# Patient Record
Sex: Female | Born: 1960
Health system: Southern US, Community
[De-identification: ages and names within clinical notes are randomized; demographics above are authoritative.]

## PROBLEM LIST (undated history)

## (undated) DIAGNOSIS — R569 Unspecified convulsions: Secondary | ICD-10-CM

## (undated) DIAGNOSIS — J449 Chronic obstructive pulmonary disease, unspecified: Secondary | ICD-10-CM

## (undated) DIAGNOSIS — F419 Anxiety disorder, unspecified: Secondary | ICD-10-CM

## (undated) DIAGNOSIS — E669 Obesity, unspecified: Secondary | ICD-10-CM

## (undated) DIAGNOSIS — Z9889 Other specified postprocedural states: Secondary | ICD-10-CM

## (undated) DIAGNOSIS — F32A Depression, unspecified: Secondary | ICD-10-CM

## (undated) DIAGNOSIS — Z96652 Presence of left artificial knee joint: Secondary | ICD-10-CM

## (undated) DIAGNOSIS — K631 Perforation of intestine (nontraumatic): Secondary | ICD-10-CM

## (undated) DIAGNOSIS — G8929 Other chronic pain: Secondary | ICD-10-CM

## (undated) DIAGNOSIS — K219 Gastro-esophageal reflux disease without esophagitis: Secondary | ICD-10-CM

## (undated) DIAGNOSIS — G4730 Sleep apnea, unspecified: Secondary | ICD-10-CM

## (undated) DIAGNOSIS — E7800 Pure hypercholesterolemia, unspecified: Secondary | ICD-10-CM

## (undated) DIAGNOSIS — R0602 Shortness of breath: Secondary | ICD-10-CM

## (undated) DIAGNOSIS — K7460 Unspecified cirrhosis of liver: Secondary | ICD-10-CM

## (undated) DIAGNOSIS — K5901 Slow transit constipation: Secondary | ICD-10-CM

## (undated) DIAGNOSIS — R51 Headache: Secondary | ICD-10-CM

## (undated) DIAGNOSIS — Z79899 Other long term (current) drug therapy: Secondary | ICD-10-CM

## (undated) DIAGNOSIS — J441 Chronic obstructive pulmonary disease with (acute) exacerbation: Secondary | ICD-10-CM

## (undated) DIAGNOSIS — I152 Hypertension secondary to endocrine disorders: Secondary | ICD-10-CM

## (undated) DIAGNOSIS — Z720 Tobacco use: Secondary | ICD-10-CM

## (undated) DIAGNOSIS — I10 Essential (primary) hypertension: Secondary | ICD-10-CM

## (undated) DIAGNOSIS — M4310 Spondylolisthesis, site unspecified: Secondary | ICD-10-CM

## (undated) DIAGNOSIS — E1169 Type 2 diabetes mellitus with other specified complication: Secondary | ICD-10-CM

## (undated) DIAGNOSIS — J181 Lobar pneumonia, unspecified organism: Secondary | ICD-10-CM

## (undated) DIAGNOSIS — J45909 Unspecified asthma, uncomplicated: Secondary | ICD-10-CM

## (undated) DIAGNOSIS — D509 Iron deficiency anemia, unspecified: Secondary | ICD-10-CM

## (undated) DIAGNOSIS — J101 Influenza due to other identified influenza virus with other respiratory manifestations: Secondary | ICD-10-CM

## (undated) DIAGNOSIS — J9621 Acute and chronic respiratory failure with hypoxia: Secondary | ICD-10-CM

## (undated) DIAGNOSIS — M1990 Unspecified osteoarthritis, unspecified site: Secondary | ICD-10-CM

## (undated) DIAGNOSIS — G4733 Obstructive sleep apnea (adult) (pediatric): Secondary | ICD-10-CM

## (undated) DIAGNOSIS — M5442 Lumbago with sciatica, left side: Secondary | ICD-10-CM

## (undated) DIAGNOSIS — R112 Nausea with vomiting, unspecified: Secondary | ICD-10-CM

## (undated) DIAGNOSIS — F329 Major depressive disorder, single episode, unspecified: Secondary | ICD-10-CM

## (undated) DIAGNOSIS — K567 Ileus, unspecified: Secondary | ICD-10-CM

## (undated) DIAGNOSIS — R1084 Generalized abdominal pain: Secondary | ICD-10-CM

## (undated) DIAGNOSIS — T8131XA Disruption of external operation (surgical) wound, not elsewhere classified, initial encounter: Secondary | ICD-10-CM

## (undated) DIAGNOSIS — J342 Deviated nasal septum: Secondary | ICD-10-CM

## (undated) DIAGNOSIS — E1159 Type 2 diabetes mellitus with other circulatory complications: Secondary | ICD-10-CM

## (undated) DIAGNOSIS — G43909 Migraine, unspecified, not intractable, without status migrainosus: Secondary | ICD-10-CM

## (undated) DIAGNOSIS — R188 Other ascites: Secondary | ICD-10-CM

## (undated) DIAGNOSIS — E119 Type 2 diabetes mellitus without complications: Secondary | ICD-10-CM

## (undated) DIAGNOSIS — R109 Unspecified abdominal pain: Secondary | ICD-10-CM

## (undated) DIAGNOSIS — J9620 Acute and chronic respiratory failure, unspecified whether with hypoxia or hypercapnia: Secondary | ICD-10-CM

## (undated) DIAGNOSIS — F418 Other specified anxiety disorders: Secondary | ICD-10-CM

## (undated) DIAGNOSIS — M4300 Spondylolysis, site unspecified: Secondary | ICD-10-CM

## (undated) DIAGNOSIS — I2510 Atherosclerotic heart disease of native coronary artery without angina pectoris: Secondary | ICD-10-CM

## (undated) DIAGNOSIS — E785 Hyperlipidemia, unspecified: Secondary | ICD-10-CM

## (undated) DIAGNOSIS — F1190 Opioid use, unspecified, uncomplicated: Secondary | ICD-10-CM

## (undated) HISTORY — DX: Essential (primary) hypertension: I10

## (undated) HISTORY — DX: Gastro-esophageal reflux disease without esophagitis: K21.9

## (undated) HISTORY — PX: ABDOMINAL EXPLORATION SURGERY: SHX538

## (undated) HISTORY — PX: OTHER SURGICAL HISTORY: SHX169

## (undated) HISTORY — DX: Lumbago with sciatica, left side: M54.42

## (undated) HISTORY — DX: Tobacco use: Z72.0

## (undated) HISTORY — DX: Obesity, unspecified: E66.9

## (undated) HISTORY — PX: ABDOMINAL HYSTERECTOMY: SHX81

## (undated) HISTORY — PX: BACK SURGERY: SHX140

## (undated) HISTORY — DX: Other chronic pain: G89.29

## (undated) HISTORY — DX: Atherosclerotic heart disease of native coronary artery without angina pectoris: I25.10

## (undated) HISTORY — DX: Chronic obstructive pulmonary disease, unspecified: J44.9

---
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
## 1999-10-29 ENCOUNTER — Ambulatory Visit: Admission: RE | Admit: 1999-10-29 | Discharge: 1999-10-29 | Payer: Self-pay | Admitting: Family Medicine

## 2000-02-06 ENCOUNTER — Ambulatory Visit (HOSPITAL_COMMUNITY): Admission: RE | Admit: 2000-02-06 | Discharge: 2000-02-06 | Payer: Self-pay | Admitting: *Deleted

## 2000-02-06 ENCOUNTER — Encounter: Payer: Self-pay | Admitting: *Deleted

## 2000-10-13 ENCOUNTER — Encounter: Payer: Self-pay | Admitting: Specialist

## 2000-10-14 ENCOUNTER — Observation Stay (HOSPITAL_COMMUNITY): Admission: RE | Admit: 2000-10-14 | Discharge: 2000-10-15 | Payer: Self-pay | Admitting: Specialist

## 2000-10-14 ENCOUNTER — Encounter: Payer: Self-pay | Admitting: Specialist

## 2000-10-14 ENCOUNTER — Encounter (INDEPENDENT_AMBULATORY_CARE_PROVIDER_SITE_OTHER): Payer: Self-pay | Admitting: Specialist

## 2000-12-07 ENCOUNTER — Encounter: Admission: RE | Admit: 2000-12-07 | Discharge: 2000-12-07 | Payer: Self-pay | Admitting: Obstetrics and Gynecology

## 2000-12-07 ENCOUNTER — Encounter: Payer: Self-pay | Admitting: Obstetrics and Gynecology

## 2001-05-30 ENCOUNTER — Encounter: Payer: Self-pay | Admitting: Emergency Medicine

## 2001-05-30 ENCOUNTER — Emergency Department (HOSPITAL_COMMUNITY): Admission: EM | Admit: 2001-05-30 | Discharge: 2001-05-31 | Payer: Self-pay | Admitting: Emergency Medicine

## 2001-07-02 ENCOUNTER — Encounter: Payer: Self-pay | Admitting: Surgery

## 2001-07-02 ENCOUNTER — Encounter: Admission: RE | Admit: 2001-07-02 | Discharge: 2001-07-02 | Payer: Self-pay | Admitting: Surgery

## 2001-08-19 ENCOUNTER — Ambulatory Visit (HOSPITAL_COMMUNITY): Admission: RE | Admit: 2001-08-19 | Discharge: 2001-08-19 | Payer: Self-pay | Admitting: Gastroenterology

## 2001-08-27 ENCOUNTER — Emergency Department (HOSPITAL_COMMUNITY): Admission: EM | Admit: 2001-08-27 | Discharge: 2001-08-27 | Payer: Self-pay | Admitting: Emergency Medicine

## 2001-08-27 ENCOUNTER — Encounter: Payer: Self-pay | Admitting: Emergency Medicine

## 2001-09-02 ENCOUNTER — Encounter: Admission: RE | Admit: 2001-09-02 | Discharge: 2001-09-02 | Payer: Self-pay | Admitting: Gastroenterology

## 2001-09-02 ENCOUNTER — Encounter: Payer: Self-pay | Admitting: Gastroenterology

## 2001-10-01 ENCOUNTER — Encounter: Admission: RE | Admit: 2001-10-01 | Discharge: 2001-10-01 | Payer: Self-pay | Admitting: Surgery

## 2001-10-01 ENCOUNTER — Encounter: Payer: Self-pay | Admitting: Surgery

## 2002-02-01 ENCOUNTER — Ambulatory Visit (HOSPITAL_COMMUNITY): Admission: RE | Admit: 2002-02-01 | Discharge: 2002-02-01 | Payer: Self-pay | Admitting: *Deleted

## 2002-02-01 ENCOUNTER — Encounter: Payer: Self-pay | Admitting: *Deleted

## 2002-02-10 ENCOUNTER — Encounter: Payer: Self-pay | Admitting: Family Medicine

## 2002-02-10 ENCOUNTER — Ambulatory Visit (HOSPITAL_COMMUNITY): Admission: RE | Admit: 2002-02-10 | Discharge: 2002-02-10 | Payer: Self-pay | Admitting: Family Medicine

## 2003-08-31 ENCOUNTER — Ambulatory Visit (HOSPITAL_COMMUNITY): Admission: RE | Admit: 2003-08-31 | Discharge: 2003-08-31 | Payer: Self-pay | Admitting: Family Medicine

## 2003-09-18 ENCOUNTER — Emergency Department (HOSPITAL_COMMUNITY): Admission: EM | Admit: 2003-09-18 | Discharge: 2003-09-18 | Payer: Self-pay | Admitting: Emergency Medicine

## 2003-09-22 ENCOUNTER — Ambulatory Visit (HOSPITAL_COMMUNITY): Admission: RE | Admit: 2003-09-22 | Discharge: 2003-09-22 | Payer: Self-pay | Admitting: Gastroenterology

## 2004-05-08 ENCOUNTER — Other Ambulatory Visit: Admission: RE | Admit: 2004-05-08 | Discharge: 2004-05-08 | Payer: Self-pay | Admitting: Family Medicine

## 2005-09-11 ENCOUNTER — Emergency Department (HOSPITAL_COMMUNITY): Admission: EM | Admit: 2005-09-11 | Discharge: 2005-09-11 | Payer: Self-pay | Admitting: Emergency Medicine

## 2005-10-12 ENCOUNTER — Emergency Department (HOSPITAL_COMMUNITY): Admission: EM | Admit: 2005-10-12 | Discharge: 2005-10-12 | Payer: Self-pay | Admitting: Family Medicine

## 2006-08-15 ENCOUNTER — Emergency Department (HOSPITAL_COMMUNITY): Admission: EM | Admit: 2006-08-15 | Discharge: 2006-08-15 | Payer: Self-pay | Admitting: Family Medicine

## 2006-08-16 ENCOUNTER — Emergency Department (HOSPITAL_COMMUNITY): Admission: EM | Admit: 2006-08-16 | Discharge: 2006-08-16 | Payer: Self-pay | Admitting: Emergency Medicine

## 2006-08-20 ENCOUNTER — Emergency Department (HOSPITAL_COMMUNITY): Admission: EM | Admit: 2006-08-20 | Discharge: 2006-08-20 | Payer: Self-pay | Admitting: Family Medicine

## 2006-09-27 ENCOUNTER — Emergency Department (HOSPITAL_COMMUNITY): Admission: EM | Admit: 2006-09-27 | Discharge: 2006-09-27 | Payer: Self-pay | Admitting: Emergency Medicine

## 2006-10-31 ENCOUNTER — Emergency Department (HOSPITAL_COMMUNITY): Admission: EM | Admit: 2006-10-31 | Discharge: 2006-10-31 | Payer: Self-pay | Admitting: Family Medicine

## 2006-11-07 ENCOUNTER — Emergency Department (HOSPITAL_COMMUNITY): Admission: EM | Admit: 2006-11-07 | Discharge: 2006-11-07 | Payer: Self-pay | Admitting: Emergency Medicine

## 2006-12-16 ENCOUNTER — Ambulatory Visit (HOSPITAL_COMMUNITY): Admission: RE | Admit: 2006-12-16 | Discharge: 2006-12-16 | Payer: Self-pay | Admitting: *Deleted

## 2007-02-18 ENCOUNTER — Emergency Department (HOSPITAL_COMMUNITY): Admission: EM | Admit: 2007-02-18 | Discharge: 2007-02-18 | Payer: Self-pay | Admitting: Emergency Medicine

## 2007-05-11 ENCOUNTER — Ambulatory Visit: Payer: Self-pay | Admitting: Physical Medicine and Rehabilitation

## 2007-05-11 ENCOUNTER — Encounter
Admission: RE | Admit: 2007-05-11 | Discharge: 2007-05-12 | Payer: Self-pay | Admitting: Physical Medicine and Rehabilitation

## 2007-05-12 ENCOUNTER — Emergency Department (HOSPITAL_COMMUNITY): Admission: EM | Admit: 2007-05-12 | Discharge: 2007-05-12 | Payer: Self-pay | Admitting: Emergency Medicine

## 2007-05-25 ENCOUNTER — Emergency Department (HOSPITAL_COMMUNITY): Admission: EM | Admit: 2007-05-25 | Discharge: 2007-05-25 | Payer: Self-pay | Admitting: Family Medicine

## 2007-09-05 ENCOUNTER — Emergency Department (HOSPITAL_COMMUNITY): Admission: EM | Admit: 2007-09-05 | Discharge: 2007-09-05 | Payer: Self-pay | Admitting: Emergency Medicine

## 2008-04-03 ENCOUNTER — Emergency Department (HOSPITAL_COMMUNITY): Admission: EM | Admit: 2008-04-03 | Discharge: 2008-04-04 | Payer: Self-pay | Admitting: Emergency Medicine

## 2008-04-16 ENCOUNTER — Emergency Department (HOSPITAL_COMMUNITY): Admission: EM | Admit: 2008-04-16 | Discharge: 2008-04-16 | Payer: Self-pay | Admitting: Family Medicine

## 2008-04-30 ENCOUNTER — Inpatient Hospital Stay (HOSPITAL_COMMUNITY): Admission: EM | Admit: 2008-04-30 | Discharge: 2008-05-03 | Payer: Self-pay | Admitting: Emergency Medicine

## 2008-05-12 ENCOUNTER — Ambulatory Visit (HOSPITAL_COMMUNITY): Admission: RE | Admit: 2008-05-12 | Discharge: 2008-05-12 | Payer: Self-pay | Admitting: Gastroenterology

## 2008-05-30 ENCOUNTER — Emergency Department (HOSPITAL_COMMUNITY): Admission: EM | Admit: 2008-05-30 | Discharge: 2008-05-30 | Payer: Self-pay | Admitting: Internal Medicine

## 2008-06-02 ENCOUNTER — Emergency Department (HOSPITAL_COMMUNITY): Admission: EM | Admit: 2008-06-02 | Discharge: 2008-06-02 | Payer: Self-pay | Admitting: Emergency Medicine

## 2008-06-15 ENCOUNTER — Encounter (INDEPENDENT_AMBULATORY_CARE_PROVIDER_SITE_OTHER): Payer: Self-pay | Admitting: Gastroenterology

## 2008-06-15 ENCOUNTER — Ambulatory Visit (HOSPITAL_COMMUNITY): Admission: RE | Admit: 2008-06-15 | Discharge: 2008-06-15 | Payer: Self-pay | Admitting: Gastroenterology

## 2008-08-23 ENCOUNTER — Emergency Department (HOSPITAL_COMMUNITY): Admission: EM | Admit: 2008-08-23 | Discharge: 2008-08-24 | Payer: Self-pay | Admitting: Emergency Medicine

## 2008-08-28 ENCOUNTER — Emergency Department (HOSPITAL_COMMUNITY): Admission: EM | Admit: 2008-08-28 | Discharge: 2008-08-29 | Payer: Self-pay | Admitting: Emergency Medicine

## 2008-10-20 ENCOUNTER — Emergency Department (HOSPITAL_COMMUNITY): Admission: EM | Admit: 2008-10-20 | Discharge: 2008-10-20 | Payer: Self-pay | Admitting: Emergency Medicine

## 2008-11-24 ENCOUNTER — Emergency Department (HOSPITAL_COMMUNITY): Admission: EM | Admit: 2008-11-24 | Discharge: 2008-11-24 | Payer: Self-pay | Admitting: Emergency Medicine

## 2009-01-05 ENCOUNTER — Inpatient Hospital Stay (HOSPITAL_COMMUNITY): Admission: EM | Admit: 2009-01-05 | Discharge: 2009-01-06 | Payer: Self-pay | Admitting: Emergency Medicine

## 2009-01-15 ENCOUNTER — Inpatient Hospital Stay (HOSPITAL_BASED_OUTPATIENT_CLINIC_OR_DEPARTMENT_OTHER): Admission: RE | Admit: 2009-01-15 | Discharge: 2009-01-15 | Payer: Self-pay | Admitting: Cardiology

## 2009-01-24 ENCOUNTER — Inpatient Hospital Stay (HOSPITAL_COMMUNITY): Admission: AD | Admit: 2009-01-24 | Discharge: 2009-01-25 | Payer: Self-pay | Admitting: Interventional Cardiology

## 2009-03-10 ENCOUNTER — Emergency Department (HOSPITAL_COMMUNITY): Admission: EM | Admit: 2009-03-10 | Discharge: 2009-03-10 | Payer: Self-pay | Admitting: Emergency Medicine

## 2009-03-29 ENCOUNTER — Emergency Department (HOSPITAL_COMMUNITY): Admission: EM | Admit: 2009-03-29 | Discharge: 2009-03-29 | Payer: Self-pay | Admitting: Emergency Medicine

## 2009-04-25 ENCOUNTER — Emergency Department (HOSPITAL_COMMUNITY): Admission: EM | Admit: 2009-04-25 | Discharge: 2009-04-25 | Payer: Self-pay | Admitting: Emergency Medicine

## 2009-05-17 ENCOUNTER — Emergency Department (HOSPITAL_COMMUNITY): Admission: EM | Admit: 2009-05-17 | Discharge: 2009-05-17 | Payer: Self-pay | Admitting: Emergency Medicine

## 2009-06-26 ENCOUNTER — Emergency Department (HOSPITAL_COMMUNITY): Admission: EM | Admit: 2009-06-26 | Discharge: 2009-06-26 | Payer: Self-pay | Admitting: Emergency Medicine

## 2009-06-29 ENCOUNTER — Emergency Department (HOSPITAL_COMMUNITY): Admission: EM | Admit: 2009-06-29 | Discharge: 2009-06-29 | Payer: Self-pay | Admitting: Emergency Medicine

## 2009-07-01 ENCOUNTER — Emergency Department (HOSPITAL_COMMUNITY): Admission: EM | Admit: 2009-07-01 | Discharge: 2009-07-01 | Payer: Self-pay | Admitting: Emergency Medicine

## 2009-07-02 ENCOUNTER — Emergency Department (HOSPITAL_COMMUNITY): Admission: EM | Admit: 2009-07-02 | Discharge: 2009-07-02 | Payer: Self-pay | Admitting: Emergency Medicine

## 2009-07-11 ENCOUNTER — Encounter: Admission: RE | Admit: 2009-07-11 | Discharge: 2009-08-27 | Payer: Self-pay | Admitting: Family Medicine

## 2009-07-16 ENCOUNTER — Emergency Department (HOSPITAL_COMMUNITY): Admission: EM | Admit: 2009-07-16 | Discharge: 2009-07-16 | Payer: Self-pay | Admitting: Emergency Medicine

## 2009-07-23 ENCOUNTER — Emergency Department (HOSPITAL_COMMUNITY): Admission: EM | Admit: 2009-07-23 | Discharge: 2009-07-23 | Payer: Self-pay | Admitting: Emergency Medicine

## 2009-07-29 ENCOUNTER — Emergency Department (HOSPITAL_COMMUNITY): Admission: EM | Admit: 2009-07-29 | Discharge: 2009-07-29 | Payer: Self-pay | Admitting: Emergency Medicine

## 2009-09-04 ENCOUNTER — Emergency Department (HOSPITAL_COMMUNITY): Admission: EM | Admit: 2009-09-04 | Discharge: 2009-09-04 | Payer: Self-pay | Admitting: Emergency Medicine

## 2010-04-08 ENCOUNTER — Emergency Department (HOSPITAL_COMMUNITY): Admission: EM | Admit: 2010-04-08 | Discharge: 2010-04-08 | Payer: Self-pay | Admitting: Emergency Medicine

## 2010-07-19 ENCOUNTER — Inpatient Hospital Stay (HOSPITAL_COMMUNITY)
Admission: EM | Admit: 2010-07-19 | Discharge: 2010-07-21 | Payer: Self-pay | Source: Home / Self Care | Admitting: Emergency Medicine

## 2010-08-21 ENCOUNTER — Encounter
Admission: RE | Admit: 2010-08-21 | Discharge: 2010-08-21 | Payer: Self-pay | Source: Home / Self Care | Admitting: Family Medicine

## 2010-08-27 ENCOUNTER — Encounter
Admission: RE | Admit: 2010-08-27 | Discharge: 2010-08-27 | Payer: Self-pay | Source: Home / Self Care | Attending: Family Medicine | Admitting: Family Medicine

## 2010-09-24 ENCOUNTER — Ambulatory Visit (HOSPITAL_COMMUNITY)
Admission: RE | Admit: 2010-09-24 | Discharge: 2010-09-24 | Payer: Self-pay | Source: Home / Self Care | Attending: Orthopedic Surgery | Admitting: Orthopedic Surgery

## 2010-11-14 ENCOUNTER — Emergency Department (HOSPITAL_COMMUNITY): Payer: Medicare Other

## 2010-11-14 ENCOUNTER — Emergency Department (HOSPITAL_COMMUNITY)
Admission: EM | Admit: 2010-11-14 | Discharge: 2010-11-14 | Disposition: A | Discharge status: Home / Self Care | Payer: Medicare Other | Attending: Emergency Medicine | Admitting: Emergency Medicine

## 2010-11-14 DIAGNOSIS — R112 Nausea with vomiting, unspecified: Secondary | ICD-10-CM | POA: Insufficient documentation

## 2010-11-14 DIAGNOSIS — R630 Anorexia: Secondary | ICD-10-CM | POA: Insufficient documentation

## 2010-11-14 DIAGNOSIS — R5383 Other fatigue: Secondary | ICD-10-CM | POA: Insufficient documentation

## 2010-11-14 DIAGNOSIS — K219 Gastro-esophageal reflux disease without esophagitis: Secondary | ICD-10-CM | POA: Insufficient documentation

## 2010-11-14 DIAGNOSIS — Z7982 Long term (current) use of aspirin: Secondary | ICD-10-CM | POA: Insufficient documentation

## 2010-11-14 DIAGNOSIS — R5381 Other malaise: Secondary | ICD-10-CM | POA: Insufficient documentation

## 2010-11-14 DIAGNOSIS — I10 Essential (primary) hypertension: Secondary | ICD-10-CM | POA: Insufficient documentation

## 2010-11-14 DIAGNOSIS — R062 Wheezing: Secondary | ICD-10-CM | POA: Insufficient documentation

## 2010-11-14 DIAGNOSIS — M549 Dorsalgia, unspecified: Secondary | ICD-10-CM | POA: Insufficient documentation

## 2010-11-14 DIAGNOSIS — M25569 Pain in unspecified knee: Secondary | ICD-10-CM | POA: Insufficient documentation

## 2010-11-14 DIAGNOSIS — R1084 Generalized abdominal pain: Secondary | ICD-10-CM | POA: Insufficient documentation

## 2010-11-14 DIAGNOSIS — Z86718 Personal history of other venous thrombosis and embolism: Secondary | ICD-10-CM | POA: Insufficient documentation

## 2010-11-14 DIAGNOSIS — Z79899 Other long term (current) drug therapy: Secondary | ICD-10-CM | POA: Insufficient documentation

## 2010-11-14 DIAGNOSIS — G8929 Other chronic pain: Secondary | ICD-10-CM | POA: Insufficient documentation

## 2010-11-14 LAB — DIFFERENTIAL
Basophils Absolute: 0 10*3/uL (ref 0.0–0.1)
Eosinophils Absolute: 0.2 10*3/uL (ref 0.0–0.7)
Eosinophils Relative: 2 % (ref 0–5)
Lymphocytes Relative: 34 % (ref 12–46)

## 2010-11-14 LAB — URINALYSIS, ROUTINE W REFLEX MICROSCOPIC
Ketones, ur: NEGATIVE mg/dL
Nitrite: NEGATIVE
Protein, ur: NEGATIVE mg/dL
pH: 6.5 (ref 5.0–8.0)

## 2010-11-14 LAB — COMPREHENSIVE METABOLIC PANEL
ALT: 13 U/L (ref 0–35)
Albumin: 3 g/dL — ABNORMAL LOW (ref 3.5–5.2)
Alkaline Phosphatase: 75 U/L (ref 39–117)
Potassium: 3.8 mEq/L (ref 3.5–5.1)
Sodium: 143 mEq/L (ref 135–145)
Total Protein: 6.8 g/dL (ref 6.0–8.3)

## 2010-11-14 LAB — CBC
Platelets: 197 10*3/uL (ref 150–400)
RDW: 14.1 % (ref 11.5–15.5)
WBC: 9.7 10*3/uL (ref 4.0–10.5)

## 2010-11-14 LAB — PHOSPHORUS: Phosphorus: 3.6 mg/dL (ref 2.3–4.6)

## 2010-11-16 ENCOUNTER — Inpatient Hospital Stay (HOSPITAL_COMMUNITY): Payer: Medicare Other

## 2010-11-16 ENCOUNTER — Emergency Department (HOSPITAL_COMMUNITY): Payer: Medicare Other

## 2010-11-16 ENCOUNTER — Inpatient Hospital Stay (HOSPITAL_COMMUNITY)
Admission: EM | Admit: 2010-11-16 | Discharge: 2010-11-18 | DRG: 189 | Disposition: A | Discharge status: Home / Self Care | Payer: Medicare Other | Source: Ambulatory Visit | Attending: Internal Medicine | Admitting: Internal Medicine

## 2010-11-16 DIAGNOSIS — I2510 Atherosclerotic heart disease of native coronary artery without angina pectoris: Secondary | ICD-10-CM | POA: Diagnosis present

## 2010-11-16 DIAGNOSIS — I10 Essential (primary) hypertension: Secondary | ICD-10-CM | POA: Diagnosis present

## 2010-11-16 DIAGNOSIS — K5289 Other specified noninfective gastroenteritis and colitis: Secondary | ICD-10-CM | POA: Diagnosis present

## 2010-11-16 DIAGNOSIS — R51 Headache: Secondary | ICD-10-CM | POA: Diagnosis present

## 2010-11-16 DIAGNOSIS — G8929 Other chronic pain: Secondary | ICD-10-CM | POA: Diagnosis present

## 2010-11-16 DIAGNOSIS — J441 Chronic obstructive pulmonary disease with (acute) exacerbation: Secondary | ICD-10-CM | POA: Diagnosis present

## 2010-11-16 DIAGNOSIS — J9600 Acute respiratory failure, unspecified whether with hypoxia or hypercapnia: Principal | ICD-10-CM | POA: Diagnosis present

## 2010-11-16 DIAGNOSIS — I509 Heart failure, unspecified: Secondary | ICD-10-CM | POA: Diagnosis present

## 2010-11-16 DIAGNOSIS — N390 Urinary tract infection, site not specified: Secondary | ICD-10-CM | POA: Diagnosis present

## 2010-11-16 DIAGNOSIS — I5033 Acute on chronic diastolic (congestive) heart failure: Secondary | ICD-10-CM | POA: Diagnosis present

## 2010-11-16 DIAGNOSIS — F17200 Nicotine dependence, unspecified, uncomplicated: Secondary | ICD-10-CM | POA: Diagnosis present

## 2010-11-16 LAB — COMPREHENSIVE METABOLIC PANEL
ALT: 12 U/L (ref 0–35)
AST: 32 U/L (ref 0–37)
Albumin: 2.8 g/dL — ABNORMAL LOW (ref 3.5–5.2)
Alkaline Phosphatase: 68 U/L (ref 39–117)
GFR calc Af Amer: >60 mL/min (ref >60)
Glucose, Bld: 130 mg/dL — ABNORMAL HIGH (ref 70–99)
Potassium: 3.9 mEq/L (ref 3.5–5.1)
Sodium: 142 mEq/L (ref 135–145)
Total Protein: 6.8 g/dL (ref 6.0–8.3)

## 2010-11-16 LAB — POCT CARDIAC MARKERS
CKMB, poc: <1 ng/mL — ABNORMAL LOW (ref 1.0–8.0)
Troponin i, poc: <0.05 ng/mL (ref 0.00–0.09)

## 2010-11-16 LAB — POCT I-STAT 3, ART BLOOD GAS (G3+)
Acid-Base Excess: 6 mmol/L — ABNORMAL HIGH (ref 0.0–2.0)
Bicarbonate: 36.3 mEq/L — ABNORMAL HIGH (ref 20.0–24.0)
O2 Saturation: 92 %
TCO2: 39 mmol/L (ref 0–100)

## 2010-11-16 LAB — CK TOTAL AND CKMB (NOT AT ARMC)
Relative Index: INVALID (ref 0.0–2.5)
Relative Index: INVALID (ref 0.0–2.5)
Total CK: 66 U/L (ref 7–177)

## 2010-11-16 LAB — CBC
HCT: 44.5 % (ref 36.0–46.0)
RDW: 14.6 % (ref 11.5–15.5)
WBC: 10 10*3/uL (ref 4.0–10.5)

## 2010-11-16 LAB — TSH: TSH: 0.494 u[IU]/mL (ref 0.350–4.500)

## 2010-11-16 LAB — DIFFERENTIAL
Basophils Absolute: 0 10*3/uL (ref 0.0–0.1)
Eosinophils Relative: 1 % (ref 0–5)
Lymphocytes Relative: 31 % (ref 12–46)
Neutro Abs: 6.4 10*3/uL (ref 1.7–7.7)

## 2010-11-16 LAB — LIPASE, BLOOD: Lipase: 31 U/L (ref 11–59)

## 2010-11-16 LAB — BRAIN NATRIURETIC PEPTIDE: Pro B Natriuretic peptide (BNP): 99 pg/mL (ref 0.0–100.0)

## 2010-11-16 LAB — TROPONIN I: Troponin I: 0.02 ng/mL (ref 0.00–0.06)

## 2010-11-16 MED ORDER — IOHEXOL 300 MG/ML  SOLN: 100.0000 mL | Freq: Once | INTRAMUSCULAR | Status: AC | PRN

## 2010-11-16 MED ORDER — IOHEXOL 300 MG/ML  SOLN
125.0000 mL | Freq: Once | INTRAMUSCULAR | Status: AC | PRN
  Administered 2010-11-16: 125 mL via INTRAVENOUS

## 2010-11-17 DIAGNOSIS — J9600 Acute respiratory failure, unspecified whether with hypoxia or hypercapnia: Secondary | ICD-10-CM

## 2010-11-17 DIAGNOSIS — E872 Acidosis: Secondary | ICD-10-CM

## 2010-11-17 DIAGNOSIS — R0902 Hypoxemia: Secondary | ICD-10-CM

## 2010-11-17 DIAGNOSIS — J441 Chronic obstructive pulmonary disease with (acute) exacerbation: Secondary | ICD-10-CM

## 2010-11-17 LAB — LIPID PANEL
Cholesterol: 178 mg/dL (ref 0–200)
LDL Cholesterol: 121 mg/dL — ABNORMAL HIGH (ref 0–99)
Triglycerides: 148 mg/dL (ref <150)

## 2010-11-17 LAB — CBC
HCT: 45.9 % (ref 36.0–46.0)
Hemoglobin: 14.7 g/dL (ref 12.0–15.0)
MCV: 93.5 fL (ref 78.0–100.0)
WBC: 8.8 10*3/uL (ref 4.0–10.5)

## 2010-11-17 LAB — CK TOTAL AND CKMB (NOT AT ARMC)
CK, MB: 1.8 ng/mL (ref 0.3–4.0)
Total CK: 75 U/L (ref 7–177)

## 2010-11-17 LAB — TROPONIN I: Troponin I: 0.02 ng/mL (ref 0.00–0.06)

## 2010-11-21 NOTE — Discharge Summary (Signed)
  NAMERUBYE, STROHMEYER                  ACCOUNT NO.:  [id number]  MEDICAL RECORD NO.:  [id number]           PATIENT TYPE:  I  LOCATION:  9030                         FACILITY:  [facility]  PHYSICIAN:  Derrill Kay, MD       DATE OF BIRTH:  1961/08/06  DATE OF ADMISSION:  11/16/2010 DATE OF DISCHARGE:  11/18/2010                              DISCHARGE SUMMARY   ADDENDUM  The patient does not have chronic kidney disease.  Her baseline creatinine function is normal.          ______________________________ Derrill Kay, MD     RD/MEDQ  D:  11/18/2010  T:  11/18/2010  Job:  [id number]  Electronically Signed by Steward Ros MD on 11/19/2010 08:03:05 PM

## 2010-11-25 NOTE — Discharge Summary (Signed)
[name], [name]                  ACCOUNT NO.:  [id number]  MEDICAL RECORD NO.:  [id number]           PATIENT TYPE:  LOCATION:                                 FACILITY:  PHYSICIAN:  Derrill Kay, MD       DATE OF BIRTH:  1961-01-29  DATE OF ADMISSION: DATE OF DISCHARGE:                              DISCHARGE SUMMARY   DISCHARGE DIAGNOSES: 1. Acute-on-chronic respiratory failure. 2. Acute-on-chronic diastolic congestive heart failure. 3. Mild chronic obstructive pulmonary disease exacerbation. 4. Nausea and vomiting, caused by gastroenteritis, resolved. 5. Chronic kidney disease. 6. Morbid obesity with likely obstructive sleep apnea, needs an     outpatient sleep study. 7. Chronic pain syndrome. 8. Nonobstructive coronary artery disease. 9. Tobacco abuse. 10.Hypertension, uncontrolled, secondary to nausea and vomiting.  HOSPITAL COURSE:  [name] is a 50 year old female with multiple medical problems who presented to emergency room with shortness of breath, was found to be in a combination of heart failure and COPD, was admitted due to acute hypercapnic respiratory failure and needed BiPAP temporarily. It was thought that the majority of her respiratory worsening was secondary to narcotic use by Pulmonology.  It was recommended to minimize narcotics in the future; however, this will be difficult due to her chronic back pain.  She probably has underlying obstructive sleep apnea.  She needs outpatient sleep study to be evaluated to see if she needs CPAP.  She was weaned off the BiPAP and at the time of discharge, she was on room air with good O2 sats above 90% on room air.  Another issue was she presented with gastroenteritis, this has resolved.  Her blood pressure was uncontrolled as she was not able to tolerate her p.o. medications.  She was also found to have a UTI, for which ciprofloxacin was started.  She is being discharged home in stable condition to follow up with her  primary care physician in 1 week, again needs to be arranged for outpatient sleep study, and she is also to follow up with Pulmonology in approximately 4 weeks.  There have been no changes to her home medication regimen except the addition of ciprofloxacin for 3-day course for UTI.  She was tolerating a regular diet at the time of discharge and was back to her baseline respiratory status.  She is to continue her home medications.  She has got chronic kidney disease. This was stable with a creatinine of around 3.1.  She is to maintain followup as an outpatient again for monitoring of her renal function.  PHYSICAL EXAMINATION:  VITAL SIGNS:  She has been afebrile.  Vital signs stable. GENERAL:  Alert and oriented x4, in no apparent distress, cooperative, friendly, obese. CARDIAC:  Regular rate and rhythm without murmurs, rubs, or gallops. CHEST:  Clear to auscultation bilaterally.  No wheeze or rales. ABDOMEN:  Soft, nontender, nondistended, positive bowel sounds.  No hepatosplenomegaly. EXTREMITIES:  Without clubbing, cyanosis, or edema. PSYCH:  Normal mood and affect. NEUROLOGIC:  No focal neurological deficits. SKIN:  No rashes.  DISCHARGE MEDICATIONS: 1. Ciprofloxacin 500 mg twice a day for a 3-day course.  2. Continue Anusol cream as needed for rectal pain at home. 3. Cyclobenzaprine 10 mg twice a day. 4. Estradiol 2 mg daily. 5. Flonase nasal spray 2 sprays daily. 6. Lasix 80 mg daily. 7. Lidoderm patch q.12 h. as needed. 8. Meloxicam 15 mg daily. 9. Nexium 40 mg twice a day. 10.Opana ER 20 mg twice a day. 11.Oxycodone/acetaminophen 10/325 one tablet every 4 hours as needed     for pain. 12.Pataday ophthalmic drops to each eye daily. 13.Promethazine 25 mg every 4 hours as needed for nausea and vomiting. 14.Benicar 40 mg daily.  At followup appointment, she needs to have no further monitoring of her blood pressure and adjust medications as indicated.  Time at discharge  over 30 minutes.          ______________________________ Derrill Kay, MD     RD/MEDQ  D:  11/18/2010  T:  11/18/2010  Job:  [id number]  Electronically Signed by Steward Ros MD on 11/25/2010 06:43:34 PM

## 2010-11-26 LAB — POCT CARDIAC MARKERS
Myoglobin, poc: 59.7 ng/mL (ref 12–200)
Troponin i, poc: <0.05 ng/mL (ref 0.00–0.09)

## 2010-11-26 LAB — CBC
HCT: 45.1 % (ref 36.0–46.0)
Hemoglobin: 13.3 g/dL (ref 12.0–15.0)
MCH: 30.7 pg (ref 26.0–34.0)
MCHC: 31.7 g/dL (ref 30.0–36.0)
MCV: 97.2 fL (ref 78.0–100.0)
Platelets: 214 10*3/uL (ref 150–400)
RBC: 4.33 MIL/uL (ref 3.87–5.11)
RDW: 14.1 % (ref 11.5–15.5)

## 2010-11-26 LAB — DIFFERENTIAL
Basophils Absolute: 0 10*3/uL (ref 0.0–0.1)
Eosinophils Relative: 0 % (ref 0–5)
Lymphocytes Relative: 13 % (ref 12–46)
Lymphs Abs: 4.7 10*3/uL — ABNORMAL HIGH (ref 0.7–4.0)
Monocytes Relative: 6 % (ref 3–12)
Neutro Abs: 13.4 10*3/uL — ABNORMAL HIGH (ref 1.7–7.7)
Neutro Abs: 9.3 10*3/uL — ABNORMAL HIGH (ref 1.7–7.7)
Neutrophils Relative %: 61 % (ref 43–77)
Neutrophils Relative %: 86 % — ABNORMAL HIGH (ref 43–77)

## 2010-11-26 LAB — LIPID PANEL
Cholesterol: 167 mg/dL (ref 0–200)
LDL Cholesterol: 99 mg/dL (ref 0–99)
Total CHOL/HDL Ratio: 4.5 RATIO
VLDL: 28 mg/dL (ref 0–40)
VLDL: 50 mg/dL — ABNORMAL HIGH (ref 0–40)

## 2010-11-26 LAB — RAPID URINE DRUG SCREEN, HOSP PERFORMED
Amphetamines: NOT DETECTED
Barbiturates: NOT DETECTED
Benzodiazepines: POSITIVE — AB
Cocaine: NOT DETECTED
Tetrahydrocannabinol: NOT DETECTED

## 2010-11-26 LAB — COMPREHENSIVE METABOLIC PANEL
ALT: 21 U/L (ref 0–35)
BUN: 5 mg/dL — ABNORMAL LOW (ref 6–23)
CO2: 33 mEq/L — ABNORMAL HIGH (ref 19–32)
Calcium: 8.5 mg/dL (ref 8.4–10.5)
Chloride: 99 mEq/L (ref 96–112)
Creatinine, Ser: 0.68 mg/dL (ref 0.4–1.2)
GFR calc non Af Amer: >60 mL/min (ref >60)
Glucose, Bld: 140 mg/dL — ABNORMAL HIGH (ref 70–99)
Glucose, Bld: 178 mg/dL — ABNORMAL HIGH (ref 70–99)
Sodium: 138 mEq/L (ref 135–145)
Total Bilirubin: 0.6 mg/dL (ref 0.3–1.2)
Total Protein: 7.2 g/dL (ref 6.0–8.3)

## 2010-11-26 LAB — URINALYSIS, ROUTINE W REFLEX MICROSCOPIC
Bilirubin Urine: NEGATIVE
Glucose, UA: NEGATIVE mg/dL
Hgb urine dipstick: NEGATIVE
Nitrite: NEGATIVE
Specific Gravity, Urine: 1.018 (ref 1.005–1.030)
pH: 5.5 (ref 5.0–8.0)

## 2010-11-26 LAB — CULTURE, BLOOD (ROUTINE X 2)
Culture  Setup Time: 201111042149
Culture: NO GROWTH

## 2010-11-26 LAB — HEMOGLOBIN A1C: Hgb A1c MFr Bld: 6.2 % — ABNORMAL HIGH (ref <5.7)

## 2010-11-26 LAB — TSH: TSH: 0.365 u[IU]/mL (ref 0.350–4.500)

## 2010-11-26 LAB — D-DIMER, QUANTITATIVE: D-Dimer, Quant: 1.19 ug/mL-FEU — ABNORMAL HIGH (ref 0.00–0.48)

## 2010-11-26 NOTE — H&P (Signed)
[name], [name]                  ACCOUNT NO.:  [id number]  MEDICAL RECORD NO.:  [id number]           PATIENT TYPE:  E  LOCATION:  [facility]                         FACILITY:  [facility]  PHYSICIAN:  Kathie Dike, MD     DATE OF BIRTH:  07-17-1961  DATE OF ADMISSION:  11/16/2010 DATE OF DISCHARGE:                             HISTORY & PHYSICAL   PRIMARY CARE PHYSICIAN:  Azalia Bilis, MD from [facility] physicians.  CHIEF COMPLAINT:  Headache, nausea, vomiting.  HISTORY OF PRESENT ILLNESS:  This is a 50 year old female with past medical history of obesity, tobacco abuse, COPD, nonobstructive coronary disease, chronic abdominal pain, back pain, GERD, and hypertension who presents to the emergency room with complaints of headache and nausea and vomiting.  The patient reports that starting yesterday afternoon she has been having intractable nausea and vomiting for approximately every hour and a half, brown colored vomitus without any blood.  She has had her regular abdominal pain which does not seem to be any worse than it normally is and she reports not having any bowel movements for the past 3 days.  She also reports severe headache which recurred this morning and actually woke her up.  She reports it as a frontal headache, also had significant photophobia with that.  She denies any fever, neck stiffness, unilateral weakness or numbness or any changes in her vision. Due to the persistence of her headache, she reports to the emergency room with her nausea and vomiting.  A CT scan of the abdomen was done in the emergency room and she was found to have enteritis but no signs of obstruction.  She was also noted to be significantly hypoxic on room air with O2 saturations in the 80s.  Per ER physician's, the patient was also found to be wheezing.  She was given steroids as well as breathing treatments and since then her wheezing has improved.  She is maintained on 3 liters of oxygen to maintain  her sats above 90%.  Blood gas was obtained and she was found to have a pH of 7.279, pCO2 of 78 and she has been referred for admission.  PAST MEDICAL HISTORY: 1. Hypertension. 2. GERD. 3. Chronic pain. 4. Pulmonary embolism in the past, currently on Coumadin. 5. Hemorrhoids. 6. Tobacco abuse. 7. COPD. 8. Nonobstructive coronary disease with a 67% obstruction in the     circumflex on medical management.  ALLERGIES: 1. Amoxicillin. 2. Clindamycin 3. Bactrim which causes rash.  MEDICATIONS PRIOR TO ADMISSION: 1. Benicar. 2. Flexeril. 3. Nexium. 4. Opana ER. 5. Oxycodone/acetaminophen. 6. Phenergan. 7. Xanax.  This will have to be verified by the pharmacy.  FAMILY HISTORY:  Noncontributory.  SOCIAL HISTORY:  The patient smokes a pack cigarettes a day.  She denies any alcohol or any drug use.  She lives with her husband and ambulates with aid of a cane.  REVIEW OF SYSTEMS:  The patient does not have any cough or shortness of breath at this time.  She does report occasional wheezing.  She has not had any fever, chest pain, palpitations.  She does have nausea and  vomiting.  She has chronic abdominal pain and no diarrhea at this time. She does feel somewhat constipated.  She does not have any rashes.  She does not have any unilateral weakness or numbness.  She does have photophobia.  PHYSICAL EXAMINATION:  VITAL SIGNS:  Blood pressure 117/53, heart rate of 95, respiratory rate of 22, temperature 98.4, pulse ox of 100% on 3 liters. GENERAL:  The patient is uncomfortable lying in a dark room. HEENT:  Normocephalic, atraumatic.  Pupils are equal, round, reactive to light. NECK:  Supple without any rigidity. CHEST:  Clear to auscultation bilaterally.  No wheezes heard. CARDIAC:  Shows S1, S2 with a regular rate and rhythm. ABDOMEN:  Soft, mildly tender diffusely.  Bowel sounds are active. EXTREMITIES:  Show no edema. NEUROLOGICAL:  The patient has equal strength  bilaterally, 4/5 bilaterally.  Cranial nerves II-XII are grossly intact. SKIN:  Warm.  LABORATORY DATA:  WBC 10.0, hemoglobin 13.9, platelet count of 208,000. Sodium 142, potassium 3.9, chloride 98, bicarb 35, glucose 130, BUN 5, creatinine 0.75.  Liver function tests were within normal limits. Albumin of 2.8, lipase of 231.  Blood gas shows pH of 7.27, pCO2 78, pO2 of 75, oxygen saturation 92%.  Abdominal x-ray shows some nonobstructive bowel gas pattern.  No free air.  Chest x-ray shows stable mid lung atelectasis versus scarring, cardiomegaly with no definite concern for pneumonia.  CT abdomen and pelvis with contrast shows diffuse edema and free fluid within the small bowel mesentery and along the pericolic gutters.  Discrete lesion of focal information is not evident.  This is most consistent with a nonspecific enteritis.  No evidence for obstruction or perforation, probable fatty infiltration to the liver and mild gastric varices.  ASSESSMENT/PLAN: 1. Intractable nausea and vomiting.  We will keep the patient n.p.o.     right now and provide her antiemetics and supportive therapy.  Once     the patient's nausea has improved, we can try a liquid diet and     advancing her from there. 2. Acute respiratory failure with chronic obstructive pulmonary     disease.  The patient likely has a component of chronic respiratory     failure secondary to her chronic obstructive pulmonary disease as     well as her body habitus but due to her low pH as well as elevated     pCO2, we will admit her to the step-down for now and place her on     BiPAP and we have asked the Pulmonary Critical Care to see her in     consultation.  We will continue her nebulizer treatments, cortical     steroids as well as Avelox for antibiotic coverage. 3. Morbid obesity.  The patient was counseled on the importance of     weight loss. 4. Chronic pain.  We will continue her long-acting pain medication     with  morphine for breakthrough. 5. Enteritis.  The patient will be placed on antibiotics.  This will     be followed supportively. 6. Nonobstructive coronary disease.  We will cycle her cardiac     enzymes.  We will also check a D-dimer.  If this is positive, then     she will receive a CT angio of the chest to rule out any pulmonary     embolism. 7. Intractable headache.  The patient does have a history of migraines     and this is likely recurrence of that.  We will  check a CT of the     head to rule out any intracranial process and continue her on pain     medication. 8. Tobacco abuse.  We will give her nicotine patch and order tobacco     cessation consult. 9. Hypertension.  We will continue her on outpatient regimen. 10.Code status.  The patient is a full code.  Further orders will     depend on clinical course.     Kathie Dike, MD     JM/MEDQ  D:  11/16/2010  T:  11/16/2010  Job:  [id number]  cc:   Azalia Bilis, M.D.  Electronically Signed by Kathie Dike  on 11/26/2010 09:43:00 PM

## 2010-11-30 LAB — COMPREHENSIVE METABOLIC PANEL
AST: 44 U/L — ABNORMAL HIGH (ref 0–37)
Albumin: 3.2 g/dL — ABNORMAL LOW (ref 3.5–5.2)
Calcium: 8.8 mg/dL (ref 8.4–10.5)
Chloride: 101 mEq/L (ref 96–112)
Creatinine, Ser: 0.57 mg/dL (ref 0.4–1.2)
GFR calc Af Amer: >60 mL/min (ref >60)
Total Protein: 7.4 g/dL (ref 6.0–8.3)

## 2010-11-30 LAB — CBC
MCH: 31.4 pg (ref 26.0–34.0)
MCHC: 34.1 g/dL (ref 30.0–36.0)
Platelets: 173 10*3/uL (ref 150–400)
RBC: 4.75 MIL/uL (ref 3.87–5.11)
RDW: 13.2 % (ref 11.5–15.5)

## 2010-11-30 LAB — URINALYSIS, ROUTINE W REFLEX MICROSCOPIC
Ketones, ur: NEGATIVE mg/dL
Nitrite: NEGATIVE
pH: 6.5 (ref 5.0–8.0)

## 2010-11-30 LAB — POCT I-STAT, CHEM 8
BUN: <3 mg/dL — ABNORMAL LOW (ref 6–23)
Calcium, Ion: 1.08 mmol/L — ABNORMAL LOW (ref 1.12–1.32)
Chloride: 100 mEq/L (ref 96–112)
Glucose, Bld: 104 mg/dL — ABNORMAL HIGH (ref 70–99)
Potassium: 3.2 mEq/L — ABNORMAL LOW (ref 3.5–5.1)

## 2010-11-30 LAB — POCT CARDIAC MARKERS
CKMB, poc: <1 ng/mL — ABNORMAL LOW (ref 1.0–8.0)
Troponin i, poc: <0.05 ng/mL (ref 0.00–0.09)

## 2010-11-30 LAB — DIFFERENTIAL
Eosinophils Relative: 3 % (ref 0–5)
Lymphocytes Relative: 39 % (ref 12–46)
Lymphs Abs: 3.2 10*3/uL (ref 0.7–4.0)
Monocytes Absolute: 0.5 10*3/uL (ref 0.1–1.0)

## 2010-12-11 ENCOUNTER — Encounter: Payer: Self-pay | Admitting: Internal Medicine

## 2010-12-11 DIAGNOSIS — J9620 Acute and chronic respiratory failure, unspecified whether with hypoxia or hypercapnia: Secondary | ICD-10-CM

## 2010-12-11 DIAGNOSIS — I2510 Atherosclerotic heart disease of native coronary artery without angina pectoris: Secondary | ICD-10-CM | POA: Insufficient documentation

## 2010-12-11 DIAGNOSIS — I10 Essential (primary) hypertension: Secondary | ICD-10-CM

## 2010-12-11 DIAGNOSIS — G8929 Other chronic pain: Secondary | ICD-10-CM | POA: Insufficient documentation

## 2010-12-11 DIAGNOSIS — E1159 Type 2 diabetes mellitus with other circulatory complications: Secondary | ICD-10-CM | POA: Insufficient documentation

## 2010-12-11 DIAGNOSIS — Z720 Tobacco use: Secondary | ICD-10-CM | POA: Insufficient documentation

## 2010-12-11 DIAGNOSIS — J449 Chronic obstructive pulmonary disease, unspecified: Secondary | ICD-10-CM

## 2010-12-11 HISTORY — DX: Acute and chronic respiratory failure, unspecified whether with hypoxia or hypercapnia: J96.20

## 2010-12-12 ENCOUNTER — Inpatient Hospital Stay: Payer: Medicare Other | Admitting: Internal Medicine

## 2010-12-15 ENCOUNTER — Observation Stay (HOSPITAL_COMMUNITY)
Admission: EM | Admit: 2010-12-15 | Discharge: 2010-12-16 | Disposition: A | Discharge status: Home / Self Care | Payer: Medicare Other | Attending: Internal Medicine | Admitting: Internal Medicine

## 2010-12-15 ENCOUNTER — Emergency Department (HOSPITAL_COMMUNITY): Payer: Medicare Other

## 2010-12-15 DIAGNOSIS — M549 Dorsalgia, unspecified: Secondary | ICD-10-CM | POA: Insufficient documentation

## 2010-12-15 DIAGNOSIS — I10 Essential (primary) hypertension: Secondary | ICD-10-CM | POA: Insufficient documentation

## 2010-12-15 DIAGNOSIS — I2510 Atherosclerotic heart disease of native coronary artery without angina pectoris: Secondary | ICD-10-CM | POA: Insufficient documentation

## 2010-12-15 DIAGNOSIS — I5033 Acute on chronic diastolic (congestive) heart failure: Secondary | ICD-10-CM | POA: Insufficient documentation

## 2010-12-15 DIAGNOSIS — R0602 Shortness of breath: Secondary | ICD-10-CM | POA: Insufficient documentation

## 2010-12-15 DIAGNOSIS — I509 Heart failure, unspecified: Secondary | ICD-10-CM | POA: Insufficient documentation

## 2010-12-15 DIAGNOSIS — J9620 Acute and chronic respiratory failure, unspecified whether with hypoxia or hypercapnia: Secondary | ICD-10-CM | POA: Insufficient documentation

## 2010-12-15 DIAGNOSIS — F17200 Nicotine dependence, unspecified, uncomplicated: Secondary | ICD-10-CM | POA: Insufficient documentation

## 2010-12-15 DIAGNOSIS — J441 Chronic obstructive pulmonary disease with (acute) exacerbation: Principal | ICD-10-CM | POA: Insufficient documentation

## 2010-12-15 DIAGNOSIS — G4730 Sleep apnea, unspecified: Secondary | ICD-10-CM | POA: Insufficient documentation

## 2010-12-15 DIAGNOSIS — Z86711 Personal history of pulmonary embolism: Secondary | ICD-10-CM | POA: Insufficient documentation

## 2010-12-15 DIAGNOSIS — K219 Gastro-esophageal reflux disease without esophagitis: Secondary | ICD-10-CM | POA: Insufficient documentation

## 2010-12-15 DIAGNOSIS — R51 Headache: Secondary | ICD-10-CM | POA: Insufficient documentation

## 2010-12-15 DIAGNOSIS — G8929 Other chronic pain: Secondary | ICD-10-CM | POA: Insufficient documentation

## 2010-12-15 LAB — CARDIAC PANEL(CRET KIN+CKTOT+MB+TROPI)
CK, MB: 0.9 ng/mL (ref 0.3–4.0)
Troponin I: 0.01 ng/mL (ref 0.00–0.06)

## 2010-12-15 LAB — COMPREHENSIVE METABOLIC PANEL
ALT: 14 U/L (ref 0–35)
AST: 46 U/L — ABNORMAL HIGH (ref 0–37)
Alkaline Phosphatase: 79 U/L (ref 39–117)
CO2: 32 mEq/L (ref 19–32)
Chloride: 97 mEq/L (ref 96–112)
GFR calc Af Amer: >60 mL/min (ref >60)
GFR calc non Af Amer: >60 mL/min (ref >60)
Glucose, Bld: 131 mg/dL — ABNORMAL HIGH (ref 70–99)
Sodium: 138 mEq/L (ref 135–145)
Total Bilirubin: 0.7 mg/dL (ref 0.3–1.2)

## 2010-12-15 LAB — POCT I-STAT 3, ART BLOOD GAS (G3+)
Acid-Base Excess: 6 mmol/L — ABNORMAL HIGH (ref 0.0–2.0)
Bicarbonate: 35.3 mEq/L — ABNORMAL HIGH (ref 20.0–24.0)
Patient temperature: 98.6
pH, Arterial: 7.305 — ABNORMAL LOW (ref 7.350–7.400)
pO2, Arterial: 69 mmHg — ABNORMAL LOW (ref 80.0–100.0)

## 2010-12-15 LAB — POCT I-STAT, CHEM 8
Chloride: 98 mEq/L (ref 96–112)
HCT: 42 % (ref 36.0–46.0)
Hemoglobin: 14.3 g/dL (ref 12.0–15.0)
Potassium: 3.9 mEq/L (ref 3.5–5.1)
Sodium: 139 mEq/L (ref 135–145)

## 2010-12-15 LAB — POCT CARDIAC MARKERS
CKMB, poc: <1 ng/mL — ABNORMAL LOW (ref 1.0–8.0)
Myoglobin, poc: 52.6 ng/mL (ref 12–200)
Troponin i, poc: <0.05 ng/mL (ref 0.00–0.09)

## 2010-12-15 LAB — URINALYSIS, ROUTINE W REFLEX MICROSCOPIC
Bilirubin Urine: NEGATIVE
Glucose, UA: NEGATIVE mg/dL
Hgb urine dipstick: NEGATIVE
Ketones, ur: NEGATIVE mg/dL
Protein, ur: NEGATIVE mg/dL

## 2010-12-15 LAB — DIFFERENTIAL
Basophils Absolute: 0 10*3/uL (ref 0.0–0.1)
Lymphocytes Relative: 40 % (ref 12–46)
Lymphs Abs: 4.1 10*3/uL — ABNORMAL HIGH (ref 0.7–4.0)
Neutro Abs: 5.2 10*3/uL (ref 1.7–7.7)
Neutrophils Relative %: 50 % (ref 43–77)

## 2010-12-15 LAB — CBC
HCT: 41.2 % (ref 36.0–46.0)
MCV: 93.2 fL (ref 78.0–100.0)
RBC: 4.42 MIL/uL (ref 3.87–5.11)
WBC: 10.4 10*3/uL (ref 4.0–10.5)

## 2010-12-15 LAB — MAGNESIUM: Magnesium: 1.7 mg/dL (ref 1.5–2.5)

## 2010-12-16 LAB — URINALYSIS, ROUTINE W REFLEX MICROSCOPIC
Glucose, UA: NEGATIVE mg/dL
Hgb urine dipstick: NEGATIVE
Ketones, ur: NEGATIVE mg/dL
Protein, ur: NEGATIVE mg/dL

## 2010-12-16 LAB — CBC
Hemoglobin: 15.3 g/dL — ABNORMAL HIGH (ref 12.0–15.0)
RDW: 12.8 % (ref 11.5–15.5)
WBC: 7.6 10*3/uL (ref 4.0–10.5)

## 2010-12-16 LAB — DIFFERENTIAL
Basophils Relative: 0 % (ref 0–1)
Eosinophils Absolute: 0.2 10*3/uL (ref 0.0–0.7)
Monocytes Absolute: 0.5 10*3/uL (ref 0.1–1.0)
Monocytes Relative: 7 % (ref 3–12)
Neutrophils Relative %: 52 % (ref 43–77)

## 2010-12-16 LAB — COMPREHENSIVE METABOLIC PANEL
ALT: 22 U/L (ref 0–35)
Albumin: 3.6 g/dL (ref 3.5–5.2)
Alkaline Phosphatase: 70 U/L (ref 39–117)
Chloride: 99 mEq/L (ref 96–112)
Glucose, Bld: 132 mg/dL — ABNORMAL HIGH (ref 70–99)
Potassium: 3.8 mEq/L (ref 3.5–5.1)
Sodium: 138 mEq/L (ref 135–145)
Total Protein: 7.1 g/dL (ref 6.0–8.3)

## 2010-12-16 LAB — HEMOCCULT GUIAC POC 1CARD (OFFICE): Fecal Occult Bld: NEGATIVE

## 2010-12-17 LAB — URINE CULTURE: Colony Count: 5000

## 2010-12-18 LAB — URINALYSIS, ROUTINE W REFLEX MICROSCOPIC
Protein, ur: NEGATIVE mg/dL
Urobilinogen, UA: 0.2 mg/dL (ref 0.0–1.0)

## 2010-12-20 LAB — LIPASE, BLOOD: Lipase: 47 U/L (ref 11–59)

## 2010-12-20 LAB — CK TOTAL AND CKMB (NOT AT ARMC)
CK, MB: 1 ng/mL (ref 0.3–4.0)
Relative Index: INVALID (ref 0.0–2.5)
Relative Index: INVALID (ref 0.0–2.5)

## 2010-12-20 LAB — CBC
MCV: 90.3 fL (ref 78.0–100.0)
Platelets: 249 10*3/uL (ref 150–400)
WBC: 10 10*3/uL (ref 4.0–10.5)

## 2010-12-20 LAB — COMPREHENSIVE METABOLIC PANEL
AST: 63 U/L — ABNORMAL HIGH (ref 0–37)
CO2: 29 mEq/L (ref 19–32)
Calcium: 8.7 mg/dL (ref 8.4–10.5)
Creatinine, Ser: 0.81 mg/dL (ref 0.4–1.2)
GFR calc Af Amer: >60 mL/min (ref >60)
GFR calc non Af Amer: >60 mL/min (ref >60)

## 2010-12-20 LAB — TROPONIN I: Troponin I: 0.02 ng/mL (ref 0.00–0.06)

## 2010-12-20 LAB — D-DIMER, QUANTITATIVE: D-Dimer, Quant: 0.38 ug/mL-FEU (ref 0.00–0.48)

## 2010-12-20 LAB — DIFFERENTIAL
Eosinophils Relative: 2 % (ref 0–5)
Lymphocytes Relative: 44 % (ref 12–46)
Lymphs Abs: 4.4 10*3/uL — ABNORMAL HIGH (ref 0.7–4.0)
Neutro Abs: 4.6 10*3/uL (ref 1.7–7.7)
Neutrophils Relative %: 46 % (ref 43–77)

## 2010-12-20 LAB — URINALYSIS, ROUTINE W REFLEX MICROSCOPIC
Nitrite: NEGATIVE
Specific Gravity, Urine: 1.009 (ref 1.005–1.030)
Urobilinogen, UA: 1 mg/dL (ref 0.0–1.0)

## 2010-12-22 ENCOUNTER — Emergency Department (HOSPITAL_COMMUNITY)
Admission: EM | Admit: 2010-12-22 | Discharge: 2010-12-22 | Disposition: A | Discharge status: Home / Self Care | Payer: Medicare Other | Attending: Emergency Medicine | Admitting: Emergency Medicine

## 2010-12-22 ENCOUNTER — Emergency Department (HOSPITAL_COMMUNITY): Payer: Medicare Other

## 2010-12-22 DIAGNOSIS — R609 Edema, unspecified: Secondary | ICD-10-CM | POA: Insufficient documentation

## 2010-12-22 DIAGNOSIS — M79609 Pain in unspecified limb: Secondary | ICD-10-CM | POA: Insufficient documentation

## 2010-12-22 DIAGNOSIS — X500XXA Overexertion from strenuous movement or load, initial encounter: Secondary | ICD-10-CM | POA: Insufficient documentation

## 2010-12-22 DIAGNOSIS — M25579 Pain in unspecified ankle and joints of unspecified foot: Secondary | ICD-10-CM | POA: Insufficient documentation

## 2010-12-22 DIAGNOSIS — I10 Essential (primary) hypertension: Secondary | ICD-10-CM | POA: Insufficient documentation

## 2010-12-22 DIAGNOSIS — S93409A Sprain of unspecified ligament of unspecified ankle, initial encounter: Secondary | ICD-10-CM | POA: Insufficient documentation

## 2010-12-22 DIAGNOSIS — R109 Unspecified abdominal pain: Secondary | ICD-10-CM | POA: Insufficient documentation

## 2010-12-22 DIAGNOSIS — R296 Repeated falls: Secondary | ICD-10-CM | POA: Insufficient documentation

## 2010-12-22 DIAGNOSIS — K219 Gastro-esophageal reflux disease without esophagitis: Secondary | ICD-10-CM | POA: Insufficient documentation

## 2010-12-22 DIAGNOSIS — G8929 Other chronic pain: Secondary | ICD-10-CM | POA: Insufficient documentation

## 2010-12-22 LAB — POCT I-STAT, CHEM 8
BUN: 3 mg/dL — ABNORMAL LOW (ref 6–23)
Calcium, Ion: 1.02 mmol/L — ABNORMAL LOW (ref 1.12–1.32)
HCT: 45 % (ref 36.0–46.0)
Hemoglobin: 15.3 g/dL — ABNORMAL HIGH (ref 12.0–15.0)
Sodium: 137 mEq/L (ref 135–145)
TCO2: 40 mmol/L (ref 0–100)

## 2010-12-22 MED ORDER — IOHEXOL 300 MG/ML  SOLN
120.0000 mL | Freq: Once | INTRAMUSCULAR | Status: AC | PRN
  Administered 2010-12-22: 120 mL via INTRAVENOUS

## 2010-12-23 LAB — POCT I-STAT, CHEM 8
BUN: 7 mg/dL (ref 6–23)
Creatinine, Ser: 0.7 mg/dL (ref 0.4–1.2)
Potassium: 3.4 mEq/L — ABNORMAL LOW (ref 3.5–5.1)
Sodium: 144 mEq/L (ref 135–145)
TCO2: 31 mmol/L (ref 0–100)

## 2010-12-23 LAB — ETHANOL: Alcohol, Ethyl (B): <5 mg/dL (ref 0–10)

## 2010-12-23 LAB — DIFFERENTIAL
Basophils Absolute: 0 10*3/uL (ref 0.0–0.1)
Lymphocytes Relative: 44 % (ref 12–46)
Lymphs Abs: 3.5 10*3/uL (ref 0.7–4.0)
Neutro Abs: 3.6 10*3/uL (ref 1.7–7.7)
Neutrophils Relative %: 45 % (ref 43–77)

## 2010-12-23 LAB — RAPID URINE DRUG SCREEN, HOSP PERFORMED
Amphetamines: NOT DETECTED
Barbiturates: NOT DETECTED
Cocaine: NOT DETECTED
Opiates: POSITIVE — AB
Tetrahydrocannabinol: NOT DETECTED

## 2010-12-23 LAB — CBC
HCT: 40.7 % (ref 36.0–46.0)
Platelets: 177 10*3/uL (ref 150–400)
RDW: 13.2 % (ref 11.5–15.5)
WBC: 8 10*3/uL (ref 4.0–10.5)

## 2010-12-24 LAB — DIFFERENTIAL
Eosinophils Relative: 2 % (ref 0–5)
Lymphocytes Relative: 40 % (ref 12–46)
Monocytes Absolute: 0.4 10*3/uL (ref 0.1–1.0)
Monocytes Relative: 4 % (ref 3–12)
Neutro Abs: 5 10*3/uL (ref 1.7–7.7)

## 2010-12-24 LAB — COMPREHENSIVE METABOLIC PANEL
Alkaline Phosphatase: 70 U/L (ref 39–117)
BUN: 9 mg/dL (ref 6–23)
Chloride: 103 mEq/L (ref 96–112)
Creatinine, Ser: 0.75 mg/dL (ref 0.4–1.2)
GFR calc non Af Amer: >60 mL/min (ref >60)
Glucose, Bld: 119 mg/dL — ABNORMAL HIGH (ref 70–99)
Potassium: 3.6 mEq/L (ref 3.5–5.1)
Total Bilirubin: 0.4 mg/dL (ref 0.3–1.2)

## 2010-12-24 LAB — PROTIME-INR
INR: 1.1 (ref 0.00–1.49)
Prothrombin Time: 14 seconds (ref 11.6–15.2)

## 2010-12-24 LAB — LIPID PANEL
Cholesterol: 176 mg/dL (ref 0–200)
HDL: 23 mg/dL — ABNORMAL LOW (ref >39)
LDL Cholesterol: 95 mg/dL (ref 0–99)
Total CHOL/HDL Ratio: 7.7 RATIO
Triglycerides: 292 mg/dL — ABNORMAL HIGH (ref <150)

## 2010-12-24 LAB — CARDIAC PANEL(CRET KIN+CKTOT+MB+TROPI)
CK, MB: 1.1 ng/mL (ref 0.3–4.0)
Total CK: 82 U/L (ref 7–177)

## 2010-12-24 LAB — CBC
HCT: 41 % (ref 36.0–46.0)
Hemoglobin: 14.3 g/dL (ref 12.0–15.0)
MCHC: 34.8 g/dL (ref 30.0–36.0)
RBC: 4.6 MIL/uL (ref 3.87–5.11)
RDW: 12.5 % (ref 11.5–15.5)

## 2010-12-24 LAB — APTT: aPTT: 30 seconds (ref 24–37)

## 2010-12-25 LAB — PROTIME-INR
INR: 1 (ref 0.00–1.49)
Prothrombin Time: 13.4 seconds (ref 11.6–15.2)

## 2010-12-25 LAB — BRAIN NATRIURETIC PEPTIDE: Pro B Natriuretic peptide (BNP): 44 pg/mL (ref 0.0–100.0)

## 2010-12-25 LAB — BASIC METABOLIC PANEL
CO2: 29 mEq/L (ref 19–32)
Calcium: 8.4 mg/dL (ref 8.4–10.5)
Glucose, Bld: 95 mg/dL (ref 70–99)
Potassium: 3.6 mEq/L (ref 3.5–5.1)
Sodium: 136 mEq/L (ref 135–145)

## 2010-12-25 LAB — CBC
Hemoglobin: 15.6 g/dL — ABNORMAL HIGH (ref 12.0–15.0)
MCHC: 35 g/dL (ref 30.0–36.0)
Platelets: 180 10*3/uL (ref 150–400)
RDW: 12.4 % (ref 11.5–15.5)

## 2010-12-25 LAB — COMPREHENSIVE METABOLIC PANEL
ALT: 35 U/L (ref 0–35)
Albumin: 3.5 g/dL (ref 3.5–5.2)
Calcium: 9 mg/dL (ref 8.4–10.5)
GFR calc Af Amer: >60 mL/min (ref >60)
Glucose, Bld: 129 mg/dL — ABNORMAL HIGH (ref 70–99)
Sodium: 138 mEq/L (ref 135–145)
Total Protein: 7 g/dL (ref 6.0–8.3)

## 2010-12-25 LAB — DIFFERENTIAL
Basophils Relative: 2 % — ABNORMAL HIGH (ref 0–1)
Eosinophils Relative: 3 % (ref 0–5)
Lymphocytes Relative: 39 % (ref 12–46)
Monocytes Absolute: 0.6 10*3/uL (ref 0.1–1.0)
Neutrophils Relative %: 50 % (ref 43–77)

## 2010-12-25 LAB — POCT CARDIAC MARKERS
CKMB, poc: <1 ng/mL — ABNORMAL LOW (ref 1.0–8.0)
CKMB, poc: <1 ng/mL — ABNORMAL LOW (ref 1.0–8.0)
CKMB, poc: <1 ng/mL — ABNORMAL LOW (ref 1.0–8.0)
Troponin i, poc: <0.05 ng/mL (ref 0.00–0.09)

## 2010-12-25 LAB — CK TOTAL AND CKMB (NOT AT ARMC)
CK, MB: 1.3 ng/mL (ref 0.3–4.0)
Total CK: 93 U/L (ref 7–177)

## 2010-12-25 LAB — CARDIAC PANEL(CRET KIN+CKTOT+MB+TROPI)
CK, MB: 1.6 ng/mL (ref 0.3–4.0)
Relative Index: INVALID (ref 0.0–2.5)
Total CK: 98 U/L (ref 7–177)

## 2010-12-31 LAB — POCT I-STAT, CHEM 8
BUN: 10 mg/dL (ref 6–23)
Creatinine, Ser: 0.7 mg/dL (ref 0.4–1.2)
Hemoglobin: 15.3 g/dL — ABNORMAL HIGH (ref 12.0–15.0)
Potassium: 3.1 mEq/L — ABNORMAL LOW (ref 3.5–5.1)
Sodium: 140 mEq/L (ref 135–145)

## 2010-12-31 LAB — DIFFERENTIAL
Eosinophils Absolute: 0.1 10*3/uL (ref 0.0–0.7)
Lymphocytes Relative: 42 % (ref 12–46)
Lymphs Abs: 4.2 10*3/uL — ABNORMAL HIGH (ref 0.7–4.0)
Neutrophils Relative %: 51 % (ref 43–77)

## 2010-12-31 LAB — URINALYSIS, ROUTINE W REFLEX MICROSCOPIC
Nitrite: NEGATIVE
Specific Gravity, Urine: 1.014 (ref 1.005–1.030)
Urobilinogen, UA: 0.2 mg/dL (ref 0.0–1.0)

## 2010-12-31 LAB — CBC
Platelets: 223 10*3/uL (ref 150–400)
WBC: 10 10*3/uL (ref 4.0–10.5)

## 2010-12-31 LAB — ACETAMINOPHEN LEVEL: Acetaminophen (Tylenol), Serum: <10 ug/mL — ABNORMAL LOW (ref 10–30)

## 2011-01-02 ENCOUNTER — Emergency Department (HOSPITAL_COMMUNITY)
Admission: EM | Admit: 2011-01-02 | Discharge: 2011-01-02 | Disposition: A | Discharge status: Home / Self Care | Payer: Medicare Other | Attending: Emergency Medicine | Admitting: Emergency Medicine

## 2011-01-02 ENCOUNTER — Emergency Department (HOSPITAL_COMMUNITY): Payer: Medicare Other

## 2011-01-02 DIAGNOSIS — R059 Cough, unspecified: Secondary | ICD-10-CM | POA: Insufficient documentation

## 2011-01-02 DIAGNOSIS — R112 Nausea with vomiting, unspecified: Secondary | ICD-10-CM | POA: Insufficient documentation

## 2011-01-02 DIAGNOSIS — J449 Chronic obstructive pulmonary disease, unspecified: Secondary | ICD-10-CM | POA: Insufficient documentation

## 2011-01-02 DIAGNOSIS — K219 Gastro-esophageal reflux disease without esophagitis: Secondary | ICD-10-CM | POA: Insufficient documentation

## 2011-01-02 DIAGNOSIS — J4489 Other specified chronic obstructive pulmonary disease: Secondary | ICD-10-CM | POA: Insufficient documentation

## 2011-01-02 DIAGNOSIS — I10 Essential (primary) hypertension: Secondary | ICD-10-CM | POA: Insufficient documentation

## 2011-01-02 DIAGNOSIS — I2510 Atherosclerotic heart disease of native coronary artery without angina pectoris: Secondary | ICD-10-CM | POA: Insufficient documentation

## 2011-01-02 DIAGNOSIS — I509 Heart failure, unspecified: Secondary | ICD-10-CM | POA: Insufficient documentation

## 2011-01-02 DIAGNOSIS — R05 Cough: Secondary | ICD-10-CM | POA: Insufficient documentation

## 2011-01-02 DIAGNOSIS — R509 Fever, unspecified: Secondary | ICD-10-CM | POA: Insufficient documentation

## 2011-01-02 DIAGNOSIS — R197 Diarrhea, unspecified: Secondary | ICD-10-CM | POA: Insufficient documentation

## 2011-01-02 DIAGNOSIS — M7989 Other specified soft tissue disorders: Secondary | ICD-10-CM | POA: Insufficient documentation

## 2011-01-02 DIAGNOSIS — R0602 Shortness of breath: Secondary | ICD-10-CM | POA: Insufficient documentation

## 2011-01-02 LAB — CBC
HCT: 44.2 % (ref 36.0–46.0)
Hemoglobin: 13.6 g/dL (ref 12.0–15.0)
MCHC: 30.8 g/dL (ref 30.0–36.0)
RBC: 4.65 MIL/uL (ref 3.87–5.11)

## 2011-01-02 LAB — BASIC METABOLIC PANEL
CO2: 36 mEq/L — ABNORMAL HIGH (ref 19–32)
Calcium: 8.3 mg/dL — ABNORMAL LOW (ref 8.4–10.5)
Chloride: 98 mEq/L (ref 96–112)
Glucose, Bld: 153 mg/dL — ABNORMAL HIGH (ref 70–99)
Potassium: 3.4 mEq/L — ABNORMAL LOW (ref 3.5–5.1)
Sodium: 139 mEq/L (ref 135–145)

## 2011-01-02 LAB — DIFFERENTIAL
Basophils Absolute: 0 10*3/uL (ref 0.0–0.1)
Lymphocytes Relative: 34 % (ref 12–46)
Monocytes Absolute: 0.7 10*3/uL (ref 0.1–1.0)
Neutro Abs: 5.1 10*3/uL (ref 1.7–7.7)
Neutrophils Relative %: 55 % (ref 43–77)

## 2011-01-03 NOTE — Discharge Summary (Signed)
[name], [name]                  ACCOUNT NO.:  [id number]  MEDICAL RECORD NO.:  [id number]           PATIENT TYPE:  I  LOCATION:  4743                         FACILITY:  [facility]  PHYSICIAN:  Shantoria Ellwood I Darnel Mchan, MD      DATE OF BIRTH:  06/05/1961  DATE OF ADMISSION:  12/15/2010 DATE OF DISCHARGE:  12/16/2010                              DISCHARGE SUMMARY   PRIMARY CARE PHYSICIAN:  Holley Bouche, MD  DISCHARGE DIAGNOSES: 1. Acute chronic obstructive pulmonary disease with exacerbation. 2. Acute-on-chronic hypoxic and hypercapnic respiratory failure, now     on home oxygen nasal cannula. 3. History of diastolic congestive heart failure, now stable. 4. Morbid obesity. 5. Hypertension. 6. Gastroesophageal reflux disease. 7. Chronic pain. 8. History of pulmonary embolism, but currently not on Coumadin. 9. History of hemorrhoids. 10.History of tobacco abuse, quit one month ago. 11.Nonobstructive coronary artery disease 67% obstruction to     circumflex, on medical management.  DISCHARGE MEDICATIONS:  The patient will continue her home medication, no new change.  The patient will be discharged with 2 L of nasal cannula.  PROCEDURES:  Chest x-ray cardiomegaly without CHF or pneumonia, stable basilar or lingular atelectasis and scarring.  HISTORY OF PRESENT ILLNESS:  This is a 50 year old obese female with multiple medical problem including COPD, quit smoking one month ago, chronic respiratory failure recently discharge from the hospital with diagnosis of acute-on-chronic COPD and acute-on-chronic diastolic CHF. The patient on chronic pain medications secondary to chronic back and abdominal pain.  The patient presented with shortness of breath and she noticed oxygen desaturation to low 80s.  On examination in the emergency room, the patient's vital signs, temperature 98.3, blood pressure 119/69, pulse rate 101, respiratory rate 20, and saturating 88%, which has come up to 95% on room  air on 2 L.  Her lung examination, mild wheezing bilaterally and her blood workup was normal.  EKG, there is no significant change from before.  The patient admitted with a diagnosis of acute-on-chronic respiratory failure hypoxic and hypercapnic, did not require any form of BiPAP.  Started on nebs treatment.  No steroid was added and I do not feel that the patient required any form of oxygen.  The patient has a recent CT angiogram of the chest done on November 16, 2010, which was negative, did show scattered air with subsegmental atelectasis throughout both lungs, mildly enlarged azygoesophageal recess lymph node which is nonspecific.  Chest x-ray was no evidence of pneumonia and no evidence of CHF.  The patient was admitted for 24 hour.  Cardiac enzymes were cycled which were negative. The patient checked for evaluation for home oxygen and the patient required 2 L of nasal cannula.  Currently, the patient has no wheezing or walking without shortness of breath. The patient will be discharged home.  DISCHARGE MEDICATIONS: 1. Alprazolam half to one tablet q.8 h. p.r.n. 2. Benicar 40 mg p.o. daily. 3. Benazepril 10 mg every 12 hour. 4. Estradiol 2 mg daily. 5. Flonase 2 sprays daily. 6. Furosemide 80 mg daily. 7. Gas-X. 8. Lidoderm patch. 9. Meloxicam 15 mg p.o. daily  as needed. 10.MiraLax 17 g up to 3 times daily. 11.Nexium. 12.Zofran 8 mg twice daily as needed. 13.Oxycodone/acetaminophen. 14.OxyContin 20 mg p.o. b.i.d. 15.Promethazine 25 mg every 4 hours. 16.Betadine one drop daily as needed and the patient will continue her     nebs treatment, which include Xopenex inhaler and Pulmicort.  Currently, we felt the patient is stable for discharge.     Nitzia Perren Bosie Helper, MD     HIE/MEDQ  D:  12/16/2010  T:  12/17/2010  Job:  [id number]  cc:   Holley Bouche, M.D.  Electronically Signed by Ebony Cargo MD on 01/03/2011 05:36:47 AM

## 2011-01-03 NOTE — H&P (Signed)
NAME:  [name], [name]                  ACCOUNT NO.:  [id number]  MEDICAL RECORD NO.:  [id number]           PATIENT TYPE:  E  LOCATION:  [facility]                         FACILITY:  [facility]  PHYSICIAN:  Arjuna Doeden I Zerenity Bowron, MD      DATE OF BIRTH:  Nov 15, 1960  DATE OF ADMISSION:  12/15/2010 DATE OF DISCHARGE:                             HISTORY & PHYSICAL   CHIEF COMPLAINT:  Shortness of breath and hypoxia on room air.  HISTORY OF PRESENT ILLNESS:  This is a 50 year old obese lady with multiple medical problems including COPD, chronic respiratory failure, and chronic smoker, stopped smoking 1 month ago.  The patient was recently discharged from the hospital with diagnoses of acute on chronic COPD exacerbation and acute on chronic diastolic congestive heart failure.  The patient on chronic pain medication secondary to chronic back pain.  On her discharge paper on November 20, 2010, the patient discharged with home health with a plan to provide the patient with possible home oxygen and sleep study, which apparently was cancelled multiple times by the patient's story.  The patient is here today because of shortness of breath.  The patient admitted on Friday she started having vomiting about 5 times.  There was no hematemesis or hemoptysis.  She had recurrent bowel movements.  This vomiting stopped the same day.  The patient has chronic abdominal pain, which did not seem different from before.  Today, the patient also has some shortness of breath on minimal exertion.  Yesterday, she noticed she was hypoxic. She had the oximetry meter at home.  She checked her oxygen saturation 173 to 83.  She called [facility] and advised to come to the hospital for further assessment if patient require any oxygen.  The patient complained of minimal shortness of breath and wheezing.  Denies any cough.  Denies any fever.  Denies any chest pain.  Denies any lower extremity swelling.  Her main concern was her pain  medication to be placed all right.  The patient admitted her nausea and vomiting completely resolved.  She had regular bowel movement yesterday after she used the MiraLax.  PAST MEDICAL HISTORY: 1. Morbid obesity. 2. Hypertension. 3. Gastroesophageal reflux disease. 4. Chronic pain. 5. Pulmonary embolism, on Coumadin. 6. History of hemorrhoid. 7. History of tobacco abuse. 8. COPD. 9. Nonobstructive coronary artery disease with 67% obstruction to     circumflex, on medical management.  ALLERGIES:  AMOXICILLIN, CLINDAMYCIN, AND BACTRIM.  FAMILY HISTORY:  Noncontributory.  SOCIAL HISTORY:  The patient admitted she quit smoking almost 20 days ago.  She denies any alcohol or drug abuse.  She lives with her husband and ambulates with aid of a cane.  REVIEW OF SYSTEMS:  The patient as mentioned denies any cough, some mild shortness of breath.  She reports occasional wheezing.  Does not have fever.  No chest pain.  No palpitation.  No nausea.  No vomiting.  Has chronic abdominal pain and back pain.  No abnormal rashes.  Denies any unilateral weakness or numbness.  PHYSICAL EXAMINATION:  VITAL SIGNS:  Temperature 98.3, blood pressure 119/69, pulse  rate 101, respiratory rate 20, saturating 88% on presentation and right now she is 95% on 2 liters. GENERAL:  During examination, the patient is lying comfortably on bed, not in respiratory distress or shortness of breath. HEENT:  Normocephalic, atraumatic.  Pupils equal, reactive to light, and accommodation. NECK:  Supple.  No lymphadenopathy.  No masses. LUNG:  There is mild wheezing bilaterally. HEART:  S1 and S2 with no added sounds. ABDOMEN:  Soft and nontender.  Bowel sounds present.  No rebound.  No guarding. EXTREMITIES:  Without edema. CNS:  The patient is awake, alert, oriented x3 with no focal neurological findings.  LABORATORY DATA:  On blood workup, the patient's BNP is 69.  Cardiac markers x1 negative.  CBC; white blood  cell 10.4, hemoglobin 13.2, hematocrit 41.2, platelet 177.  Chemistry; sodium 139, potassium 3.9, chloride 98, glucose 152, BUN 4, and creatinine 0.7.  Blood gases show pH of 7.305, CO2 of 70.9, oxygen 69, bicarb 35.3.  Chest x-ray, cardiomegaly without CHF or pneumonia, stable bibasilar lingular atelectasis and scarring.  The patient had CT angiogram done on November 16, 2010, which was negative for any evidence of DVT.  ASSESSMENT AND PLAN: 1. Acute respiratory failure. 2. Acute on chronic hypoxic and hypercapnic respiratory failure     secondary to chronic obstructive pulmonary disease as well as sleep     apnea.  Her ABG in March was pH of 7.27, CO2 of 78.2, oxygen 75,     bicarb 36.3.  Compared with today, little more hypoxic but the     patient seemed to have chronic hypoxic and hypercapnic respiratory     failure.  The patient will be admitted observation.  We will start     the patient on nebulizer.  We will hold on any steroid treatment     and no need for antibiotic.  The patient has no fever and no     significant cough.  We will admit to tele and cycle the patient's     cardiac enzyme, low suspicious for any pulmonary embolism.  We will     ask evaluation for home oxygen, most likely the patient will need     home oxygen, nasal cannula.  Also, the patient need to schedule her     sleep studies.  Regarding to other medical issue, the patient will     continue her home medication.  The patient on chronic narcotic and     would like to continue them.  We will ask Case Management to see     the patient for home health need.     Rowene Suto Bosie Helper, MD     HIE/MEDQ  D:  12/15/2010  T:  12/15/2010  Job:  [id number]  cc:   Holley Bouche, M.D.  Electronically Signed by Ebony Cargo MD on 01/03/2011 05:36:54 AM
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
## 2011-01-28 NOTE — Op Note (Signed)
NAME:  [name], [name]                  ACCOUNT NO.:  [id number]   MEDICAL RECORD NO.:  [id number]          PATIENT TYPE:  AMB   LOCATION:  ENDO                         FACILITY:  [facility]   PHYSICIAN:  James L. Rolla Flatten., M.D.DATE OF BIRTH:  23-Dec-1960   DATE OF PROCEDURE:  06/15/2008  DATE OF DISCHARGE:                               OPERATIVE REPORT   PROCEDURE:  Colonoscopy with polypectomy.   MEDICATIONS:  Monitored anesthesia using propofol.   DESCRIPTION OF PROCEDURE:  __________ the patient __________ colon  cancer __________ abdominal pain, rectal bleeding, is due for a  colonoscopy.  At her last colonoscopy, we were unable to adequate sedate  her despite large doses of medication.  She has also been having chronic  abdominal pain.   DESCRIPTION OF PROCEDURE:  The patient was placed under monitored  anesthesia care with propofol sedation.  After adequate sedation had  been obtained, the Pentax pediatric scope was inserted and advanced.  The prep was excellent.  We were able to easily reach the cecum using a  slight bit of the abdominal pressure.  The ileocecal valve and  appendiceal orifice were seen.  Scope was withdrawn from the cecum.  Ascending and transverse colon were seen well and were normal.  In the  proximal descending colon __________ 70 cm from anal verge, a 0.5-cm  sessile polyp was countered.  It was removed with the snare and sucked  through the scope.  No other polyps were seen.  The sigmoid was seen  well and was free of diverticula.  The scope was withdrawn to the  rectum.  The rectum was normal, and the patient was seen to have large  internal hemorrhoids on the retroflexed view.  The scope was withdrawn.  The patient tolerated the procedure well, and there were no immediate  complications.   ASSESSMENT:  1. Descending colon polyp removed.  2. Rectal bleeding probably due to internal hemorrhoids.  3. Strong family history of colon cancer and polyps.   PLAN:  Routine post polypectomy instructions.  Will hold nonsteroidals  for 5 days.  Give hemorrhoid instruction sheet and see back in the  office for approximately 1 month.  Will place her on our list to repeat  her colonoscopy in 5 years.           ______________________________  Joyice Faster Rolla Flatten., M.D.     [name]  D:  06/15/2008  T:  06/15/2008  Job:  [id number]   cc:   Azalia Bilis, M.D.  Fax: [place]. Rolla Flatten., M.D.  Fax: [phone]
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
## 2011-01-28 NOTE — Group Therapy Note (Signed)
[name] is a 50 year old married female who presents to our clinic with  headache and back pain.  She is kindly referred by Dr. Shelbie Hutching.  Ms.  Neuenfeldt states she has a long history of low back pain dating back many  years.  She did undergo a surgery by Dr. Tonita Cong in 2000 or 2001, she does  not remember exactly.  Up to that point, she had had some epidural  __________  injections.  She states that she had a bad experience with  an epidural steroid injection, experiencing some incontinence after the  injection which resolved fairly quickly.   She states that she is __________ .  She is interested in pursuing  disability.   Pain is typically localized to mainly the left low back region through  the left buttock and down the left lateral hip.  She describes her pain  as fluctuating between an 8 and a 10 on a scale of 10.  Describes the  pain as pinching, stabbing, aching, and tingling.  She gets some  tingling into the left thigh.  She has a sensation of needles in the  left buttock region.   She reports general activity and relations with others and enjoyment of  life interfered with quite a bit from her pain.   Pain is typically worse in the morning and in the afternoon.   She reports pain is typically worse with walking, bending, sitting, and  activities.  She is able to stand and walk not much more than 10  minutes, but it depends on the day, some days are better than others.   She gets fair relief with the current medications as she takes these.  Currently been using hydrocodone 7.5 two tablets several times a day  which she has been receiving through Dr. Burnell Blanks clinic.   She is able to climb stairs and she does drive.   Her function is a fairly high level most of the time.  She occasionally  will need assistance with lower extremity dressing and occasionally  wiping herself after a bowel movement.   She is able to prepare meals.  She takes care of 2 children in her home,  an  24-monthold and a 52year-old.  She does cook their meals, typically  uses a microwave.  She is able to do some laundry, and she does go  shopping with her husband.  She mentions she does go to [employer]   She admits to some bladder problems which are worsened with coughing and  sneezing.  Also some tingling, spasms, anxiety.  She denies depression,  denies suicidal ideation.   Admits to occasional nausea which she states is worse when she is in  pain.  She does have a history of coughing which she attributes to acid  reflux.   Physicians involved in her care currently are Dr. SMoreen Fowler Dr. MPrudence Davidsonhad  been her doctor who left the clinic, his clinic, just a couple of weeks  ago.  She has typically been taking Vicodin 7.5 two tablets 3 times a  day.   PAST MEDICAL HISTORY:  1. Acid reflux.  2. Anxiety.  3. Headaches.  4. Migraine.   PAST SURGICAL HISTORY:  1. Exploratory laparotomy.  2. Hysterectomy.  3. She had a laminectomy by JErasmo Scorein 2000 or 2001, at L3-L4 on      the left.   She does not bring in any imaging studies; however, some were pulled up  and complete lumbar spine  was pulled up through our computer system,  which showed bilateral pars defect, grade 1 spondylolisthesis, L5-S1,  degenerative disk disease, and spondylosis.   Also noted, she had multiple emergency room visits over the last year.   MEDICATIONS:  1. Promethazine.  2. Nexium.  3. Alprazolam 0.5 three times a day.  4. Hydrocodone 7.5 two tablets three times a day.  5. __________ .  6. Estradiol.  7. Flonase.  8. Hydrocodone combination homatropine.  This is a p.r.n. medication.   SOCIAL HISTORY:  The patient is married, lives with her husband.  Denies  illegal substance use currently.  Occasionally drinks alcohol, smokes a  half a pack of cigarettes.   FAMILY HISTORY:  Positive for lung disease with father, diabetes with  mother, high blood pressure father and sister.  Psychiatric problems,   suicidal and depression in father, sister, and daughter.  Alcohol abuse  father, sister, mom.   PHYSICAL EXAMINATION:  Blood pressure is 198/150 with Dinamap, sitting,  in right arm, and repeat left arm 150/138 in the left arm manually.  Pulse is 88, respirations 20, 96% saturated on room air.  She is a well-developed female with truncal obesity who does not appear  in any distress.  She is oriented x3.  Her speech is clear, her affect  is alert.  She is cooperative and pleasant.  She follows commands  without any problems.  Transitioning from sitting to standing is done with ease.  Gait in the  room is normal.  Tandem gait, Romberg's test, performed adequately.  Limitations in lumbar motion and pain is reported, especially with  forward flexion and lateral flexion to the right.  The pain is localized  to the left low back region with these activities.  Reflexes in the lower extremities are 2+ at the patellar and Achilles  tendon and intact.  No abnormal tone is noted.  Motor strength is 5/5 at  hip flexors, knee extensors, dorsiflexors, plantar flexors and EHL.  Sensory exam with pinprick to light touch and proprioception are all  within normal limits.  Multiple areas of tenderness are noted over the left lumbar paraspinal  musculature, and especially over the left trochanter is exquisitely  tender, as well as down the left iliotibial band.  Internal/external rotation of the hips does not increase her pain.   IMPRESSION:  1. Headache.  2. Elevated blood pressure.  3. Lumbago.  4. Grade 1 spondylolisthesis at L5-S1.  5. Degenerative disk disease.  6. Lumbar spondylosis.  7. Left trochanteric bursitis and iliotibial band syndrome.   PLAN:  1. Call was made over to Dr. Laqueta Linden clinic regarding her blood      pressure and headache.  They are aware, and they will attempt to      get her into clinic today.  The patient states she will follow up      in clinic regarding this issue as  well.  2. With respect to her low back, multiple options were discussed with      Ms. Tetterton, including medications, physical therapy, modalities, and      injections.   She states at this time she has limited funding for any kind of  therapeutic option which may be costly.  She is not interested in  further diagnostic testing at this point due to that reason as well.   She is explicitly asking for hydrocodone, but seems to be willing to  trial some other options including Ultram and Neurontin.  Over the next month, we will discuss further uses of the non-steroidal  anti-inflammatory medications as well as Tylenol and optimizing her pain  management, attempting to spare her narcotic use.  Will obtain urine  drug screen today and will give her some samples of Lidoderm as well,  and a prescription for Neurontin was written 300 mg 1 p.o. nightly for 1  week, increasing dosage frequency over the next couple of weeks to 300  mg t.i.d.  She will let us know if she has any problems with this  medication, and may consider Ultram with her at the next visit, but may  also plan on increasing her dosage frequency of the Neurontin if she is  tolerating that as well.  Again, I have strongly encouraged her to  consider physical therapy.  Will see her back in a month.  Again, a call  was made to Dr. Laqueta Linden office to get her in for evaluation today for  the headache and high blood pressure.           ______________________________  Franchot Gallo, M.D.     DMK/MedQ  D:  05/12/2007 10:17:47  T:  05/12/2007 17:22:40  Job #:  [id number]

## 2011-01-28 NOTE — Cardiovascular Report (Signed)
[name], [name]                  ACCOUNT NO.:  [id number]   MEDICAL RECORD NO.:  [id number]          PATIENT TYPE:  OIB   LOCATION:  1963                         FACILITY:  [facility]   PHYSICIAN:  [name], M.D.     DATE OF BIRTH:  24-Jul-1961   DATE OF PROCEDURE:  DATE OF DISCHARGE:  01/15/2009                            CARDIAC CATHETERIZATION   REFERRING PHYSICIAN:  Blythe Stanford, MD   PROCEDURE:  Left heart catheterization, coronary angiography, left  ventriculography.   OPERATOR:  [name] Him, MD   INDICATIONS:  Chest pain.   COMPLICATIONS:  None.   IV ACCESS:  Via right femoral artery, 4-French sheath.   This is a 50 year old female who presents for cardiac catheterization  after presenting with chest pain and having anterior T-wave inversions  on her EKG.  She was brought to the cardiac catheterization laboratory  in the fasting nonsedated state.  Informed consent was obtained.  The  patient was sedated with Versed 1 mg and fentanyl 25 mcg IV.  She was  connected to continuous heart rate and pulse oximetry monitoring and  intermittent blood pressure monitoring.  Her left groin was prepped and  draped in sterile fashion.  Xylocaine 1% was used for local anesthesia.  Using the modified Seldinger technique, a 4-French sheath was placed in  the right femoral artery.  Under fluoroscopic guidance, a 4-French JL-4  catheter was placed in the left coronary artery.  Multiple cine films  were taken at 30-degree RAO, 40-degree LAO views.  This catheter was  then exchanged out over a guidewire for a 4-French 3-D RCA catheter  which could not successfully engage the coronary ostium.  It was  exchanged out over a guidewire for a 4-French JR-4 catheter which  successfully engaged in the right coronary ostium.  Multiple cine films  were taken at 30-degree RAO, 40-degree LAO views.  This catheter was  then exchanged out over a guidewire for 4-French angled pigtail catheter  which  was placed in fluoroscopic guidance in the left ventricular  cavity.  The left ventriculography was performed in the 30-degree RAO  view using total of 30 mL of contrast at 15 mL per second.  The catheter  was then pulled back across the aortic valve with no significant  gradient noted.  At the end of the procedure, all catheters and sheaths  were removed.  Manual compression was performed until adequate  hemostasis was obtained.  The patient was transferred back to room in  stable condition.   RESULTS:  1. The left main coronary artery is widely patent and bifurcates into      a left anterior descending artery and left circumflex artery.  2. The left anterior descending artery is widely patent throughout its      course.  The apex gives rise to a first high diagonal which is      small and bifurcates into 2 daughter branches which are widely      patent.  It gives rise into a second diagonal which is widely      patent.  The LAD traverses the AV groove and is widely patent.  3. The left circumflex has an ostial 60-70% narrowing.  It traverses      the AV groove and gives rise to a large obtuse marginal 1 branch      which is widely patent.  It bifurcates into 2 daughter branches,      both of which are widely patent.  The distal circumflex is widely      patent.  4. The right coronary artery is extremely large and gives rise to an      acute RV marginal branch which is widely patent.  The RCA is widely      patent throughout its course and distally bifurcates into posterior      descending artery and posterolateral artery, both of which are      widely patent.  5. Left ventriculography shows normal LV function, EF 60%.  LV      pressure 108/30 mmHg, aortic pressure 103/62 mmHg.   ASSESSMENT:  1. One-vessel borderline obstructive coronary disease of the left      circumflex.  2. Normal left ventricular function.  3. Chest pain.   PLAN:  Discharge to home.  I will review the films  with Dr. Irish Wiggins and  consider IVUS to left circumflex.  We will start aspirin 325 mg a day.  She will follow up in 2 weeks for a groin check.      [name], M.D.  Electronically Signed     TT/MEDQ  D:  01/15/2009  T:  01/16/2009  Job:  [id number]   cc:   [name], M.D.

## 2011-01-31 NOTE — Op Note (Signed)
NAME:  [name], [name]                              ACCOUNT NO.:  [id number]   MEDICAL RECORD NO.:  [id number]                   PATIENT TYPE:  AMB   LOCATION:  ENDO                                 FACILITY:  [facility]   PHYSICIAN:  James L. Rolla Flatten., M.D.          DATE OF BIRTH:  [birth year]   DATE OF PROCEDURE:  09/22/2003  DATE OF DISCHARGE:                                 OPERATIVE REPORT   PROCEDURE:  Esophagogastroduodenoscopy.   MEDICATIONS:  8 mL total of fentanyl 125 mcg and Versed 60 mg IV.  Inadvertently subcu for the procedure, it is unclear how much she received.   INDICATIONS FOR PROCEDURE:  The patient has persistent esophageal reflux  despite the use of Nexium b.i.d., hydrocodone.  Has a lot of pulmonary  issues and various medications with chronic constipation.  This is done as  part of a reflux workup.   DESCRIPTION OF PROCEDURE:  The procedure had been explained to the patient  and consent obtained.  With the patient in the left lateral decubitus  position, the Olympus scope was inserted and advanced.  The stomach was  entered, pyloris identified and passed.  The duodenum including the bulb and  second portion were seen well and were unremarkable.  No ulceration or  inflammation.  The second duodenum was seen in its entirety.  The pyloric  channel was completely normal as was the antrum and body.  The fundus and  cardia of the stomach were seen well in the retroflexed view and were  normal.  The distal and proximal esophagus were seen well and were  endoscopically normal.  There was no significant hiatal hernia.  There was  no evidence of Barrett's esophagus.  The scope was withdrawn and the patient  tolerated the procedure well.   ASSESSMENT:  1. Epigastric pain.  789.06  It is not clear that this is due to reflux.  It     may be due to gastroparesis and altered GI motility.  She does have a     severe problem with constipation.   PLAN:  Will continue her on MiraLax  for now.  Will give Reglan 10 mg a.c.  and h.s.  Continue her other medicines and see her back in the office in  three to four weeks.                                               James L. Rolla Flatten., M.D.    [name]  D:  09/22/2003  T:  09/23/2003  Job:  [id number]   cc:   [name], M.D.  [address] [place]  [place] [address]  Fax: [phone]

## 2011-01-31 NOTE — Procedures (Signed)
[facility]  Patient:    [name], [name] Visit Number: [id number] MRN: [id number]          Service Type: END Location: ENDO Attending Physician:  Sherrin Daisy Dictated by:   Joyice Faster. Oletta Lamas, M.D. Proc. Date: 08/19/01 Admit Date:  08/19/2001 Discharge Date: 08/19/2001   CC:         Judithann Sauger, M.D.  Selinda Orion, M.D.  Isabel Caprice Hassell Done, M.D.   Procedure Report  PROCEDURE:  Colonoscopy.  MEDICATIONS:  Fentanyl 100 mcg, Versed 15 mg IV.  SCOPE:  Pediatric Olympus colonoscope.  INDICATION:  Persistent abdominal pain with intermittent rectal bleeding, negative CT and barium enema.  Does have a family history of colon polyps, and mother had rectal bleeding.  DESCRIPTION OF PROCEDURE:  The procedure had been explained to the patient and consent obtained.  With the patient in the left lateral decubitus position, the Olympus pediatric video colonoscope inserted and advanced under direct visualization.  Prep excellent.  We were able to advance around to the cecum easily.  The ileocecal valve and appendiceal orifice were seen.  The scope was withdrawn and the cecum, ascending colon, hepatic flexure, transverse colon, splenic flexure, descending, and sigmoid colon were seen well.  There was no diverticular disease, no evidence of polyps.  The scope was withdrawn.  The patient tolerated the procedure well.  ASSESSMENT: 1. No evidence of polyps or anything else to explain her abdominal pain. 2. Internal hemorrhoids, probably the source of her bleeding.  PLAN:  Treat with Miralax to keep her stools soft, and she stated that this is the best that she has felt after she was cleaned out with the prep.  See her back in the office in January. Dictated by:   Joyice Faster. Oletta Lamas, M.D. Attending Physician:  Sherrin Daisy DD:  08/19/01 TD:  08/19/01 Job: [id number]
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
## 2011-01-31 NOTE — Op Note (Signed)
[facility]  Patient:    [name], [name]                      MRN: [id number] Adm. Date:  [id number] Attending:  Tye Savoy                           Operative Report  PREOPERATIVE DIAGNOSIS:  Herniated nucleus pulposus at L3-4 left.  POSTOPERATIVE DIAGNOSIS:  Herniated nucleus pulposus at L3-4 left.  OPERATION:  Microdiskectomy at L3-4 left, proximal, medial, hemifascitectomy.  INDICATIONS FOR PROCEDURE:  A 50 year old with L3 radiculopathy secondary to herniated nucleus pulposus at L3-4.  Confirmed with MRI.  Selective nerve root block gave her temporary relief.  Operative intervention is indicated for decompression of three roots to diminish her leg pain.  She has failed conservative treatment, including epidural steroid injections.  Risks and benefits discussed, including bleeding, infection, damage of vascular structures, CSF leak, ______ fibrosis, etc.  Also the possibility of needing a fusion in the future.  DESCRIPTION OF PROCEDURE:  The patient was in supine position.  After successful administration of general anesthesia, ______ ______ prophylaxis, the patient was ______.  Following ______, all bony prominences were well padded.  The lumbar region was prepped and draped in the usual sterile fashion.  Two 18 gauge spinal needles were utilized to localize the 3-4 interspace, confirmed with x-ray.  Incision was made from the spinous process at L3-4, subcutaneous tissue was dissected.  Electrocautery was utilized to achieve hemostasis.  The dorsal lumbar fascia was identified and divided in line with the skin incision.  The paraspinous muscles were elevated from the lamina of L3-4.  McCulley retractors placed.  A confirmatory radiograph obtained from the inner laminar space at L3-4.  A high speed burr was utilized to performed a hemilaminotomy at L3.  Ligamentum flavum was removed from the interspace.  Severe lateral recessed stenosis  was noted.  This was depressed in the L4 root in the lateral recess as well.  The 4 root was identified in the foramen of 4, gently mobilized medially.  Extensive epidural venous plexus and bipolar cautery was utilized to achieve strict hemostasis.  The high speed burr was utilized to form a partial medial hemifacetectomy which was then completed with the 2 mm Kerrison, sparing the pars of the lamina.  This was required due to a foraminal disk herniation that extended from the medial to the lateral border of the pedicle.  This was found to compress the 3 root into the foramen.  Next, an annulotomy was performed with the 15 blade and a copious force of disk material was removed from the disk space and out onto the foraminal region where a small focal disk herniation was noted compressing the 3 root.  Following diskectomy, the 3 root and the 4 root were freely mobile without evidence of residual compression.  The disk space was copiously irrigated.  Prior to this, a confirmatory radiograph from the 3-4 disk space. The wound copiously irrigated.  Inspection revealed no evidence of CSF leak or active bleeding.  The axilla of the root beneath the thecal sac, 3 and 4 foramen, were examined, without evidence of residual compression.  Next, thrombin soaked Gelfoam was placed in laminotomy defect.  Kohler retractor was removed.  Paraspinous muscles inspected, and there was no active bleeding. Dorsolumbar fascia reapproximated with #1 Vicryl ______ sutures. Subcutaneous tissue repaired with 2-0 Vicryl sutures.  Skin was  repaired with staples.  The wound was dressed sterilely.  The patient remained supine on the hospital bed, extubated without difficulty, and transported to the recovery room in satisfactory condition. DD:  10/14/00 TD:  10/14/00 Job: [id number]
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
## 2011-01-31 NOTE — Discharge Summary (Signed)
[name]                  ACCOUNT NO.:  [id number]   MEDICAL RECORD NO.:  [id number]          PATIENT TYPE:  EMS   LOCATION:  MAJO                         FACILITY:  [facility]   PHYSICIAN:  Fransico Him, M.D.     DATE OF BIRTH:  [birth year]   DATE OF ADMISSION:  03/10/2009  DATE OF DISCHARGE:  03/10/2009                               DISCHARGE SUMMARY   DISCHARGE DIAGNOSES:  1. Chest pain, felt to be noncardiac in nature.  2. Chronic back pain.  3. Migraines.  4. Hypertension.  5. Pre-diabetic.  6. Allergic rhinitis.  7. Gastroesophageal reflux disease.  8. Seasonal allergies.  9. History of diverticulitis.   HOSPITAL COURSE:  [name] is a 50 year old female who had a recent  cardiac catheterization for chest pain, was found to have a 60-70%  narrowing of the circumflex.  The other coronary arteries were patent.  A decision was made to hold off on any intervention.  She had been doing  well until the day of admission where she began having pain in her left  breast that would radiate into her back.  We admitted her and ultimately  performed a cardiac catheterization for any areas of ischemia, and there  was no evidence of ischemia.  Her EF was 64%.   Lab work during that time revealed normal cardiac isoenzymes.  Sodium  141, potassium 3.6, BUN 9, creatinine 0.75.  Hemoglobin 14.3, hematocrit  41.0, white count 9.4, platelets 212.   She was discharged to home in stable but improved condition.   To follow with Dr. Radford Pax on Feb 05, 2009, at 3:45 p.m.   Increase activity slowly.   MEDICATIONS:  1. Xanax 0.5 mg 2 tablets daily.  2. Nexium daily.  3. Estrace 1 mg daily.  4. Benicar 40 mg a day.  5. Percocet 10 mg daily.  6. Fentanyl 0.5 mcg daily.  7. Lexapro 20 mg daily.  8. Antibiotics as before.  9. Imdur 30 mg 2 tablets daily.  10.Stool softener daily.  11.Flonase daily.      Joesphine Bare, P.A.      Fransico Him, M.D.  Electronically Signed    LB/MEDQ  D:  03/13/2009  T:  03/14/2009  Job:  [id number]

## 2011-04-08 ENCOUNTER — Other Ambulatory Visit (HOSPITAL_COMMUNITY): Payer: Self-pay | Admitting: Cardiology

## 2011-04-08 DIAGNOSIS — Z01818 Encounter for other preprocedural examination: Secondary | ICD-10-CM

## 2011-04-16 ENCOUNTER — Emergency Department (HOSPITAL_COMMUNITY): Payer: Medicare Other

## 2011-04-16 ENCOUNTER — Emergency Department (HOSPITAL_COMMUNITY)
Admission: EM | Admit: 2011-04-16 | Discharge: 2011-04-16 | Disposition: A | Discharge status: Home / Self Care | Payer: Medicare Other | Attending: Emergency Medicine | Admitting: Emergency Medicine

## 2011-04-16 DIAGNOSIS — I10 Essential (primary) hypertension: Secondary | ICD-10-CM | POA: Insufficient documentation

## 2011-04-16 DIAGNOSIS — J449 Chronic obstructive pulmonary disease, unspecified: Secondary | ICD-10-CM | POA: Insufficient documentation

## 2011-04-16 DIAGNOSIS — Z7982 Long term (current) use of aspirin: Secondary | ICD-10-CM | POA: Insufficient documentation

## 2011-04-16 DIAGNOSIS — Z86718 Personal history of other venous thrombosis and embolism: Secondary | ICD-10-CM | POA: Insufficient documentation

## 2011-04-16 DIAGNOSIS — J4489 Other specified chronic obstructive pulmonary disease: Secondary | ICD-10-CM | POA: Insufficient documentation

## 2011-04-16 DIAGNOSIS — Z79899 Other long term (current) drug therapy: Secondary | ICD-10-CM | POA: Insufficient documentation

## 2011-04-16 DIAGNOSIS — I2510 Atherosclerotic heart disease of native coronary artery without angina pectoris: Secondary | ICD-10-CM | POA: Insufficient documentation

## 2011-04-16 DIAGNOSIS — I509 Heart failure, unspecified: Secondary | ICD-10-CM | POA: Insufficient documentation

## 2011-04-16 DIAGNOSIS — R1032 Left lower quadrant pain: Secondary | ICD-10-CM | POA: Insufficient documentation

## 2011-04-16 DIAGNOSIS — K219 Gastro-esophageal reflux disease without esophagitis: Secondary | ICD-10-CM | POA: Insufficient documentation

## 2011-04-16 DIAGNOSIS — G8929 Other chronic pain: Secondary | ICD-10-CM | POA: Insufficient documentation

## 2011-04-16 LAB — URINALYSIS, ROUTINE W REFLEX MICROSCOPIC
Hgb urine dipstick: NEGATIVE
Leukocytes, UA: NEGATIVE
Nitrite: NEGATIVE
Specific Gravity, Urine: 1.008 (ref 1.005–1.030)
Urobilinogen, UA: 0.2 mg/dL (ref 0.0–1.0)

## 2011-04-16 LAB — COMPREHENSIVE METABOLIC PANEL
ALT: 14 U/L (ref 0–35)
AST: 18 U/L (ref 0–37)
Albumin: 3.1 g/dL — ABNORMAL LOW (ref 3.5–5.2)
Alkaline Phosphatase: 100 U/L (ref 39–117)
Glucose, Bld: 121 mg/dL — ABNORMAL HIGH (ref 70–99)
Potassium: 3.5 mEq/L (ref 3.5–5.1)
Sodium: 140 mEq/L (ref 135–145)
Total Protein: 7.7 g/dL (ref 6.0–8.3)

## 2011-04-16 LAB — DIFFERENTIAL
Lymphocytes Relative: 17 % (ref 12–46)
Lymphs Abs: 2.5 10*3/uL (ref 0.7–4.0)
Monocytes Absolute: 0.5 10*3/uL (ref 0.1–1.0)
Monocytes Relative: 3 % (ref 3–12)
Neutro Abs: 11.6 10*3/uL — ABNORMAL HIGH (ref 1.7–7.7)
Neutrophils Relative %: 79 % — ABNORMAL HIGH (ref 43–77)

## 2011-04-16 LAB — CBC
HCT: 42.4 % (ref 36.0–46.0)
Hemoglobin: 14.5 g/dL (ref 12.0–15.0)
MCH: 30.7 pg (ref 26.0–34.0)
MCHC: 34.2 g/dL (ref 30.0–36.0)
MCV: 89.6 fL (ref 78.0–100.0)
RBC: 4.73 MIL/uL (ref 3.87–5.11)

## 2011-04-22 ENCOUNTER — Encounter (HOSPITAL_COMMUNITY)
Admission: RE | Admit: 2011-04-22 | Discharge: 2011-04-22 | Disposition: A | Discharge status: Home / Self Care | Payer: Medicare Other | Source: Ambulatory Visit | Attending: Cardiology | Admitting: Cardiology

## 2011-04-22 DIAGNOSIS — I10 Essential (primary) hypertension: Secondary | ICD-10-CM | POA: Insufficient documentation

## 2011-04-22 DIAGNOSIS — Z01818 Encounter for other preprocedural examination: Secondary | ICD-10-CM

## 2011-04-22 DIAGNOSIS — Z01810 Encounter for preprocedural cardiovascular examination: Secondary | ICD-10-CM | POA: Insufficient documentation

## 2011-04-22 DIAGNOSIS — Z8249 Family history of ischemic heart disease and other diseases of the circulatory system: Secondary | ICD-10-CM | POA: Insufficient documentation

## 2011-04-23 ENCOUNTER — Ambulatory Visit (HOSPITAL_COMMUNITY)
Admission: RE | Admit: 2011-04-23 | Discharge: 2011-04-23 | Disposition: A | Discharge status: Home / Self Care | Payer: Medicare Other | Source: Ambulatory Visit | Attending: Cardiology | Admitting: Cardiology

## 2011-04-23 DIAGNOSIS — F17200 Nicotine dependence, unspecified, uncomplicated: Secondary | ICD-10-CM | POA: Insufficient documentation

## 2011-04-23 DIAGNOSIS — J449 Chronic obstructive pulmonary disease, unspecified: Secondary | ICD-10-CM | POA: Insufficient documentation

## 2011-04-23 DIAGNOSIS — E7800 Pure hypercholesterolemia, unspecified: Secondary | ICD-10-CM | POA: Insufficient documentation

## 2011-04-23 DIAGNOSIS — Z01810 Encounter for preprocedural cardiovascular examination: Secondary | ICD-10-CM | POA: Insufficient documentation

## 2011-04-23 DIAGNOSIS — Z8249 Family history of ischemic heart disease and other diseases of the circulatory system: Secondary | ICD-10-CM | POA: Insufficient documentation

## 2011-04-23 DIAGNOSIS — J4489 Other specified chronic obstructive pulmonary disease: Secondary | ICD-10-CM | POA: Insufficient documentation

## 2011-04-23 DIAGNOSIS — I10 Essential (primary) hypertension: Secondary | ICD-10-CM | POA: Insufficient documentation

## 2011-04-23 MED ORDER — TECHNETIUM TC 99M TETROFOSMIN IV KIT
30.0000 | PACK | Freq: Once | INTRAVENOUS | Status: AC | PRN
  Administered 2011-04-23: 30 via INTRAVENOUS

## 2011-05-20 ENCOUNTER — Emergency Department (HOSPITAL_COMMUNITY)
Admission: EM | Admit: 2011-05-20 | Discharge: 2011-05-20 | Disposition: A | Discharge status: Home / Self Care | Payer: Medicare Other | Attending: Emergency Medicine | Admitting: Emergency Medicine

## 2011-05-20 ENCOUNTER — Emergency Department (HOSPITAL_COMMUNITY): Payer: Medicare Other

## 2011-05-20 DIAGNOSIS — Z86711 Personal history of pulmonary embolism: Secondary | ICD-10-CM | POA: Insufficient documentation

## 2011-05-20 DIAGNOSIS — J4489 Other specified chronic obstructive pulmonary disease: Secondary | ICD-10-CM | POA: Insufficient documentation

## 2011-05-20 DIAGNOSIS — R51 Headache: Secondary | ICD-10-CM | POA: Insufficient documentation

## 2011-05-20 DIAGNOSIS — Z9981 Dependence on supplemental oxygen: Secondary | ICD-10-CM | POA: Insufficient documentation

## 2011-05-20 DIAGNOSIS — I10 Essential (primary) hypertension: Secondary | ICD-10-CM | POA: Insufficient documentation

## 2011-05-20 DIAGNOSIS — J449 Chronic obstructive pulmonary disease, unspecified: Secondary | ICD-10-CM | POA: Insufficient documentation

## 2011-05-20 DIAGNOSIS — I509 Heart failure, unspecified: Secondary | ICD-10-CM | POA: Insufficient documentation

## 2011-05-20 DIAGNOSIS — K219 Gastro-esophageal reflux disease without esophagitis: Secondary | ICD-10-CM | POA: Insufficient documentation

## 2011-05-20 DIAGNOSIS — I2510 Atherosclerotic heart disease of native coronary artery without angina pectoris: Secondary | ICD-10-CM | POA: Insufficient documentation

## 2011-05-20 LAB — DIFFERENTIAL
Eosinophils Relative: 2 % (ref 0–5)
Lymphocytes Relative: 34 % (ref 12–46)
Lymphs Abs: 3.7 10*3/uL (ref 0.7–4.0)
Monocytes Absolute: 0.6 10*3/uL (ref 0.1–1.0)
Monocytes Relative: 5 % (ref 3–12)
Neutro Abs: 6.2 10*3/uL (ref 1.7–7.7)

## 2011-05-20 LAB — PROTIME-INR
INR: 1.11 (ref 0.00–1.49)
Prothrombin Time: 14.5 seconds (ref 11.6–15.2)

## 2011-05-20 LAB — CBC
HCT: 41.4 % (ref 36.0–46.0)
Hemoglobin: 13.7 g/dL (ref 12.0–15.0)
MCHC: 33.1 g/dL (ref 30.0–36.0)
MCV: 93 fL (ref 78.0–100.0)
RDW: 13.8 % (ref 11.5–15.5)

## 2011-05-20 LAB — CK TOTAL AND CKMB (NOT AT ARMC): Relative Index: INVALID (ref 0.0–2.5)

## 2011-06-13 LAB — URINALYSIS, ROUTINE W REFLEX MICROSCOPIC
Hgb urine dipstick: NEGATIVE
Protein, ur: NEGATIVE
Specific Gravity, Urine: 1.011
Urobilinogen, UA: 0.2

## 2011-06-13 LAB — DIFFERENTIAL
Basophils Relative: 1
Eosinophils Absolute: 0.2
Lymphs Abs: 4.5 — ABNORMAL HIGH
Monocytes Relative: 6
Neutro Abs: 4.2
Neutrophils Relative %: 44

## 2011-06-13 LAB — CBC
MCHC: 33.5
MCV: 91.6
Platelets: 227
RBC: 5.04
WBC: 9.6

## 2011-06-13 LAB — POCT I-STAT, CHEM 8
Calcium, Ion: 0.98 — ABNORMAL LOW
HCT: 47 — ABNORMAL HIGH
Hemoglobin: 16 — ABNORMAL HIGH
Sodium: 137
TCO2: 32

## 2011-06-13 LAB — OCCULT BLOOD X 1 CARD TO LAB, STOOL: Fecal Occult Bld: NEGATIVE

## 2011-06-16 LAB — CBC
HCT: 40.3
HCT: 41.3
MCV: 90.4
Platelets: 215
RBC: 4.45
RDW: 13.1
WBC: 7.7
WBC: 8.4

## 2011-06-16 LAB — DIFFERENTIAL
Basophils Relative: 0
Basophils Relative: 0
Eosinophils Relative: 1
Lymphocytes Relative: 39
Monocytes Absolute: 0.5
Monocytes Relative: 6
Monocytes Relative: 6
Neutro Abs: 4.6
Neutro Abs: 4.8
Neutrophils Relative %: 62

## 2011-06-16 LAB — COMPREHENSIVE METABOLIC PANEL
AST: 35
Albumin: 3.3 — ABNORMAL LOW
Alkaline Phosphatase: 53
Alkaline Phosphatase: 55
BUN: 5 — ABNORMAL LOW
BUN: 8
Calcium: 8 — ABNORMAL LOW
Creatinine, Ser: 0.7
GFR calc Af Amer: >60
Glucose, Bld: 91
Potassium: 2.9 — ABNORMAL LOW
Potassium: 3.3 — ABNORMAL LOW
Total Protein: 6
Total Protein: 6.7

## 2011-06-16 LAB — URINALYSIS, ROUTINE W REFLEX MICROSCOPIC
Glucose, UA: NEGATIVE
Glucose, UA: NEGATIVE
Hgb urine dipstick: NEGATIVE
Ketones, ur: NEGATIVE
Nitrite: NEGATIVE
Protein, ur: NEGATIVE
Specific Gravity, Urine: 1.013
Urobilinogen, UA: 0.2

## 2011-06-18 ENCOUNTER — Other Ambulatory Visit: Payer: Self-pay | Admitting: Family Medicine

## 2011-06-18 DIAGNOSIS — N63 Unspecified lump in unspecified breast: Secondary | ICD-10-CM

## 2011-06-20 LAB — URINALYSIS, ROUTINE W REFLEX MICROSCOPIC
Bilirubin Urine: NEGATIVE
Glucose, UA: NEGATIVE mg/dL
Glucose, UA: NEGATIVE mg/dL
Hgb urine dipstick: NEGATIVE
Ketones, ur: NEGATIVE mg/dL
Nitrite: NEGATIVE
Protein, ur: NEGATIVE mg/dL
Specific Gravity, Urine: 1.009 (ref 1.005–1.030)
Specific Gravity, Urine: 1.011 (ref 1.005–1.030)
Urobilinogen, UA: 0.2 mg/dL (ref 0.0–1.0)
pH: 6 (ref 5.0–8.0)
pH: 6.5 (ref 5.0–8.0)

## 2011-06-20 LAB — CBC
HCT: 46.9 % — ABNORMAL HIGH (ref 36.0–46.0)
Hemoglobin: 15.8 g/dL — ABNORMAL HIGH (ref 12.0–15.0)
MCHC: 33.6 g/dL (ref 30.0–36.0)
MCV: 91.6 fL (ref 78.0–100.0)
MCV: 92.4 fL (ref 78.0–100.0)
Platelets: 258 K/uL (ref 150–400)
RBC: 4.81 MIL/uL (ref 3.87–5.11)
RBC: 5.08 MIL/uL (ref 3.87–5.11)
RDW: 13.5 % (ref 11.5–15.5)
WBC: 10.5 K/uL (ref 4.0–10.5)
WBC: 9.7 10*3/uL (ref 4.0–10.5)

## 2011-06-20 LAB — DIFFERENTIAL
Basophils Absolute: 0.1 K/uL (ref 0.0–0.1)
Basophils Relative: 1 % (ref 0–1)
Eosinophils Absolute: 0.1 10*3/uL (ref 0.0–0.7)
Eosinophils Absolute: 0.2 10*3/uL (ref 0.0–0.7)
Eosinophils Relative: 1 % (ref 0–5)
Lymphocytes Relative: 32 % (ref 12–46)
Lymphs Abs: 3.4 10*3/uL (ref 0.7–4.0)
Lymphs Abs: 4 10*3/uL (ref 0.7–4.0)
Monocytes Absolute: 0.5 K/uL (ref 0.1–1.0)
Monocytes Relative: 5 % (ref 3–12)
Monocytes Relative: 5 % (ref 3–12)
Neutro Abs: 5 10*3/uL (ref 1.7–7.7)
Neutro Abs: 6.4 10*3/uL (ref 1.7–7.7)
Neutrophils Relative %: 51 % (ref 43–77)
Neutrophils Relative %: 61 % (ref 43–77)

## 2011-06-20 LAB — COMPREHENSIVE METABOLIC PANEL
ALT: 30 U/L (ref 0–35)
AST: 39 U/L — ABNORMAL HIGH (ref 0–37)
Albumin: 3.6 g/dL (ref 3.5–5.2)
Alkaline Phosphatase: 68 U/L (ref 39–117)
Calcium: 9.2 mg/dL (ref 8.4–10.5)
Chloride: 93 mEq/L — ABNORMAL LOW (ref 96–112)
Creatinine, Ser: 0.79 mg/dL (ref 0.4–1.2)
GFR calc Af Amer: >60 mL/min (ref >60)
GFR calc non Af Amer: >60 mL/min (ref >60)
Glucose, Bld: 100 mg/dL — ABNORMAL HIGH (ref 70–99)
Potassium: 3 mEq/L — ABNORMAL LOW (ref 3.5–5.1)
Sodium: 134 mEq/L — ABNORMAL LOW (ref 135–145)
Sodium: 140 mEq/L (ref 135–145)
Total Bilirubin: 0.3 mg/dL (ref 0.3–1.2)
Total Protein: 7.6 g/dL (ref 6.0–8.3)

## 2011-06-20 LAB — POCT I-STAT, CHEM 8
BUN: 11 mg/dL (ref 6–23)
Creatinine, Ser: 1 mg/dL (ref 0.4–1.2)
Glucose, Bld: 95 mg/dL (ref 70–99)
Hemoglobin: 15.3 g/dL — ABNORMAL HIGH (ref 12.0–15.0)
Potassium: 3.3 mEq/L — ABNORMAL LOW (ref 3.5–5.1)
TCO2: 32 mmol/L (ref 0–100)

## 2011-06-20 LAB — COMPREHENSIVE METABOLIC PANEL WITH GFR
AST: 58 U/L — ABNORMAL HIGH (ref 0–37)
Albumin: 3.9 g/dL (ref 3.5–5.2)
Alkaline Phosphatase: 62 U/L (ref 39–117)
BUN: 4 mg/dL — ABNORMAL LOW (ref 6–23)
CO2: 33 meq/L — ABNORMAL HIGH (ref 19–32)
Chloride: 97 meq/L (ref 96–112)
GFR calc Af Amer: >60 mL/min (ref >60)
Potassium: 3.3 meq/L — ABNORMAL LOW (ref 3.5–5.1)
Total Bilirubin: 0.8 mg/dL (ref 0.3–1.2)

## 2011-06-20 LAB — LIPASE, BLOOD: Lipase: 29 U/L (ref 11–59)

## 2011-06-27 LAB — POCT RAPID STREP A: Streptococcus, Group A Screen (Direct): NEGATIVE

## 2011-06-30 ENCOUNTER — Ambulatory Visit
Admission: RE | Admit: 2011-06-30 | Discharge: 2011-06-30 | Disposition: A | Discharge status: Home / Self Care | Payer: Medicare Other | Source: Ambulatory Visit | Attending: Family Medicine | Admitting: Family Medicine

## 2011-06-30 DIAGNOSIS — N63 Unspecified lump in unspecified breast: Secondary | ICD-10-CM

## 2011-07-21 ENCOUNTER — Other Ambulatory Visit: Payer: Self-pay | Admitting: Family Medicine

## 2011-07-21 DIAGNOSIS — Z1231 Encounter for screening mammogram for malignant neoplasm of breast: Secondary | ICD-10-CM

## 2011-09-19 ENCOUNTER — Ambulatory Visit
Admission: RE | Admit: 2011-09-19 | Discharge: 2011-09-19 | Disposition: A | Discharge status: Home / Self Care | Payer: Medicare Other | Source: Ambulatory Visit | Attending: Family Medicine | Admitting: Family Medicine

## 2011-09-19 DIAGNOSIS — Z1231 Encounter for screening mammogram for malignant neoplasm of breast: Secondary | ICD-10-CM

## 2011-10-01 DIAGNOSIS — R209 Unspecified disturbances of skin sensation: Secondary | ICD-10-CM | POA: Diagnosis not present

## 2011-10-09 DIAGNOSIS — G4731 Primary central sleep apnea: Secondary | ICD-10-CM | POA: Diagnosis not present

## 2011-10-10 DIAGNOSIS — Z79899 Other long term (current) drug therapy: Secondary | ICD-10-CM | POA: Diagnosis not present

## 2011-10-10 DIAGNOSIS — E7800 Pure hypercholesterolemia, unspecified: Secondary | ICD-10-CM | POA: Diagnosis not present

## 2011-10-23 ENCOUNTER — Other Ambulatory Visit: Payer: Self-pay | Admitting: Surgery

## 2011-10-23 DIAGNOSIS — D485 Neoplasm of uncertain behavior of skin: Secondary | ICD-10-CM | POA: Diagnosis not present

## 2011-10-23 DIAGNOSIS — D1801 Hemangioma of skin and subcutaneous tissue: Secondary | ICD-10-CM | POA: Diagnosis not present

## 2011-10-23 DIAGNOSIS — L259 Unspecified contact dermatitis, unspecified cause: Secondary | ICD-10-CM | POA: Diagnosis not present

## 2011-10-23 DIAGNOSIS — L988 Other specified disorders of the skin and subcutaneous tissue: Secondary | ICD-10-CM | POA: Diagnosis not present

## 2011-10-28 DIAGNOSIS — I10 Essential (primary) hypertension: Secondary | ICD-10-CM | POA: Diagnosis not present

## 2011-10-28 DIAGNOSIS — M545 Low back pain: Secondary | ICD-10-CM | POA: Diagnosis not present

## 2011-10-28 DIAGNOSIS — J449 Chronic obstructive pulmonary disease, unspecified: Secondary | ICD-10-CM | POA: Diagnosis not present

## 2011-10-28 DIAGNOSIS — R110 Nausea: Secondary | ICD-10-CM | POA: Diagnosis not present

## 2011-10-28 DIAGNOSIS — E119 Type 2 diabetes mellitus without complications: Secondary | ICD-10-CM | POA: Diagnosis not present

## 2011-10-28 DIAGNOSIS — Z87891 Personal history of nicotine dependence: Secondary | ICD-10-CM | POA: Diagnosis not present

## 2011-10-28 DIAGNOSIS — R609 Edema, unspecified: Secondary | ICD-10-CM | POA: Diagnosis not present

## 2011-10-28 DIAGNOSIS — Z77121 Contact with and (suspected) exposure to harmful algae and algae toxins: Secondary | ICD-10-CM | POA: Diagnosis not present

## 2011-10-28 DIAGNOSIS — Z9189 Other specified personal risk factors, not elsewhere classified: Secondary | ICD-10-CM | POA: Diagnosis not present

## 2011-10-29 DIAGNOSIS — E662 Morbid (severe) obesity with alveolar hypoventilation: Secondary | ICD-10-CM | POA: Diagnosis not present

## 2011-10-29 DIAGNOSIS — G4731 Primary central sleep apnea: Secondary | ICD-10-CM | POA: Diagnosis not present

## 2011-10-29 DIAGNOSIS — G4734 Idiopathic sleep related nonobstructive alveolar hypoventilation: Secondary | ICD-10-CM | POA: Diagnosis not present

## 2011-10-29 DIAGNOSIS — M25569 Pain in unspecified knee: Secondary | ICD-10-CM | POA: Diagnosis not present

## 2011-11-01 IMAGING — CR DG CHEST 1V PORT
1 series · 1 of 1 positions shown · non-contrast
Comparison: 12/15/2010

CLINICAL DATA: Shortness of breath with difficulty breathing for 1
month.

PORTABLE CHEST - 1 VIEW

[AP]
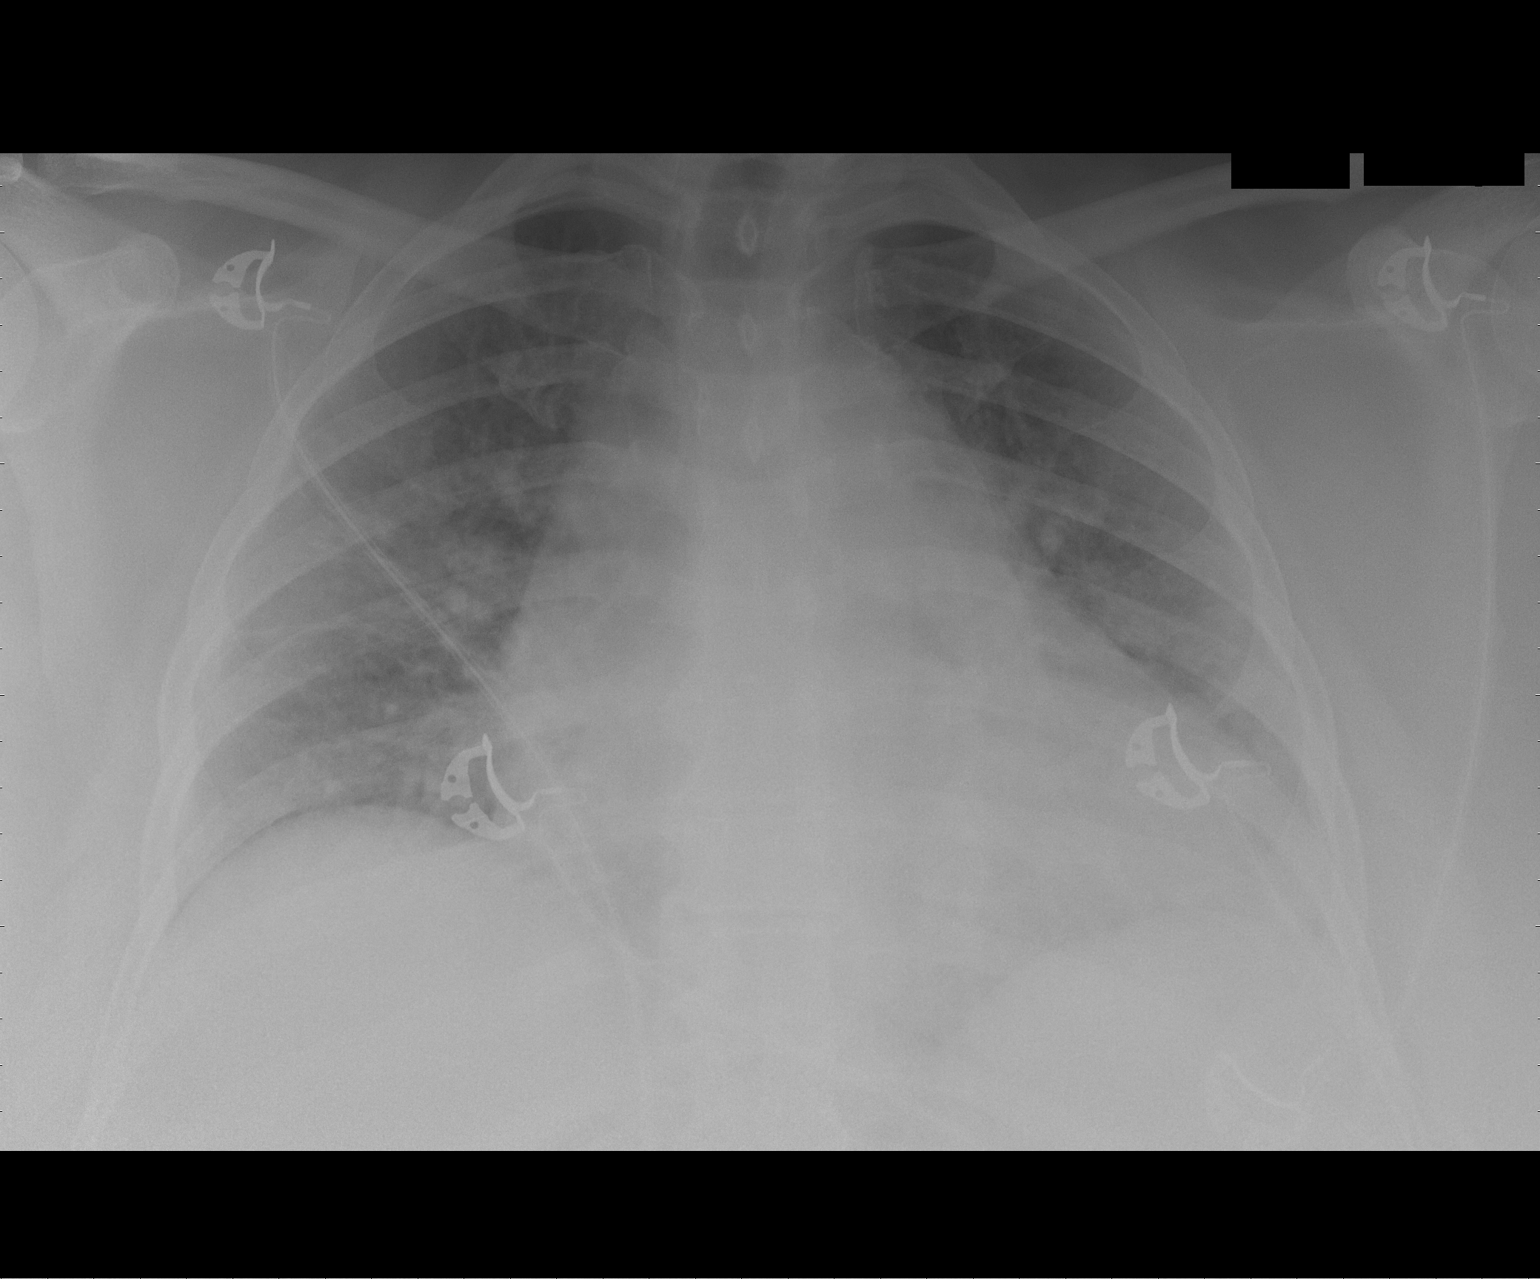

[1 of 1 positions shown; findings below may reference images not displayed]

FINDINGS: Low lung volumes.  Cardiomegaly.  Interstitial and
alveolar prominence consistent with pulmonary edema.  Worsening
aeration compared with priors.  No effusion or pneumothorax.
IMPRESSION: Cardiomegaly with early CHF.

## 2011-11-05 DIAGNOSIS — J449 Chronic obstructive pulmonary disease, unspecified: Secondary | ICD-10-CM | POA: Diagnosis not present

## 2011-11-05 DIAGNOSIS — G4737 Central sleep apnea in conditions classified elsewhere: Secondary | ICD-10-CM | POA: Diagnosis not present

## 2011-11-05 DIAGNOSIS — I509 Heart failure, unspecified: Secondary | ICD-10-CM | POA: Diagnosis not present

## 2011-11-09 ENCOUNTER — Encounter (HOSPITAL_COMMUNITY): Payer: Self-pay | Admitting: Emergency Medicine

## 2011-11-09 ENCOUNTER — Emergency Department (HOSPITAL_COMMUNITY)
Admission: EM | Admit: 2011-11-09 | Discharge: 2011-11-09 | Disposition: A | Discharge status: Home Health | Payer: Medicare Other | Attending: Emergency Medicine | Admitting: Emergency Medicine

## 2011-11-09 DIAGNOSIS — M25569 Pain in unspecified knee: Secondary | ICD-10-CM | POA: Diagnosis not present

## 2011-11-09 DIAGNOSIS — J449 Chronic obstructive pulmonary disease, unspecified: Secondary | ICD-10-CM | POA: Insufficient documentation

## 2011-11-09 DIAGNOSIS — F17200 Nicotine dependence, unspecified, uncomplicated: Secondary | ICD-10-CM | POA: Diagnosis not present

## 2011-11-09 DIAGNOSIS — M79609 Pain in unspecified limb: Secondary | ICD-10-CM | POA: Insufficient documentation

## 2011-11-09 DIAGNOSIS — I10 Essential (primary) hypertension: Secondary | ICD-10-CM | POA: Diagnosis not present

## 2011-11-09 DIAGNOSIS — E669 Obesity, unspecified: Secondary | ICD-10-CM | POA: Insufficient documentation

## 2011-11-09 DIAGNOSIS — J4489 Other specified chronic obstructive pulmonary disease: Secondary | ICD-10-CM | POA: Insufficient documentation

## 2011-11-09 DIAGNOSIS — R209 Unspecified disturbances of skin sensation: Secondary | ICD-10-CM | POA: Diagnosis not present

## 2011-11-09 DIAGNOSIS — K219 Gastro-esophageal reflux disease without esophagitis: Secondary | ICD-10-CM | POA: Insufficient documentation

## 2011-11-09 DIAGNOSIS — G4730 Sleep apnea, unspecified: Secondary | ICD-10-CM | POA: Insufficient documentation

## 2011-11-09 DIAGNOSIS — J342 Deviated nasal septum: Secondary | ICD-10-CM | POA: Insufficient documentation

## 2011-11-09 DIAGNOSIS — M4310 Spondylolisthesis, site unspecified: Secondary | ICD-10-CM | POA: Insufficient documentation

## 2011-11-09 DIAGNOSIS — M545 Low back pain, unspecified: Secondary | ICD-10-CM | POA: Insufficient documentation

## 2011-11-09 DIAGNOSIS — I2510 Atherosclerotic heart disease of native coronary artery without angina pectoris: Secondary | ICD-10-CM | POA: Insufficient documentation

## 2011-11-09 DIAGNOSIS — G8929 Other chronic pain: Secondary | ICD-10-CM | POA: Insufficient documentation

## 2011-11-09 HISTORY — DX: Sleep apnea, unspecified: G47.30

## 2011-11-09 HISTORY — DX: Perforation of intestine (nontraumatic): K63.1

## 2011-11-09 HISTORY — DX: Spondylolisthesis, site unspecified: M43.10

## 2011-11-09 HISTORY — DX: Deviated nasal septum: J34.2

## 2011-11-09 LAB — URINALYSIS, ROUTINE W REFLEX MICROSCOPIC
Hgb urine dipstick: NEGATIVE
Leukocytes, UA: NEGATIVE
Nitrite: NEGATIVE
Protein, ur: NEGATIVE mg/dL
Specific Gravity, Urine: 1.01 (ref 1.005–1.030)
Urobilinogen, UA: 0.2 mg/dL (ref 0.0–1.0)

## 2011-11-09 MED ORDER — SODIUM CHLORIDE 0.9 % IV SOLN
Freq: Once | INTRAVENOUS | Status: AC
  Administered 2011-11-09: 07:00:00 via INTRAVENOUS

## 2011-11-09 MED ORDER — HYDROMORPHONE HCL PF 2 MG/ML IJ SOLN
1.0000 mg | Freq: Once | INTRAMUSCULAR | Status: AC
  Administered 2011-11-09: 1 mg via INTRAVENOUS
  Filled 2011-11-09: qty 1

## 2011-11-09 MED ORDER — HYDROMORPHONE HCL PF 2 MG/ML IJ SOLN
2.0000 mg | Freq: Once | INTRAMUSCULAR | Status: AC
  Administered 2011-11-09: 2 mg via INTRAVENOUS
  Filled 2011-11-09: qty 1

## 2011-11-09 NOTE — ED Notes (Signed)
Doppler at bedside.

## 2011-11-09 NOTE — ED Provider Notes (Signed)
Medical screening examination/treatment/procedure(s) were performed by non-physician practitioner and as supervising physician I was immediately available for consultation/collaboration.    Dot Lanes, MD 11/09/11 [phone]

## 2011-11-09 NOTE — ED Notes (Signed)
Patient complaining of lower back pain and left knee pain.  Patient states that she fell four weeks ago; patient had to have fluid pulled off her left knee.  Patient has history of sciatic nerve pain and chronic back pain.  Patient states that her pain has increasingly gotten worse of the course of the last four days.  Patient states that her pain medication (for her chronic pain) does not help with the pain.

## 2011-11-09 NOTE — Progress Notes (Signed)
VASCULAR LAB PRELIMINARY  PRELIMINARY  PRELIMINARY  PRELIMINARY  Left lower extremity venous duplex completed.    Preliminary report:  Left:  No evidence of DVT, superficial thrombosis, or Baker's cyst.  Judithann Sauger, RVT 11/09/2011, 8:43 AM

## 2011-11-09 NOTE — ED Notes (Signed)
Pt placed on monitor d/t unable to remain awake during assessment.

## 2011-11-09 NOTE — ED Provider Notes (Signed)
History     CSN: [id number]  Arrival date & time 11/09/11  0551  HPI Pt reports 4 weeks ago she fell off the toilet. States she injured her left knee and lower back. Patient reports a history of chronic back pain. Reports she goes to pain management and receives 40 mg of OxyContin and 10-325 Percocet. Reports since then has had persistent worsening low back pain. States the low back sends sharp shooting pains down to her left knee. Patient reports chronic numbness and tingling. States this is worse on the right side but unchanged. Denies new abdominal pain, nausea, vomiting, fever, perineal numbness, incontinence. States Dr Azucena Kuba with [facility] advised patient to come to ED if home meds did not help pain. Patient is a 51 y.o. female presenting with back pain. The history is provided by the patient.  Back Pain  This is a new problem. Episode onset: 4 weeks. The problem occurs constantly. The problem has been gradually worsening. The pain is associated with falling. The pain is present in the lumbar spine. The quality of the pain is described as shooting and stabbing. The pain radiates to the left thigh and left knee. The pain is severe. The symptoms are aggravated by bending and certain positions. Associated symptoms include numbness, abdominal pain (chronic) and leg pain. Pertinent negatives include no chest pain, no fever, no headaches, no bowel incontinence, no perianal numbness, no bladder incontinence, no dysuria, no pelvic pain, no paresthesias, no paresis, no tingling and no weakness. Associated symptoms comments: Right sided numbness and tingling new. But none on the left. Reports Left saddle anesthsias. She has tried muscle relaxants, analgesics, NSAIDs and bed rest for the symptoms. The treatment provided no relief. Risk factors include obesity.    Past Medical History  Diagnosis Date  . Obesity   . Hypertension   . GERD (gastroesophageal reflux disease)   . Chronic pain   .  Pulmonary embolism   . COPD (chronic obstructive pulmonary disease)   . Tobacco abuse   . CAD (coronary artery disease)   . Spondylolisthesis, grade 2   . Deviated septum   . Sleep apnea   . Perforated bowel     Past Surgical History  Procedure Date  . Back surgery   . Abdominal hysterectomy   . Colon surgery     History reviewed. No pertinent family history.  History  Substance Use Topics  . Smoking status: Current Everyday Smoker -- 1.0 packs/day  . Smokeless tobacco: Not on file  . Alcohol Use: No    OB History    Grav Para Term Preterm Abortions TAB SAB Ect Mult Living                  Review of Systems  Constitutional: Negative for fever and chills.  HENT: Negative for neck pain and neck stiffness.   Cardiovascular: Negative for chest pain.  Gastrointestinal: Positive for abdominal pain (chronic). Negative for nausea, vomiting and bowel incontinence.  Genitourinary: Negative for bladder incontinence, dysuria, urgency, frequency, hematuria, flank pain and pelvic pain.  Musculoskeletal: Positive for back pain.       Denies saddle anesthesias, or perineal numbness, urinary or bowel incontinence  Neurological: Positive for numbness. Negative for tingling, weakness, headaches and paresthesias.    Allergies  Amoxicillin; Bactrim; Clindamycin/lincomycin; Doxycycline; Klor-con; and Treximet  Home Medications   Current Outpatient Rx  Name Route Sig Dispense Refill  . ALBUTEROL SULFATE HFA 108 (90 BASE) MCG/ACT IN AERS Inhalation Inhale 2 puffs  into the lungs every 6 (six) hours as needed. Shortness of breath    . ALPRAZOLAM 1 MG PO TABS Oral Take 0.5-1 mg by mouth 3 (three) times daily as needed. Can take half tab at breakfast and in the afternoon Usually only takes 1 tab at bedtime May use for anxiety and sleep    . ASPIRIN EC 81 MG PO TBEC Oral Take 81 mg by mouth daily.    Marland Kitchen CLOTRIMAZOLE-BETAMETHASONE 1-0.05 % EX CREA Topical Apply 1 application topically at  bedtime and may repeat dose one time if needed. Apply to hands    . CYCLOBENZAPRINE HCL 10 MG PO TABS Oral Take 10 mg by mouth 2 (two) times daily. Muscle spasm, scheduled dose    . ESOMEPRAZOLE MAGNESIUM 40 MG PO CPDR Oral Take 40 mg by mouth 2 (two) times daily.     Marland Kitchen ESTRADIOL 2 MG PO TABS Oral Take 2 mg by mouth daily.      Marland Kitchen FLUTICASONE PROPIONATE 50 MCG/ACT NA SUSP Nasal Place 2 sprays into the nose daily.     Marland Kitchen HYDROCORTISONE 2.5 % RE CREA Rectal Place 1 application rectally 2 (two) times daily as needed. For pain due to hemmorroids    . LIDOCAINE 5 % EX PTCH Transdermal Place 1-3 patches onto the skin daily. Remove & Discard patch within 12 hours or as directed by MD For pain    . LIDOCAINE 5 % EX OINT Topical Apply 1 application topically daily. To hemmorroids    . MELOXICAM 15 MG PO TABS Oral Take 15 mg by mouth daily as needed. Knee pain and inflammation    . METOLAZONE 10 MG PO TABS Oral Take 10 mg by mouth daily. May take a 2nd dose for excessive edema    . NYSTOP 100000 UNIT/GM EX POWD Topical Apply 1 g topically 3 (three) times daily as needed. To rashy areas    . OLMESARTAN MEDOXOMIL 40 MG PO TABS Oral Take 40 mg by mouth daily.     . OLOPATADINE HCL 0.1 % OP SOLN Both Eyes Place 1 drop into both eyes 2 (two) times daily.    Marland Kitchen PATADAY OP Both Eyes Place 1 drop into both eyes daily as needed. May use in addition to the Patanol drop for extreme dryness and crustiness    . OXYCODONE HCL ER 40 MG PO TB12 Oral Take 40 mg by mouth every 12 (twelve) hours. Pain, scheduled dose    . OXYCODONE-ACETAMINOPHEN 10-325 MG PO TABS Oral Take 1 tablet by mouth every 4 (four) hours. Pain, scheduled doses    . POLYETHYLENE GLYCOL 3350 PO PACK Oral Take 17 g by mouth daily. For constipation    . POTASSIUM CHLORIDE CRYS ER 10 MEQ PO TBCR Oral Take 10 mEq by mouth daily.    Marland Kitchen PROMETHAZINE HCL 25 MG PO TABS Oral Take 25 mg by mouth every 6 (six) hours as needed. nausea    . SIMETHICONE 180 MG PO CAPS  Oral Take 1 capsule by mouth 4 (four) times daily as needed. For gas/bloating    . SIMVASTATIN 20 MG PO TABS Oral Take 20 mg by mouth every evening.      BP 112/57  Pulse 98  Temp(Src) 97.5 F (36.4 C) (Oral)  Resp 20  SpO2 100%  Physical Exam  Vitals reviewed. Constitutional: She is oriented to person, place, and time. Vital signs are normal. She appears well-developed and well-nourished.  HENT:  Head: Normocephalic and atraumatic.  Eyes: Conjunctivae are  normal. Pupils are equal, round, and reactive to light.  Neck: Normal range of motion. Neck supple.  Cardiovascular: Normal rate, regular rhythm and normal heart sounds.  Exam reveals no friction rub.   No murmur heard. Pulmonary/Chest: Effort normal and breath sounds normal. She has no wheezes. She has no rhonchi. She has no rales. She exhibits no tenderness.  Musculoskeletal: Normal range of motion.       Lumbar back: She exhibits tenderness and pain. She exhibits normal range of motion, no bony tenderness and no swelling.       Back:       Legs: Neurological: She is alert and oriented to person, place, and time. Coordination normal.  Skin: Skin is warm and dry. No rash noted. No erythema. No pallor.    ED Course  Procedures   MDM   Patient reports she has chronic pain and was advised to come here by primary care physician for short-term relief since she is RA on chronic pain medications will treat with 2 mg IV Dilaudid which patient states usually relieves her pain   7:40 AM Patient reports pain is now an 8/10. Patient reports she is concerned about her calf pain which has not improved at all. Patient has a history of ability to to fall he. Will obtain Dopplers of her left lower extremity to rule out DVT.  11:31 AM Patient's pain returned a little after dopplers. Will Give another 1mg  dilaudid and d/c. Dopplers are negative for DVT. Likely this is chronic pain exacerbation.    Thomasene Lot, PA-C 11/09/11 1133

## 2011-11-09 NOTE — ED Notes (Addendum)
Observed pt ambulate with her cane to the restroom with no issues.

## 2011-11-09 NOTE — Discharge Instructions (Signed)
Chronic Back Pain When back pain lasts longer than 3 months, it is called chronic back pain.This pain can be frustrating, but the cause of the pain is rarely dangerous.People with chronic back pain often go through certain periods that are more intense (flare-ups). CAUSES Chronic back pain can be caused by wear and tear (degeneration) on different structures in your back. These structures may include bones, ligaments, or discs. This degeneration may result in more pressure being placed on the nerves that travel to your legs and feet. This can lead to pain traveling from the low back down the back of the legs. When pain lasts longer than 3 months, it is not unusual for people to experience anxiety or depression. Anxiety and depression can also contribute to low back pain. TREATMENT  Establish a regular exercise plan. This is critical to improving your functional level.   Have a self-management plan for when you flare-up. Flare-ups rarely require a medical visit. Regular exercise will help reduce the intensity and frequency of your flare-ups.   Manage how you feel about your back pain and the rest of your life. Anxiety, depression, and feeling that you cannot alter your back pain have been shown to make back pain more intense and debilitating.   Medicines should never be your only treatment. They should be used along with other treatments to help you return to a more active lifestyle.   Procedures such as injections or surgery may be helpful but are rarely necessary. You may be able to get the same results with physical therapy or chiropractic care.  HOME CARE INSTRUCTIONS  Avoid bending, heavy lifting, prolonged sitting, and activities which make the problem worse.   Continue normal activity as much as possible.   Take brief periods of rest throughout the day to reduce your pain during flare-ups.   Follow your back exercise rehabilitation program. This can help reduce symptoms and prevent  more pain.   Only take over-the-counter or prescription medicines as directed by your caregiver. Muscle relaxants are sometimes prescribed. Narcotic pain medicine is discouraged for long-term pain, since addiction is a possible outcome.   If you smoke, quit.   Eat healthy foods and maintain a recommended body weight.  SEEK IMMEDIATE MEDICAL CARE IF:   You have weakness or numbness in one of your legs or feet.   You have trouble controlling your bladder or bowels.   You develop nausea, vomiting, abdominal pain, shortness of breath, or fainting.  Document Released: 10/09/2004 Document Revised: 05/14/2011 Document Reviewed: 01/20/2011 [facility] Patient Information 2012 [place].

## 2011-11-09 NOTE — ED Notes (Signed)
Bridgette PA is waiting for spouse to arrive to ED before discharging pt home.

## 2011-11-14 DIAGNOSIS — M961 Postlaminectomy syndrome, not elsewhere classified: Secondary | ICD-10-CM | POA: Diagnosis not present

## 2011-11-14 DIAGNOSIS — M5137 Other intervertebral disc degeneration, lumbosacral region: Secondary | ICD-10-CM | POA: Diagnosis not present

## 2011-11-27 DIAGNOSIS — G541 Lumbosacral plexus disorders: Secondary | ICD-10-CM | POA: Diagnosis not present

## 2011-11-27 DIAGNOSIS — G894 Chronic pain syndrome: Secondary | ICD-10-CM | POA: Diagnosis not present

## 2011-11-27 DIAGNOSIS — Z79899 Other long term (current) drug therapy: Secondary | ICD-10-CM | POA: Diagnosis not present

## 2011-11-27 DIAGNOSIS — G5790 Unspecified mononeuropathy of unspecified lower limb: Secondary | ICD-10-CM | POA: Diagnosis not present

## 2011-11-27 DIAGNOSIS — F411 Generalized anxiety disorder: Secondary | ICD-10-CM | POA: Diagnosis not present

## 2011-12-25 DIAGNOSIS — G5790 Unspecified mononeuropathy of unspecified lower limb: Secondary | ICD-10-CM | POA: Diagnosis not present

## 2011-12-25 DIAGNOSIS — G894 Chronic pain syndrome: Secondary | ICD-10-CM | POA: Diagnosis not present

## 2011-12-25 DIAGNOSIS — G541 Lumbosacral plexus disorders: Secondary | ICD-10-CM | POA: Diagnosis not present

## 2011-12-25 DIAGNOSIS — F411 Generalized anxiety disorder: Secondary | ICD-10-CM | POA: Diagnosis not present

## 2012-01-07 DIAGNOSIS — G4737 Central sleep apnea in conditions classified elsewhere: Secondary | ICD-10-CM | POA: Diagnosis not present

## 2012-01-07 DIAGNOSIS — I509 Heart failure, unspecified: Secondary | ICD-10-CM | POA: Diagnosis not present

## 2012-01-07 DIAGNOSIS — J449 Chronic obstructive pulmonary disease, unspecified: Secondary | ICD-10-CM | POA: Diagnosis not present

## 2012-01-15 DIAGNOSIS — K921 Melena: Secondary | ICD-10-CM | POA: Diagnosis not present

## 2012-01-15 DIAGNOSIS — K5649 Other impaction of intestine: Secondary | ICD-10-CM | POA: Diagnosis not present

## 2012-01-15 DIAGNOSIS — K648 Other hemorrhoids: Secondary | ICD-10-CM | POA: Diagnosis not present

## 2012-01-22 DIAGNOSIS — I10 Essential (primary) hypertension: Secondary | ICD-10-CM | POA: Diagnosis not present

## 2012-01-22 DIAGNOSIS — R609 Edema, unspecified: Secondary | ICD-10-CM | POA: Diagnosis not present

## 2012-01-22 DIAGNOSIS — E7800 Pure hypercholesterolemia, unspecified: Secondary | ICD-10-CM | POA: Diagnosis not present

## 2012-01-22 DIAGNOSIS — R7309 Other abnormal glucose: Secondary | ICD-10-CM | POA: Diagnosis not present

## 2012-01-22 DIAGNOSIS — Z79899 Other long term (current) drug therapy: Secondary | ICD-10-CM | POA: Diagnosis not present

## 2012-01-23 DIAGNOSIS — F411 Generalized anxiety disorder: Secondary | ICD-10-CM | POA: Diagnosis not present

## 2012-01-23 DIAGNOSIS — G894 Chronic pain syndrome: Secondary | ICD-10-CM | POA: Diagnosis not present

## 2012-01-23 DIAGNOSIS — G5790 Unspecified mononeuropathy of unspecified lower limb: Secondary | ICD-10-CM | POA: Diagnosis not present

## 2012-01-23 DIAGNOSIS — G541 Lumbosacral plexus disorders: Secondary | ICD-10-CM | POA: Diagnosis not present

## 2012-01-26 DIAGNOSIS — M25569 Pain in unspecified knee: Secondary | ICD-10-CM | POA: Diagnosis not present

## 2012-01-27 DIAGNOSIS — M545 Low back pain: Secondary | ICD-10-CM | POA: Diagnosis not present

## 2012-02-02 DIAGNOSIS — K130 Diseases of lips: Secondary | ICD-10-CM | POA: Diagnosis not present

## 2012-02-02 DIAGNOSIS — R609 Edema, unspecified: Secondary | ICD-10-CM | POA: Diagnosis not present

## 2012-02-02 DIAGNOSIS — F411 Generalized anxiety disorder: Secondary | ICD-10-CM | POA: Diagnosis not present

## 2012-02-02 DIAGNOSIS — E119 Type 2 diabetes mellitus without complications: Secondary | ICD-10-CM | POA: Diagnosis not present

## 2012-02-02 DIAGNOSIS — J449 Chronic obstructive pulmonary disease, unspecified: Secondary | ICD-10-CM | POA: Diagnosis not present

## 2012-02-02 DIAGNOSIS — M545 Low back pain: Secondary | ICD-10-CM | POA: Diagnosis not present

## 2012-02-13 DIAGNOSIS — J449 Chronic obstructive pulmonary disease, unspecified: Secondary | ICD-10-CM | POA: Diagnosis not present

## 2012-02-13 DIAGNOSIS — G4737 Central sleep apnea in conditions classified elsewhere: Secondary | ICD-10-CM | POA: Diagnosis not present

## 2012-02-20 DIAGNOSIS — Z79899 Other long term (current) drug therapy: Secondary | ICD-10-CM | POA: Diagnosis not present

## 2012-02-20 DIAGNOSIS — G541 Lumbosacral plexus disorders: Secondary | ICD-10-CM | POA: Diagnosis not present

## 2012-02-20 DIAGNOSIS — M542 Cervicalgia: Secondary | ICD-10-CM | POA: Diagnosis not present

## 2012-02-20 DIAGNOSIS — G894 Chronic pain syndrome: Secondary | ICD-10-CM | POA: Diagnosis not present

## 2012-02-20 DIAGNOSIS — F411 Generalized anxiety disorder: Secondary | ICD-10-CM | POA: Diagnosis not present

## 2012-02-20 DIAGNOSIS — G5790 Unspecified mononeuropathy of unspecified lower limb: Secondary | ICD-10-CM | POA: Diagnosis not present

## 2012-03-04 DIAGNOSIS — R609 Edema, unspecified: Secondary | ICD-10-CM | POA: Diagnosis not present

## 2012-03-04 DIAGNOSIS — M79609 Pain in unspecified limb: Secondary | ICD-10-CM | POA: Diagnosis not present

## 2012-03-12 ENCOUNTER — Emergency Department (HOSPITAL_COMMUNITY): Payer: Medicare Other

## 2012-03-12 ENCOUNTER — Emergency Department (HOSPITAL_COMMUNITY)
Admission: EM | Admit: 2012-03-12 | Discharge: 2012-03-12 | Disposition: A | Discharge status: Home / Self Care | Payer: Medicare Other | Attending: Emergency Medicine | Admitting: Emergency Medicine

## 2012-03-12 ENCOUNTER — Encounter (HOSPITAL_COMMUNITY): Payer: Self-pay | Admitting: Emergency Medicine

## 2012-03-12 DIAGNOSIS — F17200 Nicotine dependence, unspecified, uncomplicated: Secondary | ICD-10-CM | POA: Insufficient documentation

## 2012-03-12 DIAGNOSIS — I2510 Atherosclerotic heart disease of native coronary artery without angina pectoris: Secondary | ICD-10-CM | POA: Diagnosis not present

## 2012-03-12 DIAGNOSIS — R609 Edema, unspecified: Secondary | ICD-10-CM

## 2012-03-12 DIAGNOSIS — I10 Essential (primary) hypertension: Secondary | ICD-10-CM | POA: Insufficient documentation

## 2012-03-12 DIAGNOSIS — K219 Gastro-esophageal reflux disease without esophagitis: Secondary | ICD-10-CM | POA: Insufficient documentation

## 2012-03-12 DIAGNOSIS — Z7982 Long term (current) use of aspirin: Secondary | ICD-10-CM | POA: Diagnosis not present

## 2012-03-12 DIAGNOSIS — R5383 Other fatigue: Secondary | ICD-10-CM | POA: Diagnosis not present

## 2012-03-12 DIAGNOSIS — J4489 Other specified chronic obstructive pulmonary disease: Secondary | ICD-10-CM | POA: Insufficient documentation

## 2012-03-12 DIAGNOSIS — Z79899 Other long term (current) drug therapy: Secondary | ICD-10-CM | POA: Insufficient documentation

## 2012-03-12 DIAGNOSIS — E669 Obesity, unspecified: Secondary | ICD-10-CM | POA: Diagnosis not present

## 2012-03-12 DIAGNOSIS — G8929 Other chronic pain: Secondary | ICD-10-CM | POA: Diagnosis not present

## 2012-03-12 DIAGNOSIS — J449 Chronic obstructive pulmonary disease, unspecified: Secondary | ICD-10-CM | POA: Diagnosis not present

## 2012-03-12 DIAGNOSIS — E876 Hypokalemia: Secondary | ICD-10-CM | POA: Insufficient documentation

## 2012-03-12 DIAGNOSIS — J984 Other disorders of lung: Secondary | ICD-10-CM | POA: Diagnosis not present

## 2012-03-12 DIAGNOSIS — R5381 Other malaise: Secondary | ICD-10-CM | POA: Diagnosis not present

## 2012-03-12 DIAGNOSIS — G4730 Sleep apnea, unspecified: Secondary | ICD-10-CM | POA: Insufficient documentation

## 2012-03-12 LAB — CBC WITH DIFFERENTIAL/PLATELET
Basophils Absolute: 0 10*3/uL (ref 0.0–0.1)
Eosinophils Relative: 2 % (ref 0–5)
Lymphocytes Relative: 35 % (ref 12–46)
Lymphs Abs: 3.3 10*3/uL (ref 0.7–4.0)
MCV: 90.7 fL (ref 78.0–100.0)
Neutro Abs: 5.3 10*3/uL (ref 1.7–7.7)
Neutrophils Relative %: 56 % (ref 43–77)
Platelets: 197 10*3/uL (ref 150–400)
RBC: 4.32 MIL/uL (ref 3.87–5.11)
RDW: 13.9 % (ref 11.5–15.5)
WBC: 9.4 10*3/uL (ref 4.0–10.5)

## 2012-03-12 LAB — URINALYSIS, ROUTINE W REFLEX MICROSCOPIC
Glucose, UA: NEGATIVE mg/dL
Hgb urine dipstick: NEGATIVE
Leukocytes, UA: NEGATIVE
Protein, ur: NEGATIVE mg/dL
Specific Gravity, Urine: 1.016 (ref 1.005–1.030)

## 2012-03-12 LAB — BASIC METABOLIC PANEL
CO2: 41 mEq/L (ref 19–32)
Calcium: 8.7 mg/dL (ref 8.4–10.5)
Chloride: 90 mEq/L — ABNORMAL LOW (ref 96–112)
Glucose, Bld: 145 mg/dL — ABNORMAL HIGH (ref 70–99)
Potassium: 2.6 mEq/L — CL (ref 3.5–5.1)
Sodium: 138 mEq/L (ref 135–145)

## 2012-03-12 MED ORDER — OXYCODONE-ACETAMINOPHEN 5-325 MG PO TABS
2.0000 | ORAL_TABLET | Freq: Once | ORAL | Status: AC
  Administered 2012-03-12: 2 via ORAL
  Filled 2012-03-12: qty 2

## 2012-03-12 MED ORDER — OXYCODONE HCL 40 MG PO TB12
40.0000 mg | ORAL_TABLET | Freq: Once | ORAL | Status: AC
  Administered 2012-03-12: 40 mg via ORAL
  Filled 2012-03-12: qty 1

## 2012-03-12 MED ORDER — POTASSIUM CHLORIDE CRYS ER 20 MEQ PO TBCR
20.0000 meq | EXTENDED_RELEASE_TABLET | Freq: Once | ORAL | Status: AC
  Administered 2012-03-12: 20 meq via ORAL
  Filled 2012-03-12: qty 1

## 2012-03-12 MED ORDER — FUROSEMIDE 20 MG PO TABS
40.0000 mg | ORAL_TABLET | Freq: Once | ORAL | Status: AC
  Administered 2012-03-12: 40 mg via ORAL
  Filled 2012-03-12: qty 2

## 2012-03-12 MED ORDER — POTASSIUM CHLORIDE CRYS ER 20 MEQ PO TBCR
40.0000 meq | EXTENDED_RELEASE_TABLET | Freq: Once | ORAL | Status: AC
  Administered 2012-03-12: 40 meq via ORAL
  Filled 2012-03-12: qty 2

## 2012-03-12 MED ORDER — TORSEMIDE 20 MG PO TABS
80.0000 mg | ORAL_TABLET | Freq: Once | ORAL | Status: AC
  Administered 2012-03-12: 80 mg via ORAL
  Filled 2012-03-12: qty 4

## 2012-03-12 NOTE — ED Notes (Signed)
Pt. Placed on 2 L nasal canula due to stats at 91%

## 2012-03-12 NOTE — ED Notes (Signed)
MD aware of critical lab values. Pending further orders.

## 2012-03-12 NOTE — ED Notes (Signed)
Pt. To x-ray .

## 2012-03-12 NOTE — ED Provider Notes (Signed)
History     CSN: [id number]  Arrival date & time 03/12/12  0263   First MD Initiated Contact with Patient 03/12/12 0710      Chief Complaint  Patient presents with  . Leg Pain    (Consider location/radiation/quality/duration/timing/severity/associated sxs/prior treatment) HPI Comments: [name] is a 51 y.o. Female here for peripheral edema, with redness ofher legs, for several days. She also feels weak. Weakness has caused her to fall several times.. She has not injured herself. Not unusual for her. She has a chronic gait disability, and uses a walker.   She did not eat his morning, or take her morning medications. She denies headache, chest pain, shortness of breath, back, pain, change in bowel or urinary habits.    The history is provided by the patient.    Past Medical History  Diagnosis Date  . Obesity   . Hypertension   . GERD (gastroesophageal reflux disease)   . Chronic pain   . Pulmonary embolism   . COPD (chronic obstructive pulmonary disease)   . Tobacco abuse   . CAD (coronary artery disease)   . Spondylolisthesis, grade 2   . Deviated septum   . Sleep apnea   . Perforated bowel     Past Surgical History  Procedure Date  . Back surgery   . Abdominal hysterectomy   . Colon surgery     No family history on file.  History  Substance Use Topics  . Smoking status: Current Everyday Smoker -- 1.0 packs/day  . Smokeless tobacco: Not on file  . Alcohol Use: No    OB History    Grav Para Term Preterm Abortions TAB SAB Ect Mult Living                  Review of Systems  All other systems reviewed and are negative.    Allergies  Amoxicillin; Bactrim; Clindamycin/lincomycin; Doxycycline; Potassium chloride er; and Treximet  Home Medications   Current Outpatient Rx  Name Route Sig Dispense Refill  . ALBUTEROL SULFATE HFA 108 (90 BASE) MCG/ACT IN AERS Inhalation Inhale 2 puffs into the lungs every 4 (four) hours as needed. Shortness of breath      . ALPRAZOLAM 1 MG PO TABS Oral Take 0.5-1 mg by mouth 3 (three) times daily as needed. Can take half tab at breakfast and in the afternoon Usually only takes 1 tab at bedtime May use for anxiety and sleep    . ASPIRIN EC 81 MG PO TBEC Oral Take 81 mg by mouth daily.    Marland Kitchen CLOTRIMAZOLE-BETAMETHASONE 1-0.05 % EX CREA Topical Apply 1 application topically at bedtime and may repeat dose one time if needed. Apply to hands    . CYCLOBENZAPRINE HCL 10 MG PO TABS Oral Take 10 mg by mouth 2 (two) times daily. Muscle spasm, scheduled dose    . ESOMEPRAZOLE MAGNESIUM 40 MG PO CPDR Oral Take 40 mg by mouth 2 (two) times daily.     Marland Kitchen ESTRADIOL 2 MG PO TABS Oral Take 2 mg by mouth daily.      Marland Kitchen FLUTICASONE PROPIONATE 50 MCG/ACT NA SUSP Nasal Place 2 sprays into the nose daily.     . FUROSEMIDE 40 MG PO TABS Oral Take 40 mg by mouth daily.    Marland Kitchen HYDROCORTISONE 2.5 % RE CREA Rectal Place 1 application rectally 2 (two) times daily as needed. For pain due to hemmorroids    . LIDOCAINE 5 % EX PTCH Transdermal Place 1-3 patches  onto the skin daily. Remove & Discard patch within 12 hours or as directed by MD For pain    . LIDOCAINE 5 % EX OINT Topical Apply 1 application topically daily. To hemmorroids    . MELOXICAM 15 MG PO TABS Oral Take 15 mg by mouth daily as needed. Knee pain and inflammation    . METOLAZONE 10 MG PO TABS Oral Take 10 mg by mouth daily. May take a 2nd dose for excessive edema    . NYSTATIN 100000 UNIT/GM EX POWD Topical Apply 1 g topically 3 (three) times daily as needed. To rashy areas    . OLOPATADINE HCL 0.1 % OP SOLN Both Eyes Place 1 drop into both eyes 2 (two) times daily.    Marland Kitchen PATADAY OP Both Eyes Place 1 drop into both eyes daily as needed. May use in addition to the Patanol drop for extreme dryness and crustiness    . OXYCODONE HCL ER 40 MG PO TB12 Oral Take 40 mg by mouth every 12 (twelve) hours. Pain, scheduled dose    . OXYCODONE-ACETAMINOPHEN 10-325 MG PO TABS Oral Take 1 tablet by  mouth every 4 (four) hours. Pain, scheduled doses    . POLYETHYLENE GLYCOL 3350 PO PACK Oral Take 17 g by mouth daily. For constipation    . POTASSIUM CHLORIDE CRYS ER 10 MEQ PO TBCR Oral Take 10 mEq by mouth daily.    Marland Kitchen PROMETHAZINE HCL 25 MG PO TABS Oral Take 25 mg by mouth every 6 (six) hours as needed. nausea    . SIMETHICONE 180 MG PO CAPS Oral Take 1 capsule by mouth 4 (four) times daily as needed. For gas/bloating    . TORSEMIDE 20 MG PO TABS Oral Take 80 mg by mouth daily.    Marland Kitchen ZOLPIDEM TARTRATE ER 12.5 MG PO TBCR Oral Take 12.5 mg by mouth at bedtime as needed. For sleep      BP 115/74  Pulse 86  Temp 97.9 F (36.6 C) (Oral)  Resp 18  SpO2 96%  Physical Exam  Nursing note and vitals reviewed. Constitutional: She is oriented to person, place, and time. She appears well-developed and well-nourished. No distress.  HENT:  Head: Normocephalic and atraumatic.  Eyes: Conjunctivae and EOM are normal. Pupils are equal, round, and reactive to light.  Neck: Normal range of motion and phonation normal. Neck supple.  Cardiovascular: Normal rate, regular rhythm and intact distal pulses.   Pulmonary/Chest: Effort normal and breath sounds normal. She exhibits no tenderness.  Abdominal: Soft. She exhibits no distension. There is no tenderness. There is no guarding.  Musculoskeletal: Normal range of motion. She exhibits edema (1+, bilateral pitting edema both lower leg with associated mild, diffuse redness. No proximal streaking.) and tenderness (diffuse lower extremity, tenderness, primarily of the lower legs).  Neurological: She is alert and oriented to person, place, and time. She has normal strength. She exhibits normal muscle tone.  Skin: Skin is warm and dry.  Psychiatric: She has a normal mood and affect. Her behavior is normal. Judgment and thought content normal.    ED Course  Procedures (including critical care time)  Emergency department treatment: A.m. pain. Meds and diuretics  administered.  Reevaluation: 0900-patient is more comfortable. She has no further complaints    Labs Reviewed  BASIC METABOLIC PANEL - Abnormal; Notable for the following:    Potassium 2.6 (*)     Chloride 90 (*)     CO2 41 (*)     Glucose, Bld 145 (*)  All other components within normal limits  URINALYSIS, ROUTINE W REFLEX MICROSCOPIC - Abnormal; Notable for the following:    APPearance HAZY (*)     Bilirubin Urine SMALL (*)     All other components within normal limits  CBC WITH DIFFERENTIAL  URINE CULTURE   Dg Chest 2 View  03/12/2012  *RADIOLOGY REPORT*  Clinical Data: Weakness  CHEST - 2 VIEW  Comparison: 05/20/2011  Findings: Linear scarring in the lingula is unchanged.  Negative for heart failure.  Negative for pneumonia or effusion.  Heart size is upper normal.  IMPRESSION: No active cardiopulmonary disease.  Original Report Authenticated By: Truett Perna, M.D.     1. Peripheral edema   2. Hypokalemia       MDM  Peripheral edema, with secondary redness. Doubt DVT, cellulitis, arterial insufficiency or lymphangitis. Doubt metabolic instability, serious bacterial infection or impending vascular collapse; the patient is stable for discharge.     Plan: Home Medications- increase Pot. To 20 BID x 4 days; Home Treatments- rest, elevate legs; Recommended follow up- PCP f/u 5-7 days        Richarda Blade, MD 03/12/12 [phone]

## 2012-03-12 NOTE — ED Notes (Signed)
Pt. Stated, I've had leg pain lower  They are red off and on and I have fluid in them.  Im not sure if I have a clot or not

## 2012-03-12 NOTE — ED Notes (Signed)
Patient returned from X-ray 

## 2012-03-12 NOTE — Discharge Instructions (Signed)
Take your potassium pills; 20 meq, twice a day for 4 days, then resume the regular dose, 10 meq each day.  Elevate your feet above your heart as much as possible for the next week. Call your doctor today, to schedule an appointment to be seen, next week. Call him if needed for problems   Peripheral Edema You have swelling in your legs (peripheral edema). This swelling is due to excess accumulation of salt and water in your body. Edema may be a sign of heart, kidney or liver disease, or a side effect of a medication. It may also be due to problems in the leg veins. Elevating your legs and using special support stockings may be very helpful, if the cause of the swelling is due to poor venous circulation. Avoid long periods of standing, whatever the cause. Treatment of edema depends on identifying the cause. Chips, pretzels, pickles and other salty foods should be avoided. Restricting salt in your diet is almost always needed. Water pills (diuretics) are often used to remove the excess salt and water from your body via urine. These medicines prevent the kidney from reabsorbing sodium. This increases urine flow. Diuretic treatment may also result in lowering of potassium levels in your body. Potassium supplements may be needed if you have to use diuretics daily. Daily weights can help you keep track of your progress in clearing your edema. You should call your caregiver for follow up care as recommended. SEEK IMMEDIATE MEDICAL CARE IF:   You have increased swelling, pain, redness, or heat in your legs.   You develop shortness of breath, especially when lying down.   You develop chest or abdominal pain, weakness, or fainting.   You have a fever.  Document Released: 10/09/2004 Document Revised: 08/21/2011 Document Reviewed: 09/19/2009 [facility] Patient Information 2012 [place].Hypokalemia Hypokalemia means a low potassium level in the blood. Symptoms may include muscle weakness and cramping,  fatigue, abdominal pain, vomiting, constipation, or irregularities of the heartbeat. Sometimes hypokalemia is discovered by your caregiver if you are taking certain medicines for high blood pressure or kidney disease.  Potassium is an electrolyte that helps regulate the amount of fluid in the body. It also stimulates muscle contraction and maintains a stable acid-base balance. If potassium levels go too low or too high, your health may be in danger. You are at risk for developing shock, heart, and lung problems. Hypokalemia can occur if you have excessive diarrhea, vomiting, or sweating. Potassium can be lost through your kidneys in the urine. Certain common medicines can also cause potassium loss, especially water pills (diuretics). The same is possible with cortisone medications or certain types of antibiotics. Low potassium can be dangerous if you are taking certain heart medicines. In diabetes, your potassium may fall after you take insulin, especially if your diabetes had been out of control for a while. In rare cases, potassium may be low because you are not getting enough in your diet.  In adults, a potassium level below 3.5 mEq/L is usually considered low. Hypokalemia can be treated with potassium supplements taken by mouth and a diet that is high in potassium. Foods with high potassium content are:  Peas, lentils, lima beans, nuts, and dried fruit.   Whole grain and bran cereals and breads.   Fresh fruit and vegetables. Examples include:   Bananas.   Cantaloupe.   Grapefruit.   Oranges.   Tomatoes.   Honeydew melons.   Potatoes.   Peaches.   Orange and tomato juices.  Meats.  See your caregiver as instructed for a follow-up blood test to be sure your potassium is back to normal. SEEK MEDICAL CARE IF:   You have nausea, vomiting, constipation, or abdominal pain.   You have palpitations or irregular heartbeats, chest pain or shortness of breath.   You have muscle cramps  or weakness or fatigue.   You have lethargy.  SEEK IMMEDIATE MEDICAL CARE IF:   You have paralysis.   You have confusion or other mental status changes.  Document Released: 09/01/2005 Document Revised: 08/21/2011 Document Reviewed: 12/26/2009 [facility] Patient Information 2012 [place].

## 2012-03-13 LAB — URINE CULTURE
Colony Count: NO GROWTH
Culture: NO GROWTH

## 2012-03-19 DIAGNOSIS — G894 Chronic pain syndrome: Secondary | ICD-10-CM | POA: Diagnosis not present

## 2012-03-19 DIAGNOSIS — G5790 Unspecified mononeuropathy of unspecified lower limb: Secondary | ICD-10-CM | POA: Diagnosis not present

## 2012-03-19 DIAGNOSIS — F411 Generalized anxiety disorder: Secondary | ICD-10-CM | POA: Diagnosis not present

## 2012-03-19 DIAGNOSIS — G541 Lumbosacral plexus disorders: Secondary | ICD-10-CM | POA: Diagnosis not present

## 2012-03-22 DIAGNOSIS — R609 Edema, unspecified: Secondary | ICD-10-CM | POA: Diagnosis not present

## 2012-03-22 DIAGNOSIS — D692 Other nonthrombocytopenic purpura: Secondary | ICD-10-CM | POA: Diagnosis not present

## 2012-03-23 DIAGNOSIS — M25569 Pain in unspecified knee: Secondary | ICD-10-CM | POA: Diagnosis not present

## 2012-03-29 DIAGNOSIS — M545 Low back pain: Secondary | ICD-10-CM | POA: Diagnosis not present

## 2012-03-29 DIAGNOSIS — M25569 Pain in unspecified knee: Secondary | ICD-10-CM | POA: Diagnosis not present

## 2012-04-06 DIAGNOSIS — Z79899 Other long term (current) drug therapy: Secondary | ICD-10-CM | POA: Diagnosis not present

## 2012-04-06 DIAGNOSIS — I2510 Atherosclerotic heart disease of native coronary artery without angina pectoris: Secondary | ICD-10-CM | POA: Diagnosis not present

## 2012-04-06 DIAGNOSIS — E7800 Pure hypercholesterolemia, unspecified: Secondary | ICD-10-CM | POA: Diagnosis not present

## 2012-04-06 DIAGNOSIS — I10 Essential (primary) hypertension: Secondary | ICD-10-CM | POA: Diagnosis not present

## 2012-04-06 DIAGNOSIS — F17200 Nicotine dependence, unspecified, uncomplicated: Secondary | ICD-10-CM | POA: Diagnosis not present

## 2012-04-07 DIAGNOSIS — M25569 Pain in unspecified knee: Secondary | ICD-10-CM | POA: Diagnosis not present

## 2012-04-15 DIAGNOSIS — R269 Unspecified abnormalities of gait and mobility: Secondary | ICD-10-CM | POA: Diagnosis not present

## 2012-04-15 DIAGNOSIS — G5790 Unspecified mononeuropathy of unspecified lower limb: Secondary | ICD-10-CM | POA: Diagnosis not present

## 2012-04-15 DIAGNOSIS — G894 Chronic pain syndrome: Secondary | ICD-10-CM | POA: Diagnosis not present

## 2012-04-15 DIAGNOSIS — H81399 Other peripheral vertigo, unspecified ear: Secondary | ICD-10-CM | POA: Diagnosis not present

## 2012-04-15 DIAGNOSIS — G541 Lumbosacral plexus disorders: Secondary | ICD-10-CM | POA: Diagnosis not present

## 2012-04-15 DIAGNOSIS — F411 Generalized anxiety disorder: Secondary | ICD-10-CM | POA: Diagnosis not present

## 2012-04-15 DIAGNOSIS — Z9181 History of falling: Secondary | ICD-10-CM | POA: Diagnosis not present

## 2012-04-30 DIAGNOSIS — M47817 Spondylosis without myelopathy or radiculopathy, lumbosacral region: Secondary | ICD-10-CM | POA: Diagnosis not present

## 2012-04-30 DIAGNOSIS — M5137 Other intervertebral disc degeneration, lumbosacral region: Secondary | ICD-10-CM | POA: Diagnosis not present

## 2012-04-30 DIAGNOSIS — M545 Low back pain: Secondary | ICD-10-CM | POA: Diagnosis not present

## 2012-05-14 DIAGNOSIS — G5790 Unspecified mononeuropathy of unspecified lower limb: Secondary | ICD-10-CM | POA: Diagnosis not present

## 2012-05-14 DIAGNOSIS — G894 Chronic pain syndrome: Secondary | ICD-10-CM | POA: Diagnosis not present

## 2012-05-14 DIAGNOSIS — G541 Lumbosacral plexus disorders: Secondary | ICD-10-CM | POA: Diagnosis not present

## 2012-05-14 DIAGNOSIS — F411 Generalized anxiety disorder: Secondary | ICD-10-CM | POA: Diagnosis not present

## 2012-05-16 ENCOUNTER — Encounter (HOSPITAL_COMMUNITY): Payer: Self-pay | Admitting: *Deleted

## 2012-05-16 ENCOUNTER — Emergency Department (HOSPITAL_COMMUNITY)
Admission: EM | Admit: 2012-05-16 | Discharge: 2012-05-16 | Disposition: A | Discharge status: Home / Self Care | Payer: Medicare Other | Attending: Emergency Medicine | Admitting: Emergency Medicine

## 2012-05-16 DIAGNOSIS — I2510 Atherosclerotic heart disease of native coronary artery without angina pectoris: Secondary | ICD-10-CM | POA: Insufficient documentation

## 2012-05-16 DIAGNOSIS — J449 Chronic obstructive pulmonary disease, unspecified: Secondary | ICD-10-CM | POA: Insufficient documentation

## 2012-05-16 DIAGNOSIS — J4489 Other specified chronic obstructive pulmonary disease: Secondary | ICD-10-CM | POA: Insufficient documentation

## 2012-05-16 DIAGNOSIS — M549 Dorsalgia, unspecified: Secondary | ICD-10-CM

## 2012-05-16 DIAGNOSIS — M545 Low back pain, unspecified: Secondary | ICD-10-CM | POA: Diagnosis not present

## 2012-05-16 DIAGNOSIS — Z86711 Personal history of pulmonary embolism: Secondary | ICD-10-CM | POA: Diagnosis not present

## 2012-05-16 DIAGNOSIS — Z880 Allergy status to penicillin: Secondary | ICD-10-CM | POA: Diagnosis not present

## 2012-05-16 DIAGNOSIS — G4730 Sleep apnea, unspecified: Secondary | ICD-10-CM | POA: Insufficient documentation

## 2012-05-16 DIAGNOSIS — G8929 Other chronic pain: Secondary | ICD-10-CM | POA: Insufficient documentation

## 2012-05-16 DIAGNOSIS — I10 Essential (primary) hypertension: Secondary | ICD-10-CM | POA: Diagnosis not present

## 2012-05-16 DIAGNOSIS — F17200 Nicotine dependence, unspecified, uncomplicated: Secondary | ICD-10-CM | POA: Diagnosis not present

## 2012-05-16 MED ORDER — HYDROMORPHONE HCL PF 2 MG/ML IJ SOLN
2.0000 mg | Freq: Once | INTRAMUSCULAR | Status: AC
  Administered 2012-05-16: 2 mg via INTRAMUSCULAR
  Filled 2012-05-16: qty 1

## 2012-05-16 NOTE — ED Notes (Signed)
Pt reports hx of chronic back pain radiating down to left leg, states pain meds at home are not helping. Pt uses cane for ambulation.

## 2012-05-16 NOTE — ED Provider Notes (Addendum)
History     CSN: [id number]  Arrival date & time 05/16/12  4098   First MD Initiated Contact with Patient 05/16/12 [phone]      Chief Complaint  Patient presents with  . Back Pain  . Leg Pain    (Consider location/radiation/quality/duration/timing/severity/associated sxs/prior treatment) HPI Complains of low back pain radiating to left leg and ankle onset several years ago for which she goes to pain clinic and periodically gets injected into her back. Reports pain is improved over last night however pain is still fairly bad described as burning in quality worse with movement improved with remaining still no other complaint. No incontinence no fever no other associated symptoms Past Medical History  Diagnosis Date  . Obesity   . Hypertension   . GERD (gastroesophageal reflux disease)   . Chronic pain   . Pulmonary embolism   . COPD (chronic obstructive pulmonary disease)   . Tobacco abuse   . CAD (coronary artery disease)   . Spondylolisthesis, grade 2   . Deviated septum   . Sleep apnea   . Perforated bowel    herniated lumbar disc  Past Surgical History  Procedure Date  . Back surgery   . Abdominal hysterectomy   . Colon surgery     History reviewed. No pertinent family history.  History  Substance Use Topics  . Smoking status: Current Everyday Smoker -- 1.0 packs/day  . Smokeless tobacco: Not on file  . Alcohol Use: No    OB History    Grav Para Term Preterm Abortions TAB SAB Ect Mult Living                  Review of Systems  Constitutional: Negative.   HENT: Negative.   Respiratory: Negative.   Cardiovascular: Negative.   Gastrointestinal: Negative.   Musculoskeletal: Positive for back pain.  Skin: Negative.   Neurological: Negative.   Hematological: Negative.   Psychiatric/Behavioral: Negative.   All other systems reviewed and are negative.    Allergies  Amoxicillin; Bactrim; Clindamycin/lincomycin; Doxycycline; Potassium chloride er; and  Treximet  Home Medications   Current Outpatient Rx  Name Route Sig Dispense Refill  . ALBUTEROL SULFATE HFA 108 (90 BASE) MCG/ACT IN AERS Inhalation Inhale 2 puffs into the lungs every 4 (four) hours as needed. Shortness of breath/wheezing.    Marland Kitchen ALPRAZOLAM 1 MG PO TABS Oral Take 1-2 mg by mouth 2 (two) times daily. 1 tablet in the morning and 2 tablets at bedtime.    . ASPIRIN 81 MG PO CHEW Oral Chew 81 mg by mouth daily.    . BUMETANIDE 1 MG PO TABS Oral Take 1 mg by mouth daily. Alternates with Lasix.    Marland Kitchen CLOTRIMAZOLE-BETAMETHASONE 1-0.05 % EX CREA Topical Apply 1 application topically at bedtime and may repeat dose one time if needed. Apply to hands    . CYCLOBENZAPRINE HCL 10 MG PO TABS Oral Take 10 mg by mouth 2 (two) times daily. Muscle spasm, scheduled dose    . ESCITALOPRAM OXALATE 10 MG PO TABS Oral Take 10 mg by mouth daily.    Marland Kitchen ESOMEPRAZOLE MAGNESIUM 40 MG PO CPDR Oral Take 40 mg by mouth 2 (two) times daily.     Marland Kitchen ESTRADIOL 2 MG PO TABS Oral Take 2 mg by mouth daily.      Marland Kitchen FLUTICASONE PROPIONATE 50 MCG/ACT NA SUSP Nasal Place 2 sprays into the nose at bedtime.     Marland Kitchen LIDOCAINE 5 % EX PTCH Transdermal Place 1-3  patches onto the skin daily. Remove & Discard patch within 12 hours or as directed by MD For pain    . MELOXICAM 15 MG PO TABS Oral Take 15 mg by mouth daily as needed. Knee pain and inflammation    . METOLAZONE 10 MG PO TABS Oral Take 10 mg by mouth daily. May take a 2nd dose for excessive edema    . NYSTATIN 100000 UNIT/GM EX POWD Topical Apply 1 g topically 3 (three) times daily as needed. To rashy areas    . OLOPATADINE HCL 0.1 % OP SOLN Both Eyes Place 1 drop into both eyes 2 (two) times daily.    Marland Kitchen PATADAY OP Both Eyes Place 1 drop into both eyes daily as needed. May use in addition to the Patanol drop for extreme dryness and crustiness    . OXYCODONE HCL ER 40 MG PO TB12 Oral Take 40 mg by mouth every 12 (twelve) hours. Pain, scheduled dose    .  OXYCODONE-ACETAMINOPHEN 10-325 MG PO TABS Oral Take 1 tablet by mouth every 4 (four) hours. Pain, scheduled doses    . POLYETHYLENE GLYCOL 3350 PO PACK Oral Take 17 g by mouth daily as needed. For constipation    . POTASSIUM CHLORIDE CRYS ER 10 MEQ PO TBCR Oral Take 10 mEq by mouth daily.    Marland Kitchen PROMETHAZINE HCL 25 MG PO TABS Oral Take 25 mg by mouth every 6 (six) hours as needed. nausea    . SIMETHICONE 180 MG PO CAPS Oral Take 1 capsule by mouth 4 (four) times daily as needed. For gas/bloating    . ZOLPIDEM TARTRATE ER 12.5 MG PO TBCR Oral Take 12.5 mg by mouth at bedtime as needed. For sleep    . FUROSEMIDE 40 MG PO TABS Oral Take 40 mg by mouth daily. If Bumex cause sickness patient changes to lasix.    Marland Kitchen HYDROCORTISONE 2.5 % RE CREA Rectal Place 1 application rectally 2 (two) times daily as needed. For pain due to hemmorroids    . LIDOCAINE 5 % EX OINT Topical Apply 1 application topically daily. To hemmorroids      BP 139/82  Pulse 100  Temp 98 F (36.7 C) (Oral)  Resp 18  SpO2 97%  Physical Exam  Nursing note and vitals reviewed. Constitutional: She appears well-developed and well-nourished.  HENT:  Head: Normocephalic and atraumatic.  Eyes: Conjunctivae are normal. Pupils are equal, round, and reactive to light.  Neck: Neck supple. No tracheal deviation present. No thyromegaly present.  Cardiovascular: Normal rate and regular rhythm.   No murmur heard. Pulmonary/Chest: Effort normal and breath sounds normal.  Abdominal: Soft. Bowel sounds are normal. She exhibits no distension. There is no tenderness.       Morbidly obese  Musculoskeletal: Normal range of motion. She exhibits no edema and no tenderness.       No point tenderness along spine midline surgical scar lumbar area  Neurological: She is alert. She has normal reflexes. Coordination normal.       Gait normal walks with cane  Skin: Skin is warm and dry. No rash noted.  Psychiatric: She has a normal mood and affect.     ED Course  Procedures (including critical care time)  Labs Reviewed - No data to display No results found.   No diagnosis found.  10:10 AM patient resting comfortably after treatment with IM hydromorphone. Patient alert Glasgow Coma Score 15 no distress MDM  Plan patient instructed to followup with her pain management  physician this week.Diagnosis chronic back pain        Doug Sou, MD 05/16/12 1015  Doug Sou, MD 05/16/12 1015

## 2012-06-04 DIAGNOSIS — E876 Hypokalemia: Secondary | ICD-10-CM | POA: Diagnosis not present

## 2012-06-04 DIAGNOSIS — R609 Edema, unspecified: Secondary | ICD-10-CM | POA: Diagnosis not present

## 2012-06-04 DIAGNOSIS — T148XXA Other injury of unspecified body region, initial encounter: Secondary | ICD-10-CM | POA: Diagnosis not present

## 2012-06-09 DIAGNOSIS — E876 Hypokalemia: Secondary | ICD-10-CM | POA: Diagnosis not present

## 2012-06-09 DIAGNOSIS — F411 Generalized anxiety disorder: Secondary | ICD-10-CM | POA: Diagnosis not present

## 2012-06-09 DIAGNOSIS — Z79899 Other long term (current) drug therapy: Secondary | ICD-10-CM | POA: Diagnosis not present

## 2012-06-09 DIAGNOSIS — G5790 Unspecified mononeuropathy of unspecified lower limb: Secondary | ICD-10-CM | POA: Diagnosis not present

## 2012-06-09 DIAGNOSIS — M542 Cervicalgia: Secondary | ICD-10-CM | POA: Diagnosis not present

## 2012-06-09 DIAGNOSIS — G894 Chronic pain syndrome: Secondary | ICD-10-CM | POA: Diagnosis not present

## 2012-06-09 DIAGNOSIS — G541 Lumbosacral plexus disorders: Secondary | ICD-10-CM | POA: Diagnosis not present

## 2012-06-10 DIAGNOSIS — E876 Hypokalemia: Secondary | ICD-10-CM | POA: Diagnosis not present

## 2012-06-10 DIAGNOSIS — R609 Edema, unspecified: Secondary | ICD-10-CM | POA: Diagnosis not present

## 2012-06-10 DIAGNOSIS — R51 Headache: Secondary | ICD-10-CM | POA: Diagnosis not present

## 2012-06-23 DIAGNOSIS — M25569 Pain in unspecified knee: Secondary | ICD-10-CM | POA: Diagnosis not present

## 2012-07-06 DIAGNOSIS — G541 Lumbosacral plexus disorders: Secondary | ICD-10-CM | POA: Diagnosis not present

## 2012-07-06 DIAGNOSIS — G5790 Unspecified mononeuropathy of unspecified lower limb: Secondary | ICD-10-CM | POA: Diagnosis not present

## 2012-07-06 DIAGNOSIS — F411 Generalized anxiety disorder: Secondary | ICD-10-CM | POA: Diagnosis not present

## 2012-07-06 DIAGNOSIS — G894 Chronic pain syndrome: Secondary | ICD-10-CM | POA: Diagnosis not present

## 2012-07-26 DIAGNOSIS — R58 Hemorrhage, not elsewhere classified: Secondary | ICD-10-CM | POA: Diagnosis not present

## 2012-07-26 DIAGNOSIS — E876 Hypokalemia: Secondary | ICD-10-CM | POA: Diagnosis not present

## 2012-07-28 DIAGNOSIS — M25569 Pain in unspecified knee: Secondary | ICD-10-CM | POA: Diagnosis not present

## 2012-08-03 DIAGNOSIS — Z79899 Other long term (current) drug therapy: Secondary | ICD-10-CM | POA: Diagnosis not present

## 2012-08-03 DIAGNOSIS — G5790 Unspecified mononeuropathy of unspecified lower limb: Secondary | ICD-10-CM | POA: Diagnosis not present

## 2012-08-03 DIAGNOSIS — M542 Cervicalgia: Secondary | ICD-10-CM | POA: Diagnosis not present

## 2012-08-03 DIAGNOSIS — G541 Lumbosacral plexus disorders: Secondary | ICD-10-CM | POA: Diagnosis not present

## 2012-08-03 DIAGNOSIS — G894 Chronic pain syndrome: Secondary | ICD-10-CM | POA: Diagnosis not present

## 2012-08-03 DIAGNOSIS — F411 Generalized anxiety disorder: Secondary | ICD-10-CM | POA: Diagnosis not present

## 2012-08-04 DIAGNOSIS — M545 Low back pain: Secondary | ICD-10-CM | POA: Diagnosis not present

## 2012-08-17 DIAGNOSIS — E876 Hypokalemia: Secondary | ICD-10-CM | POA: Diagnosis not present

## 2012-08-24 DIAGNOSIS — F411 Generalized anxiety disorder: Secondary | ICD-10-CM | POA: Diagnosis not present

## 2012-08-24 DIAGNOSIS — G541 Lumbosacral plexus disorders: Secondary | ICD-10-CM | POA: Diagnosis not present

## 2012-08-24 DIAGNOSIS — G5790 Unspecified mononeuropathy of unspecified lower limb: Secondary | ICD-10-CM | POA: Diagnosis not present

## 2012-08-24 DIAGNOSIS — G894 Chronic pain syndrome: Secondary | ICD-10-CM | POA: Diagnosis not present

## 2012-09-02 DIAGNOSIS — M1710 Unilateral primary osteoarthritis, unspecified knee: Secondary | ICD-10-CM | POA: Diagnosis not present

## 2012-09-03 ENCOUNTER — Other Ambulatory Visit: Payer: Self-pay | Admitting: Family Medicine

## 2012-09-03 DIAGNOSIS — Z1231 Encounter for screening mammogram for malignant neoplasm of breast: Secondary | ICD-10-CM

## 2012-09-09 DIAGNOSIS — M1710 Unilateral primary osteoarthritis, unspecified knee: Secondary | ICD-10-CM | POA: Diagnosis not present

## 2012-09-14 DIAGNOSIS — M1710 Unilateral primary osteoarthritis, unspecified knee: Secondary | ICD-10-CM | POA: Diagnosis not present

## 2012-09-16 DIAGNOSIS — E785 Hyperlipidemia, unspecified: Secondary | ICD-10-CM | POA: Diagnosis not present

## 2012-09-16 DIAGNOSIS — R609 Edema, unspecified: Secondary | ICD-10-CM | POA: Diagnosis not present

## 2012-09-16 DIAGNOSIS — I10 Essential (primary) hypertension: Secondary | ICD-10-CM | POA: Diagnosis not present

## 2012-09-16 DIAGNOSIS — E876 Hypokalemia: Secondary | ICD-10-CM | POA: Diagnosis not present

## 2012-09-24 DIAGNOSIS — G894 Chronic pain syndrome: Secondary | ICD-10-CM | POA: Diagnosis not present

## 2012-09-24 DIAGNOSIS — G5790 Unspecified mononeuropathy of unspecified lower limb: Secondary | ICD-10-CM | POA: Diagnosis not present

## 2012-09-24 DIAGNOSIS — G541 Lumbosacral plexus disorders: Secondary | ICD-10-CM | POA: Diagnosis not present

## 2012-09-24 DIAGNOSIS — F411 Generalized anxiety disorder: Secondary | ICD-10-CM | POA: Diagnosis not present

## 2012-10-08 ENCOUNTER — Ambulatory Visit: Payer: Medicare Other

## 2012-10-22 DIAGNOSIS — Z79899 Other long term (current) drug therapy: Secondary | ICD-10-CM | POA: Diagnosis not present

## 2012-10-22 DIAGNOSIS — G5790 Unspecified mononeuropathy of unspecified lower limb: Secondary | ICD-10-CM | POA: Diagnosis not present

## 2012-10-22 DIAGNOSIS — M542 Cervicalgia: Secondary | ICD-10-CM | POA: Diagnosis not present

## 2012-10-22 DIAGNOSIS — F329 Major depressive disorder, single episode, unspecified: Secondary | ICD-10-CM | POA: Diagnosis not present

## 2012-10-22 DIAGNOSIS — G894 Chronic pain syndrome: Secondary | ICD-10-CM | POA: Diagnosis not present

## 2012-10-22 DIAGNOSIS — F411 Generalized anxiety disorder: Secondary | ICD-10-CM | POA: Diagnosis not present

## 2012-10-22 DIAGNOSIS — G541 Lumbosacral plexus disorders: Secondary | ICD-10-CM | POA: Diagnosis not present

## 2012-10-22 DIAGNOSIS — H81399 Other peripheral vertigo, unspecified ear: Secondary | ICD-10-CM | POA: Diagnosis not present

## 2012-10-22 DIAGNOSIS — H814 Vertigo of central origin: Secondary | ICD-10-CM | POA: Diagnosis not present

## 2012-10-22 DIAGNOSIS — H8190 Unspecified disorder of vestibular function, unspecified ear: Secondary | ICD-10-CM | POA: Diagnosis not present

## 2012-10-26 DIAGNOSIS — M1710 Unilateral primary osteoarthritis, unspecified knee: Secondary | ICD-10-CM | POA: Diagnosis not present

## 2012-10-29 ENCOUNTER — Ambulatory Visit: Payer: Medicare Other

## 2012-11-15 DIAGNOSIS — F329 Major depressive disorder, single episode, unspecified: Secondary | ICD-10-CM | POA: Diagnosis not present

## 2012-11-15 DIAGNOSIS — F411 Generalized anxiety disorder: Secondary | ICD-10-CM | POA: Diagnosis not present

## 2012-11-15 DIAGNOSIS — G541 Lumbosacral plexus disorders: Secondary | ICD-10-CM | POA: Diagnosis not present

## 2012-11-15 DIAGNOSIS — G894 Chronic pain syndrome: Secondary | ICD-10-CM | POA: Diagnosis not present

## 2012-11-25 ENCOUNTER — Ambulatory Visit: Payer: Medicare Other

## 2012-12-07 DIAGNOSIS — M1710 Unilateral primary osteoarthritis, unspecified knee: Secondary | ICD-10-CM | POA: Diagnosis not present

## 2012-12-08 DIAGNOSIS — M1710 Unilateral primary osteoarthritis, unspecified knee: Secondary | ICD-10-CM | POA: Diagnosis not present

## 2012-12-10 DIAGNOSIS — M1710 Unilateral primary osteoarthritis, unspecified knee: Secondary | ICD-10-CM | POA: Diagnosis not present

## 2012-12-15 ENCOUNTER — Ambulatory Visit
Admission: RE | Admit: 2012-12-15 | Discharge: 2012-12-15 | Disposition: A | Discharge status: Home / Self Care | Payer: Medicare Other | Source: Ambulatory Visit | Attending: Family Medicine | Admitting: Family Medicine

## 2012-12-15 DIAGNOSIS — Z1231 Encounter for screening mammogram for malignant neoplasm of breast: Secondary | ICD-10-CM | POA: Diagnosis not present

## 2012-12-20 DIAGNOSIS — G894 Chronic pain syndrome: Secondary | ICD-10-CM | POA: Diagnosis not present

## 2012-12-20 DIAGNOSIS — F329 Major depressive disorder, single episode, unspecified: Secondary | ICD-10-CM | POA: Diagnosis not present

## 2012-12-20 DIAGNOSIS — F411 Generalized anxiety disorder: Secondary | ICD-10-CM | POA: Diagnosis not present

## 2012-12-20 DIAGNOSIS — G541 Lumbosacral plexus disorders: Secondary | ICD-10-CM | POA: Diagnosis not present

## 2012-12-21 DIAGNOSIS — M1710 Unilateral primary osteoarthritis, unspecified knee: Secondary | ICD-10-CM | POA: Diagnosis not present

## 2012-12-24 ENCOUNTER — Emergency Department (HOSPITAL_COMMUNITY): Payer: Medicare Other

## 2012-12-24 ENCOUNTER — Encounter (HOSPITAL_COMMUNITY): Payer: Self-pay | Admitting: Emergency Medicine

## 2012-12-24 ENCOUNTER — Emergency Department (HOSPITAL_COMMUNITY)
Admission: EM | Admit: 2012-12-24 | Discharge: 2012-12-24 | Disposition: A | Discharge status: Home / Self Care | Payer: Medicare Other | Attending: Emergency Medicine | Admitting: Emergency Medicine

## 2012-12-24 DIAGNOSIS — R109 Unspecified abdominal pain: Secondary | ICD-10-CM | POA: Diagnosis not present

## 2012-12-24 DIAGNOSIS — I2510 Atherosclerotic heart disease of native coronary artery without angina pectoris: Secondary | ICD-10-CM | POA: Insufficient documentation

## 2012-12-24 DIAGNOSIS — G4730 Sleep apnea, unspecified: Secondary | ICD-10-CM | POA: Diagnosis not present

## 2012-12-24 DIAGNOSIS — E669 Obesity, unspecified: Secondary | ICD-10-CM | POA: Diagnosis not present

## 2012-12-24 DIAGNOSIS — G8929 Other chronic pain: Secondary | ICD-10-CM | POA: Insufficient documentation

## 2012-12-24 DIAGNOSIS — IMO0002 Reserved for concepts with insufficient information to code with codable children: Secondary | ICD-10-CM | POA: Insufficient documentation

## 2012-12-24 DIAGNOSIS — Z9089 Acquired absence of other organs: Secondary | ICD-10-CM | POA: Insufficient documentation

## 2012-12-24 DIAGNOSIS — Z86711 Personal history of pulmonary embolism: Secondary | ICD-10-CM | POA: Diagnosis not present

## 2012-12-24 DIAGNOSIS — Z79899 Other long term (current) drug therapy: Secondary | ICD-10-CM | POA: Insufficient documentation

## 2012-12-24 DIAGNOSIS — Z8679 Personal history of other diseases of the circulatory system: Secondary | ICD-10-CM | POA: Diagnosis not present

## 2012-12-24 DIAGNOSIS — J4489 Other specified chronic obstructive pulmonary disease: Secondary | ICD-10-CM | POA: Insufficient documentation

## 2012-12-24 DIAGNOSIS — R630 Anorexia: Secondary | ICD-10-CM | POA: Diagnosis not present

## 2012-12-24 DIAGNOSIS — R110 Nausea: Secondary | ICD-10-CM | POA: Diagnosis not present

## 2012-12-24 DIAGNOSIS — S3981XA Other specified injuries of abdomen, initial encounter: Secondary | ICD-10-CM | POA: Diagnosis not present

## 2012-12-24 DIAGNOSIS — E119 Type 2 diabetes mellitus without complications: Secondary | ICD-10-CM | POA: Diagnosis not present

## 2012-12-24 DIAGNOSIS — Z8739 Personal history of other diseases of the musculoskeletal system and connective tissue: Secondary | ICD-10-CM | POA: Diagnosis not present

## 2012-12-24 DIAGNOSIS — Z7982 Long term (current) use of aspirin: Secondary | ICD-10-CM | POA: Diagnosis not present

## 2012-12-24 DIAGNOSIS — F17200 Nicotine dependence, unspecified, uncomplicated: Secondary | ICD-10-CM | POA: Insufficient documentation

## 2012-12-24 DIAGNOSIS — Z8719 Personal history of other diseases of the digestive system: Secondary | ICD-10-CM | POA: Diagnosis not present

## 2012-12-24 DIAGNOSIS — J449 Chronic obstructive pulmonary disease, unspecified: Secondary | ICD-10-CM | POA: Insufficient documentation

## 2012-12-24 DIAGNOSIS — I10 Essential (primary) hypertension: Secondary | ICD-10-CM | POA: Insufficient documentation

## 2012-12-24 DIAGNOSIS — K219 Gastro-esophageal reflux disease without esophagitis: Secondary | ICD-10-CM | POA: Insufficient documentation

## 2012-12-24 DIAGNOSIS — Z90710 Acquired absence of both cervix and uterus: Secondary | ICD-10-CM | POA: Insufficient documentation

## 2012-12-24 HISTORY — DX: Type 2 diabetes mellitus without complications: E11.9

## 2012-12-24 LAB — CBC WITH DIFFERENTIAL/PLATELET
Basophils Absolute: 0 10*3/uL (ref 0.0–0.1)
HCT: 42.3 % (ref 36.0–46.0)
Lymphocytes Relative: 29 % (ref 12–46)
Monocytes Absolute: 0.7 10*3/uL (ref 0.1–1.0)
Neutro Abs: 8.2 10*3/uL — ABNORMAL HIGH (ref 1.7–7.7)
Neutrophils Relative %: 64 % (ref 43–77)
Platelets: 188 10*3/uL (ref 150–400)
RDW: 15.6 % — ABNORMAL HIGH (ref 11.5–15.5)
WBC: 12.8 10*3/uL — ABNORMAL HIGH (ref 4.0–10.5)

## 2012-12-24 LAB — COMPREHENSIVE METABOLIC PANEL
ALT: 18 U/L (ref 0–35)
AST: 27 U/L (ref 0–37)
Albumin: 3.2 g/dL — ABNORMAL LOW (ref 3.5–5.2)
Alkaline Phosphatase: 119 U/L — ABNORMAL HIGH (ref 39–117)
BUN: 10 mg/dL (ref 6–23)
CO2: 32 mEq/L (ref 19–32)
Calcium: 9.3 mg/dL (ref 8.4–10.5)
Chloride: 99 mEq/L (ref 96–112)
Creatinine, Ser: 0.63 mg/dL (ref 0.50–1.10)
GFR calc Af Amer: >90 mL/min (ref >90)
GFR calc non Af Amer: >90 mL/min (ref >90)
Glucose, Bld: 119 mg/dL — ABNORMAL HIGH (ref 70–99)
Potassium: 4 mEq/L (ref 3.5–5.1)
Sodium: 139 mEq/L (ref 135–145)
Total Bilirubin: 0.3 mg/dL (ref 0.3–1.2)
Total Protein: 7.6 g/dL (ref 6.0–8.3)

## 2012-12-24 LAB — URINALYSIS, ROUTINE W REFLEX MICROSCOPIC
Bilirubin Urine: NEGATIVE
Glucose, UA: NEGATIVE mg/dL
Hgb urine dipstick: NEGATIVE
Ketones, ur: NEGATIVE mg/dL
Leukocytes, UA: NEGATIVE
Nitrite: NEGATIVE
Protein, ur: NEGATIVE mg/dL
Specific Gravity, Urine: 1.01 (ref 1.005–1.030)
Urobilinogen, UA: 0.2 mg/dL (ref 0.0–1.0)
pH: 6 (ref 5.0–8.0)

## 2012-12-24 MED ORDER — HYDROMORPHONE HCL PF 1 MG/ML IJ SOLN
1.0000 mg | Freq: Once | INTRAMUSCULAR | Status: AC
  Administered 2012-12-24: 1 mg via INTRAVENOUS
  Filled 2012-12-24: qty 1

## 2012-12-24 MED ORDER — IOHEXOL 300 MG/ML  SOLN
100.0000 mL | Freq: Once | INTRAMUSCULAR | Status: AC | PRN
  Administered 2012-12-24: 100 mL via INTRAVENOUS

## 2012-12-24 MED ORDER — SODIUM CHLORIDE 0.9 % IV BOLUS (SEPSIS)
1000.0000 mL | Freq: Once | INTRAVENOUS | Status: AC
  Administered 2012-12-24: 1000 mL via INTRAVENOUS

## 2012-12-24 MED ORDER — ONDANSETRON HCL 4 MG/2ML IJ SOLN
4.0000 mg | Freq: Once | INTRAMUSCULAR | Status: AC
  Administered 2012-12-24: 4 mg via INTRAVENOUS
  Filled 2012-12-24: qty 2

## 2012-12-24 MED ORDER — IOHEXOL 300 MG/ML  SOLN: 100.0000 mL | Freq: Once | INTRAMUSCULAR | Status: DC | PRN

## 2012-12-24 MED ORDER — IOHEXOL 300 MG/ML  SOLN
50.0000 mL | Freq: Once | INTRAMUSCULAR | Status: AC | PRN
  Administered 2012-12-24: 50 mL via ORAL

## 2012-12-24 NOTE — ED Provider Notes (Signed)
History     CSN: [id number]  Arrival date & time 12/24/12  1008   First MD Initiated Contact with Patient 12/24/12 1137      Chief Complaint  Patient presents with  . Abdominal Pain    (Consider location/radiation/quality/duration/timing/severity/associated sxs/prior treatment) HPI Patient presents with worsening abdominal pain.  She typically has some abdominal pain, but over the past weeks the pain has become more present, left-sided, with diffuse radiation.  Over the past 2 days the pain has become severe, focally about the left lower quadrant.  She is there is increasing distention as well. There is concurrent anorexia, nausea.  Last bowel movement was earlier today. She notes that as her symptoms have progressed she has had no relief with taking her home medication, which includes narcotics. She has a notable history of prior hysterectomy, as well as partial colectomy. She denies concurrent chest pain, dyspnea, fever, chills, weakness or falling.  Past Medical History  Diagnosis Date  . Obesity   . Hypertension   . GERD (gastroesophageal reflux disease)   . Chronic pain   . Pulmonary embolism   . COPD (chronic obstructive pulmonary disease)   . Tobacco abuse   . CAD (coronary artery disease)   . Spondylolisthesis, grade 2   . Deviated septum   . Sleep apnea   . Perforated bowel   . Diabetes mellitus without complication     Past Surgical History  Procedure Laterality Date  . Back surgery    . Abdominal hysterectomy    . Colon surgery      No family history on file.  History  Substance Use Topics  . Smoking status: Current Every Day Smoker -- 1.00 packs/day  . Smokeless tobacco: Not on file  . Alcohol Use: No    OB History   Grav Para Term Preterm Abortions TAB SAB Ect Mult Living                  Review of Systems  Constitutional:       Per HPI, otherwise negative  HENT:       Per HPI, otherwise negative  Respiratory:       Per HPI, otherwise  negative  Cardiovascular:       Per HPI, otherwise negative  Gastrointestinal: Negative for vomiting.  Endocrine:       Negative aside from HPI  Genitourinary:       Neg aside from HPI   Musculoskeletal:       Per HPI, otherwise negative  Skin: Negative.   Neurological: Negative for syncope.    Allergies  Amoxicillin; Bactrim; Clindamycin/lincomycin; Doxycycline; Potassium chloride er; and Treximet  Home Medications   Current Outpatient Rx  Name  Route  Sig  Dispense  Refill  . albuterol (PROVENTIL HFA;VENTOLIN HFA) 108 (90 BASE) MCG/ACT inhaler   Inhalation   Inhale 2 puffs into the lungs every 4 (four) hours as needed. Shortness of breath/wheezing.         Marland Kitchen ALPRAZolam (XANAX) 1 MG tablet   Oral   Take 1-2 mg by mouth 2 (two) times daily. 1 tablet in the morning and 2 tablets at bedtime.         Marland Kitchen aspirin 81 MG chewable tablet   Oral   Chew 81 mg by mouth daily.         . clotrimazole-betamethasone (LOTRISONE) cream   Topical   Apply 1 application topically at bedtime and may repeat dose one time if needed. Apply to  hands         . cyclobenzaprine (FLEXERIL) 10 MG tablet   Oral   Take 10 mg by mouth 2 (two) times daily. Muscle spasm, scheduled dose         . escitalopram (LEXAPRO) 10 MG tablet   Oral   Take 10 mg by mouth daily.         Marland Kitchen esomeprazole (NEXIUM) 40 MG capsule   Oral   Take 40 mg by mouth 2 (two) times daily.          Marland Kitchen estradiol (ESTRACE) 2 MG tablet   Oral   Take 2 mg by mouth daily.           . fluticasone (FLONASE) 50 MCG/ACT nasal spray   Nasal   Place 2 sprays into the nose at bedtime.          . lidocaine (LIDODERM) 5 %   Transdermal   Place 1-3 patches onto the skin daily as needed. Remove & Discard patch within 12 hours or as directed by MD For pain         . lidocaine (XYLOCAINE) 5 % ointment   Topical   Apply 1 application topically daily. To hemmorroids         . meloxicam (MOBIC) 15 MG tablet    Oral   Take 15 mg by mouth daily as needed. Knee pain and inflammation         . metFORMIN (GLUCOPHAGE) 500 MG tablet   Oral   Take 500 mg by mouth 2 (two) times daily with a meal.         . metolazone (ZAROXOLYN) 10 MG tablet   Oral   Take 10 mg by mouth daily. May take a 2nd dose for excessive edema         . nystatin (NYSTOP) 100000 UNIT/GM POWD   Topical   Apply 1 g topically 3 (three) times daily as needed. To rashy areas         . olopatadine (PATANOL) 0.1 % ophthalmic solution   Both Eyes   Place 1 drop into both eyes 2 (two) times daily.         Marland Kitchen oxyCODONE (OXYCONTIN) 40 MG 12 hr tablet   Oral   Take 40 mg by mouth every 12 (twelve) hours. Pain, scheduled dose         . oxyCODONE-acetaminophen (PERCOCET) 10-325 MG per tablet   Oral   Take 1 tablet by mouth every 4 (four) hours. Pain, scheduled doses         . potassium chloride (K-DUR) 10 MEQ tablet   Oral   Take 30 mEq by mouth 2 (two) times daily.         . promethazine (PHENERGAN) 25 MG tablet   Oral   Take 25 mg by mouth every 6 (six) hours as needed. nausea         . Simethicone (PHAZYME) 180 MG CAPS   Oral   Take 1 capsule by mouth 4 (four) times daily as needed. For gas/bloating         . zolpidem (AMBIEN CR) 12.5 MG CR tablet   Oral   Take 12.5 mg by mouth at bedtime as needed. For sleep         . hydrocortisone (ANUSOL-HC) 2.5 % rectal cream   Rectal   Place 1 application rectally 2 (two) times daily as needed. For pain due to hemmorroids         .  polyethylene glycol (MIRALAX / GLYCOLAX) packet   Oral   Take 17 g by mouth daily as needed. For constipation           BP 114/63  Pulse 82  Temp(Src) 97.9 F (36.6 C) (Oral)  Resp 16  SpO2 100%  Physical Exam  Nursing note and vitals reviewed. Constitutional: She is oriented to person, place, and time. She appears well-developed and well-nourished. No distress.  HENT:  Head: Normocephalic and atraumatic.  Eyes:  Conjunctivae and EOM are normal.  Cardiovascular: Normal rate and regular rhythm.   Pulmonary/Chest: Effort normal and breath sounds normal. No stridor. No respiratory distress.  Abdominal: She exhibits no distension.  Surgical scars well healing and appearance the patient's abdomen is large, though not taught. Bowel sounds are present, though there is tenderness to palpation primarily about the left side with guarding across the left lateral abdomen  Musculoskeletal: She exhibits no edema.  Neurological: She is alert and oriented to person, place, and time. No cranial nerve deficit.  Skin: Skin is warm and dry.  Psychiatric: She has a normal mood and affect.    ED Course  Procedures (including critical care time)  Labs Reviewed  CBC WITH DIFFERENTIAL - Abnormal; Notable for the following:    WBC 12.8 (*)    RDW 15.6 (*)    Neutro Abs 8.2 (*)    All other components within normal limits  COMPREHENSIVE METABOLIC PANEL - Abnormal; Notable for the following:    Glucose, Bld 119 (*)    Albumin 3.2 (*)    Alkaline Phosphatase 119 (*)    All other components within normal limits  URINALYSIS, ROUTINE W REFLEX MICROSCOPIC   No results found.   No diagnosis found.  Patient specifically requests IV Dilaudid.  She states that intramuscular, and oral Dilaudid did not work for her.  Oxygen saturation 96% room air normal  Patient's labs and CT largely unremarkable.  The patient continues to request narcotics. MDM  This patient presents with worsening of pain that began several weeks ago in her abdomen.  On exam she is in no distress, with no fever, no hypoxia, tachycardia, tachypnea. Given the chronicity of the problem there is low suspicion for acute pathology, though with her risk factors she had CT scan, labs.  There is a mild leukocytosis but no focal area of infection, suggesting ongoing inflammatory or reactive process.  With no acute findings on CT, with referring vitals and labs  patient is appropriate for outpatient management.  The patient continued to request narcotics throughout her emergency department stay, she provided any on discharge the  Carmin Muskrat, MD 12/24/12 1511

## 2012-12-24 NOTE — ED Notes (Signed)
Onset 3 weeks ago fell since then general abdominal pain and worsening over time with distention. Last BM today normal. Taking pain medication for chronic lower back pain with no relief.

## 2013-01-17 DIAGNOSIS — F411 Generalized anxiety disorder: Secondary | ICD-10-CM | POA: Diagnosis not present

## 2013-01-17 DIAGNOSIS — G894 Chronic pain syndrome: Secondary | ICD-10-CM | POA: Diagnosis not present

## 2013-01-17 DIAGNOSIS — F329 Major depressive disorder, single episode, unspecified: Secondary | ICD-10-CM | POA: Diagnosis not present

## 2013-01-17 DIAGNOSIS — G541 Lumbosacral plexus disorders: Secondary | ICD-10-CM | POA: Diagnosis not present

## 2013-01-18 DIAGNOSIS — M1710 Unilateral primary osteoarthritis, unspecified knee: Secondary | ICD-10-CM | POA: Diagnosis not present

## 2013-01-24 DIAGNOSIS — I8310 Varicose veins of unspecified lower extremity with inflammation: Secondary | ICD-10-CM | POA: Diagnosis not present

## 2013-01-24 DIAGNOSIS — I10 Essential (primary) hypertension: Secondary | ICD-10-CM | POA: Diagnosis not present

## 2013-01-24 DIAGNOSIS — J449 Chronic obstructive pulmonary disease, unspecified: Secondary | ICD-10-CM | POA: Diagnosis not present

## 2013-01-24 DIAGNOSIS — E119 Type 2 diabetes mellitus without complications: Secondary | ICD-10-CM | POA: Diagnosis not present

## 2013-01-24 DIAGNOSIS — R609 Edema, unspecified: Secondary | ICD-10-CM | POA: Diagnosis not present

## 2013-01-24 DIAGNOSIS — M1990 Unspecified osteoarthritis, unspecified site: Secondary | ICD-10-CM | POA: Diagnosis not present

## 2013-02-01 DIAGNOSIS — M47817 Spondylosis without myelopathy or radiculopathy, lumbosacral region: Secondary | ICD-10-CM | POA: Diagnosis not present

## 2013-02-02 DIAGNOSIS — M25569 Pain in unspecified knee: Secondary | ICD-10-CM | POA: Diagnosis not present

## 2013-02-02 DIAGNOSIS — M1710 Unilateral primary osteoarthritis, unspecified knee: Secondary | ICD-10-CM | POA: Diagnosis not present

## 2013-02-16 DIAGNOSIS — R42 Dizziness and giddiness: Secondary | ICD-10-CM | POA: Diagnosis not present

## 2013-02-16 DIAGNOSIS — Z79899 Other long term (current) drug therapy: Secondary | ICD-10-CM | POA: Diagnosis not present

## 2013-02-16 DIAGNOSIS — H8190 Unspecified disorder of vestibular function, unspecified ear: Secondary | ICD-10-CM | POA: Diagnosis not present

## 2013-02-16 DIAGNOSIS — G541 Lumbosacral plexus disorders: Secondary | ICD-10-CM | POA: Diagnosis not present

## 2013-02-16 DIAGNOSIS — F329 Major depressive disorder, single episode, unspecified: Secondary | ICD-10-CM | POA: Diagnosis not present

## 2013-02-16 DIAGNOSIS — R269 Unspecified abnormalities of gait and mobility: Secondary | ICD-10-CM | POA: Diagnosis not present

## 2013-02-16 DIAGNOSIS — G894 Chronic pain syndrome: Secondary | ICD-10-CM | POA: Diagnosis not present

## 2013-02-16 DIAGNOSIS — H81399 Other peripheral vertigo, unspecified ear: Secondary | ICD-10-CM | POA: Diagnosis not present

## 2013-02-16 DIAGNOSIS — M542 Cervicalgia: Secondary | ICD-10-CM | POA: Diagnosis not present

## 2013-02-16 DIAGNOSIS — F411 Generalized anxiety disorder: Secondary | ICD-10-CM | POA: Diagnosis not present

## 2013-02-24 ENCOUNTER — Encounter (HOSPITAL_COMMUNITY): Payer: Self-pay | Admitting: Pharmacy Technician

## 2013-02-28 DIAGNOSIS — F329 Major depressive disorder, single episode, unspecified: Secondary | ICD-10-CM | POA: Diagnosis not present

## 2013-02-28 DIAGNOSIS — M546 Pain in thoracic spine: Secondary | ICD-10-CM | POA: Diagnosis not present

## 2013-02-28 DIAGNOSIS — M542 Cervicalgia: Secondary | ICD-10-CM | POA: Diagnosis not present

## 2013-02-28 DIAGNOSIS — G541 Lumbosacral plexus disorders: Secondary | ICD-10-CM | POA: Diagnosis not present

## 2013-02-28 DIAGNOSIS — G894 Chronic pain syndrome: Secondary | ICD-10-CM | POA: Diagnosis not present

## 2013-02-28 DIAGNOSIS — Z79899 Other long term (current) drug therapy: Secondary | ICD-10-CM | POA: Diagnosis not present

## 2013-02-28 DIAGNOSIS — F411 Generalized anxiety disorder: Secondary | ICD-10-CM | POA: Diagnosis not present

## 2013-03-01 ENCOUNTER — Encounter (HOSPITAL_COMMUNITY)
Admission: RE | Admit: 2013-03-01 | Discharge: 2013-03-01 | Disposition: A | Discharge status: Home / Self Care | Payer: Medicare Other | Source: Ambulatory Visit | Attending: Orthopedic Surgery | Admitting: Orthopedic Surgery

## 2013-03-01 ENCOUNTER — Encounter (HOSPITAL_COMMUNITY): Payer: Self-pay

## 2013-03-01 ENCOUNTER — Ambulatory Visit (HOSPITAL_COMMUNITY)
Admission: RE | Admit: 2013-03-01 | Discharge: 2013-03-01 | Disposition: A | Discharge status: Home / Self Care | Payer: Medicare Other | Source: Ambulatory Visit | Attending: Orthopedic Surgery | Admitting: Orthopedic Surgery

## 2013-03-01 DIAGNOSIS — R9431 Abnormal electrocardiogram [ECG] [EKG]: Secondary | ICD-10-CM | POA: Diagnosis not present

## 2013-03-01 DIAGNOSIS — Z01818 Encounter for other preprocedural examination: Secondary | ICD-10-CM | POA: Insufficient documentation

## 2013-03-01 DIAGNOSIS — J45909 Unspecified asthma, uncomplicated: Secondary | ICD-10-CM | POA: Insufficient documentation

## 2013-03-01 DIAGNOSIS — Z01810 Encounter for preprocedural cardiovascular examination: Secondary | ICD-10-CM | POA: Insufficient documentation

## 2013-03-01 DIAGNOSIS — J984 Other disorders of lung: Secondary | ICD-10-CM | POA: Diagnosis not present

## 2013-03-01 HISTORY — DX: Major depressive disorder, single episode, unspecified: F32.9

## 2013-03-01 HISTORY — DX: Depression, unspecified: F32.A

## 2013-03-01 HISTORY — DX: Shortness of breath: R06.02

## 2013-03-01 HISTORY — DX: Unspecified asthma, uncomplicated: J45.909

## 2013-03-01 HISTORY — DX: Unspecified osteoarthritis, unspecified site: M19.90

## 2013-03-01 HISTORY — DX: Pure hypercholesterolemia, unspecified: E78.00

## 2013-03-01 HISTORY — DX: Nausea with vomiting, unspecified: R11.2

## 2013-03-01 HISTORY — DX: Other specified postprocedural states: Z98.890

## 2013-03-01 HISTORY — DX: Headache: R51

## 2013-03-01 LAB — CBC
HCT: 41.3 % (ref 36.0–46.0)
RDW: 15.1 % (ref 11.5–15.5)
WBC: 9.8 10*3/uL (ref 4.0–10.5)

## 2013-03-01 LAB — APTT: aPTT: 31 seconds (ref 24–37)

## 2013-03-01 LAB — URINALYSIS, ROUTINE W REFLEX MICROSCOPIC
Bilirubin Urine: NEGATIVE
Glucose, UA: NEGATIVE mg/dL
Hgb urine dipstick: NEGATIVE
Ketones, ur: NEGATIVE mg/dL
Leukocytes, UA: NEGATIVE
Protein, ur: NEGATIVE mg/dL

## 2013-03-01 LAB — BASIC METABOLIC PANEL
Chloride: 93 mEq/L — ABNORMAL LOW (ref 96–112)
Creatinine, Ser: 0.55 mg/dL (ref 0.50–1.10)
GFR calc Af Amer: >90 mL/min (ref >90)
GFR calc non Af Amer: >90 mL/min (ref >90)
Potassium: 3.3 mEq/L — ABNORMAL LOW (ref 3.5–5.1)

## 2013-03-01 LAB — PROTIME-INR
INR: 1.05 (ref 0.00–1.49)
Prothrombin Time: 13.6 seconds (ref 11.6–15.2)

## 2013-03-01 LAB — ABO/RH: ABO/RH(D): B POS

## 2013-03-01 NOTE — Progress Notes (Signed)
During PSt visit- noted to be very drowsy and had a lot of trouble staying awake. Head drooping. Stated did not sleep well due to pain, only uses CPAP mask sometimes. I repeatedly aroused her- answers were appropriate.  Made her repeat back instructions.  Offered to call her husband to get her as I was concerned about her safety on the road. Refused, states daughter doesn't have a vehicle.  States has a family member on [address] she could go to her house to sleep- refused to let me call this lady.  Ambulated to lab draw room- offered coffee and wet washcloth and refused. Checked on her over in radiology and was wide awake and talking to someone in waiting room!!!! Offered to let her come to pst and sit in a chair for awhile and she refused

## 2013-03-01 NOTE — Progress Notes (Signed)
Clearance with note Dr Tiburcio Pea chart, LOV Dr Mayford Knife 7/13 with nucleur stress 8/12 with nucleur stress test report

## 2013-03-01 NOTE — Patient Instructions (Addendum)
20 [name]  03/01/2013   Your procedure is scheduled on:  03/07/13  MONDAY  Report to [facility] at    1230   PM.  Call this number if you have problems the morning of surgery: [phone]     BRING INHALERS WITH YOU TO HOSPITAL  Remember: DO NOT TAKE ANY BLOOD SUGAR MEDICATION MORNING OF SURGERY  Do not eat food  After Midnight. Sunday NIGHT-  MAY HAVE CLEAR LIQUIDS UNTIL 0900AM  Monday MORNING, THEN NOTHING BY MOUTH   Take these medicines the morning of surgery with A SIP OF WATER: Nexium, Alprazolam, Oxycontin,,,,, May use aLBUTEROL IF NEEDED BRING CPAP MASK AND TUBING WITH YOU TO HOSPITAL  .  Contacts, dentures or partial plates can not be worn to surgery  Leave suitcase in the car. After surgery it may be brought to your room.  For patients admitted to the hospital, checkout time is 11:00 AM day of  discharge.             SPECIAL INSTRUCTIONS- SEE [facility] PREPARING FOR SURGERY INSTRUCTION SHEET-     DO NOT WEAR JEWELRY, LOTIONS, POWDERS, OR PERFUMES.  WOMEN-- DO NOT SHAVE LEGS OR UNDERARMS FOR 12 HOURS BEFORE SHOWERS. MEN MAY SHAVE FACE.  Patients discharged the day of surgery will not be allowed to drive home. IF going home the day of surgery, you must have a driver and someone to stay with you for the first 24 hours  Name and phone number of your driver:  ADMISSION                                                                      Please read over the following fact sheets that you were given: MRSA Information, Incentive Spirometry Sheet, Blood Transfusion Sheet  Information                                                                                   Terrisa Curfman  PST [phone]  [phone]                 FAILURE TO FOLLOW THESE INSTRUCTIONS MAY RESULT IN  CANCELLATION   OF YOUR SURGERY                                                  Patient Signature _____________________________

## 2013-03-01 NOTE — Progress Notes (Signed)
LOV with sleep study report Dr Vickey Huger 03/01/13 on chart

## 2013-03-03 NOTE — H&P (Signed)
TOTAL KNEE ADMISSION H&P  Patient is being admitted for left  knee patellofemoral arthroplasty.  Subjective:  Chief Complaint:   Left knee patellofemoral OA / pain.  HPI: [name], 52 y.o. female, has a history of pain and functional disability in the left knee due to arthritis and has failed non-surgical conservative treatments for greater than 12 weeks to includeNSAID's and/or analgesics, corticosteriod injections, viscosupplementation injections, use of assistive devices and activity modification.  Onset of symptoms was abrupt, starting >10 years ago with gradually worsening course since that time. The patient noted no past surgery on the left knee(s).  Patient currently rates pain in the left knee(s) at 9 out of 10 with activity. Patient has night pain, worsening of pain with activity and weight bearing, pain that interferes with activities of daily living, pain with passive range of motion, crepitus and joint swelling.  Patient has evidence of periarticular osteophytes and joint space narrowing of the patellofemoral joint space by imaging studies. There is no active infection.  Risks, benefits and expectations were discussed with the patient. Patient understand the risks, benefits and expectations and wishes to proceed with surgery.   D/C Plans:   Home with HHPT  Post-op Meds:     Rx given for ASA, Flexeril, Iron, Colace and MiraLax  Tranexamic Acid:   Not to be given - CAD  Decadron:    Not to be given - DM  FYI:    CPAP order  Ok with Dilaudid   Xarelto for 2 weeks, Rx not given  Patient Active Problem List   Diagnosis Date Noted  . Spondylolisthesis, grade 2   . Deviated septum   . Sleep apnea   . Respiratory failure, acute-on-chronic 12/11/2010  . Chronic pain   . Tobacco abuse   . CAD (coronary artery disease)   . Hypertension   . COPD (chronic obstructive pulmonary disease)    Past Medical History  Diagnosis Date  . Obesity   . Hypertension   . GERD  (gastroesophageal reflux disease)   . Chronic pain   . Pulmonary embolism   . COPD (chronic obstructive pulmonary disease)   . Tobacco abuse   . CAD (coronary artery disease)   . Spondylolisthesis, grade 2   . Deviated septum   . Perforated bowel   . Diabetes mellitus without complication   . Hypercholesterolemia   . PONV (postoperative nausea and vomiting)   . Depression   . Shortness of breath   . Asthma   . Headache(784.0)     migraines  . Arthritis   . Sleep apnea     Past Surgical History  Procedure Laterality Date  . Abdominal hysterectomy    . Colon surgery    . Back surgery      lumbar    No prescriptions prior to admission   Allergies  Allergen Reactions  . Amoxicillin Nausea Only    6/14- states has taken Amoxicillin and no reaction  . Bactrim Hives  . Clindamycin/Lincomycin Nausea And Vomiting  . Doxycycline Nausea Only  . Treximet (Sumatriptan-Naproxen Sodium) Nausea Only    History  Substance Use Topics  . Smoking status: Current Every Day Smoker -- 1.00 packs/day    Types: Cigarettes  . Smokeless tobacco: Never Used  . Alcohol Use: No    No family history on file.   Review of Systems  Constitutional: Negative.  Negative for weight loss.  Eyes: Negative.   Respiratory: Positive for shortness of breath.   Cardiovascular: Negative.  Gastrointestinal: Positive for heartburn and abdominal pain.  Genitourinary: Positive for urgency and frequency.  Musculoskeletal: Positive for back pain and joint pain.  Skin: Positive for rash.  Neurological: Positive for headaches.  Endo/Heme/Allergies: Positive for environmental allergies.  Psychiatric/Behavioral: Negative.     Objective:  Physical Exam  Constitutional: She is oriented to person, place, and time. She appears well-developed and well-nourished.  HENT:  Head: Normocephalic and atraumatic.  Mouth/Throat: Oropharynx is clear and moist.  Eyes: Pupils are equal, round, and reactive to light.   Neck: Neck supple. No JVD present. No tracheal deviation present. No thyromegaly present.  Cardiovascular: Normal rate, regular rhythm, normal heart sounds and intact distal pulses.   Respiratory: Effort normal. No stridor. No respiratory distress.  GI: Soft. There is no tenderness. There is no guarding.  Musculoskeletal:       Left knee: She exhibits decreased range of motion, swelling and bony tenderness. She exhibits no effusion, no ecchymosis, no deformity and no erythema. Tenderness found. No medial joint line and no lateral joint line tenderness noted.  Lymphadenopathy:    She has no cervical adenopathy.  Neurological: She is alert and oriented to person, place, and time.  Skin: Skin is warm and dry.  Psychiatric: She has a normal mood and affect.     Imaging Review Plain radiographs demonstrate moderate degenerative joint disease of the left knee patellofemoral joint. The overall alignment is neutral. The bone quality appears to be good for age and reported activity level.  Assessment/Plan:  End stage arthritis, left knee patellofemoral joint   The patient history, physical examination, clinical judgment of the provider and imaging studies are consistent with end stage degenerative joint disease of the left knee(s) and patellofemoral arthroplasty is deemed medically necessary. The treatment options including medical management, injection therapy arthroscopy and arthroplasty were discussed at length. The risks and benefits of total knee arthroplasty were presented and reviewed. The risks due to aseptic loosening, infection, stiffness, patella tracking problems, thromboembolic complications and other imponderables were discussed. The patient acknowledged the explanation, agreed to proceed with the plan and consent was signed. Patient is being admitted for inpatient treatment for surgery, pain control, PT, OT, prophylactic antibiotics, VTE prophylaxis, progressive ambulation and ADL's and  discharge planning. The patient is planning to be discharged home with home health services.   Anastasio Auerbach Caterin Tabares   PAC  03/03/2013, 2:28 PM

## 2013-03-04 NOTE — Progress Notes (Signed)
Called and spoke with [name] that there was a change in her surgery time. Time of surgery is now 1240 and patient is to arrive 1010 AM on 03/07/13. She is instructed NPO after midnight. No clear liquids after midnight. She verbalizes understanding.

## 2013-03-07 ENCOUNTER — Inpatient Hospital Stay (HOSPITAL_COMMUNITY): Payer: Medicare Other | Admitting: Anesthesiology

## 2013-03-07 ENCOUNTER — Encounter (HOSPITAL_COMMUNITY): Payer: Self-pay | Admitting: *Deleted

## 2013-03-07 ENCOUNTER — Inpatient Hospital Stay (HOSPITAL_COMMUNITY)
Admission: RE | Admit: 2013-03-07 | Discharge: 2013-03-09 | DRG: 470 | Disposition: A | Discharge status: Home Health | Payer: Medicare Other | Source: Ambulatory Visit | Attending: Orthopedic Surgery | Admitting: Orthopedic Surgery

## 2013-03-07 ENCOUNTER — Encounter (HOSPITAL_COMMUNITY)
Admission: RE | Disposition: A | Discharge status: Home Health | Payer: Self-pay | Source: Ambulatory Visit | Attending: Orthopedic Surgery

## 2013-03-07 ENCOUNTER — Encounter (HOSPITAL_COMMUNITY): Payer: Self-pay | Admitting: Anesthesiology

## 2013-03-07 DIAGNOSIS — E119 Type 2 diabetes mellitus without complications: Secondary | ICD-10-CM | POA: Diagnosis present

## 2013-03-07 DIAGNOSIS — I2510 Atherosclerotic heart disease of native coronary artery without angina pectoris: Secondary | ICD-10-CM | POA: Diagnosis not present

## 2013-03-07 DIAGNOSIS — K219 Gastro-esophageal reflux disease without esophagitis: Secondary | ICD-10-CM | POA: Diagnosis present

## 2013-03-07 DIAGNOSIS — F17200 Nicotine dependence, unspecified, uncomplicated: Secondary | ICD-10-CM | POA: Diagnosis not present

## 2013-03-07 DIAGNOSIS — M25569 Pain in unspecified knee: Secondary | ICD-10-CM | POA: Diagnosis not present

## 2013-03-07 DIAGNOSIS — E7800 Pure hypercholesterolemia, unspecified: Secondary | ICD-10-CM | POA: Diagnosis present

## 2013-03-07 DIAGNOSIS — IMO0002 Reserved for concepts with insufficient information to code with codable children: Secondary | ICD-10-CM | POA: Diagnosis not present

## 2013-03-07 DIAGNOSIS — Z86711 Personal history of pulmonary embolism: Secondary | ICD-10-CM | POA: Diagnosis not present

## 2013-03-07 DIAGNOSIS — G8929 Other chronic pain: Secondary | ICD-10-CM | POA: Diagnosis not present

## 2013-03-07 DIAGNOSIS — M1710 Unilateral primary osteoarthritis, unspecified knee: Principal | ICD-10-CM | POA: Diagnosis present

## 2013-03-07 DIAGNOSIS — I10 Essential (primary) hypertension: Secondary | ICD-10-CM | POA: Diagnosis present

## 2013-03-07 DIAGNOSIS — Z6841 Body Mass Index (BMI) 40.0 and over, adult: Secondary | ICD-10-CM | POA: Diagnosis not present

## 2013-03-07 DIAGNOSIS — Z96652 Presence of left artificial knee joint: Secondary | ICD-10-CM

## 2013-03-07 DIAGNOSIS — S8990XA Unspecified injury of unspecified lower leg, initial encounter: Secondary | ICD-10-CM | POA: Diagnosis not present

## 2013-03-07 HISTORY — PX: PATELLA-FEMORAL ARTHROPLASTY: SHX5037

## 2013-03-07 HISTORY — DX: Presence of left artificial knee joint: Z96.652

## 2013-03-07 LAB — GLUCOSE, CAPILLARY
Glucose-Capillary: 110 mg/dL — ABNORMAL HIGH (ref 70–99)
Glucose-Capillary: 133 mg/dL — ABNORMAL HIGH (ref 70–99)
Glucose-Capillary: 144 mg/dL — ABNORMAL HIGH (ref 70–99)

## 2013-03-07 LAB — TYPE AND SCREEN: ABO/RH(D): B POS

## 2013-03-07 SURGERY — ARTHROPLASTY, PATELLOFEMORAL
Anesthesia: Spinal | Site: Knee | Laterality: Left | Wound class: Clean

## 2013-03-07 MED ORDER — ESTRADIOL 2 MG PO TABS
2.0000 mg | ORAL_TABLET | Freq: Every day | ORAL | Status: DC
  Administered 2013-03-08 – 2013-03-09 (×2): 2 mg via ORAL
  Filled 2013-03-07 (×2): qty 1

## 2013-03-07 MED ORDER — METOLAZONE 10 MG PO TABS
10.0000 mg | ORAL_TABLET | Freq: Every day | ORAL | Status: DC
  Administered 2013-03-09: 10 mg via ORAL
  Filled 2013-03-07 (×2): qty 1

## 2013-03-07 MED ORDER — PANTOPRAZOLE SODIUM 40 MG PO TBEC
80.0000 mg | DELAYED_RELEASE_TABLET | Freq: Every day | ORAL | Status: DC
  Administered 2013-03-08 – 2013-03-09 (×2): 80 mg via ORAL
  Filled 2013-03-07 (×2): qty 2

## 2013-03-07 MED ORDER — HYDROMORPHONE HCL PF 1 MG/ML IJ SOLN
0.5000 mg | INTRAMUSCULAR | Status: DC | PRN
  Administered 2013-03-07 (×2): 1 mg via INTRAVENOUS
  Administered 2013-03-07 – 2013-03-08 (×3): 2 mg via INTRAVENOUS
  Administered 2013-03-09: 1 mg via INTRAVENOUS
  Filled 2013-03-07 (×2): qty 2
  Filled 2013-03-07 (×3): qty 1
  Filled 2013-03-07: qty 2

## 2013-03-07 MED ORDER — DIPHENHYDRAMINE HCL 25 MG PO CAPS: 25.0000 mg | ORAL_CAPSULE | Freq: Four times a day (QID) | ORAL | Status: DC | PRN

## 2013-03-07 MED ORDER — BUPIVACAINE-EPINEPHRINE PF 0.25-1:200000 % IJ SOLN
INTRAMUSCULAR | Status: DC | PRN
  Administered 2013-03-07: 25 mL

## 2013-03-07 MED ORDER — HYDROCORTISONE 2.5 % RE CREA: 1.0000 "application " | TOPICAL_CREAM | Freq: Two times a day (BID) | RECTAL | Status: DC | PRN

## 2013-03-07 MED ORDER — LIDOCAINE 5 % EX PTCH
1.0000 | MEDICATED_PATCH | Freq: Every day | CUTANEOUS | Status: DC | PRN
  Filled 2013-03-07: qty 3

## 2013-03-07 MED ORDER — MIDAZOLAM HCL 5 MG/5ML IJ SOLN
INTRAMUSCULAR | Status: DC | PRN
  Administered 2013-03-07: 1 mg via INTRAVENOUS

## 2013-03-07 MED ORDER — BISACODYL 10 MG RE SUPP: 10.0000 mg | Freq: Every day | RECTAL | Status: DC | PRN

## 2013-03-07 MED ORDER — LIDOCAINE 5 % EX OINT
1.0000 "application " | TOPICAL_OINTMENT | Freq: Every day | CUTANEOUS | Status: DC
  Filled 2013-03-07: qty 35.44

## 2013-03-07 MED ORDER — ONDANSETRON HCL 4 MG PO TABS
4.0000 mg | ORAL_TABLET | Freq: Four times a day (QID) | ORAL | Status: DC | PRN
  Administered 2013-03-09: 4 mg via ORAL
  Filled 2013-03-07: qty 1

## 2013-03-07 MED ORDER — OXYCODONE HCL 5 MG PO TABS
10.0000 mg | ORAL_TABLET | ORAL | Status: DC
  Administered 2013-03-07 – 2013-03-08 (×3): 10 mg via ORAL
  Administered 2013-03-08: 15 mg via ORAL
  Administered 2013-03-08: 10 mg via ORAL
  Administered 2013-03-08: 5 mg via ORAL
  Administered 2013-03-09 (×2): 10 mg via ORAL
  Filled 2013-03-07: qty 15
  Filled 2013-03-07 (×7): qty 2
  Filled 2013-03-07: qty 1

## 2013-03-07 MED ORDER — DOCUSATE SODIUM 100 MG PO CAPS
100.0000 mg | ORAL_CAPSULE | Freq: Two times a day (BID) | ORAL | Status: DC
  Administered 2013-03-07 – 2013-03-09 (×4): 100 mg via ORAL

## 2013-03-07 MED ORDER — BUPIVACAINE-EPINEPHRINE PF 0.25-1:200000 % IJ SOLN
INTRAMUSCULAR | Status: AC
  Filled 2013-03-07: qty 30

## 2013-03-07 MED ORDER — SODIUM CHLORIDE 0.9 % IJ SOLN
INTRAMUSCULAR | Status: DC | PRN
  Administered 2013-03-07: 15:00:00

## 2013-03-07 MED ORDER — POTASSIUM CHLORIDE ER 10 MEQ PO TBCR
30.0000 meq | EXTENDED_RELEASE_TABLET | Freq: Two times a day (BID) | ORAL | Status: DC
  Administered 2013-03-08 – 2013-03-09 (×3): 30 meq via ORAL
  Filled 2013-03-07 (×5): qty 3

## 2013-03-07 MED ORDER — CEFAZOLIN SODIUM-DEXTROSE 2-3 GM-% IV SOLR
2.0000 g | INTRAVENOUS | Status: AC
  Administered 2013-03-07: 2 g via INTRAVENOUS

## 2013-03-07 MED ORDER — RIVAROXABAN 10 MG PO TABS
10.0000 mg | ORAL_TABLET | ORAL | Status: DC
  Administered 2013-03-08 – 2013-03-09 (×2): 10 mg via ORAL
  Filled 2013-03-07 (×4): qty 1

## 2013-03-07 MED ORDER — FLEET ENEMA 7-19 GM/118ML RE ENEM: 1.0000 | ENEMA | Freq: Once | RECTAL | Status: AC | PRN

## 2013-03-07 MED ORDER — NYSTATIN 100000 UNIT/GM EX POWD
1.0000 g | Freq: Three times a day (TID) | CUTANEOUS | Status: DC | PRN
  Filled 2013-03-07: qty 15

## 2013-03-07 MED ORDER — HYDROMORPHONE HCL PF 1 MG/ML IJ SOLN
INTRAMUSCULAR | Status: AC
  Filled 2013-03-07: qty 1

## 2013-03-07 MED ORDER — OLOPATADINE HCL 0.1 % OP SOLN
1.0000 [drp] | Freq: Two times a day (BID) | OPHTHALMIC | Status: DC
  Administered 2013-03-07 – 2013-03-09 (×3): 1 [drp] via OPHTHALMIC
  Filled 2013-03-07: qty 5

## 2013-03-07 MED ORDER — ALPRAZOLAM 1 MG PO TABS
2.0000 mg | ORAL_TABLET | Freq: Every day | ORAL | Status: DC
  Administered 2013-03-07 – 2013-03-08 (×2): 2 mg via ORAL
  Filled 2013-03-07 (×2): qty 2

## 2013-03-07 MED ORDER — LACTATED RINGERS IV SOLN: INTRAVENOUS | Status: DC

## 2013-03-07 MED ORDER — LACTATED RINGERS IV SOLN
INTRAVENOUS | Status: DC
  Administered 2013-03-07 (×3): via INTRAVENOUS

## 2013-03-07 MED ORDER — SIMETHICONE 80 MG PO CHEW
80.0000 mg | CHEWABLE_TABLET | Freq: Four times a day (QID) | ORAL | Status: DC | PRN
  Filled 2013-03-07: qty 1

## 2013-03-07 MED ORDER — ESCITALOPRAM OXALATE 10 MG PO TABS
10.0000 mg | ORAL_TABLET | Freq: Every day | ORAL | Status: DC
  Administered 2013-03-07 – 2013-03-08 (×2): 10 mg via ORAL
  Filled 2013-03-07 (×3): qty 1

## 2013-03-07 MED ORDER — FLUTICASONE PROPIONATE 50 MCG/ACT NA SUSP
2.0000 | Freq: Every day | NASAL | Status: DC
  Administered 2013-03-07 – 2013-03-08 (×2): 2 via NASAL
  Filled 2013-03-07: qty 16

## 2013-03-07 MED ORDER — KETOROLAC TROMETHAMINE 30 MG/ML IJ SOLN
INTRAMUSCULAR | Status: DC | PRN
  Administered 2013-03-07: 30 mg via INTRAMUSCULAR

## 2013-03-07 MED ORDER — INSULIN ASPART 100 UNIT/ML ~~LOC~~ SOLN
0.0000 [IU] | Freq: Three times a day (TID) | SUBCUTANEOUS | Status: DC
  Administered 2013-03-07: 2 [IU] via SUBCUTANEOUS
  Administered 2013-03-08: 3 [IU] via SUBCUTANEOUS
  Administered 2013-03-08: 2 [IU] via SUBCUTANEOUS
  Administered 2013-03-09: 3 [IU] via SUBCUTANEOUS
  Administered 2013-03-09: 2 [IU] via SUBCUTANEOUS

## 2013-03-07 MED ORDER — ALBUTEROL SULFATE HFA 108 (90 BASE) MCG/ACT IN AERS: 2.0000 | INHALATION_SPRAY | RESPIRATORY_TRACT | Status: DC | PRN

## 2013-03-07 MED ORDER — ALPRAZOLAM 1 MG PO TABS
1.0000 mg | ORAL_TABLET | Freq: Every day | ORAL | Status: DC
Start: 2013-03-08 — End: 2013-03-09
  Administered 2013-03-08: 1 mg via ORAL
  Filled 2013-03-07: qty 1

## 2013-03-07 MED ORDER — METHOCARBAMOL 500 MG PO TABS
500.0000 mg | ORAL_TABLET | Freq: Four times a day (QID) | ORAL | Status: DC | PRN
  Administered 2013-03-08 – 2013-03-09 (×2): 500 mg via ORAL
  Filled 2013-03-07 (×2): qty 1

## 2013-03-07 MED ORDER — ONDANSETRON HCL 4 MG/2ML IJ SOLN: 4.0000 mg | Freq: Once | INTRAMUSCULAR | Status: DC | PRN

## 2013-03-07 MED ORDER — METFORMIN HCL 500 MG PO TABS
500.0000 mg | ORAL_TABLET | Freq: Two times a day (BID) | ORAL | Status: DC
  Administered 2013-03-08 – 2013-03-09 (×2): 500 mg via ORAL
  Filled 2013-03-07 (×5): qty 1

## 2013-03-07 MED ORDER — FENTANYL CITRATE 0.05 MG/ML IJ SOLN
INTRAMUSCULAR | Status: AC
  Filled 2013-03-07: qty 2

## 2013-03-07 MED ORDER — POLYETHYLENE GLYCOL 3350 17 G PO PACK
17.0000 g | PACK | Freq: Two times a day (BID) | ORAL | Status: DC
  Administered 2013-03-07 – 2013-03-08 (×3): 17 g via ORAL

## 2013-03-07 MED ORDER — ONDANSETRON HCL 4 MG/2ML IJ SOLN
INTRAMUSCULAR | Status: DC | PRN
  Administered 2013-03-07 (×2): 2 mg via INTRAVENOUS

## 2013-03-07 MED ORDER — METOCLOPRAMIDE HCL 10 MG PO TABS
5.0000 mg | ORAL_TABLET | Freq: Three times a day (TID) | ORAL | Status: DC | PRN
  Administered 2013-03-08: 10 mg via ORAL
  Filled 2013-03-07: qty 1

## 2013-03-07 MED ORDER — SODIUM CHLORIDE 0.9 % IV SOLN
INTRAVENOUS | Status: DC
  Administered 2013-03-07: 18:00:00 via INTRAVENOUS
  Filled 2013-03-07 (×7): qty 1000

## 2013-03-07 MED ORDER — SIMVASTATIN 20 MG PO TABS
20.0000 mg | ORAL_TABLET | Freq: Every day | ORAL | Status: DC
  Administered 2013-03-07 – 2013-03-08 (×2): 20 mg via ORAL
  Filled 2013-03-07 (×3): qty 1

## 2013-03-07 MED ORDER — KETOROLAC TROMETHAMINE 30 MG/ML IJ SOLN
INTRAMUSCULAR | Status: AC
  Filled 2013-03-07: qty 1

## 2013-03-07 MED ORDER — PROPOFOL INFUSION 10 MG/ML OPTIME
INTRAVENOUS | Status: DC | PRN
  Administered 2013-03-07 (×2): 120 ug/kg/min via INTRAVENOUS

## 2013-03-07 MED ORDER — HYDROMORPHONE HCL PF 1 MG/ML IJ SOLN
0.2500 mg | INTRAMUSCULAR | Status: DC | PRN
  Administered 2013-03-07 (×2): 0.25 mg via INTRAVENOUS
  Administered 2013-03-07: 0.5 mg via INTRAVENOUS

## 2013-03-07 MED ORDER — BUMETANIDE 1 MG PO TABS
1.0000 mg | ORAL_TABLET | Freq: Every day | ORAL | Status: DC
  Administered 2013-03-09: 1 mg via ORAL
  Filled 2013-03-07 (×2): qty 1

## 2013-03-07 MED ORDER — ONDANSETRON HCL 4 MG/2ML IJ SOLN
4.0000 mg | Freq: Four times a day (QID) | INTRAMUSCULAR | Status: DC | PRN
  Administered 2013-03-08: 4 mg via INTRAVENOUS
  Filled 2013-03-07: qty 2

## 2013-03-07 MED ORDER — METHOCARBAMOL 100 MG/ML IJ SOLN
500.0000 mg | Freq: Four times a day (QID) | INTRAVENOUS | Status: DC | PRN
  Administered 2013-03-07: 500 mg via INTRAVENOUS
  Filled 2013-03-07 (×2): qty 5

## 2013-03-07 MED ORDER — CEFAZOLIN SODIUM-DEXTROSE 2-3 GM-% IV SOLR
2.0000 g | Freq: Four times a day (QID) | INTRAVENOUS | Status: AC
  Administered 2013-03-07 – 2013-03-08 (×2): 2 g via INTRAVENOUS
  Filled 2013-03-07 (×2): qty 50

## 2013-03-07 MED ORDER — METOLAZONE 10 MG PO TABS
10.0000 mg | ORAL_TABLET | Freq: Every day | ORAL | Status: DC | PRN
  Filled 2013-03-07: qty 1

## 2013-03-07 MED ORDER — FENTANYL CITRATE 0.05 MG/ML IJ SOLN
100.0000 ug | Freq: Once | INTRAMUSCULAR | Status: AC
  Administered 2013-03-07: 100 ug via INTRAVENOUS

## 2013-03-07 MED ORDER — BUPIVACAINE IN DEXTROSE 0.75-8.25 % IT SOLN
INTRATHECAL | Status: DC | PRN
  Administered 2013-03-07: 1.8 mL via INTRATHECAL

## 2013-03-07 MED ORDER — OXYCODONE HCL ER 20 MG PO T12A
20.0000 mg | EXTENDED_RELEASE_TABLET | Freq: Two times a day (BID) | ORAL | Status: DC
  Administered 2013-03-07 – 2013-03-09 (×4): 20 mg via ORAL
  Filled 2013-03-07 (×8): qty 1

## 2013-03-07 MED ORDER — ZOLPIDEM TARTRATE 5 MG PO TABS: 5.0000 mg | ORAL_TABLET | Freq: Every evening | ORAL | Status: DC | PRN

## 2013-03-07 MED ORDER — METOCLOPRAMIDE HCL 5 MG/ML IJ SOLN
5.0000 mg | Freq: Three times a day (TID) | INTRAMUSCULAR | Status: DC | PRN
  Administered 2013-03-08: 10 mg via INTRAVENOUS
  Filled 2013-03-07: qty 2

## 2013-03-07 MED ORDER — BUPIVACAINE LIPOSOME 1.3 % IJ SUSP
20.0000 mL | Freq: Once | INTRAMUSCULAR | Status: DC
  Filled 2013-03-07: qty 20

## 2013-03-07 MED ORDER — FERROUS SULFATE 325 (65 FE) MG PO TABS
325.0000 mg | ORAL_TABLET | Freq: Three times a day (TID) | ORAL | Status: DC
  Administered 2013-03-08 – 2013-03-09 (×2): 325 mg via ORAL
  Filled 2013-03-07 (×7): qty 1

## 2013-03-07 MED ORDER — NITROGLYCERIN 0.4 MG SL SUBL: 0.4000 mg | SUBLINGUAL_TABLET | SUBLINGUAL | Status: DC | PRN

## 2013-03-07 SURGICAL SUPPLY — 57 items
BAG ZIPLOCK 12X15 (MISCELLANEOUS) ×2 IMPLANT
BANDAGE ELASTIC 6 VELCRO ST LF (GAUZE/BANDAGES/DRESSINGS) ×2 IMPLANT
BANDAGE ESMARK 6X9 LF (GAUZE/BANDAGES/DRESSINGS) ×1 IMPLANT
BLADE SAW SGTL 13.0X1.19X90.0M (BLADE) ×2 IMPLANT
BNDG ESMARK 6X9 LF (GAUZE/BANDAGES/DRESSINGS) ×2
BONE CEMENT GENTAMICIN (Cement) ×2 IMPLANT
BOWL SMART MIX CTS (DISPOSABLE) ×2 IMPLANT
CEMENT BONE GENTAMICIN 40 (Cement) ×1 IMPLANT
CLOTH BEACON ORANGE TIMEOUT ST (SAFETY) ×2 IMPLANT
CUFF TOURN SGL QUICK 34 (TOURNIQUET CUFF) ×1
CUFF TRNQT CYL 34X4X40X1 (TOURNIQUET CUFF) ×1 IMPLANT
DECANTER SPIKE VIAL GLASS SM (MISCELLANEOUS) ×2 IMPLANT
DERMABOND ADVANCED (GAUZE/BANDAGES/DRESSINGS) ×1
DERMABOND ADVANCED .7 DNX12 (GAUZE/BANDAGES/DRESSINGS) ×1 IMPLANT
DRAPE EXTREMITY T 121X128X90 (DRAPE) ×2 IMPLANT
DRAPE POUCH INSTRU U-SHP 10X18 (DRAPES) ×2 IMPLANT
DRAPE U-SHAPE 47X51 STRL (DRAPES) ×2 IMPLANT
DRSG AQUACEL AG ADV 3.5X10 (GAUZE/BANDAGES/DRESSINGS) ×2 IMPLANT
DRSG TEGADERM 4X4.75 (GAUZE/BANDAGES/DRESSINGS) ×2 IMPLANT
DURAPREP 26ML APPLICATOR (WOUND CARE) ×4 IMPLANT
ELECT REM PT RETURN 9FT ADLT (ELECTROSURGICAL) ×2
ELECTRODE REM PT RTRN 9FT ADLT (ELECTROSURGICAL) ×1 IMPLANT
EVACUATOR 1/8 PVC DRAIN (DRAIN) ×2 IMPLANT
FACESHIELD LNG OPTICON STERILE (SAFETY) ×10 IMPLANT
GAUZE SPONGE 2X2 8PLY STRL LF (GAUZE/BANDAGES/DRESSINGS) ×1 IMPLANT
GLOVE BIOGEL PI IND STRL 7.5 (GLOVE) ×3 IMPLANT
GLOVE BIOGEL PI IND STRL 8 (GLOVE) ×1 IMPLANT
GLOVE BIOGEL PI INDICATOR 7.5 (GLOVE) ×3
GLOVE BIOGEL PI INDICATOR 8 (GLOVE) ×1
GLOVE ECLIPSE 8.0 STRL XLNG CF (GLOVE) ×2 IMPLANT
GLOVE ORTHO TXT STRL SZ7.5 (GLOVE) ×4 IMPLANT
GOWN BRE IMP PREV XXLGXLNG (GOWN DISPOSABLE) ×6 IMPLANT
GOWN STRL NON-REIN LRG LVL3 (GOWN DISPOSABLE) ×2 IMPLANT
HANDPIECE INTERPULSE COAX TIP (DISPOSABLE) ×1
KIT BASIN OR (CUSTOM PROCEDURE TRAY) ×2 IMPLANT
MANIFOLD NEPTUNE II (INSTRUMENTS) ×2 IMPLANT
NDL SAFETY ECLIPSE 18X1.5 (NEEDLE) ×1 IMPLANT
NEEDLE HYPO 18GX1.5 SHARP (NEEDLE) ×1
NS IRRIG 1000ML POUR BTL (IV SOLUTION) ×2 IMPLANT
PACK TOTAL JOINT (CUSTOM PROCEDURE TRAY) ×2 IMPLANT
PATELLA STD 34X8.5 (Orthopedic Implant) ×2 IMPLANT
POSITIONER SURGICAL ARM (MISCELLANEOUS) ×2 IMPLANT
SET HNDPC FAN SPRY TIP SCT (DISPOSABLE) ×1 IMPLANT
SET PAD KNEE POSITIONER (MISCELLANEOUS) ×2 IMPLANT
SPONGE GAUZE 2X2 STER 10/PKG (GAUZE/BANDAGES/DRESSINGS) ×1
SUCTION FRAZIER 12FR DISP (SUCTIONS) ×2 IMPLANT
SUT MNCRL AB 4-0 PS2 18 (SUTURE) ×2 IMPLANT
SUT VIC AB 1 CT1 36 (SUTURE) ×2 IMPLANT
SUT VIC AB 2-0 CT1 27 (SUTURE) ×3
SUT VIC AB 2-0 CT1 TAPERPNT 27 (SUTURE) ×3 IMPLANT
SUT VLOC 180 0 24IN GS25 (SUTURE) ×2 IMPLANT
SYR 50ML LL SCALE MARK (SYRINGE) ×2 IMPLANT
TOWEL OR 17X26 10 PK STRL BLUE (TOWEL DISPOSABLE) ×4 IMPLANT
TRAY FOLEY CATH 14FRSI W/METER (CATHETERS) ×2 IMPLANT
VANGURAD PFR SML LEFT ×2 IMPLANT
WATER STERILE IRR 1500ML POUR (IV SOLUTION) ×4 IMPLANT
WRAP KNEE MAXI GEL POST OP (GAUZE/BANDAGES/DRESSINGS) ×2 IMPLANT

## 2013-03-07 NOTE — Interval H&P Note (Signed)
History and Physical Interval Note:  03/07/2013 12:29 PM  [name]  has presented today for surgery, with the diagnosis of left knee patellar femoral osteoarthritis  The various methods of treatment have been discussed with the patient and family. After consideration of risks, benefits and other options for treatment, the patient has consented to  Procedure(s): LEFT PATELLA-FEMORAL ARTHROPLASTY (Left) as a surgical intervention .  The patient's history has been reviewed, patient examined, no change in status, stable for surgery.  I have reviewed the patient's chart and labs.  Questions were answered to the patient's satisfaction.     Mauri Pole

## 2013-03-07 NOTE — Preoperative (Signed)
Beta Blockers   Reason not to administer Beta Blockers:Not Applicable 

## 2013-03-07 NOTE — Anesthesia Postprocedure Evaluation (Signed)
  Anesthesia Post-op Note  Patient: [name]  Procedure(s) Performed: Procedure(s) (LRB): LEFT PATELLA-FEMORAL ARTHROPLASTY (Left)  Patient Location: PACU  Anesthesia Type: Spinal  Level of Consciousness: awake and alert   Airway and Oxygen Therapy: Patient Spontanous Breathing  Post-op Pain: mild  Post-op Assessment: Post-op Vital signs reviewed, Patient's Cardiovascular Status Stable, Respiratory Function Stable, Patent Airway and No signs of Nausea or vomiting  Last Vitals:  Filed Vitals:   03/07/13 1730  BP: 98/64  Pulse: 83  Temp: 36.4 C  Resp: 16    Post-op Vital Signs: stable   Complications: No apparent anesthesia complications

## 2013-03-07 NOTE — Brief Op Note (Signed)
886/23/2014  3:01 PM  PATIENT:  [name]  52 y.o. female  PRE-OPERATIVE DIAGNOSIS:  left knee patellofemoral osteoarthritis  POST-OPERATIVE DIAGNOSIS:  Left knee patellofemoral osteoarthritis  PROCEDURE:  Procedure(s): LEFT PATELLA-FEMORAL ARTHROPLASTY (Left)  BIOMET PFR, small femoral shield, 34 standard patella button  SURGEON:  Surgeon(s) and Role:    * Mauri Pole, MD - Primary  PHYSICIAN ASSISTANT: Danae Orleans, PA-C  ANESTHESIA:   spinal  EBL:  Total I/O In: 1000 [I.V.:1000] Out: 275 [Urine:125; Blood:150]  BLOOD ADMINISTERED:none  DRAINS: (1 medium) Hemovact drain(s) in the left knee with  Suction Open   LOCAL MEDICATIONS USED:  OTHER exparel combination  SPECIMEN:  No Specimen  DISPOSITION OF SPECIMEN:  N/A  COUNTS:  YES  TOURNIQUET:   Total Tourniquet Time Documented: Thigh (Left) - 33 minutes Total: Thigh (Left) - 33 minutes   DICTATION: .Other Dictation: Dictation Number [id number]  PLAN OF CARE: Admit to inpatient   PATIENT DISPOSITION:  PACU - hemodynamically stable.   Delay start of Pharmacological VTE agent (>24hrs) due to surgical blood loss or risk of bleeding: no

## 2013-03-07 NOTE — Anesthesia Procedure Notes (Signed)
Spinal  Patient location during procedure: OR Staffing Anesthesiologist: Trayton Szabo Performed by: anesthesiologist  Preanesthetic Checklist Completed: patient identified, site marked, surgical consent, pre-op evaluation, timeout performed, IV checked, risks and benefits discussed and monitors and equipment checked Spinal Block Patient position: sitting Prep: Betadine Patient monitoring: heart rate, continuous pulse ox and blood pressure Approach: right paramedian Location: L3-4 Injection technique: single-shot Needle Needle type: Spinocan  Needle gauge: 22 G Needle length: 9 cm Additional Notes Expiration date of kit checked and confirmed. Patient tolerated procedure well, without complications.     

## 2013-03-07 NOTE — Op Note (Signed)
[name], [name]                  ACCOUNT NO.:  [id number]  MEDICAL RECORD NO.:  [id number]  LOCATION:  [facility]                         FACILITY:  [facility]  PHYSICIAN:  Pietro Cassis. Alvan Dame, M.D.  DATE OF BIRTH:  1960/12/20  DATE OF PROCEDURE:  03/07/2013 DATE OF DISCHARGE:                              OPERATIVE REPORT   PREOPERATIVE DIAGNOSIS:  Left knee advanced patellofemoral arthritis.  POSTOPERATIVE DIAGNOSIS:  Left knee advanced patellofemoral arthritis.  PROCEDURE:  Left knee patellofemoral replacement utilizing a Biomet PFR system size small left femoral component and a size 34 standard patella.  SURGEON:  Pietro Cassis. Alvan Dame, M.D.  ASSISTANT:  Danae Orleans, PA-C.  Note that Mr. Guinevere Scarlet was present for the entirety of the case from preoperative position, perioperative management of the operative extremity, general facilitation of the case, primary wound closure.  ANESTHESIA:  Spinal.  SPECIMENS:  None.  COMPLICATIONS:  None.  DRAINS:  One medium Hemovac.  TOURNIQUET TIME:  32 minutes at 250 mmHg.  INDICATIONS FOR PROCEDURE:  [name] is a 52 year old female, who presented for evaluation of left knee pain.  Radiographic and presurgical arthroscopic evaluation revealed advanced patellofemoral arthritis failing conservative measures.  She, at this point, is ready to proceed with more definitive measures.  [age] and her multiple medical issues, I felt she was an ideal candidate for a partial knee replacement.  Focused on the patellofemoral joint due to the easier recover ability, less postop discomfort and higher chance of successful rehab.  Risks of failed partial patellofemoral arthroplasty as well as risks of infection, DVT were discussed.  Consent was obtained for benefit of pain relief.  PROCEDURE IN DETAIL:  The patient was brought to operative theater. Once adequate anesthesia, preoperative antibiotics, Ancef administered. She was positioned supine with a left  thigh tourniquet placed.  Left lower extremity was then prepped and draped in a sterile fashion.  Time- out was performed identifying the patient, planned procedure, and extremity.  Left foot was placed in the Mayo leg holder.  Once the leg was exsanguinated, tourniquet elevated to 250 mmHg.  A midline incision was made followed by median arthrotomy sharply to prevent damage to the femoral and tibial articulating cartilage.  Menisci were not affected by this.  Once exposure was obtained, first attention was directed to the patella. She was noted a very worn and significant arthritic patella with a significant trochlear surface as well.  I resected very thin amount of patella resecting down to about 11 mm of bone as she only had about 14 mm to begin with.  I chose to use a 34 standard patellar button.  Lug holes were drilled for this and the trial button placed.  At this point, attention was directed to femoral preparation.  The femoral canal was opened with a drill, irrigated to try to prevent fat emboli.  The intramedullary device was then placed and rotation for the femoral component set based off the Whiteside's line and epicondylar axis.  The cutting guide was then positioned off the anterior trochlea and anterior femoral cut made.  At this point, I selected a size small femoral component to best fit this anterior flange as  well as over the trochlea.  I then used a rasp and I was able to rasp out the bone in the trochlear area to allow the femoral component to fit flush and several trials of this, I got it to sit flush on the anterior surface as well as on the trochlea.  The patella was noted to track through the trochlea without application of any pressure.  Given these findings, the final components were opened.  We injected the synovial capsule junction of the knee with Exparel, Marcaine, and Toradol.  The knee was irrigated with normal saline solution.  Cement was mixed and the  final components were then cemented into place following final femoral preparation.  Once the cement fully cured, excessive cement was removed throughout the knee.  We irrigated the knee out again.  I placed a medium Hemovac drain.  The tourniquet had been let down at 33 minutes without significant hemostasis.  The extensor mechanism was then reapproximated using #1 Vicryl and 0 V-Loc suture.  The remaining wound was closed with 2-0 Vicryl and running 4-0 Monocryl.  The knee was cleaned, dried, and dressed sterilely using Dermabond and Aquacel dressing.  Drain site was dressed separately.  She was then brought to the recovery room in stable condition tolerating the procedure well.  Anticipate discharge home tomorrow with home health physical therapy followed by outpatient therapy to maximize strength for extensor mechanism and range of motion.     Pietro Cassis Alvan Dame, M.D.     MDO/MEDQ  D:  03/07/2013  T:  03/07/2013  Job:  [id number]

## 2013-03-07 NOTE — Anesthesia Preprocedure Evaluation (Addendum)
Anesthesia Evaluation  Patient identified by MRN, date of birth, ID band Patient awake    Reviewed: Allergy & Precautions, H&P , NPO status , Patient's Chart, lab work & pertinent test results  History of Anesthesia Complications (+) PONV  Airway Mallampati: II TM Distance: >3 FB Neck ROM: Full    Dental no notable dental hx.    Pulmonary asthma , sleep apnea , COPDCurrent Smoker,  breath sounds clear to auscultation  Pulmonary exam normal       Cardiovascular hypertension, Pt. on medications + CAD Rhythm:Regular Rate:Normal     Neuro/Psych Chronic pain. S/p lumbar surgery. S/p failed spinal cord stimulator 2012 negative neurological ROS  negative psych ROS   GI/Hepatic negative GI ROS, Neg liver ROS,   Endo/Other  diabetes, Type 2, Oral Hypoglycemic AgentsMorbid obesity  Renal/GU negative Renal ROS  negative genitourinary   Musculoskeletal negative musculoskeletal ROS (+)   Abdominal (+) + obese,   Peds negative pediatric ROS (+)  Hematology negative hematology ROS (+)   Anesthesia Other Findings   Reproductive/Obstetrics negative OB ROS                          Anesthesia Physical Anesthesia Plan  ASA: III  Anesthesia Plan: Spinal   Post-op Pain Management:    Induction: Intravenous  Airway Management Planned: Simple Face Mask  Additional Equipment:   Intra-op Plan:   Post-operative Plan:   Informed Consent: I have reviewed the patients History and Physical, chart, labs and discussed the procedure including the risks, benefits and alternatives for the proposed anesthesia with the patient or authorized representative who has indicated his/her understanding and acceptance.   Dental advisory given  Plan Discussed with: CRNA  Anesthesia Plan Comments:         Anesthesia Quick Evaluation

## 2013-03-07 NOTE — Transfer of Care (Signed)
Immediate Anesthesia Transfer of Care Note  Patient: [name]  Procedure(s) Performed: Procedure(s): LEFT PATELLA-FEMORAL ARTHROPLASTY (Left)  Patient Location: PACU  Anesthesia Type:Spinal  Level of Consciousness: awake, alert , oriented and patient cooperative  Airway & Oxygen Therapy: Patient Spontanous Breathing and Patient connected to face mask oxygen  Post-op Assessment: Report given to PACU RN and Post -op Vital signs reviewed and stable  Post vital signs: Reviewed and stable  Complications: No apparent anesthesia complications

## 2013-03-08 ENCOUNTER — Encounter (HOSPITAL_COMMUNITY): Payer: Self-pay | Admitting: Orthopedic Surgery

## 2013-03-08 LAB — GLUCOSE, CAPILLARY
Glucose-Capillary: 132 mg/dL — ABNORMAL HIGH (ref 70–99)
Glucose-Capillary: 95 mg/dL (ref 70–99)

## 2013-03-08 LAB — BASIC METABOLIC PANEL
Calcium: 8.6 mg/dL (ref 8.4–10.5)
GFR calc non Af Amer: >90 mL/min (ref >90)
Glucose, Bld: 152 mg/dL — ABNORMAL HIGH (ref 70–99)
Sodium: 140 mEq/L (ref 135–145)

## 2013-03-08 LAB — CBC
MCH: 27.1 pg (ref 26.0–34.0)
MCHC: 30.1 g/dL (ref 30.0–36.0)
Platelets: 183 10*3/uL (ref 150–400)

## 2013-03-08 MED ORDER — KETOROLAC TROMETHAMINE 15 MG/ML IJ SOLN: 15.0000 mg | Freq: Four times a day (QID) | INTRAMUSCULAR | Status: DC

## 2013-03-08 MED ORDER — KETOROLAC TROMETHAMINE 15 MG/ML IJ SOLN
15.0000 mg | Freq: Four times a day (QID) | INTRAMUSCULAR | Status: DC
  Administered 2013-03-08 – 2013-03-09 (×3): 15 mg via INTRAVENOUS
  Filled 2013-03-08 (×4): qty 1

## 2013-03-08 NOTE — Progress Notes (Signed)
Pt placed on Auto CPAP with 2 LPM O2 bleed in via home FFM, Pt tolerating well at this time, vital signs are WNL.  RT to monitor and assess as needed.

## 2013-03-08 NOTE — Progress Notes (Signed)
OT Cancellation Note  Patient Details Name: [name] MRN: [id number] DOB: Sep 24, 1960   Cancelled Treatment:    Reason Eval/Treat Not Completed: Other (comment)  Pt was lethargic with PT earlier and has been nauseous.  Will check back tomorrow for OT needs.    Ashton Sabine 03/08/2013, 3:25 PM Marica Otter, OTR/L [phone] 03/08/2013

## 2013-03-08 NOTE — Progress Notes (Signed)
Received orders for rw and commode.  Patients stated that she has a rolling walker at home already. Commode will be delivered to hospital room prior to d/c.

## 2013-03-08 NOTE — Progress Notes (Signed)
Utilization review completed.  

## 2013-03-08 NOTE — Evaluation (Signed)
Physical Therapy Evaluation Patient Details Name: [name] MRN: [id number] DOB: February 25, 1961 Today's Date: 03/08/2013 Time: 1143-1209 PT Time Calculation (min): 26 min  PT Assessment / Plan / Recommendation Clinical Impression  Pt is a 52 year old female s/p L knee patellofemoral arthroplasty.  Pt would benefit from acute PT services in order to improve independence with mobility and strengthen and increase ROM of L LE to prepare for d/c home. Pt reports spouse is disabled and she has 6 steps into home.  Due to lethargy, would recommend home by ambulance if leaving today or cognitive status remains unchanged.  Also recommend 24/7 assist as pt is increased fall risk or possibly ST-SNF if this is unavailable.    PT Assessment  Patient needs continued PT services    Follow Up Recommendations  Home health PT;Supervision/Assistance - 24 hour    Does the patient have the potential to tolerate intense rehabilitation      Barriers to Discharge        Equipment Recommendations  None recommended by PT    Recommendations for Other Services     Frequency Min 6X/week    Precautions / Restrictions Precautions Precautions: Fall;Knee Restrictions Weight Bearing Restrictions: No Other Position/Activity Restrictions: WBAT   Pertinent Vitals/Pain Pt reports max pain in L knee, premedicated, repositioned      Mobility  Bed Mobility Bed Mobility: Supine to Sit Supine to Sit: 4: Min assist;HOB elevated;With rails Details for Bed Mobility Assistance: assist for L LE support, verbal cues for technique Transfers Transfers: Sit to Stand;Stand to Sit Sit to Stand: 4: Min assist;With upper extremity assist Stand to Sit: 4: Min assist;With upper extremity assist Details for Transfer Assistance: verbal cues for safe technique, assist to rise and control descent Ambulation/Gait Ambulation/Gait Assistance: 4: Min assist Ambulation Distance (Feet): 35 Feet Assistive device: Rolling  walker Ambulation/Gait Assistance Details: verbal cues for sequence, RW distance, step length, increased lateral sway to R side, limited distance due to pain Gait Pattern: Lateral trunk lean to right;Decreased stance time - left;Decreased weight shift to left Gait velocity: decreased    Exercises     PT Diagnosis: Difficulty walking;Acute pain  PT Problem List: Decreased strength;Decreased range of motion;Decreased mobility;Decreased knowledge of use of DME;Decreased knowledge of precautions;Pain PT Treatment Interventions: DME instruction;Gait training;Stair training;Functional mobility training;Therapeutic activities;Therapeutic exercise;Patient/family education   PT Goals Acute Rehab PT Goals PT Goal Formulation: With patient Time For Goal Achievement: 03/15/13 Potential to Achieve Goals: Good Pt will go Supine/Side to Sit: with supervision PT Goal: Supine/Side to Sit - Progress: Goal set today Pt will go Sit to Stand: with supervision PT Goal: Sit to Stand - Progress: Goal set today Pt will go Stand to Sit: with supervision PT Goal: Stand to Sit - Progress: Goal set today Pt will Ambulate: 51 - 150 feet;with supervision;with rolling walker PT Goal: Ambulate - Progress: Goal set today Pt will Go Up / Down Stairs: 6-9 stairs;with min assist;with rolling walker PT Goal: Up/Down Stairs - Progress: Goal set today Pt will Perform Home Exercise Program: with supervision, verbal cues required/provided PT Goal: Perform Home Exercise Program - Progress: Goal set today  Visit Information  Last PT Received On: 03/08/13 Assistance Needed: +2    Subjective Data  Subjective: pt very lethargic and states she is usually like that at home   Prior Functioning  Home Living Lives With: Spouse Type of Home: House Home Access: Stairs to enter [employer] of Steps: 6 Entrance Stairs-Rails: None Home Layout: One  level Home Adaptive Equipment: Straight cane;Walker -  rolling Additional Comments: Pt states she lives with spouse who is disabled. Prior Function Level of Independence: Independent with assistive device(s) Able to Take Stairs?: Yes Communication Communication: No difficulties    Cognition  Cognition Arousal/Alertness: Lethargic Behavior During Therapy: Flat affect Overall Cognitive Status: Within Functional Limits for tasks assessed (per pt lethargy WNL)    Extremity/Trunk Assessment Right Lower Extremity Assessment RLE ROM/Strength/Tone: Unable to fully assess RLE ROM/Strength/Tone Deficits: pt with increased lethargy however able to assist with cues so at least 3/5 throughout Left Lower Extremity Assessment LLE ROM/Strength/Tone: Unable to fully assess;Deficits;Due to pain LLE ROM/Strength/Tone Deficits: pt maintained knee extension for comfort   Balance    End of Session PT - End of Session Activity Tolerance: Patient limited by pain;Other (comment) (lethargy) Patient left: in chair;with call bell/phone within reach  GP     [facility] E 03/08/2013, 12:46 PM Zenovia Jarred, PT, DPT 03/08/2013 Pager: [phone]

## 2013-03-09 DIAGNOSIS — S99929A Unspecified injury of unspecified foot, initial encounter: Secondary | ICD-10-CM | POA: Diagnosis not present

## 2013-03-09 DIAGNOSIS — M25569 Pain in unspecified knee: Secondary | ICD-10-CM | POA: Diagnosis not present

## 2013-03-09 HISTORY — DX: Morbid (severe) obesity due to excess calories: E66.01

## 2013-03-09 LAB — CBC
HCT: 38.5 % (ref 36.0–46.0)
RDW: 15.3 % (ref 11.5–15.5)
WBC: 15 10*3/uL — ABNORMAL HIGH (ref 4.0–10.5)

## 2013-03-09 LAB — BASIC METABOLIC PANEL
BUN: 8 mg/dL (ref 6–23)
Chloride: 98 mEq/L (ref 96–112)
GFR calc Af Amer: >90 mL/min (ref >90)
Potassium: 4.4 mEq/L (ref 3.5–5.1)
Sodium: 136 mEq/L (ref 135–145)

## 2013-03-09 LAB — GLUCOSE, CAPILLARY

## 2013-03-09 MED ORDER — RIVAROXABAN 10 MG PO TABS: 10.0000 mg | ORAL_TABLET | ORAL | Status: DC

## 2013-03-09 MED ORDER — OXYCODONE HCL 10 MG PO TABS: 10.0000 mg | ORAL_TABLET | ORAL | Status: DC | PRN

## 2013-03-09 MED ORDER — FERROUS SULFATE 325 (65 FE) MG PO TABS: 325.0000 mg | ORAL_TABLET | Freq: Three times a day (TID) | ORAL | Status: DC

## 2013-03-09 MED ORDER — POLYETHYLENE GLYCOL 3350 17 G PO PACK: 17.0000 g | PACK | Freq: Two times a day (BID) | ORAL | Status: DC

## 2013-03-09 MED ORDER — ASPIRIN EC 325 MG PO TBEC: 325.0000 mg | DELAYED_RELEASE_TABLET | Freq: Two times a day (BID) | ORAL | Status: DC

## 2013-03-09 MED ORDER — DSS 100 MG PO CAPS: 100.0000 mg | ORAL_CAPSULE | Freq: Two times a day (BID) | ORAL | Status: DC

## 2013-03-09 NOTE — Progress Notes (Signed)
PT Treatment Note  Pt ambulated again in hallway with min assist for occasional unsteadiness.  Pt continues to be lethargic however more alert this session.  Pt reports daughter may or may not be able to assist at home.  Pt to d/c home by ambulance later today.  Recommended SNF however this is not a possibly so HHPT.   03/09/13 1500  PT Visit Information  Last PT Received On 03/09/13  Assistance Needed +1  PT Time Calculation  PT Start Time 1401  PT Stop Time 1413  PT Time Calculation (min) 12 min  Subjective Data  Subjective I'm usually on 3L at home.  Precautions  Precautions Fall;Knee  Restrictions  Other Position/Activity Restrictions WBAT  Cognition  Arousal/Alertness Lethargic  Behavior During Therapy WFL for tasks assessed/performed  Overall Cognitive Status Within Functional Limits for tasks assessed  Bed Mobility  Bed Mobility Not assessed  Transfers  Transfers Sit to Stand;Stand to Sit  Sit to Stand 4: Min assist;With upper extremity assist;From bed  Stand to Sit 4: Min assist;With upper extremity assist;To bed;To chair/3-in-1  Details for Transfer Assistance pt up after using bedside commode with nsg tech and upon nsg tech going to retrieve O2 tank pt became wobbly so assisted back to sitting for break, verbal cues for safe technique and assist to rise and control descent  Ambulation/Gait  Ambulation/Gait Assistance 4: Min assist  Ambulation Distance (Feet) 36 Feet  Assistive device Rolling walker  Ambulation/Gait Assistance Details assist for occasional unsteadiness, verbal cues for technique and safety  Gait Pattern Lateral trunk lean to right;Decreased stance time - left;Decreased weight shift to left  Gait velocity decreased  PT - End of Session  Equipment Utilized During Treatment Gait belt  Activity Tolerance Patient limited by fatigue;Patient limited by lethargy  Patient left in chair;with call bell/phone within reach  PT - Assessment/Plan  PT Plan Current  plan remains appropriate  Follow Up Recommendations Supervision/Assistance - 24 hour;SNF  PT equipment None recommended by PT  PT Goal Progression  Progress towards PT goals Progressing toward goals  PT General Charges  $$ ACUTE PT VISIT 1 Procedure  PT Treatments  $Gait Training 8-22 mins   Zenovia Jarred, PT, DPT 03/09/2013 Pager: [phone]

## 2013-03-09 NOTE — Evaluation (Signed)
Occupational Therapy Evaluation Patient Details Name: [name] MRN: [id number] DOB: 04-15-1961 Today's Date: 03/09/2013 Time: 4098-1191 OT Time Calculation (min): 23 min  OT Assessment / Plan / Recommendation Clinical Impression  Pt is s/p L UKR and displays 10/10 migraine pain and is also limited by nausea. Overall she could only tolerate back to bed from chair. She will benefit from skilled OT services to improve ADL independence to reach PLOF.    OT Assessment  Patient needs continued OT Services    Follow Up Recommendations  SNF;Supervision/Assistance - 24 hour    Barriers to Discharge      Equipment Recommendations  Tub/shower seat (if pt desires to shower initially versus sponge)    Recommendations for Other Services    Frequency  Min 2X/week    Precautions / Restrictions Precautions Precautions: Fall;Knee Restrictions Weight Bearing Restrictions: No Other Position/Activity Restrictions: WBAT   Pertinent Vitals/Pain 10/10 migraine pain. Return to bed and notified nursing. Pt sats decrease with activity to 81% on RA. Replaced O2 back up to 95%    ADL  Eating/Feeding: Simulated;Independent Where Assessed - Eating/Feeding: Chair Grooming: Simulated;Wash/dry face;Set up;Supervision/safety Where Assessed - Grooming: Supported sitting Upper Body Bathing: Simulated;Chest;Right arm;Left arm;Abdomen;Set up;Supervision/safety Where Assessed - Upper Body Bathing: Unsupported sitting Lower Body Bathing: Simulated;Moderate assistance Where Assessed - Lower Body Bathing: Supported sit to stand Upper Body Dressing: Simulated;Set up;Supervision/safety Where Assessed - Upper Body Dressing: Unsupported sitting Lower Body Dressing: Simulated;Moderate assistance Where Assessed - Lower Body Dressing: Supported sit to stand Toilet Transfer: Mining engineer Method: Stand pivot Toileting - Clothing Manipulation and Hygiene: Simulated;Minimal  assistance Where Assessed - Engineer, mining and Hygiene: Standing Equipment Used: Rolling walker ADL Comments: Pt complaining of 10/10 migraine and nausea which both limited ability to particpate. Pt requesting to return to bed so assisted back to bed. Pt with eyes closed at times during session. Pt reports husband disabled at home and cant help much.    OT Diagnosis: Generalized weakness  OT Problem List: Decreased strength;Decreased activity tolerance;Decreased knowledge of use of DME or AE;Pain OT Treatment Interventions: Self-care/ADL training;Therapeutic activities;DME and/or AE instruction;Patient/family education   OT Goals(Current goals can be found in the care plan section) Acute Rehab OT Goals Patient Stated Goal: none stated. agreeable to OT OT Goal Formulation: With patient Time For Goal Achievement: 03/16/13 Potential to Achieve Goals: Good (one pain improves) ADL Goals Pt Will Perform Grooming: with supervision;standing Pt Will Perform Lower Body Bathing: with supervision;with adaptive equipment;sit to/from stand Pt Will Perform Lower Body Dressing: with supervision;sit to/from stand;with adaptive equipment Pt Will Transfer to Toilet: with supervision;ambulating;regular height toilet;grab bars Pt Will Perform Toileting - Clothing Manipulation and hygiene: with supervision;sit to/from stand  Visit Information  Last OT Received On: 03/09/13 Assistance Needed: +1 History of Present Illness: [name], 52 y.o. female, has a history of pain and functional disability in the left knee due to arthritis and has failed non-surgical conservative treatments for greater than 12 weeks to includeNSAID's and/or analgesics, corticosteriod injections, viscosupplementation injections, use of assistive devices and activity modification.         Prior Functioning     Home Living Family/patient expects to be discharged to:: Private residence Available Help at Discharge:  Spouse/Significant other;Other (Comment) (who is disabled) Type of Home: House Home Access: Stairs to enter [employer] of Steps: 6 Entrance Stairs-Rails: None Home Layout: One level Home Equipment: Grab bars - toilet Additional Comments: Pt states she lives with spouse who is  disabled. Prior Function Level of Independence: Independent with assistive device(s) Communication Communication: No difficulties         Vision/Perception     Cognition  Cognition Arousal/Alertness: Lethargic Behavior During Therapy: WFL for tasks assessed/performed Overall Cognitive Status: Within Functional Limits for tasks assessed    Extremity/Trunk Assessment Upper Extremity Assessment Upper Extremity Assessment: Overall WFL for tasks assessed Lower Extremity Assessment Lower Extremity Assessment: Overall WFL for tasks assessed     Mobility Bed Mobility Bed Mobility: Sit to Supine Supine to Sit: 4: Min assist;HOB elevated;With rails Sit to Supine: 4: Min guard;HOB flat Details for Bed Mobility Assistance: assist for L LE support, verbal cues for technique Transfers Transfers: Sit to Stand;Stand to Sit Sit to Stand: 4: Min assist;With upper extremity assist;From chair/3-in-1 Stand to Sit: 4: Min assist;With upper extremity assist;To bed Details for Transfer Assistance: verbal cues for hand placement and to extend L LE out in front        Balance Balance Balance Assessed: Yes Dynamic Standing Balance Dynamic Standing - Level of Assistance: 4: Min assist   End of Session OT - End of Session Activity Tolerance: Patient limited by lethargy;Other (comment);Patient limited by pain (and nausea) Patient left: in bed;with call bell/phone within reach  GO     [name] [phone] 03/09/2013, 11:23 AM

## 2013-03-09 NOTE — Progress Notes (Signed)
   Subjective: 1 Day Post-Op Procedure(s) (LRB): LEFT PATELLA-FEMORAL ARTHROPLASTY (Left)   Patient reports pain as moderate, pain not well controlled. No events throughout the night.   Objective:   VITALS:   Filed Vitals:   03/08/13 0547  BP: 128/81  Pulse: 112  Temp: 97.6 F (36.4 C)  Resp: 16    Neurovascular intact Dorsiflexion/Plantar flexion intact Incision: dressing C/D/I No cellulitis present Compartment soft  LABS  Recent Labs  03/08/13 0415  HGB 11.3*  HCT 37.5  WBC 13.2*  PLT 183     Recent Labs  03/08/13 0415  NA 140  K 4.2  BUN 9  CREATININE 0.62  GLUCOSE 152*     Assessment/Plan: 1 Day Post-Op Procedure(s) (LRB): LEFT PATELLA-FEMORAL ARTHROPLASTY (Left) HV drain d/c'ed Foley cath d/c'ed Advance diet Up with therapy D/C IV fluids Discharge home with home health eventually, when ready  Morbid Obesity (BMI >40)  Estimated body mass index is 44.05 kg/(m^2) as calculated from the following:   Height as of this encounter: 5\' 1"  (1.549 m).   Weight as of this encounter: 105.688 kg (233 lb). Patient also counseled that weight may inhibit the healing process Patient counseled that losing weight will help with future health issues       Anastasio Auerbach. Marki Frede   PAC  03/09/2013, 7:56 AM

## 2013-03-09 NOTE — Progress Notes (Signed)
   Subjective: 2 Days Post-Op Procedure(s) (LRB): LEFT PATELLA-FEMORAL ARTHROPLASTY (Left)   Patient reports pain as moderate, states that she is having pain. She is told that she needs to watch the amount of pain medication that she takes at home. No events throughout the night. Ready to be discharged home.  Objective:   VITALS:   Filed Vitals:   03/09/13 0616  BP: 137/84  Pulse: 85  Temp: 98.5 F (36.9 C)  Resp: 16    Neurovascular intact Dorsiflexion/Plantar flexion intact Incision: dressing C/Wiggins/I No cellulitis present Compartment soft  LABS  Recent Labs  03/08/13 0415 03/09/13 0425  HGB 11.3* 11.3*  HCT 37.5 38.5  WBC 13.2* 15.0*  PLT 183 194     Recent Labs  03/08/13 0415 03/09/13 0425  NA 140 136  K 4.2 4.4  BUN 9 8  CREATININE 0.62 0.44*  GLUCOSE 152* 170*     Assessment/Plan: 2 Days Post-Op Procedure(s) (LRB): LEFT PATELLA-FEMORAL ARTHROPLASTY (Left) Up with therapy Discharge home with home health Follow [facility]. Follow up with [name] in 2 weeks.  Contact information:  [facility] [address], [address] [name] [address] [phone]    Morbid Obesity (BMI >40)  Estimated body mass index is 44.05 kg/(m^2) as calculated from the following:   Height as of this encounter: 5\' 1"  (1.549 m).   Weight as of this encounter: 105.688 kg (233 lb). Patient also counseled that weight may inhibit the healing process Patient counseled that losing weight will help with future health issues        [name]. [name]   PAC  03/09/2013, 7:51 AM

## 2013-03-09 NOTE — Progress Notes (Addendum)
CSW spoke with PT regarding d/c planning for pt. PT has recommended SNF/24/7 support at home/ ambulance transport home. PN reviewed. PA notes that pt is ready for d/c home today. Medicare will not cover SNF placement since pt has not had a qualifying hospital stay (pt had  less than 3 overnights). CSW can assist with arranging private pay SNF placement. Pt declined SNF when this was presented to her this am. Pt has been declining SNF since admission. CSW will assist with ambulance transport home if needed. CSW explained to pt that she may receive a bill for ambulance service. CSW is available to assist with d/c planning as needed.  Cori Razor LCSW [phone]  11:43  CSW spoke with pt's husband this am to assist with d/c planning. Spouse states they are unable to pay out of pocket for SNF placement. Ambulance transport home is needed. CSW will assist with transportation arrangements.  Cori Razor LCSW [phone]

## 2013-03-09 NOTE — Progress Notes (Signed)
Physical Therapy Treatment Patient Details Name: [name] MRN: [id number] DOB: 1960/10/16 Today's Date: 03/09/2013 Time: 4098-1191 PT Time Calculation (min): 26 min  PT Assessment / Plan / Recommendation  PT Comments   Pt still limited by pain both headache migraine and L knee as well as lethargy.  Pt unable to tolerate ambulation or perform stairs.  Recommend ST-SNF however if not possible, recommend 24/7 assist at home (spouse unable to provide physical assist) and ambulance home for safety.  Follow Up Recommendations  Supervision/Assistance - 24 hour;SNF     Does the patient have the potential to tolerate intense rehabilitation     Barriers to Discharge        Equipment Recommendations  None recommended by PT    Recommendations for Other Services    Frequency     Progress towards PT Goals Progress towards PT goals: Progressing toward goals  Plan Discharge plan needs to be updated    Precautions / Restrictions Precautions Precautions: Fall;Knee Restrictions Weight Bearing Restrictions: No Other Position/Activity Restrictions: WBAT   Pertinent Vitals/Pain RN notified of pain, ice packs applied to L knee and cool cloth to forehead    Mobility  Bed Mobility Bed Mobility: Supine to Sit Supine to Sit: 4: Min assist;HOB elevated;With rails Details for Bed Mobility Assistance: assist for L LE support, verbal cues for technique Transfers Transfers: Sit to Stand;Stand to Sit Sit to Stand: 4: Min assist;With upper extremity assist;From chair/3-in-1 Stand to Sit: 4: Min assist;With upper extremity assist;To chair/3-in-1 Details for Transfer Assistance: verbal cues for safe technique, assist to rise and control descent Ambulation/Gait Ambulation/Gait Assistance: 4: Min guard Ambulation Distance (Feet): 15 Feet Assistive device: Rolling walker Ambulation/Gait Assistance Details: verbal cues for RW distance, sequence, safe technique, pt limited by migraine headache and nausea  requesting to sit Gait Pattern: Lateral trunk lean to right;Decreased stance time - left;Decreased weight shift to left Gait velocity: decreased    Exercises Total Joint Exercises Ankle Circles/Pumps: AROM;Both;15 reps Quad Sets: AROM;Both;10 reps Short Arc QuadBarbaraann Boys;Left;15 reps Heel Slides: AAROM;Left;15 reps Hip ABduction/ADduction: AAROM;Left;15 reps   PT Diagnosis:    PT Problem List:   PT Treatment Interventions:     PT Goals    Visit Information  Last PT Received On: 03/09/13 Assistance Needed: +2    Subjective Data      Cognition  Cognition Arousal/Alertness: Lethargic Overall Cognitive Status: Within Functional Limits for tasks assessed (per pt lethargy WNL)    Balance     End of Session PT - End of Session Activity Tolerance: Patient limited by pain;Other (comment) (lethargy, nausea, migraine) Patient left: in chair;with call bell/phone within reach   GP     [facility] E 03/09/2013, 10:04 AM Zenovia Jarred, PT, DPT 03/09/2013 Pager: [phone]

## 2013-03-09 NOTE — Progress Notes (Signed)
[id number] Vimal Derego,Rn,BSn,CCM: Spoke with Eunice Blase of [facility] is taking this patient and is ready for the arrival to home.

## 2013-03-10 DIAGNOSIS — J449 Chronic obstructive pulmonary disease, unspecified: Secondary | ICD-10-CM | POA: Diagnosis not present

## 2013-03-10 DIAGNOSIS — Z471 Aftercare following joint replacement surgery: Secondary | ICD-10-CM | POA: Diagnosis not present

## 2013-03-10 DIAGNOSIS — E119 Type 2 diabetes mellitus without complications: Secondary | ICD-10-CM | POA: Diagnosis not present

## 2013-03-10 DIAGNOSIS — Z96659 Presence of unspecified artificial knee joint: Secondary | ICD-10-CM | POA: Diagnosis not present

## 2013-03-10 DIAGNOSIS — F329 Major depressive disorder, single episode, unspecified: Secondary | ICD-10-CM | POA: Diagnosis not present

## 2013-03-10 NOTE — Discharge Summary (Signed)
Physician Discharge Summary  Patient ID: [name] MRN: [id number] DOB/AGE: Nov 13, 1960 52 y.o.  Admit date: 03/07/2013 Discharge date: 03/09/2013   Procedures:  Procedure(s) (LRB): LEFT PATELLA-FEMORAL ARTHROPLASTY (Left)  Attending Physician:  Dr. Durene Romans   Admission Diagnoses:   Left knee patellofemoral OA / pain  Discharge Diagnoses:  Principal Problem:   S/P left PF UKR Active Problems:   Morbid obesity  Past Medical History  Diagnosis Date  . Obesity   . Hypertension   . GERD (gastroesophageal reflux disease)   . Chronic pain   . Pulmonary embolism   . COPD (chronic obstructive pulmonary disease)   . Tobacco abuse   . CAD (coronary artery disease)   . Spondylolisthesis, grade 2   . Deviated septum   . Perforated bowel   . Diabetes mellitus without complication   . Hypercholesterolemia   . PONV (postoperative nausea and vomiting)   . Depression   . Shortness of breath   . Asthma   . Headache(784.0)     migraines  . Arthritis   . Sleep apnea     HPI: [name], 52 y.o. female, has a history of pain and functional disability in the left knee due to arthritis and has failed non-surgical conservative treatments for greater than 12 weeks to includeNSAID's and/or analgesics, corticosteriod injections, viscosupplementation injections, use of assistive devices and activity modification. Onset of symptoms was abrupt, starting >10 years ago with gradually worsening course since that time. The patient noted no past surgery on the left knee(s). Patient currently rates pain in the left knee(s) at 9 out of 10 with activity. Patient has night pain, worsening of pain with activity and weight bearing, pain that interferes with activities of daily living, pain with passive range of motion, crepitus and joint swelling. Patient has evidence of periarticular osteophytes and joint space narrowing of the patellofemoral joint space by imaging studies. There is no active  infection. Risks, benefits and expectations were discussed with the patient. Patient understand the risks, benefits and expectations and wishes to proceed with surgery.   PCP: Johny Blamer, MD   Discharged Condition: good  Hospital Course:  Patient underwent the above stated procedure on 03/07/2013. Patient tolerated the procedure well and brought to the recovery room in good condition and subsequently to the floor.  POD #1 BP: 128/81 ; Pulse: 112 ; Temp: 97.6 F (36.4 C) ; Resp: 16  Pt's foley was removed, as well as the hemovac drain removed. IV was changed to a saline lock. Patient reports pain as moderate, pain not well controlled. No events throughout the night.  Neurovascular intact, dorsiflexion/plantar flexion intact, incision: dressing C/D/I, no cellulitis present and compartment soft.   LABS  Basename  03/08/13    0415   HGB  11.3  HCT  37.5   POD #2  BP: 137/84 ; Pulse: 85 ; Temp: 98.5 F (36.9 C) ; Resp: 16  Patient reports pain as moderate, states that she is having pain. She is told that she needs to watch the amount of pain medication that she takes at home. No events throughout the night. Ready to be discharged home. Neurovascular intact, dorsiflexion/plantar flexion intact, incision: dressing C/D/I, no cellulitis present and compartment soft.   LABS  Basename  03/09/13    0425   HGB  11.3  HCT  38.5    Discharge Exam: General appearance: alert and no distress Extremities: Homans sign is negative, no sign of DVT, no edema, redness or  tenderness in the calves or thighs and no ulcers, gangrene or trophic changes  Disposition:   Home-Health Care Svc with follow up in 2 weeks   Follow-up Information   Follow up with Shelda Pal, MD. Schedule an appointment as soon as possible for a visit in 2 weeks.   Contact information:   [address] [place] [address] [phone]       Discharge Orders   Future Orders Complete By Expires     Call  MD / Call 911  As directed     Comments:      If you experience chest pain or shortness of breath, CALL 911 and be transported to the hospital emergency room.  If you develope a fever above 101 F, pus (white drainage) or increased drainage or redness at the wound, or calf pain, call your surgeon's office.    Change dressing  As directed     Comments:      Maintain surgical dressing for 10-14 days, then change the dressing daily with sterile 4 x 4 inch gauze dressing and tape. Keep the area dry and clean.    Constipation Prevention  As directed     Comments:      Drink plenty of fluids.  Prune juice may be helpful.  You may use a stool softener, such as Colace (over the counter) 100 mg twice a day.  Use MiraLax (over the counter) for constipation as needed.    Diet - low sodium heart healthy  As directed     Discharge instructions  As directed     Comments:      Maintain surgical dressing for 10-14 days, then replace with gauze and tape. Keep the area dry and clean until follow up. Follow up in 2 weeks at [facility]. Call with any questions or concerns.    Increase activity slowly as tolerated  As directed     TED hose  As directed     Comments:      Use stockings (TED hose) for 2 weeks on both leg(s).  You may remove them at night for sleeping.    Weight bearing as tolerated  As directed          Medication List    STOP taking these medications       meloxicam 15 MG tablet  Commonly known as:  MOBIC     oxyCODONE-acetaminophen 10-325 MG per tablet  Commonly known as:  PERCOCET      TAKE these medications       albuterol 108 (90 BASE) MCG/ACT inhaler  Commonly known as:  PROVENTIL HFA;VENTOLIN HFA  Inhale 2 puffs into the lungs every 4 (four) hours as needed. Shortness of breath/wheezing.     alprazolam 2 MG tablet  Commonly known as:  XANAX  Take 1-2 mg by mouth 2 (two) times daily. Takes 1/2 in the morning and 1 tablets at bedtime     aspirin EC 325 MG tablet    Take 1 tablet (325 mg total) by mouth 2 (two) times daily.     bumetanide 1 MG tablet  Commonly known as:  BUMEX  Take 1 mg by mouth daily. 1-2  Bid daily     clotrimazole-betamethasone cream  Commonly known as:  LOTRISONE  Apply 1 application topically at bedtime and may repeat dose one time if needed. Apply to hands     cyclobenzaprine 10 MG tablet  Commonly known as:  FLEXERIL  Take 10 mg  by mouth 2 (two) times daily. Muscle spasm, scheduled dose     DSS 100 MG Caps  Take 100 mg by mouth 2 (two) times daily.     escitalopram 10 MG tablet  Commonly known as:  LEXAPRO  Take 10 mg by mouth every evening.     esomeprazole 40 MG capsule  Commonly known as:  NEXIUM  Take 40 mg by mouth 2 (two) times daily.     estradiol 2 MG tablet  Commonly known as:  ESTRACE  Take 2 mg by mouth daily.     ferrous sulfate 325 (65 FE) MG tablet  Take 1 tablet (325 mg total) by mouth 3 (three) times daily after meals.     fluticasone 50 MCG/ACT nasal spray  Commonly known as:  FLONASE  Place 2 sprays into the nose at bedtime.     hydrocortisone 2.5 % rectal cream  Commonly known as:  ANUSOL-HC  Place 1 application rectally 2 (two) times daily as needed. For pain due to hemmorroids     lidocaine 5 %  Commonly known as:  LIDODERM  Place 1-3 patches onto the skin daily as needed. Remove & Discard patch within 12 hours or as directed by MD  For pain     lidocaine 5 % ointment  Commonly known as:  XYLOCAINE  Apply 1 application topically daily. To hemmorroids     metFORMIN 500 MG tablet  Commonly known as:  GLUCOPHAGE  Take 500 mg by mouth 2 (two) times daily with a meal.     metolazone 10 MG tablet  Commonly known as:  ZAROXOLYN  Take 10-20 mg by mouth daily. May take a 2nd dose for excessive edema     nitroGLYCERIN 0.4 MG SL tablet  Commonly known as:  NITROSTAT  Place 0.4 mg under the tongue every 5 (five) minutes as needed for chest pain.     nystatin 100000 UNIT/GM Powd   Apply 1 g topically 3 (three) times daily as needed. To rashy areas     olopatadine 0.1 % ophthalmic solution  Commonly known as:  PATANOL  Place 1 drop into both eyes 2 (two) times daily.     Oxycodone HCl 10 MG Tabs  Take 1-2 tablets (10-20 mg total) by mouth every 4 (four) hours as needed for pain.     oxyCODONE 20 MG 12 hr tablet  Commonly known as:  OXYCONTIN  Take 20 mg by mouth every 12 (twelve) hours.     PHAZYME 180 MG Caps  Generic drug:  Simethicone  Take 1 capsule by mouth 4 (four) times daily as needed. For gas/bloating     polyethylene glycol packet  Commonly known as:  MIRALAX / GLYCOLAX  Take 17 g by mouth 2 (two) times daily.     potassium chloride 10 MEQ tablet  Commonly known as:  K-DUR  Take 30 mEq by mouth 2 (two) times daily.     promethazine 25 MG tablet  Commonly known as:  PHENERGAN  Take 25 mg by mouth every 6 (six) hours as needed. nausea     rivaroxaban 10 MG Tabs tablet  Commonly known as:  XARELTO  Take 1 tablet (10 mg total) by mouth daily.     simvastatin 20 MG tablet  Commonly known as:  ZOCOR  Take 20 mg by mouth at bedtime.         Signed: Anastasio Auerbach. Shaeley Segall   PAC  03/10/2013, 9:23 AM

## 2013-03-11 DIAGNOSIS — F329 Major depressive disorder, single episode, unspecified: Secondary | ICD-10-CM | POA: Diagnosis not present

## 2013-03-11 DIAGNOSIS — E119 Type 2 diabetes mellitus without complications: Secondary | ICD-10-CM | POA: Diagnosis not present

## 2013-03-11 DIAGNOSIS — Z471 Aftercare following joint replacement surgery: Secondary | ICD-10-CM | POA: Diagnosis not present

## 2013-03-11 DIAGNOSIS — J449 Chronic obstructive pulmonary disease, unspecified: Secondary | ICD-10-CM | POA: Diagnosis not present

## 2013-03-11 DIAGNOSIS — Z96659 Presence of unspecified artificial knee joint: Secondary | ICD-10-CM | POA: Diagnosis not present

## 2013-03-13 DIAGNOSIS — F329 Major depressive disorder, single episode, unspecified: Secondary | ICD-10-CM | POA: Diagnosis not present

## 2013-03-13 DIAGNOSIS — J449 Chronic obstructive pulmonary disease, unspecified: Secondary | ICD-10-CM | POA: Diagnosis not present

## 2013-03-13 DIAGNOSIS — Z471 Aftercare following joint replacement surgery: Secondary | ICD-10-CM | POA: Diagnosis not present

## 2013-03-13 DIAGNOSIS — E119 Type 2 diabetes mellitus without complications: Secondary | ICD-10-CM | POA: Diagnosis not present

## 2013-03-13 DIAGNOSIS — Z96659 Presence of unspecified artificial knee joint: Secondary | ICD-10-CM | POA: Diagnosis not present

## 2013-03-14 ENCOUNTER — Inpatient Hospital Stay (HOSPITAL_COMMUNITY): Payer: Medicare Other | Admitting: Anesthesiology

## 2013-03-14 ENCOUNTER — Inpatient Hospital Stay (HOSPITAL_COMMUNITY)
Admission: AD | Admit: 2013-03-14 | Discharge: 2013-03-21 | DRG: 902 | Disposition: A | Discharge status: Skilled Nursing Facility | Payer: Medicare Other | Source: Ambulatory Visit | Attending: Orthopedic Surgery | Admitting: Orthopedic Surgery

## 2013-03-14 ENCOUNTER — Encounter (HOSPITAL_COMMUNITY): Payer: Self-pay | Admitting: Anesthesiology

## 2013-03-14 ENCOUNTER — Encounter (HOSPITAL_COMMUNITY)
Admission: AD | Disposition: A | Discharge status: Skilled Nursing Facility | Payer: Self-pay | Source: Ambulatory Visit | Attending: Orthopedic Surgery

## 2013-03-14 ENCOUNTER — Encounter (HOSPITAL_COMMUNITY): Payer: Self-pay

## 2013-03-14 DIAGNOSIS — G4730 Sleep apnea, unspecified: Secondary | ICD-10-CM | POA: Diagnosis not present

## 2013-03-14 DIAGNOSIS — T8131XA Disruption of external operation (surgical) wound, not elsewhere classified, initial encounter: Principal | ICD-10-CM | POA: Diagnosis present

## 2013-03-14 DIAGNOSIS — J4489 Other specified chronic obstructive pulmonary disease: Secondary | ICD-10-CM | POA: Diagnosis present

## 2013-03-14 DIAGNOSIS — F17200 Nicotine dependence, unspecified, uncomplicated: Secondary | ICD-10-CM | POA: Diagnosis not present

## 2013-03-14 DIAGNOSIS — E876 Hypokalemia: Secondary | ICD-10-CM

## 2013-03-14 DIAGNOSIS — E872 Acidosis, unspecified: Secondary | ICD-10-CM | POA: Diagnosis present

## 2013-03-14 DIAGNOSIS — I2510 Atherosclerotic heart disease of native coronary artery without angina pectoris: Secondary | ICD-10-CM | POA: Diagnosis present

## 2013-03-14 DIAGNOSIS — G8929 Other chronic pain: Secondary | ICD-10-CM | POA: Diagnosis not present

## 2013-03-14 DIAGNOSIS — E7800 Pure hypercholesterolemia, unspecified: Secondary | ICD-10-CM | POA: Diagnosis present

## 2013-03-14 DIAGNOSIS — G4733 Obstructive sleep apnea (adult) (pediatric): Secondary | ICD-10-CM | POA: Diagnosis present

## 2013-03-14 DIAGNOSIS — Z96659 Presence of unspecified artificial knee joint: Secondary | ICD-10-CM

## 2013-03-14 DIAGNOSIS — M129 Arthropathy, unspecified: Secondary | ICD-10-CM | POA: Diagnosis present

## 2013-03-14 DIAGNOSIS — T8189XA Other complications of procedures, not elsewhere classified, initial encounter: Secondary | ICD-10-CM | POA: Diagnosis not present

## 2013-03-14 DIAGNOSIS — Z5189 Encounter for other specified aftercare: Secondary | ICD-10-CM | POA: Diagnosis not present

## 2013-03-14 DIAGNOSIS — F3289 Other specified depressive episodes: Secondary | ICD-10-CM | POA: Diagnosis present

## 2013-03-14 DIAGNOSIS — E119 Type 2 diabetes mellitus without complications: Secondary | ICD-10-CM | POA: Diagnosis present

## 2013-03-14 DIAGNOSIS — T8131XD Disruption of external operation (surgical) wound, not elsewhere classified, subsequent encounter: Secondary | ICD-10-CM

## 2013-03-14 DIAGNOSIS — J9819 Other pulmonary collapse: Secondary | ICD-10-CM | POA: Diagnosis present

## 2013-03-14 DIAGNOSIS — J9610 Chronic respiratory failure, unspecified whether with hypoxia or hypercapnia: Secondary | ICD-10-CM | POA: Diagnosis not present

## 2013-03-14 DIAGNOSIS — I10 Essential (primary) hypertension: Secondary | ICD-10-CM | POA: Diagnosis present

## 2013-03-14 DIAGNOSIS — L299 Pruritus, unspecified: Secondary | ICD-10-CM | POA: Diagnosis present

## 2013-03-14 DIAGNOSIS — J449 Chronic obstructive pulmonary disease, unspecified: Secondary | ICD-10-CM | POA: Diagnosis not present

## 2013-03-14 DIAGNOSIS — F329 Major depressive disorder, single episode, unspecified: Secondary | ICD-10-CM | POA: Diagnosis present

## 2013-03-14 DIAGNOSIS — Y831 Surgical operation with implant of artificial internal device as the cause of abnormal reaction of the patient, or of later complication, without mention of misadventure at the time of the procedure: Secondary | ICD-10-CM | POA: Diagnosis present

## 2013-03-14 DIAGNOSIS — K219 Gastro-esophageal reflux disease without esophagitis: Secondary | ICD-10-CM | POA: Diagnosis present

## 2013-03-14 DIAGNOSIS — Z86711 Personal history of pulmonary embolism: Secondary | ICD-10-CM

## 2013-03-14 DIAGNOSIS — Z720 Tobacco use: Secondary | ICD-10-CM

## 2013-03-14 DIAGNOSIS — R0609 Other forms of dyspnea: Secondary | ICD-10-CM | POA: Diagnosis not present

## 2013-03-14 DIAGNOSIS — G894 Chronic pain syndrome: Secondary | ICD-10-CM | POA: Diagnosis present

## 2013-03-14 DIAGNOSIS — Z6841 Body Mass Index (BMI) 40.0 and over, adult: Secondary | ICD-10-CM

## 2013-03-14 DIAGNOSIS — E662 Morbid (severe) obesity with alveolar hypoventilation: Secondary | ICD-10-CM | POA: Diagnosis present

## 2013-03-14 DIAGNOSIS — M25569 Pain in unspecified knee: Secondary | ICD-10-CM | POA: Diagnosis not present

## 2013-03-14 DIAGNOSIS — R0689 Other abnormalities of breathing: Secondary | ICD-10-CM

## 2013-03-14 DIAGNOSIS — S99929A Unspecified injury of unspecified foot, initial encounter: Secondary | ICD-10-CM | POA: Diagnosis not present

## 2013-03-14 HISTORY — PX: IRRIGATION AND DEBRIDEMENT KNEE: SHX5185

## 2013-03-14 LAB — CBC WITH DIFFERENTIAL/PLATELET
Eosinophils Relative: 3 % (ref 0–5)
HCT: 35 % — ABNORMAL LOW (ref 36.0–46.0)
Lymphocytes Relative: 35 % (ref 12–46)
Lymphs Abs: 3.9 10*3/uL (ref 0.7–4.0)
MCV: 87.5 fL (ref 78.0–100.0)
Monocytes Absolute: 1.2 10*3/uL — ABNORMAL HIGH (ref 0.1–1.0)
Neutro Abs: 5.8 10*3/uL (ref 1.7–7.7)
RBC: 4 MIL/uL (ref 3.87–5.11)
WBC: 11.2 10*3/uL — ABNORMAL HIGH (ref 4.0–10.5)

## 2013-03-14 LAB — BASIC METABOLIC PANEL
CO2: 37 mEq/L — ABNORMAL HIGH (ref 19–32)
Calcium: 8.8 mg/dL (ref 8.4–10.5)
Chloride: 89 mEq/L — ABNORMAL LOW (ref 96–112)
Creatinine, Ser: 0.53 mg/dL (ref 0.50–1.10)
Glucose, Bld: 132 mg/dL — ABNORMAL HIGH (ref 70–99)
Sodium: 134 mEq/L — ABNORMAL LOW (ref 135–145)

## 2013-03-14 LAB — POCT I-STAT 4, (NA,K, GLUC, HGB,HCT): Potassium: 4.2 mEq/L (ref 3.5–5.1)

## 2013-03-14 LAB — PROTIME-INR
INR: 1.77 — ABNORMAL HIGH (ref 0.00–1.49)
Prothrombin Time: 20.1 seconds — ABNORMAL HIGH (ref 11.6–15.2)

## 2013-03-14 LAB — C-REACTIVE PROTEIN: CRP: 9.2 mg/dL — ABNORMAL HIGH (ref <0.60)

## 2013-03-14 SURGERY — IRRIGATION AND DEBRIDEMENT KNEE
Anesthesia: General | Site: Knee | Laterality: Left | Wound class: Clean

## 2013-03-14 MED ORDER — CEFAZOLIN SODIUM-DEXTROSE 2-3 GM-% IV SOLR
2.0000 g | Freq: Four times a day (QID) | INTRAVENOUS | Status: AC
  Administered 2013-03-15 (×2): 2 g via INTRAVENOUS
  Filled 2013-03-14 (×2): qty 50

## 2013-03-14 MED ORDER — FENTANYL CITRATE 0.05 MG/ML IJ SOLN
INTRAMUSCULAR | Status: DC | PRN
  Administered 2013-03-14: 50 ug via INTRAVENOUS
  Administered 2013-03-14: 100 ug via INTRAVENOUS
  Administered 2013-03-14: 50 ug via INTRAVENOUS

## 2013-03-14 MED ORDER — SUCCINYLCHOLINE CHLORIDE 20 MG/ML IJ SOLN
INTRAMUSCULAR | Status: DC | PRN
  Administered 2013-03-14: 100 mg via INTRAVENOUS

## 2013-03-14 MED ORDER — METFORMIN HCL 500 MG PO TABS
500.0000 mg | ORAL_TABLET | Freq: Two times a day (BID) | ORAL | Status: DC
  Administered 2013-03-15 – 2013-03-21 (×13): 500 mg via ORAL
  Filled 2013-03-14 (×16): qty 1

## 2013-03-14 MED ORDER — PROMETHAZINE HCL 25 MG/ML IJ SOLN
6.2500 mg | INTRAMUSCULAR | Status: DC | PRN
  Administered 2013-03-14: 6.25 mg via INTRAVENOUS

## 2013-03-14 MED ORDER — METOCLOPRAMIDE HCL 10 MG PO TABS: 5.0000 mg | ORAL_TABLET | Freq: Three times a day (TID) | ORAL | Status: DC | PRN

## 2013-03-14 MED ORDER — LIDOCAINE HCL (PF) 2 % IJ SOLN
INTRAMUSCULAR | Status: DC | PRN
  Administered 2013-03-14: 75 mg

## 2013-03-14 MED ORDER — OXYCODONE HCL 5 MG PO TABS: 10.0000 mg | ORAL_TABLET | ORAL | Status: DC | PRN

## 2013-03-14 MED ORDER — LACTATED RINGERS IV SOLN
INTRAVENOUS | Status: DC
  Administered 2013-03-14: 1000 mL via INTRAVENOUS

## 2013-03-14 MED ORDER — OXYCODONE HCL 10 MG PO TABS: 10.0000 mg | ORAL_TABLET | ORAL | Status: DC | PRN

## 2013-03-14 MED ORDER — OXYCODONE HCL 5 MG PO TABS
10.0000 mg | ORAL_TABLET | ORAL | Status: DC | PRN
  Administered 2013-03-15 – 2013-03-16 (×8): 20 mg via ORAL
  Administered 2013-03-17 (×2): 15 mg via ORAL
  Administered 2013-03-17 – 2013-03-19 (×8): 10 mg via ORAL
  Administered 2013-03-20 – 2013-03-21 (×9): 20 mg via ORAL
  Filled 2013-03-14 (×3): qty 4
  Filled 2013-03-14: qty 3
  Filled 2013-03-14 (×2): qty 4
  Filled 2013-03-14: qty 2
  Filled 2013-03-14 (×2): qty 3
  Filled 2013-03-14: qty 4
  Filled 2013-03-14: qty 2
  Filled 2013-03-14: qty 4
  Filled 2013-03-14: qty 1
  Filled 2013-03-14 (×2): qty 4
  Filled 2013-03-14: qty 2
  Filled 2013-03-14: qty 4
  Filled 2013-03-14: qty 1
  Filled 2013-03-14: qty 4
  Filled 2013-03-14: qty 2
  Filled 2013-03-14 (×2): qty 4
  Filled 2013-03-14: qty 2
  Filled 2013-03-14: qty 4
  Filled 2013-03-14: qty 2
  Filled 2013-03-14 (×3): qty 4
  Filled 2013-03-14: qty 2

## 2013-03-14 MED ORDER — RIVAROXABAN 10 MG PO TABS
10.0000 mg | ORAL_TABLET | Freq: Every day | ORAL | Status: DC
  Administered 2013-03-15: 10 mg via ORAL
  Filled 2013-03-14 (×3): qty 1

## 2013-03-14 MED ORDER — SODIUM CHLORIDE 0.9 % IV SOLN
INTRAVENOUS | Status: DC
  Administered 2013-03-14 – 2013-03-15 (×2): via INTRAVENOUS

## 2013-03-14 MED ORDER — CYCLOBENZAPRINE HCL 10 MG PO TABS
10.0000 mg | ORAL_TABLET | Freq: Three times a day (TID) | ORAL | Status: DC
  Administered 2013-03-14 – 2013-03-21 (×20): 10 mg via ORAL
  Filled 2013-03-14 (×27): qty 1

## 2013-03-14 MED ORDER — ACETAMINOPHEN 650 MG RE SUPP: 650.0000 mg | Freq: Four times a day (QID) | RECTAL | Status: DC | PRN

## 2013-03-14 MED ORDER — SIMETHICONE 80 MG PO CHEW
160.0000 mg | CHEWABLE_TABLET | Freq: Four times a day (QID) | ORAL | Status: DC | PRN
  Filled 2013-03-14: qty 2

## 2013-03-14 MED ORDER — ONDANSETRON HCL 4 MG/2ML IJ SOLN
INTRAMUSCULAR | Status: DC | PRN
  Administered 2013-03-14: 4 mg via INTRAVENOUS

## 2013-03-14 MED ORDER — ALBUTEROL SULFATE HFA 108 (90 BASE) MCG/ACT IN AERS
2.0000 | INHALATION_SPRAY | RESPIRATORY_TRACT | Status: DC | PRN
  Filled 2013-03-14: qty 6.7

## 2013-03-14 MED ORDER — SIMVASTATIN 20 MG PO TABS
20.0000 mg | ORAL_TABLET | Freq: Every day | ORAL | Status: DC
  Administered 2013-03-14 – 2013-03-20 (×7): 20 mg via ORAL
  Filled 2013-03-14 (×8): qty 1

## 2013-03-14 MED ORDER — PANTOPRAZOLE SODIUM 40 MG PO TBEC
80.0000 mg | DELAYED_RELEASE_TABLET | Freq: Every day | ORAL | Status: DC
  Administered 2013-03-15 – 2013-03-20 (×6): 80 mg via ORAL
  Filled 2013-03-14 (×7): qty 2

## 2013-03-14 MED ORDER — OLOPATADINE HCL 0.1 % OP SOLN
1.0000 [drp] | OPHTHALMIC | Status: DC | PRN
  Filled 2013-03-14: qty 5

## 2013-03-14 MED ORDER — NYSTATIN 100000 UNIT/GM EX POWD
1.0000 g | Freq: Three times a day (TID) | CUTANEOUS | Status: DC | PRN
  Filled 2013-03-14: qty 15

## 2013-03-14 MED ORDER — METOCLOPRAMIDE HCL 5 MG/ML IJ SOLN: 5.0000 mg | Freq: Three times a day (TID) | INTRAMUSCULAR | Status: DC | PRN

## 2013-03-14 MED ORDER — DOCUSATE SODIUM 100 MG PO CAPS
100.0000 mg | ORAL_CAPSULE | Freq: Two times a day (BID) | ORAL | Status: DC
  Administered 2013-03-14 – 2013-03-21 (×14): 100 mg via ORAL

## 2013-03-14 MED ORDER — ONDANSETRON HCL 4 MG PO TABS: 4.0000 mg | ORAL_TABLET | Freq: Four times a day (QID) | ORAL | Status: DC | PRN

## 2013-03-14 MED ORDER — CEFAZOLIN SODIUM-DEXTROSE 2-3 GM-% IV SOLR
2.0000 g | Freq: Once | INTRAVENOUS | Status: AC
  Administered 2013-03-14: 2 g via INTRAVENOUS
  Filled 2013-03-14: qty 50

## 2013-03-14 MED ORDER — MENTHOL 3 MG MT LOZG: 1.0000 | LOZENGE | OROMUCOSAL | Status: DC | PRN

## 2013-03-14 MED ORDER — FLUTICASONE PROPIONATE 50 MCG/ACT NA SUSP
2.0000 | Freq: Every evening | NASAL | Status: DC | PRN
  Filled 2013-03-14: qty 16

## 2013-03-14 MED ORDER — FERROUS SULFATE 325 (65 FE) MG PO TABS
325.0000 mg | ORAL_TABLET | Freq: Three times a day (TID) | ORAL | Status: DC
  Administered 2013-03-15 – 2013-03-21 (×19): 325 mg via ORAL
  Filled 2013-03-14 (×22): qty 1

## 2013-03-14 MED ORDER — SODIUM CHLORIDE 0.9 % IR SOLN
Status: DC | PRN
  Administered 2013-03-14: 3000 mL

## 2013-03-14 MED ORDER — ONDANSETRON HCL 4 MG/2ML IJ SOLN
4.0000 mg | Freq: Four times a day (QID) | INTRAMUSCULAR | Status: DC | PRN
  Administered 2013-03-20: 4 mg via INTRAVENOUS
  Filled 2013-03-14: qty 2

## 2013-03-14 MED ORDER — METOLAZONE 10 MG PO TABS
10.0000 mg | ORAL_TABLET | Freq: Every day | ORAL | Status: DC | PRN
  Filled 2013-03-14: qty 2

## 2013-03-14 MED ORDER — PROPOFOL 10 MG/ML IV BOLUS
INTRAVENOUS | Status: DC | PRN
  Administered 2013-03-14: 50 mg via INTRAVENOUS
  Administered 2013-03-14: 150 mg via INTRAVENOUS

## 2013-03-14 MED ORDER — HYDROMORPHONE HCL PF 1 MG/ML IJ SOLN
0.5000 mg | INTRAMUSCULAR | Status: DC | PRN
  Administered 2013-03-14 (×2): 2 mg via INTRAVENOUS
  Administered 2013-03-14 – 2013-03-15 (×3): 1 mg via INTRAVENOUS
  Administered 2013-03-15: 0.5 mg via INTRAVENOUS
  Administered 2013-03-15: 2 mg via INTRAVENOUS
  Administered 2013-03-16: 1 mg via INTRAVENOUS
  Filled 2013-03-14 (×2): qty 2
  Filled 2013-03-14: qty 1
  Filled 2013-03-14: qty 2
  Filled 2013-03-14: qty 1
  Filled 2013-03-14: qty 2
  Filled 2013-03-14: qty 1

## 2013-03-14 MED ORDER — OXYCODONE HCL ER 20 MG PO T12A
20.0000 mg | EXTENDED_RELEASE_TABLET | Freq: Two times a day (BID) | ORAL | Status: DC
  Administered 2013-03-14: 20 mg via ORAL
  Filled 2013-03-14 (×3): qty 1

## 2013-03-14 MED ORDER — ACETAMINOPHEN 325 MG PO TABS
650.0000 mg | ORAL_TABLET | Freq: Four times a day (QID) | ORAL | Status: DC | PRN
  Administered 2013-03-19 (×3): 650 mg via ORAL
  Filled 2013-03-14 (×3): qty 2

## 2013-03-14 MED ORDER — FENTANYL CITRATE 0.05 MG/ML IJ SOLN
25.0000 ug | INTRAMUSCULAR | Status: DC | PRN
  Administered 2013-03-14 (×2): 50 ug via INTRAVENOUS

## 2013-03-14 MED ORDER — NITROGLYCERIN 0.4 MG SL SUBL: 0.4000 mg | SUBLINGUAL_TABLET | SUBLINGUAL | Status: DC | PRN

## 2013-03-14 MED ORDER — 0.9 % SODIUM CHLORIDE (POUR BTL) OPTIME
TOPICAL | Status: DC | PRN
  Administered 2013-03-14: 1000 mL

## 2013-03-14 MED ORDER — ASPIRIN EC 325 MG PO TBEC
325.0000 mg | DELAYED_RELEASE_TABLET | Freq: Every day | ORAL | Status: DC
  Administered 2013-03-15: 325 mg via ORAL
  Filled 2013-03-14 (×2): qty 1

## 2013-03-14 MED ORDER — POTASSIUM CHLORIDE ER 10 MEQ PO TBCR
30.0000 meq | EXTENDED_RELEASE_TABLET | Freq: Two times a day (BID) | ORAL | Status: DC
  Administered 2013-03-14 – 2013-03-21 (×14): 30 meq via ORAL
  Filled 2013-03-14 (×15): qty 3

## 2013-03-14 MED ORDER — POLYETHYLENE GLYCOL 3350 17 G PO PACK: 17.0000 g | PACK | Freq: Two times a day (BID) | ORAL | Status: DC | PRN

## 2013-03-14 MED ORDER — ESCITALOPRAM OXALATE 10 MG PO TABS
10.0000 mg | ORAL_TABLET | Freq: Every day | ORAL | Status: DC
  Administered 2013-03-14 – 2013-03-20 (×7): 10 mg via ORAL
  Filled 2013-03-14 (×8): qty 1

## 2013-03-14 MED ORDER — POLYETHYLENE GLYCOL 3350 17 G PO PACK
17.0000 g | PACK | Freq: Two times a day (BID) | ORAL | Status: DC
  Administered 2013-03-14 – 2013-03-20 (×10): 17 g via ORAL

## 2013-03-14 MED ORDER — PHENOL 1.4 % MT LIQD: 1.0000 | OROMUCOSAL | Status: DC | PRN

## 2013-03-14 MED ORDER — SIMETHICONE 180 MG PO CAPS: 1.0000 | ORAL_CAPSULE | Freq: Four times a day (QID) | ORAL | Status: DC | PRN

## 2013-03-14 MED ORDER — LACTATED RINGERS IV SOLN: INTRAVENOUS | Status: DC

## 2013-03-14 MED ORDER — BUMETANIDE 2 MG PO TABS
2.0000 mg | ORAL_TABLET | Freq: Two times a day (BID) | ORAL | Status: DC
  Administered 2013-03-14 – 2013-03-20 (×13): 2 mg via ORAL
  Filled 2013-03-14 (×16): qty 1

## 2013-03-14 MED ORDER — ALPRAZOLAM 1 MG PO TABS
2.0000 mg | ORAL_TABLET | Freq: Every day | ORAL | Status: DC
  Administered 2013-03-14 – 2013-03-20 (×7): 2 mg via ORAL
  Filled 2013-03-14 (×5): qty 2
  Filled 2013-03-14: qty 1

## 2013-03-14 MED ORDER — PROMETHAZINE HCL 25 MG PO TABS: 25.0000 mg | ORAL_TABLET | Freq: Four times a day (QID) | ORAL | Status: DC | PRN

## 2013-03-14 MED ORDER — PNEUMOCOCCAL VAC POLYVALENT 25 MCG/0.5ML IJ INJ
0.5000 mL | INJECTION | INTRAMUSCULAR | Status: AC
  Administered 2013-03-15: 0.5 mL via INTRAMUSCULAR
  Filled 2013-03-14 (×2): qty 0.5

## 2013-03-14 MED ORDER — ALPRAZOLAM 1 MG PO TABS
1.0000 mg | ORAL_TABLET | Freq: Every day | ORAL | Status: DC
  Administered 2013-03-15 – 2013-03-21 (×7): 1 mg via ORAL
  Filled 2013-03-14 (×6): qty 1
  Filled 2013-03-14: qty 2
  Filled 2013-03-14 (×2): qty 1

## 2013-03-14 MED ORDER — ASPIRIN EC 325 MG PO TBEC: 325.0000 mg | DELAYED_RELEASE_TABLET | Freq: Two times a day (BID) | ORAL | Status: DC

## 2013-03-14 MED ORDER — LIDOCAINE 5 % EX PTCH
1.0000 | MEDICATED_PATCH | Freq: Every day | CUTANEOUS | Status: DC | PRN
  Filled 2013-03-14: qty 3

## 2013-03-14 MED ORDER — HYDROMORPHONE HCL PF 1 MG/ML IJ SOLN
INTRAMUSCULAR | Status: DC | PRN
  Administered 2013-03-14 (×2): 1 mg via INTRAVENOUS

## 2013-03-14 MED ORDER — ESTRADIOL 2 MG PO TABS
2.0000 mg | ORAL_TABLET | Freq: Every day | ORAL | Status: DC
  Administered 2013-03-15 – 2013-03-21 (×7): 2 mg via ORAL
  Filled 2013-03-14 (×7): qty 1

## 2013-03-14 SURGICAL SUPPLY — 42 items
BAG ZIPLOCK 12X15 (MISCELLANEOUS) ×2 IMPLANT
BANDAGE ESMARK 6X9 LF (GAUZE/BANDAGES/DRESSINGS) ×1 IMPLANT
BANDAGE GAUZE ELAST BULKY 4 IN (GAUZE/BANDAGES/DRESSINGS) IMPLANT
BNDG ESMARK 6X9 LF (GAUZE/BANDAGES/DRESSINGS) ×2
CLOTH BEACON ORANGE TIMEOUT ST (SAFETY) ×2 IMPLANT
CUFF TOURN SGL QUICK 18 (TOURNIQUET CUFF) IMPLANT
CUFF TOURN SGL QUICK 24 (TOURNIQUET CUFF)
CUFF TOURN SGL QUICK 34 (TOURNIQUET CUFF) ×1
CUFF TRNQT CYL 24X4X40X1 (TOURNIQUET CUFF) IMPLANT
CUFF TRNQT CYL 34X4X40X1 (TOURNIQUET CUFF) ×1 IMPLANT
DERMABOND ADVANCED (GAUZE/BANDAGES/DRESSINGS) ×2
DERMABOND ADVANCED .7 DNX12 (GAUZE/BANDAGES/DRESSINGS) ×2 IMPLANT
DRAIN PENROSE 18X1/2 LTX STRL (DRAIN) IMPLANT
DRSG AQUACEL AG ADV 3.5X 6 (GAUZE/BANDAGES/DRESSINGS) ×2 IMPLANT
DRSG PAD ABDOMINAL 8X10 ST (GAUZE/BANDAGES/DRESSINGS) IMPLANT
DURAPREP 26ML APPLICATOR (WOUND CARE) ×2 IMPLANT
ELECT REM PT RETURN 9FT ADLT (ELECTROSURGICAL) ×2
ELECTRODE REM PT RTRN 9FT ADLT (ELECTROSURGICAL) ×1 IMPLANT
EVACUATOR 1/8 PVC DRAIN (DRAIN) ×2 IMPLANT
GAUZE XEROFORM 1X8 LF (GAUZE/BANDAGES/DRESSINGS) ×2 IMPLANT
GLOVE BIOGEL PI IND STRL 7.5 (GLOVE) ×1 IMPLANT
GLOVE BIOGEL PI IND STRL 8 (GLOVE) ×1 IMPLANT
GLOVE BIOGEL PI INDICATOR 7.5 (GLOVE) ×1
GLOVE BIOGEL PI INDICATOR 8 (GLOVE) ×1
GLOVE ECLIPSE 8.0 STRL XLNG CF (GLOVE) IMPLANT
GLOVE ORTHO TXT STRL SZ7.5 (GLOVE) ×4 IMPLANT
GLOVE SURG ORTHO 8.0 STRL STRW (GLOVE) IMPLANT
GOWN BRE IMP PREV XXLGXLNG (GOWN DISPOSABLE) ×2 IMPLANT
GOWN STRL NON-REIN LRG LVL3 (GOWN DISPOSABLE) ×2 IMPLANT
HANDPIECE INTERPULSE COAX TIP (DISPOSABLE) ×1
KIT BASIN OR (CUSTOM PROCEDURE TRAY) ×2 IMPLANT
MANIFOLD NEPTUNE II (INSTRUMENTS) ×2 IMPLANT
PACK LOWER EXTREMITY WL (CUSTOM PROCEDURE TRAY) IMPLANT
PACK TOTAL JOINT (CUSTOM PROCEDURE TRAY) ×2 IMPLANT
PAD CAST 4YDX4 CTTN HI CHSV (CAST SUPPLIES) IMPLANT
PADDING CAST COTTON 4X4 STRL (CAST SUPPLIES)
POSITIONER SURGICAL ARM (MISCELLANEOUS) ×2 IMPLANT
SET HNDPC FAN SPRY TIP SCT (DISPOSABLE) ×1 IMPLANT
SOL PREP PROV IODINE SCRUB 4OZ (MISCELLANEOUS) IMPLANT
SPONGE GAUZE 4X4 12PLY (GAUZE/BANDAGES/DRESSINGS) IMPLANT
SYR CONTROL 10ML LL (SYRINGE) IMPLANT
TOWEL OR 17X26 10 PK STRL BLUE (TOWEL DISPOSABLE) ×2 IMPLANT

## 2013-03-14 NOTE — H&P (Signed)
[name] is an 52 y.o. female.    Chief Complaint:  Wound dehiscence of the left knee  HPI: Pt is a 52 y.o. female complaining of wound dehiscence of the left knee after having a patellofemoral knee arthroplasty on  03/07/2013.  This area was closed during the procedure with Derma Bond, but since being home have opened up. Do to some concerns of the opening and some drainage she was admitted to the hospital for an irrigation and debridement of the left knee.  Do to her previous arthroplasty it is decided to wash the knee out to help prevent possible infection.  She has been taking pain medications for a long time and knows that sometimes her pain is not easy to control. She has stated what medications she would like afterwards, We have stressed that we can't have her in a stupor from too much medication and will be working to prevent it. Various options are discussed with the patient. Risks, benefits and expectations were discussed with the patient. Patient understand the risks, benefits and expectations and wishes to proceed with surgery.   PCP:  Johny Blamer, MD  D/C Plans:    Home with HHPT  Post-op Meds: Rx given for ASA, Flexeril, Iron, Colace and MiraLax   Tranexamic Acid: Not to be given - CAD   Decadron: Not to be given - DM   FYI:  Will need CPAP order   Ok with Dilaudid   PMH: Past Medical History  Diagnosis Date  . Obesity   . Hypertension   . GERD (gastroesophageal reflux disease)   . Chronic pain   . Pulmonary embolism   . COPD (chronic obstructive pulmonary disease)   . Tobacco abuse   . CAD (coronary artery disease)   . Spondylolisthesis, grade 2   . Deviated septum   . Perforated bowel   . Diabetes mellitus without complication   . Hypercholesterolemia   . PONV (postoperative nausea and vomiting)   . Depression   . Shortness of breath   . Asthma   . Headache(784.0)     migraines  . Arthritis   . Sleep apnea     PSH: Past Surgical History    Procedure Laterality Date  . Abdominal hysterectomy    . Colon surgery    . Back surgery      lumbar  . Patella-femoral arthroplasty Left 03/07/2013    Procedure: LEFT PATELLA-FEMORAL ARTHROPLASTY;  Surgeon: Shelda Pal, MD;  Location: [facility];  Service: Orthopedics;  Laterality: Left;    Social History:  reports that she has been smoking Cigarettes.  She has been smoking about 1.00 pack per day. She has never used smokeless tobacco. She reports that she does not drink alcohol or use illicit drugs.  Allergies:  Allergies  Allergen Reactions  . Amoxicillin Nausea Only    6/14- states has taken Amoxicillin and no reaction  . Clindamycin/Lincomycin Nausea And Vomiting  . Doxycycline Nausea And Vomiting and Nausea Only  . Treximet (Sumatriptan-Naproxen Sodium) Nausea And Vomiting and Nausea Only  . Bactrim Hives and Rash    Medications: Current Facility-Administered Medications  Medication Dose Route Frequency Provider Last Rate Last Dose  . 0.9 %  sodium chloride infusion   Intravenous Continuous Genelle Gather Ashley Bultema, PA-C 10 mL/hr at 03/14/13 1217    . HYDROmorphone (DILAUDID) injection 0.5-2 mg  0.5-2 mg Intravenous Q2H PRN Genelle Gather Emmersen Garraway, PA-C   1 mg at 03/14/13 1446  . [START ON 03/15/2013] pneumococcal 23  valent vaccine (PNU-IMMUNE) injection 0.5 mL  0.5 mL Intramuscular Tomorrow-1000 Shelda Pal, MD        Results for orders placed during the hospital encounter of 03/14/13 (from the past 48 hour(s))  CBC WITH DIFFERENTIAL     Status: Abnormal   Collection Time    03/14/13 11:20 AM      Result Value Range   WBC 11.2 (*) 4.0 - 10.5 K/uL   RBC 4.00  3.87 - 5.11 MIL/uL   Hemoglobin 11.1 (*) 12.0 - 15.0 g/dL   HCT 16.1 (*) 09.6 - 04.5 %   MCV 87.5  78.0 - 100.0 fL   MCH 27.8  26.0 - 34.0 pg   MCHC 31.7  30.0 - 36.0 g/dL   RDW 40.9  81.1 - 91.4 %   Platelets 273  150 - 400 K/uL   Neutrophils Relative % 52  43 - 77 %   Neutro Abs 5.8  1.7 - 7.7 K/uL   Lymphocytes  Relative 35  12 - 46 %   Lymphs Abs 3.9  0.7 - 4.0 K/uL   Monocytes Relative 10  3 - 12 %   Monocytes Absolute 1.2 (*) 0.1 - 1.0 K/uL   Eosinophils Relative 3  0 - 5 %   Eosinophils Absolute 0.3  0.0 - 0.7 K/uL   Basophils Relative 0  0 - 1 %   Basophils Absolute 0.0  0.0 - 0.1 K/uL  BASIC METABOLIC PANEL     Status: Abnormal   Collection Time    03/14/13 11:20 AM      Result Value Range   Sodium 134 (*) 135 - 145 mEq/L   Potassium 2.8 (*) 3.5 - 5.1 mEq/L   Chloride 89 (*) 96 - 112 mEq/L   CO2 37 (*) 19 - 32 mEq/L   Glucose, Bld 132 (*) 70 - 99 mg/dL   BUN 8  6 - 23 mg/dL   Creatinine, Ser 7.82  0.50 - 1.10 mg/dL   Calcium 8.8  8.4 - 95.6 mg/dL   GFR calc non Af Amer >90  >90 mL/min   GFR calc Af Amer >90  >90 mL/min   Comment:            The eGFR has been calculated     using the CKD EPI equation.     This calculation has not been     validated in all clinical     situations.     eGFR's persistently     <90 mL/min signify     possible Chronic Kidney Disease.  PROTIME-INR     Status: Abnormal   Collection Time    03/14/13 11:20 AM      Result Value Range   Prothrombin Time 20.1 (*) 11.6 - 15.2 seconds   INR 1.77 (*) 0.00 - 1.49  SEDIMENTATION RATE     Status: Abnormal   Collection Time    03/14/13 11:20 AM      Result Value Range   Sed Rate 85 (*) 0 - 22 mm/hr    Review of Systems  Constitutional: Negative.   Eyes: Negative.   Respiratory: Positive for shortness of breath.   Cardiovascular: Negative.   Gastrointestinal: Positive for heartburn.  Genitourinary: Positive for urgency and frequency.  Musculoskeletal: Positive for back pain and joint pain.  Skin: Negative.   Neurological: Positive for headaches.  Endo/Heme/Allergies: Positive for environmental allergies.  Psychiatric/Behavioral: Negative.       Physical Exam  Constitutional: She is  oriented to person, place, and time and well-developed, well-nourished, and in no distress.  HENT:  Head:  Normocephalic and atraumatic.  Eyes: Pupils are equal, round, and reactive to light.  Neck: Neck supple. No JVD present. No tracheal deviation present. No thyromegaly present.  Cardiovascular: Normal rate, regular rhythm, normal heart sounds and intact distal pulses.   Pulmonary/Chest: Effort normal and breath sounds normal. No stridor. No respiratory distress. She has no wheezes.  Abdominal: Soft. There is no tenderness. There is no guarding.  Musculoskeletal:       Left knee: She exhibits decreased range of motion, swelling, laceration, erythema and bony tenderness. She exhibits no ecchymosis. Tenderness found.  Lymphadenopathy:    She has no cervical adenopathy.  Neurological: She is alert and oriented to person, place, and time.  Skin: Skin is warm and dry.  Psychiatric: Affect normal.      Assessment/Plan Assessment: Wound dehiscence of the left knee, S/P left knee patellofemoral arthroplasty  Plan: Patient will undergo an irrigation and debridement of the left knee on 03/14/2013 per Dr. Charlann Boxer at [facility]. Risks benefits and expectations were discussed with the patient. Patient understand risks, benefits and expectations and wishes to proceed.   Anastasio Auerbach Raphael Fitzpatrick   PAC  03/14/2013, 4:12 PM

## 2013-03-14 NOTE — Anesthesia Postprocedure Evaluation (Signed)
  Anesthesia Post-op Note  Patient: [name]  Procedure(s) Performed: Procedure(s) (LRB): IRRIGATION AND DEBRIDEMENT  and Closure of wound left KNEE (Left)  Patient Location: PACU  Anesthesia Type: General  Level of Consciousness: awake and alert   Airway and Oxygen Therapy: Patient Spontanous Breathing  Post-op Pain: mild  Post-op Assessment: Post-op Vital signs reviewed, Patient's Cardiovascular Status Stable, Respiratory Function Stable, Patent Airway and No signs of Nausea or vomiting  Last Vitals:  Filed Vitals:   03/14/13 2055  BP:   Pulse:   Temp: 36.8 C  Resp:     Post-op Vital Signs: stable   Complications: No apparent anesthesia complications

## 2013-03-14 NOTE — Progress Notes (Deleted)
Patient not able to activate my chart at this time.  Patient confused and no family with her.

## 2013-03-14 NOTE — Interval H&P Note (Signed)
History and Physical Interval Note:  03/14/2013 6:46 PM  [name]  has presented today for surgery, with the diagnosis of wound dehiscence left knee  The various methods of treatment have been discussed with the patient and family. After consideration of risks, benefits and other options for treatment, the patient has consented to  Procedure(s): IRRIGATION AND DEBRIDEMENT  and Closure of wound left KNEE (Left) as a surgical intervention .  The patient's history has been reviewed, patient examined, no change in status, stable for surgery.  I have reviewed the patient's chart and labs.  Questions were answered to the patient's satisfaction.     Mauri Pole

## 2013-03-14 NOTE — Transfer of Care (Signed)
Immediate Anesthesia Transfer of Care Note  Patient: [name]  Procedure(s) Performed: Procedure(s): IRRIGATION AND DEBRIDEMENT  and Closure of wound left KNEE (Left)  Patient Location: PACU  Anesthesia Type:General  Level of Consciousness: awake and sedated  Airway & Oxygen Therapy: Patient Spontanous Breathing and Patient connected to face mask oxygen  Post-op Assessment: Report given to PACU RN and Post -op Vital signs reviewed and stable  Post vital signs: Reviewed and stable  Complications: No apparent anesthesia complications

## 2013-03-14 NOTE — Brief Op Note (Signed)
03/14/2013  8:18 PM  PATIENT:  [name]  52 y.o. female  PRE-OPERATIVE DIAGNOSIS:  wound dehiscence left knee  POST-OPERATIVE DIAGNOSIS:  wound dehiscence of left knee  PROCEDURE:  Procedure(s): IRRIGATION AND DEBRIDEMENT  and Closure of wound left KNEE (Left) Excisional debridement left knee medial wound (not primary wound) Non-excisional debridement left knee wound  SURGEON:  Surgeon(s) and Role:    * Mauri Pole, MD - Primary  PHYSICIAN ASSISTANT: No  ANESTHESIA:   general  EBL:  Total I/O In: 650 [I.V.:650] Out: 39 [Urine:50]  BLOOD ADMINISTERED:none  DRAINS: (1 medium) Hemovact drain(s) in the left knee superficially with  Suction Open   LOCAL MEDICATIONS USED:  NONE  SPECIMEN:  No Specimen  DISPOSITION OF SPECIMEN:  N/A  COUNTS:  YES  TOURNIQUET:   Total Tourniquet Time Documented: Thigh (Left) - 22 minutes Total: Thigh (Left) - 22 minutes   DICTATION: .Other Dictation: Dictation Number [id number]  PLAN OF CARE: Admit to inpatient   PATIENT DISPOSITION:  PACU - hemodynamically stable.   Delay start of Pharmacological VTE agent (>24hrs) due to surgical blood loss or risk of bleeding: no

## 2013-03-14 NOTE — Anesthesia Preprocedure Evaluation (Addendum)
Anesthesia Evaluation  Patient identified by MRN, date of birth, ID band Patient awake    Reviewed: Allergy & Precautions, H&P , NPO status , Patient's Chart, lab work & pertinent test results  History of Anesthesia Complications (+) PONV  Airway Mallampati: II TM Distance: >3 FB Neck ROM: full    Dental  (+) Edentulous Upper and Edentulous Lower   Pulmonary shortness of breath, asthma , sleep apnea , COPD COPD inhaler, Current Smoker,  Hx. PE.  History of respiratory failure breath sounds clear to auscultation  Pulmonary exam normal       Cardiovascular Exercise Tolerance: Poor hypertension, + CAD Rhythm:regular Rate:Normal     Neuro/Psych negative neurological ROS  negative psych ROS   GI/Hepatic negative GI ROS, Neg liver ROS, GERD-  Medicated and Controlled,  Endo/Other  diabetes, Well Controlled, Type 2, Oral Hypoglycemic AgentsMorbid obesity  Renal/GU negative Renal ROS  negative genitourinary   Musculoskeletal   Abdominal (+) + obese,   Peds  Hematology negative hematology ROS (+)   Anesthesia Other Findings K dropped from 4.4 to 2.8 in 4 days but rechecked with i stat pre op and was 4.2  Reproductive/Obstetrics negative OB ROS                         Anesthesia Physical Anesthesia Plan  ASA: III  Anesthesia Plan: General   Post-op Pain Management:    Induction: Intravenous  Airway Management Planned: LMA  Additional Equipment:   Intra-op Plan:   Post-operative Plan:   Informed Consent: I have reviewed the patients History and Physical, chart, labs and discussed the procedure including the risks, benefits and alternatives for the proposed anesthesia with the patient or authorized representative who has indicated his/her understanding and acceptance.   Dental Advisory Given  Plan Discussed with: CRNA and Surgeon  Anesthesia Plan Comments:        Anesthesia Quick  Evaluation

## 2013-03-15 ENCOUNTER — Encounter (HOSPITAL_COMMUNITY): Payer: Self-pay | Admitting: Orthopedic Surgery

## 2013-03-15 LAB — BASIC METABOLIC PANEL
BUN: 5 mg/dL — ABNORMAL LOW (ref 6–23)
CO2: 42 mEq/L (ref 19–32)
Chloride: 89 mEq/L — ABNORMAL LOW (ref 96–112)
Creatinine, Ser: 0.55 mg/dL (ref 0.50–1.10)

## 2013-03-15 LAB — CBC
HCT: 36.8 % (ref 36.0–46.0)
MCV: 90 fL (ref 78.0–100.0)
RBC: 4.09 MIL/uL (ref 3.87–5.11)
WBC: 9 10*3/uL (ref 4.0–10.5)

## 2013-03-15 MED ORDER — POTASSIUM CHLORIDE IN NACL 40-0.9 MEQ/L-% IV SOLN
INTRAVENOUS | Status: DC
  Administered 2013-03-15 – 2013-03-19 (×3): via INTRAVENOUS
  Filled 2013-03-15 (×12): qty 1000

## 2013-03-15 MED ORDER — CEFAZOLIN SODIUM 1-5 GM-% IV SOLN
1.0000 g | Freq: Once | INTRAVENOUS | Status: DC
  Filled 2013-03-15: qty 50

## 2013-03-15 MED ORDER — CEFAZOLIN SODIUM-DEXTROSE 2-3 GM-% IV SOLR
2.0000 g | Freq: Once | INTRAVENOUS | Status: AC
  Administered 2013-03-15: 2 g via INTRAVENOUS
  Filled 2013-03-15: qty 50

## 2013-03-15 MED ORDER — OXYCODONE HCL ER 20 MG PO T12A
20.0000 mg | EXTENDED_RELEASE_TABLET | Freq: Two times a day (BID) | ORAL | Status: DC
  Administered 2013-03-15 – 2013-03-21 (×13): 20 mg via ORAL
  Filled 2013-03-15 (×14): qty 1

## 2013-03-15 MED ORDER — ASPIRIN 325 MG PO TABS
325.0000 mg | ORAL_TABLET | Freq: Two times a day (BID) | ORAL | Status: DC
  Administered 2013-03-16 – 2013-03-21 (×11): 325 mg via ORAL
  Filled 2013-03-15 (×12): qty 1

## 2013-03-15 NOTE — Evaluation (Addendum)
Physical Therapy Evaluation Patient Details Name: [name] MRN: [id number] DOB: 1961-01-09 Today's Date: 03/15/2013 Time: 4098-1191 PT Time Calculation (min): 39 min  PT Assessment / Plan / Recommendation History of Present Illness  52 yo woman admitted for L wound dehiscence, after fall, s/p patellofemoral arthroplasty 6/23. S/p excisional/non-excisional debridement L knee wound 6/30.   Clinical Impression  On eval, pt required Min guard-Min assist for mobility-able to ambulate ~150 feet with RW. C/o pain throughout session. Recommend HHPT with 24 hour supervision/assist at discharge.     PT Assessment  Patient needs continued PT services    Follow Up Recommendations  Home health PT; 24 hour supervision/assist    Does the patient have the potential to tolerate intense rehabilitation      Barriers to Discharge        Equipment Recommendations  None recommended by PT    Recommendations for Other Services OT consult   Frequency Min 5X/week    Precautions / Restrictions Precautions Precautions: Knee;Fall Restrictions Weight Bearing Restrictions: No LLE Weight Bearing: Weight bearing as tolerated   Pertinent Vitals/Pain 7/10 L knee "burning"      Mobility  Bed Mobility Bed Mobility: Supine to Sit Supine to Sit: 4: Min assist Transfers Transfers: Sit to Stand;Stand to Sit Sit to Stand: 4: Min guard;From bed Stand to Sit: 4: Min guard;To bed Details for Transfer Assistance: pt uses both hands to push up from surface.   Sat quickly but was not unsafe.  Ambulation/Gait Ambulation/Gait Assistance: 4: Min guard;4: Min Environmental consultant (Feet): 150 Feet Assistive device: Rolling walker Ambulation/Gait Assistance Details: VCS safety. Intermittent assistance for maneuvering walker. Slow gait speed. L foot/LE externally rotated.  Gait Pattern: Antalgic;Step-to pattern;Step-through pattern;Decreased stride length    Exercises     PT Diagnosis: Difficulty  walking;Abnormality of gait;Acute pain  PT Problem List: Decreased strength;Decreased range of motion;Decreased activity tolerance;Decreased mobility;Obesity;Pain;Decreased cognition PT Treatment Interventions: DME instruction;Gait training;Stair training;Functional mobility training;Therapeutic activities;Therapeutic exercise;Patient/family education     PT Goals(Current goals can be found in the care plan section) Acute Rehab PT Goals Patient Stated Goal: home.  PT Goal Formulation: With patient Time For Goal Achievement: 03/29/13 Potential to Achieve Goals: Good  Visit Information  Last PT Received On: 03/15/13 Assistance Needed: +1 History of Present Illness: 52 yo woman admitted for L wound dehiscence, after fall, s/p partial patellofemoral arthroplasty 6/23. S/p excisional/non-excisional debridement L knee wound.        Prior Functioning  Home Living Family/patient expects to be discharged to:: Private residence Living Arrangements: Spouse/significant other Type of Home: House Home Access: Stairs to enter Secretary/administrator of Steps: 6 Entrance Stairs-Rails: None Home Layout: One level Additional Comments: Pt states she lives with spouse who is disabled..  Husband has a messed up neck  Lives With: Spouse Prior Function Level of Independence: Needs assistance (socks/shoes) Communication Communication: No difficulties    Cognition  Cognition Arousal/Alertness: Awake/alert Behavior During Therapy: WFL for tasks assessed/performed Overall Cognitive Status: Impaired/Different from baseline Area of Impairment: Attention;Safety/judgement Current Attention Level: Sustained General Comments: pt very distractible.  Consistently ignored obstacles on L side.    Extremity/Trunk Assessment Upper Extremity Assessment Upper Extremity Assessment: Defer to OT evaluation Lower Extremity Assessment Lower Extremity Assessment: LLE deficits/detail RLE Deficits / Details: Strength  at least 3+/5 throughout LLE: Unable to fully assess due to pain   Balance    End of Session PT - End of Session Equipment Utilized During Treatment: Gait belt Activity Tolerance: Patient limited by  pain Patient left: in chair;with call bell/phone within reach  GP     [name], MPT Pager: [phone]

## 2013-03-15 NOTE — Progress Notes (Signed)
D: 51yo WF pt Dr. Charlann Boxer under Orthopaedic Services admitted 03/14/2013 for treatment of developing wound next to incision site from surgery on 03/07/2013 Lt patellofemoral knee arthroplasty.  Pt also with h/o obesity, gerd, htn, chronic pain, copd, smoker - 1ppd, PE in past, cad, asthma, sob, sleep apnea, migraines, depression, elevated cholesterol, dm, and previous back sx.  Pt now POD#1 s/p I&D of lt knee wound.  Pt oriented x4 and drowsy at times.  Ace wrap to lt knee c/d/i with hvx1 with serosang output.  Pt with O2 at 3L via [place] with sats >95%.  Cont pulse ox.  At 0612 received a call from the Lab to report critical values. A: Read back with Lab Tech to confirm CO2 level 42 and Potassium level 2.7.  Paged on call MD at 0615.  Rec'd a return call at 0617 from [facility].  Reviewed labs from this morning's draw.  Also reviewed yesterday's labs.  03/14/2013 @1120  CO2 = 37, and @1830  Potassium = 4.2.  Requested orders to change MIVF from NS @kvo  to NS+40KCl-@50ml /hr. Pt already taking KDur po BID. Pt is chronic CO2 retainer.  Will cont to monitor resp status.  R: Reviewed changes in POC with pt.  Orders sent to pharmacy to change MIVF.  Encouraged pt to use IS and do TCDB exercises.  Encouraged pt to sit up in bed.  Waiting on new MIVF to arrive.  Will cont to monitor pt status. Marrion Coy, RN

## 2013-03-15 NOTE — Progress Notes (Signed)
   Subjective: 1 Day Post-Op Procedure(s) (LRB): IRRIGATION AND DEBRIDEMENT  and Closure of wound left KNEE (Left)   Patient reports pain as moderate, pain constantly. She has chronic pain and sees pain management. Otherwise no events throughout the night.   Objective:   VITALS:   Filed Vitals:   03/15/13 0513  BP: 106/70  Pulse: 96  Temp: 98.2 F (36.8 C)  Resp: 16    Neurovascular intact Dorsiflexion/Plantar flexion intact Incision: dressing C/D/I No cellulitis present Compartment soft  LABS  Recent Labs  03/14/13 1120 03/14/13 1830 03/15/13 0505  HGB 11.1* 13.3 11.3*  HCT 35.0* 39.0 36.8  WBC 11.2*  --  9.0  PLT 273  --  240     Recent Labs  03/14/13 1120 03/14/13 1830 03/15/13 0505  NA 134* 137 136  K 2.8* 4.2 2.7*  BUN 8  --  5*  CREATININE 0.53  --  0.55  GLUCOSE 132* 130* 233*     Assessment/Plan: 1 Day Post-Op Procedure(s) (LRB): IRRIGATION AND DEBRIDEMENT  and Closure of wound left KNEE (Left) HV drain d/c'ed Foley cath d/c'ed Advance diet Up with therapy D/C IV fluids Discharge to SNF eventually when ready  Morbid Obesity (BMI >40)  Estimated body mass index is 44.24 kg/(m^2) as calculated from the following:   Height as of this encounter: 5\' 1"  (1.549 m).   Weight as of this encounter: 106.142 kg (234 lb). Patient also counseled that weight may inhibit the healing process Patient counseled that losing weight will help with future health issues  Hypokalemia Treated with oral potassium and will observe    Anastasio Auerbach. Milam Allbaugh   PAC  03/15/2013, 7:51 AM

## 2013-03-15 NOTE — Evaluation (Signed)
Occupational Therapy Evaluation Patient Details Name: [name] MRN: [id number] DOB: 1961/02/05 Today's Date: 03/15/2013 Time: 4098-1191 OT Time Calculation (min): 36 min  OT Assessment / Plan / Recommendation History of present illness             R TKA last week.                       Clinical Impression     This 52 year old female was admitted for R wound at knee (but not a TKA site), she is s/p I & D.  She had R TKA done last week.  She will benefit from continued OT to increase independence with adls.         OT Assessment  Patient needs continued OT Services    Follow Up Recommendations  Supervision/Assistance - 24 hour Home Health OT   Barriers to Discharge      Equipment Recommendations  None recommended by OT    Recommendations for Other Services    Frequency  Min 2X/week    Precautions / Restrictions Precautions Precautions: Fall;Knee Precaution Comments: TKA last week; does not need KI Restrictions Weight Bearing Restrictions: No Other Position/Activity Restrictions: WBAT   Pertinent Vitals/Pain R knee burning pain:  Repositioned and RN alerted.  Pt was premedicated    ADL  Grooming: Set up Where Assessed - Grooming: Unsupported sitting Upper Body Bathing: Set up Where Assessed - Upper Body Bathing: Unsupported sitting Lower Body Bathing: Minimal assistance Where Assessed - Lower Body Bathing: Supported sit to stand Upper Body Dressing: Minimal assistance (iv) Where Assessed - Upper Body Dressing: Unsupported sitting Lower Body Dressing: Moderate assistance Where Assessed - Lower Body Dressing: Supported sit to Pharmacist, hospital: Mining engineer Method: Sit to Barista:  (bed. ambulated, chair) Toileting - Clothing Manipulation and Hygiene: Supervision/safety Where Assessed - Toileting Clothing Manipulation and Hygiene: Standing Equipment Used: Rolling walker Transfers/Ambulation Related to ADLs:  ambulated out in hall--assistance with RW for turns and cues for obstacles on L ADL Comments: Pt stated husband assisted with shoes/socks but he couldn't do much due to neck problems.    OT Diagnosis: Generalized weakness  OT Problem List: Decreased strength;Decreased activity tolerance;Decreased knowledge of use of DME or AE;Pain OT Treatment Interventions: Self-care/ADL training;Therapeutic activities;DME and/or AE instruction;Patient/family education   OT Goals(Current goals can be found in the care plan section) Acute Rehab OT Goals Patient Stated Goal: none stated OT Goal Formulation: With patient Time For Goal Achievement: 03/22/13 Potential to Achieve Goals: Good ADL Goals Pt Will Perform Lower Body Bathing: with supervision;sit to/from stand;with adaptive equipment Pt Will Perform Lower Body Dressing: with supervision;sit to/from stand;with adaptive equipment (pants) Pt Will Transfer to Toilet: with supervision;ambulating;regular height toilet Additional ADL Goal #1: Pt will continue activities for 5 minutes without redirection  Visit Information  Last OT Received On: 03/15/13 Assistance Needed: +1 PT/OT Co-Evaluation/Treatment: Yes History of Present Illness: Pt had R TKA last week.  She is readmitted for I & D for wound next to knee       Prior Functioning     Home Living Family/patient expects to be discharged to:: Private residence Living Arrangements: Spouse/significant other Type of Home: House Home Access: Stairs to enter Secretary/administrator of Steps: 6 Entrance Stairs-Rails: None Home Layout: One level Additional Comments: Pt states she lives with spouse who is disabled..  Husband has a messed up neck  Lives With: Spouse Prior Function Level of  Independence: Needs assistance (socks/shoes) Communication Communication: No difficulties         Vision/Perception     Cognition  Cognition Arousal/Alertness: Awake/alert Behavior During Therapy: WFL  for tasks assessed/performed Overall Cognitive Status: Impaired/Different from baseline Area of Impairment: Attention;Safety/judgement Current Attention Level: Sustained General Comments: pt very distractible.  Consistently ignored obstacles on L side.    Extremity/Trunk Assessment Upper Extremity Assessment Upper Extremity Assessment: Overall WFL for tasks assessed     Mobility Bed Mobility Bed Mobility: Supine to Sit Supine to Sit: 4: Min assist;HOB elevated;With rails Transfers Sit to Stand: 4: Min guard Stand to Sit: 4: Min guard;With upper extremity assist;With armrests Details for Transfer Assistance: pt uses both hands to push up.  Sat quickly     Exercise     Balance     End of Session OT - End of Session Activity Tolerance: Patient tolerated treatment well Patient left: in chair;with call bell/phone within reach  GO     [name] 03/15/2013, 12:04 PM Marica Otter, OTR/L [phone] 03/15/2013

## 2013-03-15 NOTE — Progress Notes (Signed)
Utilization review completed.  

## 2013-03-15 NOTE — Op Note (Signed)
[name], [name]                  ACCOUNT NO.:  [id number]  MEDICAL RECORD NO.:  [id number]  LOCATION:  56                         FACILITY:  [facility]  PHYSICIAN:  Pietro Cassis. Alvan Dame, M.D.  DATE OF BIRTH:  [birth year]  DATE OF PROCEDURE:  03/14/2013 DATE OF DISCHARGE:                              OPERATIVE REPORT   PREOPERATIVE DIAGNOSIS:  Left wound dehiscence left knee status post partial patellofemoral arthroplasty. Please note this was not the primary incision, just a wound medially.  POSTOPERATIVE DIAGNOSIS:  Left wound dehiscence left knee status post partial patellofemoral arthroplasty.  Please note this was not the primary incision, just a wound medially.  FINDINGS:  There is no sign of infection in the knee and also that the capsule was completely entirely sealed with visible suture line still present.  PROCEDURE:  Excisional and non-excisional debridement of the left knee wound.  Excisional debridement was of a small portion of medial side of the incision about an inch in length sharply with a scalpel including skin and subcutaneous tissue.  In addition, I had sharply excised with a scalpel the incision removing some old sutures.  Following this, I did a non-excisional debridement with 3 L of normal saline solution pulse lavage.  This was followed by primary wound closure of both wounds.  SURGEON:  Pietro Cassis. Alvan Dame, M.D.  ASSISTANT:  Surgical team.  ANESTHESIA:  General.  SPECIMENS:  None.  COMPLICATIONS:  None.  DRAINS:  One medium Hemovac placed in the superficial layers of the knee.  TOURNIQUET TIME:  Per anesthetic record at 21 minutes at 250 mmHg.  INDICATION FOR PROCEDURE:  Ms. Keeny is a 52 year old female, recently status post a left patellofemoral arthroplasty.  She presented to the office today with concerns of wound drainage.  She was seen and evaluated in the office and sent over to the hospital for admission. Her primary incision was completely  healed.  She had minimal erythema. She had an area apparently identified at the time of closure medial to the incision that was reapproximated using Dermabond.  This is the area that had dehisced.  It was just drain hematoma or seroma fluid.  She was seen, evaluated, and recommended to go the operating room to close this wound predominantly.  Risks and benefits reviewed.  Benefits discussed.  Consent was obtained.  PROCEDURE IN DETAIL:  The patient was brought to the operative theater. Once adequate anesthesia, preoperative antibiotics, 2 g of Ancef administered.  She was positioned supine.  A left thigh tourniquet placed.  Left lower extremity was then prepped and draped in a sterile fashion.  Time-out was performed identifying the patient, planned procedure, and extremity.  The leg was exsanguinated, tourniquet elevated to 250 mmHg.  I first incised the patient's primary incision encountering and then closed the capsule with no purulent, old hematoma present, but minimal. I then excised in elliptical fashion this side medial to the incision distally only about an inch using a scalpel in the skin and subcutaneous tissue.  Following this exposure, I irrigated the wound with 3 L of normal saline solution removing old sutures as we did not debride any potential nonviable tissues.  Following this irrigation, I selected to reapproximate this medial incision with 3-0 Vicryl followed by 2-0 nylon in horizontal interrupted fashion.  I then reapproximated the primary incision with 2-0 Vicryl and a running 3-0 Monocryl.  I did place a medium Hemovac drain into the superficial layers to help prevent any further fluid accumulation in this area.  I reapproximated the primary incision with Dermabond and an Aquacel dressing, and the medial incision with Xeroform and a Tegaderm and gauze.  Following this, she was extubated, brought to the recovery room in stable condition tolerating the  procedure well.  We will plan to keep her in the hospital for a couple days to watch the wound and progress with physical therapy.     Pietro Cassis Alvan Dame, M.D.     MDO/MEDQ  D:  03/14/2013  T:  03/15/2013  Job:  [id number]

## 2013-03-16 DIAGNOSIS — E876 Hypokalemia: Secondary | ICD-10-CM

## 2013-03-16 DIAGNOSIS — T8131XA Disruption of external operation (surgical) wound, not elsewhere classified, initial encounter: Secondary | ICD-10-CM

## 2013-03-16 HISTORY — DX: Disruption of external operation (surgical) wound, not elsewhere classified, initial encounter: T81.31XA

## 2013-03-16 LAB — BASIC METABOLIC PANEL
BUN: 4 mg/dL — ABNORMAL LOW (ref 6–23)
CO2: >45 mEq/L (ref 19–32)
Calcium: 7.2 mg/dL — ABNORMAL LOW (ref 8.4–10.5)
GFR calc non Af Amer: >90 mL/min (ref >90)
Glucose, Bld: 222 mg/dL — ABNORMAL HIGH (ref 70–99)

## 2013-03-16 LAB — GLUCOSE, CAPILLARY

## 2013-03-16 LAB — CBC
HCT: 36.1 % (ref 36.0–46.0)
Hemoglobin: 11.1 g/dL — ABNORMAL LOW (ref 12.0–15.0)
MCH: 28 pg (ref 26.0–34.0)
MCHC: 30.7 g/dL (ref 30.0–36.0)
RBC: 3.96 MIL/uL (ref 3.87–5.11)

## 2013-03-16 MED ORDER — INSULIN ASPART 100 UNIT/ML ~~LOC~~ SOLN
0.0000 [IU] | Freq: Three times a day (TID) | SUBCUTANEOUS | Status: DC
  Administered 2013-03-16: 3 [IU] via SUBCUTANEOUS
  Administered 2013-03-16 – 2013-03-17 (×4): 2 [IU] via SUBCUTANEOUS
  Administered 2013-03-18: 1 [IU] via SUBCUTANEOUS
  Administered 2013-03-18 – 2013-03-19 (×3): 2 [IU] via SUBCUTANEOUS
  Administered 2013-03-19: 1 [IU] via SUBCUTANEOUS
  Administered 2013-03-20 – 2013-03-21 (×3): 2 [IU] via SUBCUTANEOUS

## 2013-03-16 MED ORDER — HYDROMORPHONE HCL PF 1 MG/ML IJ SOLN
0.5000 mg | INTRAMUSCULAR | Status: DC | PRN
  Administered 2013-03-16: 0.5 mg via INTRAVENOUS
  Filled 2013-03-16: qty 1

## 2013-03-16 NOTE — Progress Notes (Signed)
Inpatient Diabetes Program Recommendations  AACE/ADA: New Consensus Statement on Inpatient Glycemic Control (2013)  Target Ranges:  Prepandial:   less than 140 mg/dL      Peak postprandial:   less than 180 mg/dL (1-2 hours)      Critically ill patients:  140 - 180 mg/dL   Reason for Visit: Hyperglycemia  Results for [name], [name] (MRN [id number]) as of 03/16/2013 13:46  Ref. Range 03/16/2013 11:33  Glucose-Capillary Latest Range: 70-99 mg/dL 409 (H)  Results for [name], [name] (MRN [id number]) as of 03/16/2013 13:46  Ref. Range 03/15/2013 05:05  Sodium Latest Range: 135-145 mEq/L 136  Potassium Latest Range: 3.5-5.1 mEq/L 2.7 (LL)  Chloride Latest Range: 96-112 mEq/L 89 (L)  CO2 Latest Range: 19-32 mEq/L 42 (HH)  BUN Latest Range: 6-23 mg/dL 5 (L)  Creatinine Latest Range: 0.50-1.10 mg/dL 9.56  Calcium Latest Range: 8.4-10.5 mg/dL 7.7 (L)  GFR calc non Af Amer Latest Range: >90 mL/min >90  GFR calc Af Amer Latest Range: >90 mL/min >90  Glucose Latest Range: 70-99 mg/dL 213 (H)    Inpatient Diabetes Program Recommendations Insulin - Meal Coverage: Add meal coverage insulin - Novolog 3 units tidwc if pt eats >50% meal HgbA1C: HgbA1C to assess glycemic control prior to hospitalization  Note: Will follow.  Thank you. Ailene Ards, RD, LDN, CDE Inpatient Diabetes Coordinator [phone]

## 2013-03-16 NOTE — Progress Notes (Signed)
Physical Therapy Treatment Patient Details Name: [name] MRN: [id number] DOB: March 17, 1961 Today's Date: 03/16/2013 Time: 7829-5621 PT Time Calculation (min): 30 min  PT Assessment / Plan / Recommendation  PT Comments   Pt more alert during this pm session. Still demonstrates some safety awareness issues. Per chart, pt and husband requesting/agreeable to SNF for rehab.   Follow Up Recommendations  SNF (pt and husband requesting). If pt does not d/c to SNF, recommend HHPT with 24 hour care.      Does the patient have the potential to tolerate intense rehabilitation     Barriers to Discharge        Equipment Recommendations  None recommended by PT    Recommendations for Other Services OT consult  Frequency Min 5X/week   Progress towards PT Goals Progress towards PT goals: Progressing toward goals  Plan Discharge plan needs to be updated    Precautions / Restrictions Precautions Precautions: Fall Restrictions Weight Bearing Restrictions: No LLE Weight Bearing: Weight bearing as tolerated   Pertinent Vitals/Pain 7/10 L knee with activity    Mobility  Bed Mobility Bed Mobility: Not assessed Details for Bed Mobility Assistance: pt standing with RN Transfers Transfers: Sit to Stand;Stand to Sit Sit to Stand: 4: Min guard;From chair/3-in-1;With armrests Stand to Sit: 4: Min guard;To chair/3-in-1;With armrests Details for Transfer Assistance: VCS safety, technique, hand placement.  Ambulation/Gait Ambulation/Gait Assistance: 4: Min assist;4: Min Government social research officer (Feet): 125 Feet Assistive device: Rolling walker Ambulation/Gait Assistance Details: VCS safety, attention to task, environment. slow gait speed. Intermittent assist to maneuver RW Gait Pattern: Step-through pattern;Decreased stride length;Antalgic;Trunk flexed    Exercises     PT Diagnosis:    PT Problem List:   PT Treatment Interventions:     PT Goals (current goals can now be found in the care  plan section) Acute Rehab PT Goals Patient Stated Goal: home.   Visit Information  Last PT Received On: 03/16/13 Assistance Needed: +1    Subjective Data  Patient Stated Goal: home.    Cognition  Cognition Arousal/Alertness: Awake/alert Behavior During Therapy: WFL for tasks assessed/performed Overall Cognitive Status: Impaired/Different from baseline Area of Impairment: Safety/judgement Safety/Judgement: Decreased awareness of safety General Comments: pt very distractible.  Consistently ignored obstacles on L side.    Balance     End of Session PT - End of Session Equipment Utilized During Treatment: Oxygen Activity Tolerance: Patient limited by pain Patient left: in chair;with call bell/phone within reach   GP     [name], MPT Pager: [phone]

## 2013-03-16 NOTE — Progress Notes (Signed)
Physical Therapy Treatment Patient Details Name: [name] MRN: [id number] DOB: 1960/10/24 Today's Date: 03/16/2013 Time: 3244-0102 PT Time Calculation (min): 31 min  PT Assessment / Plan / Recommendation  PT Comments   Pt appeared more lethargic this session compared to last. Mobility level still Min guard to Min assist. Deferred further ambulation for safety reasons-pt having trouble keeping her eyes open while walking from bathroom. Will plan to see for 2nd session later today.   Follow Up Recommendations  Home health PT;Supervision/Assistance - 24 hour     Does the patient have the potential to tolerate intense rehabilitation     Barriers to Discharge        Equipment Recommendations  None recommended by PT    Recommendations for Other Services OT consult  Frequency Min 5X/week   Progress towards PT Goals Progress towards PT goals: Progressing toward goals  Plan Current plan remains appropriate    Precautions / Restrictions Precautions Precautions: Fall Restrictions Weight Bearing Restrictions: No LLE Weight Bearing: Weight bearing as tolerated   Pertinent Vitals/Pain 8/10 L knee     Mobility  Bed Mobility Bed Mobility: Not assessed Details for Bed Mobility Assistance: pt sitting in recliner Transfers Transfers: Sit to Stand;Stand to Sit Sit to Stand: 4: Min guard;From chair/3-in-1;With armrests Stand to Sit: 4: Min guard;To chair/3-in-1;With armrests Details for Transfer Assistance: VCS safety, technique, hand placement.  Ambulation/Gait Ambulation/Gait Assistance: 4: Min assist;4: Min guard Ambulation Distance (Feet): 30 Feet (15'x2) Assistive device: Rolling walker Ambulation/Gait Assistance Details: VCs safety, attention to task. Slow gait speed. Intermittent assist to maneuver with RW. Ambulation in room only.  Gait Pattern: Step-through pattern;Decreased stride length;Antalgic;Trunk flexed    Exercises     PT Diagnosis:    PT Problem List:   PT  Treatment Interventions:     PT Goals (current goals can now be found in the care plan section)    Visit Information  Last PT Received On: 03/16/13 Assistance Needed: +1    Subjective Data      Cognition  Cognition Arousal/Alertness: Suspect due to medications;Lethargic Behavior During Therapy: WFL for tasks assessed/performed Overall Cognitive Status: Impaired/Different from baseline Area of Impairment: Attention;Safety/judgement;Memory Current Attention Level: Sustained General Comments: pt very distractible.  Consistently ignored obstacles on L side.    Balance     End of Session PT - End of Session Equipment Utilized During Treatment: Oxygen Activity Tolerance: Patient limited by lethargy;Patient limited by pain Patient left: in chair;with call bell/phone within reach   GP     [name], MPT Pager: [phone]

## 2013-03-16 NOTE — Progress Notes (Signed)
Clinical Social Work Department BRIEF PSYCHOSOCIAL ASSESSMENT 03/16/2013  Patient:  [name], [name]     Account Number:  [id number]     Admit date:  03/14/2013  Clinical Social Worker:  Candie Chroman  Date/Time:  03/16/2013 12:29 PM  Referred by:  Physician  Date Referred:  03/16/2013 Referred for  SNF Placement   Other Referral:   Interview type:  Patient Other interview type:    PSYCHOSOCIAL DATA Living Status:  HUSBAND Admitted from facility:   Level of care:   Primary support name:  Dagoberto Reef Primary support relationship to patient:  SPOUSE Degree of support available:   Denton Meek is disabled.    CURRENT CONCERNS Current Concerns  Post-Acute Placement   Other Concerns:    SOCIAL WORK ASSESSMENT / PLAN Pt is a 52 yr old female  living at home prior to hospitalization. CSW met with pt and spoke to spouse regarding d/c planning. ST SNF placement is needed following hospital d/c. Pt / spouse are in agreement with this plan. SNF search has been initiated and bed offers provided. CSW will meet with pt / spouse again this afternoon to continue assisting with SNF placement.   Assessment/plan status:  Psychosocial Support/Ongoing Assessment of Needs Other assessment/ plan:   Information/referral to community resources:   SNF list provided. Insurance coverage for SNF placement reviewed.    PATIENT'S/FAMILY'S RESPONSE TO PLAN OF CARE: Both pt and spouse feel ST Rehab is needed.    Cori Razor LCSW [phone]

## 2013-03-16 NOTE — Progress Notes (Signed)
Clinical Social Work Department CLINICAL SOCIAL WORK PLACEMENT NOTE 03/16/2013  Patient:  [name], [name]  Account Number:  [id number] Admit date:  03/14/2013  Clinical Social Worker:  Cori Razor, LCSW  Date/time:  03/16/2013 12:36 PM  Clinical Social Work is seeking post-discharge placement for this patient at the following level of care:   SKILLED NURSING   (*CSW will update this form in Epic as items are completed)   03/16/2013  Patient/family provided with [facility] Department of Clinical Social Work's list of facilities offering this level of care within the geographic area requested by the patient (or if unable, by the patient's family).  03/16/2013  Patient/family informed of their freedom to choose among providers that offer the needed level of care, that participate in Medicare, Medicaid or managed care program needed by the patient, have an available bed and are willing to accept the patient.  03/16/2013  Patient/family informed of [facility]' ownership interest in [facility], as well as of the fact that they are under no obligation to receive care at this facility.  PASARR submitted to EDS on  PASARR number received from EDS on 03/07/2013  FL2 transmitted to all facilities in geographic area requested by pt/family on  03/16/2013 FL2 transmitted to all facilities within larger geographic area on   Patient informed that his/her managed care company has contracts with or will negotiate with  certain facilities, including the following:     Patient/family informed of bed offers received:  03/16/2013 Patient chooses bed at  Physician recommends and patient chooses bed at    Patient to be transferred to  on   Patient to be transferred to facility by   The following physician request were entered in Epic:   Additional Comments:

## 2013-03-16 NOTE — Progress Notes (Signed)
OT Cancellation Note  Patient Details Name: AMANDALEE LACAP MRN: [id number] DOB: 03/05/1961   Cancelled Treatment:    Reason Eval/Treat Not Completed: Fatigue/lethargy limiting ability to participate  Jezreel Sisk 03/16/2013, 2:02 PM Marica Otter, OTR/L [phone] 03/16/2013

## 2013-03-16 NOTE — Progress Notes (Signed)
CSW met with pt/spouse this afternoon to assist with d/c planning. Pt/spouse have chosen [facility] for [facility]. SNF bed will be available Thurs. If pt is ready for d/c.  Cori Razor LCSW [phone]

## 2013-03-16 NOTE — Progress Notes (Signed)
   Subjective: 2 Days Post-Op Procedure(s) (LRB): IRRIGATION AND DEBRIDEMENT  and Closure of wound left KNEE (Left)   Patient reports pain as moderate, She keeps asking for Dilaudid, but keeps falling asleep and really drowsy. No other events throughout the night. She is told that she needs to try not to take any IV pain medication and to stick with just orals. She knows that she can't be d/c from the hospital on IV medication. She continues to state that she still wants some. Eventually she stopped asking. She will probably benefit from SNF place for added assistance.   Objective:   VITALS:   Filed Vitals:   03/16/13 0455  BP: 101/70  Pulse: 94  Temp: 98.7 F (37.1 C)  Resp: 16    Neurovascular intact Dorsiflexion/Plantar flexion intact Incision: dressing C/D/I No cellulitis present Compartment soft  LABS  Recent Labs  03/14/13 1120 03/14/13 1830 03/15/13 0505  HGB 11.1* 13.3 11.3*  HCT 35.0* 39.0 36.8  WBC 11.2*  --  9.0  PLT 273  --  240     Recent Labs  03/14/13 1120 03/14/13 1830 03/15/13 0505  NA 134* 137 136  K 2.8* 4.2 2.7*  BUN 8  --  5*  CREATININE 0.53  --  0.55  GLUCOSE 132* 130* 233*     Assessment/Plan: 2 Days Post-Op Procedure(s) (LRB): IRRIGATION AND DEBRIDEMENT  and Closure of wound left KNEE (Left) Up with therapy Discharge to SNF eventually when ready. Will obtain another CBC and BMET.  Morbid Obesity (BMI >40)  Estimated body mass index is 44.24 kg/(m^2) as calculated from the following:   Height as of this encounter: 5\' 1"  (1.549 m).   Weight as of this encounter: 106.142 kg (234 lb). Patient also counseled that weight may inhibit the healing process Patient counseled that losing weight will help with future health issues  Hypokalemia Treated with oral potassium and will observe     Anastasio Auerbach. Likisha Alles   PAC  03/16/2013, 11:56 AM

## 2013-03-17 DIAGNOSIS — Z5189 Encounter for other specified aftercare: Secondary | ICD-10-CM

## 2013-03-17 DIAGNOSIS — F17200 Nicotine dependence, unspecified, uncomplicated: Secondary | ICD-10-CM

## 2013-03-17 DIAGNOSIS — G8929 Other chronic pain: Secondary | ICD-10-CM

## 2013-03-17 DIAGNOSIS — E872 Acidosis: Secondary | ICD-10-CM

## 2013-03-17 LAB — BLOOD GAS, ARTERIAL
Bicarbonate: 45.3 mEq/L — ABNORMAL HIGH (ref 20.0–24.0)
Patient temperature: 37
TCO2: 41.7 mmol/L (ref 0–100)
pCO2 arterial: 73.1 mmHg (ref 35.0–45.0)
pH, Arterial: 7.409 (ref 7.350–7.450)

## 2013-03-17 LAB — MAGNESIUM: Magnesium: 0.9 mg/dL — CL (ref 1.5–2.5)

## 2013-03-17 LAB — GLUCOSE, CAPILLARY
Glucose-Capillary: 162 mg/dL — ABNORMAL HIGH (ref 70–99)
Glucose-Capillary: 165 mg/dL — ABNORMAL HIGH (ref 70–99)

## 2013-03-17 MED ORDER — KETOROLAC TROMETHAMINE 15 MG/ML IJ SOLN
15.0000 mg | Freq: Four times a day (QID) | INTRAMUSCULAR | Status: AC
  Administered 2013-03-17 – 2013-03-19 (×8): 15 mg via INTRAVENOUS
  Filled 2013-03-17 (×9): qty 1

## 2013-03-17 MED ORDER — MAGNESIUM SULFATE 40 MG/ML IJ SOLN
2.0000 g | Freq: Once | INTRAMUSCULAR | Status: AC
  Administered 2013-03-17: 2 g via INTRAVENOUS
  Filled 2013-03-17: qty 50

## 2013-03-17 MED ORDER — AMOXICILLIN-POT CLAVULANATE 875-125 MG PO TABS
1.0000 | ORAL_TABLET | Freq: Two times a day (BID) | ORAL | Status: DC
  Administered 2013-03-17 – 2013-03-21 (×9): 1 via ORAL
  Filled 2013-03-17 (×10): qty 1

## 2013-03-17 MED ORDER — POTASSIUM CHLORIDE CRYS ER 20 MEQ PO TBCR
40.0000 meq | EXTENDED_RELEASE_TABLET | ORAL | Status: AC
  Administered 2013-03-17 – 2013-03-18 (×4): 40 meq via ORAL
  Filled 2013-03-17 (×4): qty 2

## 2013-03-17 NOTE — Progress Notes (Signed)
PT NOTE  Attempted 2nd PT session x 2 this afternoon-pt eating lunch on both attempts. Will check back tomorrow-pt agreeable to this. Thanks. [name], PT [phone]

## 2013-03-17 NOTE — Care Management Note (Signed)
    Page 1 of 1   03/17/2013     3:09:36 PM   CARE MANAGEMENT NOTE 03/17/2013  Patient:  [name], [name]   Account Number:  [id number]  Date Initiated:  03/17/2013  Documentation initiated by:  Colleen Can  Subjective/Objective Assessment:   DX WOUND DEHISCENCE LEFT KNEE; excisional and non excisional debridemnet wound     Action/Plan:   SNF rehab   Anticipated DC Date:  03/20/2013   Anticipated DC Plan:  SKILLED NURSING FACILITY  In-house referral  Clinical Social Worker      DC Planning Services  CM consult      Choice offered to / List presented to:             Status of service:  Completed, signed off Medicare Important Message given?   (If response is "NO", the following Medicare IM given date fields will be blank) Date Medicare IM given:   Date Additional Medicare IM given:    Discharge Disposition:    Per UR Regulation:  Reviewed for med. necessity/level of care/duration of stay  If discussed at Long Length of Stay Meetings, dates discussed:    Comments:

## 2013-03-17 NOTE — Progress Notes (Signed)
Physical Therapy Treatment Patient Details Name: [name] MRN: [id number] DOB: May 16, 1961 Today's Date: 03/17/2013 Time: 9147-8295 PT Time Calculation (min): 28 min  PT Assessment / Plan / Recommendation  PT Comments     Follow Up Recommendations  SNF     Does the patient have the potential to tolerate intense rehabilitation     Barriers to Discharge        Equipment Recommendations  None recommended by PT    Recommendations for Other Services OT consult  Frequency Min 5X/week   Progress towards PT Goals Progress towards PT goals: Progressing toward goals  Plan Current plan remains appropriate    Precautions / Restrictions Precautions Precautions: Fall Restrictions Weight Bearing Restrictions: No LLE Weight Bearing: Weight bearing as tolerated   Pertinent Vitals/Pain L knee 7/10    Mobility  Bed Mobility Bed Mobility: Sit to Supine Supine to Sit: 4: Min assist Sit to Supine: 4: Min assist Details for Bed Mobility Assistance: Assist for LEs onto bed.  Transfers Transfers: Sit to Stand;Stand to Sit Sit to Stand: 4: Min assist;From chair/3-in-1;From toilet Stand to Sit: 4: Min guard;To bed;To toilet Details for Transfer Assistance: Assist to rise, stabilize. LOB posteriorly with initial standing.  Ambulation/Gait Ambulation/Gait Assistance: 4: Min assist Ambulation Distance (Feet): 135 Feet Assistive device: Rolling walker Ambulation/Gait Assistance Details: Assist to stabilize and maneuver with RW intermittently. slow gait speed.  Gait Pattern: Step-through pattern;Decreased stride length;Trunk flexed    Exercises     PT Diagnosis:    PT Problem List:   PT Treatment Interventions:     PT Goals (current goals can now be found in the care plan section)    Visit Information  Last PT Received On: 03/17/13 Assistance Needed: +1 History of Present Illness: 52 yo woman admitted for L wound dehiscence, after fall, s/p partial patellofemoral arthroplasty 6/23.  S/p excisional/non-excisional debridement L knee wound.     Subjective Data      Cognition  Cognition Arousal/Alertness: Awake/Wiggins Behavior During Therapy: WFL for tasks assessed/performed Overall Cognitive Status: Impaired/Different from baseline Area of Impairment: Safety/judgement Safety/Judgement: Decreased awareness of safety    Balance     End of Session PT - End of Session Equipment Utilized During Treatment: Gait belt Activity Tolerance: Patient limited by pain Patient left: with call bell/phone within reach   GP     [name], MPT Pager: [phone]

## 2013-03-17 NOTE — Progress Notes (Signed)
Clinical Social Work  Per progression meeting, patient will DC to SNF on Monday. CSW called SNF and updated facility on DC plans. SNF agreeable to accept patient when medically stable.  Unk Lightning, LCSW  (Coverage for [employer])

## 2013-03-17 NOTE — Progress Notes (Signed)
   Subjective: 3 Days Post-Op Procedure(s) (LRB): IRRIGATION AND DEBRIDEMENT  and Closure of wound left KNEE (Left)   Patient reports pain as moderate, states that she is having a lot of pain. Dr. Charlann Boxer had a serious conversation with the patient regarding her overuse of pain medications. He expressed how he wants her to get her life back and not to be so dependent on the analgesic medications. He discussed that we will make changes to her medications, to control pain, but not knock her out.   Objective:   VITALS:   Filed Vitals:   03/17/13 0500  BP: 115/78  Pulse: 92  Temp: 98.8 F (37.1 C)  Resp: 16    Neurovascular intact Dorsiflexion/Plantar flexion intact Incision: dressing C/D/I  LABS  Recent Labs  03/14/13 1120 03/14/13 1830 03/15/13 0505 03/16/13 1603  HGB 11.1* 13.3 11.3* 11.1*  HCT 35.0* 39.0 36.8 36.1  WBC 11.2*  --  9.0 11.3*  PLT 273  --  240 223     Recent Labs  03/14/13 1120 03/14/13 1830 03/15/13 0505 03/16/13 1603  NA 134* 137 136 136  K 2.8* 4.2 2.7* 2.6*  BUN 8  --  5* 4*  CREATININE 0.53  --  0.55 0.50  GLUCOSE 132* 130* 233* 222*     Assessment/Plan: 3 Days Post-Op Procedure(s) (LRB): IRRIGATION AND DEBRIDEMENT  and Closure of wound left KNEE (Left) Up with therapy Discharge to SNF eventually, when ready Will be here through the weekend to continue watching the right knee wound.  Dressing changed today with Xeroform, guaze and tape. Will change daily. Toradol added to help control pain Augmentin added to prophylactic treat drainage of the right knee D/c'ed Dilaudid   Hypokalemia Have ordered a medicine consult Dr. Charlann Boxer has discussed with Hospitalist  Hypercapnia Have ordered a medicine consult Dr. Charlann Boxer has discussed with Hospitalist     Anastasio Auerbach. Stony Stegmann   PAC  03/17/2013, 9:38 AM

## 2013-03-17 NOTE — Consult Note (Addendum)
Requesting physician: Dr. Adriana Mccallum  Primary Care Physician: Shirline Frees, MD  Reason for consultation: Electrolyte disturbances   History of Present Illness: 52 y/o white woman admitted to the hospital on 03/14/13 complaining of wound dehiscence of the left knee after having a patellofemoral knee arthroplasty on 03/07/2013. Due to some concerns of the opening and some drainage she was admitted to the hospital for an irrigation and debridement of the left knee. Due to her previous arthroplasty it is decided to wash the knee out to help prevent possible infection. She has chronic pain syndrome and her pain management has been an issue. We have been asked to see her today due to persistent hypokalemia and a bicarb >45 on her BMET. She seems quite somnolent during our conversation and dozes off on occasion.    Allergies:   Allergies  Allergen Reactions  . Amoxicillin Nausea Only    6/14- states has taken Amoxicillin and no reaction.   . Clindamycin/Lincomycin Nausea And Vomiting  . Doxycycline Nausea And Vomiting and Nausea Only  . Treximet (Sumatriptan-Naproxen Sodium) Nausea And Vomiting and Nausea Only  . Bactrim Hives and Rash      Past Medical History  Diagnosis Date  . Obesity   . Hypertension   . GERD (gastroesophageal reflux disease)   . Chronic pain   . Pulmonary embolism   . COPD (chronic obstructive pulmonary disease)   . Tobacco abuse   . CAD (coronary artery disease)   . Spondylolisthesis, grade 2   . Deviated septum   . Perforated bowel   . Diabetes mellitus without complication   . Hypercholesterolemia   . PONV (postoperative nausea and vomiting)   . Depression   . Shortness of breath   . Asthma   . Headache(784.0)     migraines  . Arthritis   . Sleep apnea     Past Surgical History  Procedure Laterality Date  . Abdominal hysterectomy    . Colon surgery    . Back surgery      lumbar  . Patella-femoral arthroplasty Left 03/07/2013    Procedure:  LEFT PATELLA-FEMORAL ARTHROPLASTY;  Surgeon: Mauri Pole, MD;  Location: [facility];  Service: Orthopedics;  Laterality: Left;  . Irrigation and debridement knee Left 03/14/2013    Procedure: IRRIGATION AND DEBRIDEMENT  and Closure of wound left KNEE;  Surgeon: Mauri Pole, MD;  Location: [facility];  Service: Orthopedics;  Laterality: Left;    Scheduled Meds: . alprazolam  1 mg Oral Daily  . ALPRAZolam  2 mg Oral QHS  . amoxicillin-clavulanate  1 tablet Oral Q12H  . aspirin  325 mg Oral BID  . bumetanide  2 mg Oral BID  . cyclobenzaprine  10 mg Oral TID  . docusate sodium  100 mg Oral BID  . escitalopram  10 mg Oral QHS  . estradiol  2 mg Oral Daily  . ferrous sulfate  325 mg Oral TID PC  . insulin aspart  0-9 Units Subcutaneous TID WC  . ketorolac  15 mg Intravenous Q6H  . metFORMIN  500 mg Oral BID WC  . OxyCODONE  20 mg Oral Q12H  . pantoprazole  80 mg Oral Daily  . polyethylene glycol  17 g Oral BID  . potassium chloride  30 mEq Oral BID  . potassium chloride  40 mEq Oral Q4H  . simvastatin  20 mg Oral QHS   Continuous Infusions: . 0.9 % NaCl with KCl 40 mEq / L Stopped (03/16/13 1400)  PRN Meds:.acetaminophen, acetaminophen, albuterol, fluticasone, lidocaine, menthol-cetylpyridinium, metoCLOPramide (REGLAN) injection, metoCLOPramide, metolazone, nitroGLYCERIN, nystatin, olopatadine, ondansetron (ZOFRAN) IV, ondansetron, oxyCODONE, phenol, polyethylene glycol, promethazine, simethicone  Social History:  reports that she has been smoking Cigarettes.  She has been smoking about 1.00 pack per day. She has never used smokeless tobacco. She reports that she does not drink alcohol or use illicit drugs.  History reviewed. No pertinent family history.  Review of Systems:  Constitutional: Denies fever, chills, diaphoresis, appetite change and fatigue.  HEENT: Denies photophobia, eye pain, redness, hearing loss, ear pain, congestion, sore throat, rhinorrhea, sneezing, mouth sores,  trouble swallowing, neck pain, neck stiffness and tinnitus.   Respiratory: Denies SOB, DOE, cough, chest tightness,  and wheezing.   Cardiovascular: Denies chest pain, palpitations and leg swelling.  Gastrointestinal: Denies nausea, vomiting, abdominal pain, diarrhea, constipation, blood in stool and abdominal distention.  Genitourinary: Denies dysuria, urgency, frequency, hematuria, flank pain and difficulty urinating.  Endocrine: Denies: hot or cold intolerance, sweats, changes in hair or nails, polyuria, polydipsia. Musculoskeletal: Denies myalgias, back pain, joint swelling, arthralgias and gait problem.  Skin: Denies pallor, rash and wound.  Neurological: Denies dizziness, seizures, syncope, weakness, light-headedness, numbness and headaches.  Hematological: Denies adenopathy. Easy bruising, personal or family bleeding history  Psychiatric/Behavioral: Denies suicidal ideation, mood changes, confusion, nervousness, sleep disturbance and agitation   Physical Exam: Blood pressure 115/78, pulse 92, temperature 98.8 F (37.1 C), temperature source Oral, resp. rate 16, height 5' 1"  (1.549 m), weight 106.142 kg (234 lb), SpO2 92.00%. Gen: Obese, somnolent. HEENT: [place]/AT Neck: supple, no JVD, no LAD, no bruits, no goiter. CV: RRR Lungs: CTA B, fair inspiratory effort. Abd: obese/S/NT/ND/+BS/no masses or organomegaly noted. Ext: trace edema Neuro: non-focal, somnolent  Labs on Admission:  Results for orders placed during the hospital encounter of 03/14/13 (from the past 48 hour(s))  GLUCOSE, CAPILLARY     Status: Abnormal   Collection Time    03/16/13 11:33 AM      Result Value Range   Glucose-Capillary 216 (*) 70 - 99 mg/dL   Comment 1 Notify RN     Comment 2 Documented in Chart    CBC     Status: Abnormal   Collection Time    03/16/13  4:03 PM      Result Value Range   WBC 11.3 (*) 4.0 - 10.5 K/uL   RBC 3.96  3.87 - 5.11 MIL/uL   Hemoglobin 11.1 (*) 12.0 - 15.0 g/dL   HCT 36.1   36.0 - 46.0 %   MCV 91.2  78.0 - 100.0 fL   MCH 28.0  26.0 - 34.0 pg   MCHC 30.7  30.0 - 36.0 g/dL   RDW 15.7 (*) 11.5 - 15.5 %   Platelets 223  150 - 400 K/uL  BASIC METABOLIC PANEL     Status: Abnormal   Collection Time    03/16/13  4:03 PM      Result Value Range   Sodium 136  135 - 145 mEq/L   Potassium 2.6 (*) 3.5 - 5.1 mEq/L   Comment: CRITICAL RESULT CALLED TO, READ BACK BY AND VERIFIED WITH:     CLARK,L. AT 1727 ON 07.02.14 BY LOVE,T.   Chloride 85 (*) 96 - 112 mEq/L   CO2 >45 (*) 19 - 32 mEq/L   Comment: CRITICAL RESULT CALLED TO, READ BACK BY AND VERIFIED WITH:     CLARK,L. AT 1727 ON 07.02.14 BY LOVE,T.   Glucose, Bld 222 (*) 70 - 99  mg/dL   BUN 4 (*) 6 - 23 mg/dL   Creatinine, Ser 0.50  0.50 - 1.10 mg/dL   Calcium 7.2 (*) 8.4 - 10.5 mg/dL   GFR calc non Af Amer >90  >90 mL/min   GFR calc Af Amer >90  >90 mL/min   Comment:            The eGFR has been calculated     using the CKD EPI equation.     This calculation has not been     validated in all clinical     situations.     eGFR's persistently     <90 mL/min signify     possible Chronic Kidney Disease.  GLUCOSE, CAPILLARY     Status: Abnormal   Collection Time    03/16/13  5:55 PM      Result Value Range   Glucose-Capillary 176 (*) 70 - 99 mg/dL   Comment 1 Notify RN     Comment 2 Documented in Chart    GLUCOSE, CAPILLARY     Status: Abnormal   Collection Time    03/16/13  9:40 PM      Result Value Range   Glucose-Capillary 153 (*) 70 - 99 mg/dL  GLUCOSE, CAPILLARY     Status: Abnormal   Collection Time    03/17/13  7:13 AM      Result Value Range   Glucose-Capillary 152 (*) 70 - 99 mg/dL  GLUCOSE, CAPILLARY     Status: Abnormal   Collection Time    03/17/13 11:37 AM      Result Value Range   Glucose-Capillary 162 (*) 70 - 99 mg/dL    Radiological Exams on Admission: No results found.  Assessment/Plan Principal Problem:   Dehiscence of surgical wound left knee Active Problems:   Morbid  obesity   Hypokalemia   Acidosis  Dehiscence of surgical Wound Left Knee -Per ortho.  Hypokalemia -Replete orally. -Check Mg level (suspect this may be low contributing to the hypokalemia).  Hypercarbia -She is a smoker. -She also seems to be a bit lethargic (suspect from narcotic use). -This is likely chronic respiratory acidosis (likely some COPD + OSA/OHS) with a possible acute component given narcotics  surrounding her surgical procedure. -She has chronic pain making her narcotic prescription a bit difficult. This has been addressed with the patient today by Dr. Alvan Dame. -I have been able to locate a BMET from 12/2010 and even then her bicarb was 44. -Will check an ABG to confirm that this is chronic and not acute. -If chronic respiratory acidosis, she should follow with pulmonary on DC and have PFTs for ?COPD, scheduled for an OP sleep study and may benefit from acetazolamide.   Time Spent on Consultation: 70 minutes.  HERNANDEZ ACOSTA,ESTELA [facility]  [phone] 03/17/2013, 12:47 PM

## 2013-03-17 NOTE — Progress Notes (Signed)
Occupational Therapy Treatment Patient Details Name: [name] MRN: [id number] DOB: 03-Nov-1960 Today's Date: 03/17/2013 Time: 1324-4010 OT Time Calculation (min): 28 min  OT Assessment / Plan / Recommendation  OT comments  Pt demonstrated better attention to task but still needs safety cues  Follow Up Recommendations  SNF    Barriers to Discharge       Equipment Recommendations       Recommendations for Other Services    Frequency Min 2X/week   Progress towards OT Goals Progress towards OT goals: Progressing toward goals  Plan Discharge plan needs to be updated    Precautions / Restrictions Precautions Precautions: Fall Restrictions LLE Weight Bearing: Weight bearing as tolerated   Pertinent Vitals/Pain 6/10 knee. Repositioned with ice    ADL  Lower Body Bathing: Minimal assistance Where Assessed - Lower Body Bathing: Supported sit to stand Toilet Transfer: Min guard;Simulated (bed to chair) Toilet Transfer Method: Sit to stand Equipment Used: Rolling walker Transfers/Ambulation Related to ADLs: Pt needs cues for safety with sit to stand.  Tends to put one foot out of walker legs ADL Comments: Pt did well with adl but could not reach bottom of foot Pt was able to attend to task for 5 minutes without redirection.   OT Diagnosis:    OT Problem List:   OT Treatment Interventions:     OT Goals(current goals can now be found in the care plan section)    Visit Information  Last OT Received On: 03/17/13 Assistance Needed: +1    Subjective Data      Prior Functioning       Cognition  Cognition Arousal/Alertness: Awake/alert Behavior During Therapy: WFL for tasks assessed/performed Overall Cognitive Status: Impaired/Different from baseline Area of Impairment: Safety/judgement Safety/Judgement: Decreased awareness of safety    Mobility  Bed Mobility Bed Mobility: Supine to Sit Supine to Sit: 4: Min assist Transfers Transfers: Sit to Stand;Stand to  Sit Sit to Stand: 4: Min guard;From elevated surface;From bed Stand to Sit: 4: Min guard;To chair/3-in-1;With armrests Details for Transfer Assistance: cues for safety and hand placement    Exercises      Balance     End of Session OT - End of Session Activity Tolerance: Patient tolerated treatment well Patient left: in chair;with call bell/phone within reach  GO     [facility] 03/17/2013, 11:31 AM Marica Otter, OTR/L [phone] 03/17/2013

## 2013-03-18 ENCOUNTER — Inpatient Hospital Stay (HOSPITAL_COMMUNITY): Payer: Medicare Other

## 2013-03-18 DIAGNOSIS — E119 Type 2 diabetes mellitus without complications: Secondary | ICD-10-CM

## 2013-03-18 DIAGNOSIS — R0609 Other forms of dyspnea: Secondary | ICD-10-CM

## 2013-03-18 DIAGNOSIS — E876 Hypokalemia: Secondary | ICD-10-CM

## 2013-03-18 LAB — BASIC METABOLIC PANEL
CO2: 44 mEq/L (ref 19–32)
Calcium: 7.5 mg/dL — ABNORMAL LOW (ref 8.4–10.5)
Creatinine, Ser: 0.62 mg/dL (ref 0.50–1.10)
GFR calc Af Amer: >90 mL/min (ref >90)
Sodium: 133 mEq/L — ABNORMAL LOW (ref 135–145)

## 2013-03-18 LAB — GLUCOSE, CAPILLARY: Glucose-Capillary: 196 mg/dL — ABNORMAL HIGH (ref 70–99)

## 2013-03-18 MED ORDER — ALUM & MAG HYDROXIDE-SIMETH 200-200-20 MG/5ML PO SUSP
30.0000 mL | ORAL | Status: DC | PRN
  Administered 2013-03-18 – 2013-03-21 (×5): 30 mL via ORAL
  Filled 2013-03-18 (×6): qty 30

## 2013-03-18 MED ORDER — MAGNESIUM SULFATE IN D5W 10-5 MG/ML-% IV SOLN
1.0000 g | Freq: Once | INTRAVENOUS | Status: AC
  Administered 2013-03-18: 1 g via INTRAVENOUS
  Filled 2013-03-18: qty 100

## 2013-03-18 MED ORDER — FLUTICASONE PROPIONATE HFA 110 MCG/ACT IN AERO
1.0000 | INHALATION_SPRAY | Freq: Two times a day (BID) | RESPIRATORY_TRACT | Status: DC
  Administered 2013-03-18 – 2013-03-21 (×7): 1 via RESPIRATORY_TRACT
  Filled 2013-03-18: qty 12

## 2013-03-18 NOTE — Progress Notes (Signed)
Utilization review completed.  

## 2013-03-18 NOTE — Progress Notes (Signed)
Physical Therapy Treatment Patient Details Name: [name] MRN: [id number] DOB: 09-Jan-1961 Today's Date: 03/18/2013 Time: 4098-1191 PT Time Calculation (min): 31 min  PT Assessment / Plan / Recommendation   PT Comments   Pt continues to be unsafe; Will benefit form STSNF  Follow Up Recommendations  SNF     Does the patient have the potential to tolerate intense rehabilitation     Barriers to Discharge        Equipment Recommendations  None recommended by PT    Recommendations for Other Services    Frequency Min 5X/week   Progress towards PT Goals Progress towards PT goals: Progressing toward goals  Plan Current plan remains appropriate    Precautions / Restrictions Precautions Precautions: Fall Precaution Comments: TKA last week; does not need KI Restrictions LLE Weight Bearing: Weight bearing as tolerated   Pertinent Vitals/Pain Pt O2 sats 88-90% on RA after amb; O2 replaced at 2L Pt CO2 levels continue to be significantly elevated    Mobility  Bed Mobility Bed Mobility: Sit to Supine Supine to Sit: 4: Min assist Details for Bed Mobility Assistance: assist with LLE off bed and increased time required Transfers Transfers: Sit to Stand;Stand to Sit Sit to Stand: 4: Min guard;From chair/3-in-1;From bed;With upper extremity assist Stand to Sit: 4: Min guard;To chair/3-in-1 Details for Transfer Assistance: Assist to rise, stabilize. LOB posteriorly with initial standing. multi-modal cues for hand placement and safety Ambulation/Gait Ambulation/Gait Assistance: 4: Min assist Ambulation Distance (Feet): 180 Feet (15) Assistive device: Rolling walker Ambulation/Gait Assistance Details: Assist to stabilize and maneuver with RW intermittently. slow gait speed Gait Pattern: Step-through pattern;Decreased stride length;Trunk flexed Gait velocity: decreased    Exercises Total Joint Exercises Ankle Circles/Pumps: AROM;Both;15 reps Quad Sets: AROM;Both;10 reps   PT  Diagnosis:    PT Problem List:   PT Treatment Interventions:     PT Goals (current goals can now be found in the care plan section) Acute Rehab PT Goals Patient Stated Goal: home.  Time For Goal Achievement: 03/29/13 Potential to Achieve Goals: Good  Visit Information  Last PT Received On: 03/18/13 Assistance Needed: +1 History of Present Illness: 52 yo woman admitted for L wound dehiscence, after fall, s/p partial patellofemoral arthroplasty 6/23. S/p excisional/non-excisional debridement L knee wound.     Subjective Data  Subjective: I wear O2 at home Patient Stated Goal: home.    Cognition  Cognition Arousal/Alertness: Lethargic Behavior During Therapy: WFL for tasks assessed/performed Area of Impairment: Safety/judgement Safety/Judgement: Decreased awareness of safety General Comments: continues to have difficulty with obstacles on left; lethargic and yet impulsive and unsafe when alert; pt attempts to stand  without RW and then lean and reach for it form EOB against Pt adivce; Pt required firm verbal commands to prevent fall; decreased insight into deficits    Balance     End of Session PT - End of Session Equipment Utilized During Treatment: Gait belt Activity Tolerance: Patient tolerated treatment well Patient left: in chair;with call bell/phone within reach Nurse Communication: Mobility status   GP     [facility] 03/18/2013, 11:09 AM

## 2013-03-18 NOTE — Progress Notes (Signed)
[name]  MRN: [id number] DOB/Age: 54/16/1962 54 y.o. Physician: Ander Slade, M.D. 4 Days Post-Op Procedure(s) (LRB): IRRIGATION AND DEBRIDEMENT  and Closure of wound left KNEE (Left)  Subjective: Feeling better today, just finished bathing, good appetite Vital Signs Temp:  [97.5 F (36.4 C)-98.7 F (37.1 C)] 97.9 F (36.6 C) (07/04 0437) Pulse Rate:  [88-99] 88 (07/04 0437) Resp:  [16] 16 (07/04 0437) BP: (112-125)/(76-80) 112/78 mmHg (07/04 0437) SpO2:  [95 %-98 %] 96 % (07/04 0437)  Lab Results  Recent Labs  03/16/13 1603  WBC 11.3*  HGB 11.1*  HCT 36.1  PLT 223   BMET  Recent Labs  03/16/13 1603 03/18/13 0451  NA 136 133*  K 2.6* 3.6  CL 85* 86*  CO2 >45* 44*  GLUCOSE 222* 154*  BUN 4* 8  CREATININE 0.50 0.62  CALCIUM 7.2* 7.5*   INR  Date Value Range Status  03/14/2013 1.77* 0.00 - 1.49 Final   Exam: Dressing with moderate serosanguinous drainage, incision with serous drainage from lateral incision, scant bloody drainage from midline incision, erythema diffusely, superficial skin slough 3x4 cm between 2 anterior incisions.  Plan Follow wound status closely, continue empiric abx  Anyra Kaufman M 03/18/2013, 8:57 AM

## 2013-03-18 NOTE — Progress Notes (Signed)
[facility] PROGRESS NOTE  [name] IRS:[id number] DOB: 01/03/1961 DOA: 03/14/2013  PCP: Shirline Frees, MD  Brief HPI: 52 y/o white woman admitted to the hospital on 03/14/13 complaining of wound dehiscence of the left knee after having a patellofemoral knee arthroplasty on 03/07/2013. Due to some concerns of the opening and some drainage she was admitted to the hospital for an irrigation and debridement of the left knee. Due to her previous arthroplasty it is decided to wash the knee out to help prevent possible infection. She has chronic pain syndrome and her pain management has been an issue. [facility] was asked to see her due to persistent hypokalemia and a bicarb >45 on her BMET.   Past medical history:  Past Medical History  Diagnosis Date  . Obesity   . Hypertension   . GERD (gastroesophageal reflux disease)   . Chronic pain   . Pulmonary embolism   . COPD (chronic obstructive pulmonary disease)   . Tobacco abuse   . CAD (coronary artery disease)   . Spondylolisthesis, grade 2   . Deviated septum   . Perforated bowel   . Diabetes mellitus without complication   . Hypercholesterolemia   . PONV (postoperative nausea and vomiting)   . Depression   . Shortness of breath   . Asthma   . Headache(784.0)     migraines  . Arthritis   . Sleep apnea     Primary Team: Ortho  Procedures: Recent wound closure and patello femoral arthroplasty  Antibiotics: Augmentin per primary service  Subjective: Patient complains of knee pain. Says she seems to wheezing more. Dry cough.  Objective: Vital Signs  Filed Vitals:   03/17/13 0500 03/17/13 1421 03/17/13 2146 03/18/13 0437  BP: 115/78 125/80 112/76 112/78  Pulse: 92 99 92 88  Temp: 98.8 F (37.1 C) 98.7 F (37.1 C) 97.5 F (36.4 C) 97.9 F (36.6 C)  TempSrc: Oral Oral Oral Axillary  Resp: 16 16 16 16   Height:      Weight:      SpO2: 92% 95% 98% 96%    Intake/Output Summary (Last 24 hours) at 03/18/13 1128 Last  data filed at 03/18/13 0844  Gross per 24 hour  Intake    530 ml  Output   1750 ml  Net  -1220 ml   Filed Weights   03/14/13 1337 03/14/13 2111  Weight: 103.3 kg (227 lb 11.8 oz) 106.142 kg (234 lb)    General appearance: alert, cooperative, appears stated age, no distress and moderately obese Resp: few crackles bilaterally at bases. No wheezing. Cardio: regular rate and rhythm, S1, S2 normal, no murmur, click, rub or gallop GI: soft, non-tender; bowel sounds normal; no masses,  no organomegaly Extremities: extremities normal, atraumatic, no cyanosis or edema Neurologic: Alert, distracted. No focal weakness.  Lab Results:  Basic Metabolic Panel:  Recent Labs Lab 03/14/13 1120 03/14/13 1830 03/15/13 0505 03/16/13 1603 03/17/13 1333 03/18/13 0451  NA 134* 137 136 136  --  133*  K 2.8* 4.2 2.7* 2.6*  --  3.6  CL 89*  --  89* 85*  --  86*  CO2 37*  --  42* >45*  --  44*  GLUCOSE 132* 130* 233* 222*  --  154*  BUN 8  --  5* 4*  --  8  CREATININE 0.53  --  0.55 0.50  --  0.62  CALCIUM 8.8  --  7.7* 7.2*  --  7.5*  MG  --   --   --   --  0.9*  --    CBC:  Recent Labs Lab 03/14/13 1120 03/14/13 1830 03/15/13 0505 03/16/13 1603  WBC 11.2*  --  9.0 11.3*  NEUTROABS 5.8  --   --   --   HGB 11.1* 13.3 11.3* 11.1*  HCT 35.0* 39.0 36.8 36.1  MCV 87.5  --  90.0 91.2  PLT 273  --  240 223   CBG:  Recent Labs Lab 03/17/13 0713 03/17/13 1137 03/17/13 1735 03/17/13 2155 03/18/13 0709  GLUCAP 152* 162* 165* 196* 138*    No results found for this or any previous visit (from the past 240 hour(s)).    Studies/Results: No results found.  Medications:  Scheduled: . alprazolam  1 mg Oral Daily  . ALPRAZolam  2 mg Oral QHS  . amoxicillin-clavulanate  1 tablet Oral Q12H  . aspirin  325 mg Oral BID  . bumetanide  2 mg Oral BID  . cyclobenzaprine  10 mg Oral TID  . docusate sodium  100 mg Oral BID  . escitalopram  10 mg Oral QHS  . estradiol  2 mg Oral Daily  .  ferrous sulfate  325 mg Oral TID PC  . insulin aspart  0-9 Units Subcutaneous TID WC  . ketorolac  15 mg Intravenous Q6H  . metFORMIN  500 mg Oral BID WC  . OxyCODONE  20 mg Oral Q12H  . pantoprazole  80 mg Oral Daily  . polyethylene glycol  17 g Oral BID  . potassium chloride  30 mEq Oral BID  . simvastatin  20 mg Oral QHS   Continuous: . 0.9 % NaCl with KCl 40 mEq / L Stopped (03/16/13 1400)   ZUD:[id number], acetaminophen, albuterol, fluticasone, lidocaine, menthol-cetylpyridinium, metoCLOPramide (REGLAN) injection, metoCLOPramide, metolazone, nitroGLYCERIN, nystatin, olopatadine, ondansetron (ZOFRAN) IV, ondansetron, oxyCODONE, phenol, polyethylene glycol, promethazine, simethicone  Assessment/Plan:  Principal Problem:   Dehiscence of surgical wound left knee Active Problems:   Morbid obesity   Hypokalemia   Acidosis   Diabetes    Dehiscence of surgical Wound Left Knee  Per ortho.   Hypokalemia and Hypomagnesemia Repleted. Recheck Mg level. Continue ongoing Potassium supplementation as she is on diuretics.  Hypercarbia  Multifactorial: COPD, Chr resp failure, OSA. ABG shows compensated respiratory acidosis. Caution with pain medications. She continues to smoke cigarettes. She should follow up with pulmonology as OP with PFT. Will get CXR since she is complaining of cough and shortness of breath. Should consider inhaled steroids. Agree with IS.  Further management per primary team.   We will follow daily. Thank you.   LOS: 4 days   [facility] Pager [phone] 03/18/2013, 11:28 AM  If 8PM-8AM, please contact night-coverage at [email or web address], password [facility]

## 2013-03-18 NOTE — Progress Notes (Signed)
Physical Therapy Treatment Patient Details Name: [name] MRN: [id number] DOB: 14-Feb-1961 Today's Date: 03/18/2013 Time: 1308-6578 PT Time Calculation (min): 28 min  PT Assessment / Plan / Recommendation  PT Comments     Follow Up Recommendations  SNF     Does the patient have the potential to tolerate intense rehabilitation     Barriers to Discharge        Equipment Recommendations  None recommended by PT    Recommendations for Other Services    Frequency Min 5X/week   Progress towards PT Goals Progress towards PT goals: Progressing toward goals  Plan Current plan remains appropriate    Precautions / Restrictions Precautions Precautions: Fall Precaution Comments: TKA last week; does not need KI Restrictions LLE Weight Bearing: Weight bearing as tolerated   Pertinent Vitals/Pain     Mobility  Bed Mobility Bed Mobility: Sit to Supine Sit to Supine: 5: Supervision Details for Bed Mobility Assistance: supervision for safety Transfers Transfers: Sit to Stand;Stand to Sit Sit to Stand: 4: Min guard;From bed Stand to Sit: 4: Min guard;To bed Details for Transfer Assistance: min/guard for safety and balance; verbal cues for hand placement Ambulation/Gait Ambulation/Gait Assistance: 4: Min assist Ambulation Distance (Feet): 180 Feet (7') Assistive device: Rolling walker Ambulation/Gait Assistance Details: Assist to stabilize and maneuver with RW intermittently. slow gait speed Gait Pattern: Step-through pattern;Decreased stride length;Trunk flexed Gait velocity: decreased General Gait Details: pt continues to require assist to "steer" RW and avoid obstacles on Left    Exercises     PT Diagnosis:    PT Problem List:   PT Treatment Interventions:     PT Goals (current goals can now be found in the care plan section) Acute Rehab PT Goals Patient Stated Goal: home.  Time For Goal Achievement: 03/29/13 Potential to Achieve Goals: Good  Visit Information  Last  PT Received On: 03/18/13 Assistance Needed: +1 History of Present Illness: 52 yo woman admitted for L wound dehiscence, after fall, s/p partial patellofemoral arthroplasty 6/23. S/p excisional/non-excisional debridement L knee wound.     Subjective Data  Subjective: pt standing up in room alone Patient Stated Goal: home.    Cognition  Cognition Arousal/Alertness: Lethargic Behavior During Therapy: WFL for tasks assessed/performed Area of Impairment: Safety/judgement Safety/Judgement: Decreased awareness of safety General Comments: pt continues to be slow to process, lethargic (?due to meds); arouses easily but closes eyes frequently, even while walking    Pension scheme manager Standing - Balance Support: Bilateral upper extremity supported Static Standing - Level of Assistance: 5: Stand by assistance  End of Session PT - End of Session Equipment Utilized During Treatment: Gait belt Activity Tolerance: Patient tolerated treatment well Patient left: in bed;with call bell/phone within reach;with bed alarm set Nurse Communication: Mobility status   GP     [facility] 03/18/2013, 3:39 PM

## 2013-03-19 DIAGNOSIS — G4730 Sleep apnea, unspecified: Secondary | ICD-10-CM

## 2013-03-19 DIAGNOSIS — J449 Chronic obstructive pulmonary disease, unspecified: Secondary | ICD-10-CM

## 2013-03-19 LAB — BASIC METABOLIC PANEL
BUN: 10 mg/dL (ref 6–23)
GFR calc Af Amer: >90 mL/min (ref >90)
GFR calc non Af Amer: >90 mL/min (ref >90)
Potassium: 4.1 mEq/L (ref 3.5–5.1)
Sodium: 135 mEq/L (ref 135–145)

## 2013-03-19 LAB — GLUCOSE, CAPILLARY: Glucose-Capillary: 138 mg/dL — ABNORMAL HIGH (ref 70–99)

## 2013-03-19 LAB — CBC
HCT: 32.3 % — ABNORMAL LOW (ref 36.0–46.0)
Hemoglobin: 10.2 g/dL — ABNORMAL LOW (ref 12.0–15.0)
MCHC: 31.6 g/dL (ref 30.0–36.0)
RBC: 3.65 MIL/uL — ABNORMAL LOW (ref 3.87–5.11)
RDW: 15.7 % — ABNORMAL HIGH (ref 11.5–15.5)
WBC: 8.7 10*3/uL (ref 4.0–10.5)

## 2013-03-19 MED ORDER — MAGNESIUM OXIDE 400 (241.3 MG) MG PO TABS: 400.0000 mg | ORAL_TABLET | Freq: Every day | ORAL | Status: DC

## 2013-03-19 MED ORDER — METHOCARBAMOL 500 MG PO TABS
500.0000 mg | ORAL_TABLET | Freq: Four times a day (QID) | ORAL | Status: DC | PRN
  Administered 2013-03-19 – 2013-03-20 (×3): 500 mg via ORAL
  Filled 2013-03-19 (×3): qty 1

## 2013-03-19 MED ORDER — MAGNESIUM OXIDE 400 (241.3 MG) MG PO TABS
400.0000 mg | ORAL_TABLET | Freq: Every day | ORAL | Status: DC
  Administered 2013-03-19 – 2013-03-21 (×3): 400 mg via ORAL
  Filled 2013-03-19 (×3): qty 1

## 2013-03-19 MED ORDER — FLUTICASONE PROPIONATE HFA 110 MCG/ACT IN AERO: 1.0000 | INHALATION_SPRAY | Freq: Two times a day (BID) | RESPIRATORY_TRACT | Status: DC

## 2013-03-19 NOTE — Progress Notes (Signed)
Physical Therapy Treatment Patient Details Name: [name] MRN: [id number] DOB: Nov 18, 1960 Today's Date: 03/19/2013 Time: 0920-0959 PT Time Calculation (min): 39 min  PT Assessment / Plan / Recommendation  PT Comments   Pt doing much better this session; Pt demo's incr safety and balance having had less pain meds this am; Pt husband concerned about her taking too much pain medicine at home and possibly falling (PT agrees this is an issue); If pt can continue to maintain this level of alertness she may be able to D/C home with HHPT vs SNF;   Follow Up Recommendations  Home health PT;SNF (depending on pt safety (variable))     Does the patient have the potential to tolerate intense rehabilitation     Barriers to Discharge        Equipment Recommendations  None recommended by PT    Recommendations for Other Services    Frequency Min 5X/week   Progress towards PT Goals Progress towards PT goals: Progressing toward goals  Plan Discharge plan needs to be updated    Precautions / Restrictions Precautions Precautions: Fall Precaution Comments: meds greatly effect pt safety/fall risk Restrictions LLE Weight Bearing: Weight bearing as tolerated   Pertinent Vitals/Pain Pain tolerable per pt; some soreness after there ex, ice to knee and RN aware/present    Mobility  Bed Mobility Bed Mobility: Supine to Sit Supine to Sit: 5: Supervision;With rails Details for Bed Mobility Assistance: supervision for safety Transfers Transfers: Sit to Stand;Stand to Sit Sit to Stand: 5: Supervision;From bed;With upper extremity assist Stand to Sit: 5: Supervision;To chair/3-in-1;With armrests Details for Transfer Assistance: incr time; subtle cues for hand placement Ambulation/Gait Ambulation/Gait Assistance: 5: Supervision Ambulation Distance (Feet): 220 Feet Assistive device: Rolling walker Ambulation/Gait Assistance Details: cues for equal step length Gait Pattern: Step-through  pattern General Gait Details: pt much safer, more alert and able to maneveur RW without physical assist today; likely due to decr levels of pain meds priro to session this am    Exercises Total Joint Exercises Ankle Circles/Pumps: AROM;Both;15 reps Quad Sets: AROM;Both;10 reps Heel Slides: AAROM;Left;10 reps Straight Leg Raises: AROM;AAROM;Left;10 reps   PT Diagnosis:    PT Problem List:   PT Treatment Interventions:     PT Goals (current goals can now be found in the care plan section) Acute Rehab PT Goals Patient Stated Goal: home vs rehab Time For Goal Achievement: 03/29/13 Potential to Achieve Goals: Good  Visit Information  Last PT Received On: 03/19/13 Assistance Needed: +1 History of Present Illness: 52 yo woman admitted for L wound dehiscence, after fall, s/p partial patellofemoral arthroplasty 6/23. S/p excisional/non-excisional debridement L knee wound.     Subjective Data  Patient Stated Goal: home vs rehab   Cognition  Cognition Arousal/Alertness: Awake/alert Behavior During Therapy: WFL for tasks assessed/performed Overall Cognitive Status: Within Functional Limits for tasks assessed    Balance  Static Standing Balance Static Standing - Balance Support: No upper extremity supported;During functional activity (donning gown) Static Standing - Level of Assistance: 5: Stand by assistance  End of Session PT - End of Session Equipment Utilized During Treatment: Gait belt Activity Tolerance: Patient tolerated treatment well Patient left: in chair;with call bell/phone within reach;with family/visitor present;with nursing/sitter in room Nurse Communication: Mobility status   GP     [facility] 03/19/2013, 10:10 AM

## 2013-03-19 NOTE — Progress Notes (Signed)
Pt is a high fall risk and bed alarm was turned on to ensure pt. Safety.  Bed alarm was heard going off.  RN went to check on pt. And found pt. To be out of bed and in the bathroom with bed alarm still going off. RN stated the importance of calling for assistance to the bathroom and explanation of why bed alarm was being used.  Pt. Was assisted to the restroom and assisted back to bed.  Pt. Was left in the bed in with the bed alarm on. Will continue to monitor.

## 2013-03-19 NOTE — Progress Notes (Signed)
Subjective: 5 Days Post-Op Procedure(s) (LRB): IRRIGATION AND DEBRIDEMENT  and Closure of wound left KNEE (Left) Patient reports pain as moderate.  Per nursing she has been scratching at her incision, she is c/o itching. She reports her pain is relatively well controlled. No other c/o. Seen by myself and Dr. Shelle Iron in AM rounds.  Objective: Vital signs in last 24 hours: Temp:  [96.8 F (36 C)-98 F (36.7 C)] 98 F (36.7 C) (07/05 0552) Pulse Rate:  [83-89] 89 (07/05 0552) Resp:  [16-20] 18 (07/05 0552) BP: (98-121)/(58-73) 98/61 mmHg (07/05 0552) SpO2:  [90 %-96 %] 90 % (07/05 0552)  Intake/Output from previous day: 07/04 0701 - 07/05 0700 In: 1300 [P.O.:480; I.V.:720; IV Piggyback:100] Out: 1450 [Urine:1450] Intake/Output this shift:     Recent Labs  03/16/13 1603 03/19/13 0443  HGB 11.1* 10.2*    Recent Labs  03/16/13 1603 03/19/13 0443  WBC 11.3* 8.7  RBC 3.96 3.65*  HCT 36.1 32.3*  PLT 223 224    Recent Labs  03/18/13 0451 03/19/13 0443  NA 133* 135  K 3.6 4.1  CL 86* 92*  CO2 44* 39*  BUN 8 10  CREATININE 0.62 0.66  GLUCOSE 154* 173*  CALCIUM 7.5* 8.0*   No results found for this basename: LABPT, INR,  in the last 72 hours  Neurologically intact ABD soft Neurovascular intact Sensation intact distally Intact pulses distally Dorsiflexion/Plantar flexion intact Incision: dressing C/D/I and scant drainage No cellulitis present Compartment soft No calf pain or sign of DVT Scant amt serous drainage on dressing  Assessment/Plan: 5 Days Post-Op Procedure(s) (LRB): IRRIGATION AND DEBRIDEMENT  and Closure of wound left KNEE (Left) Advance diet Up with therapy Dressing changed this AM Continue daily dressing changes Will stay through the weekend per Dr. Chari Manning, Dayna Barker. 03/19/2013, 7:52 AM

## 2013-03-19 NOTE — Progress Notes (Signed)
Pt. Was found to be using a comb to scratch the skin on her back and the area around the incision on her knee.  Pt. Was informed about the importance of keeping the incision clean and dry and the risks scratching with a comb can impose. Will continue to monitor.

## 2013-03-19 NOTE — Progress Notes (Addendum)
[facility] PROGRESS NOTE  ATALIE OROS VFI:[id number] DOB: 1961/08/01 DOA: 03/14/2013  PCP: Shirline Frees, MD  Brief HPI: 52 y/o white woman admitted to the hospital on 03/14/13 complaining of wound dehiscence of the left knee after having a patellofemoral knee arthroplasty on 03/07/2013. Due to some concerns of the opening and some drainage she was admitted to the hospital for an irrigation and debridement of the left knee. Due to her previous arthroplasty it is decided to wash the knee out to help prevent possible infection. She has chronic pain syndrome and her pain management has been an issue. TRH was asked to see her due to persistent hypokalemia and a bicarb >45 on her BMET.   Past medical history:  Past Medical History  Diagnosis Date  . Obesity   . Hypertension   . GERD (gastroesophageal reflux disease)   . Chronic pain   . Pulmonary embolism   . COPD (chronic obstructive pulmonary disease)   . Tobacco abuse   . CAD (coronary artery disease)   . Spondylolisthesis, grade 2   . Deviated septum   . Perforated bowel   . Diabetes mellitus without complication   . Hypercholesterolemia   . PONV (postoperative nausea and vomiting)   . Depression   . Shortness of breath   . Asthma   . Headache(784.0)     migraines  . Arthritis   . Sleep apnea     Primary Team: Ortho  Procedures: Recent wound closure and patello femoral arthroplasty  Antibiotics: Augmentin per primary service  Subjective: Patient complains of itching in back and around knee. Breathing is better.  Objective: Vital Signs  Filed Vitals:   03/18/13 2129 03/19/13 0006 03/19/13 0552 03/19/13 0817  BP: 121/73  98/61   Pulse: 86 83 89   Temp: 97.8 F (36.6 C)  98 F (36.7 C)   TempSrc: Oral  Oral   Resp: 20 16 18    Height:      Weight:      SpO2: 91% 94% 90% 93%    Intake/Output Summary (Last 24 hours) at 03/19/13 0932 Last data filed at 03/19/13 0600  Gross per 24 hour  Intake   1060 ml   Output   1100 ml  Net    -40 ml   Filed Weights   03/14/13 1337 03/14/13 2111  Weight: 103.3 kg (227 lb 11.8 oz) 106.142 kg (234 lb)    General appearance: alert, cooperative, appears stated age, no distress and moderately obese Resp: Much improved air entry. No wheezing. Cardio: regular rate and rhythm, S1, S2 normal, no murmur, click, rub or gallop GI: soft, non-tender; bowel sounds normal; no masses,  no organomegaly Extremities: extremities normal, atraumatic, no cyanosis or edema Neurologic: Alert, distracted. No focal weakness.  Lab Results:  Basic Metabolic Panel:  Recent Labs Lab 03/14/13 1120 03/14/13 1830 03/15/13 0505 03/16/13 1603 03/17/13 1333 03/18/13 0451 03/19/13 0443  NA 134* 137 136 136  --  133* 135  K 2.8* 4.2 2.7* 2.6*  --  3.6 4.1  CL 89*  --  89* 85*  --  86* 92*  CO2 37*  --  42* >45*  --  44* 39*  GLUCOSE 132* 130* 233* 222*  --  154* 173*  BUN 8  --  5* 4*  --  8 10  CREATININE 0.53  --  0.55 0.50  --  0.62 0.66  CALCIUM 8.8  --  7.7* 7.2*  --  7.5* 8.0*  MG  --   --   --   --  0.9* 1.5  --    CBC:  Recent Labs Lab 03/14/13 1120 03/14/13 1830 03/15/13 0505 03/16/13 1603 03/19/13 0443  WBC 11.2*  --  9.0 11.3* 8.7  NEUTROABS 5.8  --   --   --   --   HGB 11.1* 13.3 11.3* 11.1* 10.2*  HCT 35.0* 39.0 36.8 36.1 32.3*  MCV 87.5  --  90.0 91.2 88.5  PLT 273  --  240 223 224   CBG:  Recent Labs Lab 03/18/13 0709 03/18/13 1156 03/18/13 1752 03/18/13 2135 03/19/13 0714  GLUCAP 138* 175* 171* 147* 109*    No results found for this or any previous visit (from the past 240 hour(s)).    Studies/Results: Dg Chest 2 View  03/18/2013   *RADIOLOGY REPORT*  Clinical Data: Cough and shortness of breath.  CHEST - 2 VIEW  Comparison: 03/01/2013  Findings: The heart size is normal.  Pulmonary vascularity is slightly prominent.  There are linear areas of atelectasis in the mid and lower lung zones bilaterally.  No effusions.  No consolidative  infiltrates.  No acute osseous abnormality.  IMPRESSION: Bilateral atelectasis, new.  Slight pulmonary vascular prominence.   Original Report Authenticated By: Lorriane Shire, M.D.    Medications:  Scheduled: . alprazolam  1 mg Oral Daily  . ALPRAZolam  2 mg Oral QHS  . amoxicillin-clavulanate  1 tablet Oral Q12H  . aspirin  325 mg Oral BID  . bumetanide  2 mg Oral BID  . cyclobenzaprine  10 mg Oral TID  . docusate sodium  100 mg Oral BID  . escitalopram  10 mg Oral QHS  . estradiol  2 mg Oral Daily  . ferrous sulfate  325 mg Oral TID PC  . fluticasone  1 puff Inhalation BID  . insulin aspart  0-9 Units Subcutaneous TID WC  . metFORMIN  500 mg Oral BID WC  . OxyCODONE  20 mg Oral Q12H  . pantoprazole  80 mg Oral Daily  . polyethylene glycol  17 g Oral BID  . potassium chloride  30 mEq Oral BID  . simvastatin  20 mg Oral QHS   Continuous: . 0.9 % NaCl with KCl 40 mEq / L 50 mL/hr at 03/19/13 0600   IWO:EHOZYYQMGNOIB, acetaminophen, albuterol, alum & mag hydroxide-simeth, fluticasone, lidocaine, menthol-cetylpyridinium, metoCLOPramide (REGLAN) injection, metoCLOPramide, metolazone, nitroGLYCERIN, nystatin, olopatadine, ondansetron (ZOFRAN) IV, ondansetron, oxyCODONE, phenol, polyethylene glycol, promethazine  Assessment/Plan:  Principal Problem:   Dehiscence of surgical wound left knee Active Problems:   Morbid obesity   Hypokalemia   Acidosis   Diabetes    Dehiscence of surgical Wound Left Knee  Per ortho.   Hypokalemia and Hypomagnesemia Repleted. Recheck Mg level was slightly low and was repleted again on 7/4. Will start oral MgO. Continue ongoing Potassium supplementation as she is on diuretics.  Hypercarbia  Multifactorial: COPD, Chr resp failure, OSA. ABG shows compensated respiratory acidosis. Caution with pain medications. She continues to smoke cigarettes. She should follow up with pulmonology as OP with PFT. CXr reviewed and showed atelectasis. IS was strongly  recommend to patient. She was also asked to stop smoking. Nicotine patch can be offered. Flovent should be continued.   Further management per primary team.   Since patient is improved, TRH will sign off. Please call with questions. Please see above for recommendations.   LOS: 5 days   [place] [facility] Pager [phone] 03/19/2013, 9:32 AM  If 8PM-8AM, please contact night-coverage at [email or web address], password [facility]

## 2013-03-20 LAB — GLUCOSE, CAPILLARY: Glucose-Capillary: 146 mg/dL — ABNORMAL HIGH (ref 70–99)

## 2013-03-20 MED ORDER — NON FORMULARY: 20.0000 mg | Freq: Every day | Status: DC | PRN

## 2013-03-20 MED ORDER — ESOMEPRAZOLE MAGNESIUM 20 MG PO CPDR
20.0000 mg | DELAYED_RELEASE_CAPSULE | Freq: Every day | ORAL | Status: DC | PRN
  Filled 2013-03-20: qty 1

## 2013-03-20 NOTE — Progress Notes (Signed)
    Subjective: 6 Days Post-Op Procedure(s) (LRB): IRRIGATION AND DEBRIDEMENT  and Closure of wound left KNEE (Left) Patient reports pain as 5 on 0-10 scale.   Denies CP or SOB.  Voiding without difficulty. Positive flatus. Objective: Vital signs in last 24 hours: Temp:  [97.7 F (36.5 C)-98.1 F (36.7 C)] 98.1 F (36.7 C) (07/05 2122) Pulse Rate:  [86-92] 92 (07/05 2122) Resp:  [14-18] 16 (07/06 0400) BP: (105-114)/(71-75) 105/71 mmHg (07/05 2122) SpO2:  [93 %-94 %] 94 % (07/05 2302)  Intake/Output from previous day: 07/05 0701 - 07/06 0700 In: 2160 [P.O.:960; I.V.:1200] Out: -  Intake/Output this shift:    Labs:  Recent Labs  03/19/13 0443  HGB 10.2*    Recent Labs  03/19/13 0443  WBC 8.7  RBC 3.65*  HCT 32.3*  PLT 224    Recent Labs  03/18/13 0451 03/19/13 0443  NA 133* 135  K 3.6 4.1  CL 86* 92*  CO2 44* 39*  BUN 8 10  CREATININE 0.62 0.66  GLUCOSE 154* 173*  CALCIUM 7.5* 8.0*   No results found for this basename: LABPT, INR,  in the last 72 hours  Physical Exam: Neurologically intact ABD soft Intact pulses distally Incision: dressing C/D/I and no drainage No cellulitis present Compartment soft  Assessment/Plan: 6 Days Post-Op Procedure(s) (LRB): IRRIGATION AND DEBRIDEMENT  and Closure of wound left KNEE (Left) Up with therapy Discharge to SNF  [facility] D for Dr. Melina Schools [facility] [phone] 03/20/2013, 9:56 AM

## 2013-03-20 NOTE — Progress Notes (Signed)
Pt called Clinical research associate to room and asked that "Nurse bring Gauze". When writer entered room pt had taken surgical dressing off and said incision was "itching". Writer redressed surgical site with Gauze and omitted Ace Wrap as pt c/o "pressure". Educated on importance of keeping Incision clean and avoiding dirty hands. Pt understands she should not scratch or even touch area where sutures are.

## 2013-03-20 NOTE — Progress Notes (Signed)
Physical Therapy Treatment Patient Details Name: [name] MRN: [id number] DOB: 1961/05/03 Today's Date: 03/20/2013 Time: 4098-1191 PT Time Calculation (min): 24 min  PT Assessment / Plan / Recommendation  PT Comments   Pt not feeling well; Needing more assist today;  Follow Up Recommendations  SNF;Home health PT;Supervision/Assistance - 24 hour     Does the patient have the potential to tolerate intense rehabilitation     Barriers to Discharge        Equipment Recommendations  None recommended by PT    Recommendations for Other Services    Frequency Min 5X/week   Progress towards PT Goals Progress towards PT goals: Progressing toward goals  Plan Current plan remains appropriate    Precautions / Restrictions Precautions Precautions: Fall Restrictions LLE Weight Bearing: Weight bearing as tolerated   Pertinent Vitals/Pain C/o pain and nausea, RN present   Mobility  Bed Mobility Bed Mobility: Sit to Supine Sit to Supine: 4: Min guard Details for Bed Mobility Assistance: min/guard with LLE, safety Transfers Transfers: Sit to Stand;Stand to Sit Sit to Stand: 5: Supervision Stand to Sit: 5: Supervision Details for Transfer Assistance: incr time; subtle cues for hand placement Ambulation/Gait Ambulation/Gait Assistance: 4: Min guard Ambulation Distance (Feet): 12 Feet Assistive device: Rolling walker Ambulation/Gait Assistance Details: verbal cues for sequence, RW distance, step length; Gait Pattern: Step-to pattern;Step-through pattern Gait velocity: decreased General Gait Details: pt requring slightly more assist today due to not feeling well    Exercises     PT Diagnosis:    PT Problem List:   PT Treatment Interventions:     PT Goals (current goals can now be found in the care plan section) Acute Rehab PT Goals Patient Stated Goal: home vs rehab Time For Goal Achievement: 03/29/13 Potential to Achieve Goals: Good  Visit Information  Last PT Received On:  03/20/13 Assistance Needed: +1 History of Present Illness: 52 yo woman admitted for L wound dehiscence, after fall, s/p partial patellofemoral arthroplasty 6/23. S/p excisional/non-excisional debridement L knee wound.     Subjective Data  Subjective: pt in bathroom, admittedly got there by herself Patient Stated Goal: home vs rehab   Cognition  Cognition Arousal/Alertness: Awake/alert Behavior During Therapy: WFL for tasks assessed/performed Area of Impairment: Safety/judgement Safety/Judgement: Decreased awareness of safety General Comments: pt with c/o nausea, states due to not having the correct reflux meds    Balance  Static Standing Balance Static Standing - Balance Support: During functional activity;Right upper extremity supported Static Standing - Level of Assistance: 5: Stand by assistance  End of Session PT - End of Session Equipment Utilized During Treatment: Gait belt Activity Tolerance: Patient tolerated treatment well Patient left: in bed;with call bell/phone within reach;with bed alarm set Nurse Communication: Mobility status   GP     [facility] 03/20/2013, 12:49 PM

## 2013-03-21 MED ORDER — AMOXICILLIN-POT CLAVULANATE 875-125 MG PO TABS: 1.0000 | ORAL_TABLET | Freq: Two times a day (BID) | ORAL | Status: DC

## 2013-03-21 MED ORDER — OXYCODONE HCL 20 MG PO TB12: 20.0000 mg | ORAL_TABLET | Freq: Two times a day (BID) | ORAL | Status: DC

## 2013-03-21 MED ORDER — POLYETHYLENE GLYCOL 3350 17 G PO PACK: 17.0000 g | PACK | Freq: Two times a day (BID) | ORAL | Status: DC

## 2013-03-21 MED ORDER — OXYCODONE HCL 10 MG PO TABS: 10.0000 mg | ORAL_TABLET | ORAL | Status: DC | PRN

## 2013-03-21 MED ORDER — CYCLOBENZAPRINE HCL 10 MG PO TABS: 10.0000 mg | ORAL_TABLET | Freq: Three times a day (TID) | ORAL | Status: DC | PRN

## 2013-03-21 NOTE — Progress Notes (Signed)
Clinical Social Work Department CLINICAL SOCIAL WORK PLACEMENT NOTE 03/21/2013  Patient:  [name], [name]  Account Number:  [id number] Admit date:  03/14/2013  Clinical Social Worker:  Cori Razor, LCSW  Date/time:  03/16/2013 12:36 PM  Clinical Social Work is seeking post-discharge placement for this patient at the following level of care:   SKILLED NURSING   (*CSW will update this form in Epic as items are completed)   03/16/2013  Patient/family provided with [facility] Department of Clinical Social Work's list of facilities offering this level of care within the geographic area requested by the patient (or if unable, by the patient's family).  03/16/2013  Patient/family informed of their freedom to choose among providers that offer the needed level of care, that participate in Medicare, Medicaid or managed care program needed by the patient, have an available bed and are willing to accept the patient.  03/16/2013  Patient/family informed of [facility]' ownership interest in [facility], as well as of the fact that they are under no obligation to receive care at this facility.  PASARR submitted to EDS on  PASARR number received from E[employer] on 03/07/2013  FL2 transmitted to all facilities in geographic area requested by pt/family on  03/16/2013 FL2 transmitted to all facilities within larger geographic area on   Patient informed that his/her managed care company has contracts with or will negotiate with  certain facilities, including the following:     Patient/family informed of bed offers received:  03/16/2013 Patient chooses bed at [facility] [place] Physician recommends and patient chooses bed at    Patient to be transferred to [facility] [place] on  03/21/2013 Patient to be transferred to facility by [facility]  The following physician request were entered in Epic:   Additional Comments: Cori Razor LCSW [phone]

## 2013-03-21 NOTE — Discharge Summary (Signed)
Physician Discharge Summary  Patient ID: [name] MRN: [id number] DOB/AGE: August 06, 1961 52 y.o.  Admit date: 03/14/2013 Discharge date:  03/21/2013  Procedures:  Procedure(s) (LRB): IRRIGATION AND DEBRIDEMENT  and Closure of wound left KNEE (Left)  Attending Physician:  Dr. Durene Romans   Admission Diagnoses:   Wound dehiscence of the left knee  Discharge Diagnoses:  Principal Problem:   Dehiscence of surgical wound left knee Active Problems:   Morbid obesity   Hypokalemia   Acidosis   Diabetes  Past Medical History  Diagnosis Date  . Obesity   . Hypertension   . GERD (gastroesophageal reflux disease)   . Chronic pain   . Pulmonary embolism   . COPD (chronic obstructive pulmonary disease)   . Tobacco abuse   . CAD (coronary artery disease)   . Spondylolisthesis, grade 2   . Deviated septum   . Perforated bowel   . Diabetes mellitus without complication   . Hypercholesterolemia   . PONV (postoperative nausea and vomiting)   . Depression   . Shortness of breath   . Asthma   . Headache(784.0)     migraines  . Arthritis   . Sleep apnea     HPI: Pt is a 52 y.o. female complaining of wound dehiscence of the left knee after having a patellofemoral knee arthroplasty on 03/07/2013. This area was closed during the procedure with Derma Bond, but since being home have opened up. Do to some concerns of the opening and some drainage she was admitted to the hospital for an irrigation and debridement of the left knee. Do to her previous arthroplasty it is decided to wash the knee out to help prevent possible infection. She has been taking pain medications for a long time and knows that sometimes her pain is not easy to control. She has stated what medications she would like afterwards, We have stressed that we can't have her in a stupor from too much medication and will be working to prevent it. Various options are discussed with the patient. Risks, benefits and expectations were  discussed with the patient. Patient understand the risks, benefits and expectations and wishes to proceed with surgery.  PCP: Johny Blamer, MD   Discharged Condition: good  Hospital Course:  Patient underwent the above stated procedure on 03/14/2013. Patient tolerated the procedure well and brought to the recovery room in good condition and subsequently to the floor.  Patient had a relatively uneventful course throughout her hospital stay. Medicine did see the patient regarding breathing issues as well as hypercarbia most likely do to narcotic use.  She also had hypokalemia and hypomagnesemia diagnosed and treated by medicine.  Multiple discussions were had regarding trying to limit her narcotic use. Her vital signs remained stable throughout her visit. Wound continued to improve and look better throughout the time in the hospital. Dressing was changed to Aquacel dressing on the medial aspect of the wound with Xeroform over the lateral aspect.   Discharge Exam: General appearance: cooperative and no distress Extremities: Homans sign is negative, no sign of DVT, no edema, redness or tenderness in the calves or thighs and no ulcers, gangrene or trophic changes  Disposition: SNF with follow up in 2 weeks   Follow-up Information   Follow up with Johny Blamer, MD. Schedule an appointment as soon as possible for a visit in 2 weeks. (Please refer to pulmonologist for COPD management)    Contact information:   [facility] [address]  [address] [place] [address] [phone]       Follow up with Shelda Pal, MD. Schedule an appointment as soon as possible for a visit in 1 week.   Contact information:   [address] [place] [address] [phone]       Discharge Orders   Future Orders Complete By Expires     Call MD / Call 911  As directed     Comments:      If you experience chest pain or shortness of breath, CALL 911 and be  transported to the hospital emergency room.  If you develope a fever above 101 F, pus (white drainage) or increased drainage or redness at the wound, or calf pain, call your surgeon's office.    Constipation Prevention  As directed     Comments:      Drink plenty of fluids.  Prune juice may be helpful.  You may use a stool softener, such as Colace (over the counter) 100 mg twice a day.  Use MiraLax (over the counter) for constipation as needed.    Diet - low sodium heart healthy  As directed     Discharge instructions  As directed     Comments:      Aquacel dressing (present on d/c from hospital and patient has a sample) on the incision on the medial aspect of the knee and use Xeroform on the incision on the lateral aspect of the knee. Cover all cotton roll and then ACE bandage. Keep the area dry and clean until follow up. Follow up in 1 weeks at [facility]. Call with any questions or concerns.    Increase activity slowly as tolerated  As directed     Weight bearing as tolerated  As directed          Medication List    STOP taking these medications       rivaroxaban 10 MG Tabs tablet  Commonly known as:  XARELTO      TAKE these medications       albuterol 108 (90 BASE) MCG/ACT inhaler  Commonly known as:  PROVENTIL HFA;VENTOLIN HFA  Inhale 2 puffs into the lungs every 4 (four) hours as needed. Shortness of breath/wheezing.     alprazolam 2 MG tablet  Commonly known as:  XANAX  Take 1-2 mg by mouth 2 (two) times daily. Takes 1/2 in the morning and 1 tablets at bedtime     amoxicillin-clavulanate 875-125 MG per tablet  Commonly known as:  AUGMENTIN  Take 1 tablet by mouth every 12 (twelve) hours.     aspirin EC 325 MG tablet  Take 1 tablet (325 mg total) by mouth 2 (two) times daily.     bumetanide 1 MG tablet  Commonly known as:  BUMEX  Take 2 mg by mouth 2 (two) times daily.     clotrimazole-betamethasone cream  Commonly known as:  LOTRISONE  Apply 1  application topically as needed (for peeling hands). Apply to hands     cyclobenzaprine 10 MG tablet  Commonly known as:  FLEXERIL  Take 1 tablet (10 mg total) by mouth 3 (three) times daily as needed for muscle spasms. Muscle spasm, scheduled dose     DSS 100 MG Caps  Take 100 mg by mouth 2 (two) times daily.     escitalopram 10 MG tablet  Commonly known as:  LEXAPRO  Take 10 mg by mouth every evening.     esomeprazole 40 MG capsule  Commonly  known as:  NEXIUM  Take 40 mg by mouth 2 (two) times daily.     estradiol 2 MG tablet  Commonly known as:  ESTRACE  Take 2 mg by mouth daily.     ferrous sulfate 325 (65 FE) MG tablet  Take 1 tablet (325 mg total) by mouth 3 (three) times daily after meals.     fluticasone 110 MCG/ACT inhaler  Commonly known as:  FLOVENT HFA  Inhale 1 puff into the lungs 2 (two) times daily.     fluticasone 50 MCG/ACT nasal spray  Commonly known as:  FLONASE  Place 2 sprays into the nose at bedtime as needed for allergies.     hydrocortisone 2.5 % rectal cream  Commonly known as:  ANUSOL-HC  Place 1 application rectally 2 (two) times daily as needed. For pain due to hemmorroids     lidocaine 5 %  Commonly known as:  LIDODERM  - Place 1-3 patches onto the skin daily as needed. Remove & Discard patch within 12 hours or as directed by MD  - For pain     lidocaine 5 % ointment  Commonly known as:  XYLOCAINE  Apply 1 application topically daily. To hemmorroids     magnesium oxide 400 (241.3 MG) MG tablet  Commonly known as:  MAG-OX  Take 1 tablet (400 mg total) by mouth daily.     metFORMIN 500 MG tablet  Commonly known as:  GLUCOPHAGE  Take 500 mg by mouth 2 (two) times daily with a meal.     metolazone 10 MG tablet  Commonly known as:  ZAROXOLYN  Take 10-20 mg by mouth daily as needed (for excessive edema).     nitroGLYCERIN 0.4 MG SL tablet  Commonly known as:  NITROSTAT  Place 0.4 mg under the tongue every 5 (five) minutes as needed  for chest pain.     nystatin 100000 UNIT/GM Powd  Apply 1 g topically 3 (three) times daily as needed. To rashy areas     olopatadine 0.1 % ophthalmic solution  Commonly known as:  PATANOL  Place 1 drop into both eyes as needed for allergies.     Oxycodone HCl 10 MG Tabs  Take 1-2 tablets (10-20 mg total) by mouth every 4 (four) hours as needed for pain.     oxyCODONE 20 MG 12 hr tablet  Commonly known as:  OXYCONTIN  Take 1 tablet (20 mg total) by mouth every 12 (twelve) hours.     PHAZYME 180 MG Caps  Generic drug:  Simethicone  Take 1 capsule by mouth 4 (four) times daily as needed. For gas/bloating     polyethylene glycol packet  Commonly known as:  MIRALAX / GLYCOLAX  Take 17 g by mouth 2 (two) times daily as needed (for constipation).     polyethylene glycol packet  Commonly known as:  MIRALAX / GLYCOLAX  Take 17 g by mouth 2 (two) times daily.     potassium chloride 10 MEQ tablet  Commonly known as:  K-DUR  Take 30 mEq by mouth 2 (two) times daily.     promethazine 25 MG tablet  Commonly known as:  PHENERGAN  Take 25 mg by mouth every 6 (six) hours as needed. nausea     simvastatin 20 MG tablet  Commonly known as:  ZOCOR  Take 20 mg by mouth at bedtime.         Signed: Anastasio Auerbach. Robynn Marcel   PAC  03/21/2013, 9:27 AM

## 2013-03-21 NOTE — Progress Notes (Signed)
   Subjective: 7 Days Post-Op Procedure(s) (LRB): IRRIGATION AND DEBRIDEMENT  and Closure of wound left KNEE (Left)   Patient reports pain as mild, pain well controlled. No events throughout the night. Ready to be discharged to SNF.  Objective:   VITALS:   Filed Vitals:   03/21/13 0604  BP: 135/85  Pulse: 92  Temp: 98.5 F (36.9 C)  Resp: 16    Neurovascular intact Dorsiflexion/Plantar flexion intact Incision: dressing C/D/I No cellulitis present Compartment soft  LABS  Recent Labs  03/19/13 0443  HGB 10.2*  HCT 32.3*  WBC 8.7  PLT 224     Recent Labs  03/19/13 0443  NA 135  K 4.1  BUN 10  CREATININE 0.66  GLUCOSE 173*     Assessment/Plan: 7 Days Post-Op Procedure(s) (LRB): IRRIGATION AND DEBRIDEMENT  and Closure of wound left KNEE (Left) Up with therapy Discharge to SNF Daily dressing changes with loose Aquacel on the medial aspect and Xeroform on the lateral aspect of the incision. Follow up in 1 weeks at [facility]. Follow up with OLIN,Syndey Jaskolski D in 1 weeks.  Contact information:  [facility] [address], [address] [place] [address] [phone]        [name]. [name]   PAC  03/21/2013, 9:10 AM

## 2013-03-25 DIAGNOSIS — T8189XA Other complications of procedures, not elsewhere classified, initial encounter: Secondary | ICD-10-CM | POA: Diagnosis not present

## 2013-03-29 DIAGNOSIS — Z96659 Presence of unspecified artificial knee joint: Secondary | ICD-10-CM | POA: Diagnosis not present

## 2013-03-29 DIAGNOSIS — J449 Chronic obstructive pulmonary disease, unspecified: Secondary | ICD-10-CM | POA: Diagnosis not present

## 2013-03-29 DIAGNOSIS — Z471 Aftercare following joint replacement surgery: Secondary | ICD-10-CM | POA: Diagnosis not present

## 2013-03-29 DIAGNOSIS — F329 Major depressive disorder, single episode, unspecified: Secondary | ICD-10-CM | POA: Diagnosis not present

## 2013-03-29 DIAGNOSIS — E119 Type 2 diabetes mellitus without complications: Secondary | ICD-10-CM | POA: Diagnosis not present

## 2013-03-30 DIAGNOSIS — F329 Major depressive disorder, single episode, unspecified: Secondary | ICD-10-CM | POA: Diagnosis not present

## 2013-03-30 DIAGNOSIS — Z471 Aftercare following joint replacement surgery: Secondary | ICD-10-CM | POA: Diagnosis not present

## 2013-03-30 DIAGNOSIS — J449 Chronic obstructive pulmonary disease, unspecified: Secondary | ICD-10-CM | POA: Diagnosis not present

## 2013-03-30 DIAGNOSIS — E119 Type 2 diabetes mellitus without complications: Secondary | ICD-10-CM | POA: Diagnosis not present

## 2013-03-30 DIAGNOSIS — Z96659 Presence of unspecified artificial knee joint: Secondary | ICD-10-CM | POA: Diagnosis not present

## 2013-03-31 DIAGNOSIS — Z96659 Presence of unspecified artificial knee joint: Secondary | ICD-10-CM | POA: Diagnosis not present

## 2013-04-01 DIAGNOSIS — Z96659 Presence of unspecified artificial knee joint: Secondary | ICD-10-CM | POA: Diagnosis not present

## 2013-04-01 DIAGNOSIS — E119 Type 2 diabetes mellitus without complications: Secondary | ICD-10-CM | POA: Diagnosis not present

## 2013-04-01 DIAGNOSIS — J449 Chronic obstructive pulmonary disease, unspecified: Secondary | ICD-10-CM | POA: Diagnosis not present

## 2013-04-01 DIAGNOSIS — Z471 Aftercare following joint replacement surgery: Secondary | ICD-10-CM | POA: Diagnosis not present

## 2013-04-01 DIAGNOSIS — F329 Major depressive disorder, single episode, unspecified: Secondary | ICD-10-CM | POA: Diagnosis not present

## 2013-04-04 DIAGNOSIS — Z96659 Presence of unspecified artificial knee joint: Secondary | ICD-10-CM | POA: Diagnosis not present

## 2013-04-04 DIAGNOSIS — Z471 Aftercare following joint replacement surgery: Secondary | ICD-10-CM | POA: Diagnosis not present

## 2013-04-04 DIAGNOSIS — F329 Major depressive disorder, single episode, unspecified: Secondary | ICD-10-CM | POA: Diagnosis not present

## 2013-04-04 DIAGNOSIS — J449 Chronic obstructive pulmonary disease, unspecified: Secondary | ICD-10-CM | POA: Diagnosis not present

## 2013-04-04 DIAGNOSIS — E119 Type 2 diabetes mellitus without complications: Secondary | ICD-10-CM | POA: Diagnosis not present

## 2013-04-06 DIAGNOSIS — F329 Major depressive disorder, single episode, unspecified: Secondary | ICD-10-CM | POA: Diagnosis not present

## 2013-04-06 DIAGNOSIS — Z96659 Presence of unspecified artificial knee joint: Secondary | ICD-10-CM | POA: Diagnosis not present

## 2013-04-06 DIAGNOSIS — J449 Chronic obstructive pulmonary disease, unspecified: Secondary | ICD-10-CM | POA: Diagnosis not present

## 2013-04-06 DIAGNOSIS — E119 Type 2 diabetes mellitus without complications: Secondary | ICD-10-CM | POA: Diagnosis not present

## 2013-04-06 DIAGNOSIS — Z471 Aftercare following joint replacement surgery: Secondary | ICD-10-CM | POA: Diagnosis not present

## 2013-04-07 DIAGNOSIS — Z96659 Presence of unspecified artificial knee joint: Secondary | ICD-10-CM | POA: Diagnosis not present

## 2013-04-08 DIAGNOSIS — E119 Type 2 diabetes mellitus without complications: Secondary | ICD-10-CM | POA: Diagnosis not present

## 2013-04-08 DIAGNOSIS — Z471 Aftercare following joint replacement surgery: Secondary | ICD-10-CM | POA: Diagnosis not present

## 2013-04-08 DIAGNOSIS — F329 Major depressive disorder, single episode, unspecified: Secondary | ICD-10-CM | POA: Diagnosis not present

## 2013-04-08 DIAGNOSIS — J449 Chronic obstructive pulmonary disease, unspecified: Secondary | ICD-10-CM | POA: Diagnosis not present

## 2013-04-08 DIAGNOSIS — Z96659 Presence of unspecified artificial knee joint: Secondary | ICD-10-CM | POA: Diagnosis not present

## 2013-04-09 DIAGNOSIS — J449 Chronic obstructive pulmonary disease, unspecified: Secondary | ICD-10-CM | POA: Diagnosis not present

## 2013-04-09 DIAGNOSIS — Z471 Aftercare following joint replacement surgery: Secondary | ICD-10-CM | POA: Diagnosis not present

## 2013-04-09 DIAGNOSIS — E119 Type 2 diabetes mellitus without complications: Secondary | ICD-10-CM | POA: Diagnosis not present

## 2013-04-09 DIAGNOSIS — Z96659 Presence of unspecified artificial knee joint: Secondary | ICD-10-CM | POA: Diagnosis not present

## 2013-04-09 DIAGNOSIS — F329 Major depressive disorder, single episode, unspecified: Secondary | ICD-10-CM | POA: Diagnosis not present

## 2013-04-10 DIAGNOSIS — E119 Type 2 diabetes mellitus without complications: Secondary | ICD-10-CM | POA: Diagnosis not present

## 2013-04-10 DIAGNOSIS — Z96659 Presence of unspecified artificial knee joint: Secondary | ICD-10-CM | POA: Diagnosis not present

## 2013-04-10 DIAGNOSIS — J449 Chronic obstructive pulmonary disease, unspecified: Secondary | ICD-10-CM | POA: Diagnosis not present

## 2013-04-10 DIAGNOSIS — F329 Major depressive disorder, single episode, unspecified: Secondary | ICD-10-CM | POA: Diagnosis not present

## 2013-04-10 DIAGNOSIS — Z471 Aftercare following joint replacement surgery: Secondary | ICD-10-CM | POA: Diagnosis not present

## 2013-04-11 DIAGNOSIS — Z471 Aftercare following joint replacement surgery: Secondary | ICD-10-CM | POA: Diagnosis not present

## 2013-04-11 DIAGNOSIS — E119 Type 2 diabetes mellitus without complications: Secondary | ICD-10-CM | POA: Diagnosis not present

## 2013-04-11 DIAGNOSIS — Z96659 Presence of unspecified artificial knee joint: Secondary | ICD-10-CM | POA: Diagnosis not present

## 2013-04-11 DIAGNOSIS — F329 Major depressive disorder, single episode, unspecified: Secondary | ICD-10-CM | POA: Diagnosis not present

## 2013-04-11 DIAGNOSIS — J449 Chronic obstructive pulmonary disease, unspecified: Secondary | ICD-10-CM | POA: Diagnosis not present

## 2013-04-12 DIAGNOSIS — E119 Type 2 diabetes mellitus without complications: Secondary | ICD-10-CM | POA: Diagnosis not present

## 2013-04-12 DIAGNOSIS — Z471 Aftercare following joint replacement surgery: Secondary | ICD-10-CM | POA: Diagnosis not present

## 2013-04-12 DIAGNOSIS — F329 Major depressive disorder, single episode, unspecified: Secondary | ICD-10-CM | POA: Diagnosis not present

## 2013-04-12 DIAGNOSIS — J449 Chronic obstructive pulmonary disease, unspecified: Secondary | ICD-10-CM | POA: Diagnosis not present

## 2013-04-12 DIAGNOSIS — Z96659 Presence of unspecified artificial knee joint: Secondary | ICD-10-CM | POA: Diagnosis not present

## 2013-04-13 DIAGNOSIS — Z471 Aftercare following joint replacement surgery: Secondary | ICD-10-CM | POA: Diagnosis not present

## 2013-04-13 DIAGNOSIS — Z96659 Presence of unspecified artificial knee joint: Secondary | ICD-10-CM | POA: Diagnosis not present

## 2013-04-13 DIAGNOSIS — J449 Chronic obstructive pulmonary disease, unspecified: Secondary | ICD-10-CM | POA: Diagnosis not present

## 2013-04-13 DIAGNOSIS — E119 Type 2 diabetes mellitus without complications: Secondary | ICD-10-CM | POA: Diagnosis not present

## 2013-04-13 DIAGNOSIS — F329 Major depressive disorder, single episode, unspecified: Secondary | ICD-10-CM | POA: Diagnosis not present

## 2013-04-14 DIAGNOSIS — Z96659 Presence of unspecified artificial knee joint: Secondary | ICD-10-CM | POA: Diagnosis not present

## 2013-04-14 DIAGNOSIS — E119 Type 2 diabetes mellitus without complications: Secondary | ICD-10-CM | POA: Diagnosis not present

## 2013-04-14 DIAGNOSIS — J449 Chronic obstructive pulmonary disease, unspecified: Secondary | ICD-10-CM | POA: Diagnosis not present

## 2013-04-14 DIAGNOSIS — Z471 Aftercare following joint replacement surgery: Secondary | ICD-10-CM | POA: Diagnosis not present

## 2013-04-14 DIAGNOSIS — F329 Major depressive disorder, single episode, unspecified: Secondary | ICD-10-CM | POA: Diagnosis not present

## 2013-04-15 DIAGNOSIS — E119 Type 2 diabetes mellitus without complications: Secondary | ICD-10-CM | POA: Diagnosis not present

## 2013-04-15 DIAGNOSIS — J449 Chronic obstructive pulmonary disease, unspecified: Secondary | ICD-10-CM | POA: Diagnosis not present

## 2013-04-15 DIAGNOSIS — Z471 Aftercare following joint replacement surgery: Secondary | ICD-10-CM | POA: Diagnosis not present

## 2013-04-15 DIAGNOSIS — F329 Major depressive disorder, single episode, unspecified: Secondary | ICD-10-CM | POA: Diagnosis not present

## 2013-04-15 DIAGNOSIS — Z96659 Presence of unspecified artificial knee joint: Secondary | ICD-10-CM | POA: Diagnosis not present

## 2013-04-16 DIAGNOSIS — Z471 Aftercare following joint replacement surgery: Secondary | ICD-10-CM | POA: Diagnosis not present

## 2013-04-16 DIAGNOSIS — F329 Major depressive disorder, single episode, unspecified: Secondary | ICD-10-CM | POA: Diagnosis not present

## 2013-04-16 DIAGNOSIS — E119 Type 2 diabetes mellitus without complications: Secondary | ICD-10-CM | POA: Diagnosis not present

## 2013-04-16 DIAGNOSIS — J449 Chronic obstructive pulmonary disease, unspecified: Secondary | ICD-10-CM | POA: Diagnosis not present

## 2013-04-16 DIAGNOSIS — Z96659 Presence of unspecified artificial knee joint: Secondary | ICD-10-CM | POA: Diagnosis not present

## 2013-04-17 DIAGNOSIS — F329 Major depressive disorder, single episode, unspecified: Secondary | ICD-10-CM | POA: Diagnosis not present

## 2013-04-17 DIAGNOSIS — Z471 Aftercare following joint replacement surgery: Secondary | ICD-10-CM | POA: Diagnosis not present

## 2013-04-17 DIAGNOSIS — J449 Chronic obstructive pulmonary disease, unspecified: Secondary | ICD-10-CM | POA: Diagnosis not present

## 2013-04-17 DIAGNOSIS — Z96659 Presence of unspecified artificial knee joint: Secondary | ICD-10-CM | POA: Diagnosis not present

## 2013-04-17 DIAGNOSIS — E119 Type 2 diabetes mellitus without complications: Secondary | ICD-10-CM | POA: Diagnosis not present

## 2013-04-18 DIAGNOSIS — Z471 Aftercare following joint replacement surgery: Secondary | ICD-10-CM | POA: Diagnosis not present

## 2013-04-18 DIAGNOSIS — J449 Chronic obstructive pulmonary disease, unspecified: Secondary | ICD-10-CM | POA: Diagnosis not present

## 2013-04-18 DIAGNOSIS — F329 Major depressive disorder, single episode, unspecified: Secondary | ICD-10-CM | POA: Diagnosis not present

## 2013-04-18 DIAGNOSIS — E119 Type 2 diabetes mellitus without complications: Secondary | ICD-10-CM | POA: Diagnosis not present

## 2013-04-18 DIAGNOSIS — Z96659 Presence of unspecified artificial knee joint: Secondary | ICD-10-CM | POA: Diagnosis not present

## 2013-04-19 DIAGNOSIS — F329 Major depressive disorder, single episode, unspecified: Secondary | ICD-10-CM | POA: Diagnosis not present

## 2013-04-19 DIAGNOSIS — J449 Chronic obstructive pulmonary disease, unspecified: Secondary | ICD-10-CM | POA: Diagnosis not present

## 2013-04-19 DIAGNOSIS — E119 Type 2 diabetes mellitus without complications: Secondary | ICD-10-CM | POA: Diagnosis not present

## 2013-04-19 DIAGNOSIS — Z471 Aftercare following joint replacement surgery: Secondary | ICD-10-CM | POA: Diagnosis not present

## 2013-04-19 DIAGNOSIS — Z96659 Presence of unspecified artificial knee joint: Secondary | ICD-10-CM | POA: Diagnosis not present

## 2013-04-20 DIAGNOSIS — Z471 Aftercare following joint replacement surgery: Secondary | ICD-10-CM | POA: Diagnosis not present

## 2013-04-20 DIAGNOSIS — F329 Major depressive disorder, single episode, unspecified: Secondary | ICD-10-CM | POA: Diagnosis not present

## 2013-04-20 DIAGNOSIS — E119 Type 2 diabetes mellitus without complications: Secondary | ICD-10-CM | POA: Diagnosis not present

## 2013-04-20 DIAGNOSIS — J449 Chronic obstructive pulmonary disease, unspecified: Secondary | ICD-10-CM | POA: Diagnosis not present

## 2013-04-20 DIAGNOSIS — Z96659 Presence of unspecified artificial knee joint: Secondary | ICD-10-CM | POA: Diagnosis not present

## 2013-04-21 DIAGNOSIS — Z96659 Presence of unspecified artificial knee joint: Secondary | ICD-10-CM | POA: Diagnosis not present

## 2013-04-21 DIAGNOSIS — F329 Major depressive disorder, single episode, unspecified: Secondary | ICD-10-CM | POA: Diagnosis not present

## 2013-04-21 DIAGNOSIS — E119 Type 2 diabetes mellitus without complications: Secondary | ICD-10-CM | POA: Diagnosis not present

## 2013-04-21 DIAGNOSIS — Z471 Aftercare following joint replacement surgery: Secondary | ICD-10-CM | POA: Diagnosis not present

## 2013-04-21 DIAGNOSIS — J449 Chronic obstructive pulmonary disease, unspecified: Secondary | ICD-10-CM | POA: Diagnosis not present

## 2013-04-22 DIAGNOSIS — Z96659 Presence of unspecified artificial knee joint: Secondary | ICD-10-CM | POA: Diagnosis not present

## 2013-04-23 DIAGNOSIS — Z471 Aftercare following joint replacement surgery: Secondary | ICD-10-CM | POA: Diagnosis not present

## 2013-04-23 DIAGNOSIS — Z96659 Presence of unspecified artificial knee joint: Secondary | ICD-10-CM | POA: Diagnosis not present

## 2013-04-23 DIAGNOSIS — E119 Type 2 diabetes mellitus without complications: Secondary | ICD-10-CM | POA: Diagnosis not present

## 2013-04-23 DIAGNOSIS — F329 Major depressive disorder, single episode, unspecified: Secondary | ICD-10-CM | POA: Diagnosis not present

## 2013-04-23 DIAGNOSIS — J449 Chronic obstructive pulmonary disease, unspecified: Secondary | ICD-10-CM | POA: Diagnosis not present

## 2013-04-24 DIAGNOSIS — E119 Type 2 diabetes mellitus without complications: Secondary | ICD-10-CM | POA: Diagnosis not present

## 2013-04-24 DIAGNOSIS — J449 Chronic obstructive pulmonary disease, unspecified: Secondary | ICD-10-CM | POA: Diagnosis not present

## 2013-04-24 DIAGNOSIS — F329 Major depressive disorder, single episode, unspecified: Secondary | ICD-10-CM | POA: Diagnosis not present

## 2013-04-24 DIAGNOSIS — Z471 Aftercare following joint replacement surgery: Secondary | ICD-10-CM | POA: Diagnosis not present

## 2013-04-24 DIAGNOSIS — Z96659 Presence of unspecified artificial knee joint: Secondary | ICD-10-CM | POA: Diagnosis not present

## 2013-04-25 DIAGNOSIS — J449 Chronic obstructive pulmonary disease, unspecified: Secondary | ICD-10-CM | POA: Diagnosis not present

## 2013-04-25 DIAGNOSIS — F329 Major depressive disorder, single episode, unspecified: Secondary | ICD-10-CM | POA: Diagnosis not present

## 2013-04-25 DIAGNOSIS — Z471 Aftercare following joint replacement surgery: Secondary | ICD-10-CM | POA: Diagnosis not present

## 2013-04-25 DIAGNOSIS — Z96659 Presence of unspecified artificial knee joint: Secondary | ICD-10-CM | POA: Diagnosis not present

## 2013-04-25 DIAGNOSIS — E119 Type 2 diabetes mellitus without complications: Secondary | ICD-10-CM | POA: Diagnosis not present

## 2013-04-26 DIAGNOSIS — Z471 Aftercare following joint replacement surgery: Secondary | ICD-10-CM | POA: Diagnosis not present

## 2013-04-26 DIAGNOSIS — Z96659 Presence of unspecified artificial knee joint: Secondary | ICD-10-CM | POA: Diagnosis not present

## 2013-04-26 DIAGNOSIS — J449 Chronic obstructive pulmonary disease, unspecified: Secondary | ICD-10-CM | POA: Diagnosis not present

## 2013-04-26 DIAGNOSIS — E119 Type 2 diabetes mellitus without complications: Secondary | ICD-10-CM | POA: Diagnosis not present

## 2013-04-26 DIAGNOSIS — F329 Major depressive disorder, single episode, unspecified: Secondary | ICD-10-CM | POA: Diagnosis not present

## 2013-04-27 DIAGNOSIS — Z96659 Presence of unspecified artificial knee joint: Secondary | ICD-10-CM | POA: Diagnosis not present

## 2013-04-27 DIAGNOSIS — J449 Chronic obstructive pulmonary disease, unspecified: Secondary | ICD-10-CM | POA: Diagnosis not present

## 2013-04-27 DIAGNOSIS — E119 Type 2 diabetes mellitus without complications: Secondary | ICD-10-CM | POA: Diagnosis not present

## 2013-04-27 DIAGNOSIS — F329 Major depressive disorder, single episode, unspecified: Secondary | ICD-10-CM | POA: Diagnosis not present

## 2013-04-27 DIAGNOSIS — Z471 Aftercare following joint replacement surgery: Secondary | ICD-10-CM | POA: Diagnosis not present

## 2013-04-28 DIAGNOSIS — Z96659 Presence of unspecified artificial knee joint: Secondary | ICD-10-CM | POA: Diagnosis not present

## 2013-04-29 DIAGNOSIS — E119 Type 2 diabetes mellitus without complications: Secondary | ICD-10-CM | POA: Diagnosis not present

## 2013-04-29 DIAGNOSIS — J449 Chronic obstructive pulmonary disease, unspecified: Secondary | ICD-10-CM | POA: Diagnosis not present

## 2013-04-29 DIAGNOSIS — Z96659 Presence of unspecified artificial knee joint: Secondary | ICD-10-CM | POA: Diagnosis not present

## 2013-04-29 DIAGNOSIS — F329 Major depressive disorder, single episode, unspecified: Secondary | ICD-10-CM | POA: Diagnosis not present

## 2013-04-29 DIAGNOSIS — Z471 Aftercare following joint replacement surgery: Secondary | ICD-10-CM | POA: Diagnosis not present

## 2013-04-30 DIAGNOSIS — Z471 Aftercare following joint replacement surgery: Secondary | ICD-10-CM | POA: Diagnosis not present

## 2013-04-30 DIAGNOSIS — F329 Major depressive disorder, single episode, unspecified: Secondary | ICD-10-CM | POA: Diagnosis not present

## 2013-04-30 DIAGNOSIS — E119 Type 2 diabetes mellitus without complications: Secondary | ICD-10-CM | POA: Diagnosis not present

## 2013-04-30 DIAGNOSIS — J449 Chronic obstructive pulmonary disease, unspecified: Secondary | ICD-10-CM | POA: Diagnosis not present

## 2013-04-30 DIAGNOSIS — Z96659 Presence of unspecified artificial knee joint: Secondary | ICD-10-CM | POA: Diagnosis not present

## 2013-05-01 DIAGNOSIS — J449 Chronic obstructive pulmonary disease, unspecified: Secondary | ICD-10-CM | POA: Diagnosis not present

## 2013-05-01 DIAGNOSIS — Z471 Aftercare following joint replacement surgery: Secondary | ICD-10-CM | POA: Diagnosis not present

## 2013-05-01 DIAGNOSIS — Z96659 Presence of unspecified artificial knee joint: Secondary | ICD-10-CM | POA: Diagnosis not present

## 2013-05-01 DIAGNOSIS — F329 Major depressive disorder, single episode, unspecified: Secondary | ICD-10-CM | POA: Diagnosis not present

## 2013-05-01 DIAGNOSIS — E119 Type 2 diabetes mellitus without complications: Secondary | ICD-10-CM | POA: Diagnosis not present

## 2013-05-02 DIAGNOSIS — Z96659 Presence of unspecified artificial knee joint: Secondary | ICD-10-CM | POA: Diagnosis not present

## 2013-05-02 DIAGNOSIS — E119 Type 2 diabetes mellitus without complications: Secondary | ICD-10-CM | POA: Diagnosis not present

## 2013-05-02 DIAGNOSIS — F329 Major depressive disorder, single episode, unspecified: Secondary | ICD-10-CM | POA: Diagnosis not present

## 2013-05-02 DIAGNOSIS — J449 Chronic obstructive pulmonary disease, unspecified: Secondary | ICD-10-CM | POA: Diagnosis not present

## 2013-05-02 DIAGNOSIS — Z471 Aftercare following joint replacement surgery: Secondary | ICD-10-CM | POA: Diagnosis not present

## 2013-05-03 DIAGNOSIS — J449 Chronic obstructive pulmonary disease, unspecified: Secondary | ICD-10-CM | POA: Diagnosis not present

## 2013-05-03 DIAGNOSIS — E119 Type 2 diabetes mellitus without complications: Secondary | ICD-10-CM | POA: Diagnosis not present

## 2013-05-03 DIAGNOSIS — Z471 Aftercare following joint replacement surgery: Secondary | ICD-10-CM | POA: Diagnosis not present

## 2013-05-03 DIAGNOSIS — Z96659 Presence of unspecified artificial knee joint: Secondary | ICD-10-CM | POA: Diagnosis not present

## 2013-05-03 DIAGNOSIS — F329 Major depressive disorder, single episode, unspecified: Secondary | ICD-10-CM | POA: Diagnosis not present

## 2013-05-04 DIAGNOSIS — Z96659 Presence of unspecified artificial knee joint: Secondary | ICD-10-CM | POA: Diagnosis not present

## 2013-05-04 DIAGNOSIS — J449 Chronic obstructive pulmonary disease, unspecified: Secondary | ICD-10-CM | POA: Diagnosis not present

## 2013-05-04 DIAGNOSIS — Z471 Aftercare following joint replacement surgery: Secondary | ICD-10-CM | POA: Diagnosis not present

## 2013-05-04 DIAGNOSIS — F329 Major depressive disorder, single episode, unspecified: Secondary | ICD-10-CM | POA: Diagnosis not present

## 2013-05-04 DIAGNOSIS — E119 Type 2 diabetes mellitus without complications: Secondary | ICD-10-CM | POA: Diagnosis not present

## 2013-05-05 DIAGNOSIS — F329 Major depressive disorder, single episode, unspecified: Secondary | ICD-10-CM | POA: Diagnosis not present

## 2013-05-05 DIAGNOSIS — Z471 Aftercare following joint replacement surgery: Secondary | ICD-10-CM | POA: Diagnosis not present

## 2013-05-05 DIAGNOSIS — Z96659 Presence of unspecified artificial knee joint: Secondary | ICD-10-CM | POA: Diagnosis not present

## 2013-05-05 DIAGNOSIS — J449 Chronic obstructive pulmonary disease, unspecified: Secondary | ICD-10-CM | POA: Diagnosis not present

## 2013-05-05 DIAGNOSIS — E119 Type 2 diabetes mellitus without complications: Secondary | ICD-10-CM | POA: Diagnosis not present

## 2013-05-06 DIAGNOSIS — F329 Major depressive disorder, single episode, unspecified: Secondary | ICD-10-CM | POA: Diagnosis not present

## 2013-05-06 DIAGNOSIS — Z471 Aftercare following joint replacement surgery: Secondary | ICD-10-CM | POA: Diagnosis not present

## 2013-05-06 DIAGNOSIS — J449 Chronic obstructive pulmonary disease, unspecified: Secondary | ICD-10-CM | POA: Diagnosis not present

## 2013-05-06 DIAGNOSIS — Z96659 Presence of unspecified artificial knee joint: Secondary | ICD-10-CM | POA: Diagnosis not present

## 2013-05-06 DIAGNOSIS — E119 Type 2 diabetes mellitus without complications: Secondary | ICD-10-CM | POA: Diagnosis not present

## 2013-05-07 DIAGNOSIS — Z471 Aftercare following joint replacement surgery: Secondary | ICD-10-CM | POA: Diagnosis not present

## 2013-05-07 DIAGNOSIS — E119 Type 2 diabetes mellitus without complications: Secondary | ICD-10-CM | POA: Diagnosis not present

## 2013-05-07 DIAGNOSIS — Z96659 Presence of unspecified artificial knee joint: Secondary | ICD-10-CM | POA: Diagnosis not present

## 2013-05-07 DIAGNOSIS — F329 Major depressive disorder, single episode, unspecified: Secondary | ICD-10-CM | POA: Diagnosis not present

## 2013-05-07 DIAGNOSIS — J449 Chronic obstructive pulmonary disease, unspecified: Secondary | ICD-10-CM | POA: Diagnosis not present

## 2013-05-08 DIAGNOSIS — Z96659 Presence of unspecified artificial knee joint: Secondary | ICD-10-CM | POA: Diagnosis not present

## 2013-05-08 DIAGNOSIS — J449 Chronic obstructive pulmonary disease, unspecified: Secondary | ICD-10-CM | POA: Diagnosis not present

## 2013-05-08 DIAGNOSIS — Z471 Aftercare following joint replacement surgery: Secondary | ICD-10-CM | POA: Diagnosis not present

## 2013-05-08 DIAGNOSIS — E119 Type 2 diabetes mellitus without complications: Secondary | ICD-10-CM | POA: Diagnosis not present

## 2013-05-08 DIAGNOSIS — F329 Major depressive disorder, single episode, unspecified: Secondary | ICD-10-CM | POA: Diagnosis not present

## 2013-05-09 DIAGNOSIS — E119 Type 2 diabetes mellitus without complications: Secondary | ICD-10-CM | POA: Diagnosis not present

## 2013-05-09 DIAGNOSIS — T8140XA Infection following a procedure, unspecified, initial encounter: Secondary | ICD-10-CM | POA: Diagnosis not present

## 2013-05-09 DIAGNOSIS — T8132XA Disruption of internal operation (surgical) wound, not elsewhere classified, initial encounter: Secondary | ICD-10-CM | POA: Diagnosis not present

## 2013-05-09 DIAGNOSIS — J449 Chronic obstructive pulmonary disease, unspecified: Secondary | ICD-10-CM | POA: Diagnosis not present

## 2013-05-09 DIAGNOSIS — D649 Anemia, unspecified: Secondary | ICD-10-CM | POA: Diagnosis not present

## 2013-05-10 DIAGNOSIS — E119 Type 2 diabetes mellitus without complications: Secondary | ICD-10-CM | POA: Diagnosis not present

## 2013-05-10 DIAGNOSIS — T8132XA Disruption of internal operation (surgical) wound, not elsewhere classified, initial encounter: Secondary | ICD-10-CM | POA: Diagnosis not present

## 2013-05-10 DIAGNOSIS — J449 Chronic obstructive pulmonary disease, unspecified: Secondary | ICD-10-CM | POA: Diagnosis not present

## 2013-05-10 DIAGNOSIS — T8140XA Infection following a procedure, unspecified, initial encounter: Secondary | ICD-10-CM | POA: Diagnosis not present

## 2013-05-10 DIAGNOSIS — D649 Anemia, unspecified: Secondary | ICD-10-CM | POA: Diagnosis not present

## 2013-05-12 DIAGNOSIS — E119 Type 2 diabetes mellitus without complications: Secondary | ICD-10-CM | POA: Diagnosis not present

## 2013-05-12 DIAGNOSIS — D649 Anemia, unspecified: Secondary | ICD-10-CM | POA: Diagnosis not present

## 2013-05-12 DIAGNOSIS — J449 Chronic obstructive pulmonary disease, unspecified: Secondary | ICD-10-CM | POA: Diagnosis not present

## 2013-05-12 DIAGNOSIS — T8140XA Infection following a procedure, unspecified, initial encounter: Secondary | ICD-10-CM | POA: Diagnosis not present

## 2013-05-12 DIAGNOSIS — T8132XA Disruption of internal operation (surgical) wound, not elsewhere classified, initial encounter: Secondary | ICD-10-CM | POA: Diagnosis not present

## 2013-05-13 DIAGNOSIS — J449 Chronic obstructive pulmonary disease, unspecified: Secondary | ICD-10-CM | POA: Diagnosis not present

## 2013-05-13 DIAGNOSIS — T8140XA Infection following a procedure, unspecified, initial encounter: Secondary | ICD-10-CM | POA: Diagnosis not present

## 2013-05-13 DIAGNOSIS — T8132XA Disruption of internal operation (surgical) wound, not elsewhere classified, initial encounter: Secondary | ICD-10-CM | POA: Diagnosis not present

## 2013-05-13 DIAGNOSIS — E119 Type 2 diabetes mellitus without complications: Secondary | ICD-10-CM | POA: Diagnosis not present

## 2013-05-13 DIAGNOSIS — D649 Anemia, unspecified: Secondary | ICD-10-CM | POA: Diagnosis not present

## 2013-05-14 DIAGNOSIS — T8140XA Infection following a procedure, unspecified, initial encounter: Secondary | ICD-10-CM | POA: Diagnosis not present

## 2013-05-14 DIAGNOSIS — E119 Type 2 diabetes mellitus without complications: Secondary | ICD-10-CM | POA: Diagnosis not present

## 2013-05-14 DIAGNOSIS — T8132XA Disruption of internal operation (surgical) wound, not elsewhere classified, initial encounter: Secondary | ICD-10-CM | POA: Diagnosis not present

## 2013-05-14 DIAGNOSIS — D649 Anemia, unspecified: Secondary | ICD-10-CM | POA: Diagnosis not present

## 2013-05-14 DIAGNOSIS — J449 Chronic obstructive pulmonary disease, unspecified: Secondary | ICD-10-CM | POA: Diagnosis not present

## 2013-05-15 DIAGNOSIS — E119 Type 2 diabetes mellitus without complications: Secondary | ICD-10-CM | POA: Diagnosis not present

## 2013-05-15 DIAGNOSIS — D649 Anemia, unspecified: Secondary | ICD-10-CM | POA: Diagnosis not present

## 2013-05-15 DIAGNOSIS — T8140XA Infection following a procedure, unspecified, initial encounter: Secondary | ICD-10-CM | POA: Diagnosis not present

## 2013-05-15 DIAGNOSIS — J449 Chronic obstructive pulmonary disease, unspecified: Secondary | ICD-10-CM | POA: Diagnosis not present

## 2013-05-15 DIAGNOSIS — T8132XA Disruption of internal operation (surgical) wound, not elsewhere classified, initial encounter: Secondary | ICD-10-CM | POA: Diagnosis not present

## 2013-05-16 DIAGNOSIS — D649 Anemia, unspecified: Secondary | ICD-10-CM | POA: Diagnosis not present

## 2013-05-16 DIAGNOSIS — J449 Chronic obstructive pulmonary disease, unspecified: Secondary | ICD-10-CM | POA: Diagnosis not present

## 2013-05-16 DIAGNOSIS — T8140XA Infection following a procedure, unspecified, initial encounter: Secondary | ICD-10-CM | POA: Diagnosis not present

## 2013-05-16 DIAGNOSIS — T8132XA Disruption of internal operation (surgical) wound, not elsewhere classified, initial encounter: Secondary | ICD-10-CM | POA: Diagnosis not present

## 2013-05-16 DIAGNOSIS — E119 Type 2 diabetes mellitus without complications: Secondary | ICD-10-CM | POA: Diagnosis not present

## 2013-05-17 DIAGNOSIS — E119 Type 2 diabetes mellitus without complications: Secondary | ICD-10-CM | POA: Diagnosis not present

## 2013-05-17 DIAGNOSIS — D649 Anemia, unspecified: Secondary | ICD-10-CM | POA: Diagnosis not present

## 2013-05-17 DIAGNOSIS — T8140XA Infection following a procedure, unspecified, initial encounter: Secondary | ICD-10-CM | POA: Diagnosis not present

## 2013-05-17 DIAGNOSIS — T8132XA Disruption of internal operation (surgical) wound, not elsewhere classified, initial encounter: Secondary | ICD-10-CM | POA: Diagnosis not present

## 2013-05-17 DIAGNOSIS — M545 Low back pain: Secondary | ICD-10-CM | POA: Diagnosis not present

## 2013-05-17 DIAGNOSIS — J449 Chronic obstructive pulmonary disease, unspecified: Secondary | ICD-10-CM | POA: Diagnosis not present

## 2013-05-18 DIAGNOSIS — D649 Anemia, unspecified: Secondary | ICD-10-CM | POA: Diagnosis not present

## 2013-05-18 DIAGNOSIS — J449 Chronic obstructive pulmonary disease, unspecified: Secondary | ICD-10-CM | POA: Diagnosis not present

## 2013-05-18 DIAGNOSIS — T8132XA Disruption of internal operation (surgical) wound, not elsewhere classified, initial encounter: Secondary | ICD-10-CM | POA: Diagnosis not present

## 2013-05-18 DIAGNOSIS — T8140XA Infection following a procedure, unspecified, initial encounter: Secondary | ICD-10-CM | POA: Diagnosis not present

## 2013-05-18 DIAGNOSIS — E119 Type 2 diabetes mellitus without complications: Secondary | ICD-10-CM | POA: Diagnosis not present

## 2013-05-19 DIAGNOSIS — E119 Type 2 diabetes mellitus without complications: Secondary | ICD-10-CM | POA: Diagnosis not present

## 2013-05-19 DIAGNOSIS — D649 Anemia, unspecified: Secondary | ICD-10-CM | POA: Diagnosis not present

## 2013-05-19 DIAGNOSIS — T8132XA Disruption of internal operation (surgical) wound, not elsewhere classified, initial encounter: Secondary | ICD-10-CM | POA: Diagnosis not present

## 2013-05-19 DIAGNOSIS — T8140XA Infection following a procedure, unspecified, initial encounter: Secondary | ICD-10-CM | POA: Diagnosis not present

## 2013-05-19 DIAGNOSIS — J449 Chronic obstructive pulmonary disease, unspecified: Secondary | ICD-10-CM | POA: Diagnosis not present

## 2013-05-20 DIAGNOSIS — D649 Anemia, unspecified: Secondary | ICD-10-CM | POA: Diagnosis not present

## 2013-05-20 DIAGNOSIS — T8132XA Disruption of internal operation (surgical) wound, not elsewhere classified, initial encounter: Secondary | ICD-10-CM | POA: Diagnosis not present

## 2013-05-20 DIAGNOSIS — T8140XA Infection following a procedure, unspecified, initial encounter: Secondary | ICD-10-CM | POA: Diagnosis not present

## 2013-05-20 DIAGNOSIS — J449 Chronic obstructive pulmonary disease, unspecified: Secondary | ICD-10-CM | POA: Diagnosis not present

## 2013-05-20 DIAGNOSIS — E119 Type 2 diabetes mellitus without complications: Secondary | ICD-10-CM | POA: Diagnosis not present

## 2013-05-21 DIAGNOSIS — D649 Anemia, unspecified: Secondary | ICD-10-CM | POA: Diagnosis not present

## 2013-05-21 DIAGNOSIS — T8132XA Disruption of internal operation (surgical) wound, not elsewhere classified, initial encounter: Secondary | ICD-10-CM | POA: Diagnosis not present

## 2013-05-21 DIAGNOSIS — E119 Type 2 diabetes mellitus without complications: Secondary | ICD-10-CM | POA: Diagnosis not present

## 2013-05-21 DIAGNOSIS — J449 Chronic obstructive pulmonary disease, unspecified: Secondary | ICD-10-CM | POA: Diagnosis not present

## 2013-05-21 DIAGNOSIS — T8140XA Infection following a procedure, unspecified, initial encounter: Secondary | ICD-10-CM | POA: Diagnosis not present

## 2013-05-22 DIAGNOSIS — J449 Chronic obstructive pulmonary disease, unspecified: Secondary | ICD-10-CM | POA: Diagnosis not present

## 2013-05-22 DIAGNOSIS — T8132XA Disruption of internal operation (surgical) wound, not elsewhere classified, initial encounter: Secondary | ICD-10-CM | POA: Diagnosis not present

## 2013-05-22 DIAGNOSIS — E119 Type 2 diabetes mellitus without complications: Secondary | ICD-10-CM | POA: Diagnosis not present

## 2013-05-22 DIAGNOSIS — T8140XA Infection following a procedure, unspecified, initial encounter: Secondary | ICD-10-CM | POA: Diagnosis not present

## 2013-05-22 DIAGNOSIS — D649 Anemia, unspecified: Secondary | ICD-10-CM | POA: Diagnosis not present

## 2013-05-23 DIAGNOSIS — J449 Chronic obstructive pulmonary disease, unspecified: Secondary | ICD-10-CM | POA: Diagnosis not present

## 2013-05-23 DIAGNOSIS — T8140XA Infection following a procedure, unspecified, initial encounter: Secondary | ICD-10-CM | POA: Diagnosis not present

## 2013-05-23 DIAGNOSIS — T8132XA Disruption of internal operation (surgical) wound, not elsewhere classified, initial encounter: Secondary | ICD-10-CM | POA: Diagnosis not present

## 2013-05-23 DIAGNOSIS — D649 Anemia, unspecified: Secondary | ICD-10-CM | POA: Diagnosis not present

## 2013-05-23 DIAGNOSIS — E119 Type 2 diabetes mellitus without complications: Secondary | ICD-10-CM | POA: Diagnosis not present

## 2013-05-24 DIAGNOSIS — T8140XA Infection following a procedure, unspecified, initial encounter: Secondary | ICD-10-CM | POA: Diagnosis not present

## 2013-05-24 DIAGNOSIS — E119 Type 2 diabetes mellitus without complications: Secondary | ICD-10-CM | POA: Diagnosis not present

## 2013-05-24 DIAGNOSIS — D649 Anemia, unspecified: Secondary | ICD-10-CM | POA: Diagnosis not present

## 2013-05-24 DIAGNOSIS — T8132XA Disruption of internal operation (surgical) wound, not elsewhere classified, initial encounter: Secondary | ICD-10-CM | POA: Diagnosis not present

## 2013-05-24 DIAGNOSIS — J449 Chronic obstructive pulmonary disease, unspecified: Secondary | ICD-10-CM | POA: Diagnosis not present

## 2013-05-25 DIAGNOSIS — J449 Chronic obstructive pulmonary disease, unspecified: Secondary | ICD-10-CM | POA: Diagnosis not present

## 2013-05-25 DIAGNOSIS — D649 Anemia, unspecified: Secondary | ICD-10-CM | POA: Diagnosis not present

## 2013-05-25 DIAGNOSIS — T8132XA Disruption of internal operation (surgical) wound, not elsewhere classified, initial encounter: Secondary | ICD-10-CM | POA: Diagnosis not present

## 2013-05-25 DIAGNOSIS — E119 Type 2 diabetes mellitus without complications: Secondary | ICD-10-CM | POA: Diagnosis not present

## 2013-05-25 DIAGNOSIS — T8140XA Infection following a procedure, unspecified, initial encounter: Secondary | ICD-10-CM | POA: Diagnosis not present

## 2013-05-27 DIAGNOSIS — T8140XA Infection following a procedure, unspecified, initial encounter: Secondary | ICD-10-CM | POA: Diagnosis not present

## 2013-05-27 DIAGNOSIS — J449 Chronic obstructive pulmonary disease, unspecified: Secondary | ICD-10-CM | POA: Diagnosis not present

## 2013-05-27 DIAGNOSIS — D649 Anemia, unspecified: Secondary | ICD-10-CM | POA: Diagnosis not present

## 2013-05-27 DIAGNOSIS — E119 Type 2 diabetes mellitus without complications: Secondary | ICD-10-CM | POA: Diagnosis not present

## 2013-05-27 DIAGNOSIS — T8132XA Disruption of internal operation (surgical) wound, not elsewhere classified, initial encounter: Secondary | ICD-10-CM | POA: Diagnosis not present

## 2013-05-28 DIAGNOSIS — T8140XA Infection following a procedure, unspecified, initial encounter: Secondary | ICD-10-CM | POA: Diagnosis not present

## 2013-05-28 DIAGNOSIS — D649 Anemia, unspecified: Secondary | ICD-10-CM | POA: Diagnosis not present

## 2013-05-28 DIAGNOSIS — E119 Type 2 diabetes mellitus without complications: Secondary | ICD-10-CM | POA: Diagnosis not present

## 2013-05-28 DIAGNOSIS — J449 Chronic obstructive pulmonary disease, unspecified: Secondary | ICD-10-CM | POA: Diagnosis not present

## 2013-05-28 DIAGNOSIS — T8132XA Disruption of internal operation (surgical) wound, not elsewhere classified, initial encounter: Secondary | ICD-10-CM | POA: Diagnosis not present

## 2013-05-29 DIAGNOSIS — T8132XA Disruption of internal operation (surgical) wound, not elsewhere classified, initial encounter: Secondary | ICD-10-CM | POA: Diagnosis not present

## 2013-05-29 DIAGNOSIS — J449 Chronic obstructive pulmonary disease, unspecified: Secondary | ICD-10-CM | POA: Diagnosis not present

## 2013-05-29 DIAGNOSIS — D649 Anemia, unspecified: Secondary | ICD-10-CM | POA: Diagnosis not present

## 2013-05-29 DIAGNOSIS — T8140XA Infection following a procedure, unspecified, initial encounter: Secondary | ICD-10-CM | POA: Diagnosis not present

## 2013-05-29 DIAGNOSIS — E119 Type 2 diabetes mellitus without complications: Secondary | ICD-10-CM | POA: Diagnosis not present

## 2013-05-30 DIAGNOSIS — D649 Anemia, unspecified: Secondary | ICD-10-CM | POA: Diagnosis not present

## 2013-05-30 DIAGNOSIS — J449 Chronic obstructive pulmonary disease, unspecified: Secondary | ICD-10-CM | POA: Diagnosis not present

## 2013-05-30 DIAGNOSIS — E119 Type 2 diabetes mellitus without complications: Secondary | ICD-10-CM | POA: Diagnosis not present

## 2013-05-30 DIAGNOSIS — T8132XA Disruption of internal operation (surgical) wound, not elsewhere classified, initial encounter: Secondary | ICD-10-CM | POA: Diagnosis not present

## 2013-05-30 DIAGNOSIS — T8140XA Infection following a procedure, unspecified, initial encounter: Secondary | ICD-10-CM | POA: Diagnosis not present

## 2013-05-31 DIAGNOSIS — T8132XA Disruption of internal operation (surgical) wound, not elsewhere classified, initial encounter: Secondary | ICD-10-CM | POA: Diagnosis not present

## 2013-05-31 DIAGNOSIS — T8140XA Infection following a procedure, unspecified, initial encounter: Secondary | ICD-10-CM | POA: Diagnosis not present

## 2013-05-31 DIAGNOSIS — E119 Type 2 diabetes mellitus without complications: Secondary | ICD-10-CM | POA: Diagnosis not present

## 2013-05-31 DIAGNOSIS — D649 Anemia, unspecified: Secondary | ICD-10-CM | POA: Diagnosis not present

## 2013-05-31 DIAGNOSIS — J449 Chronic obstructive pulmonary disease, unspecified: Secondary | ICD-10-CM | POA: Diagnosis not present

## 2013-06-01 DIAGNOSIS — D649 Anemia, unspecified: Secondary | ICD-10-CM | POA: Diagnosis not present

## 2013-06-01 DIAGNOSIS — T8132XA Disruption of internal operation (surgical) wound, not elsewhere classified, initial encounter: Secondary | ICD-10-CM | POA: Diagnosis not present

## 2013-06-01 DIAGNOSIS — E119 Type 2 diabetes mellitus without complications: Secondary | ICD-10-CM | POA: Diagnosis not present

## 2013-06-01 DIAGNOSIS — T8140XA Infection following a procedure, unspecified, initial encounter: Secondary | ICD-10-CM | POA: Diagnosis not present

## 2013-06-01 DIAGNOSIS — J449 Chronic obstructive pulmonary disease, unspecified: Secondary | ICD-10-CM | POA: Diagnosis not present

## 2013-06-02 DIAGNOSIS — D649 Anemia, unspecified: Secondary | ICD-10-CM | POA: Diagnosis not present

## 2013-06-02 DIAGNOSIS — E119 Type 2 diabetes mellitus without complications: Secondary | ICD-10-CM | POA: Diagnosis not present

## 2013-06-02 DIAGNOSIS — J449 Chronic obstructive pulmonary disease, unspecified: Secondary | ICD-10-CM | POA: Diagnosis not present

## 2013-06-02 DIAGNOSIS — T8140XA Infection following a procedure, unspecified, initial encounter: Secondary | ICD-10-CM | POA: Diagnosis not present

## 2013-06-02 DIAGNOSIS — T8132XA Disruption of internal operation (surgical) wound, not elsewhere classified, initial encounter: Secondary | ICD-10-CM | POA: Diagnosis not present

## 2013-06-03 DIAGNOSIS — E119 Type 2 diabetes mellitus without complications: Secondary | ICD-10-CM | POA: Diagnosis not present

## 2013-06-03 DIAGNOSIS — D649 Anemia, unspecified: Secondary | ICD-10-CM | POA: Diagnosis not present

## 2013-06-03 DIAGNOSIS — J449 Chronic obstructive pulmonary disease, unspecified: Secondary | ICD-10-CM | POA: Diagnosis not present

## 2013-06-03 DIAGNOSIS — T8140XA Infection following a procedure, unspecified, initial encounter: Secondary | ICD-10-CM | POA: Diagnosis not present

## 2013-06-03 DIAGNOSIS — T8132XA Disruption of internal operation (surgical) wound, not elsewhere classified, initial encounter: Secondary | ICD-10-CM | POA: Diagnosis not present

## 2013-06-04 DIAGNOSIS — D649 Anemia, unspecified: Secondary | ICD-10-CM | POA: Diagnosis not present

## 2013-06-04 DIAGNOSIS — T8140XA Infection following a procedure, unspecified, initial encounter: Secondary | ICD-10-CM | POA: Diagnosis not present

## 2013-06-04 DIAGNOSIS — T8132XA Disruption of internal operation (surgical) wound, not elsewhere classified, initial encounter: Secondary | ICD-10-CM | POA: Diagnosis not present

## 2013-06-04 DIAGNOSIS — J449 Chronic obstructive pulmonary disease, unspecified: Secondary | ICD-10-CM | POA: Diagnosis not present

## 2013-06-04 DIAGNOSIS — E119 Type 2 diabetes mellitus without complications: Secondary | ICD-10-CM | POA: Diagnosis not present

## 2013-06-05 DIAGNOSIS — D649 Anemia, unspecified: Secondary | ICD-10-CM | POA: Diagnosis not present

## 2013-06-05 DIAGNOSIS — T8140XA Infection following a procedure, unspecified, initial encounter: Secondary | ICD-10-CM | POA: Diagnosis not present

## 2013-06-05 DIAGNOSIS — J449 Chronic obstructive pulmonary disease, unspecified: Secondary | ICD-10-CM | POA: Diagnosis not present

## 2013-06-05 DIAGNOSIS — T8132XA Disruption of internal operation (surgical) wound, not elsewhere classified, initial encounter: Secondary | ICD-10-CM | POA: Diagnosis not present

## 2013-06-05 DIAGNOSIS — E119 Type 2 diabetes mellitus without complications: Secondary | ICD-10-CM | POA: Diagnosis not present

## 2013-06-06 DIAGNOSIS — E119 Type 2 diabetes mellitus without complications: Secondary | ICD-10-CM | POA: Diagnosis not present

## 2013-06-06 DIAGNOSIS — T8140XA Infection following a procedure, unspecified, initial encounter: Secondary | ICD-10-CM | POA: Diagnosis not present

## 2013-06-06 DIAGNOSIS — D649 Anemia, unspecified: Secondary | ICD-10-CM | POA: Diagnosis not present

## 2013-06-06 DIAGNOSIS — J449 Chronic obstructive pulmonary disease, unspecified: Secondary | ICD-10-CM | POA: Diagnosis not present

## 2013-06-06 DIAGNOSIS — T8132XA Disruption of internal operation (surgical) wound, not elsewhere classified, initial encounter: Secondary | ICD-10-CM | POA: Diagnosis not present

## 2013-06-07 DIAGNOSIS — J449 Chronic obstructive pulmonary disease, unspecified: Secondary | ICD-10-CM | POA: Diagnosis not present

## 2013-06-07 DIAGNOSIS — D649 Anemia, unspecified: Secondary | ICD-10-CM | POA: Diagnosis not present

## 2013-06-07 DIAGNOSIS — T8132XA Disruption of internal operation (surgical) wound, not elsewhere classified, initial encounter: Secondary | ICD-10-CM | POA: Diagnosis not present

## 2013-06-07 DIAGNOSIS — E119 Type 2 diabetes mellitus without complications: Secondary | ICD-10-CM | POA: Diagnosis not present

## 2013-06-07 DIAGNOSIS — T8140XA Infection following a procedure, unspecified, initial encounter: Secondary | ICD-10-CM | POA: Diagnosis not present

## 2013-06-08 DIAGNOSIS — D649 Anemia, unspecified: Secondary | ICD-10-CM | POA: Diagnosis not present

## 2013-06-08 DIAGNOSIS — T8132XA Disruption of internal operation (surgical) wound, not elsewhere classified, initial encounter: Secondary | ICD-10-CM | POA: Diagnosis not present

## 2013-06-08 DIAGNOSIS — T8140XA Infection following a procedure, unspecified, initial encounter: Secondary | ICD-10-CM | POA: Diagnosis not present

## 2013-06-08 DIAGNOSIS — J449 Chronic obstructive pulmonary disease, unspecified: Secondary | ICD-10-CM | POA: Diagnosis not present

## 2013-06-08 DIAGNOSIS — E119 Type 2 diabetes mellitus without complications: Secondary | ICD-10-CM | POA: Diagnosis not present

## 2013-06-09 DIAGNOSIS — T8132XA Disruption of internal operation (surgical) wound, not elsewhere classified, initial encounter: Secondary | ICD-10-CM | POA: Diagnosis not present

## 2013-06-09 DIAGNOSIS — D649 Anemia, unspecified: Secondary | ICD-10-CM | POA: Diagnosis not present

## 2013-06-09 DIAGNOSIS — T8140XA Infection following a procedure, unspecified, initial encounter: Secondary | ICD-10-CM | POA: Diagnosis not present

## 2013-06-09 DIAGNOSIS — E119 Type 2 diabetes mellitus without complications: Secondary | ICD-10-CM | POA: Diagnosis not present

## 2013-06-09 DIAGNOSIS — J449 Chronic obstructive pulmonary disease, unspecified: Secondary | ICD-10-CM | POA: Diagnosis not present

## 2013-06-10 DIAGNOSIS — J449 Chronic obstructive pulmonary disease, unspecified: Secondary | ICD-10-CM | POA: Diagnosis not present

## 2013-06-10 DIAGNOSIS — T8140XA Infection following a procedure, unspecified, initial encounter: Secondary | ICD-10-CM | POA: Diagnosis not present

## 2013-06-10 DIAGNOSIS — T8132XA Disruption of internal operation (surgical) wound, not elsewhere classified, initial encounter: Secondary | ICD-10-CM | POA: Diagnosis not present

## 2013-06-10 DIAGNOSIS — D649 Anemia, unspecified: Secondary | ICD-10-CM | POA: Diagnosis not present

## 2013-06-10 DIAGNOSIS — E119 Type 2 diabetes mellitus without complications: Secondary | ICD-10-CM | POA: Diagnosis not present

## 2013-06-11 DIAGNOSIS — T8140XA Infection following a procedure, unspecified, initial encounter: Secondary | ICD-10-CM | POA: Diagnosis not present

## 2013-06-11 DIAGNOSIS — E119 Type 2 diabetes mellitus without complications: Secondary | ICD-10-CM | POA: Diagnosis not present

## 2013-06-11 DIAGNOSIS — D649 Anemia, unspecified: Secondary | ICD-10-CM | POA: Diagnosis not present

## 2013-06-11 DIAGNOSIS — T8132XA Disruption of internal operation (surgical) wound, not elsewhere classified, initial encounter: Secondary | ICD-10-CM | POA: Diagnosis not present

## 2013-06-11 DIAGNOSIS — J449 Chronic obstructive pulmonary disease, unspecified: Secondary | ICD-10-CM | POA: Diagnosis not present

## 2013-06-12 DIAGNOSIS — E119 Type 2 diabetes mellitus without complications: Secondary | ICD-10-CM | POA: Diagnosis not present

## 2013-06-12 DIAGNOSIS — T8140XA Infection following a procedure, unspecified, initial encounter: Secondary | ICD-10-CM | POA: Diagnosis not present

## 2013-06-12 DIAGNOSIS — D649 Anemia, unspecified: Secondary | ICD-10-CM | POA: Diagnosis not present

## 2013-06-12 DIAGNOSIS — J449 Chronic obstructive pulmonary disease, unspecified: Secondary | ICD-10-CM | POA: Diagnosis not present

## 2013-06-12 DIAGNOSIS — T8132XA Disruption of internal operation (surgical) wound, not elsewhere classified, initial encounter: Secondary | ICD-10-CM | POA: Diagnosis not present

## 2013-06-13 DIAGNOSIS — J449 Chronic obstructive pulmonary disease, unspecified: Secondary | ICD-10-CM | POA: Diagnosis not present

## 2013-06-13 DIAGNOSIS — D649 Anemia, unspecified: Secondary | ICD-10-CM | POA: Diagnosis not present

## 2013-06-13 DIAGNOSIS — T8140XA Infection following a procedure, unspecified, initial encounter: Secondary | ICD-10-CM | POA: Diagnosis not present

## 2013-06-13 DIAGNOSIS — E119 Type 2 diabetes mellitus without complications: Secondary | ICD-10-CM | POA: Diagnosis not present

## 2013-06-13 DIAGNOSIS — T8132XA Disruption of internal operation (surgical) wound, not elsewhere classified, initial encounter: Secondary | ICD-10-CM | POA: Diagnosis not present

## 2013-06-14 DIAGNOSIS — T8132XA Disruption of internal operation (surgical) wound, not elsewhere classified, initial encounter: Secondary | ICD-10-CM | POA: Diagnosis not present

## 2013-06-14 DIAGNOSIS — J449 Chronic obstructive pulmonary disease, unspecified: Secondary | ICD-10-CM | POA: Diagnosis not present

## 2013-06-14 DIAGNOSIS — E119 Type 2 diabetes mellitus without complications: Secondary | ICD-10-CM | POA: Diagnosis not present

## 2013-06-14 DIAGNOSIS — T8140XA Infection following a procedure, unspecified, initial encounter: Secondary | ICD-10-CM | POA: Diagnosis not present

## 2013-06-14 DIAGNOSIS — D649 Anemia, unspecified: Secondary | ICD-10-CM | POA: Diagnosis not present

## 2013-06-16 DIAGNOSIS — T8140XA Infection following a procedure, unspecified, initial encounter: Secondary | ICD-10-CM | POA: Diagnosis not present

## 2013-06-16 DIAGNOSIS — T8132XA Disruption of internal operation (surgical) wound, not elsewhere classified, initial encounter: Secondary | ICD-10-CM | POA: Diagnosis not present

## 2013-06-16 DIAGNOSIS — E119 Type 2 diabetes mellitus without complications: Secondary | ICD-10-CM | POA: Diagnosis not present

## 2013-06-16 DIAGNOSIS — J449 Chronic obstructive pulmonary disease, unspecified: Secondary | ICD-10-CM | POA: Diagnosis not present

## 2013-06-16 DIAGNOSIS — D649 Anemia, unspecified: Secondary | ICD-10-CM | POA: Diagnosis not present

## 2013-06-17 DIAGNOSIS — J449 Chronic obstructive pulmonary disease, unspecified: Secondary | ICD-10-CM | POA: Diagnosis not present

## 2013-06-17 DIAGNOSIS — D649 Anemia, unspecified: Secondary | ICD-10-CM | POA: Diagnosis not present

## 2013-06-17 DIAGNOSIS — T8132XA Disruption of internal operation (surgical) wound, not elsewhere classified, initial encounter: Secondary | ICD-10-CM | POA: Diagnosis not present

## 2013-06-17 DIAGNOSIS — E119 Type 2 diabetes mellitus without complications: Secondary | ICD-10-CM | POA: Diagnosis not present

## 2013-06-17 DIAGNOSIS — T8140XA Infection following a procedure, unspecified, initial encounter: Secondary | ICD-10-CM | POA: Diagnosis not present

## 2013-06-18 DIAGNOSIS — J449 Chronic obstructive pulmonary disease, unspecified: Secondary | ICD-10-CM | POA: Diagnosis not present

## 2013-06-18 DIAGNOSIS — T8140XA Infection following a procedure, unspecified, initial encounter: Secondary | ICD-10-CM | POA: Diagnosis not present

## 2013-06-18 DIAGNOSIS — E119 Type 2 diabetes mellitus without complications: Secondary | ICD-10-CM | POA: Diagnosis not present

## 2013-06-18 DIAGNOSIS — D649 Anemia, unspecified: Secondary | ICD-10-CM | POA: Diagnosis not present

## 2013-06-18 DIAGNOSIS — T8132XA Disruption of internal operation (surgical) wound, not elsewhere classified, initial encounter: Secondary | ICD-10-CM | POA: Diagnosis not present

## 2013-06-19 DIAGNOSIS — D649 Anemia, unspecified: Secondary | ICD-10-CM | POA: Diagnosis not present

## 2013-06-19 DIAGNOSIS — T8140XA Infection following a procedure, unspecified, initial encounter: Secondary | ICD-10-CM | POA: Diagnosis not present

## 2013-06-19 DIAGNOSIS — E119 Type 2 diabetes mellitus without complications: Secondary | ICD-10-CM | POA: Diagnosis not present

## 2013-06-19 DIAGNOSIS — T8132XA Disruption of internal operation (surgical) wound, not elsewhere classified, initial encounter: Secondary | ICD-10-CM | POA: Diagnosis not present

## 2013-06-19 DIAGNOSIS — J449 Chronic obstructive pulmonary disease, unspecified: Secondary | ICD-10-CM | POA: Diagnosis not present

## 2013-06-22 DIAGNOSIS — D649 Anemia, unspecified: Secondary | ICD-10-CM | POA: Diagnosis not present

## 2013-06-22 DIAGNOSIS — J449 Chronic obstructive pulmonary disease, unspecified: Secondary | ICD-10-CM | POA: Diagnosis not present

## 2013-06-22 DIAGNOSIS — E119 Type 2 diabetes mellitus without complications: Secondary | ICD-10-CM | POA: Diagnosis not present

## 2013-06-22 DIAGNOSIS — T8140XA Infection following a procedure, unspecified, initial encounter: Secondary | ICD-10-CM | POA: Diagnosis not present

## 2013-06-22 DIAGNOSIS — T8132XA Disruption of internal operation (surgical) wound, not elsewhere classified, initial encounter: Secondary | ICD-10-CM | POA: Diagnosis not present

## 2013-06-24 DIAGNOSIS — D649 Anemia, unspecified: Secondary | ICD-10-CM | POA: Diagnosis not present

## 2013-06-24 DIAGNOSIS — T8140XA Infection following a procedure, unspecified, initial encounter: Secondary | ICD-10-CM | POA: Diagnosis not present

## 2013-06-24 DIAGNOSIS — T8132XA Disruption of internal operation (surgical) wound, not elsewhere classified, initial encounter: Secondary | ICD-10-CM | POA: Diagnosis not present

## 2013-06-24 DIAGNOSIS — Z4789 Encounter for other orthopedic aftercare: Secondary | ICD-10-CM | POA: Diagnosis not present

## 2013-06-24 DIAGNOSIS — E119 Type 2 diabetes mellitus without complications: Secondary | ICD-10-CM | POA: Diagnosis not present

## 2013-06-24 DIAGNOSIS — J449 Chronic obstructive pulmonary disease, unspecified: Secondary | ICD-10-CM | POA: Diagnosis not present

## 2013-06-27 DIAGNOSIS — T8140XA Infection following a procedure, unspecified, initial encounter: Secondary | ICD-10-CM | POA: Diagnosis not present

## 2013-06-27 DIAGNOSIS — D649 Anemia, unspecified: Secondary | ICD-10-CM | POA: Diagnosis not present

## 2013-06-27 DIAGNOSIS — T8132XA Disruption of internal operation (surgical) wound, not elsewhere classified, initial encounter: Secondary | ICD-10-CM | POA: Diagnosis not present

## 2013-06-27 DIAGNOSIS — E119 Type 2 diabetes mellitus without complications: Secondary | ICD-10-CM | POA: Diagnosis not present

## 2013-06-27 DIAGNOSIS — J449 Chronic obstructive pulmonary disease, unspecified: Secondary | ICD-10-CM | POA: Diagnosis not present

## 2013-07-01 DIAGNOSIS — T8132XA Disruption of internal operation (surgical) wound, not elsewhere classified, initial encounter: Secondary | ICD-10-CM | POA: Diagnosis not present

## 2013-07-01 DIAGNOSIS — E119 Type 2 diabetes mellitus without complications: Secondary | ICD-10-CM | POA: Diagnosis not present

## 2013-07-01 DIAGNOSIS — D649 Anemia, unspecified: Secondary | ICD-10-CM | POA: Diagnosis not present

## 2013-07-01 DIAGNOSIS — T8140XA Infection following a procedure, unspecified, initial encounter: Secondary | ICD-10-CM | POA: Diagnosis not present

## 2013-07-01 DIAGNOSIS — J449 Chronic obstructive pulmonary disease, unspecified: Secondary | ICD-10-CM | POA: Diagnosis not present

## 2013-07-04 DIAGNOSIS — J449 Chronic obstructive pulmonary disease, unspecified: Secondary | ICD-10-CM | POA: Diagnosis not present

## 2013-07-04 DIAGNOSIS — E119 Type 2 diabetes mellitus without complications: Secondary | ICD-10-CM | POA: Diagnosis not present

## 2013-07-04 DIAGNOSIS — T8132XA Disruption of internal operation (surgical) wound, not elsewhere classified, initial encounter: Secondary | ICD-10-CM | POA: Diagnosis not present

## 2013-07-04 DIAGNOSIS — T8140XA Infection following a procedure, unspecified, initial encounter: Secondary | ICD-10-CM | POA: Diagnosis not present

## 2013-07-04 DIAGNOSIS — D649 Anemia, unspecified: Secondary | ICD-10-CM | POA: Diagnosis not present

## 2013-07-07 DIAGNOSIS — J449 Chronic obstructive pulmonary disease, unspecified: Secondary | ICD-10-CM | POA: Diagnosis not present

## 2013-07-07 DIAGNOSIS — D649 Anemia, unspecified: Secondary | ICD-10-CM | POA: Diagnosis not present

## 2013-07-07 DIAGNOSIS — E119 Type 2 diabetes mellitus without complications: Secondary | ICD-10-CM | POA: Diagnosis not present

## 2013-07-07 DIAGNOSIS — T8132XA Disruption of internal operation (surgical) wound, not elsewhere classified, initial encounter: Secondary | ICD-10-CM | POA: Diagnosis not present

## 2013-07-07 DIAGNOSIS — T8140XA Infection following a procedure, unspecified, initial encounter: Secondary | ICD-10-CM | POA: Diagnosis not present

## 2013-07-11 DIAGNOSIS — Z4789 Encounter for other orthopedic aftercare: Secondary | ICD-10-CM | POA: Diagnosis not present

## 2013-07-11 DIAGNOSIS — M5137 Other intervertebral disc degeneration, lumbosacral region: Secondary | ICD-10-CM | POA: Diagnosis not present

## 2013-07-11 DIAGNOSIS — G894 Chronic pain syndrome: Secondary | ICD-10-CM | POA: Diagnosis not present

## 2013-07-21 ENCOUNTER — Other Ambulatory Visit: Payer: Self-pay

## 2013-08-05 DIAGNOSIS — M5137 Other intervertebral disc degeneration, lumbosacral region: Secondary | ICD-10-CM | POA: Diagnosis not present

## 2013-08-08 ENCOUNTER — Other Ambulatory Visit: Payer: Self-pay | Admitting: *Deleted

## 2013-08-08 DIAGNOSIS — E7800 Pure hypercholesterolemia, unspecified: Secondary | ICD-10-CM

## 2013-08-08 DIAGNOSIS — Z79899 Other long term (current) drug therapy: Secondary | ICD-10-CM

## 2013-09-02 DIAGNOSIS — M5137 Other intervertebral disc degeneration, lumbosacral region: Secondary | ICD-10-CM | POA: Diagnosis not present

## 2013-09-22 ENCOUNTER — Other Ambulatory Visit: Payer: Medicare Other

## 2013-10-01 ENCOUNTER — Other Ambulatory Visit: Payer: Self-pay | Admitting: Cardiology

## 2013-11-02 DIAGNOSIS — M5137 Other intervertebral disc degeneration, lumbosacral region: Secondary | ICD-10-CM | POA: Diagnosis not present

## 2013-11-03 DIAGNOSIS — S6990XA Unspecified injury of unspecified wrist, hand and finger(s), initial encounter: Secondary | ICD-10-CM | POA: Diagnosis not present

## 2013-11-03 DIAGNOSIS — M5137 Other intervertebral disc degeneration, lumbosacral region: Secondary | ICD-10-CM | POA: Diagnosis not present

## 2013-11-03 DIAGNOSIS — S59909A Unspecified injury of unspecified elbow, initial encounter: Secondary | ICD-10-CM | POA: Diagnosis not present

## 2013-11-18 DIAGNOSIS — S59909A Unspecified injury of unspecified elbow, initial encounter: Secondary | ICD-10-CM | POA: Diagnosis not present

## 2013-11-18 DIAGNOSIS — S6990XA Unspecified injury of unspecified wrist, hand and finger(s), initial encounter: Secondary | ICD-10-CM | POA: Diagnosis not present

## 2014-01-11 ENCOUNTER — Other Ambulatory Visit: Payer: Self-pay

## 2014-01-11 DIAGNOSIS — Z1231 Encounter for screening mammogram for malignant neoplasm of breast: Secondary | ICD-10-CM

## 2014-01-11 DIAGNOSIS — Z803 Family history of malignant neoplasm of breast: Secondary | ICD-10-CM

## 2014-01-15 IMAGING — CR DG CHEST 2V
2 series · 2 of 2 positions shown · non-contrast
Comparison: 03/01/2013

CLINICAL DATA: Cough and shortness of breath.

CHEST - 2 VIEW

[w chest pa]
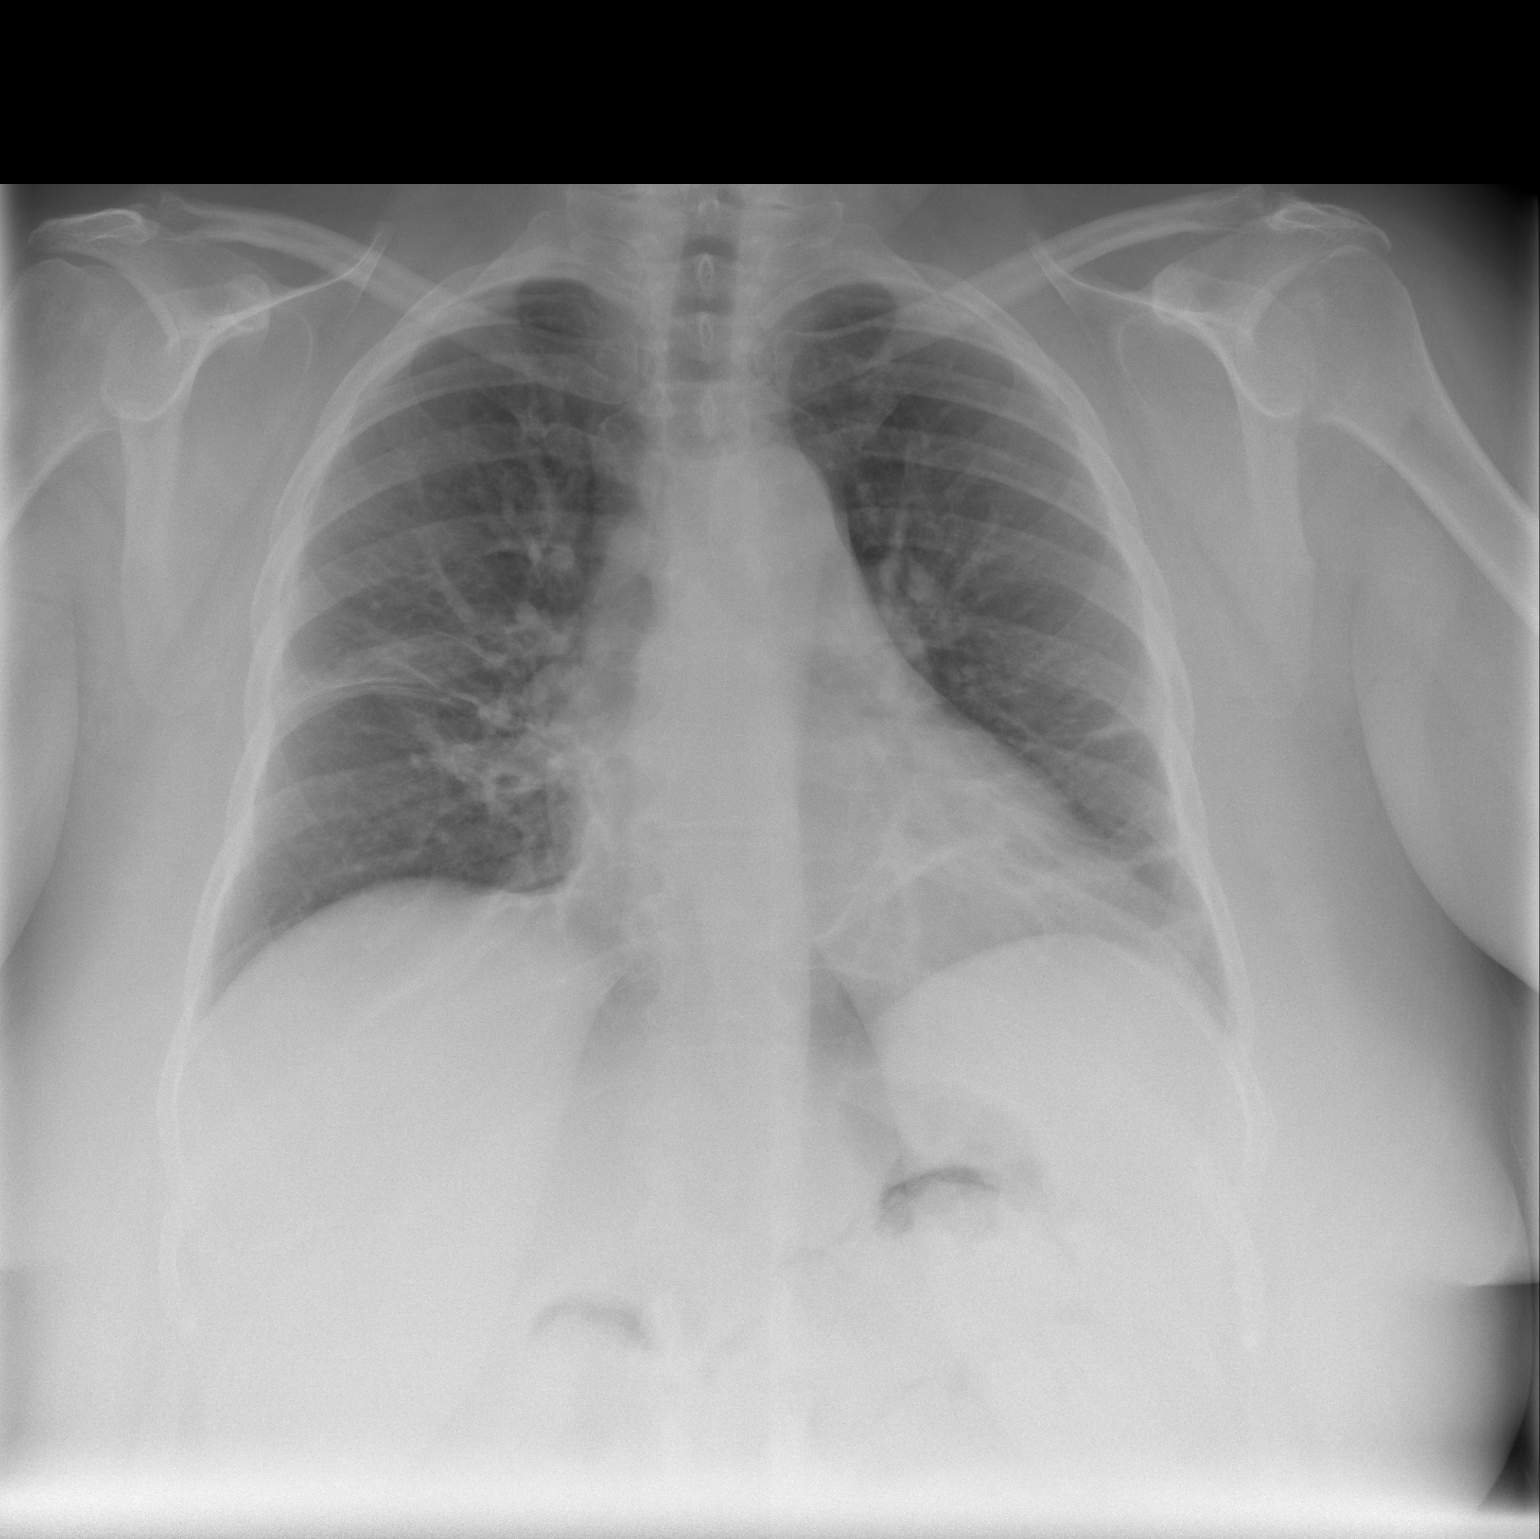

[w chest lat]
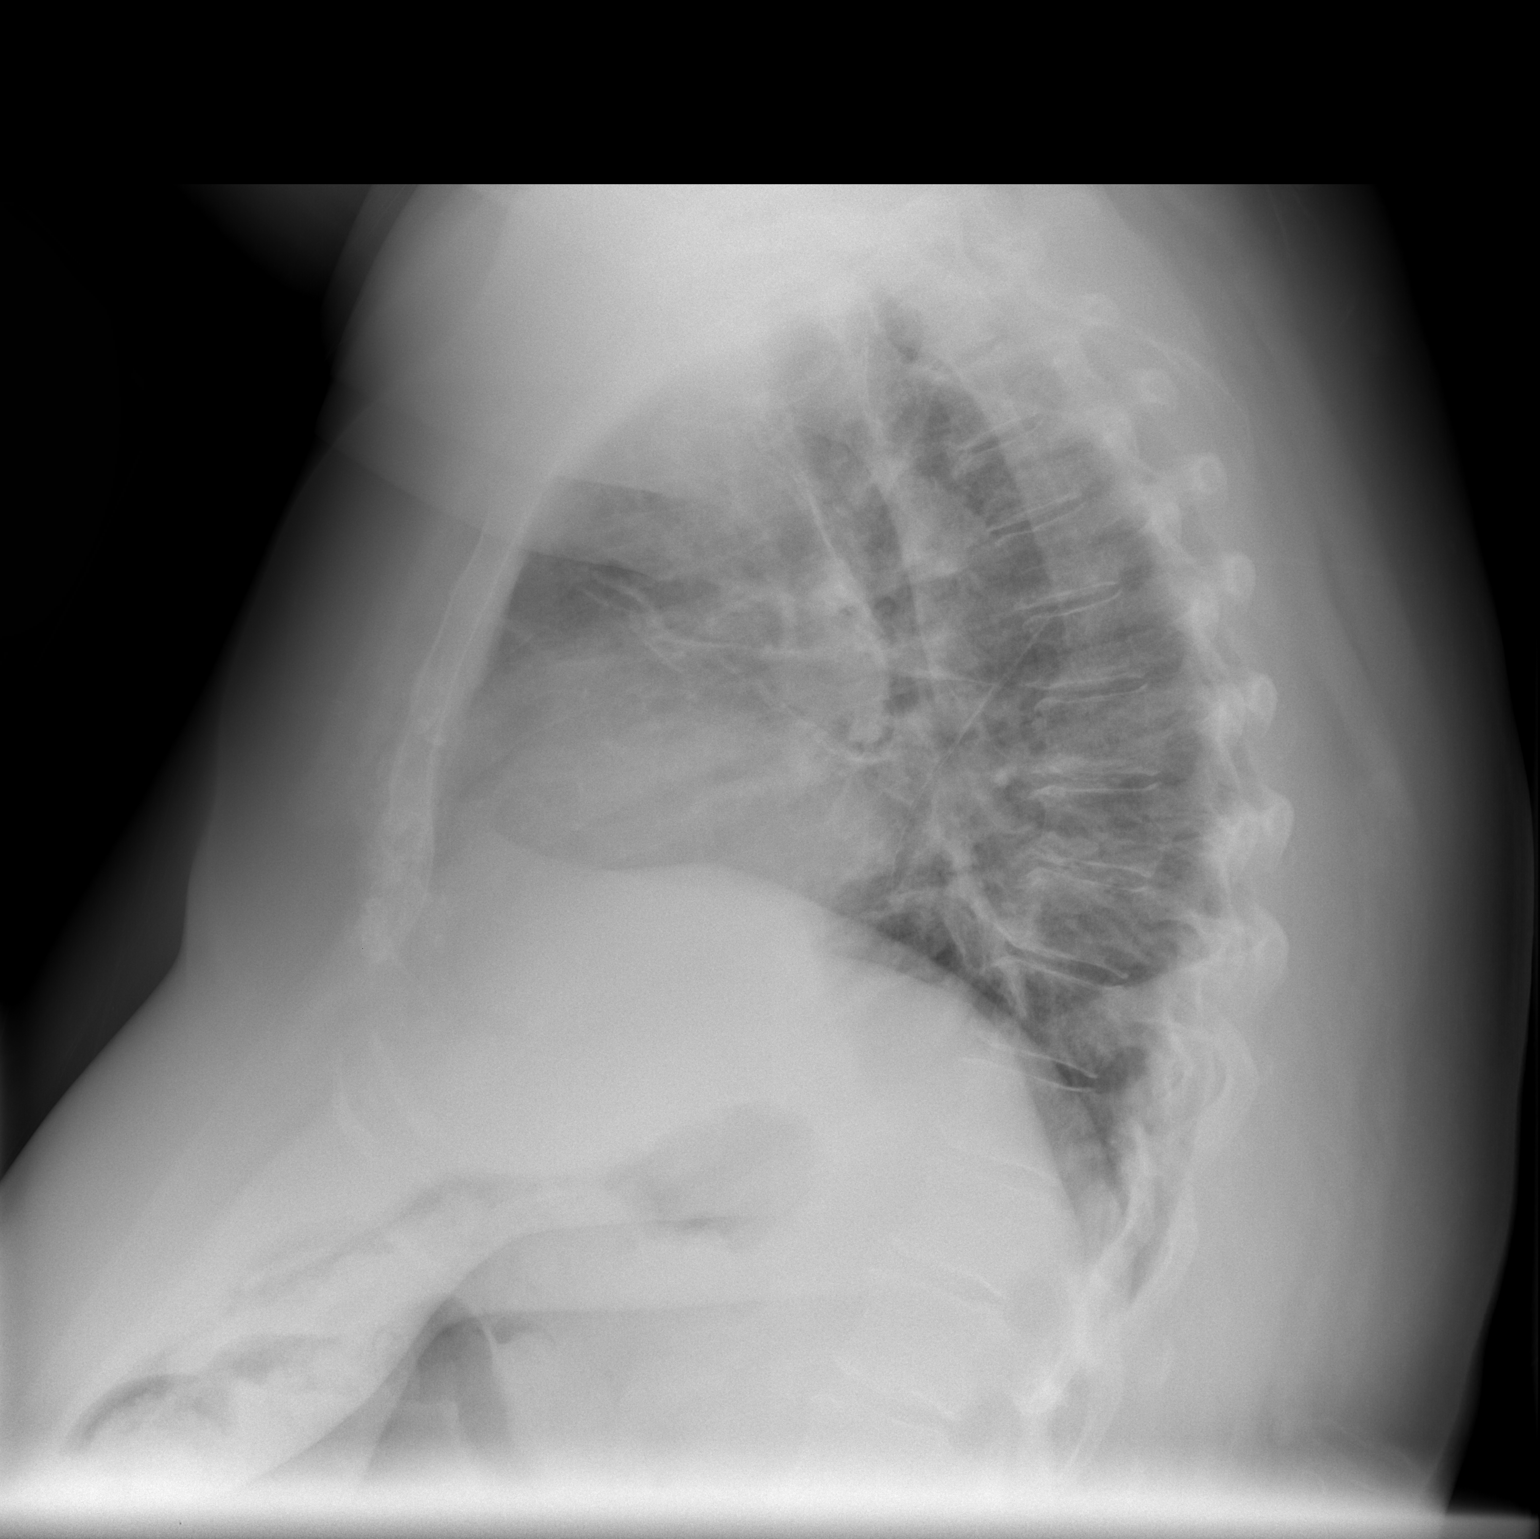

[2 of 2 positions shown; findings below may reference images not displayed]

FINDINGS: The heart size is normal.  Pulmonary vascularity is
slightly prominent.  There are linear areas of atelectasis in the
mid and lower lung zones bilaterally.  No effusions.  No
consolidative infiltrates.  No acute osseous abnormality.
IMPRESSION: Bilateral atelectasis, new.  Slight pulmonary vascular prominence.

## 2014-01-26 DIAGNOSIS — Z79899 Other long term (current) drug therapy: Secondary | ICD-10-CM | POA: Diagnosis not present

## 2014-01-26 DIAGNOSIS — G894 Chronic pain syndrome: Secondary | ICD-10-CM | POA: Diagnosis not present

## 2014-01-26 DIAGNOSIS — M5137 Other intervertebral disc degeneration, lumbosacral region: Secondary | ICD-10-CM | POA: Diagnosis not present

## 2014-01-26 DIAGNOSIS — Z9889 Other specified postprocedural states: Secondary | ICD-10-CM | POA: Diagnosis not present

## 2014-01-31 ENCOUNTER — Ambulatory Visit
Admission: RE | Admit: 2014-01-31 | Discharge: 2014-01-31 | Disposition: A | Discharge status: Home / Self Care | Payer: Medicare Other | Source: Ambulatory Visit

## 2014-01-31 DIAGNOSIS — Z1231 Encounter for screening mammogram for malignant neoplasm of breast: Secondary | ICD-10-CM | POA: Diagnosis not present

## 2014-01-31 DIAGNOSIS — Z803 Family history of malignant neoplasm of breast: Secondary | ICD-10-CM

## 2014-02-08 ENCOUNTER — Other Ambulatory Visit: Payer: Self-pay | Admitting: Gastroenterology

## 2014-02-08 DIAGNOSIS — K648 Other hemorrhoids: Secondary | ICD-10-CM | POA: Diagnosis not present

## 2014-02-08 DIAGNOSIS — Z86010 Personal history of colonic polyps: Secondary | ICD-10-CM | POA: Diagnosis not present

## 2014-02-08 DIAGNOSIS — D126 Benign neoplasm of colon, unspecified: Secondary | ICD-10-CM | POA: Diagnosis not present

## 2014-02-08 DIAGNOSIS — Z09 Encounter for follow-up examination after completed treatment for conditions other than malignant neoplasm: Secondary | ICD-10-CM | POA: Diagnosis not present

## 2014-04-12 DIAGNOSIS — E785 Hyperlipidemia, unspecified: Secondary | ICD-10-CM | POA: Diagnosis not present

## 2014-04-12 DIAGNOSIS — F329 Major depressive disorder, single episode, unspecified: Secondary | ICD-10-CM | POA: Diagnosis not present

## 2014-04-12 DIAGNOSIS — F17200 Nicotine dependence, unspecified, uncomplicated: Secondary | ICD-10-CM | POA: Diagnosis not present

## 2014-04-12 DIAGNOSIS — E119 Type 2 diabetes mellitus without complications: Secondary | ICD-10-CM | POA: Diagnosis not present

## 2014-04-12 DIAGNOSIS — R609 Edema, unspecified: Secondary | ICD-10-CM | POA: Diagnosis not present

## 2014-04-12 DIAGNOSIS — Z1159 Encounter for screening for other viral diseases: Secondary | ICD-10-CM | POA: Diagnosis not present

## 2014-04-12 DIAGNOSIS — F3289 Other specified depressive episodes: Secondary | ICD-10-CM | POA: Diagnosis not present

## 2014-04-12 DIAGNOSIS — Z0000 Encounter for general adult medical examination without abnormal findings: Secondary | ICD-10-CM | POA: Diagnosis not present

## 2014-04-12 DIAGNOSIS — J449 Chronic obstructive pulmonary disease, unspecified: Secondary | ICD-10-CM | POA: Diagnosis not present

## 2014-04-12 DIAGNOSIS — I10 Essential (primary) hypertension: Secondary | ICD-10-CM | POA: Diagnosis not present

## 2014-04-12 DIAGNOSIS — F411 Generalized anxiety disorder: Secondary | ICD-10-CM | POA: Diagnosis not present

## 2014-04-20 DIAGNOSIS — Z9889 Other specified postprocedural states: Secondary | ICD-10-CM | POA: Diagnosis not present

## 2014-04-20 DIAGNOSIS — Z79899 Other long term (current) drug therapy: Secondary | ICD-10-CM | POA: Diagnosis not present

## 2014-04-20 DIAGNOSIS — G894 Chronic pain syndrome: Secondary | ICD-10-CM | POA: Diagnosis not present

## 2014-04-20 DIAGNOSIS — M5137 Other intervertebral disc degeneration, lumbosacral region: Secondary | ICD-10-CM | POA: Diagnosis not present

## 2014-06-15 DIAGNOSIS — K7460 Unspecified cirrhosis of liver: Secondary | ICD-10-CM

## 2014-06-15 HISTORY — DX: Unspecified cirrhosis of liver: K74.60

## 2014-06-22 DIAGNOSIS — Z79891 Long term (current) use of opiate analgesic: Secondary | ICD-10-CM | POA: Diagnosis not present

## 2014-06-22 DIAGNOSIS — M5136 Other intervertebral disc degeneration, lumbar region: Secondary | ICD-10-CM | POA: Diagnosis not present

## 2014-06-22 DIAGNOSIS — S6991XA Unspecified injury of right wrist, hand and finger(s), initial encounter: Secondary | ICD-10-CM | POA: Diagnosis not present

## 2014-06-22 DIAGNOSIS — G894 Chronic pain syndrome: Secondary | ICD-10-CM | POA: Diagnosis not present

## 2014-07-10 ENCOUNTER — Encounter (HOSPITAL_COMMUNITY): Payer: Self-pay | Admitting: Emergency Medicine

## 2014-07-10 ENCOUNTER — Inpatient Hospital Stay (HOSPITAL_COMMUNITY)
Admission: EM | Admit: 2014-07-10 | Discharge: 2014-07-12 | DRG: 389 | Disposition: A | Discharge status: Home / Self Care | Payer: Medicare Other | Attending: Internal Medicine | Admitting: Internal Medicine

## 2014-07-10 ENCOUNTER — Emergency Department (HOSPITAL_COMMUNITY): Payer: Medicare Other

## 2014-07-10 DIAGNOSIS — Z881 Allergy status to other antibiotic agents status: Secondary | ICD-10-CM | POA: Diagnosis not present

## 2014-07-10 DIAGNOSIS — K660 Peritoneal adhesions (postprocedural) (postinfection): Secondary | ICD-10-CM | POA: Diagnosis present

## 2014-07-10 DIAGNOSIS — R109 Unspecified abdominal pain: Secondary | ICD-10-CM | POA: Diagnosis not present

## 2014-07-10 DIAGNOSIS — K7581 Nonalcoholic steatohepatitis (NASH): Secondary | ICD-10-CM | POA: Diagnosis present

## 2014-07-10 DIAGNOSIS — I10 Essential (primary) hypertension: Secondary | ICD-10-CM | POA: Diagnosis present

## 2014-07-10 DIAGNOSIS — Z86010 Personal history of colonic polyps: Secondary | ICD-10-CM | POA: Diagnosis not present

## 2014-07-10 DIAGNOSIS — Z6841 Body Mass Index (BMI) 40.0 and over, adult: Secondary | ICD-10-CM

## 2014-07-10 DIAGNOSIS — M4310 Spondylolisthesis, site unspecified: Secondary | ICD-10-CM | POA: Diagnosis present

## 2014-07-10 DIAGNOSIS — M549 Dorsalgia, unspecified: Secondary | ICD-10-CM | POA: Diagnosis present

## 2014-07-10 DIAGNOSIS — I2510 Atherosclerotic heart disease of native coronary artery without angina pectoris: Secondary | ICD-10-CM | POA: Diagnosis present

## 2014-07-10 DIAGNOSIS — E119 Type 2 diabetes mellitus without complications: Secondary | ICD-10-CM | POA: Diagnosis present

## 2014-07-10 DIAGNOSIS — Z880 Allergy status to penicillin: Secondary | ICD-10-CM

## 2014-07-10 DIAGNOSIS — G8929 Other chronic pain: Secondary | ICD-10-CM | POA: Diagnosis present

## 2014-07-10 DIAGNOSIS — R079 Chest pain, unspecified: Secondary | ICD-10-CM | POA: Diagnosis not present

## 2014-07-10 DIAGNOSIS — F17210 Nicotine dependence, cigarettes, uncomplicated: Secondary | ICD-10-CM | POA: Diagnosis present

## 2014-07-10 DIAGNOSIS — R110 Nausea: Secondary | ICD-10-CM | POA: Diagnosis not present

## 2014-07-10 DIAGNOSIS — K740 Hepatic fibrosis: Secondary | ICD-10-CM | POA: Diagnosis not present

## 2014-07-10 DIAGNOSIS — R1084 Generalized abdominal pain: Secondary | ICD-10-CM

## 2014-07-10 DIAGNOSIS — Z86711 Personal history of pulmonary embolism: Secondary | ICD-10-CM | POA: Diagnosis not present

## 2014-07-10 DIAGNOSIS — R188 Other ascites: Secondary | ICD-10-CM | POA: Diagnosis not present

## 2014-07-10 DIAGNOSIS — Z888 Allergy status to other drugs, medicaments and biological substances status: Secondary | ICD-10-CM | POA: Diagnosis not present

## 2014-07-10 DIAGNOSIS — Z9889 Other specified postprocedural states: Secondary | ICD-10-CM | POA: Diagnosis not present

## 2014-07-10 DIAGNOSIS — R0602 Shortness of breath: Secondary | ICD-10-CM | POA: Diagnosis not present

## 2014-07-10 DIAGNOSIS — R0789 Other chest pain: Secondary | ICD-10-CM | POA: Diagnosis not present

## 2014-07-10 DIAGNOSIS — K567 Ileus, unspecified: Principal | ICD-10-CM | POA: Diagnosis present

## 2014-07-10 DIAGNOSIS — J449 Chronic obstructive pulmonary disease, unspecified: Secondary | ICD-10-CM | POA: Diagnosis present

## 2014-07-10 DIAGNOSIS — G43909 Migraine, unspecified, not intractable, without status migrainosus: Secondary | ICD-10-CM | POA: Diagnosis present

## 2014-07-10 DIAGNOSIS — Z79899 Other long term (current) drug therapy: Secondary | ICD-10-CM | POA: Diagnosis not present

## 2014-07-10 DIAGNOSIS — J45909 Unspecified asthma, uncomplicated: Secondary | ICD-10-CM | POA: Diagnosis present

## 2014-07-10 DIAGNOSIS — K219 Gastro-esophageal reflux disease without esophagitis: Secondary | ICD-10-CM | POA: Diagnosis present

## 2014-07-10 DIAGNOSIS — F329 Major depressive disorder, single episode, unspecified: Secondary | ICD-10-CM | POA: Diagnosis present

## 2014-07-10 DIAGNOSIS — G4730 Sleep apnea, unspecified: Secondary | ICD-10-CM | POA: Diagnosis present

## 2014-07-10 DIAGNOSIS — K56609 Unspecified intestinal obstruction, unspecified as to partial versus complete obstruction: Secondary | ICD-10-CM

## 2014-07-10 DIAGNOSIS — Z90710 Acquired absence of both cervix and uterus: Secondary | ICD-10-CM | POA: Diagnosis not present

## 2014-07-10 DIAGNOSIS — M1990 Unspecified osteoarthritis, unspecified site: Secondary | ICD-10-CM | POA: Diagnosis present

## 2014-07-10 DIAGNOSIS — Z720 Tobacco use: Secondary | ICD-10-CM | POA: Diagnosis present

## 2014-07-10 DIAGNOSIS — D72829 Elevated white blood cell count, unspecified: Secondary | ICD-10-CM | POA: Diagnosis present

## 2014-07-10 DIAGNOSIS — K7460 Unspecified cirrhosis of liver: Secondary | ICD-10-CM | POA: Diagnosis not present

## 2014-07-10 DIAGNOSIS — R1110 Vomiting, unspecified: Secondary | ICD-10-CM | POA: Diagnosis not present

## 2014-07-10 DIAGNOSIS — T40605A Adverse effect of unspecified narcotics, initial encounter: Secondary | ICD-10-CM | POA: Diagnosis present

## 2014-07-10 DIAGNOSIS — K819 Cholecystitis, unspecified: Secondary | ICD-10-CM | POA: Diagnosis present

## 2014-07-10 DIAGNOSIS — R1013 Epigastric pain: Secondary | ICD-10-CM | POA: Diagnosis not present

## 2014-07-10 DIAGNOSIS — E881 Lipodystrophy, not elsewhere classified: Secondary | ICD-10-CM | POA: Diagnosis present

## 2014-07-10 DIAGNOSIS — K5900 Constipation, unspecified: Secondary | ICD-10-CM | POA: Diagnosis present

## 2014-07-10 DIAGNOSIS — E1159 Type 2 diabetes mellitus with other circulatory complications: Secondary | ICD-10-CM | POA: Diagnosis present

## 2014-07-10 DIAGNOSIS — I152 Hypertension secondary to endocrine disorders: Secondary | ICD-10-CM | POA: Diagnosis present

## 2014-07-10 HISTORY — DX: Ileus, unspecified: K56.7

## 2014-07-10 HISTORY — DX: Generalized abdominal pain: R10.84

## 2014-07-10 HISTORY — DX: Unspecified cirrhosis of liver: K74.60

## 2014-07-10 LAB — PRO B NATRIURETIC PEPTIDE: PRO B NATRI PEPTIDE: 39.1 pg/mL (ref 0–125)

## 2014-07-10 LAB — LIPASE, BLOOD: LIPASE: 28 U/L (ref 11–59)

## 2014-07-10 LAB — CBC
HCT: 46.1 % — ABNORMAL HIGH (ref 36.0–46.0)
Hemoglobin: 15.4 g/dL — ABNORMAL HIGH (ref 12.0–15.0)
MCH: 28.8 pg (ref 26.0–34.0)
MCHC: 33.4 g/dL (ref 30.0–36.0)
MCV: 86.2 fL (ref 78.0–100.0)
PLATELETS: 220 10*3/uL (ref 150–400)
RBC: 5.35 MIL/uL — AB (ref 3.87–5.11)
RDW: 14.6 % (ref 11.5–15.5)
WBC: 14.8 10*3/uL — AB (ref 4.0–10.5)

## 2014-07-10 LAB — HEPATIC FUNCTION PANEL
ALT: 13 U/L (ref 0–35)
AST: 29 U/L (ref 0–37)
Albumin: 3 g/dL — ABNORMAL LOW (ref 3.5–5.2)
Alkaline Phosphatase: 93 U/L (ref 39–117)
Total Bilirubin: 0.4 mg/dL (ref 0.3–1.2)
Total Protein: 7.5 g/dL (ref 6.0–8.3)

## 2014-07-10 LAB — DIFFERENTIAL
BASOS ABS: 0 10*3/uL (ref 0.0–0.1)
BASOS PCT: 0 % (ref 0–1)
Eosinophils Absolute: 0.1 10*3/uL (ref 0.0–0.7)
Eosinophils Relative: 1 % (ref 0–5)
Lymphocytes Relative: 29 % (ref 12–46)
Lymphs Abs: 4.3 10*3/uL — ABNORMAL HIGH (ref 0.7–4.0)
MONO ABS: 0.8 10*3/uL (ref 0.1–1.0)
MONOS PCT: 5 % (ref 3–12)
Neutro Abs: 9.4 10*3/uL — ABNORMAL HIGH (ref 1.7–7.7)
Neutrophils Relative %: 65 % (ref 43–77)

## 2014-07-10 LAB — I-STAT TROPONIN, ED: TROPONIN I, POC: 0 ng/mL (ref 0.00–0.08)

## 2014-07-10 LAB — I-STAT CG4 LACTIC ACID, ED: Lactic Acid, Venous: 2.08 mmol/L (ref 0.5–2.2)

## 2014-07-10 LAB — BASIC METABOLIC PANEL
Anion gap: 15 (ref 5–15)
BUN: 7 mg/dL (ref 6–23)
CHLORIDE: 94 meq/L — AB (ref 96–112)
CO2: 27 mEq/L (ref 19–32)
CREATININE: 0.62 mg/dL (ref 0.50–1.10)
Calcium: 9.1 mg/dL (ref 8.4–10.5)
GFR calc non Af Amer: >90 mL/min (ref >90)
Glucose, Bld: 140 mg/dL — ABNORMAL HIGH (ref 70–99)
Potassium: 4.2 mEq/L (ref 3.7–5.3)
Sodium: 136 mEq/L — ABNORMAL LOW (ref 137–147)

## 2014-07-10 LAB — URINALYSIS, ROUTINE W REFLEX MICROSCOPIC
Bilirubin Urine: NEGATIVE
Glucose, UA: NEGATIVE mg/dL
HGB URINE DIPSTICK: NEGATIVE
Ketones, ur: NEGATIVE mg/dL
Leukocytes, UA: NEGATIVE
NITRITE: NEGATIVE
PROTEIN: NEGATIVE mg/dL
Specific Gravity, Urine: 1.011 (ref 1.005–1.030)
Urobilinogen, UA: 1 mg/dL (ref 0.0–1.0)
pH: 7.5 (ref 5.0–8.0)

## 2014-07-10 LAB — POC URINE PREG, ED: Preg Test, Ur: NEGATIVE

## 2014-07-10 MED ORDER — PIPERACILLIN-TAZOBACTAM 3.375 G IVPB
3.3750 g | Freq: Three times a day (TID) | INTRAVENOUS | Status: DC
  Administered 2014-07-11 – 2014-07-12 (×5): 3.375 g via INTRAVENOUS
  Filled 2014-07-10 (×7): qty 50

## 2014-07-10 MED ORDER — HYDROMORPHONE HCL 1 MG/ML IJ SOLN
1.0000 mg | Freq: Once | INTRAMUSCULAR | Status: AC
  Administered 2014-07-10: 1 mg via INTRAVENOUS
  Filled 2014-07-10: qty 1

## 2014-07-10 MED ORDER — ONDANSETRON HCL 4 MG/2ML IJ SOLN
4.0000 mg | Freq: Once | INTRAMUSCULAR | Status: AC
  Administered 2014-07-10: 4 mg via INTRAVENOUS
  Filled 2014-07-10: qty 2

## 2014-07-10 MED ORDER — PROMETHAZINE HCL 25 MG/ML IJ SOLN
25.0000 mg | Freq: Four times a day (QID) | INTRAMUSCULAR | Status: DC | PRN
  Administered 2014-07-11 – 2014-07-12 (×4): 25 mg via INTRAVENOUS
  Filled 2014-07-10 (×6): qty 1

## 2014-07-10 MED ORDER — IOHEXOL 300 MG/ML  SOLN
25.0000 mL | INTRAMUSCULAR | Status: AC
  Administered 2014-07-10: 25 mL via ORAL

## 2014-07-10 MED ORDER — SODIUM CHLORIDE 0.9 % IV BOLUS (SEPSIS)
1000.0000 mL | Freq: Once | INTRAVENOUS | Status: AC
  Administered 2014-07-10: 1000 mL via INTRAVENOUS

## 2014-07-10 MED ORDER — HYDROMORPHONE HCL 1 MG/ML IJ SOLN
0.5000 mg | INTRAMUSCULAR | Status: DC | PRN
  Administered 2014-07-11 (×3): 1 mg via INTRAVENOUS
  Filled 2014-07-10 (×3): qty 1

## 2014-07-10 MED ORDER — PROMETHAZINE HCL 25 MG RE SUPP
25.0000 mg | Freq: Four times a day (QID) | RECTAL | Status: DC | PRN
  Filled 2014-07-10: qty 1

## 2014-07-10 MED ORDER — HYDROMORPHONE HCL 1 MG/ML IJ SOLN
0.5000 mg | Freq: Once | INTRAMUSCULAR | Status: AC
  Administered 2014-07-10: 0.5 mg via INTRAVENOUS
  Filled 2014-07-10: qty 1

## 2014-07-10 MED ORDER — HEPARIN SODIUM (PORCINE) 5000 UNIT/ML IJ SOLN
5000.0000 [IU] | Freq: Three times a day (TID) | INTRAMUSCULAR | Status: DC
  Administered 2014-07-11 – 2014-07-12 (×6): 5000 [IU] via SUBCUTANEOUS
  Filled 2014-07-10 (×8): qty 1

## 2014-07-10 MED ORDER — SODIUM CHLORIDE 0.9 % IV SOLN
INTRAVENOUS | Status: DC
  Administered 2014-07-11: via INTRAVENOUS

## 2014-07-10 MED ORDER — ONDANSETRON HCL 4 MG/2ML IJ SOLN
4.0000 mg | Freq: Four times a day (QID) | INTRAMUSCULAR | Status: DC
  Administered 2014-07-11 – 2014-07-12 (×7): 4 mg via INTRAVENOUS
  Filled 2014-07-10 (×8): qty 2

## 2014-07-10 MED ORDER — PIPERACILLIN-TAZOBACTAM 3.375 G IVPB 30 MIN
3.3750 g | Freq: Once | INTRAVENOUS | Status: AC
  Administered 2014-07-11: 3.375 g via INTRAVENOUS
  Filled 2014-07-10: qty 50

## 2014-07-10 NOTE — ED Provider Notes (Signed)
CSN: [id number]     Arrival date & time 07/10/14  1341 History   First MD Initiated Contact with Patient 07/10/14 1658     Chief Complaint  Patient presents with  . Chest Pain    Patient is a 53 y.o. female presenting with abdominal pain. The history is provided by the patient.  Abdominal Pain Pain location:  Generalized Pain quality: bloating, pressure, sharp and shooting   Pain radiates to:  Does not radiate Pain severity:  Severe Onset quality:  Gradual Duration:  7 days Timing:  Intermittent Progression:  Worsening Chronicity:  New Relieved by:  None tried Worsened by:  Eating Ineffective treatments:  None tried Associated symptoms: chest pain (radiating from abdomen to underneath left and right breast), flatus, nausea and vomiting   Associated symptoms: no anorexia, no chills, no cough, no diarrhea, no dysuria, no fever and no shortness of breath   Chest pain:    Quality:  Aching   Severity:  Severe   Onset quality:  Gradual   Duration:  7 days   Timing:  Intermittent   Progression:  Waxing and waning   Chronicity:  New Risk factors: multiple surgeries and obesity   Risk factors: not pregnant     Past Medical History  Diagnosis Date  . Obesity   . Hypertension   . GERD (gastroesophageal reflux disease)   . Chronic pain   . Pulmonary embolism   . COPD (chronic obstructive pulmonary disease)   . Tobacco abuse   . CAD (coronary artery disease)   . Spondylolisthesis, grade 2   . Deviated septum   . Perforated bowel   . Diabetes mellitus without complication   . Hypercholesterolemia   . PONV (postoperative nausea and vomiting)   . Depression   . Shortness of breath   . Asthma   . Headache(784.0)     migraines  . Arthritis   . Sleep apnea    Past Surgical History  Procedure Laterality Date  . Abdominal hysterectomy    . Colon surgery    . Back surgery      lumbar  . Patella-femoral arthroplasty Left 03/07/2013    Procedure: LEFT PATELLA-FEMORAL  ARTHROPLASTY;  Surgeon: Mauri Pole, MD;  Location: [facility];  Service: Orthopedics;  Laterality: Left;  . Irrigation and debridement knee Left 03/14/2013    Procedure: IRRIGATION AND DEBRIDEMENT  and Closure of wound left KNEE;  Surgeon: Mauri Pole, MD;  Location: [facility];  Service: Orthopedics;  Laterality: Left;   History reviewed. No pertinent family history. History  Substance Use Topics  . Smoking status: Current Every Day Smoker -- 1.00 packs/day    Types: Cigarettes  . Smokeless tobacco: Never Used  . Alcohol Use: No   OB History   Grav Para Term Preterm Abortions TAB SAB Ect Mult Living                 Review of Systems  Constitutional: Negative for fever and chills.  Respiratory: Negative for cough and shortness of breath.   Cardiovascular: Positive for chest pain (radiating from abdomen to underneath left and right breast).  Gastrointestinal: Positive for nausea, vomiting, abdominal pain, abdominal distention and flatus. Negative for diarrhea, blood in stool and anorexia.  Genitourinary: Negative for dysuria.  All other systems reviewed and are negative.   Allergies  Amoxicillin; Clindamycin/lincomycin; Doxycycline; Treximet; and Bactrim  Home Medications   Prior to Admission medications   Medication Sig Start Date End Date Taking? Authorizing Provider  alprazolam (XANAX) 2 MG tablet Take 2-4 mg by mouth 2 (two) times daily. Takes 1 tablet in the morning and 2 tablets at bedtime   Yes Historical Provider, MD  metFORMIN (GLUCOPHAGE) 500 MG tablet Take 1,000 mg by mouth 2 (two) times daily with a meal.    Yes Historical Provider, MD  simvastatin (ZOCOR) 20 MG tablet Take 20 mg by mouth at bedtime.   Yes Historical Provider, MD  albuterol (PROVENTIL HFA;VENTOLIN HFA) 108 (90 BASE) MCG/ACT inhaler Inhale 2 puffs into the lungs every 4 (four) hours as needed. Shortness of breath/wheezing.    Historical Provider, MD  bumetanide (BUMEX) 1 MG tablet Take 2 mg by mouth 2  (two) times daily.     Historical Provider, MD  esomeprazole (NEXIUM) 40 MG capsule Take 40 mg by mouth 2 (two) times daily.     Historical Provider, MD  estradiol (ESTRACE) 2 MG tablet Take 2 mg by mouth daily.     Historical Provider, MD  fluticasone (FLONASE) 50 MCG/ACT nasal spray Place 2 sprays into the nose at bedtime as needed for allergies.     Historical Provider, MD  lidocaine (XYLOCAINE) 5 % ointment Apply 1 application topically daily. To hemmorroids    Historical Provider, MD  nitroGLYCERIN (NITROSTAT) 0.4 MG SL tablet Place 0.4 mg under the tongue every 5 (five) minutes as needed for chest pain.    Historical Provider, MD  nystatin (NYSTOP) 100000 UNIT/GM POWD Apply 1 g topically 3 (three) times daily as needed. To rashy areas    Historical Provider, MD  olopatadine (PATANOL) 0.1 % ophthalmic solution Place 1 drop into both eyes as needed for allergies.     Historical Provider, MD  polyethylene glycol (MIRALAX / GLYCOLAX) packet Take 17 g by mouth 2 (two) times daily as needed (for constipation). 03/09/13   Lucille Passy Babish, PA-C  potassium chloride (K-DUR) 10 MEQ tablet Take 30 mEq by mouth 2 (two) times daily.    Historical Provider, MD  promethazine (PHENERGAN) 25 MG tablet Take 25 mg by mouth every 6 (six) hours as needed. nausea    Historical Provider, MD  Simethicone (PHAZYME) 180 MG CAPS Take 1 capsule by mouth 4 (four) times daily as needed. For gas/bloating    Historical Provider, MD   BP 124/64  Pulse 99  Temp(Src) 97.9 F (36.6 C) (Oral)  Resp 22  Ht 5\' 2"  (1.575 m)  Wt 226 lb (102.513 kg)  BMI 41.33 kg/m2  SpO2 96%  Physical Exam  Vitals reviewed. Constitutional: She is oriented to person, place, and time. She appears well-developed and well-nourished. She is cooperative. No distress.  HENT:  Head: Normocephalic and atraumatic.  Right Ear: External ear normal.  Left Ear: External ear normal.  Mouth/Throat: Oropharynx is clear and moist.  Neck: Normal range  of motion and phonation normal.  Cardiovascular: Normal rate and regular rhythm.   Pulmonary/Chest: Effort normal and breath sounds normal. No respiratory distress. She has no wheezes. She has no rales.  Abdominal: Soft. She exhibits distension. There is tenderness. There is no rebound and no guarding.  Neurological: She is alert and oriented to person, place, and time.  Skin: Skin is warm and dry. No rash noted. She is not diaphoretic.    ED Course  Procedures   Labs Review  Results for orders placed during the hospital encounter of 07/10/14  CBC      Result Value Ref Range   WBC 14.8 (*) 4.0 - 10.5 K/uL  RBC 5.35 (*) 3.87 - 5.11 MIL/uL   Hemoglobin 15.4 (*) 12.0 - 15.0 g/dL   HCT 46.1 (*) 36.0 - 46.0 %   MCV 86.2  78.0 - 100.0 fL   MCH 28.8  26.0 - 34.0 pg   MCHC 33.4  30.0 - 36.0 g/dL   RDW 14.6  11.5 - 15.5 %   Platelets 220  150 - 400 K/uL  BASIC METABOLIC PANEL      Result Value Ref Range   Sodium 136 (*) 137 - 147 mEq/L   Potassium 4.2  3.7 - 5.3 mEq/L   Chloride 94 (*) 96 - 112 mEq/L   CO2 27  19 - 32 mEq/L   Glucose, Bld 140 (*) 70 - 99 mg/dL   BUN 7  6 - 23 mg/dL   Creatinine, Ser 0.62  0.50 - 1.10 mg/dL   Calcium 9.1  8.4 - 10.5 mg/dL   GFR calc non Af Amer >90  >90 mL/min   GFR calc Af Amer >90  >90 mL/min   Anion gap 15  5 - 15  PRO B NATRIURETIC PEPTIDE      Result Value Ref Range   Pro B Natriuretic peptide (BNP) 39.1  0 - 125 pg/mL  HEPATIC FUNCTION PANEL      Result Value Ref Range   Total Protein 7.5  6.0 - 8.3 g/dL   Albumin 3.0 (*) 3.5 - 5.2 g/dL   AST 29  0 - 37 U/L   ALT 13  0 - 35 U/L   Alkaline Phosphatase 93  39 - 117 U/L   Total Bilirubin 0.4  0.3 - 1.2 mg/dL   Bilirubin, Direct <0.2  0.0 - 0.3 mg/dL   Indirect Bilirubin NOT CALCULATED  0.3 - 0.9 mg/dL  LIPASE, BLOOD      Result Value Ref Range   Lipase 28  11 - 59 U/L  URINALYSIS, ROUTINE W REFLEX MICROSCOPIC      Result Value Ref Range   Color, Urine YELLOW  YELLOW   APPearance  CLEAR  CLEAR   Specific Gravity, Urine 1.011  1.005 - 1.030   pH 7.5  5.0 - 8.0   Glucose, UA NEGATIVE  NEGATIVE mg/dL   Hgb urine dipstick NEGATIVE  NEGATIVE   Bilirubin Urine NEGATIVE  NEGATIVE   Ketones, ur NEGATIVE  NEGATIVE mg/dL   Protein, ur NEGATIVE  NEGATIVE mg/dL   Urobilinogen, UA 1.0  0.0 - 1.0 mg/dL   Nitrite NEGATIVE  NEGATIVE   Leukocytes, UA NEGATIVE  NEGATIVE  DIFFERENTIAL      Result Value Ref Range   Neutrophils Relative % 65  43 - 77 %   Neutro Abs 9.4 (*) 1.7 - 7.7 K/uL   Lymphocytes Relative 29  12 - 46 %   Lymphs Abs 4.3 (*) 0.7 - 4.0 K/uL   Monocytes Relative 5  3 - 12 %   Monocytes Absolute 0.8  0.1 - 1.0 K/uL   Eosinophils Relative 1  0 - 5 %   Eosinophils Absolute 0.1  0.0 - 0.7 K/uL   Basophils Relative 0  0 - 1 %   Basophils Absolute 0.0  0.0 - 0.1 K/uL  CBC      Result Value Ref Range   WBC 13.2 (*) 4.0 - 10.5 K/uL   RBC 4.65  3.87 - 5.11 MIL/uL   Hemoglobin 13.2  12.0 - 15.0 g/dL   HCT 39.7  36.0 - 46.0 %   MCV 85.4  78.0 -  100.0 fL   MCH 28.4  26.0 - 34.0 pg   MCHC 33.2  30.0 - 36.0 g/dL   RDW 14.7  11.5 - 15.5 %   Platelets 166  150 - 400 K/uL  COMPREHENSIVE METABOLIC PANEL      Result Value Ref Range   Sodium 135 (*) 137 - 147 mEq/L   Potassium 3.5 (*) 3.7 - 5.3 mEq/L   Chloride 96  96 - 112 mEq/L   CO2 28  19 - 32 mEq/L   Glucose, Bld 102 (*) 70 - 99 mg/dL   BUN 7  6 - 23 mg/dL   Creatinine, Ser 0.63  0.50 - 1.10 mg/dL   Calcium 8.4  8.4 - 10.5 mg/dL   Total Protein 7.3  6.0 - 8.3 g/dL   Albumin 3.1 (*) 3.5 - 5.2 g/dL   AST 25  0 - 37 U/L   ALT 12  0 - 35 U/L   Alkaline Phosphatase 96  39 - 117 U/L   Total Bilirubin 0.6  0.3 - 1.2 mg/dL   GFR calc non Af Amer >90  >90 mL/min   GFR calc Af Amer >90  >90 mL/min   Anion gap 11  5 - 15  DIFFERENTIAL      Result Value Ref Range   Neutrophils Relative % 63  43 - 77 %   Neutro Abs 8.3 (*) 1.7 - 7.7 K/uL   Lymphocytes Relative 30  12 - 46 %   Lymphs Abs 3.9  0.7 - 4.0 K/uL    Monocytes Relative 7  3 - 12 %   Monocytes Absolute 0.9  0.1 - 1.0 K/uL   Eosinophils Relative 1  0 - 5 %   Eosinophils Absolute 0.2  0.0 - 0.7 K/uL   Basophils Relative 0  0 - 1 %   Basophils Absolute 0.0  0.0 - 0.1 K/uL  I-STAT TROPOININ, ED      Result Value Ref Range   Troponin i, poc 0.00  0.00 - 0.08 ng/mL   Comment 3           I-STAT CG4 LACTIC ACID, ED      Result Value Ref Range   Lactic Acid, Venous 2.08  0.5 - 2.2 mmol/L  POC URINE PREG, ED      Result Value Ref Range   Preg Test, Ur NEGATIVE  NEGATIVE    Imaging Review Dg Chest 2 View  07/10/2014   CLINICAL DATA:  Bilateral chest pain for 1 day. Epigastric pain for 6 days. Vomiting.  EXAM: CHEST  2 VIEW  COMPARISON:  03/18/2013  FINDINGS: The cardiac silhouette, mediastinal and hilar contours are within normal limits and stable. There are chronic bronchitic type interstitial lung changes with peribronchial thickening, increased interstitial markings and pulmonary scarring. No pleural effusion or focal airspace consolidation. The bony thorax is intact.  IMPRESSION: Chronic lung changes but no acute pulmonary findings.   Electronically Signed   By: Kalman Jewels M.D.   On: 07/10/2014 15:29   Ct Abdomen Pelvis W Contrast  07/10/2014   CLINICAL DATA:  Generalized abdominal pain.  EXAM: CT ABDOMEN AND PELVIS WITH CONTRAST  TECHNIQUE: Multidetector CT imaging of the abdomen and pelvis was performed using the standard protocol following bolus administration of intravenous contrast.  CONTRAST:  100 mL of Omnipaque 300 intravenously.  COMPARISON:  CT scan of December 24, 2012.  FINDINGS: Bilateral pars defects are again noted at L5. No significant abnormality is noted in visualized  lung bases.  Gallbladder is distended without gallstones. Nodular hepatic margins are noted concerning for hepatic cirrhosis. No definite mass lesion is noted within the hepatic parenchyma. The spleen and pancreas appear normal. Adrenal glands and kidneys appear  normal. No hydronephrosis or renal obstruction is noted. Mild ascites is noted around the liver, spleen as well as extending down both pericolic gutters into the pelvis. Mildly dilated small bowel loops are noted proximally without definite transition zone. Urinary bladder appears normal. Status post hysterectomy. Mildly enlarged lymph nodes are noted in the porta hepatis region most likely related to hepatic cirrhosis. Stool is noted throughout the colon.  IMPRESSION: Gallbladder distention is noted without cholelithiasis.  Hepatic cirrhosis is noted with mild ascites seen.  Mildly dilated proximal small bowel loops are noted without definite transition zone. It is uncertain if this represents focal ileus, or possibly partial small bowel obstruction. Followup radiographs are recommended.   Electronically Signed   By: Sabino Dick M.D.   On: 07/10/2014 20:20     EKG Interpretation   Date/Time:  Monday July 10 2014 13:45:33 EDT Ventricular Rate:  108 PR Interval:  130 QRS Duration: 84 QT Interval:  352 QTC Calculation: 471 R Axis:   96 Text Interpretation:  Sinus tachycardia Rightward axis Borderline ECG When  compared with ECG of 03/01/2013, Nonspecific T wave abnormality is no  longer Present Confirmed by [facility]  MD, DAVID ([id number]) on 07/10/2014 4:34:44  PM      MDM   Final diagnoses:  Abdominal pain    53 y.o. female with a past medical history of obesity, hypertension, chronic pain, PE, COPD, CAD, small bowel obstruction, depression. Also has a history of multiple abdominal surgeries. Presents to the ED due to abdominal pain and nausea for the last 7 days. She became acutely worse last night around 6 PM and actually began vomiting. This was after a meal.  Exam as above. Concern for small bowel obstruction, partial bowel obstruction, ileus. Considered mesenteric ischemia, AAA. Doubt mesenteric ischemia given normal lactic acid level. Doubt AAA given no evidence of AAA on imaging one  year ago.  Labs obtained. CBC notable for a leukocytosis, no anemia, normal platelet count. BMP, Hepatic Function Panel, and Lipase essentially unremarkable. Troponin and BNP within normal limits. Urine shows no sign of infection.  Decision was made to obtain a CT scan of her abdomen and pelvis with contrast given her history of small bowel obstruction and concern for intra-abdominal pathology. CT concerning for focal ileus or partial small bowel obstruction. Chest x-ray showed no acute findings.  The patient was given multiple doses of IV pain control as well as IV antibiotics. Medicine was consult for admission. The hospitalist on evaluated the patient and agreed with admission. She was admitted to the hospitalist.  This case was managed in conjunction with my attending, Dr. Roxanne Mins.    Berenice Primas, MD 07/11/14 [phone]

## 2014-07-10 NOTE — ED Provider Notes (Signed)
53 year old female history of small bowel obstruction comes in with abdominal pain and distention and nausea and vomiting. Symptoms are similar to what she had with prior bowel obstruction. She did have a small bowel movement and passed a small amount of flatus prior to come to the ED and is to give her some relief. On exam, abdomen is distended but soft with a diffuse tenderness. Bowel sounds are decreased in high-pitched tinkling suggestive of partial small bowel obstruction. She will be sent for CT scan.  I saw and evaluated the patient, reviewed the resident's note and I agree with the findings and plan.   EKG Interpretation   Date/Time:  Monday July 10 2014 13:45:33 EDT Ventricular Rate:  108 PR Interval:  130 QRS Duration: 84 QT Interval:  352 QTC Calculation: 471 R Axis:   96 Text Interpretation:  Sinus tachycardia Rightward axis Borderline ECG When  compared with ECG of 03/01/2013, Nonspecific T wave abnormality is no  longer Present Confirmed by [facility]  MD, Magally Vahle ([id number]) on 07/10/2014 4:34:44  PM        Delora Fuel, MD 60/10/93 [id number]

## 2014-07-10 NOTE — H&P (Signed)
Hospitalist Admission History and Physical  Patient name: [name] Medical record number: [id number] Date of birth: 01-07-1961 Age: 53 y.o. Gender: female  Primary Care Provider: Shirline Frees, MD  Chief Complaint: abd pain, ileus, cholecystis  History of Present Illness:This is a 53 y.o. year old female with significant past medical history of morbid obesity, recurrent ileus, coronary artery disease, hypertension, COPD presenting with abdominal pain, ileus, cholecystitis. Patient reports 3-4 days of progressive abdominal pain. Patient felt like she was having possible early onset of ileus earlier last week. Patient tried to change her diet and take MiraLAX. Patient states that symptoms mildly improved. However, over the course of the weekend patient developed progressive severe abdominal pain as well as nausea and vomiting. Has been trying to eat a bland diet. States that she was able to pass some gas earlier today. Presented to ER MAXIMUM TEMPERATURE 99.1, heart rate in the 90s to 110s, respirations in the tens to 20s, blood pressure in the 100s to 130s over 50s to 70s, satting greater than 91% on room air. White blood cell count 14.8, hemoglobin 15.4, creatinine 0.62. Lipase within normal limits at 28. Lactate 2.1. AST ALC within normal limits. Alkaline phosphatase 93. CT of the abdomen and pelvis obtained showing gallbladder distention without cholelithiasis, hepatic cirrhosis with moderate ascites, mildly dilated proximal small bowel loops concerning for ileus versus small bowel obstruction. I have asked the ER physician to consult surgery about the case. Assessment and Plan: [name] is a 53 y.o. year old female presenting with abd pain, ileus, cholecystitis   Active Problems:   Abdominal pain, generalized   Ileus   Cholecystitis   1- Abd pain  -NPO -IVFs  -pain control -surgery consulted- follow up recs  -noted GB distension with palpable RUQ tenderness. Some concern for GB  disease  -may need abd u/s  -noted leukocytosis -zosyn overnight pending surgery recs  -consider GI consult/follow up given noted hepatic cirrhosis   2- DM -SSI, A1C   3- CAD/HTN -no active CP  -BP stable  -follow   FEN/GI: NPO for now, PPI  Prophylaxis: sub q heparin  Disposition: pending further evaluation  Code Status:Full Code    Patient Active Problem List   Diagnosis Date Noted  . Abdominal pain, generalized 07/10/2014  . Diabetes 03/18/2013  . Acidosis 03/17/2013  . Dehiscence of surgical wound left knee 03/16/2013  . Hypokalemia 03/16/2013  . Morbid obesity 03/09/2013  . S/P left PF UKR 03/07/2013  . Spondylolisthesis, grade 2   . Deviated septum   . Sleep apnea   . Respiratory failure, acute-on-chronic 12/11/2010  . Chronic pain   . Tobacco abuse   . CAD (coronary artery disease)   . Hypertension   . COPD (chronic obstructive pulmonary disease)    Past Medical History: Past Medical History  Diagnosis Date  . Obesity   . Hypertension   . GERD (gastroesophageal reflux disease)   . Chronic pain   . Pulmonary embolism   . COPD (chronic obstructive pulmonary disease)   . Tobacco abuse   . CAD (coronary artery disease)   . Spondylolisthesis, grade 2   . Deviated septum   . Perforated bowel   . Diabetes mellitus without complication   . Hypercholesterolemia   . PONV (postoperative nausea and vomiting)   . Depression   . Shortness of breath   . Asthma   . Headache(784.0)     migraines  . Arthritis   . Sleep apnea  Past Surgical History: Past Surgical History  Procedure Laterality Date  . Abdominal hysterectomy    . Colon surgery    . Back surgery      lumbar  . Patella-femoral arthroplasty Left 03/07/2013    Procedure: LEFT PATELLA-FEMORAL ARTHROPLASTY;  Surgeon: Mauri Pole, MD;  Location: [facility];  Service: Orthopedics;  Laterality: Left;  . Irrigation and debridement knee Left 03/14/2013    Procedure: IRRIGATION AND DEBRIDEMENT  and  Closure of wound left KNEE;  Surgeon: Mauri Pole, MD;  Location: [facility];  Service: Orthopedics;  Laterality: Left;    Social History: History   Social History  . Marital Status: Married    Spouse Name: N/A    Number of Children: N/A  . Years of Education: N/A   Social History Main Topics  . Smoking status: Current Every Day Smoker -- 1.00 packs/day    Types: Cigarettes  . Smokeless tobacco: Never Used  . Alcohol Use: No  . Drug Use: No  . Sexual Activity: No   Other Topics Concern  . None   Social History Narrative  . None    Family History: History reviewed. No pertinent family history.  Allergies: Allergies  Allergen Reactions  . Amoxicillin Nausea Only    6/14- states has taken Amoxicillin and no reaction.   . Clindamycin/Lincomycin Nausea And Vomiting  . Doxycycline Nausea And Vomiting and Nausea Only  . Treximet [Sumatriptan-Naproxen Sodium] Nausea And Vomiting and Nausea Only  . Bactrim Hives and Rash    Current Facility-Administered Medications  Medication Dose Route Frequency Provider Last Rate Last Dose  . 0.9 %  sodium chloride infusion   Intravenous Continuous [name] Howells, MD      . heparin injection 5,000 Units  5,000 Units Subcutaneous 3 times per day [name] Howells, MD      . HYDROmorphone (DILAUDID) injection 0.5-1 mg  0.5-1 mg Intravenous Q3H PRN [name] Howells, MD      . Derrill Memo ON 07/11/2014] ondansetron [facility]) injection 4 mg  4 mg Intravenous 4 times per day [name] Howells, MD      . promethazine (PHENERGAN) injection 25 mg  25 mg Intravenous Q6H PRN [name] Howells, MD       Or  . promethazine (PHENERGAN) suppository 25 mg  25 mg Rectal Q6H PRN [name] Howells, MD       Current Outpatient Prescriptions  Medication Sig Dispense Refill  . alprazolam (XANAX) 2 MG tablet Take 2-4 mg by mouth 2 (two) times daily. Takes 1 tablet in the morning and 2 tablets at bedtime      . bumetanide (BUMEX) 1 MG tablet Take 1-3 mg by mouth once.       .  cyclobenzaprine (FLEXERIL) 10 MG tablet Take 10 mg by mouth 3 (three) times daily as needed for muscle spasms.      Marland Kitchen esomeprazole (NEXIUM) 40 MG capsule Take 40 mg by mouth 2 (two) times daily.       Marland Kitchen estradiol (ESTRACE) 2 MG tablet Take 2 mg by mouth daily.       . fluticasone (FLONASE) 50 MCG/ACT nasal spray Place 2 sprays into the nose at bedtime as needed for allergies.       Marland Kitchen lidocaine (XYLOCAINE) 5 % ointment Apply 1 application topically daily. To hemmorroids      . metFORMIN (GLUCOPHAGE) 500 MG tablet Take 1,000 mg by mouth 2 (two) times daily with a meal.       . nystatin (NYSTOP) 100000 UNIT/GM  POWD Apply 1 g topically 3 (three) times daily as needed. To rashy areas      . oxyCODONE-acetaminophen (PERCOCET) 10-325 MG per tablet Take 1 tablet by mouth every 4 (four) hours as needed for pain.      . polyethylene glycol (MIRALAX / GLYCOLAX) packet Take 17 g by mouth 2 (two) times daily as needed (for constipation).      . potassium chloride (K-DUR) 10 MEQ tablet Take 10 mEq by mouth as needed (when leg cramps). Only takes it when needed.  Patient states she can tell when her potassium is low when she cramps and will take 1 tablet.      . promethazine (PHENERGAN) 25 MG tablet Take 25 mg by mouth every 6 (six) hours as needed. nausea      . Simethicone (PHAZYME) 180 MG CAPS Take 1 capsule by mouth 4 (four) times daily as needed. For gas/bloating      . simvastatin (ZOCOR) 20 MG tablet Take 20 mg by mouth at bedtime.      Marland Kitchen albuterol (PROVENTIL HFA;VENTOLIN HFA) 108 (90 BASE) MCG/ACT inhaler Inhale 2 puffs into the lungs every 4 (four) hours as needed. Shortness of breath/wheezing.      . nitroGLYCERIN (NITROSTAT) 0.4 MG SL tablet Place 0.4 mg under the tongue every 5 (five) minutes as needed for chest pain.      Marland Kitchen olopatadine (PATANOL) 0.1 % ophthalmic solution Place 1 drop into both eyes as needed for allergies.        Review Of Systems: 12 point ROS negative except as noted above in  HPI.  Physical Exam: Filed Vitals:   07/10/14 2325  BP: 137/71  Pulse: 96  Temp:   Resp: 18    General: alert and morbidly obese HEENT: PERRLA and extra ocular movement intact Heart: S1, S2 normal, no murmur, rub or gallop, regular rate and rhythm Lungs: clear to auscultation, no wheezes or rales and unlabored breathing Abdomen: + decreased bowel sounds, + generalized abd TTP-most prominent in lower abd and RUQ  Extremities: extremities normal, atraumatic, no cyanosis or edema Skin:no rashes Neurology: normal without focal findings  Labs and Imaging: Lab Results  Component Value Date/Time   NA 136* 07/10/2014  1:49 PM   K 4.2 07/10/2014  1:49 PM   CL 94* 07/10/2014  1:49 PM   CO2 27 07/10/2014  1:49 PM   BUN 7 07/10/2014  1:49 PM   CREATININE 0.62 07/10/2014  1:49 PM   GLUCOSE 140* 07/10/2014  1:49 PM   Lab Results  Component Value Date   WBC 14.8* 07/10/2014   HGB 15.4* 07/10/2014   HCT 46.1* 07/10/2014   MCV 86.2 07/10/2014   PLT 220 07/10/2014    Dg Chest 2 View  07/10/2014   CLINICAL DATA:  Bilateral chest pain for 1 day. Epigastric pain for 6 days. Vomiting.  EXAM: CHEST  2 VIEW  COMPARISON:  03/18/2013  FINDINGS: The cardiac silhouette, mediastinal and hilar contours are within normal limits and stable. There are chronic bronchitic type interstitial lung changes with peribronchial thickening, increased interstitial markings and pulmonary scarring. No pleural effusion or focal airspace consolidation. The bony thorax is intact.  IMPRESSION: Chronic lung changes but no acute pulmonary findings.   Electronically Signed   By: Kalman Jewels M.D.   On: 07/10/2014 15:29   Ct Abdomen Pelvis W Contrast  07/10/2014   CLINICAL DATA:  Generalized abdominal pain.  EXAM: CT ABDOMEN AND PELVIS WITH CONTRAST  TECHNIQUE: Multidetector CT  imaging of the abdomen and pelvis was performed using the standard protocol following bolus administration of intravenous contrast.  CONTRAST:   100 mL of Omnipaque 300 intravenously.  COMPARISON:  CT scan of December 24, 2012.  FINDINGS: Bilateral pars defects are again noted at L5. No significant abnormality is noted in visualized lung bases.  Gallbladder is distended without gallstones. Nodular hepatic margins are noted concerning for hepatic cirrhosis. No definite mass lesion is noted within the hepatic parenchyma. The spleen and pancreas appear normal. Adrenal glands and kidneys appear normal. No hydronephrosis or renal obstruction is noted. Mild ascites is noted around the liver, spleen as well as extending down both pericolic gutters into the pelvis. Mildly dilated small bowel loops are noted proximally without definite transition zone. Urinary bladder appears normal. Status post hysterectomy. Mildly enlarged lymph nodes are noted in the porta hepatis region most likely related to hepatic cirrhosis. Stool is noted throughout the colon.  IMPRESSION: Gallbladder distention is noted without cholelithiasis.  Hepatic cirrhosis is noted with mild ascites seen.  Mildly dilated proximal small bowel loops are noted without definite transition zone. It is uncertain if this represents focal ileus, or possibly partial small bowel obstruction. Followup radiographs are recommended.   Electronically Signed   By: Sabino Dick M.D.   On: 07/10/2014 20:20           [name] Howells MD  Pager: [phone]

## 2014-07-10 NOTE — ED Notes (Signed)
CT Scan notified re: completion of oral contrast

## 2014-07-10 NOTE — ED Notes (Signed)
Patient transported to CT 

## 2014-07-10 NOTE — ED Notes (Signed)
Pt reports CP under bilateral breasts starting at 6pm yesterday, a/w sob and n/v

## 2014-07-10 NOTE — ED Notes (Signed)
I Stat Lactic Acid results shown to Dr. Peggye Pitt

## 2014-07-10 NOTE — Progress Notes (Signed)
53yo female w/ h/o SBO presents w/ abdominal pain and distention, CT cannot exclude new SBO, to begin IV ABX (amox listed as allergy but only N/V).  Will start Zosyn 3.375g IV Q8H for CrCl 90 ml/min and monitor CBC and Cx.  Wynona Neat, PharmD, BCPS 07/10/2014 11:36 PM

## 2014-07-11 ENCOUNTER — Encounter (HOSPITAL_COMMUNITY): Payer: Self-pay | Admitting: General Surgery

## 2014-07-11 DIAGNOSIS — E881 Lipodystrophy, not elsewhere classified: Secondary | ICD-10-CM | POA: Diagnosis present

## 2014-07-11 DIAGNOSIS — D72829 Elevated white blood cell count, unspecified: Secondary | ICD-10-CM | POA: Diagnosis present

## 2014-07-11 DIAGNOSIS — I10 Essential (primary) hypertension: Secondary | ICD-10-CM | POA: Diagnosis present

## 2014-07-11 DIAGNOSIS — Z86711 Personal history of pulmonary embolism: Secondary | ICD-10-CM | POA: Diagnosis not present

## 2014-07-11 DIAGNOSIS — Z86010 Personal history of colonic polyps: Secondary | ICD-10-CM | POA: Diagnosis not present

## 2014-07-11 DIAGNOSIS — J45909 Unspecified asthma, uncomplicated: Secondary | ICD-10-CM | POA: Diagnosis present

## 2014-07-11 DIAGNOSIS — E119 Type 2 diabetes mellitus without complications: Secondary | ICD-10-CM | POA: Diagnosis present

## 2014-07-11 DIAGNOSIS — K7460 Unspecified cirrhosis of liver: Secondary | ICD-10-CM | POA: Diagnosis not present

## 2014-07-11 DIAGNOSIS — K7581 Nonalcoholic steatohepatitis (NASH): Secondary | ICD-10-CM | POA: Diagnosis present

## 2014-07-11 DIAGNOSIS — G8929 Other chronic pain: Secondary | ICD-10-CM | POA: Diagnosis present

## 2014-07-11 DIAGNOSIS — R1084 Generalized abdominal pain: Secondary | ICD-10-CM

## 2014-07-11 DIAGNOSIS — J449 Chronic obstructive pulmonary disease, unspecified: Secondary | ICD-10-CM | POA: Diagnosis present

## 2014-07-11 DIAGNOSIS — Z6841 Body Mass Index (BMI) 40.0 and over, adult: Secondary | ICD-10-CM | POA: Diagnosis not present

## 2014-07-11 DIAGNOSIS — Z880 Allergy status to penicillin: Secondary | ICD-10-CM | POA: Diagnosis not present

## 2014-07-11 DIAGNOSIS — K740 Hepatic fibrosis: Secondary | ICD-10-CM | POA: Diagnosis not present

## 2014-07-11 DIAGNOSIS — K819 Cholecystitis, unspecified: Secondary | ICD-10-CM | POA: Diagnosis present

## 2014-07-11 DIAGNOSIS — K567 Ileus, unspecified: Secondary | ICD-10-CM | POA: Diagnosis not present

## 2014-07-11 DIAGNOSIS — G4730 Sleep apnea, unspecified: Secondary | ICD-10-CM | POA: Diagnosis present

## 2014-07-11 DIAGNOSIS — T40605A Adverse effect of unspecified narcotics, initial encounter: Secondary | ICD-10-CM | POA: Diagnosis present

## 2014-07-11 DIAGNOSIS — M1990 Unspecified osteoarthritis, unspecified site: Secondary | ICD-10-CM | POA: Diagnosis present

## 2014-07-11 DIAGNOSIS — R110 Nausea: Secondary | ICD-10-CM | POA: Diagnosis not present

## 2014-07-11 DIAGNOSIS — Z79899 Other long term (current) drug therapy: Secondary | ICD-10-CM | POA: Diagnosis not present

## 2014-07-11 DIAGNOSIS — F17210 Nicotine dependence, cigarettes, uncomplicated: Secondary | ICD-10-CM | POA: Diagnosis present

## 2014-07-11 DIAGNOSIS — K5900 Constipation, unspecified: Secondary | ICD-10-CM | POA: Diagnosis not present

## 2014-07-11 DIAGNOSIS — Z888 Allergy status to other drugs, medicaments and biological substances status: Secondary | ICD-10-CM | POA: Diagnosis not present

## 2014-07-11 DIAGNOSIS — K660 Peritoneal adhesions (postprocedural) (postinfection): Secondary | ICD-10-CM | POA: Diagnosis present

## 2014-07-11 DIAGNOSIS — M549 Dorsalgia, unspecified: Secondary | ICD-10-CM | POA: Diagnosis present

## 2014-07-11 DIAGNOSIS — G43909 Migraine, unspecified, not intractable, without status migrainosus: Secondary | ICD-10-CM | POA: Diagnosis present

## 2014-07-11 DIAGNOSIS — Z881 Allergy status to other antibiotic agents status: Secondary | ICD-10-CM | POA: Diagnosis not present

## 2014-07-11 DIAGNOSIS — I2510 Atherosclerotic heart disease of native coronary artery without angina pectoris: Secondary | ICD-10-CM | POA: Diagnosis present

## 2014-07-11 DIAGNOSIS — M4310 Spondylolisthesis, site unspecified: Secondary | ICD-10-CM | POA: Diagnosis present

## 2014-07-11 DIAGNOSIS — K219 Gastro-esophageal reflux disease without esophagitis: Secondary | ICD-10-CM | POA: Diagnosis present

## 2014-07-11 DIAGNOSIS — Z90710 Acquired absence of both cervix and uterus: Secondary | ICD-10-CM | POA: Diagnosis not present

## 2014-07-11 DIAGNOSIS — R109 Unspecified abdominal pain: Secondary | ICD-10-CM | POA: Diagnosis not present

## 2014-07-11 DIAGNOSIS — F329 Major depressive disorder, single episode, unspecified: Secondary | ICD-10-CM | POA: Diagnosis present

## 2014-07-11 DIAGNOSIS — Z9889 Other specified postprocedural states: Secondary | ICD-10-CM | POA: Diagnosis not present

## 2014-07-11 DIAGNOSIS — R0602 Shortness of breath: Secondary | ICD-10-CM | POA: Diagnosis not present

## 2014-07-11 HISTORY — DX: Ileus, unspecified: K56.7

## 2014-07-11 HISTORY — DX: Unspecified cirrhosis of liver: K74.60

## 2014-07-11 LAB — GLUCOSE, CAPILLARY
GLUCOSE-CAPILLARY: 104 mg/dL — AB (ref 70–99)
GLUCOSE-CAPILLARY: 91 mg/dL (ref 70–99)

## 2014-07-11 LAB — CBC
HEMATOCRIT: 39.7 % (ref 36.0–46.0)
Hemoglobin: 13.2 g/dL (ref 12.0–15.0)
MCH: 28.4 pg (ref 26.0–34.0)
MCHC: 33.2 g/dL (ref 30.0–36.0)
MCV: 85.4 fL (ref 78.0–100.0)
Platelets: 166 10*3/uL (ref 150–400)
RBC: 4.65 MIL/uL (ref 3.87–5.11)
RDW: 14.7 % (ref 11.5–15.5)
WBC: 13.2 10*3/uL — ABNORMAL HIGH (ref 4.0–10.5)

## 2014-07-11 LAB — COMPREHENSIVE METABOLIC PANEL
ALBUMIN: 3.1 g/dL — AB (ref 3.5–5.2)
ALK PHOS: 96 U/L (ref 39–117)
ALT: 12 U/L (ref 0–35)
AST: 25 U/L (ref 0–37)
Anion gap: 11 (ref 5–15)
BILIRUBIN TOTAL: 0.6 mg/dL (ref 0.3–1.2)
BUN: 7 mg/dL (ref 6–23)
CHLORIDE: 96 meq/L (ref 96–112)
CO2: 28 mEq/L (ref 19–32)
Calcium: 8.4 mg/dL (ref 8.4–10.5)
Creatinine, Ser: 0.63 mg/dL (ref 0.50–1.10)
GFR calc Af Amer: >90 mL/min (ref >90)
GFR calc non Af Amer: >90 mL/min (ref >90)
Glucose, Bld: 102 mg/dL — ABNORMAL HIGH (ref 70–99)
Potassium: 3.5 mEq/L — ABNORMAL LOW (ref 3.7–5.3)
SODIUM: 135 meq/L — AB (ref 137–147)
Total Protein: 7.3 g/dL (ref 6.0–8.3)

## 2014-07-11 LAB — DIFFERENTIAL
Basophils Absolute: 0 10*3/uL (ref 0.0–0.1)
Basophils Relative: 0 % (ref 0–1)
EOS PCT: 1 % (ref 0–5)
Eosinophils Absolute: 0.2 10*3/uL (ref 0.0–0.7)
LYMPHS ABS: 3.9 10*3/uL (ref 0.7–4.0)
LYMPHS PCT: 30 % (ref 12–46)
Monocytes Absolute: 0.9 10*3/uL (ref 0.1–1.0)
Monocytes Relative: 7 % (ref 3–12)
Neutro Abs: 8.3 10*3/uL — ABNORMAL HIGH (ref 1.7–7.7)
Neutrophils Relative %: 63 % (ref 43–77)

## 2014-07-11 LAB — HEMOGLOBIN A1C
HEMOGLOBIN A1C: 5.8 % — AB (ref <5.7)
Mean Plasma Glucose: 120 mg/dL — ABNORMAL HIGH (ref <117)

## 2014-07-11 LAB — HEPATITIS PANEL, ACUTE
HCV Ab: NEGATIVE
HEP A IGM: NONREACTIVE
Hep B C IgM: NONREACTIVE
Hepatitis B Surface Ag: NEGATIVE

## 2014-07-11 LAB — CBG MONITORING, ED: GLUCOSE-CAPILLARY: 122 mg/dL — AB (ref 70–99)

## 2014-07-11 LAB — MAGNESIUM: Magnesium: 1.7 mg/dL (ref 1.5–2.5)

## 2014-07-11 MED ORDER — HYDROMORPHONE HCL 1 MG/ML IJ SOLN
1.0000 mg | INTRAMUSCULAR | Status: DC | PRN
  Administered 2014-07-11 – 2014-07-12 (×6): 2 mg via INTRAVENOUS
  Filled 2014-07-11 (×7): qty 2

## 2014-07-11 MED ORDER — OLOPATADINE HCL 0.1 % OP SOLN
1.0000 [drp] | OPHTHALMIC | Status: DC | PRN
  Filled 2014-07-11: qty 5

## 2014-07-11 MED ORDER — ESTRADIOL 2 MG PO TABS
2.0000 mg | ORAL_TABLET | Freq: Every day | ORAL | Status: DC
  Administered 2014-07-11 – 2014-07-12 (×2): 2 mg via ORAL
  Filled 2014-07-11 (×2): qty 1

## 2014-07-11 MED ORDER — NYSTATIN 100000 UNIT/GM EX POWD
1.0000 g | Freq: Three times a day (TID) | CUTANEOUS | Status: DC | PRN
  Filled 2014-07-11: qty 15

## 2014-07-11 MED ORDER — LIDOCAINE 5 % EX OINT
1.0000 "application " | TOPICAL_OINTMENT | Freq: Every day | CUTANEOUS | Status: DC
  Filled 2014-07-11: qty 35.44

## 2014-07-11 MED ORDER — SODIUM CHLORIDE 0.9 % IV SOLN
INTRAVENOUS | Status: DC
  Administered 2014-07-11 (×2): via INTRAVENOUS
  Filled 2014-07-11 (×6): qty 1000

## 2014-07-11 MED ORDER — HYDROMORPHONE HCL 1 MG/ML IJ SOLN
0.5000 mg | Freq: Once | INTRAMUSCULAR | Status: AC
Start: 2014-07-11 — End: 2014-07-11
  Administered 2014-07-11: 0.5 mg via INTRAVENOUS
  Filled 2014-07-11: qty 1

## 2014-07-11 MED ORDER — KETOROLAC TROMETHAMINE 30 MG/ML IJ SOLN
30.0000 mg | Freq: Once | INTRAMUSCULAR | Status: AC
  Administered 2014-07-11: 30 mg via INTRAVENOUS
  Filled 2014-07-11: qty 1

## 2014-07-11 MED ORDER — BISACODYL 10 MG RE SUPP
10.0000 mg | Freq: Once | RECTAL | Status: AC
  Administered 2014-07-11: 10 mg via RECTAL
  Filled 2014-07-11: qty 1

## 2014-07-11 MED ORDER — LORAZEPAM 2 MG/ML IJ SOLN
1.0000 mg | Freq: Two times a day (BID) | INTRAMUSCULAR | Status: DC | PRN
  Administered 2014-07-11 – 2014-07-12 (×2): 1 mg via INTRAVENOUS
  Filled 2014-07-11 (×2): qty 1

## 2014-07-11 MED ORDER — POLYETHYLENE GLYCOL 3350 17 G PO PACK
17.0000 g | PACK | Freq: Once | ORAL | Status: DC
  Filled 2014-07-11: qty 1

## 2014-07-11 MED ORDER — ALBUTEROL SULFATE (2.5 MG/3ML) 0.083% IN NEBU: 2.5000 mg | INHALATION_SOLUTION | RESPIRATORY_TRACT | Status: DC | PRN

## 2014-07-11 MED ORDER — DEXTROSE 5 % IV SOLN
3.0000 g | Freq: Once | INTRAVENOUS | Status: AC
  Administered 2014-07-11: 3 g via INTRAVENOUS
  Filled 2014-07-11: qty 6

## 2014-07-11 MED ORDER — INSULIN ASPART 100 UNIT/ML ~~LOC~~ SOLN
0.0000 [IU] | Freq: Three times a day (TID) | SUBCUTANEOUS | Status: DC
  Administered 2014-07-12: 1 [IU] via SUBCUTANEOUS

## 2014-07-11 MED ORDER — FLUTICASONE PROPIONATE 50 MCG/ACT NA SUSP
2.0000 | Freq: Every evening | NASAL | Status: DC | PRN
  Filled 2014-07-11: qty 16

## 2014-07-11 MED ORDER — METOCLOPRAMIDE HCL 5 MG/ML IJ SOLN
10.0000 mg | Freq: Once | INTRAMUSCULAR | Status: AC
  Administered 2014-07-11: 10 mg via INTRAVENOUS
  Filled 2014-07-11: qty 2

## 2014-07-11 MED ORDER — PANTOPRAZOLE SODIUM 40 MG IV SOLR
40.0000 mg | INTRAVENOUS | Status: DC
  Administered 2014-07-11 – 2014-07-12 (×2): 40 mg via INTRAVENOUS
  Filled 2014-07-11 (×3): qty 40

## 2014-07-11 MED ORDER — MORPHINE SULFATE 2 MG/ML IJ SOLN: 2.0000 mg | INTRAMUSCULAR | Status: DC | PRN

## 2014-07-11 MED ORDER — ALBUTEROL SULFATE HFA 108 (90 BASE) MCG/ACT IN AERS
2.0000 | INHALATION_SPRAY | RESPIRATORY_TRACT | Status: DC | PRN
  Administered 2014-07-11: 2 via RESPIRATORY_TRACT
  Filled 2014-07-11: qty 6.7

## 2014-07-11 MED ORDER — POTASSIUM CHLORIDE 10 MEQ/100ML IV SOLN
10.0000 meq | INTRAVENOUS | Status: AC
  Administered 2014-07-11 (×3): 10 meq via INTRAVENOUS
  Filled 2014-07-11 (×3): qty 100

## 2014-07-11 MED ORDER — DIPHENHYDRAMINE HCL 50 MG/ML IJ SOLN
25.0000 mg | Freq: Once | INTRAMUSCULAR | Status: AC
  Administered 2014-07-11: 25 mg via INTRAVENOUS
  Filled 2014-07-11: qty 1
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
## 2014-07-11 NOTE — ED Notes (Signed)
RN notified of CBG 122

## 2014-07-11 NOTE — ED Notes (Signed)
Spoke with Dr. Grandville Silos. No orders received at this time. MD to come see pt in the ED.

## 2014-07-11 NOTE — ED Notes (Signed)
Pt assisted with getting on bed pan and off bedpan.

## 2014-07-11 NOTE — ED Notes (Signed)
Phlebotomy at the bedside  

## 2014-07-11 NOTE — ED Notes (Signed)
Dr. Grandville Silos paged at this time.
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
## 2014-07-11 NOTE — ED Notes (Signed)
[name], [name] with general surgery at the bedside.

## 2014-07-11 NOTE — Progress Notes (Signed)
[facility] PROGRESS NOTE  [name] CBU:[id number] DOB: 1960/12/12 DOA: 07/10/2014 PCP: Shirline Frees, MD  Assessment/Plan: #1 abdominal pain/recurrent ileus Patient presented with abdominal pain CT scan concerning for ileus patient does have a history of recurrent ileus question dysmotility. Patient with abdominal pain. Will keep nothing by mouth. Keep magnesium greater than 2. Keep potassium greater than 4. Serial abdominal films. Pain management. IV fluids. Continue empiric Zosyn. Will consult general surgery. Will also consult with GI as patient is having recurrent ileus and concern for dysmotility. Follow.  #2 hepatic cirrhosis Noted on CT of the abdomen and pelvis. Patient denies any alcohol abuse. LFTs are within normal limits. Will check an acute hepatitis panel. We will check an ANA, anti-smooth muscle antibody. Consult with GI for further evaluation and management.  #3 leukocytosis  Likely a reactive leukocytosis. CT of the abdomen and pelvis negative for infection. Urinalysis is negative. Urine cultures are pending. Blood cultures are pending. Chest x-ray is negative. Patient on empiric IV Zosyn.  #4 diabetes mellitus type 2 Check a hemoglobin A1c. Hold oral hypoglycemic agents. Check CBGs every 4 hours. Sliding scale insulin.  #5 coronary artery disease/hypertension Currently asymptomatic. Blood pressure stable. Follow.  #6 constipation Patient with moderate stool noted on CT scan. Will try dulcolax suppository.  #7 prophylaxis PPI for GI prophylaxis. Heparin for DVT prophylaxis.  Code Status: Full Family Communication: Updated patient no family present. Disposition Plan: Remain in patient.   Consultants:  None  Procedures:  CT abdomen and pelvis 07/10/2014  Antibiotics:  IV Zosyn 07/11/2014  HPI/Subjective: Patient still complains of abdominal pain stating pain medication is not helping with her abdominal pain. Patient denies any flatus. Patient  denies any bowel movements.  Objective: Filed Vitals:   07/11/14 1000  BP: 131/74  Pulse: 82  Temp:   Resp: 16    Intake/Output Summary (Last 24 hours) at 07/11/14 1027 Last data filed at 07/11/14 0834  Gross per 24 hour  Intake      0 ml  Output    100 ml  Net   -100 ml   Filed Weights   07/10/14 1342  Weight: 102.513 kg (226 lb)    Exam:   General:  NAD  Cardiovascular: RRR  Respiratory: CTAB  Abdomen: Soft, mild distended, diffuse tenderness to palpation, hypoactive bowel sounds.  Musculoskeletal: No clubbing cyanosis or edema.  Data Reviewed: Basic Metabolic Panel:  Recent Labs Lab 07/10/14 1349 07/10/14 2336 07/11/14 0837  NA 136* 135*  --   K 4.2 3.5*  --   CL 94* 96  --   CO2 27 28  --   GLUCOSE 140* 102*  --   BUN 7 7  --   CREATININE 0.62 0.63  --   CALCIUM 9.1 8.4  --   MG  --   --  1.7   Liver Function Tests:  Recent Labs Lab 07/10/14 1722 07/10/14 2336  AST 29 25  ALT 13 12  ALKPHOS 93 96  BILITOT 0.4 0.6  PROT 7.5 7.3  ALBUMIN 3.0* 3.1*    Recent Labs Lab 07/10/14 1722  LIPASE 28   No results found for this basename: AMMONIA,  in the last 168 hours CBC:  Recent Labs Lab 07/10/14 1349 07/10/14 1722 07/10/14 2336  WBC 14.8*  --  13.2*  NEUTROABS  --  9.4* 8.3*  HGB 15.4*  --  13.2  HCT 46.1*  --  39.7  MCV 86.2  --  85.4  PLT 220  --  166   Cardiac Enzymes: No results found for this basename: CKTOTAL, CKMB, CKMBINDEX, TROPONINI,  in the last 168 hours BNP (last 3 results)  Recent Labs  07/10/14 1349  PROBNP 39.1   CBG: No results found for this basename: GLUCAP,  in the last 168 hours  No results found for this or any previous visit (from the past 240 hour(s)).   Studies: Dg Chest 2 View  07/10/2014   CLINICAL DATA:  Bilateral chest pain for 1 day. Epigastric pain for 6 days. Vomiting.  EXAM: CHEST  2 VIEW  COMPARISON:  03/18/2013  FINDINGS: The cardiac silhouette, mediastinal and hilar contours are  within normal limits and stable. There are chronic bronchitic type interstitial lung changes with peribronchial thickening, increased interstitial markings and pulmonary scarring. No pleural effusion or focal airspace consolidation. The bony thorax is intact.  IMPRESSION: Chronic lung changes but no acute pulmonary findings.   Electronically Signed   By: Kalman Jewels M.D.   On: 07/10/2014 15:29   Ct Abdomen Pelvis W Contrast  07/10/2014   CLINICAL DATA:  Generalized abdominal pain.  EXAM: CT ABDOMEN AND PELVIS WITH CONTRAST  TECHNIQUE: Multidetector CT imaging of the abdomen and pelvis was performed using the standard protocol following bolus administration of intravenous contrast.  CONTRAST:  100 mL of Omnipaque 300 intravenously.  COMPARISON:  CT scan of December 24, 2012.  FINDINGS: Bilateral pars defects are again noted at L5. No significant abnormality is noted in visualized lung bases.  Gallbladder is distended without gallstones. Nodular hepatic margins are noted concerning for hepatic cirrhosis. No definite mass lesion is noted within the hepatic parenchyma. The spleen and pancreas appear normal. Adrenal glands and kidneys appear normal. No hydronephrosis or renal obstruction is noted. Mild ascites is noted around the liver, spleen as well as extending down both pericolic gutters into the pelvis. Mildly dilated small bowel loops are noted proximally without definite transition zone. Urinary bladder appears normal. Status post hysterectomy. Mildly enlarged lymph nodes are noted in the porta hepatis region most likely related to hepatic cirrhosis. Stool is noted throughout the colon.  IMPRESSION: Gallbladder distention is noted without cholelithiasis.  Hepatic cirrhosis is noted with mild ascites seen.  Mildly dilated proximal small bowel loops are noted without definite transition zone. It is uncertain if this represents focal ileus, or possibly partial small bowel obstruction. Followup radiographs are  recommended.   Electronically Signed   By: Sabino Dick M.D.   On: 07/10/2014 20:20    Scheduled Meds: . estradiol  2 mg Oral Daily  . heparin  5,000 Units Subcutaneous 3 times per day  . insulin aspart  0-9 Units Subcutaneous TID WC  . lidocaine  1 application Topical Daily  . ondansetron (ZOFRAN) IV  4 mg Intravenous 4 times per day   Continuous Infusions: . sodium chloride 100 mL/hr at 07/11/14 0013  . magnesium sulfate 1 - 4 g bolus IVPB    . piperacillin-tazobactam (ZOSYN)  IV 3.375 g (07/11/14 0535)    Principal Problem:   Abdominal pain, generalized Active Problems:   Ileus   Chronic pain   Tobacco abuse   CAD (coronary artery disease)   Hypertension   COPD (chronic obstructive pulmonary disease)   Morbid obesity   Diabetes   Cholecystitis   Cirrhosis of liver: per CT   Leukocytosis    Time spent: 68 mins    [facility] MD [facility] Pager [phone]. If 7PM-7AM, please contact night-coverage at [email or web address], password [facility] 07/11/2014, 10:27  AM  LOS: 1 day

## 2014-07-11 NOTE — ED Notes (Signed)
Family at bedside. 

## 2014-07-11 NOTE — ED Notes (Addendum)
Paged Dr. Ernestina Patches to notify him that the pt was experiencing break through pain and to request and order for more pain medication .

## 2014-07-12 ENCOUNTER — Inpatient Hospital Stay (HOSPITAL_COMMUNITY): Payer: Medicare Other

## 2014-07-12 LAB — URINE CULTURE: Colony Count: 80000

## 2014-07-12 LAB — COMPREHENSIVE METABOLIC PANEL
ALBUMIN: 3.2 g/dL — AB (ref 3.5–5.2)
ALT: 12 U/L (ref 0–35)
AST: 26 U/L (ref 0–37)
Alkaline Phosphatase: 89 U/L (ref 39–117)
Anion gap: 11 (ref 5–15)
BUN: 6 mg/dL (ref 6–23)
CALCIUM: 7.7 mg/dL — AB (ref 8.4–10.5)
CO2: 26 mEq/L (ref 19–32)
CREATININE: 0.75 mg/dL (ref 0.50–1.10)
Chloride: 103 mEq/L (ref 96–112)
GFR calc Af Amer: >90 mL/min (ref >90)
GFR calc non Af Amer: >90 mL/min (ref >90)
Glucose, Bld: 101 mg/dL — ABNORMAL HIGH (ref 70–99)
Potassium: 4.6 mEq/L (ref 3.7–5.3)
Sodium: 140 mEq/L (ref 137–147)
Total Bilirubin: 0.5 mg/dL (ref 0.3–1.2)
Total Protein: 7.9 g/dL (ref 6.0–8.3)

## 2014-07-12 LAB — CBC WITH DIFFERENTIAL/PLATELET
Basophils Absolute: 0 10*3/uL (ref 0.0–0.1)
Basophils Relative: 0 % (ref 0–1)
Eosinophils Absolute: 0.3 10*3/uL (ref 0.0–0.7)
Eosinophils Relative: 3 % (ref 0–5)
HEMATOCRIT: 41.3 % (ref 36.0–46.0)
Hemoglobin: 12.9 g/dL (ref 12.0–15.0)
LYMPHS ABS: 3.3 10*3/uL (ref 0.7–4.0)
LYMPHS PCT: 37 % (ref 12–46)
MCH: 28.4 pg (ref 26.0–34.0)
MCHC: 31.2 g/dL (ref 30.0–36.0)
MCV: 90.8 fL (ref 78.0–100.0)
MONO ABS: 0.5 10*3/uL (ref 0.1–1.0)
Monocytes Relative: 5 % (ref 3–12)
NEUTROS ABS: 5.1 10*3/uL (ref 1.7–7.7)
Neutrophils Relative %: 55 % (ref 43–77)
Platelets: 167 10*3/uL (ref 150–400)
RBC: 4.55 MIL/uL (ref 3.87–5.11)
RDW: 15.3 % (ref 11.5–15.5)
WBC: 9.2 10*3/uL (ref 4.0–10.5)

## 2014-07-12 LAB — GLUCOSE, CAPILLARY
GLUCOSE-CAPILLARY: 101 mg/dL — AB (ref 70–99)
GLUCOSE-CAPILLARY: 149 mg/dL — AB (ref 70–99)
Glucose-Capillary: 122 mg/dL — ABNORMAL HIGH (ref 70–99)
Glucose-Capillary: 98 mg/dL (ref 70–99)

## 2014-07-12 LAB — ANA: Anti Nuclear Antibody(ANA): NEGATIVE

## 2014-07-12 MED ORDER — POLYETHYLENE GLYCOL 3350 17 G PO PACK: 17.0000 g | PACK | Freq: Two times a day (BID) | ORAL | Status: DC

## 2014-07-12 MED ORDER — SORBITOL 70 % SOLN
960.0000 mL | TOPICAL_OIL | Freq: Once | ORAL | Status: AC
  Administered 2014-07-12: 960 mL via RECTAL
  Filled 2014-07-12: qty 240

## 2014-07-12 MED ORDER — DOCUSATE SODIUM 100 MG PO CAPS: 100.0000 mg | ORAL_CAPSULE | Freq: Two times a day (BID) | ORAL | Status: DC

## 2014-07-12 MED ORDER — OXYCODONE-ACETAMINOPHEN 5-325 MG PO TABS
1.0000 | ORAL_TABLET | Freq: Four times a day (QID) | ORAL | Status: DC | PRN
  Administered 2014-07-12: 1 via ORAL
  Filled 2014-07-12: qty 1

## 2014-07-12 MED ORDER — ACETAMINOPHEN 325 MG PO TABS
650.0000 mg | ORAL_TABLET | Freq: Four times a day (QID) | ORAL | Status: DC | PRN
  Administered 2014-07-12: 650 mg via ORAL
  Filled 2014-07-12: qty 2

## 2014-07-12 MED ORDER — LORAZEPAM 2 MG/ML IJ SOLN: 0.5000 mg | Freq: Two times a day (BID) | INTRAMUSCULAR | Status: DC | PRN

## 2014-07-12 MED ORDER — METHYLNALTREXONE BROMIDE 12 MG/0.6ML ~~LOC~~ SOLN
12.0000 mg | Freq: Once | SUBCUTANEOUS | Status: DC
  Filled 2014-07-12: qty 0.6

## 2014-07-12 MED ORDER — ONDANSETRON HCL 4 MG PO TABS: 4.0000 mg | ORAL_TABLET | Freq: Three times a day (TID) | ORAL | Status: DC | PRN

## 2014-07-12 NOTE — Progress Notes (Signed)
Patient ID: [name], female   DOB: 02/14/1961, 53 y.o.   MRN: [id number]    Subjective: Pt feels better today, but has a bad migraine.  Wants to eat.  Passing flatus  Objective: Vital signs in last 24 hours: Temp:  [97.9 F (36.6 C)-99.2 F (37.3 C)] 98.8 F (37.1 C) (10/28 0500) Pulse Rate:  [82-106] 99 (10/28 0500) Resp:  [12-20] 20 (10/28 0500) BP: (107-131)/(54-77) 130/54 mmHg (10/28 0500) SpO2:  [95 %-99 %] 95 % (10/28 0500) Weight:  [235 lb 0.2 oz (106.6 kg)] 235 lb 0.2 oz (106.6 kg) (10/27 1335) Last BM Date: 07/11/14  Intake/Output from previous day: 10/27 0701 - 10/28 0700 In: 321.7 [I.V.:321.7] Out: 100 [Urine:100] Intake/Output this shift:    PE: Abd: soft, minimally tender, obese, +BS Heart: regular Lungs: CTAB  Lab Results:   Recent Labs  07/10/14 2336 07/12/14 0523  WBC 13.2* 9.2  HGB 13.2 12.9  HCT 39.7 41.3  PLT 166 167   BMET  Recent Labs  07/10/14 2336 07/12/14 0523  NA 135* 140  K 3.5* 4.6  CL 96 103  CO2 28 26  GLUCOSE 102* 101*  BUN 7 6  CREATININE 0.63 0.75  CALCIUM 8.4 7.7*   PT/INR No results found for this basename: LABPROT, INR,  in the last 72 hours CMP     Component Value Date/Time   NA 140 07/12/2014 0523   K 4.6 07/12/2014 0523   CL 103 07/12/2014 0523   CO2 26 07/12/2014 0523   GLUCOSE 101* 07/12/2014 0523   BUN 6 07/12/2014 0523   CREATININE 0.75 07/12/2014 0523   CALCIUM 7.7* 07/12/2014 0523   PROT 7.9 07/12/2014 0523   ALBUMIN 3.2* 07/12/2014 0523   AST 26 07/12/2014 0523   ALT 12 07/12/2014 0523   ALKPHOS 89 07/12/2014 0523   BILITOT 0.5 07/12/2014 0523   GFRNONAA >90 07/12/2014 0523   GFRAA >90 07/12/2014 0523   Lipase     Component Value Date/Time   LIPASE 28 07/10/2014 1722       Studies/Results: Dg Chest 2 View  07/10/2014   CLINICAL DATA:  Bilateral chest pain for 1 day. Epigastric pain for 6 days. Vomiting.  EXAM: CHEST  2 VIEW  COMPARISON:  03/18/2013  FINDINGS: The cardiac  silhouette, mediastinal and hilar contours are within normal limits and stable. There are chronic bronchitic type interstitial lung changes with peribronchial thickening, increased interstitial markings and pulmonary scarring. No pleural effusion or focal airspace consolidation. The bony thorax is intact.  IMPRESSION: Chronic lung changes but no acute pulmonary findings.   Electronically Signed   By: Kalman Jewels M.D.   On: 07/10/2014 15:29   Ct Abdomen Pelvis W Contrast  07/10/2014   CLINICAL DATA:  Generalized abdominal pain.  EXAM: CT ABDOMEN AND PELVIS WITH CONTRAST  TECHNIQUE: Multidetector CT imaging of the abdomen and pelvis was performed using the standard protocol following bolus administration of intravenous contrast.  CONTRAST:  100 mL of Omnipaque 300 intravenously.  COMPARISON:  CT scan of December 24, 2012.  FINDINGS: Bilateral pars defects are again noted at L5. No significant abnormality is noted in visualized lung bases.  Gallbladder is distended without gallstones. Nodular hepatic margins are noted concerning for hepatic cirrhosis. No definite mass lesion is noted within the hepatic parenchyma. The spleen and pancreas appear normal. Adrenal glands and kidneys appear normal. No hydronephrosis or renal obstruction is noted. Mild ascites is noted around the liver, spleen as well as extending down  both pericolic gutters into the pelvis. Mildly dilated small bowel loops are noted proximally without definite transition zone. Urinary bladder appears normal. Status post hysterectomy. Mildly enlarged lymph nodes are noted in the porta hepatis region most likely related to hepatic cirrhosis. Stool is noted throughout the colon.  IMPRESSION: Gallbladder distention is noted without cholelithiasis.  Hepatic cirrhosis is noted with mild ascites seen.  Mildly dilated proximal small bowel loops are noted without definite transition zone. It is uncertain if this represents focal ileus, or possibly partial small  bowel obstruction. Followup radiographs are recommended.   Electronically Signed   By: Sabino Dick M.D.   On: 07/10/2014 20:20    Anti-infectives: Anti-infectives   Start     Dose/Rate Route Frequency Ordered Stop   07/11/14 0600  piperacillin-tazobactam (ZOSYN) IVPB 3.375 g     3.375 g 12.5 mL/hr over 240 Minutes Intravenous Every 8 hours 07/10/14 2333     07/10/14 2345  piperacillin-tazobactam (ZOSYN) IVPB 3.375 g     3.375 g 100 mL/hr over 30 Minutes Intravenous  Once 07/10/14 2333 07/11/14 0043       Assessment/Plan  1.  1. Acute on chronic abdominal pain  2. Hepatic cirrhosis  3. Nausea/vomiting, improved currently  4. Constipation secondary to chronic narcotic use 5. Multiple medical problems  Plan: 1.  Patient is passing flatus and has active bowel sounds.  She does not have evidence of an obstruction or ileus.  2. She may have clear liquids from our standpoint.  She needs a aggressive bowel regimen while taking chronic narcotic pain medications. 3. We will defer further treatment of cirrhosis and chronic abdominal symptoms to GI.  We will sign off as there is no surgical indications.   LOS: 2 days    Waylin Dorko E 07/12/2014, 9:34 AM Pager: [phone]

## 2014-07-12 NOTE — Discharge Instructions (Signed)
Follow with Primary MD Shirline Frees, MD in 7 days   Get CBC, CMP, 2 view Chest X ray checked  by Primary MD next visit.    Activity: As tolerated with Full fall precautions use walker/cane & assistance as needed   Disposition Home     Diet: Heart Healthy Low carb.  For Heart failure patients - Check your Weight same time everyday, if you gain over 2 pounds, or you develop in leg swelling, experience more shortness of breath or chest pain, call your Primary MD immediately. Follow Cardiac Low Salt Diet and 1.8 lit/day fluid restriction.   On your next visit with your primary care physician please Get Medicines reviewed and adjusted.   Please request your Prim.MD to go over all Hospital Tests and Procedure/Radiological results at the follow up, please get all Hospital records sent to your Prim MD by signing hospital release before you go home.   If you experience worsening of your admission symptoms, develop shortness of breath, life threatening emergency, suicidal or homicidal thoughts you must seek medical attention immediately by calling 911 or calling your MD immediately  if symptoms less severe.  You Must read complete instructions/literature along with all the possible adverse reactions/side effects for all the Medicines you take and that have been prescribed to you. Take any new Medicines after you have completely understood and accpet all the possible adverse reactions/side effects.   Do not drive, operating heavy machinery, perform activities at heights, swimming or participation in water activities or provide baby sitting services if your were admitted for syncope or siezures until you have seen by Primary MD or a Neurologist and advised to do so again.  Do not drive when taking Pain medications.    Do not take more than prescribed Pain, Sleep and Anxiety Medications  Special Instructions: If you have smoked or chewed Tobacco  in the last 2 yrs please stop smoking, stop any  regular Alcohol  and or any Recreational drug use.  Wear Seat belts while driving.   Please note  You were cared for by a hospitalist during your hospital stay. If you have any questions about your discharge medications or the care you received while you were in the hospital after you are discharged, you can call the unit and asked to speak with the hospitalist on call if the hospitalist that took care of you is not available. Once you are discharged, your primary care physician will handle any further medical issues. Please note that NO REFILLS for any discharge medications will be authorized once you are discharged, as it is imperative that you return to your primary care physician (or establish a relationship with a primary care physician if you do not have one) for your aftercare needs so that they can reassess your need for medications and monitor your lab values.

## 2014-07-12 NOTE — Care Management Note (Signed)
  Page 1 of 1   07/12/2014     9:37:18 AM CARE MANAGEMENT NOTE 07/12/2014  Patient:  [name], [name]   Account Number:  [id number]  Date Initiated:  07/12/2014  Documentation initiated by:  Magdalen Spatz  Subjective/Objective Assessment:     Action/Plan:   Anticipated DC Date:     Anticipated DC Plan:           Choice offered to / List presented to:             Status of service:   Medicare Important Message given?  NA - LOS <3 / Initial given by admissions (If response is "NO", the following Medicare IM given date fields will be blank) Date Medicare IM given:   Medicare IM given by:   Date Additional Medicare IM given:   Additional Medicare IM given by:    Discharge Disposition:    Per UR Regulation:    If discussed at Long Length of Stay Meetings, dates discussed:    Comments:

## 2014-07-12 NOTE — Consult Note (Signed)
Not very sick.  Will follow for symptoms.  Patient unaware of her cirrhotic changes of her liver.  [name]. Dahlia Bailiff, MD, [place] [phone] [facility]
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
## 2014-07-12 NOTE — Progress Notes (Signed)
Pt's ride is here and pt is ready to go.  MD wanted to pt to receive a medication to help with constipation, however, it is coming from pharmacy and will take a little bit.  MD paged regarding medicine and stated pt could leave without taking the med since pt has had 2 BM's from earlier enema.   Syliva Overman

## 2014-07-12 NOTE — Progress Notes (Signed)
Discharge instructions and prescription given and explained to pt.  Pt verbalized understanding of all orders/instructions.  IV removed.  Pt given information and informed about low residue diet.  VSS. Pt has no questions at this time.   Syliva Overman

## 2014-07-12 NOTE — Progress Notes (Signed)
With improvement agree with clear liquids.  [name]. Dahlia Bailiff, MD, [place] [phone] [facility]

## 2014-07-12 NOTE — Progress Notes (Signed)
[facility] PROGRESS NOTE Subjective Pt still on ice chips but having BMs and passing flatus  Objective: Vital signs in last 24 hours: Temp:  [97.9 F (36.6 C)-99.2 F (37.3 C)] 98.8 F (37.1 C) (10/28 0500) Pulse Rate:  [82-106] 99 (10/28 0500) Resp:  [12-20] 20 (10/28 0500) BP: (107-131)/(54-77) 130/54 mmHg (10/28 0500) SpO2:  [95 %-99 %] 95 % (10/28 0500) Weight:  [106.6 kg (235 lb 0.2 oz)] 106.6 kg (235 lb 0.2 oz) (10/27 1335) Last BM Date: 07/11/14  Intake/Output from previous day: 10/27 0701 - 10/28 0700 In: 321.7 [I.V.:321.7] Out: 100 [Urine:100] Intake/Output this shift:    PE: General--walking around NAD  Abdomen--distended good BSs, minimal tenderness  Lab Results:  Recent Labs  07/10/14 1349 07/10/14 2336 07/12/14 0523  WBC 14.8* 13.2* 9.2  HGB 15.4* 13.2 12.9  HCT 46.1* 39.7 41.3  PLT 220 166 167   BMET  Recent Labs  07/10/14 1349 07/10/14 2336 07/12/14 0523  NA 136* 135* 140  K 4.2 3.5* 4.6  CL 94* 96 103  CO2 27 28 26   CREATININE 0.62 0.63 0.75   LFT  Recent Labs  07/10/14 1722 07/10/14 2336 07/12/14 0523  PROT 7.5 7.3 7.9  AST 29 25 26   ALT 13 12 12   ALKPHOS 93 96 89  BILITOT 0.4 0.6 0.5  BILIDIR <0.2  --   --   IBILI NOT CALCULATED  --   --    PT/INR No results found for this basename: LABPROT, INR,  in the last 72 hours PANCREAS  Recent Labs  07/10/14 1722  LIPASE 28         Studies/Results: Dg Chest 2 View  07/10/2014   CLINICAL DATA:  Bilateral chest pain for 1 day. Epigastric pain for 6 days. Vomiting.  EXAM: CHEST  2 VIEW  COMPARISON:  03/18/2013  FINDINGS: The cardiac silhouette, mediastinal and hilar contours are within normal limits and stable. There are chronic bronchitic type interstitial lung changes with peribronchial thickening, increased interstitial markings and pulmonary scarring. No pleural effusion or focal airspace consolidation. The bony thorax is intact.  IMPRESSION: Chronic lung  changes but no acute pulmonary findings.   Electronically Signed   By: Kalman Jewels M.D.   On: 07/10/2014 15:29   Ct Abdomen Pelvis W Contrast  07/10/2014   CLINICAL DATA:  Generalized abdominal pain.  EXAM: CT ABDOMEN AND PELVIS WITH CONTRAST  TECHNIQUE: Multidetector CT imaging of the abdomen and pelvis was performed using the standard protocol following bolus administration of intravenous contrast.  CONTRAST:  100 mL of Omnipaque 300 intravenously.  COMPARISON:  CT scan of December 24, 2012.  FINDINGS: Bilateral pars defects are again noted at L5. No significant abnormality is noted in visualized lung bases.  Gallbladder is distended without gallstones. Nodular hepatic margins are noted concerning for hepatic cirrhosis. No definite mass lesion is noted within the hepatic parenchyma. The spleen and pancreas appear normal. Adrenal glands and kidneys appear normal. No hydronephrosis or renal obstruction is noted. Mild ascites is noted around the liver, spleen as well as extending down both pericolic gutters into the pelvis. Mildly dilated small bowel loops are noted proximally without definite transition zone. Urinary bladder appears normal. Status post hysterectomy. Mildly enlarged lymph nodes are noted in the porta hepatis region most likely related to hepatic cirrhosis. Stool is noted throughout the colon.  IMPRESSION: Gallbladder distention is noted without cholelithiasis.  Hepatic cirrhosis is noted with mild ascites seen.  Mildly dilated proximal small bowel loops  are noted without definite transition zone. It is uncertain if this represents focal ileus, or possibly partial small bowel obstruction. Followup radiographs are recommended.   Electronically Signed   By: Sabino Dick M.D.   On: 07/10/2014 20:20    Medications: I have reviewed the patient's current medications.  Assessment/Plan: 1. Acute/chronic Abdominal Pain. Probably due to adhesions, constipation due to narcotics 2. Cirrhosis. LFTs  good probably due to NASH, labs pending.  Plan:  Will advance to [facility] diet and instruct in LR diet. If tolerated, could go home and f/u with me in 2-3 weeks. Will need to be sent home with miralax 2-3 times daily since so prone to constipation   Henri Baumler JR,Trinitee Horgan L 07/12/2014, 9:57 AM

## 2014-07-13 LAB — ANTI-SMOOTH MUSCLE ANTIBODY, IGG: F-ACTIN AB IGG: 8 U (ref <20)

## 2014-07-17 DIAGNOSIS — J40 Bronchitis, not specified as acute or chronic: Secondary | ICD-10-CM | POA: Diagnosis not present

## 2014-07-17 LAB — CULTURE, BLOOD (ROUTINE X 2)
CULTURE: NO GROWTH
Culture: NO GROWTH

## 2014-07-18 DIAGNOSIS — R1084 Generalized abdominal pain: Secondary | ICD-10-CM | POA: Diagnosis not present

## 2014-07-19 DIAGNOSIS — F419 Anxiety disorder, unspecified: Secondary | ICD-10-CM | POA: Diagnosis not present

## 2014-07-19 DIAGNOSIS — G43909 Migraine, unspecified, not intractable, without status migrainosus: Secondary | ICD-10-CM | POA: Diagnosis not present

## 2014-07-19 DIAGNOSIS — E119 Type 2 diabetes mellitus without complications: Secondary | ICD-10-CM | POA: Diagnosis not present

## 2014-07-19 DIAGNOSIS — K219 Gastro-esophageal reflux disease without esophagitis: Secondary | ICD-10-CM | POA: Diagnosis not present

## 2014-07-19 DIAGNOSIS — M5127 Other intervertebral disc displacement, lumbosacral region: Secondary | ICD-10-CM | POA: Diagnosis not present

## 2014-07-19 DIAGNOSIS — J40 Bronchitis, not specified as acute or chronic: Secondary | ICD-10-CM | POA: Diagnosis not present

## 2014-07-19 DIAGNOSIS — I10 Essential (primary) hypertension: Secondary | ICD-10-CM | POA: Diagnosis not present

## 2014-07-19 DIAGNOSIS — K5669 Other intestinal obstruction: Secondary | ICD-10-CM | POA: Diagnosis not present

## 2014-08-03 DIAGNOSIS — M25562 Pain in left knee: Secondary | ICD-10-CM | POA: Diagnosis not present

## 2014-08-03 DIAGNOSIS — Z471 Aftercare following joint replacement surgery: Secondary | ICD-10-CM | POA: Diagnosis not present

## 2014-08-03 DIAGNOSIS — Z96652 Presence of left artificial knee joint: Secondary | ICD-10-CM | POA: Diagnosis not present

## 2014-08-22 ENCOUNTER — Encounter (HOSPITAL_COMMUNITY): Payer: Self-pay | Admitting: *Deleted

## 2014-08-22 ENCOUNTER — Emergency Department (HOSPITAL_COMMUNITY)
Admission: EM | Admit: 2014-08-22 | Discharge: 2014-08-22 | Disposition: A | Discharge status: Home / Self Care | Payer: Medicare Other | Attending: Emergency Medicine | Admitting: Emergency Medicine

## 2014-08-22 DIAGNOSIS — E119 Type 2 diabetes mellitus without complications: Secondary | ICD-10-CM | POA: Insufficient documentation

## 2014-08-22 DIAGNOSIS — K219 Gastro-esophageal reflux disease without esophagitis: Secondary | ICD-10-CM | POA: Diagnosis not present

## 2014-08-22 DIAGNOSIS — G43809 Other migraine, not intractable, without status migrainosus: Secondary | ICD-10-CM | POA: Diagnosis not present

## 2014-08-22 DIAGNOSIS — F329 Major depressive disorder, single episode, unspecified: Secondary | ICD-10-CM | POA: Diagnosis not present

## 2014-08-22 DIAGNOSIS — Z7951 Long term (current) use of inhaled steroids: Secondary | ICD-10-CM | POA: Insufficient documentation

## 2014-08-22 DIAGNOSIS — I2510 Atherosclerotic heart disease of native coronary artery without angina pectoris: Secondary | ICD-10-CM | POA: Insufficient documentation

## 2014-08-22 DIAGNOSIS — Z720 Tobacco use: Secondary | ICD-10-CM | POA: Insufficient documentation

## 2014-08-22 DIAGNOSIS — E669 Obesity, unspecified: Secondary | ICD-10-CM | POA: Diagnosis not present

## 2014-08-22 DIAGNOSIS — J449 Chronic obstructive pulmonary disease, unspecified: Secondary | ICD-10-CM | POA: Insufficient documentation

## 2014-08-22 DIAGNOSIS — E780 Pure hypercholesterolemia: Secondary | ICD-10-CM | POA: Diagnosis not present

## 2014-08-22 DIAGNOSIS — Z79891 Long term (current) use of opiate analgesic: Secondary | ICD-10-CM | POA: Insufficient documentation

## 2014-08-22 DIAGNOSIS — I10 Essential (primary) hypertension: Secondary | ICD-10-CM | POA: Diagnosis not present

## 2014-08-22 DIAGNOSIS — M1990 Unspecified osteoarthritis, unspecified site: Secondary | ICD-10-CM | POA: Insufficient documentation

## 2014-08-22 DIAGNOSIS — Z880 Allergy status to penicillin: Secondary | ICD-10-CM | POA: Insufficient documentation

## 2014-08-22 DIAGNOSIS — Z79899 Other long term (current) drug therapy: Secondary | ICD-10-CM | POA: Insufficient documentation

## 2014-08-22 DIAGNOSIS — G43909 Migraine, unspecified, not intractable, without status migrainosus: Secondary | ICD-10-CM | POA: Diagnosis present

## 2014-08-22 DIAGNOSIS — G8929 Other chronic pain: Secondary | ICD-10-CM | POA: Diagnosis not present

## 2014-08-22 DIAGNOSIS — Z8776 Personal history of (corrected) congenital malformations of integument, limbs and musculoskeletal system: Secondary | ICD-10-CM | POA: Diagnosis not present

## 2014-08-22 DIAGNOSIS — Z86711 Personal history of pulmonary embolism: Secondary | ICD-10-CM | POA: Diagnosis not present

## 2014-08-22 MED ORDER — HYDROMORPHONE HCL 1 MG/ML IJ SOLN
2.0000 mg | Freq: Once | INTRAMUSCULAR | Status: AC
  Administered 2014-08-22: 2 mg via INTRAVENOUS
  Filled 2014-08-22: qty 2

## 2014-08-22 MED ORDER — SODIUM CHLORIDE 0.9 % IV BOLUS (SEPSIS)
1000.0000 mL | Freq: Once | INTRAVENOUS | Status: AC
  Administered 2014-08-22: 1000 mL via INTRAVENOUS

## 2014-08-22 NOTE — ED Notes (Signed)
Pt states that she has had a migraine since noon yesterday. Hx of same and her prescribed medications are not working. A&O x4
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
## 2014-08-22 NOTE — Discharge Instructions (Signed)
Continue your medications as before.   Return to the ED if your symptoms significantly worsen or change.   Migraine Headache A migraine headache is an intense, throbbing pain on one or both sides of your head. A migraine can last for 30 minutes to several hours. CAUSES  The exact cause of a migraine headache is not always known. However, a migraine may be caused when nerves in the brain become irritated and release chemicals that cause inflammation. This causes pain. Certain things may also trigger migraines, such as:  Alcohol.  Smoking.  Stress.  Menstruation.  Aged cheeses.  Foods or drinks that contain nitrates, glutamate, aspartame, or tyramine.  Lack of sleep.  Chocolate.  Caffeine.  Hunger.  Physical exertion.  Fatigue.  Medicines used to treat chest pain (nitroglycerine), birth control pills, estrogen, and some blood pressure medicines. SIGNS AND SYMPTOMS  Pain on one or both sides of your head.  Pulsating or throbbing pain.  Severe pain that prevents daily activities.  Pain that is aggravated by any physical activity.  Nausea, vomiting, or both.  Dizziness.  Pain with exposure to bright lights, loud noises, or activity.  General sensitivity to bright lights, loud noises, or smells. Before you get a migraine, you may get warning signs that a migraine is coming (aura). An aura may include:  Seeing flashing lights.  Seeing bright spots, halos, or zigzag lines.  Having tunnel vision or blurred vision.  Having feelings of numbness or tingling.  Having trouble talking.  Having muscle weakness. DIAGNOSIS  A migraine headache is often diagnosed based on:  Symptoms.  Physical exam.  A CT scan or MRI of your head. These imaging tests cannot diagnose migraines, but they can help rule out other causes of headaches. TREATMENT Medicines may be given for pain and nausea. Medicines can also be given to help prevent recurrent migraines.  HOME CARE  INSTRUCTIONS  Only take over-the-counter or prescription medicines for pain or discomfort as directed by your health care provider. The use of long-term narcotics is not recommended.  Lie down in a dark, quiet room when you have a migraine.  Keep a journal to find out what may trigger your migraine headaches. For example, write down:  What you eat and drink.  How much sleep you get.  Any change to your diet or medicines.  Limit alcohol consumption.  Quit smoking if you smoke.  Get 7-9 hours of sleep, or as recommended by your health care provider.  Limit stress.  Keep lights dim if bright lights bother you and make your migraines worse. SEEK IMMEDIATE MEDICAL CARE IF:   Your migraine becomes severe.  You have a fever.  You have a stiff neck.  You have vision loss.  You have muscular weakness or loss of muscle control.  You start losing your balance or have trouble walking.  You feel faint or pass out.  You have severe symptoms that are different from your first symptoms. MAKE SURE YOU:   Understand these instructions.  Will watch your condition.  Will get help right away if you are not doing well or get worse. Document Released: 09/01/2005 Document Revised: 01/16/2014 Document Reviewed: 05/09/2013 [facility] Patient Information 2015 [place], [place]. This information is not intended to replace advice given to you by your health care provider. Make sure you discuss any questions you have with your health care provider.

## 2014-08-22 NOTE — ED Notes (Signed)
Pt undressed, in gown, on continuous pulse oximetry, blood pressure cuff and oxygen [place] (3L); pt stated she wears oxygen at home when needed [place] (3L)

## 2014-08-23 ENCOUNTER — Emergency Department (HOSPITAL_COMMUNITY): Payer: Medicare Other

## 2014-08-23 ENCOUNTER — Telehealth: Payer: Self-pay | Admitting: *Deleted

## 2014-08-23 ENCOUNTER — Emergency Department (HOSPITAL_COMMUNITY)
Admission: EM | Admit: 2014-08-23 | Discharge: 2014-08-23 | Disposition: A | Discharge status: Home / Self Care | Payer: Medicare Other | Attending: Emergency Medicine | Admitting: Emergency Medicine

## 2014-08-23 ENCOUNTER — Encounter (HOSPITAL_COMMUNITY): Payer: Self-pay

## 2014-08-23 DIAGNOSIS — R51 Headache: Secondary | ICD-10-CM | POA: Diagnosis not present

## 2014-08-23 DIAGNOSIS — Z720 Tobacco use: Secondary | ICD-10-CM | POA: Insufficient documentation

## 2014-08-23 DIAGNOSIS — Z79899 Other long term (current) drug therapy: Secondary | ICD-10-CM | POA: Diagnosis not present

## 2014-08-23 DIAGNOSIS — R110 Nausea: Secondary | ICD-10-CM | POA: Diagnosis not present

## 2014-08-23 DIAGNOSIS — R05 Cough: Secondary | ICD-10-CM | POA: Diagnosis not present

## 2014-08-23 DIAGNOSIS — K219 Gastro-esophageal reflux disease without esophagitis: Secondary | ICD-10-CM | POA: Diagnosis not present

## 2014-08-23 DIAGNOSIS — H539 Unspecified visual disturbance: Secondary | ICD-10-CM | POA: Insufficient documentation

## 2014-08-23 DIAGNOSIS — E669 Obesity, unspecified: Secondary | ICD-10-CM | POA: Diagnosis not present

## 2014-08-23 DIAGNOSIS — R188 Other ascites: Secondary | ICD-10-CM | POA: Insufficient documentation

## 2014-08-23 DIAGNOSIS — I10 Essential (primary) hypertension: Secondary | ICD-10-CM | POA: Diagnosis not present

## 2014-08-23 DIAGNOSIS — R0602 Shortness of breath: Secondary | ICD-10-CM

## 2014-08-23 DIAGNOSIS — J441 Chronic obstructive pulmonary disease with (acute) exacerbation: Secondary | ICD-10-CM | POA: Diagnosis not present

## 2014-08-23 DIAGNOSIS — I2510 Atherosclerotic heart disease of native coronary artery without angina pectoris: Secondary | ICD-10-CM | POA: Insufficient documentation

## 2014-08-23 DIAGNOSIS — Z79818 Long term (current) use of other agents affecting estrogen receptors and estrogen levels: Secondary | ICD-10-CM | POA: Insufficient documentation

## 2014-08-23 DIAGNOSIS — Z880 Allergy status to penicillin: Secondary | ICD-10-CM | POA: Insufficient documentation

## 2014-08-23 DIAGNOSIS — E119 Type 2 diabetes mellitus without complications: Secondary | ICD-10-CM | POA: Diagnosis not present

## 2014-08-23 DIAGNOSIS — F329 Major depressive disorder, single episode, unspecified: Secondary | ICD-10-CM | POA: Diagnosis not present

## 2014-08-23 DIAGNOSIS — M1990 Unspecified osteoarthritis, unspecified site: Secondary | ICD-10-CM | POA: Insufficient documentation

## 2014-08-23 DIAGNOSIS — E780 Pure hypercholesterolemia: Secondary | ICD-10-CM | POA: Diagnosis not present

## 2014-08-23 DIAGNOSIS — R519 Headache, unspecified: Secondary | ICD-10-CM

## 2014-08-23 DIAGNOSIS — G8929 Other chronic pain: Secondary | ICD-10-CM | POA: Diagnosis not present

## 2014-08-23 DIAGNOSIS — Z86711 Personal history of pulmonary embolism: Secondary | ICD-10-CM | POA: Insufficient documentation

## 2014-08-23 LAB — CBC WITH DIFFERENTIAL/PLATELET
BASOS ABS: 0 10*3/uL (ref 0.0–0.1)
BASOS PCT: 0 % (ref 0–1)
EOS PCT: 1 % (ref 0–5)
Eosinophils Absolute: 0.1 10*3/uL (ref 0.0–0.7)
HEMATOCRIT: 37.2 % (ref 36.0–46.0)
Hemoglobin: 11.4 g/dL — ABNORMAL LOW (ref 12.0–15.0)
Lymphocytes Relative: 29 % (ref 12–46)
Lymphs Abs: 3.1 10*3/uL (ref 0.7–4.0)
MCH: 27.7 pg (ref 26.0–34.0)
MCHC: 30.6 g/dL (ref 30.0–36.0)
MCV: 90.5 fL (ref 78.0–100.0)
MONO ABS: 0.7 10*3/uL (ref 0.1–1.0)
Monocytes Relative: 6 % (ref 3–12)
Neutro Abs: 6.9 10*3/uL (ref 1.7–7.7)
Neutrophils Relative %: 64 % (ref 43–77)
Platelets: 157 10*3/uL (ref 150–400)
RBC: 4.11 MIL/uL (ref 3.87–5.11)
RDW: 15.7 % — ABNORMAL HIGH (ref 11.5–15.5)
WBC: 10.8 10*3/uL — ABNORMAL HIGH (ref 4.0–10.5)

## 2014-08-23 LAB — URINALYSIS, ROUTINE W REFLEX MICROSCOPIC
Bilirubin Urine: NEGATIVE
Glucose, UA: NEGATIVE mg/dL
Hgb urine dipstick: NEGATIVE
Ketones, ur: NEGATIVE mg/dL
LEUKOCYTES UA: NEGATIVE
NITRITE: NEGATIVE
PH: 5.5 (ref 5.0–8.0)
Protein, ur: NEGATIVE mg/dL
SPECIFIC GRAVITY, URINE: 1.012 (ref 1.005–1.030)
UROBILINOGEN UA: 0.2 mg/dL (ref 0.0–1.0)

## 2014-08-23 LAB — PRO B NATRIURETIC PEPTIDE: PRO B NATRI PEPTIDE: 1530 pg/mL — AB (ref 0–125)

## 2014-08-23 LAB — I-STAT TROPONIN, ED: Troponin i, poc: 0.03 ng/mL (ref 0.00–0.08)

## 2014-08-23 LAB — COMPREHENSIVE METABOLIC PANEL
ALBUMIN: 3 g/dL — AB (ref 3.5–5.2)
ALT: 19 U/L (ref 0–35)
ANION GAP: 10 (ref 5–15)
AST: 19 U/L (ref 0–37)
Alkaline Phosphatase: 98 U/L (ref 39–117)
BUN: 8 mg/dL (ref 6–23)
CALCIUM: 8.6 mg/dL (ref 8.4–10.5)
CO2: 29 mEq/L (ref 19–32)
CREATININE: 0.55 mg/dL (ref 0.50–1.10)
Chloride: 94 mEq/L — ABNORMAL LOW (ref 96–112)
GFR calc Af Amer: >90 mL/min (ref >90)
GFR calc non Af Amer: >90 mL/min (ref >90)
Glucose, Bld: 142 mg/dL — ABNORMAL HIGH (ref 70–99)
Potassium: 4.4 mEq/L (ref 3.7–5.3)
Sodium: 133 mEq/L — ABNORMAL LOW (ref 137–147)
Total Bilirubin: 0.4 mg/dL (ref 0.3–1.2)
Total Protein: 7.4 g/dL (ref 6.0–8.3)

## 2014-08-23 MED ORDER — OXYMETAZOLINE HCL 0.05 % NA SOLN: 1.0000 | Freq: Two times a day (BID) | NASAL | Status: DC

## 2014-08-23 MED ORDER — LORATADINE 10 MG PO TABS: 10.0000 mg | ORAL_TABLET | Freq: Every day | ORAL | Status: DC

## 2014-08-23 MED ORDER — KETOROLAC TROMETHAMINE 30 MG/ML IJ SOLN
30.0000 mg | Freq: Once | INTRAMUSCULAR | Status: AC
  Administered 2014-08-23: 30 mg via INTRAVENOUS
  Filled 2014-08-23: qty 1

## 2014-08-23 MED ORDER — MOMETASONE FUROATE 50 MCG/ACT NA SUSP: 2.0000 | Freq: Every day | NASAL | Status: DC

## 2014-08-23 MED ORDER — IOHEXOL 350 MG/ML SOLN
100.0000 mL | Freq: Once | INTRAVENOUS | Status: AC | PRN
  Administered 2014-08-23: 100 mL via INTRAVENOUS

## 2014-08-23 MED ORDER — AZITHROMYCIN 250 MG PO TABS: ORAL_TABLET | ORAL | Status: DC

## 2014-08-23 MED ORDER — FUROSEMIDE 10 MG/ML IJ SOLN
40.0000 mg | Freq: Once | INTRAMUSCULAR | Status: AC
  Administered 2014-08-23: 40 mg via INTRAVENOUS
  Filled 2014-08-23: qty 4

## 2014-08-23 NOTE — ED Notes (Signed)
The patient is unable to give an urine specimen at this time. The tech has reported to the RN in charge. 

## 2014-08-23 NOTE — ED Notes (Signed)
MD at bedside. 
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
## 2014-08-23 NOTE — ED Notes (Signed)
Pt ambulated with this RN and NT.

## 2014-08-23 NOTE — ED Notes (Signed)
Patient transported to CT 
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
## 2014-08-23 NOTE — Telephone Encounter (Signed)
[facility] pharmacy requesting clarification on Z-Pac order- 5 pills vs. 6 pills (Z-Pac comes in 6 pills per pharmacy).  NCM researched chart to find prescirpiton written for 5 tablets; advised to administer as written.

## 2014-08-23 NOTE — Discharge Instructions (Signed)
Sinus Headache °A sinus headache is when your sinuses become clogged or swollen. Sinus headaches can range from mild to severe.  °CAUSES °A sinus headache can have different causes, such as: °· Colds. °· Sinus infections. °· Allergies. °SYMPTOMS  °Symptoms of a sinus headache may vary and can include: °· Headache. °· Pain or pressure in the face. °· Congested or runny nose. °· Fever. °· Inability to smell. °· Pain in upper teeth. °Weather changes can make symptoms worse. °TREATMENT  °The treatment of a sinus headache depends on the cause. °· Sinus pain caused by a sinus infection may be treated with antibiotic medicine. °· Sinus pain caused by allergies may be helped by allergy medicines (antihistamines) and medicated nasal sprays. °· Sinus pain caused by congestion may be helped by flushing the nose and sinuses with saline solution. °HOME CARE INSTRUCTIONS  °· If antibiotics are prescribed, take them as directed. Finish them even if you start to feel better. °· Only take over-the-counter or prescription medicines for pain, discomfort, or fever as directed by your caregiver. °· If you have congestion, use a nasal spray to help reduce pressure. °SEEK IMMEDIATE MEDICAL CARE IF: °· You have a fever. °· You have headaches more than once a week. °· You have sensitivity to light or sound. °· You have repeated nausea and vomiting. °· You have vision problems. °· You have sudden, severe pain in your face or head. °· You have a seizure. °· You are confused. °· Your sinus headaches do not get better after treatment. Many people think they have a sinus headache when they actually have migraines or tension headaches. °MAKE SURE YOU:  °· Understand these instructions. °· Will watch your condition. °· Will get help right away if you are not doing well or get worse. °Document Released: 10/09/2004 Document Revised: 11/24/2011 Document Reviewed: 11/30/2010 °Exit[employer]® Patient Information ©2015 [employer]. This information is not  intended to replace advice given to you by your health care provider. Make sure you discuss any questions you have with your health care provider. ° °

## 2014-08-23 NOTE — ED Notes (Signed)
Pt states she wears 3L [place] at home as needed. Pt's O2 sats increased to 94% on 3L [place].

## 2014-09-25 DIAGNOSIS — Z79891 Long term (current) use of opiate analgesic: Secondary | ICD-10-CM | POA: Diagnosis not present

## 2014-09-25 DIAGNOSIS — G894 Chronic pain syndrome: Secondary | ICD-10-CM | POA: Diagnosis not present

## 2014-10-03 DIAGNOSIS — M5136 Other intervertebral disc degeneration, lumbar region: Secondary | ICD-10-CM | POA: Diagnosis not present

## 2014-10-16 ENCOUNTER — Encounter (HOSPITAL_COMMUNITY): Payer: Self-pay | Admitting: *Deleted

## 2014-10-16 ENCOUNTER — Emergency Department (HOSPITAL_COMMUNITY): Payer: Medicare Other

## 2014-10-16 ENCOUNTER — Emergency Department (HOSPITAL_COMMUNITY)
Admission: EM | Admit: 2014-10-16 | Discharge: 2014-10-16 | Disposition: A | Discharge status: Home / Self Care | Payer: Medicare Other | Attending: Emergency Medicine | Admitting: Emergency Medicine

## 2014-10-16 DIAGNOSIS — E780 Pure hypercholesterolemia: Secondary | ICD-10-CM | POA: Diagnosis not present

## 2014-10-16 DIAGNOSIS — R1084 Generalized abdominal pain: Secondary | ICD-10-CM | POA: Diagnosis not present

## 2014-10-16 DIAGNOSIS — I2510 Atherosclerotic heart disease of native coronary artery without angina pectoris: Secondary | ICD-10-CM | POA: Insufficient documentation

## 2014-10-16 DIAGNOSIS — Z720 Tobacco use: Secondary | ICD-10-CM | POA: Diagnosis not present

## 2014-10-16 DIAGNOSIS — M1990 Unspecified osteoarthritis, unspecified site: Secondary | ICD-10-CM | POA: Diagnosis not present

## 2014-10-16 DIAGNOSIS — J449 Chronic obstructive pulmonary disease, unspecified: Secondary | ICD-10-CM | POA: Insufficient documentation

## 2014-10-16 DIAGNOSIS — Z7952 Long term (current) use of systemic steroids: Secondary | ICD-10-CM | POA: Insufficient documentation

## 2014-10-16 DIAGNOSIS — Z86711 Personal history of pulmonary embolism: Secondary | ICD-10-CM | POA: Insufficient documentation

## 2014-10-16 DIAGNOSIS — Z79899 Other long term (current) drug therapy: Secondary | ICD-10-CM | POA: Insufficient documentation

## 2014-10-16 DIAGNOSIS — Z792 Long term (current) use of antibiotics: Secondary | ICD-10-CM | POA: Insufficient documentation

## 2014-10-16 DIAGNOSIS — Z90710 Acquired absence of both cervix and uterus: Secondary | ICD-10-CM | POA: Insufficient documentation

## 2014-10-16 DIAGNOSIS — R197 Diarrhea, unspecified: Secondary | ICD-10-CM | POA: Diagnosis not present

## 2014-10-16 DIAGNOSIS — Z880 Allergy status to penicillin: Secondary | ICD-10-CM | POA: Diagnosis not present

## 2014-10-16 DIAGNOSIS — K219 Gastro-esophageal reflux disease without esophagitis: Secondary | ICD-10-CM | POA: Diagnosis not present

## 2014-10-16 DIAGNOSIS — F329 Major depressive disorder, single episode, unspecified: Secondary | ICD-10-CM | POA: Diagnosis not present

## 2014-10-16 DIAGNOSIS — R110 Nausea: Secondary | ICD-10-CM | POA: Diagnosis not present

## 2014-10-16 DIAGNOSIS — R109 Unspecified abdominal pain: Secondary | ICD-10-CM | POA: Diagnosis not present

## 2014-10-16 DIAGNOSIS — I10 Essential (primary) hypertension: Secondary | ICD-10-CM | POA: Diagnosis not present

## 2014-10-16 DIAGNOSIS — E119 Type 2 diabetes mellitus without complications: Secondary | ICD-10-CM | POA: Insufficient documentation

## 2014-10-16 DIAGNOSIS — G8929 Other chronic pain: Secondary | ICD-10-CM | POA: Insufficient documentation

## 2014-10-16 DIAGNOSIS — Q762 Congenital spondylolisthesis: Secondary | ICD-10-CM | POA: Diagnosis not present

## 2014-10-16 LAB — BASIC METABOLIC PANEL
ANION GAP: 8 (ref 5–15)
CO2: 27 mmol/L (ref 19–32)
Calcium: 8.6 mg/dL (ref 8.4–10.5)
Chloride: 104 mmol/L (ref 96–112)
Creatinine, Ser: 0.64 mg/dL (ref 0.50–1.10)
GFR calc Af Amer: >90 mL/min (ref >90)
Glucose, Bld: 82 mg/dL (ref 70–99)
POTASSIUM: 3.6 mmol/L (ref 3.5–5.1)
SODIUM: 139 mmol/L (ref 135–145)

## 2014-10-16 LAB — CBC WITH DIFFERENTIAL/PLATELET
BASOS PCT: 0 % (ref 0–1)
Basophils Absolute: 0 10*3/uL (ref 0.0–0.1)
Eosinophils Absolute: 0.2 10*3/uL (ref 0.0–0.7)
Eosinophils Relative: 1 % (ref 0–5)
HEMATOCRIT: 39.1 % (ref 36.0–46.0)
Hemoglobin: 13 g/dL (ref 12.0–15.0)
LYMPHS PCT: 35 % (ref 12–46)
Lymphs Abs: 3.7 10*3/uL (ref 0.7–4.0)
MCH: 28.1 pg (ref 26.0–34.0)
MCHC: 33.2 g/dL (ref 30.0–36.0)
MCV: 84.4 fL (ref 78.0–100.0)
MONO ABS: 0.5 10*3/uL (ref 0.1–1.0)
Monocytes Relative: 5 % (ref 3–12)
Neutro Abs: 6 10*3/uL (ref 1.7–7.7)
Neutrophils Relative %: 59 % (ref 43–77)
Platelets: 170 10*3/uL (ref 150–400)
RBC: 4.63 MIL/uL (ref 3.87–5.11)
RDW: 14.5 % (ref 11.5–15.5)
WBC: 10.4 10*3/uL (ref 4.0–10.5)

## 2014-10-16 LAB — HEPATIC FUNCTION PANEL
ALT: 15 U/L (ref 0–35)
AST: 31 U/L (ref 0–37)
Albumin: 3.4 g/dL — ABNORMAL LOW (ref 3.5–5.2)
Alkaline Phosphatase: 82 U/L (ref 39–117)
BILIRUBIN DIRECT: 0.2 mg/dL (ref 0.0–0.5)
BILIRUBIN INDIRECT: 0.3 mg/dL (ref 0.3–0.9)
BILIRUBIN TOTAL: 0.5 mg/dL (ref 0.3–1.2)
Total Protein: 7.5 g/dL (ref 6.0–8.3)

## 2014-10-16 LAB — URINALYSIS, ROUTINE W REFLEX MICROSCOPIC
Bilirubin Urine: NEGATIVE
Glucose, UA: NEGATIVE mg/dL
Hgb urine dipstick: NEGATIVE
Ketones, ur: NEGATIVE mg/dL
LEUKOCYTES UA: NEGATIVE
Nitrite: NEGATIVE
PH: 5 (ref 5.0–8.0)
Protein, ur: NEGATIVE mg/dL
Specific Gravity, Urine: 1.006 (ref 1.005–1.030)
Urobilinogen, UA: 0.2 mg/dL (ref 0.0–1.0)

## 2014-10-16 MED ORDER — HYDROMORPHONE HCL 1 MG/ML IJ SOLN
2.0000 mg | Freq: Once | INTRAMUSCULAR | Status: AC
  Administered 2014-10-16: 2 mg via INTRAMUSCULAR
  Filled 2014-10-16: qty 2

## 2014-10-16 MED ORDER — HYDROMORPHONE HCL 1 MG/ML IJ SOLN
1.0000 mg | Freq: Once | INTRAMUSCULAR | Status: AC
  Administered 2014-10-16: 1 mg via INTRAVENOUS
  Filled 2014-10-16: qty 1

## 2014-10-16 MED ORDER — ONDANSETRON 4 MG PO TBDP
8.0000 mg | ORAL_TABLET | Freq: Once | ORAL | Status: AC
  Administered 2014-10-16: 8 mg via ORAL
  Filled 2014-10-16: qty 2

## 2014-10-16 MED ORDER — ONDANSETRON HCL 4 MG/2ML IJ SOLN
4.0000 mg | Freq: Once | INTRAMUSCULAR | Status: AC
  Administered 2014-10-16: 4 mg via INTRAVENOUS
  Filled 2014-10-16: qty 2

## 2014-10-16 NOTE — ED Notes (Signed)
Pt in xray

## 2014-10-16 NOTE — ED Notes (Signed)
Pt O2 decreased to the 60's. Applied 3L of O2 per [place] and O2 sat is 99%.

## 2014-10-16 NOTE — ED Notes (Addendum)
Pt states abdominal pain started Thursday evening after eating at [facility] and nausea began last night. Pt states pain in encompassing the entire abdomen and feels like a "contraction". Pt states she has been taking phenergan for nausea and Percocet 10mg  1 every 6 hrs. Pt states she normally has to have IV Dilaudid to control the pain when this happens. Pt feels like she may have a bowel blockage.

## 2014-10-16 NOTE — ED Notes (Signed)
Pt is in stable condition upon d/c and verbalizes understanding rt d/c instructions. Pt ambulates independently from ED and will return home via POV.
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
## 2014-10-16 NOTE — Discharge Instructions (Signed)

## 2014-10-17 LAB — URINE CULTURE: Special Requests: NORMAL

## 2014-10-19 DIAGNOSIS — E119 Type 2 diabetes mellitus without complications: Secondary | ICD-10-CM | POA: Diagnosis not present

## 2014-10-19 DIAGNOSIS — J45909 Unspecified asthma, uncomplicated: Secondary | ICD-10-CM | POA: Diagnosis not present

## 2014-10-19 DIAGNOSIS — I10 Essential (primary) hypertension: Secondary | ICD-10-CM | POA: Diagnosis not present

## 2014-10-19 DIAGNOSIS — K5901 Slow transit constipation: Secondary | ICD-10-CM | POA: Diagnosis not present

## 2014-10-19 DIAGNOSIS — D179 Benign lipomatous neoplasm, unspecified: Secondary | ICD-10-CM | POA: Diagnosis not present

## 2014-10-19 DIAGNOSIS — J309 Allergic rhinitis, unspecified: Secondary | ICD-10-CM | POA: Diagnosis not present

## 2014-10-19 DIAGNOSIS — R1011 Right upper quadrant pain: Secondary | ICD-10-CM | POA: Diagnosis not present

## 2014-10-23 ENCOUNTER — Other Ambulatory Visit (HOSPITAL_COMMUNITY): Payer: Self-pay | Admitting: Obstetrics and Gynecology

## 2014-10-23 DIAGNOSIS — K219 Gastro-esophageal reflux disease without esophagitis: Secondary | ICD-10-CM | POA: Diagnosis not present

## 2014-10-23 DIAGNOSIS — R1010 Upper abdominal pain, unspecified: Secondary | ICD-10-CM | POA: Diagnosis not present

## 2014-10-23 DIAGNOSIS — F419 Anxiety disorder, unspecified: Secondary | ICD-10-CM | POA: Diagnosis not present

## 2014-10-23 DIAGNOSIS — E119 Type 2 diabetes mellitus without complications: Secondary | ICD-10-CM | POA: Diagnosis not present

## 2014-10-23 DIAGNOSIS — R109 Unspecified abdominal pain: Secondary | ICD-10-CM

## 2014-11-03 ENCOUNTER — Ambulatory Visit (HOSPITAL_COMMUNITY)
Admission: RE | Admit: 2014-11-03 | Discharge: 2014-11-03 | Disposition: A | Discharge status: Home / Self Care | Payer: Medicare Other | Source: Ambulatory Visit | Attending: Obstetrics and Gynecology | Admitting: Obstetrics and Gynecology

## 2014-11-03 DIAGNOSIS — R109 Unspecified abdominal pain: Secondary | ICD-10-CM | POA: Insufficient documentation

## 2014-11-03 DIAGNOSIS — R188 Other ascites: Secondary | ICD-10-CM | POA: Diagnosis not present

## 2014-11-09 ENCOUNTER — Other Ambulatory Visit: Payer: Self-pay | Admitting: Gastroenterology

## 2014-11-09 DIAGNOSIS — K219 Gastro-esophageal reflux disease without esophagitis: Secondary | ICD-10-CM | POA: Diagnosis not present

## 2014-11-09 DIAGNOSIS — R1084 Generalized abdominal pain: Secondary | ICD-10-CM

## 2014-11-16 ENCOUNTER — Ambulatory Visit
Admission: RE | Admit: 2014-11-16 | Discharge: 2014-11-16 | Disposition: A | Discharge status: Home / Self Care | Payer: Medicare Other | Source: Ambulatory Visit | Attending: Gastroenterology | Admitting: Gastroenterology

## 2014-11-16 DIAGNOSIS — R634 Abnormal weight loss: Secondary | ICD-10-CM | POA: Diagnosis not present

## 2014-11-16 DIAGNOSIS — R1084 Generalized abdominal pain: Secondary | ICD-10-CM | POA: Diagnosis not present

## 2014-11-16 DIAGNOSIS — R188 Other ascites: Secondary | ICD-10-CM | POA: Diagnosis not present

## 2014-11-16 DIAGNOSIS — K7460 Unspecified cirrhosis of liver: Secondary | ICD-10-CM | POA: Diagnosis not present

## 2014-11-16 MED ORDER — IOPAMIDOL (ISOVUE-300) INJECTION 61%
125.0000 mL | Freq: Once | INTRAVENOUS | Status: AC | PRN
  Administered 2014-11-16: 125 mL via INTRAVENOUS

## 2014-12-09 ENCOUNTER — Emergency Department (HOSPITAL_COMMUNITY): Payer: Medicare Other

## 2014-12-09 ENCOUNTER — Inpatient Hospital Stay (HOSPITAL_COMMUNITY)
Admission: EM | Admit: 2014-12-09 | Discharge: 2014-12-11 | DRG: 392 | Disposition: A | Discharge status: Home / Self Care | Payer: Medicare Other | Attending: Internal Medicine | Admitting: Internal Medicine

## 2014-12-09 ENCOUNTER — Encounter (HOSPITAL_COMMUNITY): Payer: Self-pay | Admitting: Emergency Medicine

## 2014-12-09 DIAGNOSIS — M1990 Unspecified osteoarthritis, unspecified site: Secondary | ICD-10-CM | POA: Diagnosis present

## 2014-12-09 DIAGNOSIS — E119 Type 2 diabetes mellitus without complications: Secondary | ICD-10-CM | POA: Diagnosis present

## 2014-12-09 DIAGNOSIS — K5901 Slow transit constipation: Secondary | ICD-10-CM | POA: Diagnosis not present

## 2014-12-09 DIAGNOSIS — G43909 Migraine, unspecified, not intractable, without status migrainosus: Secondary | ICD-10-CM

## 2014-12-09 DIAGNOSIS — G43009 Migraine without aura, not intractable, without status migrainosus: Secondary | ICD-10-CM | POA: Diagnosis not present

## 2014-12-09 DIAGNOSIS — J45909 Unspecified asthma, uncomplicated: Secondary | ICD-10-CM | POA: Diagnosis present

## 2014-12-09 DIAGNOSIS — K7460 Unspecified cirrhosis of liver: Secondary | ICD-10-CM | POA: Diagnosis not present

## 2014-12-09 DIAGNOSIS — J449 Chronic obstructive pulmonary disease, unspecified: Secondary | ICD-10-CM | POA: Diagnosis not present

## 2014-12-09 DIAGNOSIS — R51 Headache: Secondary | ICD-10-CM

## 2014-12-09 DIAGNOSIS — G8929 Other chronic pain: Secondary | ICD-10-CM | POA: Diagnosis present

## 2014-12-09 DIAGNOSIS — Z888 Allergy status to other drugs, medicaments and biological substances status: Secondary | ICD-10-CM

## 2014-12-09 DIAGNOSIS — E785 Hyperlipidemia, unspecified: Secondary | ICD-10-CM | POA: Diagnosis present

## 2014-12-09 DIAGNOSIS — F17210 Nicotine dependence, cigarettes, uncomplicated: Secondary | ICD-10-CM | POA: Diagnosis present

## 2014-12-09 DIAGNOSIS — Z6839 Body mass index (BMI) 39.0-39.9, adult: Secondary | ICD-10-CM

## 2014-12-09 DIAGNOSIS — R109 Unspecified abdominal pain: Secondary | ICD-10-CM

## 2014-12-09 DIAGNOSIS — F329 Major depressive disorder, single episode, unspecified: Secondary | ICD-10-CM | POA: Diagnosis present

## 2014-12-09 DIAGNOSIS — I10 Essential (primary) hypertension: Secondary | ICD-10-CM | POA: Diagnosis present

## 2014-12-09 DIAGNOSIS — I2510 Atherosclerotic heart disease of native coronary artery without angina pectoris: Secondary | ICD-10-CM | POA: Diagnosis present

## 2014-12-09 DIAGNOSIS — K567 Ileus, unspecified: Secondary | ICD-10-CM | POA: Diagnosis present

## 2014-12-09 DIAGNOSIS — R1084 Generalized abdominal pain: Secondary | ICD-10-CM | POA: Diagnosis present

## 2014-12-09 DIAGNOSIS — Z79891 Long term (current) use of opiate analgesic: Secondary | ICD-10-CM

## 2014-12-09 DIAGNOSIS — K219 Gastro-esophageal reflux disease without esophagitis: Secondary | ICD-10-CM | POA: Diagnosis present

## 2014-12-09 DIAGNOSIS — R110 Nausea: Secondary | ICD-10-CM

## 2014-12-09 DIAGNOSIS — Z881 Allergy status to other antibiotic agents status: Secondary | ICD-10-CM

## 2014-12-09 DIAGNOSIS — G4730 Sleep apnea, unspecified: Secondary | ICD-10-CM | POA: Diagnosis present

## 2014-12-09 DIAGNOSIS — Z86711 Personal history of pulmonary embolism: Secondary | ICD-10-CM

## 2014-12-09 DIAGNOSIS — F1190 Opioid use, unspecified, uncomplicated: Secondary | ICD-10-CM

## 2014-12-09 DIAGNOSIS — Z720 Tobacco use: Secondary | ICD-10-CM | POA: Diagnosis not present

## 2014-12-09 DIAGNOSIS — E780 Pure hypercholesterolemia: Secondary | ICD-10-CM | POA: Diagnosis present

## 2014-12-09 DIAGNOSIS — K5904 Chronic idiopathic constipation: Secondary | ICD-10-CM

## 2014-12-09 DIAGNOSIS — Z90710 Acquired absence of both cervix and uterus: Secondary | ICD-10-CM

## 2014-12-09 DIAGNOSIS — K598 Other specified functional intestinal disorders: Secondary | ICD-10-CM | POA: Diagnosis not present

## 2014-12-09 DIAGNOSIS — I152 Hypertension secondary to endocrine disorders: Secondary | ICD-10-CM | POA: Diagnosis present

## 2014-12-09 DIAGNOSIS — E1159 Type 2 diabetes mellitus with other circulatory complications: Secondary | ICD-10-CM | POA: Diagnosis present

## 2014-12-09 HISTORY — DX: Generalized abdominal pain: R10.84

## 2014-12-09 HISTORY — DX: Migraine, unspecified, not intractable, without status migrainosus: G43.909

## 2014-12-09 HISTORY — DX: Slow transit constipation: K59.01

## 2014-12-09 LAB — URINALYSIS, ROUTINE W REFLEX MICROSCOPIC
Bilirubin Urine: NEGATIVE
Glucose, UA: NEGATIVE mg/dL
Hgb urine dipstick: NEGATIVE
Ketones, ur: NEGATIVE mg/dL
Leukocytes, UA: NEGATIVE
Nitrite: NEGATIVE
Protein, ur: NEGATIVE mg/dL
SPECIFIC GRAVITY, URINE: 1.006 (ref 1.005–1.030)
UROBILINOGEN UA: 0.2 mg/dL (ref 0.0–1.0)
pH: 5.5 (ref 5.0–8.0)

## 2014-12-09 LAB — COMPREHENSIVE METABOLIC PANEL
ALT: 14 U/L (ref 0–35)
AST: 26 U/L (ref 0–37)
Albumin: 3.6 g/dL (ref 3.5–5.2)
Alkaline Phosphatase: 84 U/L (ref 39–117)
Anion gap: 10 (ref 5–15)
BUN: 8 mg/dL (ref 6–23)
CALCIUM: 9 mg/dL (ref 8.4–10.5)
CHLORIDE: 101 mmol/L (ref 96–112)
CO2: 28 mmol/L (ref 19–32)
CREATININE: 0.62 mg/dL (ref 0.50–1.10)
GFR calc Af Amer: >90 mL/min (ref >90)
GFR calc non Af Amer: >90 mL/min (ref >90)
GLUCOSE: 82 mg/dL (ref 70–99)
Potassium: 3.8 mmol/L (ref 3.5–5.1)
Sodium: 139 mmol/L (ref 135–145)
Total Bilirubin: 0.5 mg/dL (ref 0.3–1.2)
Total Protein: 7.7 g/dL (ref 6.0–8.3)

## 2014-12-09 LAB — GLUCOSE, CAPILLARY
GLUCOSE-CAPILLARY: 77 mg/dL (ref 70–99)
Glucose-Capillary: 95 mg/dL (ref 70–99)
Glucose-Capillary: 87 mg/dL (ref 70–99)

## 2014-12-09 LAB — CBC WITH DIFFERENTIAL/PLATELET
BASOS ABS: 0 10*3/uL (ref 0.0–0.1)
Basophils Relative: 0 % (ref 0–1)
EOS ABS: 0.2 10*3/uL (ref 0.0–0.7)
EOS PCT: 2 % (ref 0–5)
HCT: 42.4 % (ref 36.0–46.0)
Hemoglobin: 14.1 g/dL (ref 12.0–15.0)
LYMPHS ABS: 3.2 10*3/uL (ref 0.7–4.0)
LYMPHS PCT: 40 % (ref 12–46)
MCH: 27.8 pg (ref 26.0–34.0)
MCHC: 33.3 g/dL (ref 30.0–36.0)
MCV: 83.6 fL (ref 78.0–100.0)
MONOS PCT: 5 % (ref 3–12)
Monocytes Absolute: 0.4 10*3/uL (ref 0.1–1.0)
NEUTROS PCT: 53 % (ref 43–77)
Neutro Abs: 4.1 10*3/uL (ref 1.7–7.7)
Platelets: 162 10*3/uL (ref 150–400)
RBC: 5.07 MIL/uL (ref 3.87–5.11)
RDW: 14.4 % (ref 11.5–15.5)
WBC: 7.9 10*3/uL (ref 4.0–10.5)

## 2014-12-09 LAB — LIPASE, BLOOD: Lipase: 32 U/L (ref 11–59)

## 2014-12-09 LAB — I-STAT CG4 LACTIC ACID, ED: Lactic Acid, Venous: 1.32 mmol/L (ref 0.5–2.0)

## 2014-12-09 MED ORDER — ALPRAZOLAM 0.5 MG PO TABS
2.0000 mg | ORAL_TABLET | Freq: Every day | ORAL | Status: DC
  Administered 2014-12-09 – 2014-12-10 (×2): 2 mg via ORAL
  Filled 2014-12-09 (×2): qty 4

## 2014-12-09 MED ORDER — HYDROMORPHONE HCL 1 MG/ML IJ SOLN
0.5000 mg | Freq: Four times a day (QID) | INTRAMUSCULAR | Status: DC | PRN
  Administered 2014-12-10 – 2014-12-11 (×3): 0.5 mg via INTRAVENOUS
  Filled 2014-12-09 (×3): qty 1

## 2014-12-09 MED ORDER — ALPRAZOLAM 0.5 MG PO TABS
1.0000 mg | ORAL_TABLET | Freq: Every day | ORAL | Status: DC
  Administered 2014-12-10 – 2014-12-11 (×2): 1 mg via ORAL
  Filled 2014-12-09 (×3): qty 2

## 2014-12-09 MED ORDER — SENNA 8.6 MG PO TABS
1.0000 | ORAL_TABLET | Freq: Two times a day (BID) | ORAL | Status: DC
  Administered 2014-12-09 – 2014-12-10 (×2): 8.6 mg via ORAL
  Filled 2014-12-09 (×3): qty 1

## 2014-12-09 MED ORDER — PANTOPRAZOLE SODIUM 40 MG PO TBEC
40.0000 mg | DELAYED_RELEASE_TABLET | Freq: Two times a day (BID) | ORAL | Status: DC
Start: 2014-12-09 — End: 2014-12-11
  Administered 2014-12-09 – 2014-12-11 (×4): 40 mg via ORAL
  Filled 2014-12-09 (×3): qty 1

## 2014-12-09 MED ORDER — BISACODYL 10 MG RE SUPP: 10.0000 mg | Freq: Every day | RECTAL | Status: DC | PRN

## 2014-12-09 MED ORDER — SODIUM CHLORIDE 0.9 % IV SOLN
INTRAVENOUS | Status: DC
  Administered 2014-12-09: 14:00:00 via INTRAVENOUS

## 2014-12-09 MED ORDER — ACETAMINOPHEN 325 MG PO TABS
650.0000 mg | ORAL_TABLET | Freq: Four times a day (QID) | ORAL | Status: DC | PRN
  Administered 2014-12-09 – 2014-12-10 (×3): 650 mg via ORAL
  Filled 2014-12-09 (×2): qty 2

## 2014-12-09 MED ORDER — VALPROATE SODIUM 500 MG/5ML IV SOLN
500.0000 mg | Freq: Once | INTRAVENOUS | Status: AC
  Administered 2014-12-09: 500 mg via INTRAVENOUS
  Filled 2014-12-09: qty 5

## 2014-12-09 MED ORDER — LIDOCAINE 5 % EX PTCH
1.0000 | MEDICATED_PATCH | CUTANEOUS | Status: DC
  Administered 2014-12-09 – 2014-12-10 (×2): 1 via TRANSDERMAL
  Filled 2014-12-09 (×3): qty 1

## 2014-12-09 MED ORDER — DOCUSATE SODIUM 100 MG PO CAPS
100.0000 mg | ORAL_CAPSULE | Freq: Two times a day (BID) | ORAL | Status: DC
  Administered 2014-12-09: 100 mg via ORAL
  Filled 2014-12-09 (×3): qty 1

## 2014-12-09 MED ORDER — SIMVASTATIN 20 MG PO TABS
20.0000 mg | ORAL_TABLET | Freq: Every day | ORAL | Status: DC
  Administered 2014-12-09 – 2014-12-10 (×2): 20 mg via ORAL
  Filled 2014-12-09 (×3): qty 1

## 2014-12-09 MED ORDER — ONDANSETRON HCL 4 MG PO TABS: 4.0000 mg | ORAL_TABLET | Freq: Four times a day (QID) | ORAL | Status: DC | PRN

## 2014-12-09 MED ORDER — INSULIN ASPART 100 UNIT/ML ~~LOC~~ SOLN: 0.0000 [IU] | SUBCUTANEOUS | Status: DC

## 2014-12-09 MED ORDER — IOHEXOL 300 MG/ML  SOLN
25.0000 mL | Freq: Once | INTRAMUSCULAR | Status: AC | PRN
  Administered 2014-12-09: 25 mL via ORAL

## 2014-12-09 MED ORDER — POLYETHYLENE GLYCOL 3350 17 G PO PACK
17.0000 g | PACK | Freq: Three times a day (TID) | ORAL | Status: DC
  Administered 2014-12-09 (×2): 17 g via ORAL
  Filled 2014-12-09 (×5): qty 1

## 2014-12-09 MED ORDER — ALBUTEROL SULFATE (2.5 MG/3ML) 0.083% IN NEBU: 2.5000 mg | INHALATION_SOLUTION | RESPIRATORY_TRACT | Status: DC | PRN

## 2014-12-09 MED ORDER — OXYCODONE HCL 5 MG PO TABS
5.0000 mg | ORAL_TABLET | Freq: Four times a day (QID) | ORAL | Status: DC | PRN
  Administered 2014-12-09 – 2014-12-10 (×3): 5 mg via ORAL
  Filled 2014-12-09 (×3): qty 1

## 2014-12-09 MED ORDER — ESTRADIOL 2 MG PO TABS
2.0000 mg | ORAL_TABLET | Freq: Every day | ORAL | Status: DC
  Administered 2014-12-10 – 2014-12-11 (×2): 2 mg via ORAL
  Filled 2014-12-09 (×2): qty 1

## 2014-12-09 MED ORDER — ONDANSETRON HCL 4 MG/2ML IJ SOLN
4.0000 mg | Freq: Four times a day (QID) | INTRAMUSCULAR | Status: DC | PRN
  Administered 2014-12-09 – 2014-12-10 (×2): 4 mg via INTRAVENOUS
  Filled 2014-12-09 (×2): qty 2

## 2014-12-09 MED ORDER — DEXTROSE-NACL 5-0.9 % IV SOLN
INTRAVENOUS | Status: DC
  Administered 2014-12-09: 21:00:00 via INTRAVENOUS
  Administered 2014-12-11: 1 mL via INTRAVENOUS
  Filled 2014-12-09: qty 1000

## 2014-12-09 MED ORDER — HYDROMORPHONE HCL 1 MG/ML IJ SOLN
0.5000 mg | Freq: Once | INTRAMUSCULAR | Status: AC
  Administered 2014-12-09: 0.5 mg via INTRAVENOUS
  Filled 2014-12-09: qty 1

## 2014-12-09 MED ORDER — ENOXAPARIN SODIUM 40 MG/0.4ML ~~LOC~~ SOLN
40.0000 mg | SUBCUTANEOUS | Status: DC
  Administered 2014-12-09 – 2014-12-10 (×2): 40 mg via SUBCUTANEOUS
  Filled 2014-12-09 (×3): qty 0.4

## 2014-12-09 MED ORDER — CYCLOBENZAPRINE HCL 10 MG PO TABS
10.0000 mg | ORAL_TABLET | Freq: Three times a day (TID) | ORAL | Status: DC | PRN
  Administered 2014-12-09: 10 mg via ORAL
  Filled 2014-12-09: qty 1

## 2014-12-09 MED ORDER — ACETAMINOPHEN 650 MG RE SUPP: 650.0000 mg | Freq: Four times a day (QID) | RECTAL | Status: DC | PRN

## 2014-12-09 MED ORDER — ONDANSETRON HCL 4 MG/2ML IJ SOLN
4.0000 mg | Freq: Once | INTRAMUSCULAR | Status: AC
  Administered 2014-12-09: 4 mg via INTRAVENOUS
  Filled 2014-12-09: qty 2

## 2014-12-09 MED ORDER — OXYCODONE-ACETAMINOPHEN 5-325 MG PO TABS
1.0000 | ORAL_TABLET | Freq: Four times a day (QID) | ORAL | Status: DC | PRN
  Administered 2014-12-09 – 2014-12-10 (×3): 1 via ORAL
  Filled 2014-12-09 (×3): qty 1

## 2014-12-09 MED ORDER — NITROGLYCERIN 0.4 MG SL SUBL
0.4000 mg | SUBLINGUAL_TABLET | SUBLINGUAL | Status: DC | PRN
Start: 2014-12-09 — End: 2014-12-11

## 2014-12-09 MED ORDER — IOHEXOL 300 MG/ML  SOLN
100.0000 mL | Freq: Once | INTRAMUSCULAR | Status: AC | PRN
  Administered 2014-12-09: 100 mL via INTRAVENOUS

## 2014-12-09 MED ORDER — SODIUM CHLORIDE 0.9 % IV SOLN
Freq: Once | INTRAVENOUS | Status: AC
  Administered 2014-12-09: 09:00:00 via INTRAVENOUS

## 2014-12-09 MED ORDER — LORATADINE 10 MG PO TABS
10.0000 mg | ORAL_TABLET | Freq: Every day | ORAL | Status: DC
  Filled 2014-12-09: qty 1

## 2014-12-09 MED ORDER — OXYCODONE-ACETAMINOPHEN 10-325 MG PO TABS: 1.0000 | ORAL_TABLET | Freq: Three times a day (TID) | ORAL | Status: DC | PRN

## 2014-12-09 NOTE — ED Notes (Signed)
Pt c/o migraine and having adhesions and scar tissue in her colon causing difficulty with bowels.
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
## 2014-12-09 NOTE — ED Notes (Signed)
Pt reports a migraine x6 days.  Pt reports that her medicine helps for a short time but then the headache returns.  Pt also reports chronic constipation.  Pt reports she had a BM on Thursday.  Pt is being seen by a gastrologist and pt reports that she has scar tissue in her colon which makes it difficult for her to pass stool.  Pt c/o diffuse lower abdominal pain.

## 2014-12-09 NOTE — ED Notes (Signed)
Attempted to call report to floor, nurse is unavailable to take report at this time but will return call.

## 2014-12-09 NOTE — Plan of Care (Signed)
Problem: Phase III Progression Outcomes Goal: Foley discontinued Outcome: Not Applicable Date Met:  09/81/19 Pt voids
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
## 2014-12-09 NOTE — Progress Notes (Signed)
Pt has blood sugar 77 mg/dl. Pt c/o shaky and nausea . Pt is currently NPO. On-call provider Fredirick Maudlin, NP notified via text page. Pt is medicated with prn IV zofran and IV fluid changed to D5NS. Will monitor.

## 2014-12-09 NOTE — ED Notes (Signed)
Pt completed contrast and CT made aware.

## 2014-12-10 ENCOUNTER — Observation Stay (HOSPITAL_COMMUNITY): Payer: Medicare Other

## 2014-12-10 DIAGNOSIS — K5900 Constipation, unspecified: Secondary | ICD-10-CM | POA: Diagnosis not present

## 2014-12-10 DIAGNOSIS — I10 Essential (primary) hypertension: Secondary | ICD-10-CM | POA: Diagnosis present

## 2014-12-10 DIAGNOSIS — K598 Other specified functional intestinal disorders: Secondary | ICD-10-CM | POA: Diagnosis not present

## 2014-12-10 DIAGNOSIS — K219 Gastro-esophageal reflux disease without esophagitis: Secondary | ICD-10-CM | POA: Diagnosis present

## 2014-12-10 DIAGNOSIS — E119 Type 2 diabetes mellitus without complications: Secondary | ICD-10-CM | POA: Diagnosis present

## 2014-12-10 DIAGNOSIS — E785 Hyperlipidemia, unspecified: Secondary | ICD-10-CM | POA: Diagnosis present

## 2014-12-10 DIAGNOSIS — Z79891 Long term (current) use of opiate analgesic: Secondary | ICD-10-CM | POA: Diagnosis not present

## 2014-12-10 DIAGNOSIS — F329 Major depressive disorder, single episode, unspecified: Secondary | ICD-10-CM | POA: Diagnosis present

## 2014-12-10 DIAGNOSIS — Z90710 Acquired absence of both cervix and uterus: Secondary | ICD-10-CM | POA: Diagnosis not present

## 2014-12-10 DIAGNOSIS — J449 Chronic obstructive pulmonary disease, unspecified: Secondary | ICD-10-CM | POA: Diagnosis present

## 2014-12-10 DIAGNOSIS — G4730 Sleep apnea, unspecified: Secondary | ICD-10-CM | POA: Diagnosis present

## 2014-12-10 DIAGNOSIS — M1990 Unspecified osteoarthritis, unspecified site: Secondary | ICD-10-CM | POA: Diagnosis present

## 2014-12-10 DIAGNOSIS — K5901 Slow transit constipation: Secondary | ICD-10-CM | POA: Diagnosis present

## 2014-12-10 DIAGNOSIS — G43909 Migraine, unspecified, not intractable, without status migrainosus: Secondary | ICD-10-CM | POA: Diagnosis present

## 2014-12-10 DIAGNOSIS — Z86711 Personal history of pulmonary embolism: Secondary | ICD-10-CM | POA: Diagnosis not present

## 2014-12-10 DIAGNOSIS — F17210 Nicotine dependence, cigarettes, uncomplicated: Secondary | ICD-10-CM | POA: Diagnosis present

## 2014-12-10 DIAGNOSIS — K567 Ileus, unspecified: Secondary | ICD-10-CM | POA: Diagnosis present

## 2014-12-10 DIAGNOSIS — J45909 Unspecified asthma, uncomplicated: Secondary | ICD-10-CM | POA: Diagnosis present

## 2014-12-10 DIAGNOSIS — R109 Unspecified abdominal pain: Secondary | ICD-10-CM | POA: Diagnosis present

## 2014-12-10 DIAGNOSIS — G8929 Other chronic pain: Secondary | ICD-10-CM | POA: Diagnosis present

## 2014-12-10 DIAGNOSIS — R1084 Generalized abdominal pain: Secondary | ICD-10-CM | POA: Diagnosis not present

## 2014-12-10 DIAGNOSIS — Z888 Allergy status to other drugs, medicaments and biological substances status: Secondary | ICD-10-CM | POA: Diagnosis not present

## 2014-12-10 DIAGNOSIS — Z6839 Body mass index (BMI) 39.0-39.9, adult: Secondary | ICD-10-CM | POA: Diagnosis not present

## 2014-12-10 DIAGNOSIS — I2510 Atherosclerotic heart disease of native coronary artery without angina pectoris: Secondary | ICD-10-CM | POA: Diagnosis present

## 2014-12-10 DIAGNOSIS — K7460 Unspecified cirrhosis of liver: Secondary | ICD-10-CM | POA: Diagnosis present

## 2014-12-10 DIAGNOSIS — Z881 Allergy status to other antibiotic agents status: Secondary | ICD-10-CM | POA: Diagnosis not present

## 2014-12-10 DIAGNOSIS — E780 Pure hypercholesterolemia: Secondary | ICD-10-CM | POA: Diagnosis present

## 2014-12-10 LAB — GLUCOSE, CAPILLARY
GLUCOSE-CAPILLARY: 85 mg/dL (ref 70–99)
GLUCOSE-CAPILLARY: 101 mg/dL — AB (ref 70–99)
Glucose-Capillary: 104 mg/dL — ABNORMAL HIGH (ref 70–99)
Glucose-Capillary: 90 mg/dL (ref 70–99)
Glucose-Capillary: 93 mg/dL (ref 70–99)

## 2014-12-10 MED ORDER — POLYETHYLENE GLYCOL 3350 17 G PO PACK
17.0000 g | PACK | Freq: Four times a day (QID) | ORAL | Status: DC
  Administered 2014-12-10: 17 g via ORAL
  Filled 2014-12-10 (×3): qty 1

## 2014-12-10 MED ORDER — LACTULOSE 10 GM/15ML PO SOLN
30.0000 g | Freq: Four times a day (QID) | ORAL | Status: DC
  Administered 2014-12-10 (×2): 30 g via ORAL
  Filled 2014-12-10 (×7): qty 60

## 2014-12-10 MED ORDER — POLYETHYLENE GLYCOL 3350 17 G PO PACK
17.0000 g | PACK | Freq: Every day | ORAL | Status: DC
  Administered 2014-12-11: 17 g via ORAL
  Filled 2014-12-10: qty 1

## 2014-12-10 MED ORDER — DOCUSATE SODIUM 100 MG PO CAPS
200.0000 mg | ORAL_CAPSULE | Freq: Four times a day (QID) | ORAL | Status: DC
  Administered 2014-12-10: 200 mg via ORAL

## 2014-12-10 MED ORDER — LORATADINE 10 MG PO TABS
10.0000 mg | ORAL_TABLET | Freq: Every day | ORAL | Status: DC
  Administered 2014-12-10 – 2014-12-11 (×2): 10 mg via ORAL
  Filled 2014-12-10 (×4): qty 1

## 2014-12-10 MED ORDER — METHYLNALTREXONE BROMIDE 12 MG/0.6ML ~~LOC~~ SOLN
12.0000 mg | SUBCUTANEOUS | Status: DC
  Filled 2014-12-10: qty 0.6

## 2014-12-10 MED ORDER — BISACODYL 10 MG RE SUPP
10.0000 mg | Freq: Two times a day (BID) | RECTAL | Status: DC
  Filled 2014-12-10: qty 1

## 2014-12-10 MED ORDER — LACTULOSE 10 GM/15ML PO SOLN
30.0000 g | Freq: Four times a day (QID) | ORAL | Status: DC
  Filled 2014-12-10 (×4): qty 45

## 2014-12-10 MED ORDER — DOCUSATE SODIUM 100 MG PO CAPS
200.0000 mg | ORAL_CAPSULE | Freq: Every day | ORAL | Status: DC
  Administered 2014-12-11: 200 mg via ORAL
  Filled 2014-12-10: qty 2

## 2014-12-10 MED ORDER — FLUTICASONE PROPIONATE 50 MCG/ACT NA SUSP
2.0000 | Freq: Every day | NASAL | Status: DC
  Administered 2014-12-10 – 2014-12-11 (×2): 2 via NASAL
  Filled 2014-12-10: qty 16

## 2014-12-10 MED ORDER — METHYLNALTREXONE BROMIDE 12 MG/0.6ML ~~LOC~~ SOLN
12.0000 mg | SUBCUTANEOUS | Status: DC
Start: 2014-12-10 — End: 2014-12-10
  Administered 2014-12-10: 12 mg via SUBCUTANEOUS
  Filled 2014-12-10: qty 0.6

## 2014-12-10 MED ORDER — DOCUSATE SODIUM 100 MG PO CAPS
200.0000 mg | ORAL_CAPSULE | Freq: Four times a day (QID) | ORAL | Status: DC
  Administered 2014-12-10: 200 mg via ORAL
  Filled 2014-12-10: qty 2

## 2014-12-10 MED ORDER — DOCUSATE SODIUM 100 MG PO CAPS: 200.0000 mg | ORAL_CAPSULE | Freq: Two times a day (BID) | ORAL | Status: DC

## 2014-12-10 MED ORDER — POLYETHYLENE GLYCOL 3350 17 G PO PACK
17.0000 g | PACK | Freq: Four times a day (QID) | ORAL | Status: DC
  Administered 2014-12-10: 17 g via ORAL
  Filled 2014-12-10 (×4): qty 1

## 2014-12-10 NOTE — Progress Notes (Signed)
Patient complaining of nasal congestion, requesting flonase. Rogue Bussing, NP paged. Orders to be placed.

## 2014-12-10 NOTE — Progress Notes (Addendum)
Patient Demographics  [name], is a 54 y.o. female, DOB - 1960-09-21, AQT:[id number]  Admit date - 12/09/2014   Admitting Physician Modena Jansky, MD  Outpatient Primary MD for the patient is Shirline Frees, MD  LOS -    Chief Complaint  Patient presents with  . Migraine  . Constipation        Subjective:   [name] today has, No headache, No chest pain, mild generalised abdominal pain - No Nausea, No new weakness tingling or numbness, No Cough - SOB.    Assessment & Plan    1. Generalized abdominal pain. CT abdomen pelvis nonacute, abdominal x-ray shows massive stool burden. Has history of narcotic bowel in this presentation is consistent with the same. She was seen in October of last year with similar presentation and responded to [place]. An bowel regimen. Her abdominal exam is benign. Seen by general surgery. Will be placed on bowel regimen. Have discussed with general surgery the plan and today's x-ray. Relistor has been ordered once available in the hospital patient will get the dose. Counseled her extensively to cut back on narcotic use which she appears to have no will to do. For now nothing by mouth except medications until she has bowel movement   2. Chronic pain. Minimize narcotic dose here due to #1 above. We will follow with primary pain specialist postdischarge. Extensively counseled to cut down on narcotics.    3. Migraine headaches. On Depakote currently IV. As recommended by neurologist to the ER physician.   4. Tobacco abuse. Counseled to quit.   5. Dyslipidemia. Continue statin.   6. COPD. Stable.   7.DM2 - SSI  CBG (last 3)   Recent Labs  12/10/14 0045 12/10/14 0439 12/10/14 0807  GLUCAP 93 104* 85       Code Status: full  Family  Communication: none present  Disposition Plan: Home   Procedures CT Abd Pelvis   Consults  CCS   Medications  Scheduled Meds: . ALPRAZolam  1 mg Oral Daily  . ALPRAZolam  2 mg Oral QHS  . bisacodyl  10 mg Rectal BID  . docusate sodium  200 mg Oral Q6H  . enoxaparin (LOVENOX) injection  40 mg Subcutaneous Q24H  . estradiol  2 mg Oral Daily  . fluticasone  2 spray Each Nare Daily  . insulin aspart  0-9 Units Subcutaneous 6 times per day  . lactulose  30 g Oral Q6H  . lidocaine  1 patch Transdermal Q24H  . loratadine  10 mg Oral Daily  . methylnaltrexone  12 mg Subcutaneous QODAY  . pantoprazole  40 mg Oral BID AC  . polyethylene glycol  17 g Oral Q6H  . senna  1 tablet Oral BID  . simvastatin  20 mg Oral QHS   Continuous Infusions: . dextrose 5 % and 0.9% NaCl 75 mL/hr at 12/09/14 2100   PRN Meds:.acetaminophen **OR** acetaminophen, albuterol, cyclobenzaprine, HYDROmorphone (DILAUDID) injection, nitroGLYCERIN, ondansetron **OR** ondansetron (ZOFRAN) IV  DVT Prophylaxis  Lovenox    Lab Results  Component Value Date   PLT 162 12/09/2014    Antibiotics     Anti-infectives    None          Objective:   Filed  Vitals:   12/09/14 1344 12/09/14 2131 12/10/14 0053 12/10/14 0528  BP: 152/82 144/79  136/62  Pulse: 84 83 91 80  Temp: 98.2 F (36.8 C) 97.7 F (36.5 C)  98.5 F (36.9 C)  TempSrc: Oral Oral  Oral  Resp: 16 18  18   Height: 5' 2"  (1.575 m)     Weight: 89.721 kg (197 lb 12.8 oz)     SpO2: 97% 90% 96% 90%    Wt Readings from Last 3 Encounters:  12/09/14 89.721 kg (197 lb 12.8 oz)  08/23/14 106.595 kg (235 lb)  07/11/14 106.6 kg (235 lb 0.2 oz)     Intake/Output Summary (Last 24 hours) at 12/10/14 0953 Last data filed at 12/10/14 0911  Gross per 24 hour  Intake 878.75 ml  Output    800 ml  Net  78.75 ml     Physical Exam  Awake Alert, Oriented X 3, No new F.N deficits, Normal affect [place].AT,PERRAL Supple Neck,No JVD, No cervical  lymphadenopathy appriciated.  Symmetrical Chest wall movement, Good air movement bilaterally, CTAB RRR,No Gallops,Rubs or new Murmurs, No Parasternal Heave +ve B.Sounds, Abd Soft, No tenderness, No organomegaly appriciated, No rebound - guarding or rigidity. No Cyanosis, Clubbing or edema, No new Rash or bruise      Data Review   Micro Results No results found for this or any previous visit (from the past 240 hour(s)).  Radiology Reports Abd 1 View (kub)  12/10/2014   CLINICAL DATA:  Abdominal pain  EXAM: ABDOMEN - 1 VIEW  COMPARISON:  12/09/2014 CT abdomen/pelvis  FINDINGS: Again noted is an mildly dilated left upper quadrant abdominal loop of small bowel measuring 3.8 cm maximally. Residual contrast noted in nondilated colon. Presence or absence of air-fluid levels or free air is suboptimally evaluated on this supine projection. No new abnormal radiopacity. No acute osseous finding.  IMPRESSION: Single dilated gas-filled left mid abdominal small bowel loop which could indicate partial/early small bowel obstruction or focal ileus.   Electronically Signed   By: Conchita Paris M.D.   On: 12/10/2014 09:41   Ct Abdomen Pelvis W Contrast  12/09/2014   CLINICAL DATA:  Abdominal pain for 10 days. Prior history of bowel surgery.  EXAM: CT ABDOMEN AND PELVIS WITH CONTRAST  TECHNIQUE: Multidetector CT imaging of the abdomen and pelvis was performed using the standard protocol following bolus administration of intravenous contrast.  CONTRAST:  165m OMNIPAQUE IOHEXOL 300 MG/ML  SOLN  COMPARISON:  11/16/2014  FINDINGS: Lower chest: Curvilinear left lower lobe subpleural scarring stable.  Hepatobiliary: Nodular hepatic contour which may suggest cirrhosis is reidentified. Subcapsular hepatic calcification right lower lobe posterior segment image 33 reidentified. No focal hepatic mass is otherwise identified. Gallbladder is distended but unremarkable. No common duct or pancreatic ductal dilatation.  Pancreas:  Normal  Spleen: Sub cm too small to characterize splenic lesions are stable, most likely benign findings such as hemangiomas or lymphangiomas. An oval 3 cm mass at the superior aspect of the spleen measuring higher than expected for fluid attenuation image 16 is reidentified and stable since prior exam dating back to at least 11/16/2010, compatible with a benign finding such as hemangioma or lymphangioma.  Adrenals/Urinary Tract: Adrenal glands appear normal. Kidneys are normal. No hydroureteronephrosis. No radiopaque renal, ureteral, or bladder calculus.  Stomach/Bowel: Stomach appears unremarkable. Prominence of left upper quadrant and left mid abdominal loops of small bowel, predominantly proximal jejunum, is noted, with maximal diameter 3.5 cm image 35. No wall thickening is identified. There  is gradual tapering of caliber to normal appearing mid to distal small bowel. Moderate stool burden.  Vascular/Lymphatic: Mild atheromatous aortic calcification noted without aneurysm. Subjectively prominent gastrohepatic and porta hepatis nodes are reidentified, with dominant 1.5 cm gastrohepatic node identified image 31, unchanged. Sub cm root of small bowel mesentery lymph nodes are reidentified.  Reproductive: Uterus presumed surgically absent. Neither ovary is visualized. No right adnexal mass. 0.8 cm ovoid left pelvic sidewall mass image 71 is reidentified which could represent ovarian remnant, lymph node, or other likely benign finding given stability over prior exams.  Other: Moderate ascites tracks to the pelvis. Overall, this is not significantly changed.  Musculoskeletal: Bilateral L5 pars interarticularis defects are noted. Lumbar spine degenerative change reidentified. No acute osseous abnormality.  IMPRESSION: No significant change in nodular hepatic contour suggesting cirrhosis with diffuse abdominal ascites and likely reactive porta hepatis and mesenteric borderline lymphadenopathy.  Mild dilatation of left  upper quadrant loops of jejunum without wall thickening or other complicating feature. This may be a normal variant especially in this location, or could reflect focal ileus or early/ partial small bowel obstruction.   Electronically Signed   By: Conchita Paris M.D.   On: 12/09/2014 10:28   Ct Entero Abd/pelvis W/cm  11/16/2014   CLINICAL DATA:  Generalized abdominal pain. 6 lb weight loss over past 2 weeks. Clinical suspicion for inflammatory bowel disease. Cirrhosis.  EXAM: CT ABDOMEN AND PELVIS WITH CONTRAST (ENTEROGRAPHY)  TECHNIQUE: Multidetector CT of the abdomen and pelvis during bolus administration of intravenous contrast. Negative oral contrast VoLumen was given.  CONTRAST:  125 mL Isovue 300  COMPARISON:  07/10/2014 and 12/24/2012  FINDINGS: Lower Chest:  Unremarkable.  Hepatobiliary: Hepatic cirrhosis is again demonstrated, however no liver masses are identified. Portal veins remain patent. Moderate perihepatic ascites has not significant changed. Gallbladder is unremarkable.  Pancreas: No mass, inflammatory changes, or other significant abnormality identified.  Spleen: No evidence of splenomegaly. 3 cm low-attenuation mass in the superior aspect of the spleen is stable since 2014 exam, consistent with benign etiology.  Adrenals:  No masses identified.  Kidneys/Urinary Tract:  No evidence of masses or hydronephrosis.  Stomach/Bowel/Peritoneum: No evidence of small bowel wall thickening or dilatation. No evidence of abnormal mucosal enhancement or mesenteric inflammatory changes. No evidence of fistulization or extraluminal gas or fluid collections. The terminal ileum is normal in appearance. No other areas of bowel wall thickening identified.  Mild to moderate ascites and diffuse mesenteric edema show no significant change compared to previous exam.  Vascular/Lymphatic: No pathologically enlarged lymph nodes identified. Shotty subcentimeter lymph nodes in the porta hepatis, gastrohepatic ligament, and  right cardiophrenic angle show no significant change. No other significant abnormality visualized.  Reproductive: No mass or other significant abnormality identified. Prior hysterectomy noted.  Other:  None.  Musculoskeletal:  No suspicious bone lesions identified.  IMPRESSION: No radiographic evidence of inflammatory bowel disease or other acute findings.  Stable hepatic cirrhosis, mild to moderate ascites, and diffuse mesenteric edema. No evidence of hepatic neoplasm.  Stable 3 cm low-attenuation splenic lesion, consistent with benign etiology. No evidence of splenomegaly.   Electronically Signed   By: Earle Gell M.D.   On: 11/16/2014 12:13     CBC  Recent Labs Lab 12/09/14 0826  WBC 7.9  HGB 14.1  HCT 42.4  PLT 162  MCV 83.6  MCH 27.8  MCHC 33.3  RDW 14.4  LYMPHSABS 3.2  MONOABS 0.4  EOSABS 0.2  BASOSABS 0.0    Chemistries  Recent Labs Lab 12/09/14 0826  NA 139  K 3.8  CL 101  CO2 28  GLUCOSE 82  BUN 8  CREATININE 0.62  CALCIUM 9.0  AST 26  ALT 14  ALKPHOS 84  BILITOT 0.5   ------------------------------------------------------------------------------------------------------------------ estimated creatinine clearance is 82.7 mL/min (by C-G formula based on Cr of 0.62). ------------------------------------------------------------------------------------------------------------------ No results for input(s): HGBA1C in the last 72 hours. ------------------------------------------------------------------------------------------------------------------ No results for input(s): CHOL, HDL, LDLCALC, TRIG, CHOLHDL, LDLDIRECT in the last 72 hours. ------------------------------------------------------------------------------------------------------------------ No results for input(s): TSH, T4TOTAL, T3FREE, THYROIDAB in the last 72 hours.  Invalid input(s):  FREET3 ------------------------------------------------------------------------------------------------------------------ No results for input(s): VITAMINB12, FOLATE, FERRITIN, TIBC, IRON, RETICCTPCT in the last 72 hours.  Coagulation profile No results for input(s): INR, PROTIME in the last 168 hours.  No results for input(s): DDIMER in the last 72 hours.  Cardiac Enzymes No results for input(s): CKMB, TROPONINI, MYOGLOBIN in the last 168 hours.  Invalid input(s): CK ------------------------------------------------------------------------------------------------------------------ Invalid input(s): POCBNP     Time Spent in minutes  35   Arlone Lenhardt K M.D on 12/10/2014 at 9:53 AM  Between 7am to 7pm - Pager - [phone]  After 7pm go to [email or web address] - password [facility]  [facility]   Office  [phone]

## 2014-12-10 NOTE — Progress Notes (Signed)
UR completed 

## 2014-12-10 NOTE — Progress Notes (Addendum)
Pt refusing to keep completely NPO, patient walking out of room to get ice chips and water from hydration station @1935  MD Rogue Bussing paged, night shift RN aware and to contact night shift MD.

## 2014-12-10 NOTE — Progress Notes (Signed)
Subjective: Chronic abdominal pain is the same.  Objective: Vital signs in last 24 hours: Temp:  [97.7 F (36.5 C)-98.5 F (36.9 C)] 98.5 F (36.9 C) (03/27 0528) Pulse Rate:  [80-91] 80 (03/27 0528) Resp:  [16-18] 18 (03/27 0528) BP: (136-164)/(62-92) 136/62 mmHg (03/27 0528) SpO2:  [90 %-99 %] 90 % (03/27 0528) Weight:  [89.721 kg (197 lb 12.8 oz)] 89.721 kg (197 lb 12.8 oz) (03/26 1344) Last BM Date: 12/07/14 (per pt)  Intake/Output from previous day: 03/26 0701 - 03/27 0700 In: 878.8 [P.O.:30; I.V.:848.8] Out: 800 [Urine:800] Intake/Output this shift:    PE: General- In NAD Abdomen-soft,midline scar, no tenderness  Lab Results:   Recent Labs  12/09/14 0826  WBC 7.9  HGB 14.1  HCT 42.4  PLT 162   BMET  Recent Labs  12/09/14 0826  NA 139  K 3.8  CL 101  CO2 28  GLUCOSE 82  BUN 8  CREATININE 0.62  CALCIUM 9.0   PT/INR No results for input(s): LABPROT, INR in the last 72 hours. Comprehensive Metabolic Panel:    Component Value Date/Time   NA 139 12/09/2014 0826   NA 139 10/16/2014 1049   K 3.8 12/09/2014 0826   K 3.6 10/16/2014 1049   CL 101 12/09/2014 0826   CL 104 10/16/2014 1049   CO2 28 12/09/2014 0826   CO2 27 10/16/2014 1049   BUN 8 12/09/2014 0826   BUN <5* 10/16/2014 1049   CREATININE 0.62 12/09/2014 0826   CREATININE 0.64 10/16/2014 1049   GLUCOSE 82 12/09/2014 0826   GLUCOSE 82 10/16/2014 1049   CALCIUM 9.0 12/09/2014 0826   CALCIUM 8.6 10/16/2014 1049   AST 26 12/09/2014 0826   AST 31 10/16/2014 1107   ALT 14 12/09/2014 0826   ALT 15 10/16/2014 1107   ALKPHOS 84 12/09/2014 0826   ALKPHOS 82 10/16/2014 1107   BILITOT 0.5 12/09/2014 0826   BILITOT 0.5 10/16/2014 1107   PROT 7.7 12/09/2014 0826   PROT 7.5 10/16/2014 1107   ALBUMIN 3.6 12/09/2014 0826   ALBUMIN 3.4* 10/16/2014 1107     Studies/Results: Abd 1 View (kub)  12/10/2014   CLINICAL DATA:  Abdominal pain  EXAM: ABDOMEN - 1 VIEW  COMPARISON:  12/09/2014 CT  abdomen/pelvis  FINDINGS: Again noted is an mildly dilated left upper quadrant abdominal loop of small bowel measuring 3.8 cm maximally. Residual contrast noted in nondilated colon. Presence or absence of air-fluid levels or free air is suboptimally evaluated on this supine projection. No new abnormal radiopacity. No acute osseous finding.  IMPRESSION: Single dilated gas-filled left mid abdominal small bowel loop which could indicate partial/early small bowel obstruction or focal ileus.   Electronically Signed   By: Conchita Paris M.D.   On: 12/10/2014 09:41   Ct Abdomen Pelvis W Contrast  12/09/2014   CLINICAL DATA:  Abdominal pain for 10 days. Prior history of bowel surgery.  EXAM: CT ABDOMEN AND PELVIS WITH CONTRAST  TECHNIQUE: Multidetector CT imaging of the abdomen and pelvis was performed using the standard protocol following bolus administration of intravenous contrast.  CONTRAST:  158m OMNIPAQUE IOHEXOL 300 MG/ML  SOLN  COMPARISON:  11/16/2014  FINDINGS: Lower chest: Curvilinear left lower lobe subpleural scarring stable.  Hepatobiliary: Nodular hepatic contour which may suggest cirrhosis is reidentified. Subcapsular hepatic calcification right lower lobe posterior segment image 33 reidentified. No focal hepatic mass is otherwise identified. Gallbladder is distended but unremarkable. No common duct or pancreatic ductal dilatation.  Pancreas: Normal  Spleen:  Sub cm too small to characterize splenic lesions are stable, most likely benign findings such as hemangiomas or lymphangiomas. An oval 3 cm mass at the superior aspect of the spleen measuring higher than expected for fluid attenuation image 16 is reidentified and stable since prior exam dating back to at least 11/16/2010, compatible with a benign finding such as hemangioma or lymphangioma.  Adrenals/Urinary Tract: Adrenal glands appear normal. Kidneys are normal. No hydroureteronephrosis. No radiopaque renal, ureteral, or bladder calculus.   Stomach/Bowel: Stomach appears unremarkable. Prominence of left upper quadrant and left mid abdominal loops of small bowel, predominantly proximal jejunum, is noted, with maximal diameter 3.5 cm image 35. No wall thickening is identified. There is gradual tapering of caliber to normal appearing mid to distal small bowel. Moderate stool burden.  Vascular/Lymphatic: Mild atheromatous aortic calcification noted without aneurysm. Subjectively prominent gastrohepatic and porta hepatis nodes are reidentified, with dominant 1.5 cm gastrohepatic node identified image 31, unchanged. Sub cm root of small bowel mesentery lymph nodes are reidentified.  Reproductive: Uterus presumed surgically absent. Neither ovary is visualized. No right adnexal mass. 0.8 cm ovoid left pelvic sidewall mass image 71 is reidentified which could represent ovarian remnant, lymph node, or other likely benign finding given stability over prior exams.  Other: Moderate ascites tracks to the pelvis. Overall, this is not significantly changed.  Musculoskeletal: Bilateral L5 pars interarticularis defects are noted. Lumbar spine degenerative change reidentified. No acute osseous abnormality.  IMPRESSION: No significant change in nodular hepatic contour suggesting cirrhosis with diffuse abdominal ascites and likely reactive porta hepatis and mesenteric borderline lymphadenopathy.  Mild dilatation of left upper quadrant loops of jejunum without wall thickening or other complicating feature. This may be a normal variant especially in this location, or could reflect focal ileus or early/ partial small bowel obstruction.   Electronically Signed   By: Conchita Paris M.D.   On: 12/09/2014 10:28    Anti-infectives: Anti-infectives    None      Assessment Principal Problem:   Generalized chronic abdominal pain-CT contrast in colon in less than 12 hours which is inconsistent with mechanical SBO; likely has a functional issue due to chronic narcotic  use.   Constipation by delayed colonic transit      Plan: No acute surgical issues.  Agree with Relistor.   Maria Gallicchio J 12/10/2014

## 2014-12-11 ENCOUNTER — Inpatient Hospital Stay (HOSPITAL_COMMUNITY): Payer: Medicare Other

## 2014-12-11 LAB — GLUCOSE, CAPILLARY
GLUCOSE-CAPILLARY: 92 mg/dL (ref 70–99)
Glucose-Capillary: 106 mg/dL — ABNORMAL HIGH (ref 70–99)
Glucose-Capillary: 89 mg/dL (ref 70–99)

## 2014-12-11 MED ORDER — LACTULOSE 10 G PO PACK: 10.0000 g | PACK | Freq: Two times a day (BID) | ORAL | Status: DC | PRN

## 2014-12-11 MED ORDER — MENTHOL 3 MG MT LOZG
1.0000 | LOZENGE | OROMUCOSAL | Status: DC | PRN
  Administered 2014-12-11: 3 mg via ORAL
  Filled 2014-12-11: qty 9

## 2014-12-11 MED ORDER — METFORMIN HCL 500 MG PO TABS: 1000.0000 mg | ORAL_TABLET | Freq: Two times a day (BID) | ORAL | Status: DC

## 2014-12-11 NOTE — Discharge Instructions (Signed)
Follow with Primary MD Shirline Frees, MD in 7 days   Get CBC, CMP, 2 view Chest X ray checked  by Primary MD next visit.    Activity: As tolerated with Full fall precautions use walker/cane & assistance as needed   Disposition Home     Diet: Heart Healthy Low Carb  For Heart failure patients - Check your Weight same time everyday, if you gain over 2 pounds, or you develop in leg swelling, experience more shortness of breath or chest pain, call your Primary MD immediately. Follow Cardiac Low Salt Diet and 1.5 lit/day fluid restriction.   On your next visit with your primary care physician please Get Medicines reviewed and adjusted.   Please request your Prim.MD to go over all Hospital Tests and Procedure/Radiological results at the follow up, please get all Hospital records sent to your Prim MD by signing hospital release before you go home.   If you experience worsening of your admission symptoms, develop shortness of breath, life threatening emergency, suicidal or homicidal thoughts you must seek medical attention immediately by calling 911 or calling your MD immediately  if symptoms less severe.  You Must read complete instructions/literature along with all the possible adverse reactions/side effects for all the Medicines you take and that have been prescribed to you. Take any new Medicines after you have completely understood and accpet all the possible adverse reactions/side effects.   Do not drive, operating heavy machinery, perform activities at heights, swimming or participation in water activities or provide baby sitting services if your were admitted for syncope or siezures until you have seen by Primary MD or a Neurologist and advised to do so again.  Do not drive when taking Pain medications.    Do not take more than prescribed Pain, Sleep and Anxiety Medications  Special Instructions: If you have smoked or chewed Tobacco  in the last 2 yrs please stop smoking, stop any  regular Alcohol  and or any Recreational drug use.  Wear Seat belts while driving.   Please note  You were cared for by a hospitalist during your hospital stay. If you have any questions about your discharge medications or the care you received while you were in the hospital after you are discharged, you can call the unit and asked to speak with the hospitalist on call if the hospitalist that took care of you is not available. Once you are discharged, your primary care physician will handle any further medical issues. Please note that NO REFILLS for any discharge medications will be authorized once you are discharged, as it is imperative that you return to your primary care physician (or establish a relationship with a primary care physician if you do not have one) for your aftercare needs so that they can reassess your need for medications and monitor your lab values.      Metformin and X-ray Contrast Studies For some X-ray exams, a contrast dye is used. Contrast dye is a type of medicine used to make the X-ray image clearer. The contrast dye is given to the patient through a vein (intravenously). If you need to have this type of X-ray exam and you take a medication called metformin, your caregiver may have you stop taking metformin before the exam.  LACTIC ACIDOSIS In rare cases, a serious medical condition called lactic acidosis can develop in people who take metformin and receive contrast dye. The following conditions can increase the risk of this complication:   Kidney failure.  Liver  problems.  Certain types of heart problems such as:  Heart failure.  Heart attack.  Heart infection.  Heart valve problems.  Alcohol abuse. If left untreated, lactic acidosis can lead to coma.  SYMPTOMS OF LACTIC ACIDOSIS Symptoms of lactic acidosis can include:  Rapid breathing (hyperventilation).  Neurologic symptoms such as:  Headaches.  Confusion.  Dizziness.  Excessive  sweating.  Feeling sick to your stomach (nauseous) or throwing up (vomiting). AFTER THE X-RAY EXAM  Stay well-hydrated. Drink fluids as instructed by your caregiver.  If you have a risk of developing lactic acidosis, blood tests may be done to make sure your kidney function is okay.  Metformin is usually stopped for 48 hours after the X-ray exam. Ask your caregiver when you can start taking metformin again. SEEK MEDICAL CARE IF:   You have shortness of breath or difficulty breathing.  You develop a headache that does not go away.  You have nausea or vomiting.  You urinate more than normal.  You develop a skin rash and have:  Redness.  Swelling.  Itching. Document Released: 08/20/2009 Document Revised: 11/24/2011 Document Reviewed: 08/20/2009 [facility] Patient Information 2015 [place], [place]. This information is not intended to replace advice given to you by your health care provider. Make sure you discuss any questions you have with your health care provider.
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
## 2014-12-11 NOTE — Progress Notes (Signed)
Patient discharge teaching given, including activity, diet, follow-up appoints, and medications. Patient verbalized understanding of all discharge instructions. IV access was d/c'd. Vitals are stable. Skin is intact except as charted in most recent assessments. Pt to be escorted out by NT, to be driven home by family. 

## 2014-12-19 DIAGNOSIS — I10 Essential (primary) hypertension: Secondary | ICD-10-CM | POA: Diagnosis not present

## 2014-12-19 DIAGNOSIS — G43909 Migraine, unspecified, not intractable, without status migrainosus: Secondary | ICD-10-CM | POA: Diagnosis not present

## 2014-12-19 DIAGNOSIS — K5900 Constipation, unspecified: Secondary | ICD-10-CM | POA: Diagnosis not present

## 2014-12-19 DIAGNOSIS — E119 Type 2 diabetes mellitus without complications: Secondary | ICD-10-CM | POA: Diagnosis not present

## 2014-12-28 DIAGNOSIS — K219 Gastro-esophageal reflux disease without esophagitis: Secondary | ICD-10-CM | POA: Diagnosis not present

## 2014-12-28 DIAGNOSIS — K7581 Nonalcoholic steatohepatitis (NASH): Secondary | ICD-10-CM | POA: Diagnosis not present

## 2014-12-28 DIAGNOSIS — K7460 Unspecified cirrhosis of liver: Secondary | ICD-10-CM | POA: Diagnosis not present

## 2014-12-28 DIAGNOSIS — R1084 Generalized abdominal pain: Secondary | ICD-10-CM | POA: Diagnosis not present

## 2014-12-28 DIAGNOSIS — G8929 Other chronic pain: Secondary | ICD-10-CM | POA: Diagnosis not present

## 2015-01-09 DIAGNOSIS — M5136 Other intervertebral disc degeneration, lumbar region: Secondary | ICD-10-CM | POA: Diagnosis not present

## 2015-01-09 DIAGNOSIS — G894 Chronic pain syndrome: Secondary | ICD-10-CM | POA: Diagnosis not present

## 2015-01-09 DIAGNOSIS — Z79891 Long term (current) use of opiate analgesic: Secondary | ICD-10-CM | POA: Diagnosis not present

## 2015-02-06 ENCOUNTER — Other Ambulatory Visit: Payer: Self-pay

## 2015-02-06 DIAGNOSIS — M5136 Other intervertebral disc degeneration, lumbar region: Secondary | ICD-10-CM | POA: Diagnosis not present

## 2015-02-06 DIAGNOSIS — Z1231 Encounter for screening mammogram for malignant neoplasm of breast: Secondary | ICD-10-CM

## 2015-03-05 ENCOUNTER — Ambulatory Visit
Admission: RE | Admit: 2015-03-05 | Discharge: 2015-03-05 | Disposition: A | Discharge status: Home / Self Care | Payer: Medicare Other | Source: Ambulatory Visit

## 2015-03-05 DIAGNOSIS — Z1231 Encounter for screening mammogram for malignant neoplasm of breast: Secondary | ICD-10-CM

## 2015-03-08 ENCOUNTER — Emergency Department (HOSPITAL_COMMUNITY): Payer: Medicare Other

## 2015-03-08 ENCOUNTER — Encounter (HOSPITAL_COMMUNITY): Payer: Self-pay | Admitting: Emergency Medicine

## 2015-03-08 ENCOUNTER — Emergency Department (HOSPITAL_COMMUNITY)
Admission: EM | Admit: 2015-03-08 | Discharge: 2015-03-09 | Disposition: A | Discharge status: Home / Self Care | Payer: Medicare Other | Attending: Emergency Medicine | Admitting: Emergency Medicine

## 2015-03-08 DIAGNOSIS — F329 Major depressive disorder, single episode, unspecified: Secondary | ICD-10-CM | POA: Diagnosis not present

## 2015-03-08 DIAGNOSIS — Z86711 Personal history of pulmonary embolism: Secondary | ICD-10-CM | POA: Diagnosis not present

## 2015-03-08 DIAGNOSIS — R110 Nausea: Secondary | ICD-10-CM | POA: Diagnosis not present

## 2015-03-08 DIAGNOSIS — I10 Essential (primary) hypertension: Secondary | ICD-10-CM | POA: Insufficient documentation

## 2015-03-08 DIAGNOSIS — R519 Headache, unspecified: Secondary | ICD-10-CM

## 2015-03-08 DIAGNOSIS — Z720 Tobacco use: Secondary | ICD-10-CM | POA: Insufficient documentation

## 2015-03-08 DIAGNOSIS — Z880 Allergy status to penicillin: Secondary | ICD-10-CM | POA: Diagnosis not present

## 2015-03-08 DIAGNOSIS — R51 Headache: Secondary | ICD-10-CM | POA: Insufficient documentation

## 2015-03-08 DIAGNOSIS — E119 Type 2 diabetes mellitus without complications: Secondary | ICD-10-CM | POA: Diagnosis not present

## 2015-03-08 DIAGNOSIS — J441 Chronic obstructive pulmonary disease with (acute) exacerbation: Secondary | ICD-10-CM | POA: Insufficient documentation

## 2015-03-08 DIAGNOSIS — R1010 Upper abdominal pain, unspecified: Secondary | ICD-10-CM | POA: Diagnosis not present

## 2015-03-08 DIAGNOSIS — I2510 Atherosclerotic heart disease of native coronary artery without angina pectoris: Secondary | ICD-10-CM | POA: Diagnosis not present

## 2015-03-08 DIAGNOSIS — R1011 Right upper quadrant pain: Secondary | ICD-10-CM | POA: Diagnosis not present

## 2015-03-08 DIAGNOSIS — R079 Chest pain, unspecified: Secondary | ICD-10-CM | POA: Diagnosis not present

## 2015-03-08 DIAGNOSIS — J449 Chronic obstructive pulmonary disease, unspecified: Secondary | ICD-10-CM | POA: Diagnosis not present

## 2015-03-08 DIAGNOSIS — R1084 Generalized abdominal pain: Secondary | ICD-10-CM | POA: Diagnosis not present

## 2015-03-08 DIAGNOSIS — Z79899 Other long term (current) drug therapy: Secondary | ICD-10-CM | POA: Insufficient documentation

## 2015-03-08 DIAGNOSIS — R109 Unspecified abdominal pain: Secondary | ICD-10-CM

## 2015-03-08 DIAGNOSIS — R0602 Shortness of breath: Secondary | ICD-10-CM | POA: Insufficient documentation

## 2015-03-08 DIAGNOSIS — K219 Gastro-esophageal reflux disease without esophagitis: Secondary | ICD-10-CM | POA: Insufficient documentation

## 2015-03-08 DIAGNOSIS — G8929 Other chronic pain: Secondary | ICD-10-CM | POA: Diagnosis not present

## 2015-03-08 DIAGNOSIS — M1990 Unspecified osteoarthritis, unspecified site: Secondary | ICD-10-CM | POA: Diagnosis not present

## 2015-03-08 DIAGNOSIS — E669 Obesity, unspecified: Secondary | ICD-10-CM | POA: Diagnosis not present

## 2015-03-08 DIAGNOSIS — E780 Pure hypercholesterolemia: Secondary | ICD-10-CM | POA: Diagnosis not present

## 2015-03-08 LAB — I-STAT TROPONIN, ED: Troponin i, poc: 0 ng/mL (ref 0.00–0.08)

## 2015-03-08 LAB — COMPREHENSIVE METABOLIC PANEL
ALT: 14 U/L (ref 14–54)
ANION GAP: 11 (ref 5–15)
AST: 23 U/L (ref 15–41)
Albumin: 3.7 g/dL (ref 3.5–5.0)
Alkaline Phosphatase: 87 U/L (ref 38–126)
BUN: 8 mg/dL (ref 6–20)
CO2: 27 mmol/L (ref 22–32)
Calcium: 9.4 mg/dL (ref 8.9–10.3)
Chloride: 100 mmol/L — ABNORMAL LOW (ref 101–111)
Creatinine, Ser: 0.67 mg/dL (ref 0.44–1.00)
GFR calc non Af Amer: >60 mL/min (ref >60)
Glucose, Bld: 82 mg/dL (ref 65–99)
Potassium: 4.2 mmol/L (ref 3.5–5.1)
Sodium: 138 mmol/L (ref 135–145)
TOTAL PROTEIN: 7.9 g/dL (ref 6.5–8.1)
Total Bilirubin: 0.6 mg/dL (ref 0.3–1.2)

## 2015-03-08 LAB — CBC WITH DIFFERENTIAL/PLATELET
Basophils Absolute: 0 10*3/uL (ref 0.0–0.1)
Basophils Relative: 0 % (ref 0–1)
Eosinophils Absolute: 0.2 10*3/uL (ref 0.0–0.7)
Eosinophils Relative: 2 % (ref 0–5)
HEMATOCRIT: 42.3 % (ref 36.0–46.0)
Hemoglobin: 14.1 g/dL (ref 12.0–15.0)
LYMPHS PCT: 33 % (ref 12–46)
Lymphs Abs: 3.2 10*3/uL (ref 0.7–4.0)
MCH: 27.8 pg (ref 26.0–34.0)
MCHC: 33.3 g/dL (ref 30.0–36.0)
MCV: 83.3 fL (ref 78.0–100.0)
Monocytes Absolute: 0.3 10*3/uL (ref 0.1–1.0)
Monocytes Relative: 3 % (ref 3–12)
NEUTROS ABS: 5.9 10*3/uL (ref 1.7–7.7)
Neutrophils Relative %: 62 % (ref 43–77)
PLATELETS: 169 10*3/uL (ref 150–400)
RBC: 5.08 MIL/uL (ref 3.87–5.11)
RDW: 14.6 % (ref 11.5–15.5)
WBC: 9.5 10*3/uL (ref 4.0–10.5)

## 2015-03-08 LAB — CBG MONITORING, ED: GLUCOSE-CAPILLARY: 82 mg/dL (ref 65–99)

## 2015-03-08 LAB — LIPASE, BLOOD: Lipase: 30 U/L (ref 22–51)

## 2015-03-08 MED ORDER — PROMETHAZINE HCL 25 MG/ML IJ SOLN
25.0000 mg | Freq: Once | INTRAMUSCULAR | Status: AC
  Administered 2015-03-08: 25 mg via INTRAMUSCULAR
  Filled 2015-03-08: qty 1

## 2015-03-08 MED ORDER — KETOROLAC TROMETHAMINE 30 MG/ML IJ SOLN
30.0000 mg | Freq: Once | INTRAMUSCULAR | Status: AC
Start: 2015-03-09 — End: 2015-03-09
  Administered 2015-03-09: 30 mg via INTRAMUSCULAR
  Filled 2015-03-08: qty 1

## 2015-03-08 MED ORDER — PROMETHAZINE HCL 25 MG/ML IJ SOLN
25.0000 mg | Freq: Once | INTRAMUSCULAR | Status: DC
  Filled 2015-03-08: qty 1

## 2015-03-08 MED ORDER — KETOROLAC TROMETHAMINE 30 MG/ML IJ SOLN: 30.0000 mg | Freq: Once | INTRAMUSCULAR | Status: DC
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
## 2015-03-08 NOTE — ED Notes (Signed)
Spoke to Dr. Alvino Chapel in regards to patient's request for medication for headache and stomach pain. md acknowledges, no new orders. Currently awaiting for phernergan to arrive.

## 2015-03-08 NOTE — ED Notes (Signed)
MD notified of CBG 82. Gave pt 2 cups juice.

## 2015-03-08 NOTE — ED Notes (Signed)
Pt stated her head was "killing" her, and that she was concerned about her diabetes. Nurse notified.

## 2015-03-08 NOTE — ED Notes (Signed)
Pt sts upper abd pain and pain with inspiration x 2 days

## 2015-03-09 NOTE — Discharge Instructions (Signed)
Abdominal Pain Many things can cause abdominal pain. Usually, abdominal pain is not caused by a disease and will improve without treatment. It can often be observed and treated at home. Your health care provider will do a physical exam and possibly order blood tests and X-rays to help determine the seriousness of your pain. However, in many cases, more time must pass before a clear cause of the pain can be found. Before that point, your health care provider may not know if you need more testing or further treatment. HOME CARE INSTRUCTIONS  Monitor your abdominal pain for any changes. The following actions may help to alleviate any discomfort you are experiencing:  Only take over-the-counter or prescription medicines as directed by your health care provider.  Do not take laxatives unless directed to do so by your health care provider.  Try a clear liquid diet (broth, tea, or water) as directed by your health care provider. Slowly move to a bland diet as tolerated. SEEK MEDICAL CARE IF:  You have unexplained abdominal pain.  You have abdominal pain associated with nausea or diarrhea.  You have pain when you urinate or have a bowel movement.  You experience abdominal pain that wakes you in the night.  You have abdominal pain that is worsened or improved by eating food.  You have abdominal pain that is worsened with eating fatty foods.  You have a fever. SEEK IMMEDIATE MEDICAL CARE IF:   Your pain does not go away within 2 hours.  You keep throwing up (vomiting).  Your pain is felt only in portions of the abdomen, such as the right side or the left lower portion of the abdomen.  You pass bloody or black tarry stools. MAKE SURE YOU:  Understand these instructions.   Will watch your condition.   Will get help right away if you are not doing well or get worse.  Document Released: 06/11/2005 Document Revised: 09/06/2013 Document Reviewed: 05/11/2013 [facility] Patient Information  2015 [place], [place]. This information is not intended to replace advice given to you by your health care provider. Make sure you discuss any questions you have with your health care provider.  General Headache Without Cause A headache is pain or discomfort felt around the head or neck area. The specific cause of a headache may not be found. There are many causes and types of headaches. A few common ones are:  Tension headaches.  Migraine headaches.  Cluster headaches.  Chronic daily headaches. HOME CARE INSTRUCTIONS   Keep all follow-up appointments with your caregiver or any specialist referral.  Only take over-the-counter or prescription medicines for pain or discomfort as directed by your caregiver.  Lie down in a dark, quiet room when you have a headache.  Keep a headache journal to find out what may trigger your migraine headaches. For example, write down:  What you eat and drink.  How much sleep you get.  Any change to your diet or medicines.  Try massage or other relaxation techniques.  Put ice packs or heat on the head and neck. Use these 3 to 4 times per day for 15 to 20 minutes each time, or as needed.  Limit stress.  Sit up straight, and do not tense your muscles.  Quit smoking if you smoke.  Limit alcohol use.  Decrease the amount of caffeine you drink, or stop drinking caffeine.  Eat and sleep on a regular schedule.  Get 7 to 9 hours of sleep, or as recommended by your caregiver.  Keep lights dim if bright lights bother you and make your headaches worse. SEEK MEDICAL CARE IF:   You have problems with the medicines you were prescribed.  Your medicines are not working.  You have a change from the usual headache.  You have nausea or vomiting. SEEK IMMEDIATE MEDICAL CARE IF:   Your headache becomes severe.  You have a fever.  You have a stiff neck.  You have loss of vision.  You have muscular weakness or loss of muscle control.  You start  losing your balance or have trouble walking.  You feel faint or pass out.  You have severe symptoms that are different from your first symptoms. MAKE SURE YOU:   Understand these instructions.  Will watch your condition.  Will get help right away if you are not doing well or get worse. Document Released: 09/01/2005 Document Revised: 11/24/2011 Document Reviewed: 09/17/2011 [facility] Patient Information 2015 [place], [place]. This information is not intended to replace advice given to you by your health care provider. Make sure you discuss any questions you have with your health care provider.

## 2015-03-30 DIAGNOSIS — J449 Chronic obstructive pulmonary disease, unspecified: Secondary | ICD-10-CM | POA: Diagnosis not present

## 2015-03-30 DIAGNOSIS — F17200 Nicotine dependence, unspecified, uncomplicated: Secondary | ICD-10-CM | POA: Diagnosis not present

## 2015-03-30 DIAGNOSIS — E119 Type 2 diabetes mellitus without complications: Secondary | ICD-10-CM | POA: Diagnosis not present

## 2015-03-30 DIAGNOSIS — M549 Dorsalgia, unspecified: Secondary | ICD-10-CM | POA: Diagnosis not present

## 2015-03-30 DIAGNOSIS — E785 Hyperlipidemia, unspecified: Secondary | ICD-10-CM | POA: Diagnosis not present

## 2015-03-30 DIAGNOSIS — F419 Anxiety disorder, unspecified: Secondary | ICD-10-CM | POA: Diagnosis not present

## 2015-03-30 DIAGNOSIS — R109 Unspecified abdominal pain: Secondary | ICD-10-CM | POA: Diagnosis not present

## 2015-03-30 DIAGNOSIS — I10 Essential (primary) hypertension: Secondary | ICD-10-CM | POA: Diagnosis not present

## 2015-03-30 DIAGNOSIS — K5900 Constipation, unspecified: Secondary | ICD-10-CM | POA: Diagnosis not present

## 2015-04-04 DIAGNOSIS — M5136 Other intervertebral disc degeneration, lumbar region: Secondary | ICD-10-CM | POA: Diagnosis not present

## 2015-06-28 DIAGNOSIS — Z79891 Long term (current) use of opiate analgesic: Secondary | ICD-10-CM | POA: Diagnosis not present

## 2015-06-28 DIAGNOSIS — M5136 Other intervertebral disc degeneration, lumbar region: Secondary | ICD-10-CM | POA: Diagnosis not present

## 2015-06-28 DIAGNOSIS — G894 Chronic pain syndrome: Secondary | ICD-10-CM | POA: Diagnosis not present

## 2015-06-28 DIAGNOSIS — Z9889 Other specified postprocedural states: Secondary | ICD-10-CM | POA: Diagnosis not present

## 2015-08-14 ENCOUNTER — Emergency Department (HOSPITAL_COMMUNITY)
Admission: EM | Admit: 2015-08-14 | Discharge: 2015-08-14 | Disposition: A | Discharge status: Home / Self Care | Payer: Medicare Other | Attending: Emergency Medicine | Admitting: Emergency Medicine

## 2015-08-14 ENCOUNTER — Encounter (HOSPITAL_COMMUNITY): Payer: Self-pay

## 2015-08-14 DIAGNOSIS — E119 Type 2 diabetes mellitus without complications: Secondary | ICD-10-CM | POA: Insufficient documentation

## 2015-08-14 DIAGNOSIS — R1011 Right upper quadrant pain: Secondary | ICD-10-CM | POA: Diagnosis not present

## 2015-08-14 DIAGNOSIS — I10 Essential (primary) hypertension: Secondary | ICD-10-CM | POA: Diagnosis not present

## 2015-08-14 DIAGNOSIS — G8929 Other chronic pain: Secondary | ICD-10-CM | POA: Insufficient documentation

## 2015-08-14 DIAGNOSIS — Z79899 Other long term (current) drug therapy: Secondary | ICD-10-CM | POA: Diagnosis not present

## 2015-08-14 DIAGNOSIS — E7800 Pure hypercholesterolemia, unspecified: Secondary | ICD-10-CM | POA: Insufficient documentation

## 2015-08-14 DIAGNOSIS — I2510 Atherosclerotic heart disease of native coronary artery without angina pectoris: Secondary | ICD-10-CM | POA: Diagnosis not present

## 2015-08-14 DIAGNOSIS — Z86711 Personal history of pulmonary embolism: Secondary | ICD-10-CM | POA: Insufficient documentation

## 2015-08-14 DIAGNOSIS — R12 Heartburn: Secondary | ICD-10-CM | POA: Insufficient documentation

## 2015-08-14 DIAGNOSIS — J45901 Unspecified asthma with (acute) exacerbation: Secondary | ICD-10-CM | POA: Insufficient documentation

## 2015-08-14 DIAGNOSIS — Z880 Allergy status to penicillin: Secondary | ICD-10-CM | POA: Diagnosis not present

## 2015-08-14 DIAGNOSIS — R1012 Left upper quadrant pain: Secondary | ICD-10-CM | POA: Insufficient documentation

## 2015-08-14 DIAGNOSIS — F17210 Nicotine dependence, cigarettes, uncomplicated: Secondary | ICD-10-CM | POA: Diagnosis not present

## 2015-08-14 DIAGNOSIS — Z7984 Long term (current) use of oral hypoglycemic drugs: Secondary | ICD-10-CM | POA: Diagnosis not present

## 2015-08-14 DIAGNOSIS — F329 Major depressive disorder, single episode, unspecified: Secondary | ICD-10-CM | POA: Diagnosis not present

## 2015-08-14 DIAGNOSIS — K219 Gastro-esophageal reflux disease without esophagitis: Secondary | ICD-10-CM | POA: Insufficient documentation

## 2015-08-14 DIAGNOSIS — Z7952 Long term (current) use of systemic steroids: Secondary | ICD-10-CM | POA: Insufficient documentation

## 2015-08-14 DIAGNOSIS — J449 Chronic obstructive pulmonary disease, unspecified: Secondary | ICD-10-CM | POA: Insufficient documentation

## 2015-08-14 DIAGNOSIS — E669 Obesity, unspecified: Secondary | ICD-10-CM | POA: Insufficient documentation

## 2015-08-14 DIAGNOSIS — Z8739 Personal history of other diseases of the musculoskeletal system and connective tissue: Secondary | ICD-10-CM | POA: Diagnosis not present

## 2015-08-14 LAB — COMPREHENSIVE METABOLIC PANEL
ALT: 14 U/L (ref 14–54)
ANION GAP: 6 (ref 5–15)
AST: 29 U/L (ref 15–41)
Albumin: 3.2 g/dL — ABNORMAL LOW (ref 3.5–5.0)
Alkaline Phosphatase: 87 U/L (ref 38–126)
BILIRUBIN TOTAL: 0.6 mg/dL (ref 0.3–1.2)
BUN: 8 mg/dL (ref 6–20)
CHLORIDE: 102 mmol/L (ref 101–111)
CO2: 31 mmol/L (ref 22–32)
Calcium: 8.7 mg/dL — ABNORMAL LOW (ref 8.9–10.3)
Creatinine, Ser: 0.64 mg/dL (ref 0.44–1.00)
GFR calc Af Amer: >60 mL/min (ref >60)
GFR calc non Af Amer: >60 mL/min (ref >60)
GLUCOSE: 100 mg/dL — AB (ref 65–99)
POTASSIUM: 4.2 mmol/L (ref 3.5–5.1)
Sodium: 139 mmol/L (ref 135–145)
TOTAL PROTEIN: 7.4 g/dL (ref 6.5–8.1)

## 2015-08-14 LAB — URINALYSIS, ROUTINE W REFLEX MICROSCOPIC
Bilirubin Urine: NEGATIVE
Glucose, UA: NEGATIVE mg/dL
HGB URINE DIPSTICK: NEGATIVE
KETONES UR: NEGATIVE mg/dL
LEUKOCYTES UA: NEGATIVE
Nitrite: NEGATIVE
PROTEIN: NEGATIVE mg/dL
Specific Gravity, Urine: 1.009 (ref 1.005–1.030)
pH: 5.5 (ref 5.0–8.0)

## 2015-08-14 LAB — CBC WITH DIFFERENTIAL/PLATELET
Basophils Absolute: 0 10*3/uL (ref 0.0–0.1)
Basophils Relative: 0 %
Eosinophils Absolute: 0.2 10*3/uL (ref 0.0–0.7)
Eosinophils Relative: 3 %
HCT: 38.4 % (ref 36.0–46.0)
Hemoglobin: 12.2 g/dL (ref 12.0–15.0)
LYMPHS ABS: 2.7 10*3/uL (ref 0.7–4.0)
LYMPHS PCT: 36 %
MCH: 27.7 pg (ref 26.0–34.0)
MCHC: 31.8 g/dL (ref 30.0–36.0)
MCV: 87.1 fL (ref 78.0–100.0)
MONO ABS: 0.3 10*3/uL (ref 0.1–1.0)
MONOS PCT: 5 %
NEUTROS ABS: 4.1 10*3/uL (ref 1.7–7.7)
Neutrophils Relative %: 56 %
Platelets: 156 10*3/uL (ref 150–400)
RBC: 4.41 MIL/uL (ref 3.87–5.11)
RDW: 14.1 % (ref 11.5–15.5)
WBC: 7.4 10*3/uL (ref 4.0–10.5)

## 2015-08-14 LAB — LIPASE, BLOOD: LIPASE: 35 U/L (ref 11–51)

## 2015-08-14 LAB — POC OCCULT BLOOD, ED: FECAL OCCULT BLD: POSITIVE — AB

## 2015-08-14 LAB — I-STAT CG4 LACTIC ACID, ED: Lactic Acid, Venous: 1.66 mmol/L (ref 0.5–2.0)

## 2015-08-14 MED ORDER — ALUM & MAG HYDROXIDE-SIMETH 200-200-20 MG/5ML PO SUSP: 10.0000 mL | Freq: Four times a day (QID) | ORAL | Status: DC

## 2015-08-14 MED ORDER — PANTOPRAZOLE SODIUM 40 MG IV SOLR
40.0000 mg | Freq: Once | INTRAVENOUS | Status: AC
  Administered 2015-08-14: 40 mg via INTRAVENOUS
  Filled 2015-08-14: qty 40

## 2015-08-14 MED ORDER — SODIUM CHLORIDE 0.9 % IV BOLUS (SEPSIS)
1000.0000 mL | Freq: Once | INTRAVENOUS | Status: AC
  Administered 2015-08-14: 1000 mL via INTRAVENOUS

## 2015-08-14 MED ORDER — GI COCKTAIL ~~LOC~~
30.0000 mL | Freq: Once | ORAL | Status: AC
  Administered 2015-08-14: 30 mL via ORAL
  Filled 2015-08-14: qty 30

## 2015-08-14 MED ORDER — ONDANSETRON HCL 4 MG/2ML IJ SOLN
4.0000 mg | Freq: Once | INTRAMUSCULAR | Status: AC
  Administered 2015-08-14: 4 mg via INTRAVENOUS
  Filled 2015-08-14: qty 2

## 2015-08-14 NOTE — ED Notes (Signed)
Pt reports heartburn pain x3 weeks with no relief with home medications.  Pt denies vomiting but reports nausea.

## 2015-08-14 NOTE — Discharge Instructions (Signed)
Gastroesophageal Reflux Disease, Adult Normally, food travels down the esophagus and stays in the stomach to be digested. However, when a person has gastroesophageal reflux disease (GERD), food and stomach acid move back up into the esophagus. When this happens, the esophagus becomes sore and inflamed. Over time, GERD can create small holes (ulcers) in the lining of the esophagus.  CAUSES This condition is caused by a problem with the muscle between the esophagus and the stomach (lower esophageal sphincter, or LES). Normally, the LES muscle closes after food passes through the esophagus to the stomach. When the LES is weakened or abnormal, it does not close properly, and that allows food and stomach acid to go back up into the esophagus. The LES can be weakened by certain dietary substances, medicines, and medical conditions, including:  Tobacco use.  Pregnancy.  Having a hiatal hernia.  Heavy alcohol use.  Certain foods and beverages, such as coffee, chocolate, onions, and peppermint. RISK FACTORS This condition is more likely to develop in:  People who have an increased body weight.  People who have connective tissue disorders.  People who use NSAID medicines. SYMPTOMS Symptoms of this condition include:  Heartburn.  Difficult or painful swallowing.  The feeling of having a lump in the throat.  Abitter taste in the mouth.  Bad breath.  Having a large amount of saliva.  Having an upset or bloated stomach.  Belching.  Chest pain.  Shortness of breath or wheezing.  Ongoing (chronic) cough or a night-time cough.  Wearing away of tooth enamel.  Weight loss. Different conditions can cause chest pain. Make sure to see your health care provider if you experience chest pain. DIAGNOSIS Your health care provider will take a medical history and perform a physical exam. To determine if you have mild or severe GERD, your health care provider may also monitor how you respond  to treatment. You may also have other tests, including:  An endoscopy toexamine your stomach and esophagus with a small camera.  A test thatmeasures the acidity level in your esophagus.  A test thatmeasures how much pressure is on your esophagus.  A barium swallow or modified barium swallow to show the shape, size, and functioning of your esophagus. TREATMENT The goal of treatment is to help relieve your symptoms and to prevent complications. Treatment for this condition may vary depending on how severe your symptoms are. Your health care provider may recommend:  Changes to your diet.  Medicine.  Surgery. HOME CARE INSTRUCTIONS Diet  Follow a diet as recommended by your health care provider. This may involve avoiding foods and drinks such as:  Coffee and tea (with or without caffeine).  Drinks that containalcohol.  Energy drinks and sports drinks.  Carbonated drinks or sodas.  Chocolate and cocoa.  Peppermint and mint flavorings.  Garlic and onions.  Horseradish.  Spicy and acidic foods, including peppers, chili powder, curry powder, vinegar, hot sauces, and barbecue sauce.  Citrus fruit juices and citrus fruits, such as oranges, lemons, and limes.  Tomato-based foods, such as red sauce, chili, salsa, and pizza with red sauce.  Fried and fatty foods, such as donuts, french fries, potato chips, and high-fat dressings.  High-fat meats, such as hot dogs and fatty cuts of red and white meats, such as rib eye steak, sausage, ham, and bacon.  High-fat dairy items, such as whole milk, butter, and cream cheese.  Eat small, frequent meals instead of large meals.  Avoid drinking large amounts of liquid with your  meals.  Avoid eating meals during the 2-3 hours before bedtime.  Avoid lying down right after you eat.  Do not exercise right after you eat. General Instructions  Pay attention to any changes in your symptoms.  Take over-the-counter and prescription  medicines only as told by your health care provider. Do not take aspirin, ibuprofen, or other NSAIDs unless your health care provider told you to do so.  Do not use any tobacco products, including cigarettes, chewing tobacco, and e-cigarettes. If you need help quitting, ask your health care provider.  Wear loose-fitting clothing. Do not wear anything tight around your waist that causes pressure on your abdomen.  Raise (elevate) the head of your bed 6 inches (15cm).  Try to reduce your stress, such as with yoga or meditation. If you need help reducing stress, ask your health care provider.  If you are overweight, reduce your weight to an amount that is healthy for you. Ask your health care provider for guidance about a safe weight loss goal.  Keep all follow-up visits as told by your health care provider. This is important. SEEK MEDICAL CARE IF:  You have new symptoms.  You have unexplained weight loss.  You have difficulty swallowing, or it hurts to swallow.  You have wheezing or a persistent cough.  Your symptoms do not improve with treatment.  You have a hoarse voice. SEEK IMMEDIATE MEDICAL CARE IF:  You have pain in your arms, neck, jaw, teeth, or back.  You feel sweaty, dizzy, or light-headed.  You have chest pain or shortness of breath.  You vomit and your vomit looks like blood or coffee grounds.  You faint.  Your stool is bloody or black.  You cannot swallow, drink, or eat.   This information is not intended to replace advice given to you by your health care provider. Make sure you discuss any questions you have with your health care provider.   Document Released: 06/11/2005 Document Revised: 05/23/2015 Document Reviewed: 12/27/2014 Elsevier Interactive Patient Education 2016 [place] for Gastroesophageal Reflux Disease, Adult When you have gastroesophageal reflux disease (GERD), the foods you eat and your eating habits are very important.  Choosing the right foods can help ease the discomfort of GERD. WHAT GENERAL GUIDELINES DO I NEED TO FOLLOW?  Choose fruits, vegetables, whole grains, low-fat dairy products, and low-fat meat, fish, and poultry.  Limit fats such as oils, salad dressings, butter, nuts, and avocado.  Keep a food diary to identify foods that cause symptoms.  Avoid foods that cause reflux. These may be different for different people.  Eat frequent small meals instead of three large meals each day.  Eat your meals slowly, in a relaxed setting.  Limit fried foods.  Cook foods using methods other than frying.  Avoid drinking alcohol.  Avoid drinking large amounts of liquids with your meals.  Avoid bending over or lying down until 2-3 hours after eating. WHAT FOODS ARE NOT RECOMMENDED? The following are some foods and drinks that may worsen your symptoms: Vegetables Tomatoes. Tomato juice. Tomato and spaghetti sauce. Chili peppers. Onion and garlic. Horseradish. Fruits Oranges, grapefruit, and lemon (fruit and juice). Meats High-fat meats, fish, and poultry. This includes hot dogs, ribs, ham, sausage, salami, and bacon. Dairy Whole milk and chocolate milk. Sour cream. Cream. Butter. Ice cream. Cream cheese.  Beverages Coffee and tea, with or without caffeine. Carbonated beverages or energy drinks. Condiments Hot sauce. Barbecue sauce.  Sweets/Desserts Chocolate and cocoa. Donuts. Peppermint and  Fats and Oils High-fat foods, including French fries and potato chips. Other Vinegar. Strong spices, such as black pepper, white pepper, red pepper, cayenne, curry powder, cloves, ginger, and chili powder. The items listed above may not be a complete list of foods and beverages to avoid. Contact your dietitian for more information.   This information is not intended to replace advice given to you by your health care provider. Make sure you discuss any questions you have with your health care  provider.   Document Released: 09/01/2005 Document Revised: 09/22/2014 Document Reviewed: 07/06/2013 Elsevier Interactive Patient Education 2016 [employer]  
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
## 2015-08-16 DIAGNOSIS — Z7984 Long term (current) use of oral hypoglycemic drugs: Secondary | ICD-10-CM | POA: Diagnosis not present

## 2015-08-16 DIAGNOSIS — K219 Gastro-esophageal reflux disease without esophagitis: Secondary | ICD-10-CM | POA: Diagnosis not present

## 2015-08-16 DIAGNOSIS — E119 Type 2 diabetes mellitus without complications: Secondary | ICD-10-CM | POA: Diagnosis not present

## 2015-08-16 DIAGNOSIS — J449 Chronic obstructive pulmonary disease, unspecified: Secondary | ICD-10-CM | POA: Diagnosis not present

## 2015-08-16 DIAGNOSIS — R1013 Epigastric pain: Secondary | ICD-10-CM | POA: Diagnosis not present

## 2015-08-16 DIAGNOSIS — K7581 Nonalcoholic steatohepatitis (NASH): Secondary | ICD-10-CM | POA: Diagnosis not present

## 2015-08-16 DIAGNOSIS — R1084 Generalized abdominal pain: Secondary | ICD-10-CM | POA: Diagnosis not present

## 2015-08-17 ENCOUNTER — Other Ambulatory Visit: Payer: Self-pay | Admitting: Gastroenterology

## 2015-08-17 DIAGNOSIS — K2950 Unspecified chronic gastritis without bleeding: Secondary | ICD-10-CM | POA: Diagnosis not present

## 2015-08-17 DIAGNOSIS — R1013 Epigastric pain: Secondary | ICD-10-CM | POA: Diagnosis not present

## 2015-08-17 DIAGNOSIS — R0789 Other chest pain: Secondary | ICD-10-CM | POA: Diagnosis not present

## 2015-09-04 DIAGNOSIS — M5136 Other intervertebral disc degeneration, lumbar region: Secondary | ICD-10-CM | POA: Diagnosis not present

## 2015-09-04 DIAGNOSIS — M961 Postlaminectomy syndrome, not elsewhere classified: Secondary | ICD-10-CM | POA: Diagnosis not present

## 2015-09-13 DIAGNOSIS — M545 Low back pain: Secondary | ICD-10-CM | POA: Diagnosis not present

## 2015-09-13 DIAGNOSIS — R1084 Generalized abdominal pain: Secondary | ICD-10-CM | POA: Diagnosis not present

## 2015-09-13 DIAGNOSIS — K219 Gastro-esophageal reflux disease without esophagitis: Secondary | ICD-10-CM | POA: Diagnosis not present

## 2015-10-06 ENCOUNTER — Encounter (HOSPITAL_COMMUNITY): Payer: Self-pay | Admitting: Emergency Medicine

## 2015-10-06 ENCOUNTER — Emergency Department (HOSPITAL_COMMUNITY)
Admission: EM | Admit: 2015-10-06 | Discharge: 2015-10-06 | Disposition: A | Discharge status: Home / Self Care | Payer: Medicare Other | Attending: Emergency Medicine | Admitting: Emergency Medicine

## 2015-10-06 DIAGNOSIS — I2510 Atherosclerotic heart disease of native coronary artery without angina pectoris: Secondary | ICD-10-CM | POA: Insufficient documentation

## 2015-10-06 DIAGNOSIS — G8929 Other chronic pain: Secondary | ICD-10-CM | POA: Diagnosis not present

## 2015-10-06 DIAGNOSIS — M545 Low back pain: Secondary | ICD-10-CM

## 2015-10-06 DIAGNOSIS — Z8776 Personal history of (corrected) congenital malformations of integument, limbs and musculoskeletal system: Secondary | ICD-10-CM | POA: Insufficient documentation

## 2015-10-06 DIAGNOSIS — F329 Major depressive disorder, single episode, unspecified: Secondary | ICD-10-CM | POA: Insufficient documentation

## 2015-10-06 DIAGNOSIS — E669 Obesity, unspecified: Secondary | ICD-10-CM | POA: Insufficient documentation

## 2015-10-06 DIAGNOSIS — Z880 Allergy status to penicillin: Secondary | ICD-10-CM | POA: Insufficient documentation

## 2015-10-06 DIAGNOSIS — F17210 Nicotine dependence, cigarettes, uncomplicated: Secondary | ICD-10-CM | POA: Diagnosis not present

## 2015-10-06 DIAGNOSIS — E7800 Pure hypercholesterolemia, unspecified: Secondary | ICD-10-CM | POA: Insufficient documentation

## 2015-10-06 DIAGNOSIS — K219 Gastro-esophageal reflux disease without esophagitis: Secondary | ICD-10-CM | POA: Diagnosis not present

## 2015-10-06 DIAGNOSIS — Z7984 Long term (current) use of oral hypoglycemic drugs: Secondary | ICD-10-CM | POA: Diagnosis not present

## 2015-10-06 DIAGNOSIS — E119 Type 2 diabetes mellitus without complications: Secondary | ICD-10-CM | POA: Insufficient documentation

## 2015-10-06 DIAGNOSIS — Z79899 Other long term (current) drug therapy: Secondary | ICD-10-CM | POA: Diagnosis not present

## 2015-10-06 DIAGNOSIS — Z86711 Personal history of pulmonary embolism: Secondary | ICD-10-CM | POA: Insufficient documentation

## 2015-10-06 DIAGNOSIS — I10 Essential (primary) hypertension: Secondary | ICD-10-CM | POA: Insufficient documentation

## 2015-10-06 DIAGNOSIS — J449 Chronic obstructive pulmonary disease, unspecified: Secondary | ICD-10-CM | POA: Insufficient documentation

## 2015-10-06 MED ORDER — METHOCARBAMOL 750 MG PO TABS: 750.0000 mg | ORAL_TABLET | Freq: Three times a day (TID) | ORAL | Status: DC

## 2015-10-06 MED ORDER — METHOCARBAMOL 1000 MG/10ML IJ SOLN
1000.0000 mg | Freq: Once | INTRAMUSCULAR | Status: DC
  Filled 2015-10-06: qty 10

## 2015-10-06 MED ORDER — KETOROLAC TROMETHAMINE 60 MG/2ML IM SOLN
30.0000 mg | Freq: Once | INTRAMUSCULAR | Status: AC
  Administered 2015-10-06: 30 mg via INTRAMUSCULAR
  Filled 2015-10-06: qty 2

## 2015-10-06 MED ORDER — METHOCARBAMOL 500 MG PO TABS
750.0000 mg | ORAL_TABLET | Freq: Once | ORAL | Status: AC
  Administered 2015-10-06: 750 mg via ORAL
  Filled 2015-10-06: qty 2
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
## 2015-10-06 NOTE — Discharge Instructions (Signed)
I recommend continuing to take your pain medication as prescribed at home. I also recommend continuing to apply ice/heat to affected area for 15-20 minutes 3-4 times daily for pain relief. You may also take 600 mg ibuprofen 4 times daily for pain relief. Follow-up with your primary care provider in the next 3-4 days. Return to the emergency department if symptoms worsen or new onset of fever, numbness, tingling, saddle anesthesia, loss of bowel or bladder, weakness.

## 2015-10-06 NOTE — ED Notes (Signed)
Pt placed into gown 

## 2015-10-06 NOTE — ED Notes (Signed)
Declined W/C at D/C and was escorted to lobby by RN. 

## 2015-10-06 NOTE — ED Notes (Signed)
Pt sts left sided lower back and buttocks pain into leg; pt sts injections for same over last 2 months but not helping

## 2015-10-09 IMAGING — CR DG ABDOMEN 1V
1 series · 1 of 1 positions shown · non-contrast
Comparison: 12/09/2014 CT abdomen/pelvis

CLINICAL DATA: Abdominal pain

EXAM:
ABDOMEN - 1 VIEW

[t abdomen supine]
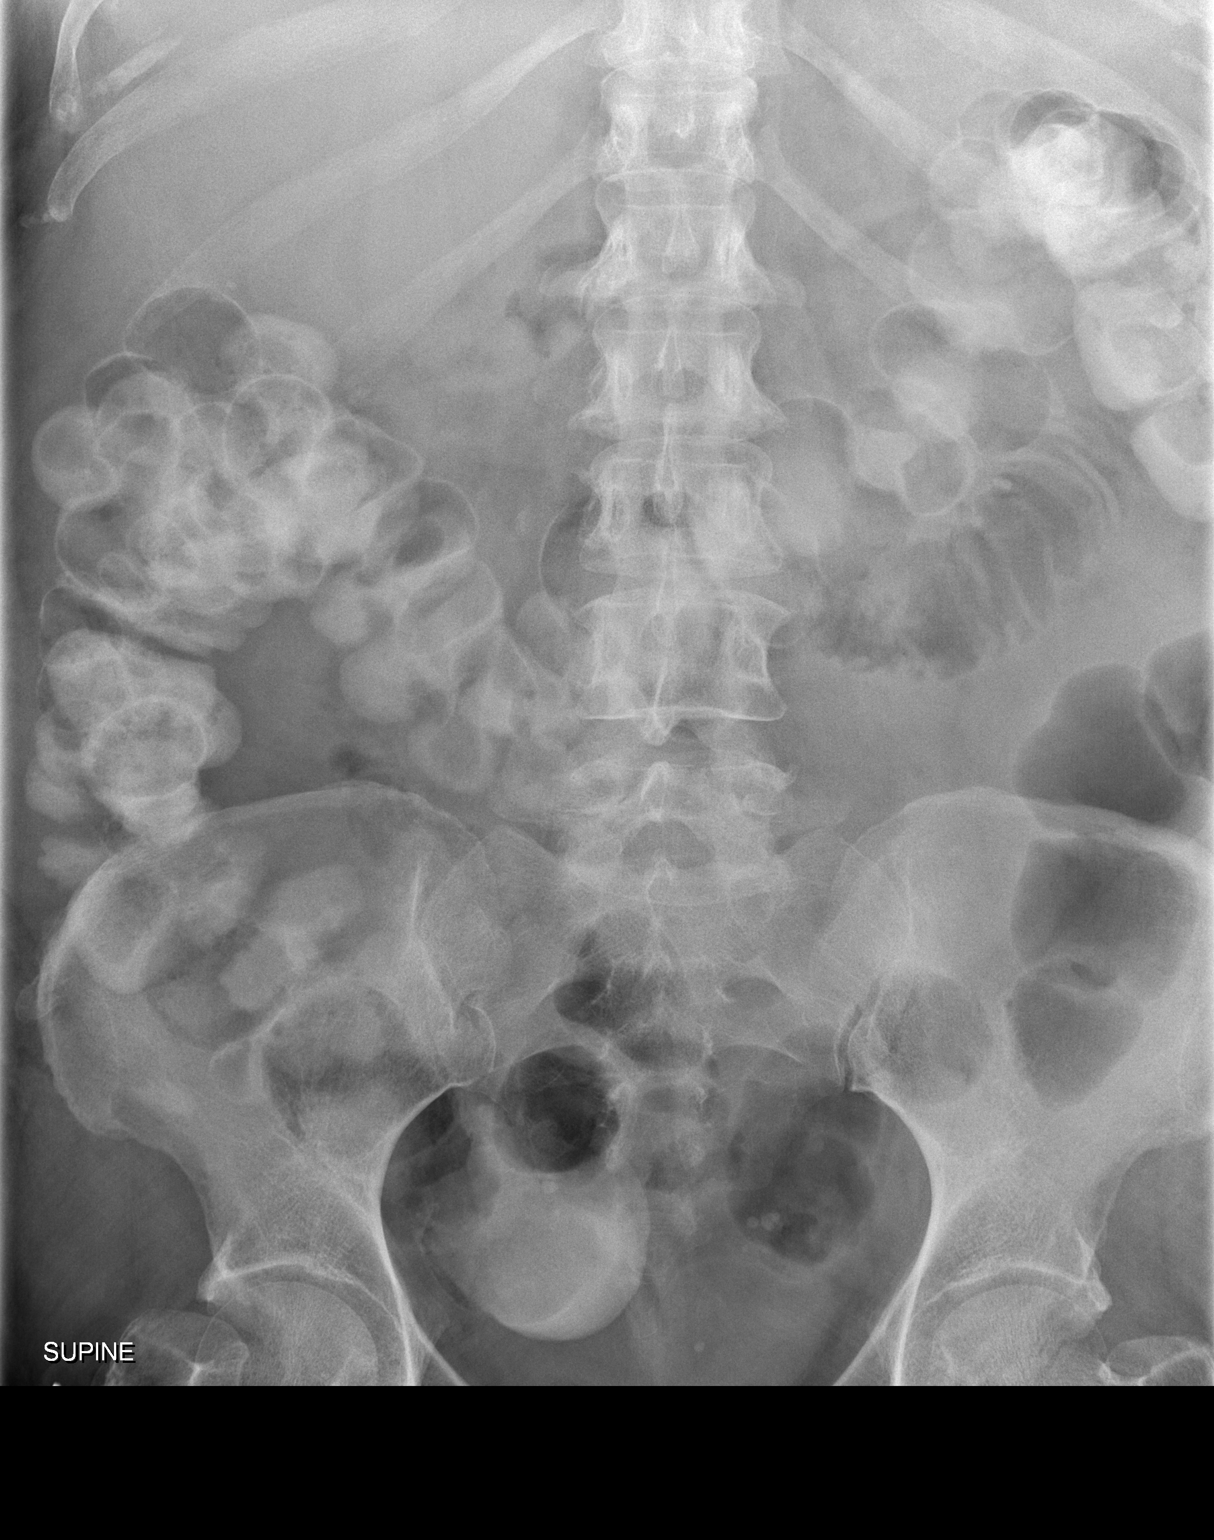

[1 of 1 positions shown; findings below may reference images not displayed]

FINDINGS: Again noted is an mildly dilated left upper quadrant abdominal loop
of small bowel measuring 3.8 cm maximally. Residual contrast noted
in nondilated colon. Presence or absence of air-fluid levels or free
air is suboptimally evaluated on this supine projection. No new
abnormal radiopacity. No acute osseous finding.
IMPRESSION: Single dilated gas-filled left mid abdominal small bowel loop which
could indicate partial/early small bowel obstruction or focal ileus.

## 2015-10-09 IMAGING — CR DG ABDOMEN 1V
1 series · 1 of 1 positions shown · non-contrast
Comparison: Supine examination performed earlier today

CLINICAL DATA: Recent small bowel obstruction.  Abdominal pain

EXAM:
ABDOMEN - 1 VIEW

[abdomen erect]
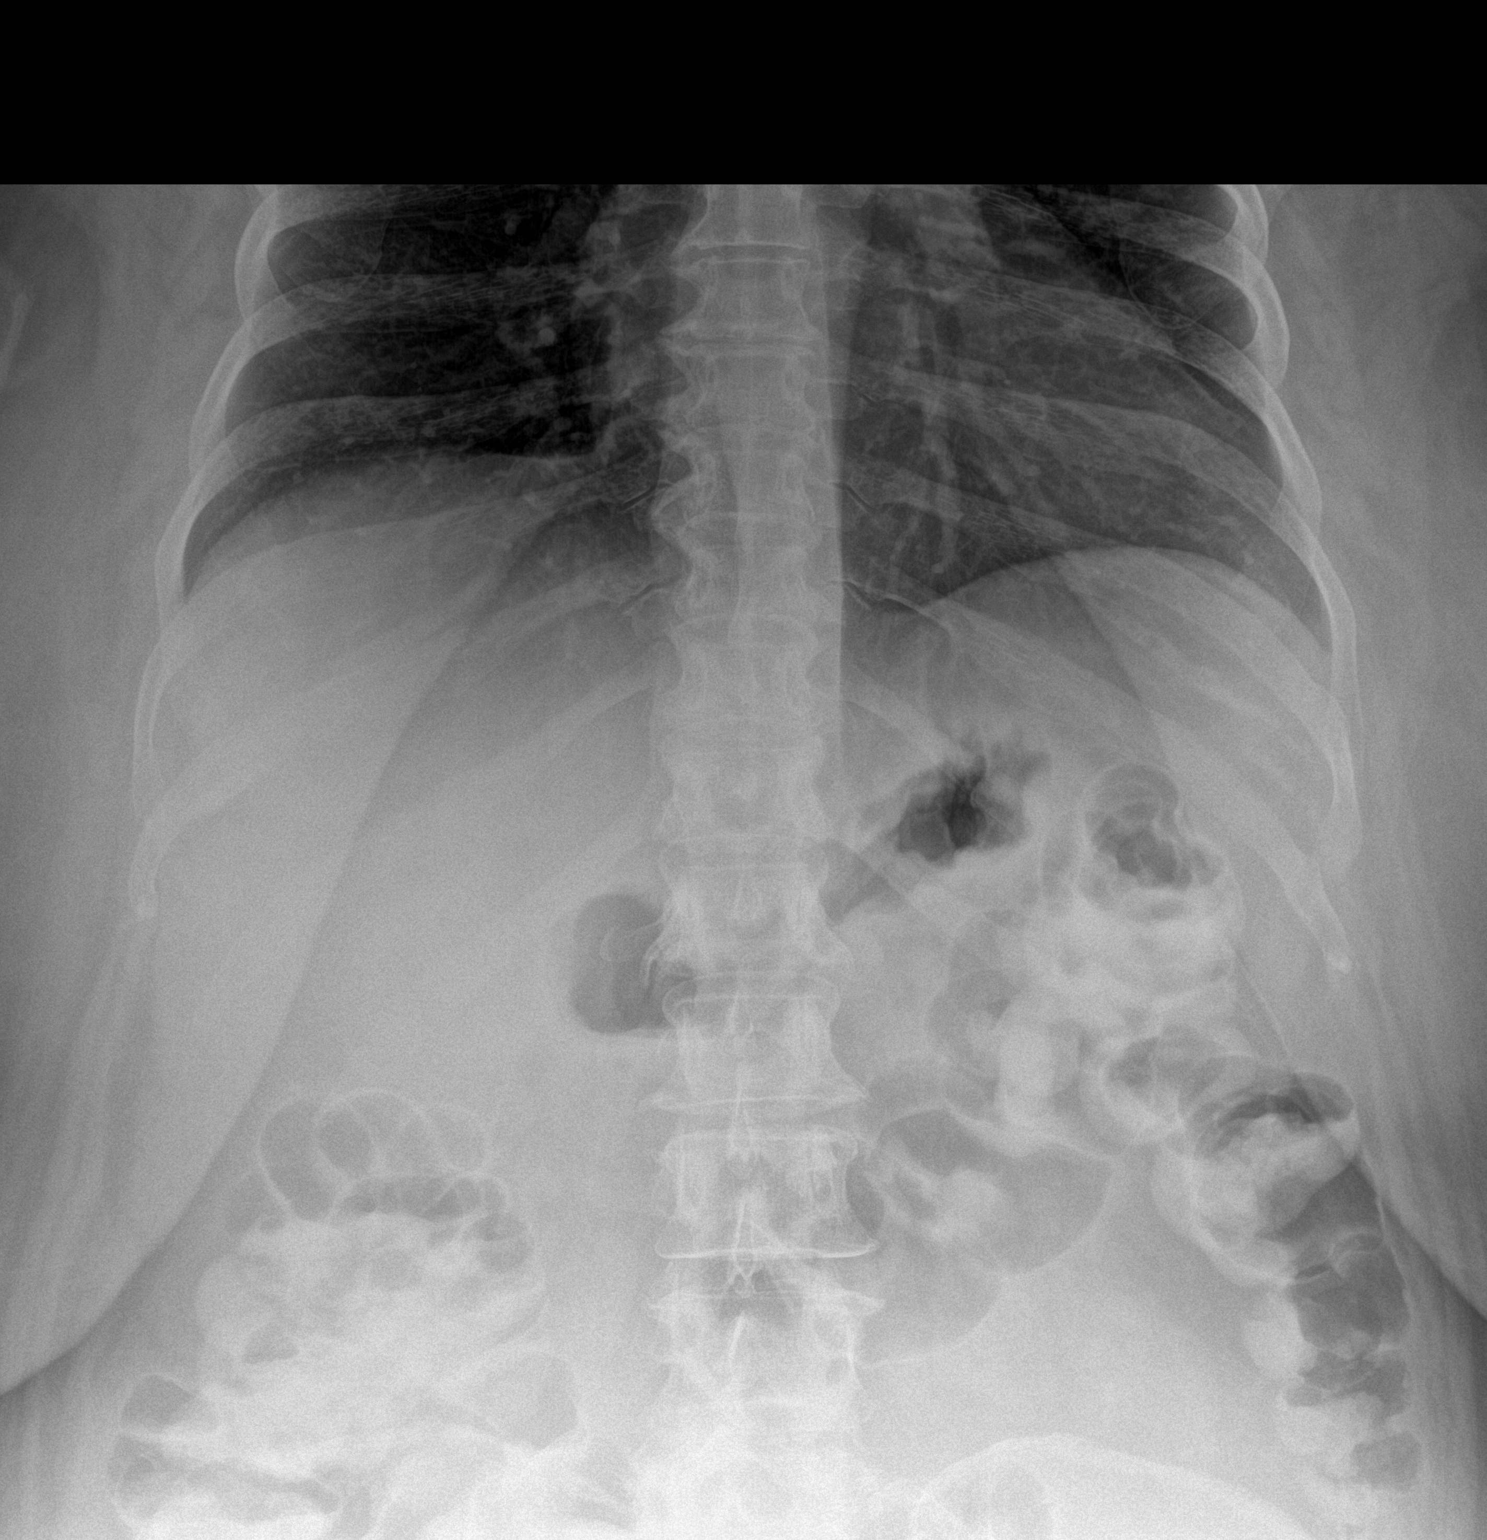

[1 of 1 positions shown; findings below may reference images not displayed]

FINDINGS: Upright image shows no appreciable bowel dilatation or air-fluid
level to suggest obstruction. No free air. Lung bases are clear.
There is contrast in the colon.
IMPRESSION: No free air. Overall bowel gas pattern currently unremarkable.
Contrast in large bowel.

## 2015-10-15 ENCOUNTER — Encounter: Payer: Self-pay | Admitting: Neurology

## 2015-10-15 ENCOUNTER — Ambulatory Visit (INDEPENDENT_AMBULATORY_CARE_PROVIDER_SITE_OTHER): Payer: Medicare Other | Admitting: Neurology

## 2015-10-15 VITALS — BP 135/88 | HR 86 | Ht 61.0 in | Wt 226.5 lb

## 2015-10-15 DIAGNOSIS — M5442 Lumbago with sciatica, left side: Secondary | ICD-10-CM

## 2015-10-15 DIAGNOSIS — G8929 Other chronic pain: Secondary | ICD-10-CM | POA: Diagnosis not present

## 2015-10-15 DIAGNOSIS — G43009 Migraine without aura, not intractable, without status migrainosus: Secondary | ICD-10-CM

## 2015-10-15 HISTORY — DX: Other chronic pain: G89.29
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
## 2015-10-15 NOTE — Patient Instructions (Signed)
We will check EMG and NCV study and get MRI of the low back.  Chronic Pain Chronic pain can be defined as pain that is off and on and lasts for 3-6 months or longer. Many things cause chronic pain, which can make it difficult to make a diagnosis. There are many treatment options available for chronic pain. However, finding a treatment that works well for you may require trying various approaches until the right one is found. Many people benefit from a combination of two or more types of treatment to control their pain. SYMPTOMS  Chronic pain can occur anywhere in the body and can range from mild to very severe. Some types of chronic pain include:  Headache.  Low back pain.  Cancer pain.  Arthritis pain.  Neurogenic pain. This is pain resulting from damage to nerves. People with chronic pain may also have other symptoms such as:  Depression.  Anger.  Insomnia.  Anxiety. DIAGNOSIS  Your health care provider will help diagnose your condition over time. In many cases, the initial focus will be on excluding possible conditions that could be causing the pain. Depending on your symptoms, your health care provider may order tests to diagnose your condition. Some of these tests may include:   Blood tests.   CT scan.   MRI.   X-rays.   Ultrasounds.   Nerve conduction studies.  You may need to see a specialist.  TREATMENT  Finding treatment that works well may take time. You may be referred to a pain specialist. He or she may prescribe medicine or therapies, such as:   Mindful meditation or yoga.  Shots (injections) of numbing or pain-relieving medicines into the spine or area of pain.  Local electrical stimulation.  Acupuncture.   Massage therapy.   Aroma, color, light, or sound therapy.   Biofeedback.   Working with a physical therapist to keep from getting stiff.   Regular, gentle exercise.   Cognitive or behavioral therapy.   Group support.   Sometimes, surgery may be recommended.  HOME CARE INSTRUCTIONS   Take all medicines as directed by your health care provider.   Lessen stress in your life by relaxing and doing things such as listening to calming music.   Exercise or be active as directed by your health care provider.   Eat a healthy diet and include things such as vegetables, fruits, fish, and lean meats in your diet.   Keep all follow-up appointments with your health care provider.   Attend a support group with others suffering from chronic pain. SEEK MEDICAL CARE IF:   Your pain gets worse.   You develop a new pain that was not there before.   You cannot tolerate medicines given to you by your health care provider.   You have new symptoms since your last visit with your health care provider.  SEEK IMMEDIATE MEDICAL CARE IF:   You feel weak.   You have decreased sensation or numbness.   You lose control of bowel or bladder function.   Your pain suddenly gets much worse.   You develop shaking.  You develop chills.  You develop confusion.  You develop chest pain.  You develop shortness of breath.  MAKE SURE YOU:  Understand these instructions.  Will watch your condition.  Will get help right away if you are not doing well or get worse.   This information is not intended to replace advice given to you by your health care provider. Make sure  you discuss any questions you have with your health care provider.   Document Released: 05/24/2002 Document Revised: 05/04/2013 Document Reviewed: 02/25/2013 Elsevier Interactive Patient Education [employer].

## 2015-10-22 ENCOUNTER — Ambulatory Visit
Admission: RE | Admit: 2015-10-22 | Discharge: 2015-10-22 | Disposition: A | Discharge status: Home / Self Care | Payer: Medicare Other | Source: Ambulatory Visit | Attending: Neurology | Admitting: Neurology

## 2015-10-22 DIAGNOSIS — M5442 Lumbago with sciatica, left side: Secondary | ICD-10-CM | POA: Diagnosis not present

## 2015-10-22 DIAGNOSIS — G8929 Other chronic pain: Secondary | ICD-10-CM

## 2015-10-22 DIAGNOSIS — G43009 Migraine without aura, not intractable, without status migrainosus: Secondary | ICD-10-CM

## 2015-10-22 MED ORDER — GADOBENATE DIMEGLUMINE 529 MG/ML IV SOLN
20.0000 mL | Freq: Once | INTRAVENOUS | Status: AC | PRN
  Administered 2015-10-22: 20 mL via INTRAVENOUS

## 2015-10-23 ENCOUNTER — Telehealth: Payer: Self-pay | Admitting: Neurology

## 2015-10-23 DIAGNOSIS — M5417 Radiculopathy, lumbosacral region: Secondary | ICD-10-CM

## 2015-10-23 NOTE — Telephone Encounter (Signed)
  I called the patient. MRI of the low back shows evidence of impingement of the left L5 nerve root, this correlates well clinically with the patient's symptoms. The patient has had epidural steroid injections on 2 occasions without benefit. The patient has been seen previously by Dr. Tonita Cong, but at this time, she wishes to have a referral to a neurosurgeon. I will try to get this set up.  MRI lumbar 10/22/15:  IMPRESSION: This MRI of the lumbar spine with and without contrast shows the following:  1. Left L4 laminectomy consistent with prior surgery at L3-L4. No epidural fibrosis is noted. There is no nerve root impingement at this level. 2. There is severe left foraminal narrowing at L5S1 leading to left L5 nerve root compression due to 8 mm anterolisthesis and protrusion of the uncovered disc, more to the left. There is also moderate foraminal narrowing on the right with less potential for right L5 nerve root compression.

## 2015-11-07 ENCOUNTER — Encounter: Payer: Medicare Other | Admitting: Neurology

## 2015-11-09 ENCOUNTER — Other Ambulatory Visit: Payer: Self-pay | Admitting: Neurosurgery

## 2015-11-09 DIAGNOSIS — M4316 Spondylolisthesis, lumbar region: Secondary | ICD-10-CM

## 2015-12-20 ENCOUNTER — Other Ambulatory Visit: Payer: Self-pay | Admitting: Family Medicine

## 2015-12-20 DIAGNOSIS — N63 Unspecified lump in unspecified breast: Secondary | ICD-10-CM

## 2015-12-21 ENCOUNTER — Other Ambulatory Visit: Payer: Self-pay | Admitting: Family Medicine

## 2015-12-21 DIAGNOSIS — N63 Unspecified lump in unspecified breast: Secondary | ICD-10-CM

## 2015-12-28 ENCOUNTER — Other Ambulatory Visit: Payer: Self-pay | Admitting: Family Medicine

## 2015-12-28 ENCOUNTER — Ambulatory Visit
Admission: RE | Admit: 2015-12-28 | Discharge: 2015-12-28 | Disposition: A | Discharge status: Home / Self Care | Payer: Medicare Other | Source: Ambulatory Visit | Attending: Family Medicine | Admitting: Family Medicine

## 2015-12-28 DIAGNOSIS — N63 Unspecified lump in unspecified breast: Secondary | ICD-10-CM

## 2016-01-02 ENCOUNTER — Ambulatory Visit
Admission: RE | Admit: 2016-01-02 | Discharge: 2016-01-02 | Disposition: A | Discharge status: Home / Self Care | Payer: Medicare Other | Source: Ambulatory Visit | Attending: Family Medicine | Admitting: Family Medicine

## 2016-01-02 DIAGNOSIS — N63 Unspecified lump in unspecified breast: Secondary | ICD-10-CM

## 2016-09-23 ENCOUNTER — Other Ambulatory Visit (HOSPITAL_COMMUNITY): Payer: Self-pay | Admitting: General Surgery

## 2016-09-30 ENCOUNTER — Other Ambulatory Visit (HOSPITAL_COMMUNITY): Payer: Self-pay | Admitting: General Surgery

## 2016-09-30 ENCOUNTER — Ambulatory Visit (HOSPITAL_COMMUNITY)
Admission: RE | Admit: 2016-09-30 | Discharge: 2016-09-30 | Disposition: A | Discharge status: Home / Self Care | Payer: Medicare Other | Source: Ambulatory Visit | Attending: General Surgery | Admitting: General Surgery

## 2016-09-30 DIAGNOSIS — K222 Esophageal obstruction: Secondary | ICD-10-CM | POA: Diagnosis not present

## 2016-10-08 ENCOUNTER — Encounter: Payer: Self-pay | Admitting: Skilled Nursing Facility1

## 2016-10-08 ENCOUNTER — Encounter: Payer: Medicare Other | Attending: General Surgery | Admitting: Skilled Nursing Facility1

## 2016-10-08 DIAGNOSIS — Z713 Dietary counseling and surveillance: Secondary | ICD-10-CM | POA: Diagnosis not present

## 2016-10-08 DIAGNOSIS — E119 Type 2 diabetes mellitus without complications: Secondary | ICD-10-CM

## 2016-10-08 DIAGNOSIS — Z6841 Body Mass Index (BMI) 40.0 and over, adult: Secondary | ICD-10-CM | POA: Insufficient documentation

## 2016-10-08 NOTE — Patient Instructions (Addendum)
Follow Pre-Op Goals Try Protein Shakes Call [facility] at [phone] when surgery is scheduled to enroll in Pre-Op Class  Things to remember:  Please always be honest with [place]. We want to support you!  If you have any questions or concerns in between appointments, please call or email   The diet after surgery will be high protein and low in carbohydrate.  Vitamins and calcium need to be taken for the rest of your life.  Feel free to include support people in any classes or appointments.  -Add protein with your crackers and any other energy source such as fruit -Try not to use whole milk --Check your blood sugar every day: either first thing in the morning or 2 hours after you eat --Work on chair exercises Supplement recommendations:  Before Surgery   1 Complete Multivitamin with Iron  3000 IU Vitamin D3  After Surgery   2 Chewable Multivitamins  **Best Choice - Bariatric Advantage Advanced Multi EA      3 Chewable Calcium (500 mg each, total 1200-1500 mg per day)  **Best Choice - Celebrate, Bariatric Advantage, or Wellesse  Other Options:    2 Flinstones Complete + up to 100 mg Thiamin + 2000-3000 IU Vitamin D3 + 350-500 mcg Vitamin B12 + 30-45 mg Iron (with history of deficiency)  2 Celebrate MultiComplete with 18 mg Iron (this provides 6000 IU of  Vitamin D3)  4 Celebrate Essential Multi 2 in 1 (has calcium) + 18-60 mg separate  iron  Vitamins and Calcium are available at:   [facility]   [place], [place], [place] [address]   [email or web address]  [email or web address]  www.[email or web address]

## 2016-10-08 NOTE — Progress Notes (Addendum)
  Pre-Op Assessment Visit:  Pre-Operative Sleeve Gastrectomy Surgery  Medical Nutrition Therapy:  Appt start time: 9:00   End time: 10:00  Patient was seen on 10/08/2016 for Pre-Operative Nutrition Assessment. Assessment and letter of approval faxed to [facility] Bariatric Surgery Program coordinator on 10/08/2016.  Pt states her esophagus has narrowed with stricture and she avoids eating whenever possible due to abdominal pain. Pt states she has severe acid reflux.  Sample Given: exp: 07/oct/2018 Lot: [id number] Vanilla  Wt: 240.7: addendum was made originally typed 140.7 in error  Ht: 62 inches  -Follow diet recommendations listed below   Energy and Macronutrient Recomendations: Calories: 1500 Carbohydrate: 170 Protein: 112 Fat: 42  Encouraged to engage in 30  minutes of moderate physical activity including cardiovascular and weight baring weekly  Preferred Learning Style:   No preference indicated   Learning Readiness:   Change in progress  Handouts given during visit include:  Pre-Op Goals Bariatric Surgery Protein Shakes  During the appointment today the following Pre-Op Goals were reviewed with the patient: Maintain or lose weight as instructed by your surgeon Make healthy food choices Begin to limit portion sizes Limited concentrated sugars and fried foods Keep fat/sugar in the single digits per serving on  food labels Practice CHEWING your food  (aim for 30 chews per bite or until applesauce consistency) Practice not drinking 15 minutes before, during, and 30 minutes after each meal/snack Avoid all carbonated beverages  Avoid/limit caffeinated beverages  Avoid all sugar-sweetened beverages Consume 3 meals per day; eat every 3-5 hours Make a list of non-food related activities Aim for 64-100 ounces of FLUID daily  Aim for at least 60-80 grams of PROTEIN daily Look for a liquid protein source that contain ?15 g protein and ?5 g carbohydrate  (ex:  shakes, drinks, shots) -Add protein with your crackers and any other energy source such as fruit -Try not to use whole milk -Check your blood sugar every day: either first thing in the morning or 2 hours after you eat -Work on chair exercises Patient-Centered Goals: 10/10 specific/non-scale and confidence/importance scale 1-10  Demonstrated degree of understanding via:  Teach Back  Teaching Method Utilized:  Visual Auditory Hands on  Barriers to learning/adherence to lifestyle change: none identified   Patient to call the [facility] to enroll in Pre-Op and Post-Op Nutrition Education when surgery date is scheduled.

## 2016-10-12 ENCOUNTER — Encounter (HOSPITAL_COMMUNITY): Payer: Self-pay | Admitting: Emergency Medicine

## 2016-10-12 ENCOUNTER — Inpatient Hospital Stay (HOSPITAL_COMMUNITY)
Admission: EM | Admit: 2016-10-12 | Discharge: 2016-10-14 | DRG: 193 | Disposition: A | Discharge status: Home Health | Payer: Medicare Other | Attending: Internal Medicine | Admitting: Internal Medicine

## 2016-10-12 ENCOUNTER — Emergency Department (HOSPITAL_COMMUNITY): Payer: Medicare Other

## 2016-10-12 DIAGNOSIS — J441 Chronic obstructive pulmonary disease with (acute) exacerbation: Secondary | ICD-10-CM

## 2016-10-12 DIAGNOSIS — R5381 Other malaise: Secondary | ICD-10-CM

## 2016-10-12 DIAGNOSIS — I2510 Atherosclerotic heart disease of native coronary artery without angina pectoris: Secondary | ICD-10-CM | POA: Diagnosis not present

## 2016-10-12 DIAGNOSIS — E1165 Type 2 diabetes mellitus with hyperglycemia: Secondary | ICD-10-CM

## 2016-10-12 DIAGNOSIS — Z823 Family history of stroke: Secondary | ICD-10-CM

## 2016-10-12 DIAGNOSIS — F329 Major depressive disorder, single episode, unspecified: Secondary | ICD-10-CM | POA: Diagnosis present

## 2016-10-12 DIAGNOSIS — K219 Gastro-esophageal reflux disease without esophagitis: Secondary | ICD-10-CM | POA: Diagnosis present

## 2016-10-12 DIAGNOSIS — G43909 Migraine, unspecified, not intractable, without status migrainosus: Secondary | ICD-10-CM | POA: Diagnosis present

## 2016-10-12 DIAGNOSIS — K7460 Unspecified cirrhosis of liver: Secondary | ICD-10-CM | POA: Diagnosis present

## 2016-10-12 DIAGNOSIS — Z8249 Family history of ischemic heart disease and other diseases of the circulatory system: Secondary | ICD-10-CM

## 2016-10-12 DIAGNOSIS — Z7984 Long term (current) use of oral hypoglycemic drugs: Secondary | ICD-10-CM

## 2016-10-12 DIAGNOSIS — Z86711 Personal history of pulmonary embolism: Secondary | ICD-10-CM

## 2016-10-12 DIAGNOSIS — J9621 Acute and chronic respiratory failure with hypoxia: Secondary | ICD-10-CM | POA: Diagnosis present

## 2016-10-12 DIAGNOSIS — K5909 Other constipation: Secondary | ICD-10-CM | POA: Diagnosis present

## 2016-10-12 DIAGNOSIS — F419 Anxiety disorder, unspecified: Secondary | ICD-10-CM | POA: Diagnosis present

## 2016-10-12 DIAGNOSIS — G4730 Sleep apnea, unspecified: Secondary | ICD-10-CM | POA: Diagnosis present

## 2016-10-12 DIAGNOSIS — M4310 Spondylolisthesis, site unspecified: Secondary | ICD-10-CM | POA: Diagnosis present

## 2016-10-12 DIAGNOSIS — E785 Hyperlipidemia, unspecified: Secondary | ICD-10-CM | POA: Diagnosis present

## 2016-10-12 DIAGNOSIS — J9620 Acute and chronic respiratory failure, unspecified whether with hypoxia or hypercapnia: Secondary | ICD-10-CM | POA: Diagnosis present

## 2016-10-12 DIAGNOSIS — J449 Chronic obstructive pulmonary disease, unspecified: Secondary | ICD-10-CM | POA: Diagnosis present

## 2016-10-12 DIAGNOSIS — Z90710 Acquired absence of both cervix and uterus: Secondary | ICD-10-CM

## 2016-10-12 DIAGNOSIS — G8929 Other chronic pain: Secondary | ICD-10-CM | POA: Diagnosis present

## 2016-10-12 DIAGNOSIS — J101 Influenza due to other identified influenza virus with other respiratory manifestations: Secondary | ICD-10-CM | POA: Diagnosis not present

## 2016-10-12 DIAGNOSIS — E1159 Type 2 diabetes mellitus with other circulatory complications: Secondary | ICD-10-CM | POA: Diagnosis present

## 2016-10-12 DIAGNOSIS — E119 Type 2 diabetes mellitus without complications: Secondary | ICD-10-CM | POA: Diagnosis present

## 2016-10-12 DIAGNOSIS — G4733 Obstructive sleep apnea (adult) (pediatric): Secondary | ICD-10-CM | POA: Diagnosis present

## 2016-10-12 DIAGNOSIS — F17210 Nicotine dependence, cigarettes, uncomplicated: Secondary | ICD-10-CM | POA: Diagnosis present

## 2016-10-12 DIAGNOSIS — G894 Chronic pain syndrome: Secondary | ICD-10-CM | POA: Diagnosis present

## 2016-10-12 DIAGNOSIS — Z720 Tobacco use: Secondary | ICD-10-CM | POA: Diagnosis present

## 2016-10-12 DIAGNOSIS — R0602 Shortness of breath: Secondary | ICD-10-CM | POA: Diagnosis not present

## 2016-10-12 DIAGNOSIS — Z9119 Patient's noncompliance with other medical treatment and regimen: Secondary | ICD-10-CM

## 2016-10-12 DIAGNOSIS — Z6841 Body Mass Index (BMI) 40.0 and over, adult: Secondary | ICD-10-CM

## 2016-10-12 DIAGNOSIS — I10 Essential (primary) hypertension: Secondary | ICD-10-CM | POA: Diagnosis present

## 2016-10-12 DIAGNOSIS — M5442 Lumbago with sciatica, left side: Secondary | ICD-10-CM | POA: Diagnosis present

## 2016-10-12 HISTORY — DX: Chronic obstructive pulmonary disease with (acute) exacerbation: J44.1

## 2016-10-12 LAB — BASIC METABOLIC PANEL
Anion gap: 11 (ref 5–15)
BUN: 6 mg/dL (ref 6–20)
CO2: 26 mmol/L (ref 22–32)
CREATININE: 0.77 mg/dL (ref 0.44–1.00)
Calcium: 8.1 mg/dL — ABNORMAL LOW (ref 8.9–10.3)
Chloride: 96 mmol/L — ABNORMAL LOW (ref 101–111)
GFR calc Af Amer: >60 mL/min (ref >60)
Glucose, Bld: 150 mg/dL — ABNORMAL HIGH (ref 65–99)
Potassium: 3.9 mmol/L (ref 3.5–5.1)
SODIUM: 133 mmol/L — AB (ref 135–145)

## 2016-10-12 LAB — GLUCOSE, CAPILLARY
GLUCOSE-CAPILLARY: 176 mg/dL — AB (ref 65–99)
GLUCOSE-CAPILLARY: 155 mg/dL — AB (ref 65–99)
Glucose-Capillary: 200 mg/dL — ABNORMAL HIGH (ref 65–99)

## 2016-10-12 LAB — RESPIRATORY PANEL BY PCR
ADENOVIRUS-RVPPCR: NOT DETECTED
Bordetella pertussis: NOT DETECTED
CHLAMYDOPHILA PNEUMONIAE-RVPPCR: NOT DETECTED
CORONAVIRUS HKU1-RVPPCR: NOT DETECTED
CORONAVIRUS NL63-RVPPCR: NOT DETECTED
CORONAVIRUS OC43-RVPPCR: NOT DETECTED
Coronavirus 229E: NOT DETECTED
INFLUENZA A H3-RVPPCR: DETECTED — AB
Influenza B: NOT DETECTED
METAPNEUMOVIRUS-RVPPCR: NOT DETECTED
Mycoplasma pneumoniae: NOT DETECTED
PARAINFLUENZA VIRUS 2-RVPPCR: NOT DETECTED
PARAINFLUENZA VIRUS 3-RVPPCR: NOT DETECTED
PARAINFLUENZA VIRUS 4-RVPPCR: NOT DETECTED
Parainfluenza Virus 1: NOT DETECTED
RHINOVIRUS / ENTEROVIRUS - RVPPCR: NOT DETECTED
Respiratory Syncytial Virus: NOT DETECTED

## 2016-10-12 LAB — CBC
HCT: 39.2 % (ref 36.0–46.0)
Hemoglobin: 12 g/dL (ref 12.0–15.0)
MCH: 26 pg (ref 26.0–34.0)
MCHC: 30.6 g/dL (ref 30.0–36.0)
MCV: 84.8 fL (ref 78.0–100.0)
PLATELETS: 148 10*3/uL — AB (ref 150–400)
RBC: 4.62 MIL/uL (ref 3.87–5.11)
RDW: 15.5 % (ref 11.5–15.5)
WBC: 8.6 10*3/uL (ref 4.0–10.5)

## 2016-10-12 LAB — INFLUENZA PANEL BY PCR (TYPE A & B)
INFLBPCR: NEGATIVE
Influenza A By PCR: POSITIVE — AB

## 2016-10-12 LAB — BRAIN NATRIURETIC PEPTIDE: B Natriuretic Peptide: 82.2 pg/mL (ref 0.0–100.0)

## 2016-10-12 LAB — TROPONIN I: Troponin I: <0.03 ng/mL (ref <0.03)

## 2016-10-12 MED ORDER — PANTOPRAZOLE SODIUM 40 MG PO TBEC
40.0000 mg | DELAYED_RELEASE_TABLET | Freq: Every day | ORAL | Status: DC
  Administered 2016-10-12 – 2016-10-14 (×3): 40 mg via ORAL
  Filled 2016-10-12 (×3): qty 1

## 2016-10-12 MED ORDER — LACTULOSE 10 GM/15ML PO SOLN: 10.0000 g | Freq: Two times a day (BID) | ORAL | Status: DC | PRN

## 2016-10-12 MED ORDER — ESCITALOPRAM OXALATE 10 MG PO TABS
10.0000 mg | ORAL_TABLET | Freq: Every day | ORAL | Status: DC
  Administered 2016-10-12 – 2016-10-13 (×2): 10 mg via ORAL
  Filled 2016-10-12 (×2): qty 1

## 2016-10-12 MED ORDER — HYDROCODONE-ACETAMINOPHEN 5-325 MG PO TABS
1.0000 | ORAL_TABLET | ORAL | Status: DC | PRN
  Administered 2016-10-12 – 2016-10-13 (×3): 2 via ORAL
  Filled 2016-10-12 (×3): qty 2
  Filled 2016-10-12: qty 1

## 2016-10-12 MED ORDER — OSELTAMIVIR PHOSPHATE 75 MG PO CAPS
75.0000 mg | ORAL_CAPSULE | Freq: Once | ORAL | Status: AC
Start: 2016-10-12 — End: 2016-10-12
  Administered 2016-10-12: 75 mg via ORAL
  Filled 2016-10-12 (×2): qty 1

## 2016-10-12 MED ORDER — ENOXAPARIN SODIUM 60 MG/0.6ML ~~LOC~~ SOLN
0.5000 mg/kg | SUBCUTANEOUS | Status: DC
  Administered 2016-10-12 – 2016-10-13 (×2): 55 mg via SUBCUTANEOUS
  Filled 2016-10-12 (×2): qty 0.6

## 2016-10-12 MED ORDER — PROMETHAZINE HCL 25 MG PO TABS
25.0000 mg | ORAL_TABLET | Freq: Four times a day (QID) | ORAL | Status: DC | PRN
  Administered 2016-10-14: 25 mg via ORAL
  Filled 2016-10-12: qty 1

## 2016-10-12 MED ORDER — BENAZEPRIL HCL 40 MG PO TABS
40.0000 mg | ORAL_TABLET | Freq: Every day | ORAL | Status: DC
  Administered 2016-10-12: 40 mg via ORAL
  Filled 2016-10-12 (×2): qty 1

## 2016-10-12 MED ORDER — OSELTAMIVIR PHOSPHATE 75 MG PO CAPS
75.0000 mg | ORAL_CAPSULE | Freq: Two times a day (BID) | ORAL | Status: DC
  Administered 2016-10-12 – 2016-10-14 (×4): 75 mg via ORAL
  Filled 2016-10-12 (×5): qty 1

## 2016-10-12 MED ORDER — LEVOFLOXACIN IN D5W 750 MG/150ML IV SOLN
750.0000 mg | INTRAVENOUS | Status: DC
  Administered 2016-10-12 – 2016-10-13 (×2): 750 mg via INTRAVENOUS
  Filled 2016-10-12 (×2): qty 150

## 2016-10-12 MED ORDER — BUMETANIDE 1 MG PO TABS: 1.0000 mg | ORAL_TABLET | Freq: Every day | ORAL | Status: DC | PRN

## 2016-10-12 MED ORDER — SIMVASTATIN 20 MG PO TABS
20.0000 mg | ORAL_TABLET | Freq: Every day | ORAL | Status: DC
  Administered 2016-10-12 – 2016-10-13 (×2): 20 mg via ORAL
  Filled 2016-10-12 (×2): qty 1

## 2016-10-12 MED ORDER — ALPRAZOLAM 0.25 MG PO TABS: 1.0000 mg | ORAL_TABLET | Freq: Two times a day (BID) | ORAL | Status: DC

## 2016-10-12 MED ORDER — METHYLPREDNISOLONE SODIUM SUCC 125 MG IJ SOLR
60.0000 mg | Freq: Two times a day (BID) | INTRAMUSCULAR | Status: DC
  Administered 2016-10-12 – 2016-10-14 (×4): 60 mg via INTRAVENOUS
  Filled 2016-10-12 (×4): qty 2

## 2016-10-12 MED ORDER — ACETAMINOPHEN 325 MG PO TABS
650.0000 mg | ORAL_TABLET | Freq: Once | ORAL | Status: AC
  Administered 2016-10-12: 650 mg via ORAL
  Filled 2016-10-12: qty 2

## 2016-10-12 MED ORDER — ENOXAPARIN SODIUM 40 MG/0.4ML ~~LOC~~ SOLN: 40.0000 mg | SUBCUTANEOUS | Status: DC

## 2016-10-12 MED ORDER — IPRATROPIUM-ALBUTEROL 0.5-2.5 (3) MG/3ML IN SOLN
3.0000 mL | Freq: Three times a day (TID) | RESPIRATORY_TRACT | Status: DC
  Administered 2016-10-12 – 2016-10-14 (×4): 3 mL via RESPIRATORY_TRACT
  Filled 2016-10-12 (×6): qty 3

## 2016-10-12 MED ORDER — METOCLOPRAMIDE HCL 5 MG/ML IJ SOLN
10.0000 mg | Freq: Once | INTRAMUSCULAR | Status: AC
  Administered 2016-10-12: 10 mg via INTRAVENOUS
  Filled 2016-10-12: qty 2

## 2016-10-12 MED ORDER — IPRATROPIUM BROMIDE 0.02 % IN SOLN
0.5000 mg | Freq: Once | RESPIRATORY_TRACT | Status: AC
  Administered 2016-10-12: 0.5 mg via RESPIRATORY_TRACT
  Filled 2016-10-12: qty 2.5

## 2016-10-12 MED ORDER — CYCLOBENZAPRINE HCL 10 MG PO TABS: 10.0000 mg | ORAL_TABLET | Freq: Three times a day (TID) | ORAL | Status: DC | PRN

## 2016-10-12 MED ORDER — ALBUTEROL SULFATE (2.5 MG/3ML) 0.083% IN NEBU: 2.5000 mg | INHALATION_SOLUTION | RESPIRATORY_TRACT | Status: DC | PRN

## 2016-10-12 MED ORDER — ALBUTEROL (5 MG/ML) CONTINUOUS INHALATION SOLN
10.0000 mg/h | INHALATION_SOLUTION | Freq: Once | RESPIRATORY_TRACT | Status: AC
  Administered 2016-10-12: 10 mg/h via RESPIRATORY_TRACT
  Filled 2016-10-12: qty 20

## 2016-10-12 MED ORDER — ESTRADIOL 2 MG PO TABS
2.0000 mg | ORAL_TABLET | Freq: Every day | ORAL | Status: DC
  Administered 2016-10-12 – 2016-10-14 (×3): 2 mg via ORAL
  Filled 2016-10-12 (×3): qty 1

## 2016-10-12 MED ORDER — POTASSIUM CHLORIDE ER 10 MEQ PO TBCR: 10.0000 meq | EXTENDED_RELEASE_TABLET | ORAL | Status: DC | PRN

## 2016-10-12 MED ORDER — ONDANSETRON HCL 4 MG PO TABS: 4.0000 mg | ORAL_TABLET | Freq: Four times a day (QID) | ORAL | Status: DC | PRN

## 2016-10-12 MED ORDER — AMLODIPINE BESYLATE 5 MG PO TABS
5.0000 mg | ORAL_TABLET | Freq: Every day | ORAL | Status: DC
  Administered 2016-10-13 – 2016-10-14 (×2): 5 mg via ORAL
  Filled 2016-10-12 (×2): qty 1

## 2016-10-12 MED ORDER — FLUTICASONE PROPIONATE 50 MCG/ACT NA SUSP: 1.0000 | Freq: Every day | NASAL | Status: DC | PRN

## 2016-10-12 MED ORDER — ACETAMINOPHEN 650 MG RE SUPP: 650.0000 mg | Freq: Four times a day (QID) | RECTAL | Status: DC | PRN

## 2016-10-12 MED ORDER — IPRATROPIUM-ALBUTEROL 0.5-2.5 (3) MG/3ML IN SOLN
3.0000 mL | RESPIRATORY_TRACT | Status: DC
  Filled 2016-10-12: qty 3

## 2016-10-12 MED ORDER — INSULIN ASPART 100 UNIT/ML ~~LOC~~ SOLN
0.0000 [IU] | Freq: Three times a day (TID) | SUBCUTANEOUS | Status: DC
  Administered 2016-10-12 (×2): 2 [IU] via SUBCUTANEOUS
  Administered 2016-10-13: 5 [IU] via SUBCUTANEOUS
  Administered 2016-10-13: 3 [IU] via SUBCUTANEOUS
  Administered 2016-10-13 – 2016-10-14 (×3): 5 [IU] via SUBCUTANEOUS

## 2016-10-12 MED ORDER — OLOPATADINE HCL 0.1 % OP SOLN: 1.0000 [drp] | OPHTHALMIC | Status: DC | PRN

## 2016-10-12 MED ORDER — ALBUTEROL SULFATE (2.5 MG/3ML) 0.083% IN NEBU
5.0000 mg | INHALATION_SOLUTION | Freq: Once | RESPIRATORY_TRACT | Status: AC
  Administered 2016-10-12: 5 mg via RESPIRATORY_TRACT
  Filled 2016-10-12: qty 6

## 2016-10-12 MED ORDER — POLYETHYLENE GLYCOL 3350 17 G PO PACK: 17.0000 g | PACK | Freq: Every day | ORAL | Status: DC | PRN

## 2016-10-12 MED ORDER — ONDANSETRON HCL 4 MG/2ML IJ SOLN
4.0000 mg | Freq: Four times a day (QID) | INTRAMUSCULAR | Status: DC | PRN
  Administered 2016-10-13 (×2): 4 mg via INTRAVENOUS
  Filled 2016-10-12 (×2): qty 2

## 2016-10-12 MED ORDER — OXYCODONE HCL 5 MG PO TABS
10.0000 mg | ORAL_TABLET | ORAL | Status: DC | PRN
  Administered 2016-10-12 – 2016-10-14 (×11): 10 mg via ORAL
  Filled 2016-10-12 (×11): qty 2

## 2016-10-12 MED ORDER — ALPRAZOLAM 0.5 MG PO TABS
2.0000 mg | ORAL_TABLET | Freq: Every day | ORAL | Status: DC
  Administered 2016-10-12 – 2016-10-13 (×2): 2 mg via ORAL
  Filled 2016-10-12 (×2): qty 4

## 2016-10-12 MED ORDER — ALUM & MAG HYDROXIDE-SIMETH 200-200-20 MG/5ML PO SUSP
10.0000 mL | Freq: Four times a day (QID) | ORAL | Status: DC
  Administered 2016-10-12 – 2016-10-14 (×9): 10 mL via ORAL
  Filled 2016-10-12 (×7): qty 30

## 2016-10-12 MED ORDER — ACETAMINOPHEN 325 MG PO TABS
650.0000 mg | ORAL_TABLET | Freq: Four times a day (QID) | ORAL | Status: DC | PRN
  Administered 2016-10-13: 650 mg via ORAL
  Filled 2016-10-12: qty 2

## 2016-10-12 MED ORDER — NICOTINE 14 MG/24HR TD PT24
14.0000 mg | MEDICATED_PATCH | Freq: Every day | TRANSDERMAL | Status: DC
  Administered 2016-10-12: 14 mg via TRANSDERMAL
  Filled 2016-10-12: qty 1

## 2016-10-12 MED ORDER — DOCUSATE SODIUM 100 MG PO CAPS
100.0000 mg | ORAL_CAPSULE | Freq: Two times a day (BID) | ORAL | Status: DC
  Administered 2016-10-12 – 2016-10-13 (×3): 100 mg via ORAL
  Filled 2016-10-12 (×3): qty 1

## 2016-10-12 MED ORDER — SUCRALFATE 1 G PO TABS
1.0000 g | ORAL_TABLET | Freq: Three times a day (TID) | ORAL | Status: DC
  Administered 2016-10-12 – 2016-10-14 (×9): 1 g via ORAL
  Filled 2016-10-12 (×9): qty 1

## 2016-10-12 MED ORDER — ALBUTEROL SULFATE (2.5 MG/3ML) 0.083% IN NEBU
INHALATION_SOLUTION | RESPIRATORY_TRACT | Status: AC
  Filled 2016-10-12: qty 6

## 2016-10-12 MED ORDER — ALPRAZOLAM 0.5 MG PO TABS
1.0000 mg | ORAL_TABLET | Freq: Every morning | ORAL | Status: DC
  Administered 2016-10-13 – 2016-10-14 (×2): 1 mg via ORAL
  Filled 2016-10-12 (×2): qty 2

## 2016-10-12 MED ORDER — SIMETHICONE 80 MG PO CHEW: 80.0000 mg | CHEWABLE_TABLET | Freq: Four times a day (QID) | ORAL | Status: DC | PRN

## 2016-10-12 MED ORDER — GUAIFENESIN ER 600 MG PO TB12: 600.0000 mg | ORAL_TABLET | Freq: Two times a day (BID) | ORAL | Status: DC | PRN

## 2016-10-12 MED ORDER — METHYLPREDNISOLONE SODIUM SUCC 125 MG IJ SOLR
125.0000 mg | Freq: Once | INTRAMUSCULAR | Status: AC
  Administered 2016-10-12: 125 mg via INTRAVENOUS
  Filled 2016-10-12: qty 2

## 2016-10-12 MED ORDER — METHYLPREDNISOLONE SODIUM SUCC 125 MG IJ SOLR: 60.0000 mg | Freq: Two times a day (BID) | INTRAMUSCULAR | Status: DC

## 2016-10-12 MED ORDER — LORATADINE 10 MG PO TABS: 10.0000 mg | ORAL_TABLET | Freq: Every day | ORAL | Status: DC | PRN

## 2016-10-12 MED ORDER — MAGNESIUM CITRATE PO SOLN
1.0000 | Freq: Once | ORAL | Status: AC | PRN
  Administered 2016-10-14: 1 via ORAL
  Filled 2016-10-12: qty 296
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
## 2016-10-12 NOTE — ED Triage Notes (Signed)
Pt c/o SOB, with a SPO2 79% on RA, Hx of COPD, pt states she got a breathing treatment PTA with no relief. C/o 10/10 HA.

## 2016-10-12 NOTE — ED Notes (Signed)
Pt still reporting HA, states that her Percocet at home didn't help either and she wants something stronger.

## 2016-10-12 NOTE — ED Notes (Signed)
Attending paged for admit order.

## 2016-10-12 NOTE — Progress Notes (Signed)
Pharmacy Antibiotic Note  [name] is a 56 y.o. female admitted on 10/12/2016 with SOB and wheezing. Pharmacy has been consulted for Levaquin dosing for COPD exacerbation with hypoxia.  Patient is also positive for Flu A.  Baseline labs reviewed.   Plan: - LVQ 750mg  IV Q24H - Tamiflu x 5 days per MD - Pharmacy will sign off and follow peripherally to change med to PO.  Thank you for the consult!   Height: 5\' 2"  (157.5 cm) Weight: 239 lb (108.4 kg) IBW/kg (Calculated) : 50.1  Temp (24hrs), Avg:99.6 F (37.6 C), Min:99.6 F (37.6 C), Max:99.6 F (37.6 C)   Recent Labs Lab 10/12/16 0308  WBC 8.6  CREATININE 0.77    Estimated Creatinine Clearance: 89.9 mL/min (by C-G formula based on SCr of 0.77 mg/dL).    Allergies  Allergen Reactions  . Amoxicillin Nausea Only    6/14- states has taken Amoxicillin and no reaction.   . Clindamycin/Lincomycin Nausea And Vomiting  . Doxycycline Nausea And Vomiting and Nausea Only  . Treximet [Sumatriptan-Naproxen Sodium] Nausea And Vomiting and Nausea Only  . Bactrim Hives and Rash    LVQ 1/28 >> Tamiflu 1/28 >>  1/28 Flu A - positive   Morton Simson D. Mina Marble, PharmD, BCPS Pager:  [phone] 10/12/2016, 9:47 AM

## 2016-10-13 DIAGNOSIS — F17210 Nicotine dependence, cigarettes, uncomplicated: Secondary | ICD-10-CM | POA: Diagnosis present

## 2016-10-13 DIAGNOSIS — M5442 Lumbago with sciatica, left side: Secondary | ICD-10-CM | POA: Diagnosis present

## 2016-10-13 DIAGNOSIS — J441 Chronic obstructive pulmonary disease with (acute) exacerbation: Secondary | ICD-10-CM | POA: Diagnosis present

## 2016-10-13 DIAGNOSIS — Z7984 Long term (current) use of oral hypoglycemic drugs: Secondary | ICD-10-CM | POA: Diagnosis not present

## 2016-10-13 DIAGNOSIS — Z9119 Patient's noncompliance with other medical treatment and regimen: Secondary | ICD-10-CM | POA: Diagnosis not present

## 2016-10-13 DIAGNOSIS — Z86711 Personal history of pulmonary embolism: Secondary | ICD-10-CM | POA: Diagnosis not present

## 2016-10-13 DIAGNOSIS — I10 Essential (primary) hypertension: Secondary | ICD-10-CM | POA: Diagnosis present

## 2016-10-13 DIAGNOSIS — G43909 Migraine, unspecified, not intractable, without status migrainosus: Secondary | ICD-10-CM | POA: Diagnosis present

## 2016-10-13 DIAGNOSIS — K219 Gastro-esophageal reflux disease without esophagitis: Secondary | ICD-10-CM | POA: Diagnosis present

## 2016-10-13 DIAGNOSIS — I2510 Atherosclerotic heart disease of native coronary artery without angina pectoris: Secondary | ICD-10-CM | POA: Diagnosis present

## 2016-10-13 DIAGNOSIS — G894 Chronic pain syndrome: Secondary | ICD-10-CM | POA: Diagnosis present

## 2016-10-13 DIAGNOSIS — K5909 Other constipation: Secondary | ICD-10-CM | POA: Diagnosis present

## 2016-10-13 DIAGNOSIS — Z823 Family history of stroke: Secondary | ICD-10-CM | POA: Diagnosis not present

## 2016-10-13 DIAGNOSIS — R0602 Shortness of breath: Secondary | ICD-10-CM | POA: Diagnosis present

## 2016-10-13 DIAGNOSIS — Z8249 Family history of ischemic heart disease and other diseases of the circulatory system: Secondary | ICD-10-CM | POA: Diagnosis not present

## 2016-10-13 DIAGNOSIS — J9621 Acute and chronic respiratory failure with hypoxia: Secondary | ICD-10-CM | POA: Diagnosis present

## 2016-10-13 DIAGNOSIS — F419 Anxiety disorder, unspecified: Secondary | ICD-10-CM | POA: Diagnosis present

## 2016-10-13 DIAGNOSIS — K7460 Unspecified cirrhosis of liver: Secondary | ICD-10-CM | POA: Diagnosis present

## 2016-10-13 DIAGNOSIS — J101 Influenza due to other identified influenza virus with other respiratory manifestations: Secondary | ICD-10-CM | POA: Diagnosis present

## 2016-10-13 DIAGNOSIS — G4733 Obstructive sleep apnea (adult) (pediatric): Secondary | ICD-10-CM | POA: Diagnosis present

## 2016-10-13 DIAGNOSIS — E785 Hyperlipidemia, unspecified: Secondary | ICD-10-CM | POA: Diagnosis present

## 2016-10-13 DIAGNOSIS — M4310 Spondylolisthesis, site unspecified: Secondary | ICD-10-CM | POA: Diagnosis present

## 2016-10-13 DIAGNOSIS — Z6841 Body Mass Index (BMI) 40.0 and over, adult: Secondary | ICD-10-CM | POA: Diagnosis not present

## 2016-10-13 DIAGNOSIS — E119 Type 2 diabetes mellitus without complications: Secondary | ICD-10-CM | POA: Diagnosis present

## 2016-10-13 LAB — COMPREHENSIVE METABOLIC PANEL
ALBUMIN: 2.9 g/dL — AB (ref 3.5–5.0)
ALT: 22 U/L (ref 14–54)
ANION GAP: 8 (ref 5–15)
AST: 45 U/L — ABNORMAL HIGH (ref 15–41)
Alkaline Phosphatase: 89 U/L (ref 38–126)
BILIRUBIN TOTAL: 0.2 mg/dL — AB (ref 0.3–1.2)
BUN: 7 mg/dL (ref 6–20)
CALCIUM: 7.9 mg/dL — AB (ref 8.9–10.3)
CO2: 30 mmol/L (ref 22–32)
Chloride: 95 mmol/L — ABNORMAL LOW (ref 101–111)
Creatinine, Ser: 0.71 mg/dL (ref 0.44–1.00)
GFR calc Af Amer: >60 mL/min (ref >60)
GLUCOSE: 143 mg/dL — AB (ref 65–99)
Potassium: 4.6 mmol/L (ref 3.5–5.1)
Sodium: 133 mmol/L — ABNORMAL LOW (ref 135–145)
TOTAL PROTEIN: 7.3 g/dL (ref 6.5–8.1)

## 2016-10-13 LAB — CBC
HCT: 35.4 % — ABNORMAL LOW (ref 36.0–46.0)
Hemoglobin: 10.9 g/dL — ABNORMAL LOW (ref 12.0–15.0)
MCH: 25.8 pg — ABNORMAL LOW (ref 26.0–34.0)
MCHC: 30.8 g/dL (ref 30.0–36.0)
MCV: 83.9 fL (ref 78.0–100.0)
Platelets: 149 10*3/uL — ABNORMAL LOW (ref 150–400)
RBC: 4.22 MIL/uL (ref 3.87–5.11)
RDW: 15.4 % (ref 11.5–15.5)
WBC: 11.4 10*3/uL — ABNORMAL HIGH (ref 4.0–10.5)

## 2016-10-13 LAB — HEMOGLOBIN A1C
Hgb A1c MFr Bld: 5.9 % — ABNORMAL HIGH (ref 4.8–5.6)
MEAN PLASMA GLUCOSE: 123 mg/dL

## 2016-10-13 LAB — GLUCOSE, CAPILLARY
GLUCOSE-CAPILLARY: 290 mg/dL — AB (ref 65–99)
GLUCOSE-CAPILLARY: 278 mg/dL — AB (ref 65–99)
Glucose-Capillary: 213 mg/dL — ABNORMAL HIGH (ref 65–99)
Glucose-Capillary: 333 mg/dL — ABNORMAL HIGH (ref 65–99)

## 2016-10-13 LAB — TROPONIN I

## 2016-10-13 MED ORDER — MOMETASONE FURO-FORMOTEROL FUM 200-5 MCG/ACT IN AERO
2.0000 | INHALATION_SPRAY | Freq: Two times a day (BID) | RESPIRATORY_TRACT | Status: DC
  Administered 2016-10-13 – 2016-10-14 (×3): 2 via RESPIRATORY_TRACT
  Filled 2016-10-13: qty 8.8

## 2016-10-13 MED ORDER — POLYETHYLENE GLYCOL 3350 17 G PO PACK
17.0000 g | PACK | Freq: Every day | ORAL | Status: DC
  Administered 2016-10-13 – 2016-10-14 (×2): 17 g via ORAL
  Filled 2016-10-13 (×2): qty 1

## 2016-10-13 MED ORDER — SENNOSIDES-DOCUSATE SODIUM 8.6-50 MG PO TABS
1.0000 | ORAL_TABLET | Freq: Two times a day (BID) | ORAL | Status: DC
  Administered 2016-10-13 – 2016-10-14 (×3): 1 via ORAL
  Filled 2016-10-13 (×3): qty 1

## 2016-10-13 MED ORDER — OXYCODONE HCL 5 MG PO TABS
10.0000 mg | ORAL_TABLET | Freq: Once | ORAL | Status: AC
  Administered 2016-10-13: 10 mg via ORAL
  Filled 2016-10-13: qty 2

## 2016-10-13 MED ORDER — CYCLOBENZAPRINE HCL 10 MG PO TABS
5.0000 mg | ORAL_TABLET | Freq: Three times a day (TID) | ORAL | Status: DC
  Administered 2016-10-13 – 2016-10-14 (×4): 5 mg via ORAL
  Filled 2016-10-13 (×4): qty 1

## 2016-10-13 MED ORDER — LEVOFLOXACIN 500 MG PO TABS
750.0000 mg | ORAL_TABLET | Freq: Every day | ORAL | Status: DC
  Administered 2016-10-14: 750 mg via ORAL
  Filled 2016-10-13: qty 2

## 2016-10-13 MED ORDER — CYCLOBENZAPRINE HCL 5 MG PO TABS
7.5000 mg | ORAL_TABLET | Freq: Three times a day (TID) | ORAL | Status: DC
  Administered 2016-10-13: 7.5 mg via ORAL
  Filled 2016-10-13: qty 1.5

## 2016-10-13 NOTE — Progress Notes (Signed)
Patient c/o chest tightness and nausea. MD aware, new orders placed. VSS, will continue to monitor patient.

## 2016-10-13 NOTE — Progress Notes (Addendum)
[facility] Progress Note  Patient: [name] EXN:[id number]   PCP: Shirline Frees, MD DOB: [birth year]   DOA: 10/12/2016   DOS: 10/13/2016   Date of Service: the patient was seen and examined on 10/13/2016  Brief hospital course: Pt. with PMH of CAD, COPD, HTN, morbid obesity, chronic pain syndrome, sleep apnea noncompliant with C Pap, chronic respiratory failure noncompliant with oxygen; admitted on 10/12/2016, with complaint of cough and shortness of breath, was found to have mild COPD flare up with influenza A. Currently further plan is supportive management.  Assessment and Plan: 1. COPD exacerbation, influenza A. Acute on chronic respi failure with hypoxia., Patient is requiring 2 L of oxygen at present. Was suppose to be on oxygen at home chronically as well. Attempting to wean the oxygen. Continue IV Solu-Medrol, Levaquin, Tamiflu. Continue Mucinex. Patient may require oxygen at the time of the discharge,  case management consulted.  2. Chronic pain syndrome. Patient is a very high dose of narcotics at home. Currently mentions that she has worsening of her abdominal pain after using incentive spirometry and is requesting IV Dilaudid. On examination her abdomen looks benign and recommended not to use any IV narcotic pain medication here in the hospital. We give her a one-time extra dose of her home oxycodone. Avoid further escalation avoid further re-dosing off home regimen. As clearly mentioned to patient as well. Changing home Flexeril to half dose but and scheduled format and also placing the patient on scheduled MiraLAX.  3. Type 2 diabetes mellitus. Hemoglobin A1c currently pending. Monitor on sliding scale insulin.  4. Morbid obesity, obstructive sleep apnea. Patient noncompliant with C Pap at home. Mentions that has not received her BiPAP even though her diagnosis was many years ago! We will likely need oxygen on discharge.  5. Hypertension. Blood pressure well  controlled. Continuing home regimen.  Bowel regimen: last BM prior to admission Diet: carb modified DVT Prophylaxis: subcutaneous Heparin  Advance goals of care discussion: full code  Family Communication: family was present at bedside, at the time of interview. The pt provided permission to discuss medical plan with the family. Opportunity was given to ask question and all questions were answered satisfactorily.   Disposition:  Discharge to home. Expected discharge date: 10/14/2016, stablization of symptoms  Consultants: none Procedures: none  Antibiotics: Anti-infectives    Start     Dose/Rate Route Frequency Ordered Stop   10/14/16 1000  levofloxacin (LEVAQUIN) tablet 750 mg     750 mg Oral Daily 10/13/16 1313     10/12/16 2200  oseltamivir (TAMIFLU) capsule 75 mg     75 mg Oral 2 times daily 10/12/16 0925 10/17/16 0959   10/12/16 1000  levofloxacin (LEVAQUIN) IVPB 750 mg  Status:  Discontinued     750 mg 100 mL/hr over 90 Minutes Intravenous Every 24 hours 10/12/16 0948 10/13/16 1313   10/12/16 1000  oseltamivir (TAMIFLU) capsule 75 mg     75 mg Oral  Once 10/12/16 0949 10/12/16 1325        Subjective: Feeling better, complains about abdominal pain, nausea or vomiting. No diarrhea.  Objective: Physical Exam: Vitals:   10/13/16 0711 10/13/16 0932 10/13/16 0933 10/13/16 1333  BP: 126/72   120/66  Pulse: 82   98  Resp: 18   17  Temp: (!) 95.4 F (35.2 C)   97.7 F (36.5 C)  TempSrc: Oral   Oral  SpO2: 95% 100% 100% 99%  Weight:      Height:  Intake/Output Summary (Last 24 hours) at 10/13/16 1512 Last data filed at 10/13/16 1335  Gross per 24 hour  Intake              960 ml  Output                0 ml  Net              960 ml   Filed Weights   10/12/16 0307  Weight: 108.4 kg (239 lb)    General: Alert, Awake and Oriented to Time, Place and Person. Appear in mild distress, affect appropriate Eyes: PERRL, Conjunctiva normal ENT: Oral Mucosa clear  moist. Neck: no JVD, no Abnormal Mass Or lumps Cardiovascular: S1 and S2 Present, no Murmur, Respiratory: Bilateral Air entry equal and Decreased, no use of accessory muscle, no Crackles, bilateral wheezes Abdomen: Bowel Sound present, Soft and no tenderness Skin: no redness, no Rash, no induration Extremities: no Pedal edema, no calf tenderness Neurologic: Grossly no focal neuro deficit. Bilaterally Equal motor strength  Data Reviewed: CBC:  Recent Labs Lab 10/12/16 0308 10/13/16 0410  WBC 8.6 11.4*  HGB 12.0 10.9*  HCT 39.2 35.4*  MCV 84.8 83.9  PLT 148* 470*   Basic Metabolic Panel:  Recent Labs Lab 10/12/16 0308 10/13/16 0410  NA 133* 133*  K 3.9 4.6  CL 96* 95*  CO2 26 30  GLUCOSE 150* 143*  BUN 6 7  CREATININE 0.77 0.71  CALCIUM 8.1* 7.9*    Liver Function Tests:  Recent Labs Lab 10/13/16 0410  AST 45*  ALT 22  ALKPHOS 89  BILITOT 0.2*  PROT 7.3  ALBUMIN 2.9*   No results for input(s): LIPASE, AMYLASE in the last 168 hours. No results for input(s): AMMONIA in the last 168 hours. Coagulation Profile: No results for input(s): INR, PROTIME in the last 168 hours. Cardiac Enzymes:  Recent Labs Lab 10/12/16 0556  TROPONINI <0.03   BNP (last 3 results) No results for input(s): PROBNP in the last 8760 hours.  CBG:  Recent Labs Lab 10/12/16 1150 10/12/16 1648 10/12/16 2126 10/13/16 0709 10/13/16 1207  GLUCAP 176* 200* 155* 213* 290*    Studies: No results found.   Scheduled Meds: . ALPRAZolam  1 mg Oral q morning - 10a  . ALPRAZolam  2 mg Oral QHS  . alum & mag hydroxide-simeth  10 mL Oral QID  . amLODipine  5 mg Oral Daily  . cyclobenzaprine  5 mg Oral TID  . enoxaparin (LOVENOX) injection  0.5 mg/kg Subcutaneous Q24H  . escitalopram  10 mg Oral QHS  . estradiol  2 mg Oral Daily  . insulin aspart  0-9 Units Subcutaneous TID WC  . ipratropium-albuterol  3 mL Nebulization TID  . [START ON 10/14/2016] levofloxacin  750 mg Oral Daily    . methylPREDNISolone (SOLU-MEDROL) injection  60 mg Intravenous Q12H  . mometasone-formoterol  2 puff Inhalation BID  . nicotine  14 mg Transdermal Daily  . oseltamivir  75 mg Oral BID  . pantoprazole  40 mg Oral Daily  . polyethylene glycol  17 g Oral Daily  . senna-docusate  1 tablet Oral BID  . simvastatin  20 mg Oral QHS  . sucralfate  1 g Oral TID WC & HS   Continuous Infusions: PRN Meds: acetaminophen **OR** acetaminophen, albuterol, fluticasone, guaiFENesin, lactulose, loratadine, magnesium citrate, olopatadine, ondansetron **OR** ondansetron (ZOFRAN) IV, oxyCODONE, promethazine, simethicone  Time spent: 30 minutes  Author: Berle Mull, MD [facility] Pager:  [phone] 10/13/2016 3:12 PM  If 7PM-7AM, please contact night-coverage at [email or web address], password [facility]

## 2016-10-13 NOTE — Progress Notes (Signed)
Inpatient Diabetes Program Recommendations  AACE/ADA: New Consensus Statement on Inpatient Glycemic Control (2015)  Target Ranges:  Prepandial:   less than 140 mg/dL      Peak postprandial:   less than 180 mg/dL (1-2 hours)      Critically ill patients:  140 - 180 mg/dL   Results for [name], [name] (MRN [id number]) as of 10/13/2016 12:55  Ref. Range 10/13/2016 07:09 10/13/2016 12:07  Glucose-Capillary Latest Ref Range: 65 - 99 mg/dL 213 (H) 290 (H)    Admit SOB.   History: DM, COPD  Home DM Meds: Metformin 1000 mg BID  Current Orders: Novolog Sensitive Correction Scale/ SSI (0-9 units) TID AC      MD- Note patient getting Solumedrol 60 mg BID.  CBGs quite elevated likely due to steroids.  Current A1c shows good control at home (current A1c= 5.9%).  Please consider the following in-hospital insulin adjustments while patient getting steroids:  1. Start Lantus 10 units daily  2. Increase Novolog Correction Scale/ SSI to Moderate scale (0-15 units) TID AC + HS     --Will follow patient during hospitalization--  Wyn Quaker RN, MSN, CDE Diabetes Coordinator Inpatient Glycemic Control Team Team Pager: [phone] (8a-5p)

## 2016-10-14 LAB — CBC
HCT: 35.9 % — ABNORMAL LOW (ref 36.0–46.0)
Hemoglobin: 10.8 g/dL — ABNORMAL LOW (ref 12.0–15.0)
MCH: 25.7 pg — AB (ref 26.0–34.0)
MCHC: 30.1 g/dL (ref 30.0–36.0)
MCV: 85.3 fL (ref 78.0–100.0)
PLATELETS: 159 10*3/uL (ref 150–400)
RBC: 4.21 MIL/uL (ref 3.87–5.11)
RDW: 15.7 % — AB (ref 11.5–15.5)
WBC: 11.7 10*3/uL — ABNORMAL HIGH (ref 4.0–10.5)

## 2016-10-14 LAB — BASIC METABOLIC PANEL
Anion gap: 7 (ref 5–15)
BUN: 8 mg/dL (ref 6–20)
CALCIUM: 8.2 mg/dL — AB (ref 8.9–10.3)
CO2: 34 mmol/L — ABNORMAL HIGH (ref 22–32)
Chloride: 93 mmol/L — ABNORMAL LOW (ref 101–111)
Creatinine, Ser: 0.65 mg/dL (ref 0.44–1.00)
GFR calc Af Amer: >60 mL/min (ref >60)
GLUCOSE: 347 mg/dL — AB (ref 65–99)
POTASSIUM: 6 mmol/L — AB (ref 3.5–5.1)
SODIUM: 134 mmol/L — AB (ref 135–145)

## 2016-10-14 LAB — GLUCOSE, CAPILLARY
Glucose-Capillary: 261 mg/dL — ABNORMAL HIGH (ref 65–99)
Glucose-Capillary: 288 mg/dL — ABNORMAL HIGH (ref 65–99)

## 2016-10-14 LAB — POTASSIUM: Potassium: 5.1 mmol/L (ref 3.5–5.1)

## 2016-10-14 MED ORDER — NICOTINE 14 MG/24HR TD PT24: 14.0000 mg | MEDICATED_PATCH | Freq: Every day | TRANSDERMAL | 0 refills | Status: DC

## 2016-10-14 MED ORDER — GUAIFENESIN ER 600 MG PO TB12: 600.0000 mg | ORAL_TABLET | Freq: Two times a day (BID) | ORAL | 0 refills | Status: DC

## 2016-10-14 MED ORDER — OSELTAMIVIR PHOSPHATE 75 MG PO CAPS
75.0000 mg | ORAL_CAPSULE | Freq: Two times a day (BID) | ORAL | 0 refills | Status: AC
Start: 2016-10-14 — End: 2016-10-17

## 2016-10-14 MED ORDER — LEVOFLOXACIN 750 MG PO TABS: 750.0000 mg | ORAL_TABLET | Freq: Every day | ORAL | 0 refills | Status: AC

## 2016-10-14 MED ORDER — SENNOSIDES-DOCUSATE SODIUM 8.6-50 MG PO TABS: 1.0000 | ORAL_TABLET | Freq: Every day | ORAL | 0 refills | Status: DC

## 2016-10-14 MED ORDER — PREDNISONE 10 MG PO TABS: ORAL_TABLET | ORAL | 0 refills | Status: DC

## 2016-10-14 MED ORDER — PREDNISONE 20 MG PO TABS
50.0000 mg | ORAL_TABLET | Freq: Every day | ORAL | Status: DC
  Administered 2016-10-14: 09:00:00 50 mg via ORAL
  Filled 2016-10-14: qty 2

## 2016-10-14 MED ORDER — ENOXAPARIN SODIUM 60 MG/0.6ML ~~LOC~~ SOLN
0.5000 mg/kg | SUBCUTANEOUS | Status: DC
  Administered 2016-10-14: 60 mg via SUBCUTANEOUS
  Filled 2016-10-14: qty 0.6

## 2016-10-14 NOTE — Plan of Care (Signed)
Problem: Acute Rehab PT Goals(only PT should resolve) Goal: Pt Will Ambulate Supplemental oxygen     

## 2016-10-14 NOTE — Progress Notes (Signed)
SATURATION QUALIFICATIONS: (This note is used to comply with regulatory documentation for home oxygen)  Patient Saturations on Room Air at Rest = 95%  Patient Saturations on Room Air while Ambulating = 85%  Patient Saturations on 2 Liters of oxygen while Ambulating = 96%  Please briefly explain why patient needs home oxygen: Patient's oxygen desaturated on room air.

## 2016-10-14 NOTE — Progress Notes (Signed)
All discharge teachings done both written and verbal with patient. All questions answered. Pt verb understanding of all teachings and agrees to comply.

## 2016-10-14 NOTE — Progress Notes (Signed)
Prior to ambulation patient was 95% on room air, during ambulation patient's oxygen desaturated to 85% on room air. Applied 2L of oxygen and patient's oxygen saturations came up to 96% on 2L while ambulating.

## 2016-10-14 NOTE — Care Management Note (Signed)
Case Management Note  Patient Details  Name: [name] MRN: [id number] Date of Birth: 07-29-1961  Subjective/Objective: 56 yr old female admitted with COPD exacerbation and Influenza A.   Action/Plan: Case manager spoke with patient concerning need for Home oxygen. Patient has used [facility] in the past.    Expected Discharge Date:  10/14/16               Expected Discharge Plan:  Home/Self Care  In-House Referral:  NA  Discharge planning Services  CM Consult  Post Acute Care Choice:  Durable Medical Equipment Choice offered to:  Patient  DME Arranged:  Oxygen DME Agency:  [place]:  NA [place] [facility]:     Status of Service:  Completed, signed off  If discussed at [place] of Stay Meetings, dates discussed:    Additional Comments:  Ninfa Meeker, RN 10/14/2016, 1:29 PM

## 2016-10-14 NOTE — Discharge Summary (Signed)
[facility] Discharge Summary   Patient: [name] [id number]   PCP: Shirline Frees, MD DOB: Apr 25, 1961   Date of admission: 10/12/2016   Date of discharge:  10/14/2016    Discharge Diagnoses:  Active Problems:   Chronic pain   Tobacco abuse   CAD (coronary artery disease)   Hypertension   COPD (chronic obstructive pulmonary disease) ([name])   Respiratory failure, acute-on-chronic ([name])   Spondylolisthesis, grade 2   Sleep apnea   Morbid obesity ([place])   Diabetes ([place])   Cirrhosis of liver: per CT   Migraine headache   Chronic low back pain with left-sided sciatica   COPD exacerbation ([place])   Influenza A   Admitted From: home Disposition:  Home with home health  Recommendations for Outpatient Follow-up:  1. Follow-up with PCP in one week   Follow-up Information    Shirline Frees, MD. Schedule an appointment as soon as possible for a visit in 1 week(s).   Specialty:  Family Medicine Contact information: [place] [id number] [phone]          Diet recommendation: Carb modified and cardiac diet  Activity: The patient is advised to gradually reintroduce usual activities.  Discharge Condition: good  Code Status: Full code  History of present illness: As per the H and P dictated on admission, "[name] is a 56 y.o. female with extensive medical history including COPD, tobacco abuse, asthma, CAD, DM, migraines, HTN, depression, HLD , presenting tot the ED with constant, severe shortness of breath and wheezing  since yesterday.  She tried her home breathing treatments without relief. She reports nonproductive cough, headache, and significant wheezing as mentioned above. She also reported a fever for about one day, temperature maximum is 102.3. She also reported intermittent nausea. She denies any chills or night sweats. She denies any myalgias. Of note, the patient was not on oxygen at home. She reports sick contacts, with  her husband having "flu virus" and has also had contact with sick grandchild . Denies chest pain or palpitations. Denies abdominal pain, vomiting or diarrhea. She has chronic constipation. She denies any dysuria or gross hematuria. She denies any lower extremity swelling or calf pain. She has difficulty ambulating due to shortness of breath. Was desaturated in the 80s on admission requiring 2 L, which she continues to need "  Hospital Course:   Summary of her active problems in the hospital is as following. 1. COPD exacerbation, influenza A. Acute on chronic respi failure with hypoxia., Patient is requiring 2 L of oxygen at present. Was suppose to be on oxygen at home chronically as well. Attempting to wean the oxygen. Continue oral prednisone, Levaquin, Tamiflu. Continue Mucinex.  Discharge on home oxygen  2. Chronic pain syndrome. Patient has been requesting IV Dilaudid and Doses of oral Percocets during the hospitalization for her chronic pain. Continuing home regimen on discharge without any changes.  3. Type 2 diabetes mellitus. Hemoglobin A1c 5.9 Continue home regimen  4. Morbid obesity, obstructive sleep apnea. Patient noncompliant with C Pap at home. Mentions that has not received her BiPAP even though her diagnosis was many years ago! need oxygen on discharge.  5. Hypertension. Blood pressure well controlled. Continuing home regimen.  All other chronic medical condition were stable during the hospitalization.  Patient was seen by physical therapy, who recommended no further therapy, home health and home oxygen was arranged by Education officer, museum and case Freight forwarder. On the day of the discharge  the patient's vitals were stable , and no other acute medical condition were reported by patient. the patient was felt safe to be discharge at home with home health.  Procedures and Results:  none   Consultations:  none  DISCHARGE MEDICATION: Current Discharge Medication List      START taking these medications   Details  guaiFENesin (MUCINEX) 600 MG 12 hr tablet Take 1 tablet (600 mg total) by mouth 2 (two) times daily. Qty: 30 tablet, Refills: 0    levofloxacin (LEVAQUIN) 750 MG tablet Take 1 tablet (750 mg total) by mouth daily. Qty: 2 tablet, Refills: 0    nicotine (NICODERM CQ - DOSED IN MG/24 HOURS) 14 mg/24hr patch Place 1 patch (14 mg total) onto the skin daily. Qty: 28 patch, Refills: 0    oseltamivir (TAMIFLU) 75 MG capsule Take 1 capsule (75 mg total) by mouth 2 (two) times daily. Qty: 6 capsule, Refills: 0    predniSONE (DELTASONE) 10 MG tablet Take 57m daily for 3days,Take 452mdaily for 3days,Take 3044maily for 3days,Take 21m64mily for 3days,Take 10mg40mly for 3days, then stop Qty: 45 tablet, Refills: 0    senna-docusate (SENOKOT-S) 8.6-50 MG tablet Take 1 tablet by mouth at bedtime. Qty: 30 tablet, Refills: 0      CONTINUE these medications which have NOT CHANGED   Details  albuterol (PROVENTIL HFA;VENTOLIN HFA) 108 (90 BASE) MCG/ACT inhaler Inhale 2 puffs into the lungs every 4 (four) hours as needed. Shortness of breath/wheezing.    ALPRAZolam (XANAX) 1 MG tablet Take 1-2 mg by mouth 2 (two) times daily. Take 1 mg in the morning and 2 mg at bedtime.    alum & mag hydroxide-simeth (MAALOX REGULAR STRENGTH) 200-200-20 MG/5ML suspension Take 10 mLs by mouth 4 (four) times daily. Qty: 355 mL, Refills: 0    amLODipine (NORVASC) 5 MG tablet Take 5 mg by mouth daily.    benazepril (LOTENSIN) 40 MG tablet Take 40 mg by mouth daily.    bumetanide (BUMEX) 1 MG tablet Take 1-2 mg by mouth daily as needed (edema).     cyclobenzaprine (FLEXERIL) 10 MG tablet Take 10 mg by mouth 3 (three) times daily as needed for muscle spasms.    docusate sodium (COLACE) 100 MG capsule Take 1 capsule (100 mg total) by mouth 2 (two) times daily. Qty: 30 capsule, Refills: 0    escitalopram (LEXAPRO) 10 MG tablet Take 10 mg by mouth at bedtime.     esomeprazole (NEXIUM) 40 MG capsule Take 40 mg by mouth 2 (two) times daily.     estradiol (ESTRACE) 2 MG tablet Take 2 mg by mouth daily.     lactulose (CEPHULAC) 10 G packet Take 1 packet (10 g total) by mouth 2 (two) times daily as needed (constipation). Qty: 30 each, Refills: 0    lidocaine (LIDODERM) 5 % Place 1 patch onto the skin daily as needed. Reported on 10/15/2015    lidocaine (XYLOCAINE) 5 % ointment Apply 1 application topically daily. To hemmorroids    loratadine (CLARITIN) 10 MG tablet Take 1 tablet (10 mg total) by mouth daily. Qty: 30 tablet, Refills: 0    metFORMIN (GLUCOPHAGE) 500 MG tablet Take 2 tablets (1,000 mg total) by mouth 2 (two) times daily with a meal.    mometasone (NASONEX) 50 MCG/ACT nasal spray Place 2 sprays into the nose daily. Qty: 17 g, Refills: 12    nitroGLYCERIN (NITROSTAT) 0.4 MG SL tablet Place 0.4 mg under the tongue every 5 (five)  minutes as needed for chest pain.    nystatin (NYSTOP) 100000 UNIT/GM POWD Apply 1 g topically 3 (three) times daily as needed. To rashy areas    olopatadine (PATANOL) 0.1 % ophthalmic solution Place 1 drop into both eyes as needed for allergies.     OVER THE COUNTER MEDICATION Place 1 spray into both nostrils as needed (menthol nasal spray).    Oxycodone HCl 10 MG TABS Take 10 mg by mouth every 4 (four) hours as needed.    polyethylene glycol (MIRALAX / GLYCOLAX) packet Take 17 g by mouth 2 (two) times daily. Qty: 28 each, Refills: 0    potassium chloride (K-DUR) 10 MEQ tablet Take 10 mEq by mouth as needed (when leg cramps). Only takes it when needed.  Patient states she can tell when her potassium is low when she cramps and will take 1 tablet.    promethazine (PHENERGAN) 25 MG tablet Take 25 mg by mouth every 6 (six) hours as needed. nausea    rizatriptan (MAXALT) 10 MG tablet Take 10 mg by mouth daily as needed for migraine. May repeat in 2 hours if needed    Simethicone (PHAZYME) 180 MG CAPS Take 1  capsule by mouth 4 (four) times daily as needed. For gas/bloating    simvastatin (ZOCOR) 20 MG tablet Take 20 mg by mouth at bedtime.    sucralfate (CARAFATE) 1 g tablet Take 1 g by mouth 4 (four) times daily -  with meals and at bedtime.       Allergies  Allergen Reactions  . Amoxicillin Nausea Only    6/14- states has taken Amoxicillin and no reaction.   . Clindamycin/Lincomycin Nausea And Vomiting  . Doxycycline Nausea And Vomiting and Nausea Only  . Treximet [Sumatriptan-Naproxen Sodium] Nausea And Vomiting and Nausea Only  . Bactrim Hives and Rash   Discharge Instructions    Diet - low sodium heart healthy    Complete by:  As directed    Diet Carb Modified    Complete by:  As directed    Discharge instructions    Complete by:  As directed    It is important that you read following instructions as well as go over your medication list with RN to help you understand your care after this hospitalization.  Discharge Instructions: Please follow-up with PCP in one week  Please request your primary care physician to go over all Hospital Tests and Procedure/Radiological results at the follow up,  Please get all Hospital records sent to your PCP by signing hospital release before you go home.   Do not take more than prescribed Pain, Sleep and Anxiety Medications. You were cared for by a hospitalist during your hospital stay. If you have any questions about your discharge medications or the care you received while you were in the hospital after you are discharged, you can call the unit and ask to speak with the hospitalist on call if the hospitalist that took care of you is not available.  Once you are discharged, your primary care physician will handle any further medical issues. Please note that NO REFILLS for any discharge medications will be authorized once you are discharged, as it is imperative that you return to your primary care physician (or establish a relationship with a  primary care physician if you do not have one) for your aftercare needs so that they can reassess your need for medications and monitor your lab values. You Must read complete instructions/literature along with all the  possible adverse reactions/side effects for all the Medicines you take and that have been prescribed to you. Take any new Medicines after you have completely understood and accept all the possible adverse reactions/side effects. Wear Seat belts while driving. If you have smoked or chewed Tobacco in the last 2 yrs please stop smoking and/or stop any Recreational drug use.   Increase activity slowly    Complete by:  As directed      Discharge Exam: Filed Weights   10/12/16 0307 10/14/16 0500  Weight: 108.4 kg (239 lb) 117.7 kg (259 lb 7.7 oz)   Vitals:   10/13/16 2028 10/14/16 0523  BP: 125/67 121/72  Pulse: 96 82  Resp:  18  Temp: 97.3 F (36.3 C) 97.5 F (36.4 C)   General: Appear in no distress, no Rash; Oral Mucosa moist. Cardiovascular: S1 and S2 Present, no Murmur, no JVD Respiratory: Bilateral Air entry present and no Crackles, Occasional wheezes Abdomen: Bowel Sound present, Soft and no tenderness Extremities: no Pedal edema, no calf tenderness Neurology: Grossly no focal neuro deficit.  The results of significant diagnostics from this hospitalization (including imaging, microbiology, ancillary and laboratory) are listed below for reference.    Significant Diagnostic Studies: Dg Chest 2 View  Result Date: 09/30/2016 CLINICAL DATA:  Preoperative respiratory evaluation. EXAM: CHEST  2 VIEW COMPARISON:  03/08/2015 FINDINGS: Chronic linear atelectasis or scarring left mid lung, unchanged. The lungs are clear wiithout focal pneumonia, edema, pneumothorax or pleural effusion. The cardiopericardial silhouette is within normal limits for size. The visualized bony structures of the thorax are intact. IMPRESSION: No active cardiopulmonary disease. Electronically Signed    By: Misty Stanley M.D.   On: 09/30/2016 13:07   Dg Chest Portable 1 View  Result Date: 10/12/2016 CLINICAL DATA:  Initial evaluation for acute hypoxia. EXAM: PORTABLE CHEST 1 VIEW COMPARISON:  Prior radiograph from 09/30/2016. FINDINGS: Mild cardiomegaly.  Mediastinal silhouette within normal limits. Lungs mildly hypoinflated. Streaky atelectatic changes present within the left lower lobe. There is mild perihilar vascular congestion without overt pulmonary edema. No pleural effusion. No other focal infiltrates. No pneumothorax. No acute osseus abnormality. IMPRESSION: 1. Mild cardiomegaly with perihilar vascular congestion without overt pulmonary edema. 2. Streaky left lower lobe atelectasis. 3. No other active cardiopulmonary disease identified. Electronically Signed   By: Jeannine Boga M.D.   On: 10/12/2016 05:29   Dg Ugi  W/kub  Result Date: 09/30/2016 CLINICAL DATA:  Morbid obesity.  Preop for gastric sleeve. EXAM: UPPER GI SERIES WITH KUB TECHNIQUE: After obtaining a scout radiograph a routine upper GI series was performed using thin barium FLUOROSCOPY TIME:  Fluoroscopy Time:  1 minutes 23 seconds Radiation Exposure Index (if provided by the fluoroscopic device): 58.31 mGy Number of Acquired Spot Images: 6 COMPARISON:  None. FINDINGS: KUB shows no obstructive changes, concerning calcification, or abnormal mass effect. Oblique pharyngeal imaging was normal. No obstruction or aspiration noted. Normal contour of the esophagus. Smooth mild narrowing at the GE junction consistent with stricture. The narrowing caused delay in passage of a 13 mm barium tablet. Normal gastric fold pattern when allowing for partial distention of the stomach. No gastric outlet obstruction or signs of mass. Normal duodenal C-loop. No hiatal hernia or visualized reflux. IMPRESSION: 1. Mild smooth stricture at the GE junction which delayed passage of 13 mm barium tablet. 2. Normal gastric anatomy.  Negative for hiatal  hernia. Electronically Signed   By: Monte Fantasia M.D.   On: 09/30/2016 11:18    Microbiology: Recent  Results (from the past 240 hour(s))  Respiratory Panel by PCR     Status: Abnormal   Collection Time: 10/12/16  4:10 PM  Result Value Ref Range Status   Adenovirus NOT DETECTED NOT DETECTED Final   Coronavirus 229E NOT DETECTED NOT DETECTED Final   Coronavirus HKU1 NOT DETECTED NOT DETECTED Final   Coronavirus NL63 NOT DETECTED NOT DETECTED Final   Coronavirus OC43 NOT DETECTED NOT DETECTED Final   Metapneumovirus NOT DETECTED NOT DETECTED Final   Rhinovirus / Enterovirus NOT DETECTED NOT DETECTED Final   Influenza A H3 DETECTED (A) NOT DETECTED Final   Influenza B NOT DETECTED NOT DETECTED Final   Parainfluenza Virus 1 NOT DETECTED NOT DETECTED Final   Parainfluenza Virus 2 NOT DETECTED NOT DETECTED Final   Parainfluenza Virus 3 NOT DETECTED NOT DETECTED Final   Parainfluenza Virus 4 NOT DETECTED NOT DETECTED Final   Respiratory Syncytial Virus NOT DETECTED NOT DETECTED Final   Bordetella pertussis NOT DETECTED NOT DETECTED Final   Chlamydophila pneumoniae NOT DETECTED NOT DETECTED Final   Mycoplasma pneumoniae NOT DETECTED NOT DETECTED Final     Labs: CBC:  Recent Labs Lab 10/12/16 0308 10/13/16 0410 10/14/16 0352  WBC 8.6 11.4* 11.7*  HGB 12.0 10.9* 10.8*  HCT 39.2 35.4* 35.9*  MCV 84.8 83.9 85.3  PLT 148* 149* 400   Basic Metabolic Panel:  Recent Labs Lab 10/12/16 0308 10/13/16 0410 10/14/16 0352 10/14/16 0847  NA 133* 133* 134*  --   K 3.9 4.6 6.0* 5.1  CL 96* 95* 93*  --   CO2 26 30 34*  --   GLUCOSE 150* 143* 347*  --   BUN 6 7 8   --   CREATININE 0.77 0.71 0.65  --   CALCIUM 8.1* 7.9* 8.2*  --    Liver Function Tests:  Recent Labs Lab 10/13/16 0410  AST 45*  ALT 22  ALKPHOS 89  BILITOT 0.2*  PROT 7.3  ALBUMIN 2.9*   No results for input(s): LIPASE, AMYLASE in the last 168 hours. No results for input(s): AMMONIA in the last 168  hours. Cardiac Enzymes:  Recent Labs Lab 10/12/16 0556 10/13/16 1638  TROPONINI <0.03 <0.03   BNP (last 3 results)  Recent Labs  10/12/16 0557  BNP 82.2   CBG:  Recent Labs Lab 10/13/16 1207 10/13/16 1759 10/13/16 2158 10/14/16 0635 10/14/16 1149  GLUCAP 290* 278* 333* 261* 288*   Time spent: 30 minutes  Signed:  Letesha Klecker  [facility]  10/14/2016  , 4:18 PM

## 2016-10-14 NOTE — Evaluation (Signed)
Physical Therapy Evaluation Patient Details Name: [name] MRN: [id number] DOB: Feb 05, 1961 Today's Date: 10/14/2016   History of Present Illness  is a 56 y.o. female with extensive medical history including COPD, tobacco abuse, asthma, CAD, DM, migraines, HTN, depression, HLD , presenting tot the ED with constant, severe shortness of breath and wheezing  since day before admission.  She had episode of chest pain and nausea today.  Clinical Impression  Pt's oxygen saturation WNL while using supplemental oxygen. Pt expressed financial concern regarding home oxygen, which is why she says she stopped using it before.  Pt's mobility limited by SOB, needs intermittent standing rest breaks to catch breath.  Will progress mobility while pt on this venue of care.    Follow Up Recommendations No PT follow up;Supervision - Intermittent (pt feels she does not need f/u therapy upon discharge)    Equipment Recommendations  Other (comment) (oxygen)    Recommendations for Other Services       Precautions / Restrictions Precautions Precautions: Other (comment) Precaution Comments: monitor oxygen saturation levels Restrictions Weight Bearing Restrictions: No      Mobility  Bed Mobility Overal bed mobility: Independent             General bed mobility comments: supine to/from sit  Transfers Overall transfer level: Independent Equipment used: None             General transfer comment: independent with toilet self on regular height commode and self care after  Ambulation/Gait Ambulation/Gait assistance: Supervision Ambulation Distance (Feet): 180 Feet Assistive device: None Gait Pattern/deviations: Decreased step length - right;Decreased step length - left;Wide base of support     General Gait Details: saturation level to 84% on RA after gait 100', maintains 98% on 2L via nasal cannula  Stairs            Wheelchair Mobility    Modified Rankin (Stroke Patients  Only)       Balance Overall balance assessment: Modified Independent                                           Pertinent Vitals/Pain Pain Assessment: No/denies pain    Home Living Family/patient expects to be discharged to:: Private residence Living Arrangements: Spouse/significant other Available Help at Discharge: Family;Available 24 hours/day Type of Home: House Home Access: Stairs to enter Entrance Stairs-Rails: Can reach both Entrance Stairs-Number of Steps: 3 Home Layout: One level Home Equipment: Walker - 2 wheels;Cane - single point;Bedside commode Additional Comments: used supplemental oxygen at home in past but not directly before admission    Prior Function Level of Independence: Independent         Comments: uses cane on occasion, otherwise, no device in home     Hand Dominance        Extremity/Trunk Assessment   Upper Extremity Assessment Upper Extremity Assessment: Overall WFL for tasks assessed    Lower Extremity Assessment Lower Extremity Assessment: Generalized weakness (needs assist to don socks-she cannot reach her feet )    Cervical / Trunk Assessment Cervical / Trunk Assessment: Kyphotic  Communication   Communication: No difficulties  Cognition Arousal/Alertness: Awake/alert Behavior During Therapy: WFL for tasks assessed/performed Overall Cognitive Status: Within Functional Limits for tasks assessed                      General Comments General comments (  skin integrity, edema, etc.): holds to rail in hall on occasion to rest during gait to catch her breath    Exercises     Assessment/Plan    PT Assessment Patient needs continued PT services  PT Problem List Decreased strength;Decreased range of motion;Decreased activity tolerance;Decreased mobility;Cardiopulmonary status limiting activity          PT Treatment Interventions Gait training;Stair training;Functional mobility training;Therapeutic  activities;Therapeutic exercise;Balance training;Patient/family education    PT Goals (Current goals can be found in the Care Plan section)  Acute Rehab PT Goals Patient Stated Goal: return home, breathe better PT Goal Formulation: With patient Time For Goal Achievement: 10/24/16 Potential to Achieve Goals: Good    Frequency Min 3X/week   Barriers to discharge Decreased caregiver support husband has been also recently ill with flu per pt    Co-evaluation               End of Session Equipment Utilized During Treatment: Gait belt;Oxygen Activity Tolerance: Patient tolerated treatment well Patient left: in bed;with call bell/phone within reach Nurse Communication: Mobility status;Precautions         Time: [id number] PT Time Calculation (min) (ACUTE ONLY): 27 min   Charges:     PT Treatments $Gait Training: 8-22 mins   PT G CodesSuszanne Finch, PT [phone]  [place] 10/14/2016, 11:47 AM

## 2016-10-22 ENCOUNTER — Other Ambulatory Visit: Payer: Self-pay | Admitting: Gastroenterology

## 2016-10-24 ENCOUNTER — Encounter (HOSPITAL_COMMUNITY): Payer: Self-pay

## 2016-10-29 ENCOUNTER — Encounter (HOSPITAL_COMMUNITY): Payer: Self-pay | Admitting: *Deleted

## 2016-10-29 ENCOUNTER — Encounter (HOSPITAL_COMMUNITY)
Admission: RE | Disposition: A | Discharge status: Home / Self Care | Payer: Self-pay | Source: Ambulatory Visit | Attending: Gastroenterology

## 2016-10-29 ENCOUNTER — Ambulatory Visit (HOSPITAL_COMMUNITY)
Admission: RE | Admit: 2016-10-29 | Discharge: 2016-10-29 | Disposition: A | Discharge status: Home / Self Care | Payer: Medicare Other | Source: Ambulatory Visit | Attending: Gastroenterology | Admitting: Gastroenterology

## 2016-10-29 ENCOUNTER — Telehealth: Payer: Self-pay

## 2016-10-29 ENCOUNTER — Ambulatory Visit (HOSPITAL_COMMUNITY): Payer: Medicare Other | Admitting: Certified Registered Nurse Anesthetist

## 2016-10-29 ENCOUNTER — Ambulatory Visit (HOSPITAL_COMMUNITY): Payer: Medicare Other

## 2016-10-29 DIAGNOSIS — Z7984 Long term (current) use of oral hypoglycemic drugs: Secondary | ICD-10-CM | POA: Diagnosis not present

## 2016-10-29 DIAGNOSIS — I10 Essential (primary) hypertension: Secondary | ICD-10-CM | POA: Diagnosis not present

## 2016-10-29 DIAGNOSIS — Z881 Allergy status to other antibiotic agents status: Secondary | ICD-10-CM | POA: Diagnosis not present

## 2016-10-29 DIAGNOSIS — M5442 Lumbago with sciatica, left side: Secondary | ICD-10-CM | POA: Insufficient documentation

## 2016-10-29 DIAGNOSIS — Z880 Allergy status to penicillin: Secondary | ICD-10-CM | POA: Diagnosis not present

## 2016-10-29 DIAGNOSIS — Z6841 Body Mass Index (BMI) 40.0 and over, adult: Secondary | ICD-10-CM | POA: Insufficient documentation

## 2016-10-29 DIAGNOSIS — Z7951 Long term (current) use of inhaled steroids: Secondary | ICD-10-CM | POA: Diagnosis not present

## 2016-10-29 DIAGNOSIS — J441 Chronic obstructive pulmonary disease with (acute) exacerbation: Secondary | ICD-10-CM | POA: Diagnosis not present

## 2016-10-29 DIAGNOSIS — G4730 Sleep apnea, unspecified: Secondary | ICD-10-CM | POA: Diagnosis not present

## 2016-10-29 DIAGNOSIS — Z86711 Personal history of pulmonary embolism: Secondary | ICD-10-CM | POA: Insufficient documentation

## 2016-10-29 DIAGNOSIS — J9610 Chronic respiratory failure, unspecified whether with hypoxia or hypercapnia: Secondary | ICD-10-CM | POA: Insufficient documentation

## 2016-10-29 DIAGNOSIS — J449 Chronic obstructive pulmonary disease, unspecified: Secondary | ICD-10-CM | POA: Insufficient documentation

## 2016-10-29 DIAGNOSIS — M1990 Unspecified osteoarthritis, unspecified site: Secondary | ICD-10-CM | POA: Diagnosis not present

## 2016-10-29 DIAGNOSIS — E119 Type 2 diabetes mellitus without complications: Secondary | ICD-10-CM | POA: Insufficient documentation

## 2016-10-29 DIAGNOSIS — G8929 Other chronic pain: Secondary | ICD-10-CM | POA: Diagnosis not present

## 2016-10-29 DIAGNOSIS — I2510 Atherosclerotic heart disease of native coronary artery without angina pectoris: Secondary | ICD-10-CM | POA: Diagnosis not present

## 2016-10-29 DIAGNOSIS — K222 Esophageal obstruction: Secondary | ICD-10-CM

## 2016-10-29 DIAGNOSIS — K7460 Unspecified cirrhosis of liver: Secondary | ICD-10-CM | POA: Diagnosis not present

## 2016-10-29 DIAGNOSIS — F17210 Nicotine dependence, cigarettes, uncomplicated: Secondary | ICD-10-CM | POA: Diagnosis not present

## 2016-10-29 DIAGNOSIS — Z79899 Other long term (current) drug therapy: Secondary | ICD-10-CM | POA: Insufficient documentation

## 2016-10-29 DIAGNOSIS — K219 Gastro-esophageal reflux disease without esophagitis: Secondary | ICD-10-CM | POA: Insufficient documentation

## 2016-10-29 DIAGNOSIS — E7800 Pure hypercholesterolemia, unspecified: Secondary | ICD-10-CM | POA: Insufficient documentation

## 2016-10-29 DIAGNOSIS — R1310 Dysphagia, unspecified: Secondary | ICD-10-CM | POA: Insufficient documentation

## 2016-10-29 HISTORY — PX: SAVORY DILATION: SHX5439

## 2016-10-29 HISTORY — PX: ESOPHAGOGASTRODUODENOSCOPY (EGD) WITH PROPOFOL: SHX5813

## 2016-10-29 LAB — GLUCOSE, CAPILLARY: Glucose-Capillary: 88 mg/dL (ref 65–99)

## 2016-10-29 SURGERY — ESOPHAGOGASTRODUODENOSCOPY (EGD) WITH PROPOFOL
Anesthesia: Monitor Anesthesia Care

## 2016-10-29 SURGERY — EGD (ESOPHAGOGASTRODUODENOSCOPY)
Anesthesia: Monitor Anesthesia Care

## 2016-10-29 MED ORDER — PROPOFOL 500 MG/50ML IV EMUL
INTRAVENOUS | Status: DC | PRN
  Administered 2016-10-29: 200 ug/kg/min via INTRAVENOUS

## 2016-10-29 MED ORDER — PROPOFOL 10 MG/ML IV BOLUS
INTRAVENOUS | Status: DC | PRN
  Administered 2016-10-29 (×2): 20 mg via INTRAVENOUS

## 2016-10-29 MED ORDER — ONDANSETRON HCL 4 MG/2ML IJ SOLN
INTRAMUSCULAR | Status: AC
  Filled 2016-10-29: qty 2

## 2016-10-29 MED ORDER — PROPOFOL 10 MG/ML IV BOLUS
INTRAVENOUS | Status: AC
  Filled 2016-10-29: qty 20

## 2016-10-29 MED ORDER — LIDOCAINE 2% (20 MG/ML) 5 ML SYRINGE
INTRAMUSCULAR | Status: DC | PRN
  Administered 2016-10-29: 100 mg via INTRAVENOUS

## 2016-10-29 MED ORDER — LACTATED RINGERS IV SOLN
INTRAVENOUS | Status: DC
  Administered 2016-10-29: 08:00:00 via INTRAVENOUS

## 2016-10-29 MED ORDER — ONDANSETRON HCL 4 MG/2ML IJ SOLN
INTRAMUSCULAR | Status: DC | PRN
  Administered 2016-10-29: 4 mg via INTRAVENOUS

## 2016-10-29 MED ORDER — LIDOCAINE 2% (20 MG/ML) 5 ML SYRINGE
INTRAMUSCULAR | Status: AC
  Filled 2016-10-29: qty 5

## 2016-10-29 MED ORDER — SODIUM CHLORIDE 0.9 % IV SOLN: INTRAVENOUS | Status: DC

## 2016-10-29 MED ORDER — PROPOFOL 10 MG/ML IV BOLUS
INTRAVENOUS | Status: AC
  Filled 2016-10-29: qty 40

## 2016-10-29 SURGICAL SUPPLY — 14 items

## 2016-10-29 NOTE — Telephone Encounter (Signed)
Per Dr. Radford Pax, the office needs to receive all cardiac records from [facility] prior to seeing patient.  Informed patient that her appointment tomorrow is cancelled and will be postponed until all records are received. She agrees with treatment plan.

## 2016-10-29 NOTE — Anesthesia Preprocedure Evaluation (Signed)
Anesthesia Evaluation  Patient identified by MRN, date of birth, ID band Patient awake    Reviewed: Allergy & Precautions, NPO status , Patient's Chart, lab work & pertinent test results  History of Anesthesia Complications (+) PONV  Airway Mallampati: II  TM Distance: >3 FB     Dental   Pulmonary shortness of breath, asthma , sleep apnea , COPD, Current Smoker,    breath sounds clear to auscultation       Cardiovascular hypertension, + CAD   Rhythm:Regular Rate:Normal     Neuro/Psych  Headaches,    GI/Hepatic Neg liver ROS, GERD  ,  Endo/Other  diabetes  Renal/GU negative Renal ROS     Musculoskeletal  (+) Arthritis ,   Abdominal   Peds  Hematology   Anesthesia Other Findings   Reproductive/Obstetrics                             Anesthesia Physical Anesthesia Plan  ASA: III  Anesthesia Plan: MAC   Post-op Pain Management:    Induction: Intravenous  Airway Management Planned: Simple Face Mask and Mask  Additional Equipment:   Intra-op Plan:   Post-operative Plan:   Informed Consent: I have reviewed the patients History and Physical, chart, labs and discussed the procedure including the risks, benefits and alternatives for the proposed anesthesia with the patient or authorized representative who has indicated his/her understanding and acceptance.   Dental advisory given  Plan Discussed with: CRNA and Anesthesiologist  Anesthesia Plan Comments:         Anesthesia Quick Evaluation

## 2016-10-29 NOTE — Transfer of Care (Signed)
Immediate Anesthesia Transfer of Care Note  Patient: [name]  Procedure(s) Performed: Procedure(s): ESOPHAGOGASTRODUODENOSCOPY (EGD) WITH PROPOFOL (N/A) SAVORY DILATION (N/A)  Patient Location: PACU  Anesthesia Type:MAC  Level of Consciousness:  sedated, patient cooperative and responds to stimulation  Airway & Oxygen Therapy:Patient Spontanous Breathing and Patient connected to face mask oxgen  Post-op Assessment:  Report given to PACU RN and Post -op Vital signs reviewed and stable  Post vital signs:  Reviewed and stable  Last Vitals:  Vitals:   10/29/16 0815  BP: 119/68  Resp: (!) 22  Temp: 56.1 C    Complications: No apparent anesthesia complications
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
## 2016-10-29 NOTE — Op Note (Signed)
[facility] Patient Name: [name] Procedure Date: 10/29/2016 MRN: [id number] Attending MD: Nancy Fetter Dr., MD Date of Birth: November 01, 1960 CSN: [id number] Age: 56 Admit Type: Outpatient Procedure:                Upper GI endoscopy With Savory Dilatation Indications:              Dysphagia Providers:                Jeneen Rinks L. Kinslie Hove Dr., MD, Cleda Daub, RN, Alfonso Patten, Technician, Virgia Land, CRNA Referring MD:              Medicines:                Monitored Anesthesia Care Complications:            No immediate complications. Estimated Blood Loss:     Estimated blood loss: none. Procedure:                Pre-Anesthesia Assessment:                           - Prior to the procedure, a History and Physical                            was performed, and patient medications and                            allergies were reviewed. The patient's tolerance of                            previous anesthesia was also reviewed. The risks                            and benefits of the procedure and the sedation                            options and risks were discussed with the patient.                            All questions were answered, and informed consent                            was obtained. Prior Anticoagulants: The patient has                            taken no previous anticoagulant or antiplatelet                            agents. ASA Grade Assessment: III - A patient with                            severe systemic disease. After reviewing the risks  and benefits, the patient was deemed in                            satisfactory condition to undergo the procedure.                           After obtaining informed consent, the endoscope was                            passed under direct vision. Throughout the                            procedure, the patient's blood pressure, pulse, and              oxygen saturations were monitored continuously. The                            EG-[id number] ([id number]) scope was introduced through the                            mouth, and advanced to the second part of duodenum.                            The upper GI endoscopy was accomplished without                            difficulty. The patient tolerated the procedure                            well. Scope In: Scope Out: Findings:      There is no endoscopic evidence of Barrett's esophagus, esophagitis,       salmon-colored mucosa or varices in the entire esophagus. Minimal       narrowing at the GE junction      A guidewire was placed under fluoroscopic guidance and the scope was       withdrawn. Dilation was performed in the lower third of the esophagus       with a Savary dilator with no resistance at 14 mm. and 15 mm. No heme on       either the dilators.      Moderate inflammation was found in the gastric antrum.      The examined duodenum was normal. Impression:               - Gastritis.                           - Normal examined duodenum.                           - Dilation performed in the lower third of the                            esophagus. Dilated with 14 and 15 mm Savary dilators                           -  No specimens collected. Moderate Sedation:      MAC by anesthesia Recommendation:           - Patient has a contact number available for                            emergencies. The signs and symptoms of potential                            delayed complications were discussed with the                            patient. Return to normal activities tomorrow.                            Written discharge instructions were provided to the                            patient.                           - Clear liquid diet today.                           - Continue present medications.                           - Return to endoscopist PRN. Procedure Code(s):        ---  Professional ---                           [phone], Esophagogastroduodenoscopy, flexible,                            transoral; with insertion of guide wire followed by                            passage of dilator(s) through esophagus over guide                            wire Diagnosis Code(s):        --- Professional ---                           R13.10, Dysphagia, unspecified                           K29.70, Gastritis, unspecified, without bleeding CPT copyright 2016 American Medical Association. All rights reserved. The codes documented in this report are preliminary and upon coder review may  be revised to meet current compliance requirements. Nancy Fetter Dr., MD 10/29/2016 9:53:23 AM This report has been signed electronically. Number of Addenda: 0

## 2016-10-29 NOTE — Anesthesia Postprocedure Evaluation (Signed)
Anesthesia Post Note  Patient: [name]  Procedure(s) Performed: Procedure(s) (LRB): ESOPHAGOGASTRODUODENOSCOPY (EGD) WITH PROPOFOL (N/A) SAVORY DILATION (N/A)  Patient location during evaluation: PACU Anesthesia Type: MAC Level of consciousness: awake Pain management: pain level controlled Respiratory status: spontaneous breathing Cardiovascular status: stable Anesthetic complications: no       Last Vitals:  Vitals:   10/29/16 1000 10/29/16 1005  BP: 125/67   Pulse: 80 76  Resp: 19 (!) 23  Temp:      Last Pain:  Vitals:   10/29/16 1005  TempSrc:   PainSc: 7                  Mackena Plummer

## 2016-10-29 NOTE — Discharge Instructions (Signed)
YOU HAD AN ENDOSCOPIC PROCEDURE TODAY: Refer to the procedure report and other information in the discharge instructions given to you for any specific questions about what was found during the examination. If this information does not answer your questions, please call [facility] office at [phone] to clarify.   YOU SHOULD EXPECT: Some feelings of bloating in the abdomen. Passage of more gas than usual. Walking can help get rid of the air that was put into your GI tract during the procedure and reduce the bloating. If you had a lower endoscopy (such as a colonoscopy or flexible sigmoidoscopy) you may notice spotting of blood in your stool or on the toilet paper. Some abdominal soreness may be present for a day or two, also.  DIET: Your first meal following the procedure should be a light meal and then it is ok to progress to your normal diet. A half-sandwich or bowl of soup is an example of a good first meal. Heavy or fried foods are harder to digest and may make you feel nauseous or bloated. Drink plenty of fluids but you should avoid alcoholic beverages for 24 hours. If you had a esophageal dilation, please see attached instructions for diet.   Continue current meds. Clear liquids for 4-6 hours, if no chest pain or trouble breathing, soft foods tonight.  Regular diet tomorrow. Call for problems.  ACTIVITY: Your care partner should take you home directly after the procedure. You should plan to take it easy, moving slowly for the rest of the day. You can resume normal activity the day after the procedure however YOU SHOULD NOT DRIVE, use power tools, machinery or perform tasks that involve climbing or major physical exertion for 24 hours (because of the sedation medicines used during the test).   SYMPTOMS TO REPORT IMMEDIATELY: A gastroenterologist can be reached at any hour. Please call [phone]  for any of the following symptoms:  Following lower endoscopy (colonoscopy, flexible  sigmoidoscopy) Excessive amounts of blood in the stool  Significant tenderness, worsening of abdominal pains  Swelling of the abdomen that is new, acute  Fever of 100 or higher  Following upper endoscopy (EGD, EUS, ERCP, esophageal dilation) Vomiting of blood or coffee ground material  New, significant abdominal pain  New, significant chest pain or pain under the shoulder blades  Painful or persistently difficult swallowing  New shortness of breath  Black, tarry-looking or red, bloody stools  FOLLOW UP:  If any biopsies were taken you will be contacted by phone or by letter within the next 1-3 weeks. Call [phone]  if you have not heard about the biopsies in 3 weeks.  Please also call with any specific questions about appointments or follow up tests. Continue current meds. Clear liquids for 4-6 hours, if no chest pain or trouble breathing, soft foods tonight.  Regular diet tomorrow. Call for problems.

## 2016-10-30 ENCOUNTER — Encounter: Payer: Medicare Other | Admitting: Cardiology

## 2016-10-30 NOTE — Telephone Encounter (Signed)
Records received.  Patient information given to scheduling to arrange appointment.

## 2016-10-31 ENCOUNTER — Encounter (HOSPITAL_COMMUNITY): Payer: Self-pay | Admitting: Gastroenterology

## 2016-11-03 ENCOUNTER — Ambulatory Visit: Payer: Medicare Other | Admitting: Skilled Nursing Facility1

## 2016-11-05 ENCOUNTER — Encounter: Payer: Self-pay | Admitting: Skilled Nursing Facility1

## 2016-11-05 ENCOUNTER — Encounter: Payer: Medicare Other | Attending: General Surgery | Admitting: Skilled Nursing Facility1

## 2016-11-05 DIAGNOSIS — Z6841 Body Mass Index (BMI) 40.0 and over, adult: Secondary | ICD-10-CM | POA: Diagnosis not present

## 2016-11-05 DIAGNOSIS — E119 Type 2 diabetes mellitus without complications: Secondary | ICD-10-CM

## 2016-11-05 DIAGNOSIS — Z713 Dietary counseling and surveillance: Secondary | ICD-10-CM | POA: Diagnosis not present

## 2016-11-05 NOTE — Patient Instructions (Addendum)
-  about 15 grams of carbohydrate for a snack: graham cracker with peanut butter OR fruit with peanutbutter OR vegetable with peanutbutter  -you want about 8 grahams of protein with your snack  -Focus on greek yogurt  -Try to eat 5-6 smaller meals throughout the day including all 3 categories: protein, energy, and vegetables   -Work on using your yellow card to make meals  -Work on doing chair exercises

## 2016-11-05 NOTE — Progress Notes (Signed)
Appt start time: 8:30 end time:  9:00  Assessment:   1st SW[facility] Appointment.  Pt states her esophagus has narrowed with stricture and she avoids eating whenever possible due to abdominal pain. Pt states she has severe acid reflux.  Pt states she was in the hospital for 4 days due to the flu. Pt states her gastroenterologist does not want her to eat raw fruit and vegetable because she cannot digest them. Pt states she will not get her teeth realigned until after the surgery. Pt states she has been checking her blood sugar once a day: fasting 116-131, a couple high ones due to the flu. Pt states her colon has had an issue passing movements: 5 bowel movents in one day then 0 for the next few days. Pt states her husband will not buy 2% milk.  Pts A1C 5.9. Sample given: premier protein: lot: [id number] exp: 03/nov/2018  Start Wt at [facility]:  240.7 error in assesment: 140.7 was typed but it was 240.7 pounds Wt: 245 pounds BMI: 44.81  Preferred Learning Style:   No preference indicated   Learning Readiness:   Change in progress  MEDICATIONS: See List   DIETARY INTAKE:  24-hr recall:  B ( AM): very berry cheerios with whole milk Snk ( AM): graham crackers  L ( PM):  Snk ( PM):  D ( PM): grilled cheese and fried potatoes  Snk ( PM):  Beverages: hawaiian punch  Usual physical activity:   Diet to Follow: 1500 calories 170 g carbohydrates 112 g protein 42 g fat   Nutritional Diagnosis:  [place]-3.3 Overweight/obesity related to past poor dietary habits and physical inactivity as evidenced by patient w/ recent Sleeve Gastrectomy surgery following dietary guidelines for continued weight loss.    Intervention:  Nutrition counseling for upcoming Bariatric Surgery. Goals: -about 15 grams of carbohydrate for a snack: graham cracker with peanut butter OR fruit with peanutbutter OR vegetable with peanutbutter -you want about 8 grahams of protein with your snack -Focus on greek yogurt -Try to eat 5-6  smaller meals throughout the day including all 3 categories: protein, energy, and vegetables  -Work on using your yellow card to make meals -Work on doing Systems developer Method Utilized:  Ship broker Hands on  Handouts given during visit include:  Yellow card  Demonstrated degree of understanding via:  Teach Back   Monitoring/Evaluation:  Dietary intake, exercise, and body weight prn.

## 2016-12-01 ENCOUNTER — Ambulatory Visit (INDEPENDENT_AMBULATORY_CARE_PROVIDER_SITE_OTHER): Payer: Medicare Other | Admitting: Cardiology

## 2016-12-01 ENCOUNTER — Encounter: Payer: Self-pay | Admitting: Cardiology

## 2016-12-01 VITALS — BP 124/74 | HR 84 | Ht 62.0 in | Wt 247.4 lb

## 2016-12-01 DIAGNOSIS — I10 Essential (primary) hypertension: Secondary | ICD-10-CM

## 2016-12-01 DIAGNOSIS — I2510 Atherosclerotic heart disease of native coronary artery without angina pectoris: Secondary | ICD-10-CM

## 2016-12-01 DIAGNOSIS — Z01818 Encounter for other preprocedural examination: Secondary | ICD-10-CM | POA: Diagnosis not present

## 2016-12-01 MED ORDER — ASPIRIN EC 81 MG PO TBEC: 81.0000 mg | DELAYED_RELEASE_TABLET | Freq: Every day | ORAL | Status: DC

## 2016-12-01 NOTE — Patient Instructions (Signed)
Medication Instructions:  1) START ASPIRIN 81 mg daily  Labwork: None  Testing/Procedures: Your physician has requested that you have a lexiscan myoview. For further information please visit [email or web address]. Please follow instruction sheet, as given.  Follow-Up: Your physician wants you to follow-up in: 1 year with Dr. Radford Pax. You will receive a reminder letter in the mail two months in advance. If you don't receive a letter, please call our office to schedule the follow-up appointment.   Any Other Special Instructions Will Be Listed Below (If Applicable).     If you need a refill on your cardiac medications before your next appointment, please call your pharmacy.
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
## 2016-12-02 ENCOUNTER — Encounter: Payer: Self-pay | Admitting: Skilled Nursing Facility1

## 2016-12-02 ENCOUNTER — Encounter: Payer: Medicare Other | Attending: General Surgery | Admitting: Skilled Nursing Facility1

## 2016-12-02 DIAGNOSIS — Z6841 Body Mass Index (BMI) 40.0 and over, adult: Secondary | ICD-10-CM | POA: Insufficient documentation

## 2016-12-02 DIAGNOSIS — E119 Type 2 diabetes mellitus without complications: Secondary | ICD-10-CM

## 2016-12-02 DIAGNOSIS — Z713 Dietary counseling and surveillance: Secondary | ICD-10-CM | POA: Insufficient documentation

## 2016-12-02 NOTE — Progress Notes (Signed)
Appt start time: 8:30 end time:  9:00  Assessment:   2nd SWL Appointment.   Pt states her C-PAP is broken and is very tired and struggles with breathing. Pt states she has to push her husband to cook better foods and make better choices for her. Dietitian attempted to educate the pt on making her own choices and taking care of herself but pt was not in a place of listening. Pt states she has has not been checking her blood sugar. Pt had many questions about after surgery and believed it acceptable to consume McD[employer] immediatly after surgery: Dietitian educated the pt on this topic. Pt struggles with comprehension.    Start Wt at NDES:  240.7  Wt: 244.12.8 pounds BMI: 44.77  Preferred Learning Style:   No preference indicated   Learning Readiness:   Change in progress  MEDICATIONS: See List   DIETARY INTAKE:  24-hr recall:  B ( AM): very berry cheerios with whole milk Snk ( AM): graham crackers with peanutbutter L ( PM): skipped-----1/4 big mac Snk ( PM): grapes---mcdonalds dessert  D ( PM): pepperoni pizza thin crust Snk ( PM):  Beverages: hawaiian punch  Usual physical activity:   Diet to Follow: 1500 calories 170 g carbohydrates 112 g protein 42 g fat   Nutritional Diagnosis:  [place]-3.3 Overweight/obesity related to past poor dietary habits and physical inactivity as evidenced by patient w/ recent Sleeve Gastrectomy surgery following dietary guidelines for continued weight loss.    Intervention:  Nutrition counseling for upcoming Bariatric Surgery. Goals: -about 15 grams of carbohydrate for a snack: graham cracker with peanut butter OR fruit with peanutbutter OR vegetable with peanutbutter -you want about 8 grahams of protein with your snack -Focus on greek yogurt -Try to eat 5-6 smaller meals throughout the day including all 3 categories: protein, energy, and vegetables  -Work on using your yellow card to make meals -Work on doing Systems developer  Method Utilized:  Ship broker Hands on  Handouts given during visit include:  More Yellow cards  Demonstrated degree of understanding via:  Teach Back   Monitoring/Evaluation:  Dietary intake, exercise, and body weight prn.

## 2016-12-04 ENCOUNTER — Telehealth (HOSPITAL_COMMUNITY): Payer: Self-pay | Admitting: *Deleted

## 2016-12-04 NOTE — Telephone Encounter (Signed)
Patient given detailed instructions per Myocardial Perfusion Study Information Sheet for the test on 12/08/16. Patient notified to arrive 15 minutes early and that it is imperative to arrive on time for appointment to keep from having the test rescheduled.  If you need to cancel or reschedule your appointment, please call the office within 24 hours of your appointment. Failure to do so may result in a cancellation of your appointment, and a $50 no show fee. Patient verbalized understanding. Britanny Marksberry Jacqueline    

## 2016-12-08 ENCOUNTER — Ambulatory Visit (HOSPITAL_COMMUNITY): Payer: Medicare Other

## 2016-12-09 ENCOUNTER — Ambulatory Visit (HOSPITAL_COMMUNITY): Payer: Medicare Other

## 2016-12-15 ENCOUNTER — Telehealth (HOSPITAL_COMMUNITY): Payer: Self-pay | Admitting: *Deleted

## 2016-12-15 NOTE — Telephone Encounter (Signed)
Patient given detailed instructions per Myocardial Perfusion Study Information Sheet for the test on 12/17/16 at 1230. Patient notified to arrive 15 minutes early and that it is imperative to arrive on time for appointment to keep from having the test rescheduled.  If you need to cancel or reschedule your appointment, please call the office within 24 hours of your appointment. Failure to do so may result in a cancellation of your appointment, and a $50 no show fee. Patient verbalized understanding.[name], [name]

## 2016-12-16 ENCOUNTER — Ambulatory Visit (INDEPENDENT_AMBULATORY_CARE_PROVIDER_SITE_OTHER): Payer: Medicare Other | Admitting: Psychiatry

## 2016-12-16 DIAGNOSIS — F509 Eating disorder, unspecified: Secondary | ICD-10-CM

## 2016-12-17 ENCOUNTER — Ambulatory Visit (HOSPITAL_COMMUNITY): Payer: Medicare Other | Attending: Internal Medicine

## 2016-12-17 DIAGNOSIS — E109 Type 1 diabetes mellitus without complications: Secondary | ICD-10-CM | POA: Insufficient documentation

## 2016-12-17 DIAGNOSIS — J449 Chronic obstructive pulmonary disease, unspecified: Secondary | ICD-10-CM | POA: Diagnosis not present

## 2016-12-17 DIAGNOSIS — I10 Essential (primary) hypertension: Secondary | ICD-10-CM | POA: Diagnosis not present

## 2016-12-17 DIAGNOSIS — Z01818 Encounter for other preprocedural examination: Secondary | ICD-10-CM | POA: Diagnosis not present

## 2016-12-17 DIAGNOSIS — I2510 Atherosclerotic heart disease of native coronary artery without angina pectoris: Secondary | ICD-10-CM | POA: Insufficient documentation

## 2016-12-17 MED ORDER — REGADENOSON 0.4 MG/5ML IV SOLN
0.4000 mg | Freq: Once | INTRAVENOUS | Status: AC
  Administered 2016-12-17: 0.4 mg via INTRAVENOUS

## 2016-12-17 MED ORDER — TECHNETIUM TC 99M TETROFOSMIN IV KIT
31.9000 | PACK | Freq: Once | INTRAVENOUS | Status: AC | PRN
  Administered 2016-12-17: 31.9 via INTRAVENOUS
  Filled 2016-12-17: qty 32

## 2016-12-18 ENCOUNTER — Ambulatory Visit (HOSPITAL_COMMUNITY): Payer: Medicare Other | Attending: Cardiovascular Disease

## 2016-12-18 LAB — MYOCARDIAL PERFUSION IMAGING
CHL CUP NUCLEAR SDS: 0
CHL CUP NUCLEAR SSS: 6
CHL CUP RESTING HR STRESS: 84 {beats}/min
LHR: 0.35
LV sys vol: 41 mL
LVDIAVOL: 88 mL (ref 46–106)
Peak HR: 98 {beats}/min
SRS: 6
TID: 0.82

## 2016-12-18 MED ORDER — TECHNETIUM TC 99M TETROFOSMIN IV KIT
32.7000 | PACK | Freq: Once | INTRAVENOUS | Status: AC | PRN
  Administered 2016-12-18: 32.7 via INTRAVENOUS
  Filled 2016-12-18: qty 33

## 2017-01-01 ENCOUNTER — Encounter: Payer: Medicare Other | Attending: General Surgery | Admitting: Skilled Nursing Facility1

## 2017-01-01 ENCOUNTER — Encounter: Payer: Self-pay | Admitting: Skilled Nursing Facility1

## 2017-01-01 DIAGNOSIS — Z6841 Body Mass Index (BMI) 40.0 and over, adult: Secondary | ICD-10-CM | POA: Insufficient documentation

## 2017-01-01 DIAGNOSIS — Z713 Dietary counseling and surveillance: Secondary | ICD-10-CM | POA: Insufficient documentation

## 2017-01-01 DIAGNOSIS — E119 Type 2 diabetes mellitus without complications: Secondary | ICD-10-CM
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
## 2017-02-04 ENCOUNTER — Encounter: Payer: Self-pay | Admitting: Skilled Nursing Facility1

## 2017-02-04 ENCOUNTER — Encounter: Payer: Medicare Other | Attending: General Surgery | Admitting: Skilled Nursing Facility1

## 2017-02-04 DIAGNOSIS — Z6841 Body Mass Index (BMI) 40.0 and over, adult: Secondary | ICD-10-CM | POA: Diagnosis not present

## 2017-02-04 DIAGNOSIS — Z713 Dietary counseling and surveillance: Secondary | ICD-10-CM | POA: Insufficient documentation

## 2017-02-04 DIAGNOSIS — E119 Type 2 diabetes mellitus without complications: Secondary | ICD-10-CM

## 2017-02-04 NOTE — Progress Notes (Signed)
Appt start time: 8:30 end time:  9:00  Assessment:   4th SWL Appointment.   Pt arrives having lost 7 pounds.  Pt states her puppy keeps her moving. Pt states she has Been working on chewing well and cooking on the grill with a decreased appetite has eaten less snacks.  Pt states she does not check her blood sugar. Pt states she does understand the symptoms of low blood sugar.   Start Wt at NDES:  240.7  Wt: 230.6.4 pounds BMI: 42.14  Preferred Learning Style:   No preference indicated   Learning Readiness:   Change in progress  MEDICATIONS: See List   DIETARY INTAKE:  24-hr recall:  B ( AM): very berry cheerios with whole milk Snk ( AM): graham crackers with peanutbutter L ( PM): skipped-----1/4 big mac Snk ( PM): graham crackers D ( PM): pork chop, green beans, sweet potatoes  Snk ( PM):  Beverages: hawaiian punch  Usual physical activity: playing with the puppy  Diet to Follow: 1500 calories 170 g carbohydrates 112 g protein 42 g fat   Nutritional Diagnosis:  [place]-3.3 Overweight/obesity related to past poor dietary habits and physical inactivity as evidenced by patient w/ recent Sleeve Gastrectomy surgery following dietary guidelines for continued weight loss.    Intervention:  Nutrition counseling for upcoming Bariatric Surgery. Teaching Method Utilized:  Visual Auditory Hands on  Handouts given during visit include:  More Yellow cards  Demonstrated degree of understanding via:  Teach Back   Monitoring/Evaluation:  Dietary intake, exercise, and body weight prn.

## 2017-03-02 ENCOUNTER — Encounter: Payer: Self-pay | Admitting: Skilled Nursing Facility1

## 2017-03-02 ENCOUNTER — Encounter: Payer: Medicare Other | Attending: General Surgery | Admitting: Skilled Nursing Facility1

## 2017-03-02 DIAGNOSIS — J449 Chronic obstructive pulmonary disease, unspecified: Secondary | ICD-10-CM | POA: Diagnosis not present

## 2017-03-02 DIAGNOSIS — Z6841 Body Mass Index (BMI) 40.0 and over, adult: Secondary | ICD-10-CM | POA: Diagnosis not present

## 2017-03-02 DIAGNOSIS — E119 Type 2 diabetes mellitus without complications: Secondary | ICD-10-CM | POA: Insufficient documentation

## 2017-03-02 DIAGNOSIS — Z713 Dietary counseling and surveillance: Secondary | ICD-10-CM | POA: Insufficient documentation

## 2017-03-02 DIAGNOSIS — K219 Gastro-esophageal reflux disease without esophagitis: Secondary | ICD-10-CM | POA: Diagnosis not present

## 2017-03-02 NOTE — Progress Notes (Signed)
Appt start time: 8:30 end time:  9:00  Assessment:   5th SWL Appointment.   Pt arrives with scratches on arms from puppy and storm door. Pt arrives with Crush box to ask if it was ok to drink after surgery. Pt believes she has one more SWL appointment. Pt states her puppy keeps her active, she has been taking the puppy for walks. Pt has been working on chewing but with lack of teeth, it takes a while and does not get to applesauce consistency. Pt reports a perforated colon, reporting every two weeks goes about 3 days without a bowel movement. Pt takes two stool softeners a day. Pt believes her stool gets "stuck in there" and causes her pain. Pt reports occasional loose stools. Pt reports not checking her blood sugars. Pt reports she knows when her blood sugars are low as she "feels shaky on the inside". When this occurs pt looks for something sweet, usually butterscotch candy. Occasionally, she has only one meal and snacks on graham crackers. Pt reports she has not tried any protein shakes yet.    Start Wt at NDES:  240.7  Wt: 229 pounds BMI: 42.14  Preferred Learning Style:   No preference indicated   Learning Readiness:   Change in progress  MEDICATIONS: See List   DIETARY INTAKE:  24-hr recall:  B ( AM):  coco pebbles usually very berry cheerios Snk ( AM):graham cracker no pb L ( PM): salmon patty, mac and cheese, mashed potatoes, baked beans Snk ( PM): chocolate chip poptart  D ( PM): pork chop, green beans, sweet potatoes -- pb&j Snk ( PM):  Beverages: hawaiian punch or crush powder  Usual physical activity: playing with the puppy  Diet to Follow: 1500 calories 170 g carbohydrates 112 g protein 42 g fat   Nutritional Diagnosis:  [place]-3.3 Overweight/obesity related to past poor dietary habits and physical inactivity as evidenced by patient w/ recent Sleeve Gastrectomy surgery following dietary guidelines for continued weight loss.    Intervention:  Nutrition counseling for  upcoming Bariatric Surgery. Goals: - try some protein shakes and find a variety that you enjoy - have consistent meals and snacks throughout the day  Teaching Method Utilized:  Visual Auditory Hands on  Demonstrated degree of understanding via:  Teach Back   Monitoring/Evaluation:  Dietary intake, exercise, and body weight prn.

## 2017-04-02 ENCOUNTER — Encounter: Payer: Self-pay | Admitting: Skilled Nursing Facility1

## 2017-04-02 ENCOUNTER — Encounter: Payer: Medicare Other | Attending: General Surgery | Admitting: Skilled Nursing Facility1

## 2017-04-02 DIAGNOSIS — E119 Type 2 diabetes mellitus without complications: Secondary | ICD-10-CM | POA: Insufficient documentation

## 2017-04-02 DIAGNOSIS — J449 Chronic obstructive pulmonary disease, unspecified: Secondary | ICD-10-CM | POA: Diagnosis not present

## 2017-04-02 DIAGNOSIS — K219 Gastro-esophageal reflux disease without esophagitis: Secondary | ICD-10-CM | POA: Diagnosis not present

## 2017-04-02 DIAGNOSIS — Z6841 Body Mass Index (BMI) 40.0 and over, adult: Secondary | ICD-10-CM | POA: Diagnosis not present

## 2017-04-02 DIAGNOSIS — Z713 Dietary counseling and surveillance: Secondary | ICD-10-CM | POA: Diagnosis not present

## 2017-04-02 NOTE — Progress Notes (Signed)
Appt start time: 8:30 end time:  9:00  Assessment:   6th SWL Appointment. Sleeve Gastrectomy    Pt arrives having lost about 10 pounds. Pt arrives with her support dog which has helped her out of her depression and is a great support for her. Pt states she has been cutting back her foods due to pains from the diverticulitis. Pt has yet to try any protein shakes but she was given coupons to do so. Pt does not check her blood sugar but does report having low blood sugar in the past.   Start Wt at NDES:  240.7  Wt: 219 pounds BMI: 40.13  Preferred Learning Style:   No preference indicated   Learning Readiness:   Change in progress  MEDICATIONS: See List   DIETARY INTAKE:  24-hr recall:  B ( AM):  coco pebbles usually very berry cheerios Snk ( AM):graham cracker no pb L ( PM): salmon patty, mac and cheese, mashed potatoes, baked beans Snk ( PM): chocolate chip poptart  D ( PM): pork chop, green beans, sweet potatoes -- pb&j Snk ( PM):  Beverages: hawaiian punch or crush powder  Usual physical activity: playing with the puppy  Diet to Follow: 1500 calories 170 g carbohydrates 112 g protein 42 g fat   Nutritional Diagnosis:  [place]-3.3 Overweight/obesity related to past poor dietary habits and physical inactivity as evidenced by patient w/ recent Sleeve Gastrectomy surgery following dietary guidelines for continued weight loss.    Intervention:  Nutrition counseling for upcoming Bariatric Surgery. Goals: - try some protein shakes and find a variety that you enjoy - have consistent meals and snacks throughout the day  Teaching Method Utilized:  Visual Auditory Hands on  Demonstrated degree of understanding via:  Teach Back   Monitoring/Evaluation:  Dietary intake, exercise, and body weight prn.

## 2017-04-07 ENCOUNTER — Ambulatory Visit (INDEPENDENT_AMBULATORY_CARE_PROVIDER_SITE_OTHER): Payer: Medicare Other | Admitting: Psychiatry

## 2017-04-07 DIAGNOSIS — F509 Eating disorder, unspecified: Secondary | ICD-10-CM | POA: Diagnosis not present

## 2017-04-20 ENCOUNTER — Encounter: Payer: Medicare Other | Attending: General Surgery | Admitting: Skilled Nursing Facility1

## 2017-04-20 DIAGNOSIS — Z713 Dietary counseling and surveillance: Secondary | ICD-10-CM | POA: Insufficient documentation

## 2017-04-20 DIAGNOSIS — Z6841 Body Mass Index (BMI) 40.0 and over, adult: Secondary | ICD-10-CM | POA: Insufficient documentation

## 2017-04-20 DIAGNOSIS — E119 Type 2 diabetes mellitus without complications: Secondary | ICD-10-CM

## 2017-04-21 ENCOUNTER — Encounter: Payer: Self-pay | Admitting: Skilled Nursing Facility1

## 2017-04-21 NOTE — Progress Notes (Signed)
Pre-Operative Nutrition Class:  Appt start time: 5701   End time:  1830.  Patient was seen on 04/20/2017 for Pre-Operative Bariatric Surgery Education at [facility].   Surgery date:  Surgery type: Sleeve Start weight at [facility]: 240.7 Weight today: 216.9  Samples given per MNT protocol. Patient educated on appropriate usage: Celebrate Vitamins Multivitamin Lot # [id number] Exp: 02/2018  Celebrate Vitamins Calcium  Lot # [id number] Exp:dec-04-2017  PrmierProtein Shake Lot #[id number] Exp: 24/jan/2019  The following the learning objectives were met by the patient during this course:  Identify Pre-Op Dietary Goals and will begin 2 weeks pre-operatively  Identify appropriate sources of fluids and proteins   State protein recommendations and appropriate sources pre and post-operatively  Identify Post-Operative Dietary Goals and will follow for 2 weeks post-operatively  Identify appropriate multivitamin and calcium sources  Describe the need for physical activity post-operatively and will follow MD recommendations  State when to call healthcare provider regarding medication questions or post-operative complications  Handouts given during class include:  Pre-Op Bariatric Surgery Diet Handout  Protein Shake Handout  Post-Op Bariatric Surgery Nutrition Handout  [facility]EL[facility] Program Information Flyer  Support Group Information Flyer  [facility] Bariatric Supplements Price List  Follow-Up Plan: Patient will follow-up at [facility] 2 weeks post operatively for diet advancement per MD.

## 2017-04-23 ENCOUNTER — Ambulatory Visit: Payer: Self-pay | Admitting: General Surgery

## 2017-05-04 ENCOUNTER — Telehealth: Payer: Self-pay | Admitting: Skilled Nursing Facility1

## 2017-05-04 NOTE — Telephone Encounter (Signed)
Pt called clarifying the multivitamin and calcium recommendations needing all of the information specifically as well as the protein requirements.

## 2017-05-08 NOTE — Patient Instructions (Signed)
[name]  05/08/2017   Your procedure is scheduled on: 05/25/17  Report to [facility] Main  Entrance Take [place]  elevators to 3rd floor to  [place] at      [phone].    Call this number if you have problems the morning of surgery [phone]    Remember: ONLY 1 PERSON MAY GO WITH YOU TO SHORT STAY TO GET  READY MORNING OF YOUR SURGERY.  Do not eat food or drink liquids :After Midnight.     Take these medicines the morning of surgery with A SIP OF WATER: protonix, inhalers and bring, amlodipine, xanax DO NOT TAKE ANY DIABETIC MEDICATIONS DAY OF YOUR SURGERY                               You may not have any metal on your body including hair pins and              piercings  Do not wear jewelry, make-up, lotions, powders or perfumes, deodorant             Do not wear nail polish.  Do not shave  48 hours prior to surgery.              Men may shave face and neck.   Do not bring valuables to the hospital. [place].  Contacts, dentures or bridgework may not be worn into surgery.  Leave suitcase in the car. After surgery it may be brought to your room.               Please read over the following fact sheets you were given: _____________________________________________________________________            [facility] - Preparing for Surgery Before surgery, you can play an important role.  Because skin is not sterile, your skin needs to be as free of germs as possible.  You can reduce the number of germs on your skin by washing with CHG (chlorahexidine gluconate) soap before surgery.  CHG is an antiseptic cleaner which kills germs and bonds with the skin to continue killing germs even after washing. Please DO NOT use if you have an allergy to CHG or antibacterial soaps.  If your skin becomes reddened/irritated stop using the CHG and inform your nurse when you arrive at Short Stay. Do not shave (including  legs and underarms) for at least 48 hours prior to the first CHG shower.  You may shave your face/neck. Please follow these instructions carefully:  1.  Shower with CHG Soap the night before surgery and the  morning of Surgery.  2.  If you choose to wash your hair, wash your hair first as usual with your  normal  shampoo.  3.  After you shampoo, rinse your hair and body thoroughly to remove the  shampoo.                           4.  Use CHG as you would any other liquid soap.  You can apply chg directly  to the skin and wash  Gently with a scrungie or clean washcloth.  5.  Apply the CHG Soap to your body ONLY FROM THE NECK DOWN.   Do not use on face/ open                           Wound or open sores. Avoid contact with eyes, ears mouth and genitals (private parts).                       Wash face,  Genitals (private parts) with your normal soap.             6.  Wash thoroughly, paying special attention to the area where your surgery  will be performed.  7.  Thoroughly rinse your body with warm water from the neck down.  8.  DO NOT shower/wash with your normal soap after using and rinsing off  the CHG Soap.                9.  Pat yourself dry with a clean towel.            10.  Wear clean pajamas.            11.  Place clean sheets on your bed the night of your first shower and do not  sleep with pets. Day of Surgery : Do not apply any lotions/deodorants the morning of surgery.  Please wear clean clothes to the hospital/surgery center.  FAILURE TO FOLLOW THESE INSTRUCTIONS MAY RESULT IN THE CANCELLATION OF YOUR SURGERY PATIENT SIGNATURE_________________________________  NURSE SIGNATURE__________________________________  ________________________________________________________________________   Adam Phenix  An incentive spirometer is a tool that can help keep your lungs clear and active. This tool measures how well you are filling your lungs with each breath.  Taking long deep breaths may help reverse or decrease the chance of developing breathing (pulmonary) problems (especially infection) following:  A long period of time when you are unable to move or be active. BEFORE THE PROCEDURE   If the spirometer includes an indicator to show your best effort, your nurse or respiratory therapist will set it to a desired goal.  If possible, sit up straight or lean slightly forward. Try not to slouch.  Hold the incentive spirometer in an upright position. INSTRUCTIONS FOR USE  1. Sit on the edge of your bed if possible, or sit up as far as you can in bed or on a chair. 2. Hold the incentive spirometer in an upright position. 3. Breathe out normally. 4. Place the mouthpiece in your mouth and seal your lips tightly around it. 5. Breathe in slowly and as deeply as possible, raising the piston or the ball toward the top of the column. 6. Hold your breath for 3-5 seconds or for as long as possible. Allow the piston or ball to fall to the bottom of the column. 7. Remove the mouthpiece from your mouth and breathe out normally. 8. Rest for a few seconds and repeat Steps 1 through 7 at least 10 times every 1-2 hours when you are awake. Take your time and take a few normal breaths between deep breaths. 9. The spirometer may include an indicator to show your best effort. Use the indicator as a goal to work toward during each repetition. 10. After each set of 10 deep breaths, practice coughing to be sure your lungs are clear. If you have an incision (the cut made at the time of surgery),  support your incision when coughing by placing a pillow or rolled up towels firmly against it. Once you are able to get out of bed, walk around indoors and cough well. You may stop using the incentive spirometer when instructed by your caregiver.  RISKS AND COMPLICATIONS  Take your time so you do not get dizzy or light-headed.  If you are in pain, you may need to take or ask for pain  medication before doing incentive spirometry. It is harder to take a deep breath if you are having pain. AFTER USE  Rest and breathe slowly and easily.  It can be helpful to keep track of a log of your progress. Your caregiver can provide you with a simple table to help with this. If you are using the spirometer at home, follow these instructions: [place] IF:   You are having difficultly using the spirometer.  You have trouble using the spirometer as often as instructed.  Your pain medication is not giving enough relief while using the spirometer.  You develop fever of 100.5 F (38.1 C) or higher. SEEK IMMEDIATE MEDICAL CARE IF:   You cough up bloody sputum that had not been present before.  You develop fever of 102 F (38.9 C) or greater.  You develop worsening pain at or near the incision site. MAKE SURE YOU:   Understand these instructions.  Will watch your condition.  Will get help right away if you are not doing well or get worse. Document Released: 01/12/2007 Document Revised: 11/24/2011 Document Reviewed: 03/15/2007 [facility] Patient Information 2014 [place], [place].   ________________________________________________________________________

## 2017-05-08 NOTE — Progress Notes (Signed)
Stress test 12/18/16 epic ekg 10/13/16 epic  cxr 09/20/16 epic

## 2017-05-15 ENCOUNTER — Encounter (HOSPITAL_COMMUNITY)
Admission: RE | Admit: 2017-05-15 | Discharge: 2017-05-15 | Disposition: A | Discharge status: Home / Self Care | Payer: Medicare Other | Source: Ambulatory Visit | Attending: General Surgery | Admitting: General Surgery

## 2017-05-15 ENCOUNTER — Encounter (HOSPITAL_COMMUNITY): Payer: Self-pay

## 2017-05-15 DIAGNOSIS — Z01812 Encounter for preprocedural laboratory examination: Secondary | ICD-10-CM | POA: Insufficient documentation

## 2017-05-15 HISTORY — DX: Anxiety disorder, unspecified: F41.9

## 2017-05-15 HISTORY — DX: Unspecified convulsions: R56.9

## 2017-05-15 LAB — COMPREHENSIVE METABOLIC PANEL
ALBUMIN: 3.3 g/dL — AB (ref 3.5–5.0)
ALK PHOS: 79 U/L (ref 38–126)
ALT: 16 U/L (ref 14–54)
AST: 35 U/L (ref 15–41)
Anion gap: 7 (ref 5–15)
BILIRUBIN TOTAL: 0.3 mg/dL (ref 0.3–1.2)
BUN: 10 mg/dL (ref 6–20)
CALCIUM: 8.9 mg/dL (ref 8.9–10.3)
CO2: 33 mmol/L — AB (ref 22–32)
CREATININE: 0.78 mg/dL (ref 0.44–1.00)
Chloride: 99 mmol/L — ABNORMAL LOW (ref 101–111)
GFR calc Af Amer: >60 mL/min (ref >60)
GFR calc non Af Amer: >60 mL/min (ref >60)
GLUCOSE: 120 mg/dL — AB (ref 65–99)
Potassium: 4.3 mmol/L (ref 3.5–5.1)
SODIUM: 139 mmol/L (ref 135–145)
Total Protein: 8.1 g/dL (ref 6.5–8.1)

## 2017-05-15 LAB — CBC WITH DIFFERENTIAL/PLATELET
BASOS PCT: 0 %
Basophils Absolute: 0 10*3/uL (ref 0.0–0.1)
EOS ABS: 0.2 10*3/uL (ref 0.0–0.7)
Eosinophils Relative: 3 %
HEMATOCRIT: 39.3 % (ref 36.0–46.0)
HEMOGLOBIN: 12.4 g/dL (ref 12.0–15.0)
Lymphocytes Relative: 26 %
Lymphs Abs: 2.4 10*3/uL (ref 0.7–4.0)
MCH: 26.3 pg (ref 26.0–34.0)
MCHC: 31.6 g/dL (ref 30.0–36.0)
MCV: 83.3 fL (ref 78.0–100.0)
MONOS PCT: 5 %
Monocytes Absolute: 0.5 10*3/uL (ref 0.1–1.0)
NEUTROS ABS: 6.1 10*3/uL (ref 1.7–7.7)
NEUTROS PCT: 66 %
Platelets: 191 10*3/uL (ref 150–400)
RBC: 4.72 MIL/uL (ref 3.87–5.11)
RDW: 15.9 % — ABNORMAL HIGH (ref 11.5–15.5)
WBC: 9.1 10*3/uL (ref 4.0–10.5)

## 2017-05-15 LAB — HEMOGLOBIN A1C
Hgb A1c MFr Bld: 5.4 % (ref 4.8–5.6)
Mean Plasma Glucose: 108.28 mg/dL

## 2017-05-15 LAB — GLUCOSE, CAPILLARY: GLUCOSE-CAPILLARY: 161 mg/dL — AB (ref 65–99)

## 2017-05-20 LAB — NICOTINE AND METABS, URINE
Cotinine, Urine: 688.6 ng/mL
NICOTINE, URINE: 216.3 ng/mL

## 2017-05-21 NOTE — Progress Notes (Signed)
Nicotine urine done 05/15/17 routed to Dr. Excell Seltzer via epic.

## 2017-06-09 ENCOUNTER — Ambulatory Visit: Payer: Self-pay

## 2017-07-14 ENCOUNTER — Encounter (HOSPITAL_COMMUNITY): Admission: RE | Payer: Self-pay | Source: Ambulatory Visit

## 2017-07-14 ENCOUNTER — Inpatient Hospital Stay (HOSPITAL_COMMUNITY): Admission: RE | Admit: 2017-07-14 | Payer: Medicare Other | Source: Ambulatory Visit | Admitting: General Surgery

## 2017-07-14 SURGERY — GASTRECTOMY, SLEEVE, LAPAROSCOPIC
Anesthesia: General

## 2017-07-28 ENCOUNTER — Ambulatory Visit: Payer: Self-pay

## 2017-08-21 ENCOUNTER — Other Ambulatory Visit: Payer: Self-pay | Admitting: Family Medicine

## 2017-08-21 DIAGNOSIS — N6320 Unspecified lump in the left breast, unspecified quadrant: Secondary | ICD-10-CM

## 2017-08-31 ENCOUNTER — Other Ambulatory Visit: Payer: Self-pay

## 2017-09-17 ENCOUNTER — Other Ambulatory Visit: Payer: Self-pay

## 2017-09-21 ENCOUNTER — Ambulatory Visit
Admission: RE | Admit: 2017-09-21 | Discharge: 2017-09-21 | Disposition: A | Discharge status: Home / Self Care | Payer: Medicare Other | Source: Ambulatory Visit | Attending: Family Medicine | Admitting: Family Medicine

## 2017-09-21 DIAGNOSIS — N6320 Unspecified lump in the left breast, unspecified quadrant: Secondary | ICD-10-CM

## 2017-10-03 DIAGNOSIS — M5416 Radiculopathy, lumbar region: Secondary | ICD-10-CM | POA: Insufficient documentation

## 2017-10-13 ENCOUNTER — Encounter (HOSPITAL_COMMUNITY): Payer: Self-pay | Admitting: Emergency Medicine

## 2017-10-13 ENCOUNTER — Emergency Department (HOSPITAL_COMMUNITY)
Admission: EM | Admit: 2017-10-13 | Discharge: 2017-10-13 | Disposition: A | Discharge status: Home / Self Care | Payer: Medicare Other | Attending: Emergency Medicine | Admitting: Emergency Medicine

## 2017-10-13 ENCOUNTER — Other Ambulatory Visit: Payer: Self-pay

## 2017-10-13 ENCOUNTER — Emergency Department (HOSPITAL_COMMUNITY): Payer: Medicare Other

## 2017-10-13 DIAGNOSIS — R1084 Generalized abdominal pain: Secondary | ICD-10-CM

## 2017-10-13 DIAGNOSIS — I2510 Atherosclerotic heart disease of native coronary artery without angina pectoris: Secondary | ICD-10-CM | POA: Insufficient documentation

## 2017-10-13 DIAGNOSIS — E119 Type 2 diabetes mellitus without complications: Secondary | ICD-10-CM | POA: Insufficient documentation

## 2017-10-13 DIAGNOSIS — Z79899 Other long term (current) drug therapy: Secondary | ICD-10-CM | POA: Insufficient documentation

## 2017-10-13 DIAGNOSIS — I119 Hypertensive heart disease without heart failure: Secondary | ICD-10-CM | POA: Insufficient documentation

## 2017-10-13 DIAGNOSIS — Z7984 Long term (current) use of oral hypoglycemic drugs: Secondary | ICD-10-CM | POA: Diagnosis not present

## 2017-10-13 DIAGNOSIS — J449 Chronic obstructive pulmonary disease, unspecified: Secondary | ICD-10-CM | POA: Insufficient documentation

## 2017-10-13 DIAGNOSIS — Z7982 Long term (current) use of aspirin: Secondary | ICD-10-CM | POA: Insufficient documentation

## 2017-10-13 DIAGNOSIS — F17210 Nicotine dependence, cigarettes, uncomplicated: Secondary | ICD-10-CM | POA: Diagnosis not present

## 2017-10-13 DIAGNOSIS — J45909 Unspecified asthma, uncomplicated: Secondary | ICD-10-CM | POA: Diagnosis not present

## 2017-10-13 DIAGNOSIS — K5900 Constipation, unspecified: Secondary | ICD-10-CM | POA: Insufficient documentation

## 2017-10-13 LAB — COMPREHENSIVE METABOLIC PANEL
ALT: 16 U/L (ref 14–54)
ANION GAP: 11 (ref 5–15)
AST: 29 U/L (ref 15–41)
Albumin: 3.2 g/dL — ABNORMAL LOW (ref 3.5–5.0)
Alkaline Phosphatase: 84 U/L (ref 38–126)
BUN: 5 mg/dL — ABNORMAL LOW (ref 6–20)
CALCIUM: 8.3 mg/dL — AB (ref 8.9–10.3)
CHLORIDE: 98 mmol/L — AB (ref 101–111)
CO2: 28 mmol/L (ref 22–32)
Creatinine, Ser: 0.92 mg/dL (ref 0.44–1.00)
GFR calc non Af Amer: >60 mL/min (ref >60)
Glucose, Bld: 173 mg/dL — ABNORMAL HIGH (ref 65–99)
POTASSIUM: 3.3 mmol/L — AB (ref 3.5–5.1)
Sodium: 137 mmol/L (ref 135–145)
Total Bilirubin: 0.4 mg/dL (ref 0.3–1.2)
Total Protein: 7.3 g/dL (ref 6.5–8.1)

## 2017-10-13 LAB — CBC
HCT: 39.7 % (ref 36.0–46.0)
HEMOGLOBIN: 12 g/dL (ref 12.0–15.0)
MCH: 25.7 pg — AB (ref 26.0–34.0)
MCHC: 30.2 g/dL (ref 30.0–36.0)
MCV: 85 fL (ref 78.0–100.0)
Platelets: 172 10*3/uL (ref 150–400)
RBC: 4.67 MIL/uL (ref 3.87–5.11)
RDW: 15.5 % (ref 11.5–15.5)
WBC: 6.1 10*3/uL (ref 4.0–10.5)

## 2017-10-13 LAB — URINALYSIS, ROUTINE W REFLEX MICROSCOPIC
Bilirubin Urine: NEGATIVE
Glucose, UA: NEGATIVE mg/dL
HGB URINE DIPSTICK: NEGATIVE
Ketones, ur: NEGATIVE mg/dL
Leukocytes, UA: NEGATIVE
Nitrite: NEGATIVE
Protein, ur: NEGATIVE mg/dL
SPECIFIC GRAVITY, URINE: 1.008 (ref 1.005–1.030)
pH: 5 (ref 5.0–8.0)

## 2017-10-13 LAB — I-STAT BETA HCG BLOOD, ED (MC, WL, AP ONLY): I-stat hCG, quantitative: <5 m[IU]/mL (ref <5)

## 2017-10-13 LAB — LIPASE, BLOOD: Lipase: 40 U/L (ref 11–51)

## 2017-10-13 MED ORDER — POTASSIUM CHLORIDE CRYS ER 20 MEQ PO TBCR
40.0000 meq | EXTENDED_RELEASE_TABLET | Freq: Once | ORAL | Status: AC
Start: 2017-10-13 — End: 2017-10-13
  Administered 2017-10-13: 40 meq via ORAL
  Filled 2017-10-13: qty 2

## 2017-10-13 MED ORDER — PSYLLIUM 0.52 G PO CAPS: 0.5200 g | ORAL_CAPSULE | Freq: Every day | ORAL | 0 refills | Status: AC

## 2017-10-13 MED ORDER — PROMETHAZINE HCL 25 MG PO TABS
25.0000 mg | ORAL_TABLET | Freq: Once | ORAL | Status: AC
  Administered 2017-10-13: 25 mg via ORAL
  Filled 2017-10-13: qty 1

## 2017-10-13 MED ORDER — MAGNESIUM CITRATE PO SOLN: 1.0000 | Freq: Once | ORAL | 0 refills | Status: AC

## 2017-10-13 MED ORDER — OXYCODONE HCL 5 MG PO TABS
10.0000 mg | ORAL_TABLET | Freq: Once | ORAL | Status: AC
  Administered 2017-10-13: 10 mg via ORAL
  Filled 2017-10-13: qty 2

## 2017-10-13 NOTE — ED Notes (Signed)
Dr. Betsey Holiday made aware of pt's symptoms. Verbal order for xray.

## 2017-10-13 NOTE — Discharge Instructions (Signed)
Drink 1 bottle of magnesium citrate at home to help with constipation.  Also begin taking fiber supplement.  You may mix this capful with 8 ounces of water.  Drink plenty of fluids and get plenty of rest.  Eat a diet rich in fiber (I have attached information on high-fiber foods).  Follow-up with your gastroenterologist for reevaluation of your symptoms.  They may also want to put you on a medicine to help with your constipation due to chronic pain medicine use.  Return to the emergency department if any concerning signs or symptoms develop such as vomiting, fevers, or worsening abdominal pain.

## 2017-10-13 NOTE — ED Triage Notes (Signed)
Pt c/o abdominal pain. Concerned that she has a bowel blockage, LBM 6 days, states she has been passing gas. Hx SBO.
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
## 2018-05-29 ENCOUNTER — Other Ambulatory Visit: Payer: Self-pay

## 2018-05-29 ENCOUNTER — Emergency Department (HOSPITAL_COMMUNITY)
Admission: EM | Admit: 2018-05-29 | Discharge: 2018-05-29 | Disposition: A | Discharge status: Home / Self Care | Payer: Medicare Other | Attending: Emergency Medicine | Admitting: Emergency Medicine

## 2018-05-29 ENCOUNTER — Emergency Department (HOSPITAL_COMMUNITY): Payer: Medicare Other

## 2018-05-29 DIAGNOSIS — M542 Cervicalgia: Secondary | ICD-10-CM | POA: Insufficient documentation

## 2018-05-29 DIAGNOSIS — M792 Neuralgia and neuritis, unspecified: Secondary | ICD-10-CM

## 2018-05-29 DIAGNOSIS — E119 Type 2 diabetes mellitus without complications: Secondary | ICD-10-CM | POA: Diagnosis not present

## 2018-05-29 DIAGNOSIS — I10 Essential (primary) hypertension: Secondary | ICD-10-CM | POA: Insufficient documentation

## 2018-05-29 DIAGNOSIS — M79601 Pain in right arm: Secondary | ICD-10-CM | POA: Diagnosis present

## 2018-05-29 DIAGNOSIS — Z79899 Other long term (current) drug therapy: Secondary | ICD-10-CM | POA: Diagnosis not present

## 2018-05-29 DIAGNOSIS — Z7982 Long term (current) use of aspirin: Secondary | ICD-10-CM | POA: Diagnosis not present

## 2018-05-29 DIAGNOSIS — E7800 Pure hypercholesterolemia, unspecified: Secondary | ICD-10-CM | POA: Insufficient documentation

## 2018-05-29 DIAGNOSIS — J449 Chronic obstructive pulmonary disease, unspecified: Secondary | ICD-10-CM | POA: Diagnosis not present

## 2018-05-29 DIAGNOSIS — F17210 Nicotine dependence, cigarettes, uncomplicated: Secondary | ICD-10-CM | POA: Insufficient documentation

## 2018-05-29 DIAGNOSIS — Z7984 Long term (current) use of oral hypoglycemic drugs: Secondary | ICD-10-CM | POA: Diagnosis not present

## 2018-05-29 DIAGNOSIS — M5410 Radiculopathy, site unspecified: Secondary | ICD-10-CM | POA: Diagnosis not present

## 2018-05-29 DIAGNOSIS — I2510 Atherosclerotic heart disease of native coronary artery without angina pectoris: Secondary | ICD-10-CM | POA: Insufficient documentation

## 2018-05-29 MED ORDER — KETOROLAC TROMETHAMINE 15 MG/ML IJ SOLN
30.0000 mg | Freq: Once | INTRAMUSCULAR | Status: AC
  Administered 2018-05-29: 30 mg via INTRAMUSCULAR
  Filled 2018-05-29: qty 2

## 2018-05-29 MED ORDER — MORPHINE SULFATE (PF) 4 MG/ML IV SOLN
4.0000 mg | Freq: Once | INTRAVENOUS | Status: AC
  Administered 2018-05-29: 4 mg via INTRAMUSCULAR
  Filled 2018-05-29: qty 1
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
## 2018-05-29 NOTE — ED Triage Notes (Signed)
Patient c/o right arm pain that radiates to right neck and right hand. This began about 2 weeks ago and patient denies injury.

## 2018-05-29 NOTE — Discharge Instructions (Addendum)
Please continue your home medicines and follow up with your doctor

## 2018-07-29 DIAGNOSIS — M47816 Spondylosis without myelopathy or radiculopathy, lumbar region: Secondary | ICD-10-CM | POA: Insufficient documentation

## 2018-08-26 DIAGNOSIS — M5136 Other intervertebral disc degeneration, lumbar region: Secondary | ICD-10-CM | POA: Insufficient documentation

## 2018-10-15 ENCOUNTER — Other Ambulatory Visit: Payer: Self-pay

## 2018-10-15 ENCOUNTER — Emergency Department (HOSPITAL_COMMUNITY): Payer: Medicare Other

## 2018-10-15 ENCOUNTER — Encounter (HOSPITAL_COMMUNITY): Payer: Self-pay | Admitting: Emergency Medicine

## 2018-10-15 ENCOUNTER — Observation Stay (HOSPITAL_COMMUNITY)
Admission: EM | Admit: 2018-10-15 | Discharge: 2018-10-15 | Disposition: A | Discharge status: Home / Self Care | Payer: Medicare Other | Attending: Internal Medicine | Admitting: Internal Medicine

## 2018-10-15 DIAGNOSIS — Z7984 Long term (current) use of oral hypoglycemic drugs: Secondary | ICD-10-CM | POA: Insufficient documentation

## 2018-10-15 DIAGNOSIS — R109 Unspecified abdominal pain: Secondary | ICD-10-CM | POA: Insufficient documentation

## 2018-10-15 DIAGNOSIS — Z8249 Family history of ischemic heart disease and other diseases of the circulatory system: Secondary | ICD-10-CM | POA: Insufficient documentation

## 2018-10-15 DIAGNOSIS — M6281 Muscle weakness (generalized): Secondary | ICD-10-CM | POA: Insufficient documentation

## 2018-10-15 DIAGNOSIS — Z880 Allergy status to penicillin: Secondary | ICD-10-CM | POA: Diagnosis not present

## 2018-10-15 DIAGNOSIS — E7800 Pure hypercholesterolemia, unspecified: Secondary | ICD-10-CM | POA: Diagnosis not present

## 2018-10-15 DIAGNOSIS — E119 Type 2 diabetes mellitus without complications: Secondary | ICD-10-CM | POA: Insufficient documentation

## 2018-10-15 DIAGNOSIS — Z7982 Long term (current) use of aspirin: Secondary | ICD-10-CM | POA: Insufficient documentation

## 2018-10-15 DIAGNOSIS — F17210 Nicotine dependence, cigarettes, uncomplicated: Secondary | ICD-10-CM | POA: Diagnosis not present

## 2018-10-15 DIAGNOSIS — J9611 Chronic respiratory failure with hypoxia: Principal | ICD-10-CM | POA: Insufficient documentation

## 2018-10-15 DIAGNOSIS — R2681 Unsteadiness on feet: Secondary | ICD-10-CM | POA: Insufficient documentation

## 2018-10-15 DIAGNOSIS — Z6841 Body Mass Index (BMI) 40.0 and over, adult: Secondary | ICD-10-CM | POA: Diagnosis not present

## 2018-10-15 DIAGNOSIS — Z79899 Other long term (current) drug therapy: Secondary | ICD-10-CM | POA: Diagnosis not present

## 2018-10-15 DIAGNOSIS — I10 Essential (primary) hypertension: Secondary | ICD-10-CM | POA: Diagnosis not present

## 2018-10-15 DIAGNOSIS — F419 Anxiety disorder, unspecified: Secondary | ICD-10-CM | POA: Diagnosis not present

## 2018-10-15 DIAGNOSIS — Z881 Allergy status to other antibiotic agents status: Secondary | ICD-10-CM | POA: Diagnosis not present

## 2018-10-15 DIAGNOSIS — I2510 Atherosclerotic heart disease of native coronary artery without angina pectoris: Secondary | ICD-10-CM | POA: Diagnosis present

## 2018-10-15 DIAGNOSIS — G894 Chronic pain syndrome: Secondary | ICD-10-CM | POA: Diagnosis not present

## 2018-10-15 DIAGNOSIS — R9431 Abnormal electrocardiogram [ECG] [EKG]: Secondary | ICD-10-CM | POA: Diagnosis not present

## 2018-10-15 DIAGNOSIS — J441 Chronic obstructive pulmonary disease with (acute) exacerbation: Secondary | ICD-10-CM | POA: Diagnosis present

## 2018-10-15 DIAGNOSIS — R0902 Hypoxemia: Secondary | ICD-10-CM | POA: Diagnosis present

## 2018-10-15 DIAGNOSIS — F329 Major depressive disorder, single episode, unspecified: Secondary | ICD-10-CM | POA: Diagnosis not present

## 2018-10-15 DIAGNOSIS — G8929 Other chronic pain: Secondary | ICD-10-CM | POA: Diagnosis present

## 2018-10-15 DIAGNOSIS — K219 Gastro-esophageal reflux disease without esophagitis: Secondary | ICD-10-CM | POA: Insufficient documentation

## 2018-10-15 DIAGNOSIS — K7460 Unspecified cirrhosis of liver: Secondary | ICD-10-CM | POA: Insufficient documentation

## 2018-10-15 DIAGNOSIS — R2689 Other abnormalities of gait and mobility: Secondary | ICD-10-CM | POA: Insufficient documentation

## 2018-10-15 DIAGNOSIS — Z7951 Long term (current) use of inhaled steroids: Secondary | ICD-10-CM | POA: Insufficient documentation

## 2018-10-15 DIAGNOSIS — Z9181 History of falling: Secondary | ICD-10-CM | POA: Insufficient documentation

## 2018-10-15 LAB — BASIC METABOLIC PANEL
ANION GAP: 9 (ref 5–15)
BUN: 6 mg/dL (ref 6–20)
CHLORIDE: 99 mmol/L (ref 98–111)
CO2: 31 mmol/L (ref 22–32)
Calcium: 8.7 mg/dL — ABNORMAL LOW (ref 8.9–10.3)
Creatinine, Ser: 0.79 mg/dL (ref 0.44–1.00)
GFR calc Af Amer: >60 mL/min (ref >60)
GFR calc non Af Amer: >60 mL/min (ref >60)
GLUCOSE: 106 mg/dL — AB (ref 70–99)
POTASSIUM: 3.9 mmol/L (ref 3.5–5.1)
Sodium: 139 mmol/L (ref 135–145)

## 2018-10-15 LAB — CBC
HCT: 35.5 % — ABNORMAL LOW (ref 36.0–46.0)
HEMOGLOBIN: 10 g/dL — AB (ref 12.0–15.0)
MCH: 22.9 pg — ABNORMAL LOW (ref 26.0–34.0)
MCHC: 28.2 g/dL — ABNORMAL LOW (ref 30.0–36.0)
MCV: 81.2 fL (ref 80.0–100.0)
Platelets: 221 10*3/uL (ref 150–400)
RBC: 4.37 MIL/uL (ref 3.87–5.11)
RDW: 16.9 % — ABNORMAL HIGH (ref 11.5–15.5)
WBC: 8.2 10*3/uL (ref 4.0–10.5)
nRBC: 0 % (ref 0.0–0.2)

## 2018-10-15 MED ORDER — ONDANSETRON HCL 4 MG PO TABS
8.0000 mg | ORAL_TABLET | Freq: Three times a day (TID) | ORAL | Status: DC | PRN
Start: 2018-10-15 — End: 2018-10-15

## 2018-10-15 MED ORDER — LEVALBUTEROL HCL 0.63 MG/3ML IN NEBU: 0.6300 mg | INHALATION_SOLUTION | Freq: Once | RESPIRATORY_TRACT | Status: DC

## 2018-10-15 MED ORDER — ACETAMINOPHEN 325 MG PO TABS: 650.0000 mg | ORAL_TABLET | Freq: Four times a day (QID) | ORAL | 0 refills | Status: DC | PRN

## 2018-10-15 MED ORDER — BENAZEPRIL HCL 40 MG PO TABS
40.0000 mg | ORAL_TABLET | Freq: Every day | ORAL | Status: DC
  Administered 2018-10-15: 40 mg via ORAL
  Filled 2018-10-15: qty 1

## 2018-10-15 MED ORDER — ESTRADIOL 2 MG PO TABS
2.0000 mg | ORAL_TABLET | Freq: Every day | ORAL | Status: DC
  Administered 2018-10-15: 2 mg via ORAL
  Filled 2018-10-15: qty 2
  Filled 2018-10-15: qty 1

## 2018-10-15 MED ORDER — SODIUM CHLORIDE 0.9% FLUSH: 3.0000 mL | INTRAVENOUS | Status: DC | PRN

## 2018-10-15 MED ORDER — ACETAMINOPHEN 325 MG PO TABS
650.0000 mg | ORAL_TABLET | Freq: Four times a day (QID) | ORAL | Status: DC | PRN
  Administered 2018-10-15: 650 mg via ORAL
  Filled 2018-10-15: qty 2

## 2018-10-15 MED ORDER — LORATADINE 10 MG PO TABS
10.0000 mg | ORAL_TABLET | Freq: Every day | ORAL | Status: DC
  Administered 2018-10-15: 10 mg via ORAL
  Filled 2018-10-15: qty 1

## 2018-10-15 MED ORDER — SODIUM CHLORIDE 0.9% FLUSH
3.0000 mL | Freq: Two times a day (BID) | INTRAVENOUS | Status: DC
  Administered 2018-10-15: 3 mL via INTRAVENOUS

## 2018-10-15 MED ORDER — ALBUTEROL SULFATE (2.5 MG/3ML) 0.083% IN NEBU
2.5000 mg | INHALATION_SOLUTION | Freq: Once | RESPIRATORY_TRACT | Status: AC
  Administered 2018-10-15: 2.5 mg via RESPIRATORY_TRACT

## 2018-10-15 MED ORDER — ACETAMINOPHEN 500 MG PO TABS
1000.0000 mg | ORAL_TABLET | Freq: Once | ORAL | Status: AC
  Administered 2018-10-15: 1000 mg via ORAL
  Filled 2018-10-15: qty 2

## 2018-10-15 MED ORDER — PANTOPRAZOLE SODIUM 40 MG PO TBEC
40.0000 mg | DELAYED_RELEASE_TABLET | Freq: Two times a day (BID) | ORAL | Status: DC
  Administered 2018-10-15: 40 mg via ORAL
  Filled 2018-10-15: qty 1

## 2018-10-15 MED ORDER — IPRATROPIUM-ALBUTEROL 0.5-2.5 (3) MG/3ML IN SOLN: 3.0000 mL | Freq: Four times a day (QID) | RESPIRATORY_TRACT | 0 refills | Status: DC | PRN

## 2018-10-15 MED ORDER — LACTULOSE 10 GM/15ML PO SOLN
10.0000 g | Freq: Two times a day (BID) | ORAL | Status: DC
  Filled 2018-10-15: qty 15

## 2018-10-15 MED ORDER — OXYCODONE HCL 5 MG PO TABS
10.0000 mg | ORAL_TABLET | Freq: Every day | ORAL | Status: DC
Start: 2018-10-15 — End: 2018-10-15
  Administered 2018-10-15 (×3): 10 mg via ORAL
  Filled 2018-10-15 (×3): qty 2

## 2018-10-15 MED ORDER — ESCITALOPRAM OXALATE 20 MG PO TABS: 20.0000 mg | ORAL_TABLET | Freq: Every day | ORAL | Status: DC

## 2018-10-15 MED ORDER — SODIUM CHLORIDE 0.9 % IV SOLN: 250.0000 mL | INTRAVENOUS | Status: DC | PRN

## 2018-10-15 MED ORDER — ALPRAZOLAM 0.25 MG PO TABS: 1.0000 mg | ORAL_TABLET | ORAL | Status: DC

## 2018-10-15 MED ORDER — IPRATROPIUM-ALBUTEROL 0.5-2.5 (3) MG/3ML IN SOLN
3.0000 mL | Freq: Once | RESPIRATORY_TRACT | Status: DC
  Filled 2018-10-15: qty 3

## 2018-10-15 MED ORDER — ASPIRIN EC 81 MG PO TBEC
81.0000 mg | DELAYED_RELEASE_TABLET | Freq: Every day | ORAL | Status: DC
  Filled 2018-10-15: qty 1

## 2018-10-15 MED ORDER — AMLODIPINE BESYLATE 5 MG PO TABS
5.0000 mg | ORAL_TABLET | Freq: Every day | ORAL | Status: DC
  Administered 2018-10-15: 5 mg via ORAL
  Filled 2018-10-15: qty 1

## 2018-10-15 MED ORDER — ALPRAZOLAM 0.5 MG PO TABS
1.0000 mg | ORAL_TABLET | Freq: Every day | ORAL | Status: DC
  Filled 2018-10-15: qty 2

## 2018-10-15 MED ORDER — PREDNISONE 20 MG PO TABS
40.0000 mg | ORAL_TABLET | Freq: Every day | ORAL | Status: DC
  Administered 2018-10-15: 40 mg via ORAL
  Filled 2018-10-15: qty 2

## 2018-10-15 MED ORDER — OXYCODONE HCL 5 MG PO TABS: 10.0000 mg | ORAL_TABLET | Freq: Once | ORAL | Status: DC

## 2018-10-15 MED ORDER — ALPRAZOLAM 0.5 MG PO TABS: 2.0000 mg | ORAL_TABLET | Freq: Every day | ORAL | Status: DC

## 2018-10-15 MED ORDER — CYCLOBENZAPRINE HCL 10 MG PO TABS: 10.0000 mg | ORAL_TABLET | Freq: Three times a day (TID) | ORAL | Status: DC | PRN

## 2018-10-15 MED ORDER — ALBUTEROL SULFATE (2.5 MG/3ML) 0.083% IN NEBU: 2.5000 mg | INHALATION_SOLUTION | RESPIRATORY_TRACT | Status: DC | PRN

## 2018-10-15 MED ORDER — NITROGLYCERIN 0.4 MG SL SUBL: 0.4000 mg | SUBLINGUAL_TABLET | SUBLINGUAL | Status: DC | PRN

## 2018-10-15 MED ORDER — PREDNISONE 20 MG PO TABS: 40.0000 mg | ORAL_TABLET | Freq: Every day | ORAL | 0 refills | Status: DC

## 2018-10-15 MED ORDER — SIMVASTATIN 20 MG PO TABS: 20.0000 mg | ORAL_TABLET | Freq: Every day | ORAL | Status: DC

## 2018-10-15 NOTE — ED Triage Notes (Addendum)
Pt reports HTN, headache, and low oxygen saturation. Pt reports going to her PCP for same and was told to follow up at the ED if anything changes.   Pt reports she has PRN oxygen but did not use it.  Pt does have drops in her oxygen saturation upon exertion.
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
## 2018-10-15 NOTE — ED Notes (Signed)
Attempted report and RN advises she would call back to get report.

## 2018-10-15 NOTE — Evaluation (Signed)
Physical Therapy Evaluation Patient Details Name: [name] MRN: [id number] DOB: August 18, 1961 Today's Date: 10/15/2018   History of Present Illness  58 y.o. female with medical history significant of COPD, anxiety, chronic pain syndrome, tobacco abuse, morbid obesity, chronic respiratory failure on as needed home oxygen but ran out of her home oxygen because she could not afford it 5 or 6 months ago comes in with over a week of shortness of breath with cough and wheezing at home. Admited for her hypoxia likely secondary to a COPD flare needing oxygen at home.  Clinical Impression  PTA pt living in single story home with 3 steps to enter with her husband. Pt ambulated household distances with a cane and was able to bathe and dress herself. Pt's husband provides assist for iADLs. Pt currently limited in safe mobility by stomach pain, decreased sensation in bilateral feet due to neuropathy and decreased strength and endurance. Pt requires mod I for bed mobility, supervision for transfers and min guard for ambulation with SPC. Pt will not need any additional PT services at d/c, however PT will continue to see her acutely to work on strength and endurance.     Follow Up Recommendations No PT follow up;Supervision for mobility/OOB    Equipment Recommendations  None recommended by PT       Precautions / Restrictions Precautions Precautions: Fall Precaution Comments: hx of falling Restrictions Weight Bearing Restrictions: No      Mobility  Bed Mobility Overal bed mobility: Modified Independent             General bed mobility comments: increased time and effort with HOB elevated  Transfers Overall transfer level: Needs assistance Equipment used: Straight cane Transfers: Sit to/from Stand Sit to Stand: Supervision         General transfer comment: supervision for safety, good use of cane for power up and steadying  Ambulation/Gait Ambulation/Gait assistance: Min guard Gait  Distance (Feet): 30 Feet Assistive device: Straight cane Gait Pattern/deviations: Step-through pattern;Shuffle;Decreased step length - right;Decreased step length - left;Trunk flexed Gait velocity: slowed Gait velocity interpretation: <1.8 ft/sec, indicate of risk for recurrent falls General Gait Details: min guard for safety, slow, shuffling gait with increased lateral sway due to habitus, 2/4 DoE with short distance ambulation         Balance Overall balance assessment: Needs assistance Sitting-balance support: Feet supported;No upper extremity supported Sitting balance-Leahy Scale: Good     Standing balance support: Single extremity supported;During functional activity;No upper extremity supported Standing balance-Leahy Scale: Fair Standing balance comment: able to perform pericare without assist                             Pertinent Vitals/Pain Pain Assessment: 0-10 Pain Score: 10-Worst pain ever Pain Location: stomach Pain Descriptors / Indicators: Cramping;Constant Pain Intervention(s): Limited activity within patient's tolerance;Monitored during session;Repositioned;Patient requesting pain meds-RN notified    Home Living Family/patient expects to be discharged to:: Private residence Living Arrangements: Spouse/significant other Available Help at Discharge: Family;Available 24 hours/day Type of Home: House Home Access: Stairs to enter Entrance Stairs-Rails: Can reach both Entrance Stairs-Number of Steps: 3 Home Layout: One level Home Equipment: Bedside commode;Cane - single point;Walker - 4 wheels      Prior Function Level of Independence: Needs assistance   Gait / Transfers Assistance Needed: ambulates household distances with cane  ADL's / Homemaking Assistance Needed: independent in bathing/dressing, husband assist with iADLs  Extremity/Trunk Assessment   Upper Extremity Assessment Upper Extremity Assessment: Generalized weakness     Lower Extremity Assessment Lower Extremity Assessment: RLE deficits/detail;LLE deficits/detail RLE Deficits / Details: AROM WFL, strength grossly assessed 3+/5 RLE Sensation: history of peripheral neuropathy RLE Coordination: decreased fine motor LLE Deficits / Details: AROM WFL, strength grossly assessed 3+/5 LLE Sensation: history of peripheral neuropathy LLE Coordination: decreased fine motor    Cervical / Trunk Assessment Cervical / Trunk Assessment: Kyphotic  Communication   Communication: No difficulties  Cognition Arousal/Alertness: Awake/alert Behavior During Therapy: WFL for tasks assessed/performed Overall Cognitive Status: Within Functional Limits for tasks assessed                                        General Comments General comments (skin integrity, edema, etc.): Pt on 3L O2 via [place] at entry with SaO2 97%O2 with ambulation on 3L pt SaO2 dropped to 94%O2        Assessment/Plan    PT Assessment Patient needs continued PT services  PT Problem List Decreased activity tolerance;Decreased mobility;Cardiopulmonary status limiting activity;Impaired sensation;Pain       PT Treatment Interventions DME instruction;Gait training;Stair training;Functional mobility training;Therapeutic activities;Therapeutic exercise;Balance training;Patient/family education    PT Goals (Current goals can be found in the Care Plan section)  Acute Rehab PT Goals Patient Stated Goal: get back to her dog PT Goal Formulation: With patient Time For Goal Achievement: 10/29/18 Potential to Achieve Goals: Good    Frequency Min 3X/week   Barriers to discharge        Co-evaluation               AM-PAC PT "6 Clicks" Mobility  Outcome Measure Help needed turning from your back to your side while in a flat bed without using bedrails?: None Help needed moving from lying on your back to sitting on the side of a flat bed without using bedrails?: None Help needed moving  to and from a bed to a chair (including a wheelchair)?: None Help needed standing up from a chair using your arms (e.g., wheelchair or bedside chair)?: A Little Help needed to walk in hospital room?: A Little Help needed climbing 3-5 steps with a railing? : A Little 6 Click Score: 21    End of Session Equipment Utilized During Treatment: Gait belt Activity Tolerance: Patient limited by pain Patient left: in chair;with call bell/phone within reach;Other (comment)(Nutritionist in room) Nurse Communication: Patient requests pain meds;Mobility status PT Visit Diagnosis: History of falling (Z91.81);Unsteadiness on feet (R26.81);Other abnormalities of gait and mobility (R26.89);Pain;Other symptoms and signs involving the nervous system (R29.898) Pain - part of body: (stomach)    Time: 1610-9604 PT Time Calculation (min) (ACUTE ONLY): 18 min   Charges:   PT Evaluation $PT Eval Moderate Complexity: 1 Mod          Kyzer Blowe B. Migdalia Dk PT, DPT Acute Rehabilitation Services Pager [phone] Office [phone]   [place] 10/15/2018, 10:10 AM

## 2018-10-15 NOTE — Discharge Summary (Signed)
Physician Discharge Summary  [name] CHOJNOWSKI XYI:[id number] DOB: 1961/03/27 DOA: 10/15/2018  PCP: Shirline Frees, MD  Admit date: 10/15/2018 Discharge date: 10/15/2018  Admitted From: Home Disposition:  Home  Discharge Condition:Stable CODE STATUS:FULL Diet recommendation: Heart Healthy   Brief/Interim Summary: HPI: [name] is a 58 y.o. female with medical history significant of COPD, anxiety, chronic pain syndrome, tobacco abuse, morbid obesity, chronic respiratory failure on as needed home oxygen but ran out of her home oxygen because she could not afford it 5 or 6 months ago comes in with over a week of shortness of breath with cough and wheezing at home.  Patient denies any fevers.  She denies any nausea vomiting or diarrhea.  She denies any swelling or pain in her legs.  Patient hypoxic with O2 sats 82% on room air.  Patient referred for admission for her hypoxia likely secondary to a COPD flare needing oxygen at home.   Hospital Course: Patient's hospital course remained stable.  She qualified for home oxygen.  We arranged home oxygen at 3 L/min.  She will be also arranged home nebulization machine. She is hemodynamically stable for discharge today.  PT evaluated her and did not recommend anything specific.  She will continue oral prednisone for few more days at home.  We recommend her to follow-up with her PCP and pulmonology as an outpatient. Has been smoking 1-1 and half packs a day.  Counseled her that is extremely important to stop smoking especially when she is on oxygen and also to prevent progression of her COPD. She will continue her other home medications.    Discharge Diagnoses:  Principal Problem:   COPD exacerbation ([place]) Active Problems:   Chronic pain   CAD (coronary artery disease)   Hypoxia    Discharge Instructions  Discharge Instructions    Diet - low sodium heart healthy   Complete by:  As directed    Discharge instructions   Complete by:  As  directed    1)Please stop smoking. 2)Take prescribed medications as instructed. 3)Follow up with pulmonology as an outpatient.   Increase activity slowly   Complete by:  As directed      Allergies as of 10/15/2018      Reactions   Amoxicillin Nausea And Vomiting   Clindamycin/lincomycin Nausea And Vomiting   Doxycycline Nausea And Vomiting   Treximet [sumatriptan-naproxen Sodium] Nausea And Vomiting   Bactrim Hives, Rash      Medication List    TAKE these medications   acetaminophen 325 MG tablet Commonly known as:  TYLENOL Take 2 tablets (650 mg total) by mouth every 6 (six) hours as needed for headache.   albuterol 108 (90 Base) MCG/ACT inhaler Commonly known as:  PROVENTIL HFA;VENTOLIN HFA Inhale 2 puffs into the lungs every 4 (four) hours as needed for wheezing or shortness of breath.   ALPRAZolam 1 MG tablet Commonly known as:  XANAX Take 1-2 mg by mouth See admin instructions. Take 1 mg in the morning and 2 mg at bedtime.   amLODipine 5 MG tablet Commonly known as:  NORVASC Take 5 mg by mouth daily.   aspirin EC 81 MG tablet Take 1 tablet (81 mg total) by mouth daily.   benazepril 40 MG tablet Commonly known as:  LOTENSIN Take 40 mg by mouth daily.   budesonide-formoterol 160-4.5 MCG/ACT inhaler Commonly known as:  SYMBICORT Inhale 2 puffs into the lungs 2 (two) times daily.   cyclobenzaprine 10 MG tablet Commonly known as:  FLEXERIL Take 10 mg by mouth 3 (three) times daily as needed for muscle spasms.   escitalopram 20 MG tablet Commonly known as:  LEXAPRO Take 20 mg by mouth at bedtime.   estradiol 2 MG tablet Commonly known as:  ESTRACE Take 2 mg by mouth daily.   ipratropium-albuterol 0.5-2.5 (3) MG/3ML Soln Commonly known as:  DUONEB Take 3 mLs by nebulization every 6 (six) hours as needed (Shortness of breath).   lactulose 10 GM/15ML solution Commonly known as:  CHRONULAC Take 10 g by mouth 2 (two) times daily.   levocetirizine 5 MG  tablet Commonly known as:  XYZAL Take 5 mg by mouth daily.   metFORMIN 500 MG tablet Commonly known as:  GLUCOPHAGE Take 2 tablets (1,000 mg total) by mouth 2 (two) times daily with a meal. What changed:  how much to take   nitroGLYCERIN 0.4 MG SL tablet Commonly known as:  NITROSTAT Place 0.4 mg under the tongue every 5 (five) minutes as needed for chest pain.   ondansetron 8 MG tablet Commonly known as:  ZOFRAN Take 8 mg by mouth every 8 (eight) hours as needed for nausea or vomiting.   Oxycodone HCl 10 MG Tabs Take 10 mg by mouth 5 (five) times daily.   pantoprazole 40 MG tablet Commonly known as:  PROTONIX Take 40 mg by mouth 2 (two) times daily.   predniSONE 20 MG tablet Commonly known as:  DELTASONE Take 2 tablets (40 mg total) by mouth daily with breakfast for 4 days. Start taking on:  October 16, 2018   simvastatin 20 MG tablet Commonly known as:  ZOCOR Take 20 mg by mouth at bedtime.            Durable Medical Equipment  (From admission, onward)         Start     Ordered   10/15/18 1132  For home use only DME Nebulizer machine  Once    Question:  Patient needs a nebulizer to treat with the following condition  Answer:  COPD (chronic obstructive pulmonary disease) ([place])   10/15/18 1131   10/15/18 1128  For home use only DME oxygen  Once    Question Answer Comment  Mode or (Route) Nasal cannula   Liters per Minute 3   Frequency Continuous (stationary and portable oxygen unit needed)   Oxygen delivery system Gas      10/15/18 1128         Follow-up Information    Shirline Frees, MD. Schedule an appointment as soon as possible for a visit in 1 week(s).   Specialty:  Family Medicine Contact information: [place] [address] [phone]          Allergies  Allergen Reactions  . Amoxicillin Nausea And Vomiting  . Clindamycin/Lincomycin Nausea And Vomiting  . Doxycycline Nausea And Vomiting  . Treximet  [Sumatriptan-Naproxen Sodium] Nausea And Vomiting  . Bactrim Hives and Rash    Consultations:  None   Procedures/Studies: Dg Chest 2 View  Result Date: 10/15/2018 CLINICAL DATA:  Hypertension, headache, and low oxygen saturation tonight. EXAM: CHEST - 2 VIEW COMPARISON:  10/13/2017 FINDINGS: Normal heart size and pulmonary vascularity. No focal airspace disease or consolidation in the lungs. No blunting of costophrenic angles. No pneumothorax. Mediastinal contours appear intact. Degenerative changes in the spine. IMPRESSION: No active cardiopulmonary disease. Electronically Signed   By: Lucienne Capers M.D.   On: 10/15/2018 03:52       Subjective:  Patient seen and examined the bedside this morning.  Remains hemodynamically stable.  Comfortable.  Denies any shortness of breath on rest.  Qualified for home oxygen. Discharge Exam: Vitals:   10/15/18 0530 10/15/18 0600  BP: 133/64 136/65  Pulse: 88 86  Resp: 18 16  Temp:  98 F (36.7 C)  SpO2: 94% (!) 86%   Vitals:   10/15/18 0500 10/15/18 0515 10/15/18 0530 10/15/18 0600  BP: (!) 124/59 126/64 133/64 136/65  Pulse: 89 91 88 86  Resp: 17 20 18 16   Temp:    98 F (36.7 C)  TempSrc:    Oral  SpO2: 92% 93% 94% (!) 86%  Weight:      Height:        General: Pt is alert, awake, not in acute distress,obese Cardiovascular: RRR, S1/S2 +, no rubs, no gallops Respiratory: CTA bilaterally, no wheezing, no rhonchi Abdominal: Soft, NT, ND, bowel sounds + Extremities: no edema, no cyanosis    The results of significant diagnostics from this hospitalization (including imaging, microbiology, ancillary and laboratory) are listed below for reference.     Microbiology: No results found for this or any previous visit (from the past 240 hour(s)).   Labs: BNP (last 3 results) No results for input(s): BNP in the last 8760 hours. Basic Metabolic Panel: Recent Labs  Lab 10/15/18 0300  NA 139  K 3.9  CL 99  CO2 31  GLUCOSE 106*   BUN 6  CREATININE 0.79  CALCIUM 8.7*   Liver Function Tests: No results for input(s): AST, ALT, ALKPHOS, BILITOT, PROT, ALBUMIN in the last 168 hours. No results for input(s): LIPASE, AMYLASE in the last 168 hours. No results for input(s): AMMONIA in the last 168 hours. CBC: Recent Labs  Lab 10/15/18 0300  WBC 8.2  HGB 10.0*  HCT 35.5*  MCV 81.2  PLT 221   Cardiac Enzymes: No results for input(s): CKTOTAL, CKMB, CKMBINDEX, TROPONINI in the last 168 hours. BNP: Invalid input(s): POCBNP CBG: No results for input(s): GLUCAP in the last 168 hours. D-Dimer No results for input(s): DDIMER in the last 72 hours. Hgb A1c No results for input(s): HGBA1C in the last 72 hours. Lipid Profile No results for input(s): CHOL, HDL, LDLCALC, TRIG, CHOLHDL, LDLDIRECT in the last 72 hours. Thyroid function studies No results for input(s): TSH, T4TOTAL, T3FREE, THYROIDAB in the last 72 hours.  Invalid input(s): FREET3 Anemia work up No results for input(s): VITAMINB12, FOLATE, FERRITIN, TIBC, IRON, RETICCTPCT in the last 72 hours. Urinalysis    Component Value Date/Time   COLORURINE YELLOW 10/13/2017 0201   APPEARANCEUR CLEAR 10/13/2017 0201   LABSPEC 1.008 10/13/2017 0201   PHURINE 5.0 10/13/2017 0201   GLUCOSEU NEGATIVE 10/13/2017 0201   HGBUR NEGATIVE 10/13/2017 0201   BILIRUBINUR NEGATIVE 10/13/2017 0201   KETONESUR NEGATIVE 10/13/2017 0201   PROTEINUR NEGATIVE 10/13/2017 0201   UROBILINOGEN 0.2 12/09/2014 0840   NITRITE NEGATIVE 10/13/2017 0201   LEUKOCYTESUR NEGATIVE 10/13/2017 0201   Sepsis Labs Invalid input(s): PROCALCITONIN,  WBC,  LACTICIDVEN Microbiology No results found for this or any previous visit (from the past 240 hour(s)).  Please note: You were cared for by a hospitalist during your hospital stay. Once you are discharged, your primary care physician will handle any further medical issues. Please note that NO REFILLS for any discharge medications will be  authorized once you are discharged, as it is imperative that you return to your primary care physician (or establish a relationship with a primary care  physician if you do not have one) for your post hospital discharge needs so that they can reassess your need for medications and monitor your lab values.    Time coordinating discharge: 40 minutes  SIGNED:   Shelly Coss, MD  [facility] 10/15/2018, 11:35 AM Pager [phone]  If 7PM-7AM, please contact night-coverage [email or web address] Password [email or web address]
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
## 2018-10-15 NOTE — Progress Notes (Signed)
SATURATION QUALIFICATIONS: (This note is used to comply with regulatory documentation for home oxygen)  Patient Saturations on Room Air at Rest = 95%  Patient Saturations on Room Air while Ambulating = 82%  Patient Saturations on 3 Liters of oxygen while Ambulating = 92%  Please briefly explain why patient needs home oxygen:

## 2018-10-15 NOTE — Progress Notes (Signed)
Patient discharged to home. Verbalizes understanding of all discharge instructions including discharge medications and follow up MD visits. Patient educated on the importance of quitting smoking. Patient provided home O2. Patient transferred by spouse.

## 2018-10-15 NOTE — Care Management Obs Status (Signed)
[facility] NOTIFICATION   Patient Details  Name: [name] MRN: [id number] Date of Birth: 17-Nov-1960   Medicare Observation Status Notification Given:  Yes    Marilu Favre, RN 10/15/2018, 10:03 AM

## 2018-10-15 NOTE — ED Notes (Signed)
Pt's oxygen saturations were fluctuating from 81%-94% on room, placed pt on 2 liters by cannula.

## 2018-10-15 NOTE — Evaluation (Signed)
Occupational Therapy Evaluation Patient Details Name: [name] MRN: [id number] DOB: [birth year] Today's Date: 10/15/2018    History of Present Illness 58 y.o. female with medical history significant of COPD, anxiety, chronic pain syndrome, tobacco abuse, morbid obesity, chronic respiratory failure on as needed home oxygen but ran out of her home oxygen because she could not afford it 5 or 6 months ago comes in with over a week of shortness of breath with cough and wheezing at home. Admited for her hypoxia likely secondary to a COPD flare needing oxygen at home.   Clinical Impression   Pt is at baseline Mod I level of function with ADLs with increased time to complete due to pt does fatigue easily. Pt uses cane for ambulation with sup. Pt aware of energy conservation techniques. All education completed and no further acute OT is indicated at this time    Follow Up Recommendations  No OT follow up;Supervision - Intermittent    Equipment Recommendations  None recommended by OT    Recommendations for Other Services       Precautions / Restrictions Precautions Precautions: Fall Precaution Comments: hx of falling Restrictions Weight Bearing Restrictions: No      Mobility Bed Mobility Overal bed mobility: Modified Independent             General bed mobility comments: increased time and effort with HOB elevated  Transfers Overall transfer level: Needs assistance Equipment used: Straight cane Transfers: Sit to/from Stand Sit to Stand: Supervision         General transfer comment: supervision for safety, good use of cane for power up and steadying    Balance Overall balance assessment: Needs assistance Sitting-balance support: Feet supported;No upper extremity supported Sitting balance-Leahy Scale: Good     Standing balance support: Single extremity supported;During functional activity;No upper extremity supported Standing balance-Leahy Scale: Fair                             ADL either performed or assessed with clinical judgement   ADL Overall ADL's : At baseline;Modified independent                                       General ADL Comments: increased time to complete due to fatiguing easily     Vision Baseline Vision/History: No visual deficits Patient Visual Report: No change from baseline       Perception     Praxis      Pertinent Vitals/Pain Pain Assessment: No/denies pain Pain Score: 0-No pain Pain Intervention(s): Monitored during session     Hand Dominance Right   Extremity/Trunk Assessment Upper Extremity Assessment Upper Extremity Assessment: Generalized weakness   Lower Extremity Assessment Lower Extremity Assessment: Defer to PT evaluation   Cervical / Trunk Assessment Cervical / Trunk Assessment: Kyphotic   Communication Communication Communication: No difficulties   Cognition Arousal/Alertness: Awake/alert Behavior During Therapy: WFL for tasks assessed/performed Overall Cognitive Status: Within Functional Limits for tasks assessed                                     General Comments       Exercises     Shoulder Instructions      Home Living Family/patient expects to be discharged to:: Private residence Living Arrangements:  Spouse/significant other Available Help at Discharge: Family;Available 24 hours/day Type of Home: House Home Access: Stairs to enter [employer] of Steps: 3 Entrance Stairs-Rails: Can reach both Home Layout: One level     Bathroom Shower/Tub: Tub/shower unit;Curtain   Biochemist, clinical: Standard Bathroom Accessibility: Yes   Home Equipment: Bedside commode;Cane - single point;Walker - 4 wheels          Prior Functioning/Environment Level of Independence: Needs assistance  Gait / Transfers Assistance Needed: ambulates household distances with cane ADL's / Homemaking Assistance Needed: independent in  bathing/dressing, husband assist with iADLs            OT Problem List: Decreased activity tolerance;Cardiopulmonary status limiting activity      OT Treatment/Interventions:      OT Goals(Current goals can be found in the care plan section) Acute Rehab OT Goals Patient Stated Goal: get back to her dog OT Goal Formulation: With patient  OT Frequency:     Barriers to D/C:    no barriers       Co-evaluation              AM-PAC OT "6 Clicks" Daily Activity     Outcome Measure Help from another person eating meals?: None Help from another person taking care of personal grooming?: None Help from another person toileting, which includes using toliet, bedpan, or urinal?: None Help from another person bathing (including washing, rinsing, drying)?: None Help from another person to put on and taking off regular upper body clothing?: None Help from another person to put on and taking off regular lower body clothing?: None 6 Click Score: 24   End of Session Equipment Utilized During Treatment: Gait belt;Other (comment)(cane)  Activity Tolerance: Patient limited by fatigue Patient left: in bed;with call bell/phone within reach  OT Visit Diagnosis: History of falling (Z91.81);Muscle weakness (generalized) (M62.81)                Time: 8469-6295 OT Time Calculation (min): 26 min Charges:  OT General Charges $OT Visit: 1 Visit OT Evaluation $OT Eval Moderate Complexity: 1 Mod OT Treatments $Self Care/Home Management : 8-22 mins    Britt Bottom 10/15/2018, 1:45 PM

## 2018-10-15 NOTE — ED Notes (Signed)
DuoNeb started and completed prior to noting the discontinuation of medication.

## 2018-10-15 NOTE — Care Management Note (Addendum)
Case Management Note  Patient Details  Name: DELANE STALLING MRN: [id number] Date of Birth: Jan 11, 1961  Subjective/Objective:                    Action/Plan:  Spoke to patient at bedside with MD present.   Patient currently does NOT have oxygen at home. She did in the past but she states her co pay is $27 a month. When her breathing became better she returned the oxygen , because she did not want to pay $27 a month if she was not using the oxygen.    Will need oxygen saturation note , nurse aware. Also will need order for home oxygen.   Jermain with [facility] aware and awaiting note and order   Received note and order. Patient already has NEB machine at home. Oxygen ordered through [facility] with [facility], he will bring portable tank to patient's room.. Expected Discharge Date:                  Expected Discharge Plan:  Home/Self Care  In-House Referral:  NA  Discharge planning Services  CM Consult  Post Acute Care Choice:  Durable Medical Equipment Choice offered to:  Patient  DME Arranged:  Oxygen DME Agency:  [facility] Arranged:  NA [place] Agency:  NA  Status of Service:  In process, will continue to follow  If discussed at Long Length of Stay Meetings, dates discussed:    Additional Comments:  Marilu Favre, RN 10/15/2018, 10:05 AM

## 2018-10-16 DIAGNOSIS — J101 Influenza due to other identified influenza virus with other respiratory manifestations: Secondary | ICD-10-CM

## 2018-10-16 HISTORY — DX: Influenza due to other identified influenza virus with other respiratory manifestations: J10.1

## 2018-10-18 ENCOUNTER — Encounter (HOSPITAL_COMMUNITY): Payer: Self-pay | Admitting: Emergency Medicine

## 2018-10-18 ENCOUNTER — Other Ambulatory Visit: Payer: Self-pay

## 2018-10-18 ENCOUNTER — Inpatient Hospital Stay (HOSPITAL_COMMUNITY)
Admission: EM | Admit: 2018-10-18 | Discharge: 2018-10-22 | DRG: 193 | Disposition: A | Discharge status: Home Health | Payer: Medicare Other | Attending: Internal Medicine | Admitting: Internal Medicine

## 2018-10-18 ENCOUNTER — Emergency Department (HOSPITAL_COMMUNITY): Payer: Medicare Other

## 2018-10-18 DIAGNOSIS — Z881 Allergy status to other antibiotic agents status: Secondary | ICD-10-CM

## 2018-10-18 DIAGNOSIS — J101 Influenza due to other identified influenza virus with other respiratory manifestations: Secondary | ICD-10-CM

## 2018-10-18 DIAGNOSIS — K7460 Unspecified cirrhosis of liver: Secondary | ICD-10-CM | POA: Diagnosis present

## 2018-10-18 DIAGNOSIS — Z886 Allergy status to analgesic agent status: Secondary | ICD-10-CM

## 2018-10-18 DIAGNOSIS — J189 Pneumonia, unspecified organism: Secondary | ICD-10-CM | POA: Diagnosis not present

## 2018-10-18 DIAGNOSIS — E1159 Type 2 diabetes mellitus with other circulatory complications: Secondary | ICD-10-CM | POA: Diagnosis present

## 2018-10-18 DIAGNOSIS — Z8249 Family history of ischemic heart disease and other diseases of the circulatory system: Secondary | ICD-10-CM

## 2018-10-18 DIAGNOSIS — Z803 Family history of malignant neoplasm of breast: Secondary | ICD-10-CM

## 2018-10-18 DIAGNOSIS — R0602 Shortness of breath: Secondary | ICD-10-CM | POA: Diagnosis not present

## 2018-10-18 DIAGNOSIS — G43909 Migraine, unspecified, not intractable, without status migrainosus: Secondary | ICD-10-CM | POA: Diagnosis present

## 2018-10-18 DIAGNOSIS — E785 Hyperlipidemia, unspecified: Secondary | ICD-10-CM | POA: Diagnosis present

## 2018-10-18 DIAGNOSIS — K5900 Constipation, unspecified: Secondary | ICD-10-CM | POA: Diagnosis not present

## 2018-10-18 DIAGNOSIS — Y92230 Patient room in hospital as the place of occurrence of the external cause: Secondary | ICD-10-CM | POA: Diagnosis not present

## 2018-10-18 DIAGNOSIS — J440 Chronic obstructive pulmonary disease with acute lower respiratory infection: Secondary | ICD-10-CM | POA: Diagnosis present

## 2018-10-18 DIAGNOSIS — J1008 Influenza due to other identified influenza virus with other specified pneumonia: Secondary | ICD-10-CM | POA: Diagnosis not present

## 2018-10-18 DIAGNOSIS — Z79891 Long term (current) use of opiate analgesic: Secondary | ICD-10-CM

## 2018-10-18 DIAGNOSIS — J441 Chronic obstructive pulmonary disease with (acute) exacerbation: Secondary | ICD-10-CM | POA: Diagnosis not present

## 2018-10-18 DIAGNOSIS — Z880 Allergy status to penicillin: Secondary | ICD-10-CM

## 2018-10-18 DIAGNOSIS — R059 Cough, unspecified: Secondary | ICD-10-CM

## 2018-10-18 DIAGNOSIS — T380X5A Adverse effect of glucocorticoids and synthetic analogues, initial encounter: Secondary | ICD-10-CM | POA: Diagnosis not present

## 2018-10-18 DIAGNOSIS — F17210 Nicotine dependence, cigarettes, uncomplicated: Secondary | ICD-10-CM | POA: Diagnosis present

## 2018-10-18 DIAGNOSIS — J9621 Acute and chronic respiratory failure with hypoxia: Secondary | ICD-10-CM | POA: Diagnosis not present

## 2018-10-18 DIAGNOSIS — Z7984 Long term (current) use of oral hypoglycemic drugs: Secondary | ICD-10-CM

## 2018-10-18 DIAGNOSIS — Z6841 Body Mass Index (BMI) 40.0 and over, adult: Secondary | ICD-10-CM

## 2018-10-18 DIAGNOSIS — Z7951 Long term (current) use of inhaled steroids: Secondary | ICD-10-CM

## 2018-10-18 DIAGNOSIS — Z801 Family history of malignant neoplasm of trachea, bronchus and lung: Secondary | ICD-10-CM

## 2018-10-18 DIAGNOSIS — Z79899 Other long term (current) drug therapy: Secondary | ICD-10-CM

## 2018-10-18 DIAGNOSIS — R1310 Dysphagia, unspecified: Secondary | ICD-10-CM | POA: Diagnosis present

## 2018-10-18 DIAGNOSIS — I10 Essential (primary) hypertension: Secondary | ICD-10-CM | POA: Diagnosis present

## 2018-10-18 DIAGNOSIS — J181 Lobar pneumonia, unspecified organism: Secondary | ICD-10-CM

## 2018-10-18 DIAGNOSIS — I4581 Long QT syndrome: Secondary | ICD-10-CM | POA: Diagnosis present

## 2018-10-18 DIAGNOSIS — Z90710 Acquired absence of both cervix and uterus: Secondary | ICD-10-CM

## 2018-10-18 DIAGNOSIS — Z79890 Hormone replacement therapy (postmenopausal): Secondary | ICD-10-CM

## 2018-10-18 DIAGNOSIS — D649 Anemia, unspecified: Secondary | ICD-10-CM | POA: Diagnosis present

## 2018-10-18 DIAGNOSIS — R05 Cough: Secondary | ICD-10-CM

## 2018-10-18 DIAGNOSIS — I152 Hypertension secondary to endocrine disorders: Secondary | ICD-10-CM | POA: Diagnosis present

## 2018-10-18 DIAGNOSIS — G894 Chronic pain syndrome: Secondary | ICD-10-CM | POA: Diagnosis present

## 2018-10-18 DIAGNOSIS — Z811 Family history of alcohol abuse and dependence: Secondary | ICD-10-CM

## 2018-10-18 DIAGNOSIS — Z823 Family history of stroke: Secondary | ICD-10-CM

## 2018-10-18 DIAGNOSIS — R109 Unspecified abdominal pain: Secondary | ICD-10-CM

## 2018-10-18 DIAGNOSIS — E1165 Type 2 diabetes mellitus with hyperglycemia: Secondary | ICD-10-CM | POA: Diagnosis not present

## 2018-10-18 DIAGNOSIS — G4733 Obstructive sleep apnea (adult) (pediatric): Secondary | ICD-10-CM | POA: Diagnosis present

## 2018-10-18 DIAGNOSIS — Y95 Nosocomial condition: Secondary | ICD-10-CM | POA: Diagnosis present

## 2018-10-18 DIAGNOSIS — K219 Gastro-esophageal reflux disease without esophagitis: Secondary | ICD-10-CM | POA: Diagnosis present

## 2018-10-18 DIAGNOSIS — Z882 Allergy status to sulfonamides status: Secondary | ICD-10-CM

## 2018-10-18 DIAGNOSIS — I2510 Atherosclerotic heart disease of native coronary artery without angina pectoris: Secondary | ICD-10-CM | POA: Diagnosis present

## 2018-10-18 HISTORY — DX: Acute and chronic respiratory failure with hypoxia: J96.21

## 2018-10-18 HISTORY — DX: Influenza due to other identified influenza virus with other respiratory manifestations: J10.1

## 2018-10-18 LAB — COMPREHENSIVE METABOLIC PANEL
ALK PHOS: 59 U/L (ref 38–126)
ALT: 19 U/L (ref 0–44)
AST: 62 U/L — ABNORMAL HIGH (ref 15–41)
Albumin: 2.9 g/dL — ABNORMAL LOW (ref 3.5–5.0)
Anion gap: 13 (ref 5–15)
BUN: 11 mg/dL (ref 6–20)
CALCIUM: 8 mg/dL — AB (ref 8.9–10.3)
CO2: 27 mmol/L (ref 22–32)
Chloride: 94 mmol/L — ABNORMAL LOW (ref 98–111)
Creatinine, Ser: 0.89 mg/dL (ref 0.44–1.00)
GFR calc Af Amer: >60 mL/min (ref >60)
GFR calc non Af Amer: >60 mL/min (ref >60)
Glucose, Bld: 186 mg/dL — ABNORMAL HIGH (ref 70–99)
Potassium: 3.7 mmol/L (ref 3.5–5.1)
Sodium: 134 mmol/L — ABNORMAL LOW (ref 135–145)
Total Bilirubin: 0.8 mg/dL (ref 0.3–1.2)
Total Protein: 7.1 g/dL (ref 6.5–8.1)

## 2018-10-18 LAB — URINALYSIS, ROUTINE W REFLEX MICROSCOPIC
Bilirubin Urine: NEGATIVE
Glucose, UA: NEGATIVE mg/dL
Hgb urine dipstick: NEGATIVE
Ketones, ur: NEGATIVE mg/dL
LEUKOCYTES UA: NEGATIVE
Nitrite: NEGATIVE
Protein, ur: NEGATIVE mg/dL
Specific Gravity, Urine: 1.019 (ref 1.005–1.030)
pH: 8 (ref 5.0–8.0)

## 2018-10-18 LAB — INFLUENZA PANEL BY PCR (TYPE A & B)
Influenza A By PCR: POSITIVE — AB
Influenza B By PCR: NEGATIVE

## 2018-10-18 LAB — I-STAT TROPONIN, ED: Troponin i, poc: 0.01 ng/mL (ref 0.00–0.08)

## 2018-10-18 LAB — CBC WITH DIFFERENTIAL/PLATELET
Abs Immature Granulocytes: 0.03 10*3/uL (ref 0.00–0.07)
Basophils Absolute: 0 10*3/uL (ref 0.0–0.1)
Basophils Relative: 0 %
Eosinophils Absolute: 0 10*3/uL (ref 0.0–0.5)
Eosinophils Relative: 0 %
HEMATOCRIT: 32.6 % — AB (ref 36.0–46.0)
HEMOGLOBIN: 9.4 g/dL — AB (ref 12.0–15.0)
Immature Granulocytes: 0 %
LYMPHS PCT: 25 %
Lymphs Abs: 1.7 10*3/uL (ref 0.7–4.0)
MCH: 23.2 pg — ABNORMAL LOW (ref 26.0–34.0)
MCHC: 28.8 g/dL — ABNORMAL LOW (ref 30.0–36.0)
MCV: 80.3 fL (ref 80.0–100.0)
MONOS PCT: 7 %
Monocytes Absolute: 0.5 10*3/uL (ref 0.1–1.0)
Neutro Abs: 4.6 10*3/uL (ref 1.7–7.7)
Neutrophils Relative %: 68 %
Platelets: 157 10*3/uL (ref 150–400)
RBC: 4.06 MIL/uL (ref 3.87–5.11)
RDW: 17.1 % — ABNORMAL HIGH (ref 11.5–15.5)
WBC: 6.7 10*3/uL (ref 4.0–10.5)
nRBC: 0 % (ref 0.0–0.2)

## 2018-10-18 LAB — GLUCOSE, CAPILLARY
Glucose-Capillary: 199 mg/dL — ABNORMAL HIGH (ref 70–99)
Glucose-Capillary: 162 mg/dL — ABNORMAL HIGH (ref 70–99)

## 2018-10-18 LAB — BRAIN NATRIURETIC PEPTIDE: B Natriuretic Peptide: 110.1 pg/mL — ABNORMAL HIGH (ref 0.0–100.0)

## 2018-10-18 LAB — PHOSPHORUS: Phosphorus: 1.8 mg/dL — ABNORMAL LOW (ref 2.5–4.6)

## 2018-10-18 LAB — LACTIC ACID, PLASMA: LACTIC ACID, VENOUS: 1.6 mmol/L (ref 0.5–1.9)

## 2018-10-18 LAB — MAGNESIUM: Magnesium: 1.4 mg/dL — ABNORMAL LOW (ref 1.7–2.4)

## 2018-10-18 MED ORDER — IPRATROPIUM-ALBUTEROL 0.5-2.5 (3) MG/3ML IN SOLN
5.0000 mL | Freq: Once | RESPIRATORY_TRACT | Status: AC
  Administered 2018-10-18: 5 mL via RESPIRATORY_TRACT
  Filled 2018-10-18: qty 3

## 2018-10-18 MED ORDER — SODIUM CHLORIDE 0.9 % IV SOLN
2.0000 g | Freq: Two times a day (BID) | INTRAVENOUS | Status: DC
  Administered 2018-10-18 – 2018-10-21 (×6): 2 g via INTRAVENOUS
  Filled 2018-10-18 (×6): qty 2

## 2018-10-18 MED ORDER — IPRATROPIUM-ALBUTEROL 0.5-2.5 (3) MG/3ML IN SOLN
3.0000 mL | Freq: Four times a day (QID) | RESPIRATORY_TRACT | Status: DC | PRN
  Administered 2018-10-19 – 2018-10-20 (×2): 3 mL via RESPIRATORY_TRACT
  Filled 2018-10-18 (×2): qty 3

## 2018-10-18 MED ORDER — ESCITALOPRAM OXALATE 20 MG PO TABS
20.0000 mg | ORAL_TABLET | Freq: Every day | ORAL | Status: DC
  Administered 2018-10-18 – 2018-10-21 (×4): 20 mg via ORAL
  Filled 2018-10-18 (×4): qty 1

## 2018-10-18 MED ORDER — CYCLOBENZAPRINE HCL 10 MG PO TABS
10.0000 mg | ORAL_TABLET | Freq: Three times a day (TID) | ORAL | Status: DC | PRN
  Administered 2018-10-21: 10 mg via ORAL
  Filled 2018-10-18: qty 1

## 2018-10-18 MED ORDER — LACTULOSE 10 GM/15ML PO SOLN
10.0000 g | Freq: Two times a day (BID) | ORAL | Status: DC
  Filled 2018-10-18 (×9): qty 15

## 2018-10-18 MED ORDER — VANCOMYCIN HCL 10 G IV SOLR
2000.0000 mg | Freq: Once | INTRAVENOUS | Status: AC
  Administered 2018-10-18: 2000 mg via INTRAVENOUS
  Filled 2018-10-18: qty 2000

## 2018-10-18 MED ORDER — ALPRAZOLAM 0.5 MG PO TABS
2.0000 mg | ORAL_TABLET | Freq: Every day | ORAL | Status: DC
  Administered 2018-10-18 – 2018-10-21 (×4): 2 mg via ORAL
  Filled 2018-10-18 (×4): qty 4

## 2018-10-18 MED ORDER — PANTOPRAZOLE SODIUM 40 MG PO TBEC
40.0000 mg | DELAYED_RELEASE_TABLET | Freq: Two times a day (BID) | ORAL | Status: DC
  Administered 2018-10-18 – 2018-10-22 (×9): 40 mg via ORAL
  Filled 2018-10-18 (×9): qty 1

## 2018-10-18 MED ORDER — INSULIN ASPART 100 UNIT/ML ~~LOC~~ SOLN
0.0000 [IU] | Freq: Three times a day (TID) | SUBCUTANEOUS | Status: DC
  Administered 2018-10-18: 2 [IU] via SUBCUTANEOUS
  Administered 2018-10-19: 3 [IU] via SUBCUTANEOUS
  Administered 2018-10-19: 2 [IU] via SUBCUTANEOUS
  Administered 2018-10-19 – 2018-10-20 (×2): 5 [IU] via SUBCUTANEOUS
  Administered 2018-10-20: 3 [IU] via SUBCUTANEOUS
  Administered 2018-10-21: 9 [IU] via SUBCUTANEOUS
  Administered 2018-10-21: 7 [IU] via SUBCUTANEOUS
  Administered 2018-10-21: 3 [IU] via SUBCUTANEOUS
  Administered 2018-10-22: 1 [IU] via SUBCUTANEOUS

## 2018-10-18 MED ORDER — METHYLPREDNISOLONE SODIUM SUCC 125 MG IJ SOLR
60.0000 mg | Freq: Three times a day (TID) | INTRAMUSCULAR | Status: DC
  Administered 2018-10-18 – 2018-10-19 (×3): 60 mg via INTRAVENOUS
  Filled 2018-10-18 (×3): qty 2

## 2018-10-18 MED ORDER — BUDESONIDE 0.25 MG/2ML IN SUSP
0.2500 mg | Freq: Two times a day (BID) | RESPIRATORY_TRACT | Status: DC
  Administered 2018-10-18 – 2018-10-22 (×8): 0.25 mg via RESPIRATORY_TRACT
  Filled 2018-10-18 (×9): qty 2

## 2018-10-18 MED ORDER — BENZONATATE 100 MG PO CAPS: 200.0000 mg | ORAL_CAPSULE | Freq: Three times a day (TID) | ORAL | Status: DC | PRN

## 2018-10-18 MED ORDER — ASPIRIN EC 81 MG PO TBEC
81.0000 mg | DELAYED_RELEASE_TABLET | Freq: Every day | ORAL | Status: DC
  Administered 2018-10-18 – 2018-10-22 (×5): 81 mg via ORAL
  Filled 2018-10-18 (×5): qty 1

## 2018-10-18 MED ORDER — OXYCODONE HCL 5 MG PO TABS
10.0000 mg | ORAL_TABLET | ORAL | Status: DC
  Administered 2018-10-18 – 2018-10-22 (×23): 10 mg via ORAL
  Filled 2018-10-18 (×26): qty 2

## 2018-10-18 MED ORDER — LORATADINE 10 MG PO TABS
10.0000 mg | ORAL_TABLET | Freq: Every day | ORAL | Status: DC
  Administered 2018-10-18 – 2018-10-22 (×5): 10 mg via ORAL
  Filled 2018-10-18 (×5): qty 1

## 2018-10-18 MED ORDER — SODIUM CHLORIDE 0.9 % IV SOLN: 2.0000 g | Freq: Once | INTRAVENOUS | Status: DC

## 2018-10-18 MED ORDER — SODIUM CHLORIDE 0.9 % IV BOLUS
500.0000 mL | Freq: Once | INTRAVENOUS | Status: AC
  Administered 2018-10-18: 500 mL via INTRAVENOUS

## 2018-10-18 MED ORDER — ALPRAZOLAM 0.5 MG PO TABS: 1.0000 mg | ORAL_TABLET | ORAL | Status: DC

## 2018-10-18 MED ORDER — ESTRADIOL 2 MG PO TABS
2.0000 mg | ORAL_TABLET | Freq: Every day | ORAL | Status: DC
  Administered 2018-10-18 – 2018-10-22 (×5): 2 mg via ORAL
  Filled 2018-10-18 (×5): qty 1

## 2018-10-18 MED ORDER — SODIUM CHLORIDE 0.9 % IV SOLN
2.0000 g | Freq: Once | INTRAVENOUS | Status: AC
  Administered 2018-10-18: 2 g via INTRAVENOUS
  Filled 2018-10-18: qty 2

## 2018-10-18 MED ORDER — IPRATROPIUM-ALBUTEROL 0.5-2.5 (3) MG/3ML IN SOLN: 3.0000 mL | Freq: Four times a day (QID) | RESPIRATORY_TRACT | Status: DC | PRN

## 2018-10-18 MED ORDER — IPRATROPIUM-ALBUTEROL 0.5-2.5 (3) MG/3ML IN SOLN
3.0000 mL | Freq: Four times a day (QID) | RESPIRATORY_TRACT | Status: DC
  Administered 2018-10-18: 3 mL via RESPIRATORY_TRACT
  Filled 2018-10-18: qty 3

## 2018-10-18 MED ORDER — METFORMIN HCL 500 MG PO TABS
500.0000 mg | ORAL_TABLET | Freq: Two times a day (BID) | ORAL | Status: DC
  Administered 2018-10-18 – 2018-10-20 (×4): 500 mg via ORAL
  Filled 2018-10-18 (×4): qty 1

## 2018-10-18 MED ORDER — IOHEXOL 300 MG/ML  SOLN
100.0000 mL | Freq: Once | INTRAMUSCULAR | Status: AC
  Administered 2018-10-18: 100 mL via INTRAVENOUS

## 2018-10-18 MED ORDER — INSULIN ASPART 100 UNIT/ML ~~LOC~~ SOLN
0.0000 [IU] | Freq: Every day | SUBCUTANEOUS | Status: DC
  Administered 2018-10-19: 2 [IU] via SUBCUTANEOUS
  Administered 2018-10-20 – 2018-10-21 (×2): 3 [IU] via SUBCUTANEOUS

## 2018-10-18 MED ORDER — VANCOMYCIN HCL 10 G IV SOLR
1250.0000 mg | INTRAVENOUS | Status: DC
  Administered 2018-10-19 – 2018-10-20 (×2): 1250 mg via INTRAVENOUS
  Filled 2018-10-18 (×3): qty 1250

## 2018-10-18 MED ORDER — IPRATROPIUM-ALBUTEROL 0.5-2.5 (3) MG/3ML IN SOLN: 3.0000 mL | Freq: Four times a day (QID) | RESPIRATORY_TRACT | Status: DC

## 2018-10-18 MED ORDER — HYDROCOD POLST-CPM POLST ER 10-8 MG/5ML PO SUER: 5.0000 mL | Freq: Two times a day (BID) | ORAL | Status: DC | PRN

## 2018-10-18 MED ORDER — ALPRAZOLAM 0.5 MG PO TABS
1.0000 mg | ORAL_TABLET | Freq: Every day | ORAL | Status: DC
  Administered 2018-10-19 – 2018-10-22 (×4): 1 mg via ORAL
  Filled 2018-10-18 (×4): qty 2

## 2018-10-18 MED ORDER — MONTELUKAST SODIUM 10 MG PO TABS
10.0000 mg | ORAL_TABLET | Freq: Every day | ORAL | Status: DC
  Administered 2018-10-18 – 2018-10-21 (×4): 10 mg via ORAL
  Filled 2018-10-18 (×4): qty 1

## 2018-10-18 MED ORDER — BENAZEPRIL HCL 40 MG PO TABS
40.0000 mg | ORAL_TABLET | Freq: Every day | ORAL | Status: DC
  Administered 2018-10-18 – 2018-10-22 (×5): 40 mg via ORAL
  Filled 2018-10-18 (×5): qty 1

## 2018-10-18 MED ORDER — BUTALBITAL-APAP-CAFFEINE 50-325-40 MG PO TABS
2.0000 | ORAL_TABLET | Freq: Once | ORAL | Status: AC
  Administered 2018-10-18: 2 via ORAL
  Filled 2018-10-18: qty 2

## 2018-10-18 MED ORDER — OSELTAMIVIR PHOSPHATE 75 MG PO CAPS
75.0000 mg | ORAL_CAPSULE | Freq: Two times a day (BID) | ORAL | Status: DC
  Administered 2018-10-18 – 2018-10-22 (×9): 75 mg via ORAL
  Filled 2018-10-18 (×9): qty 1

## 2018-10-18 MED ORDER — HYDROMORPHONE HCL 1 MG/ML IJ SOLN
1.0000 mg | Freq: Once | INTRAMUSCULAR | Status: AC
  Administered 2018-10-18: 1 mg via INTRAVENOUS
  Filled 2018-10-18: qty 1

## 2018-10-18 MED ORDER — SIMVASTATIN 20 MG PO TABS
20.0000 mg | ORAL_TABLET | Freq: Every day | ORAL | Status: DC
  Administered 2018-10-18 – 2018-10-21 (×4): 20 mg via ORAL
  Filled 2018-10-18 (×4): qty 1

## 2018-10-18 MED ORDER — ONDANSETRON HCL 4 MG PO TABS: 8.0000 mg | ORAL_TABLET | Freq: Three times a day (TID) | ORAL | Status: DC | PRN

## 2018-10-18 MED ORDER — ENOXAPARIN SODIUM 40 MG/0.4ML ~~LOC~~ SOLN
40.0000 mg | SUBCUTANEOUS | Status: DC
  Administered 2018-10-18 – 2018-10-21 (×4): 40 mg via SUBCUTANEOUS
  Filled 2018-10-18 (×4): qty 0.4

## 2018-10-18 MED ORDER — LEVOCETIRIZINE DIHYDROCHLORIDE 5 MG PO TABS: 5.0000 mg | ORAL_TABLET | Freq: Every day | ORAL | Status: DC

## 2018-10-18 MED ORDER — IPRATROPIUM-ALBUTEROL 0.5-2.5 (3) MG/3ML IN SOLN
3.0000 mL | Freq: Two times a day (BID) | RESPIRATORY_TRACT | Status: DC
  Administered 2018-10-18 – 2018-10-19 (×2): 3 mL via RESPIRATORY_TRACT
  Filled 2018-10-18 (×2): qty 3
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
## 2018-10-18 NOTE — ED Notes (Signed)
Xray currently in room. 

## 2018-10-18 NOTE — ED Notes (Signed)
Patient returned from CT

## 2018-10-18 NOTE — ED Notes (Signed)
Patient transported to CT 

## 2018-10-18 NOTE — ED Notes (Signed)
Neb tx completed, pt requesting a shot of Demerol at this time.  Pt advised we do not have Demerol on formulary, pt reports she needs the medication that starts with a D she received the other day to help her pain

## 2018-10-18 NOTE — Progress Notes (Addendum)
Pharmacy Antibiotic Note  [name] is a 58 y.o. female admitted on 10/18/2018 with pneumonia.  Pharmacy has been consulted for vancomycin dosing. Also with 1x dose of aztreonam ordered - PCN allergy documented as "nausea, vomiting" and has tolerated both cephalosporins and Zosyn here several times in the past. Discussed with EDP - will change to cefepime. Tmax 103.1, WBC wnl. SCr 0.89 on admit.  Plan: Vancomycin 2g IV x 1; then Vancomycin 1250 mg IV Q 24 hrs. Goal AUC 400-550. Expected AUC: 503 SCr used: 0.89 Cefepime 2g IV x 1 per EDP - f/u need to continue? Monitor clinical progress, c/s, renal function F/u de-escalation plan/LOT, vancomycin levels as indicated  ADDENDUM:  Pharmacy consulted to continue cefepime.  Plan: Continue cefepime 2g IV q12h Continue vancomycin 1250mg  IV q24h Monitor clinical progress, c/s, renal function F/u de-escalation plan/LOT, vancomycin trough as indicated      Temp (24hrs), Avg:103.1 F (39.5 C), Min:103.1 F (39.5 C), Max:103.1 F (39.5 C)  Recent Labs  Lab 10/15/18 0300 10/18/18 0641 10/18/18 0645  WBC 8.2  --  6.7  CREATININE 0.79  --   --   LATICACIDVEN  --  1.6  --     Estimated Creatinine Clearance: 84.3 mL/min (by C-G formula based on SCr of 0.79 mg/dL).    Allergies  Allergen Reactions  . Amoxicillin Nausea And Vomiting  . Clindamycin/Lincomycin Nausea And Vomiting  . Doxycycline Nausea And Vomiting  . Treximet [Sumatriptan-Naproxen Sodium] Nausea And Vomiting  . Bactrim Hives and Rash    Elicia Lamp, PharmD, BCPS Please check AMION for all [facility] contact numbers Clinical Pharmacist 10/18/2018 7:59 AM
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
## 2018-10-19 DIAGNOSIS — T380X5A Adverse effect of glucocorticoids and synthetic analogues, initial encounter: Secondary | ICD-10-CM | POA: Diagnosis not present

## 2018-10-19 DIAGNOSIS — I2510 Atherosclerotic heart disease of native coronary artery without angina pectoris: Secondary | ICD-10-CM | POA: Diagnosis present

## 2018-10-19 DIAGNOSIS — E785 Hyperlipidemia, unspecified: Secondary | ICD-10-CM | POA: Diagnosis present

## 2018-10-19 DIAGNOSIS — R0602 Shortness of breath: Secondary | ICD-10-CM | POA: Diagnosis present

## 2018-10-19 DIAGNOSIS — K5900 Constipation, unspecified: Secondary | ICD-10-CM | POA: Diagnosis not present

## 2018-10-19 DIAGNOSIS — I4581 Long QT syndrome: Secondary | ICD-10-CM | POA: Diagnosis present

## 2018-10-19 DIAGNOSIS — J1008 Influenza due to other identified influenza virus with other specified pneumonia: Secondary | ICD-10-CM | POA: Diagnosis present

## 2018-10-19 DIAGNOSIS — D649 Anemia, unspecified: Secondary | ICD-10-CM | POA: Diagnosis present

## 2018-10-19 DIAGNOSIS — J441 Chronic obstructive pulmonary disease with (acute) exacerbation: Secondary | ICD-10-CM | POA: Diagnosis not present

## 2018-10-19 DIAGNOSIS — Y95 Nosocomial condition: Secondary | ICD-10-CM | POA: Diagnosis present

## 2018-10-19 DIAGNOSIS — J9621 Acute and chronic respiratory failure with hypoxia: Secondary | ICD-10-CM | POA: Diagnosis not present

## 2018-10-19 DIAGNOSIS — J189 Pneumonia, unspecified organism: Secondary | ICD-10-CM

## 2018-10-19 DIAGNOSIS — J440 Chronic obstructive pulmonary disease with acute lower respiratory infection: Secondary | ICD-10-CM | POA: Diagnosis present

## 2018-10-19 DIAGNOSIS — Y92230 Patient room in hospital as the place of occurrence of the external cause: Secondary | ICD-10-CM | POA: Diagnosis not present

## 2018-10-19 DIAGNOSIS — G4733 Obstructive sleep apnea (adult) (pediatric): Secondary | ICD-10-CM | POA: Diagnosis present

## 2018-10-19 DIAGNOSIS — J181 Lobar pneumonia, unspecified organism: Secondary | ICD-10-CM | POA: Diagnosis present

## 2018-10-19 DIAGNOSIS — J101 Influenza due to other identified influenza virus with other respiratory manifestations: Secondary | ICD-10-CM | POA: Diagnosis not present

## 2018-10-19 DIAGNOSIS — Z6841 Body Mass Index (BMI) 40.0 and over, adult: Secondary | ICD-10-CM | POA: Diagnosis not present

## 2018-10-19 DIAGNOSIS — R1310 Dysphagia, unspecified: Secondary | ICD-10-CM | POA: Diagnosis present

## 2018-10-19 DIAGNOSIS — I10 Essential (primary) hypertension: Secondary | ICD-10-CM | POA: Diagnosis not present

## 2018-10-19 DIAGNOSIS — R109 Unspecified abdominal pain: Secondary | ICD-10-CM | POA: Diagnosis present

## 2018-10-19 DIAGNOSIS — E1165 Type 2 diabetes mellitus with hyperglycemia: Secondary | ICD-10-CM | POA: Diagnosis not present

## 2018-10-19 DIAGNOSIS — K219 Gastro-esophageal reflux disease without esophagitis: Secondary | ICD-10-CM | POA: Diagnosis present

## 2018-10-19 DIAGNOSIS — K7460 Unspecified cirrhosis of liver: Secondary | ICD-10-CM | POA: Diagnosis present

## 2018-10-19 DIAGNOSIS — G894 Chronic pain syndrome: Secondary | ICD-10-CM | POA: Diagnosis present

## 2018-10-19 DIAGNOSIS — G43909 Migraine, unspecified, not intractable, without status migrainosus: Secondary | ICD-10-CM | POA: Diagnosis present

## 2018-10-19 LAB — URINE CULTURE

## 2018-10-19 LAB — CBC
HCT: 30.6 % — ABNORMAL LOW (ref 36.0–46.0)
Hemoglobin: 9.2 g/dL — ABNORMAL LOW (ref 12.0–15.0)
MCH: 23.9 pg — ABNORMAL LOW (ref 26.0–34.0)
MCHC: 30.1 g/dL (ref 30.0–36.0)
MCV: 79.5 fL — ABNORMAL LOW (ref 80.0–100.0)
Platelets: 159 10*3/uL (ref 150–400)
RBC: 3.85 MIL/uL — ABNORMAL LOW (ref 3.87–5.11)
RDW: 17.2 % — ABNORMAL HIGH (ref 11.5–15.5)
WBC: 6.9 10*3/uL (ref 4.0–10.5)
nRBC: 0 % (ref 0.0–0.2)

## 2018-10-19 LAB — BASIC METABOLIC PANEL
Anion gap: 11 (ref 5–15)
BUN: 9 mg/dL (ref 6–20)
CO2: 28 mmol/L (ref 22–32)
Calcium: 8.2 mg/dL — ABNORMAL LOW (ref 8.9–10.3)
Chloride: 98 mmol/L (ref 98–111)
Creatinine, Ser: 0.77 mg/dL (ref 0.44–1.00)
GFR calc Af Amer: >60 mL/min (ref >60)
GFR calc non Af Amer: >60 mL/min (ref >60)
Glucose, Bld: 175 mg/dL — ABNORMAL HIGH (ref 70–99)
Potassium: 3.9 mmol/L (ref 3.5–5.1)
Sodium: 137 mmol/L (ref 135–145)

## 2018-10-19 LAB — GLUCOSE, CAPILLARY
Glucose-Capillary: 155 mg/dL — ABNORMAL HIGH (ref 70–99)
Glucose-Capillary: 269 mg/dL — ABNORMAL HIGH (ref 70–99)
Glucose-Capillary: 211 mg/dL — ABNORMAL HIGH (ref 70–99)
Glucose-Capillary: 236 mg/dL — ABNORMAL HIGH (ref 70–99)

## 2018-10-19 LAB — HIV ANTIBODY (ROUTINE TESTING W REFLEX): HIV Screen 4th Generation wRfx: NONREACTIVE

## 2018-10-19 MED ORDER — POLYETHYLENE GLYCOL 3350 17 G PO PACK
17.0000 g | PACK | Freq: Two times a day (BID) | ORAL | Status: AC
  Administered 2018-10-19 (×2): 17 g via ORAL
  Filled 2018-10-19 (×2): qty 1

## 2018-10-19 MED ORDER — K PHOS MONO-SOD PHOS DI & MONO 155-852-130 MG PO TABS
500.0000 mg | ORAL_TABLET | Freq: Two times a day (BID) | ORAL | Status: AC
  Administered 2018-10-19 – 2018-10-20 (×2): 500 mg via ORAL
  Filled 2018-10-19 (×2): qty 2

## 2018-10-19 MED ORDER — SODIUM CHLORIDE 0.9 % IV SOLN
INTRAVENOUS | Status: DC | PRN
  Administered 2018-10-19: 250 mL via INTRAVENOUS

## 2018-10-19 MED ORDER — ALBUTEROL SULFATE (2.5 MG/3ML) 0.083% IN NEBU: 2.5000 mg | INHALATION_SOLUTION | RESPIRATORY_TRACT | Status: DC

## 2018-10-19 MED ORDER — MAGNESIUM OXIDE 400 (241.3 MG) MG PO TABS
400.0000 mg | ORAL_TABLET | Freq: Two times a day (BID) | ORAL | Status: AC
  Administered 2018-10-19 – 2018-10-20 (×2): 400 mg via ORAL
  Filled 2018-10-19 (×2): qty 1

## 2018-10-19 MED ORDER — ALBUTEROL SULFATE (2.5 MG/3ML) 0.083% IN NEBU
2.5000 mg | INHALATION_SOLUTION | RESPIRATORY_TRACT | Status: DC | PRN
  Administered 2018-10-20 – 2018-10-21 (×2): 2.5 mg via RESPIRATORY_TRACT
  Filled 2018-10-19 (×2): qty 3

## 2018-10-19 MED ORDER — METHYLPREDNISOLONE SODIUM SUCC 40 MG IJ SOLR
40.0000 mg | Freq: Two times a day (BID) | INTRAMUSCULAR | Status: DC
  Administered 2018-10-19 – 2018-10-21 (×4): 40 mg via INTRAVENOUS
  Filled 2018-10-19 (×4): qty 1

## 2018-10-19 MED ORDER — GUAIFENESIN-DM 100-10 MG/5ML PO SYRP
5.0000 mL | ORAL_SOLUTION | ORAL | Status: DC | PRN
  Administered 2018-10-19 – 2018-10-21 (×5): 5 mL via ORAL
  Filled 2018-10-19 (×5): qty 5

## 2018-10-19 NOTE — Care Management Note (Signed)
Case Management Note Manya Silvas RN MSN CCM Transitions of Care [address] [place] [phone]  Patient Details  Name: [name] MRN: [id number] Date of Birth: 09-03-1961  Subjective/Objective:    Acute on chronic respiratory failure             Action/Plan: PTA home with spouse. Recent discharge. Pt uses mail order for most prescriptions which helps with cost. Pt states she doesn't use mail order for her percocet or her hormone pills. For DME, she has O2 from [facility] from her recent hospital stay, but also has a cane and a bedside commode. Pt states her husband assists with adls and transportation to medical appointments. Pt expressed concerns about medical bills and wanting assisting to apply for medicaid. Discussed that pt needs to go to DSS to talk with a worker in order to apply. Discussed that when bills come to call providers and ask about charity opitons. Pt also stated that a food bank delivers to her home on the 15th of the month. Pt is in the process of moving into a new rental home stating that her current home is paid up until the end of the month, but she can move into the new home around the 15th. CM to continue to follow for transition of care needs.   Expected Discharge Date:                  Expected Discharge Plan:  [place]  In-House Referral:     Discharge planning Services  CM Consult  Post Acute Care Choice:    Choice offered to:     DME Arranged:    DME Agency:     HH Arranged:    [place] Agency:     Status of Service:  In process, will continue to follow  If discussed at Long Length of Stay Meetings, dates discussed:    Additional Comments:  Bartholomew Crews, RN 10/19/2018, 5:05 PM

## 2018-10-19 NOTE — Progress Notes (Signed)
[facility] PROGRESS NOTE    Progress Note  [name] BRANSCOME  YDX:[id number] DOB: [birth year] DOA: 10/18/2018 PCP: Shirline Frees, MD     Brief Narrative:   [name] is an 58 y.o. female past medical history of COPD tobacco abuse perforated bowel hyperlipidemia liver cirrhosis diabetes mellitus morbid obese recently discharged from the hospital 3 days after brief episode of COPD, discharged home on 3 L comes in for progressive shortness of breath and wheezing since discharge, since then she is also developed a fever associated with a nonproductive cough.  In the ED she was found to have a temperature of 103.1 with a mild leukocytosis  Assessment/Plan:   Acute on chronic respiratory failure with hypoxia due to COPD exacerbation the setting influenza A infection possibly middle lobe pneumonia: She was admitted to the hospital started on IV Solu-Medrol, inhalers and empiric antibiotics. She was also started on Tamiflu. She has defervesced continue conservative management.  Left middle lobe pneumonia: Recently discharged from the hospital, she treated for H CAP. Started IV vancomycin and cefepime on 10/18/2018. She has defervesced.  Essential hypertension Pressure currently stable continue current management.  Morbid obesity ([place]): Counseling was performed.  Chronic pain: The patient on 2 separate occasions during my interview requested additional IV narcotics in the form of Tussionex and then again for headaches IV Dilaudid. I told her we cannot give her any additional narcotics that includes IV. I have also explained to her we cannot give her any additional benzodiazepines or Phenergan.  She could have Zofran for nausea.  DVT prophylaxis: heparin Family Communication:none Disposition Plan/Barrier to D/C: once infection control Code Status:     Code Status Orders  (From admission, onward)         Start     Ordered   10/18/18 1231  Full code  Continuous     10/18/18  1231        Code Status History    Date Active Date Inactive Code Status Order ID Comments User Context   10/15/2018 0519 10/15/2018 1654 Full Code [id number]  Phillips Grout, MD ED   10/12/2016 0931 10/14/2016 1920 Full Code [id number]  Rondel Jumbo, PA-C ED   10/12/2016 0903 10/12/2016 0931 Full Code [id number]  Rondel Jumbo, PA-C ED   12/09/2014 1339 12/11/2014 1448 Full Code [id number]  Modena Jansky, MD Inpatient   07/10/2014 2320 07/12/2014 1840 Full Code [id number]  Shanda Howells, MD ED   03/07/2013 1638 03/09/2013 1806 Full Code [id number]  Babish, Lucille Passy, PA-C Inpatient        IV Access:    Peripheral IV   Procedures and diagnostic studies:   Ct Abdomen Pelvis W Contrast  Result Date: 10/18/2018 CLINICAL DATA:  Diffuse lower abdominal pain since a fall 10 days ago. EXAM: CT ABDOMEN AND PELVIS WITH CONTRAST TECHNIQUE: Multidetector CT imaging of the abdomen and pelvis was performed using the standard protocol following bolus administration of intravenous contrast. CONTRAST:  100 cc OMNIPAQUE IOHEXOL 300 MG/ML  SOLN COMPARISON:  12/09/2014 FINDINGS: Lower chest: Bibasilar airspace opacity, left greater than right, atelectasis versus infiltrates. No pleural effusion. The heart is normal in size. No pericardial effusion. Small cluster of lymph nodes are noted in the epicardial region typical with cirrhosis. Hepatobiliary: Cirrhotic changes involving the liver appear relatively stable. No worrisome hepatic lesions or intrahepatic biliary dilatation. The gallbladder is slightly distended and surrounded by fluid. No common bile duct dilatation. Pancreas: No mass, inflammation or ductal dilatation. Spleen: The  spleen is upper limits of normal in size. Stable splenic cyst. Adrenals/Urinary Tract: Adrenal glands and kidneys are unremarkable. The bladder is normal. Stomach/Bowel: The stomach, duodenum, small bowel and colon are unremarkable. No acute inflammatory changes, mass lesions or  obstructive findings. Moderate stool throughout the colon could suggest constipation. The terminal ileum appears normal. The appendix is not identified for certain. Possible prior appendectomy. Vascular/Lymphatic: Borderline enlarged gastrohepatic ligament, celiac axis and periportal lymph nodes, typical for cirrhosis. The aorta and branch vessels are patent. The major venous structures are patent. There are changes of portal venous hypertension with portal venous collaterals. No esophageal varices. Reproductive: Surgically absent. Other: Moderate volume abdominal/pelvic ascites. Musculoskeletal: No significant bony findings. Bilateral pars defects are noted at L5 with a grade 1 spondylolisthesis and associated degenerative disc disease at L5-S1. IMPRESSION: 1. No acute abdominal/pelvic findings, mass lesions or lymphadenopathy. 2. Stable cirrhotic changes involving the liver with portal venous hypertension and portal venous collaterals and moderate abdominal/pelvic ascites. 3. Bibasilar atelectasis versus infiltrates. 4. Epicardial, gastrohepatic ligament, celiac axis and periportal lymph nodes typical for cirrhosis. Electronically Signed   By: Marijo Sanes M.D.   On: 10/18/2018 08:05   Dg Chest Portable 1 View  Result Date: 10/18/2018 CLINICAL DATA:  Cough and shortness of breath EXAM: PORTABLE CHEST 1 VIEW COMPARISON:  October 15, 2018 FINDINGS: There is focal airspace consolidation in the left mid lung region. The lungs elsewhere are clear. Heart is upper normal in size with pulmonary vascularity normal. No adenopathy. No bone lesions. IMPRESSION: Airspace consolidation felt to represent pneumonia in the left mid lung. Lungs elsewhere clear. Heart upper normal in size. Followup PA and lateral chest radiographs recommended in 3-4 weeks following trial of antibiotic therapy to ensure resolution and exclude underlying malignancy. Electronically Signed   By: Lowella Grip III M.D.   On: 10/18/2018 07:14      Medical Consultants:    None.  Anti-Infectives:   Vancomycin and cefepime.  Subjective:    [name] is very demanding about wanting narcotics, the first thing she said when I went into the room he says she was having pain all, yet she omitted to say that she just received oral oxycodone.  Objective:    Vitals:   10/18/18 1611 10/18/18 2059 10/19/18 0516 10/19/18 0802  BP: 121/77 140/67 138/79   Pulse: 79 94 77   Resp: 20 18 18 18   Temp: 98.2 F (36.8 C) (!) 100.4 F (38 C) 98.2 F (36.8 C)   TempSrc: Oral Oral Oral   SpO2: 97% 91% 100%   Weight:   104.3 kg   Height:        Intake/Output Summary (Last 24 hours) at 10/19/2018 0828 Last data filed at 10/19/2018 0600 Gross per 24 hour  Intake 820 ml  Output 1500 ml  Net -680 ml   Filed Weights   10/18/18 1438 10/19/18 0516  Weight: 121.2 kg 104.3 kg    Exam: General exam: In no acute distress. Respiratory system: Good air movement wheezing bilaterally.. Cardiovascular system: S1 & S2 heard, RRR.  Gastrointestinal system: Abdomen is nondistended, soft and nontender.  Central nervous system: Alert and oriented. No focal neurological deficits. Extremities: No pedal edema. Skin: No rashes, lesions or ulcers Psychiatry: Judgement and insight appear normal. Mood & affect appropriate.    Data Reviewed:    Labs: Basic Metabolic Panel: Recent Labs  Lab 10/15/18 0300 10/18/18 0645  NA 139 134*  K 3.9 3.7  CL 99 94*  CO2 31 27  GLUCOSE 106* 186*  BUN 6 11  CREATININE 0.79 0.89  CALCIUM 8.7* 8.0*  MG  --  1.4*  PHOS  --  1.8*   GFR Estimated Creatinine Clearance: 77.1 mL/min (by C-G formula based on SCr of 0.89 mg/dL). Liver Function Tests: Recent Labs  Lab 10/18/18 0645  AST 62*  ALT 19  ALKPHOS 59  BILITOT 0.8  PROT 7.1  ALBUMIN 2.9*   No results for input(s): LIPASE, AMYLASE in the last 168 hours. No results for input(s): AMMONIA in the last 168 hours. Coagulation profile No  results for input(s): INR, PROTIME in the last 168 hours.  CBC: Recent Labs  Lab 10/15/18 0300 10/18/18 0645 10/19/18 0702  WBC 8.2 6.7 6.9  NEUTROABS  --  4.6  --   HGB 10.0* 9.4* 9.2*  HCT 35.5* 32.6* 30.6*  MCV 81.2 80.3 79.5*  PLT 221 157 159   Cardiac Enzymes: No results for input(s): CKTOTAL, CKMB, CKMBINDEX, TROPONINI in the last 168 hours. BNP (last 3 results) No results for input(s): PROBNP in the last 8760 hours. CBG: Recent Labs  Lab 10/18/18 1637 10/18/18 2123 10/19/18 0649  GLUCAP 199* 162* 155*   D-Dimer: No results for input(s): DDIMER in the last 72 hours. Hgb A1c: No results for input(s): HGBA1C in the last 72 hours. Lipid Profile: No results for input(s): CHOL, HDL, LDLCALC, TRIG, CHOLHDL, LDLDIRECT in the last 72 hours. Thyroid function studies: No results for input(s): TSH, T4TOTAL, T3FREE, THYROIDAB in the last 72 hours.  Invalid input(s): FREET3 Anemia work up: No results for input(s): VITAMINB12, FOLATE, FERRITIN, TIBC, IRON, RETICCTPCT in the last 72 hours. Sepsis Labs: Recent Labs  Lab 10/15/18 0300 10/18/18 0641 10/18/18 0645 10/19/18 0702  WBC 8.2  --  6.7 6.9  LATICACIDVEN  --  1.6  --   --    Microbiology Recent Results (from the past 240 hour(s))  Blood culture (routine x 2)     Status: None (Preliminary result)   Collection Time: 10/18/18  6:50 AM  Result Value Ref Range Status   Specimen Description BLOOD RIGHT HAND  Final   Special Requests   Final    BOTTLES DRAWN AEROBIC AND ANAEROBIC Blood Culture adequate volume   Culture   Final    NO GROWTH < 24 HOURS Performed at [facility], [address], [place], [place] [address]    Report Status PENDING  Incomplete  Blood culture (routine x 2)     Status: None (Preliminary result)   Collection Time: 10/18/18  7:54 AM  Result Value Ref Range Status   Specimen Description BLOOD SITE NOT SPECIFIED  Final   Special Requests   Final    BOTTLES DRAWN AEROBIC AND  ANAEROBIC Blood Culture adequate volume   Culture   Final    NO GROWTH < 24 HOURS Performed at [facility], [place] [address], [place], [place] [address]    Report Status PENDING  Incomplete  Urine culture     Status: Abnormal   Collection Time: 10/18/18  9:16 AM  Result Value Ref Range Status   Specimen Description URINE, CATHETERIZED  Final   Special Requests   Final    NONE Performed at [facility], [place] [address], [place], [place] [address]    Culture MULTIPLE SPECIES PRESENT, SUGGEST RECOLLECTION (A)  Final   Report Status 10/19/2018 FINAL  Final     Medications:   . ALPRAZolam  1 mg  Oral Daily   And  . ALPRAZolam  2 mg Oral QHS  . aspirin EC  81 mg Oral Daily  . benazepril  40 mg Oral Daily  . budesonide (PULMICORT) nebulizer solution  0.25 mg Nebulization BID  . enoxaparin (LOVENOX) injection  40 mg Subcutaneous Q24H  . escitalopram  20 mg Oral QHS  . estradiol  2 mg Oral Daily  . insulin aspart  0-5 Units Subcutaneous QHS  . insulin aspart  0-9 Units Subcutaneous TID WC  . ipratropium-albuterol  3 mL Nebulization BID  . lactulose  10 g Oral BID  . loratadine  10 mg Oral Daily  . metFORMIN  500 mg Oral BID WC  . methylPREDNISolone (SOLU-MEDROL) injection  60 mg Intravenous Q8H  . montelukast  10 mg Oral QHS  . oseltamivir  75 mg Oral BID  . oxyCODONE  10 mg Oral Q4H  . pantoprazole  40 mg Oral BID  . simvastatin  20 mg Oral QHS   Continuous Infusions: . ceFEPime (MAXIPIME) IV 2 g (10/18/18 1958)  . vancomycin       LOS: 1 day   Charlynne Cousins  [facility]  10/19/2018, 8:28 AM

## 2018-10-20 DIAGNOSIS — J181 Lobar pneumonia, unspecified organism: Secondary | ICD-10-CM

## 2018-10-20 LAB — GLUCOSE, CAPILLARY
Glucose-Capillary: 120 mg/dL — ABNORMAL HIGH (ref 70–99)
Glucose-Capillary: 268 mg/dL — ABNORMAL HIGH (ref 70–99)
Glucose-Capillary: 243 mg/dL — ABNORMAL HIGH (ref 70–99)
Glucose-Capillary: 278 mg/dL — ABNORMAL HIGH (ref 70–99)

## 2018-10-20 LAB — MAGNESIUM: Magnesium: 1.6 mg/dL — ABNORMAL LOW (ref 1.7–2.4)

## 2018-10-20 LAB — MRSA PCR SCREENING: MRSA by PCR: NEGATIVE

## 2018-10-20 LAB — PHOSPHORUS: Phosphorus: 2 mg/dL — ABNORMAL LOW (ref 2.5–4.6)

## 2018-10-20 MED ORDER — MINERAL OIL RE ENEM
1.0000 | ENEMA | Freq: Once | RECTAL | Status: AC
  Administered 2018-10-21: 1 via RECTAL
  Filled 2018-10-20: qty 1

## 2018-10-20 MED ORDER — MAGNESIUM SULFATE 2 GM/50ML IV SOLN
2.0000 g | Freq: Once | INTRAVENOUS | Status: AC
  Administered 2018-10-20: 2 g via INTRAVENOUS
  Filled 2018-10-20: qty 50

## 2018-10-20 MED ORDER — GUAIFENESIN ER 600 MG PO TB12
600.0000 mg | ORAL_TABLET | Freq: Two times a day (BID) | ORAL | Status: DC
  Administered 2018-10-20 – 2018-10-22 (×5): 600 mg via ORAL
  Filled 2018-10-20 (×5): qty 1

## 2018-10-20 MED ORDER — POLYETHYLENE GLYCOL 3350 17 G PO PACK
17.0000 g | PACK | Freq: Two times a day (BID) | ORAL | Status: DC
  Administered 2018-10-20 – 2018-10-22 (×5): 17 g via ORAL
  Filled 2018-10-20 (×5): qty 1

## 2018-10-20 NOTE — Progress Notes (Signed)
Refused to ambulate to check for her oxygen saturation.

## 2018-10-20 NOTE — Progress Notes (Addendum)
Physical Therapy Evaluation Patient Details Name: [name] MRN: [id number] DOB: 10-03-1960 Today's Date: 10/20/2018   History of Present Illness  Patient is 59 y/o female presenting to hospital with acute on chronic respiratory failure secondary to pneumonia and + influenza A. Patient had recent hospitilization on 1/31 due to COPD exacerbation. PMH includes COPD, HTN, morbid obesity, cirrhosis of liver, DM, asthma, OSA, and perforated bowel.   Clinical Impression  Patient admitted to hospital secondary to problems above and with deficits below. Patient with unsteadiness with short distance ambulation without AD requiring supervision-min guard. Patient very anxious during session reporting difficulty breathing and refusing further mobility. DOE at 1/4. Educated patient about pursed lip breathing technique. Oxygen saturations ranging from 92-97% on RA throughout session. Given functional mobility and activity tolerance limitations, recommending HHPT. Patient would benefit from acute physical therapy services to maximize independence and safety with functional mobility.     Follow Up Recommendations Home health PT    Equipment Recommendations  Other (comment)(TBD)    Recommendations for Other Services       Precautions / Restrictions Precautions Precautions: Fall Restrictions Weight Bearing Restrictions: No      Mobility  Bed Mobility               General bed mobility comments: Patient on BSC upon arrival  Transfers Overall transfer level: Needs assistance Equipment used: None Transfers: Sit to/from Stand Sit to Stand: Supervision         General transfer comment: Patient required supervision for safety to stand   Ambulation/Gait Ambulation/Gait assistance: Supervision;Min guard Gait Distance (Feet): 3 Feet Assistive device: None Gait Pattern/deviations: Step-through pattern Gait velocity: decreased Gait velocity interpretation: <1.8 ft/sec, indicate of risk for  recurrent falls General Gait Details: Ambulated short distance from [facility] to chair with slight LOB. Required supervision to min guard for safety. Patient refusing further ambulation due to diffculty breathing. Oxygen saturations 92-97% on RA throughout session.  Stairs            Wheelchair Mobility    Modified Rankin (Stroke Patients Only)       Balance Overall balance assessment: Needs assistance Sitting-balance support: Feet supported Sitting balance-Leahy Scale: Good     Standing balance support: No upper extremity supported Standing balance-Leahy Scale: Fair Standing balance comment: Slight LOB during gait tasks                             Pertinent Vitals/Pain Pain Assessment: No/denies pain    Home Living Family/patient expects to be discharged to:: Private residence Living Arrangements: Spouse/significant other Available Help at Discharge: Family;Available 24 hours/day Type of Home: House Home Access: Stairs to enter Entrance Stairs-Rails: None Entrance Stairs-Number of Steps: 3 Home Layout: One level Home Equipment: Cane - single point;Bedside commode      Prior Function Level of Independence: Needs assistance   Gait / Transfers Assistance Needed: Occasionally uses a cane  ADL's / Homemaking Assistance Needed: independent in bathing/dressing, husband assist with iADLs  Comments: ADL information taken from previous notes     Hand Dominance        Extremity/Trunk Assessment   Upper Extremity Assessment Upper Extremity Assessment: Overall WFL for tasks assessed    Lower Extremity Assessment Lower Extremity Assessment: Generalized weakness    Cervical / Trunk Assessment Cervical / Trunk Assessment: Normal  Communication   Communication: No difficulties  Cognition Arousal/Alertness: Awake/alert Behavior During Therapy: Anxious Overall Cognitive Status: Within Functional  Limits for tasks assessed                                  General Comments: Patient was very anxious during session secondary to reports of difficulty breathing. Did not notice any severe SOB and oxygen saturations ranging from 92-97% on RA. Respirtory therapist in room at end of session to give breathing treatment      General Comments General comments (skin integrity, edema, etc.): Respiratory therapist in room at end of session to give breathing treatment. Educated about pursed lip breathing technique to calm anxiety.     Exercises     Assessment/Plan    PT Assessment Patient needs continued PT services  PT Problem List Decreased strength;Decreased range of motion;Decreased activity tolerance;Decreased balance;Decreased mobility;Decreased knowledge of use of DME;Cardiopulmonary status limiting activity;Decreased knowledge of precautions       PT Treatment Interventions DME instruction;Gait training;Stair training;Functional mobility training;Therapeutic activities;Therapeutic exercise;Balance training;Patient/family education    PT Goals (Current goals can be found in the Care Plan section)  Acute Rehab PT Goals Patient Stated Goal: breathe better PT Goal Formulation: With patient Time For Goal Achievement: 11/03/18 Potential to Achieve Goals: Good    Frequency Min 3X/week   Barriers to discharge        Co-evaluation               AM-PAC PT "6 Clicks" Mobility  Outcome Measure Help needed turning from your back to your side while in a flat bed without using bedrails?: None Help needed moving from lying on your back to sitting on the side of a flat bed without using bedrails?: None Help needed moving to and from a bed to a chair (including a wheelchair)?: A Little Help needed standing up from a chair using your arms (e.g., wheelchair or bedside chair)?: A Little Help needed to walk in hospital room?: A Little Help needed climbing 3-5 steps with a railing? : A Little 6 Click Score: 20    End of Session    Activity Tolerance: Treatment limited secondary to medical complications (Comment)(anxiety and reports of difficulty breathing) Patient left: in chair;with call bell/phone within reach;Other (comment)(respiratory therapist in room) Nurse Communication: Mobility status PT Visit Diagnosis: Unsteadiness on feet (R26.81);Muscle weakness (generalized) (M62.81);Difficulty in walking, not elsewhere classified (R26.2)    Time: 1345-1400 PT Time Calculation (min) (ACUTE ONLY): 15 min   Charges:   PT Evaluation $PT Eval Low Complexity: 1 Low          Erick Blinks, SPT  Erick Blinks 10/20/2018, 3:35 PM

## 2018-10-20 NOTE — Progress Notes (Signed)
PROGRESS NOTE  [name] HQP:[id number] DOB: [birth year] DOA: 10/18/2018 PCP: Shirline Frees, MD  HPI/Recap of past 24 hours:  No fever last 24hrs Nonproductive cough, on 2liter o2 at rest  Reports being constipated  Assessment/Plan: Active Problems:   Hypertension   Morbid obesity ([place])   Cirrhosis of liver: per CT   COPD exacerbation ([name])   Influenza A   Acute on chronic respiratory failure with hypoxia ([place])   Healthcare-associated pneumonia   Acute on chronic respiratory failure with hypoxia due to COPD exacerbation the setting influenza A infection possibly middle lobe pneumonia: -she was recently discharged home of o2 supplement for COPD She was admitted to the hospital started on IV Solu-Medrol, inhalers and empiric antibiotics. She was also started on Tamiflu. She has defervesced continue conservative management. Wean oxygen, ambulate  She declined flu shot due to her mother had guillainbarre from flu shot  Left middle lobe pneumonia: Recently discharged from the hospital, she treated for H CAP. Started IV vancomycin and cefepime on 10/18/2018. She has defervesced. mrsa screening negative, d/c vanc, if continue improving, change to augmentin  Hypophosphotemia: Replace phos  Hypomagnesemia: Replace mag  noninsulin dependent dm2,  Home meds metformin held Blood glucose elevated while on iv steroids, On ssi here She refused carb modified diet  Anemia: normocytic Per chart review, has been having anemia since 2014 -no overt bleeding in the hospital -f/u with pcp for anemia work up.  Essential hypertension Pressure currently stable continue current management.  Morbid obesity ([place]): Body mass index is 42.45 kg/m.  Chronic pain: Continue home regimen, IV narcotics is not indicated in chronic pain management Continue home meds zofran prn for nausea, avoid sedative meds ( benzo, phenergan, ect ) in the setting of respiratory failure Continue bowel  regimen   Code Status: full  Family Communication: patient   Disposition Plan: 1-2 days , pending ambulatory pulse o2 Husband is sick with the flu at home  Consultants:  none  Procedures:  none  Antibiotics:  Vanc/cefepime    Objective: BP 140/71 (BP Location: Right Wrist)   Pulse 82   Temp (!) 97.4 F (36.3 C) (Oral)   Resp 20   Ht 5' 2"  (1.575 m)   Wt 105.3 kg   SpO2 97%   BMI 42.45 kg/m   Intake/Output Summary (Last 24 hours) at 10/20/2018 1020 Last data filed at 10/20/2018 0930 Gross per 24 hour  Intake 2416.04 ml  Output 2150 ml  Net 266.04 ml   Filed Weights   10/18/18 1438 10/19/18 0516 10/19/18 1951  Weight: 121.2 kg 104.3 kg 105.3 kg    Exam: Patient is examined daily including today on 10/20/2018, exams remain the same as of yesterday except that has changed    General:  NAD  Cardiovascular: RRR  Respiratory: mild wheezing bilaterally  Abdomen: Soft/ND/NT, positive BS  Musculoskeletal: No Edema  Neuro: alert, oriented   Data Reviewed: Basic Metabolic Panel: Recent Labs  Lab 10/15/18 0300 10/18/18 0645 10/19/18 0702 10/20/18 0518  NA 139 134* 137  --   K 3.9 3.7 3.9  --   CL 99 94* 98  --   CO2 31 27 28   --   GLUCOSE 106* 186* 175*  --   BUN 6 11 9   --   CREATININE 0.79 0.89 0.77  --   CALCIUM 8.7* 8.0* 8.2*  --   MG  --  1.4*  --  1.6*  PHOS  --  1.8*  --  2.0*  Liver Function Tests: Recent Labs  Lab 10/18/18 0645  AST 62*  ALT 19  ALKPHOS 59  BILITOT 0.8  PROT 7.1  ALBUMIN 2.9*   No results for input(s): LIPASE, AMYLASE in the last 168 hours. No results for input(s): AMMONIA in the last 168 hours. CBC: Recent Labs  Lab 10/15/18 0300 10/18/18 0645 10/19/18 0702  WBC 8.2 6.7 6.9  NEUTROABS  --  4.6  --   HGB 10.0* 9.4* 9.2*  HCT 35.5* 32.6* 30.6*  MCV 81.2 80.3 79.5*  PLT 221 157 159   Cardiac Enzymes:   No results for input(s): CKTOTAL, CKMB, CKMBINDEX, TROPONINI in the last 168 hours. BNP (last 3  results) Recent Labs    10/18/18 0645  BNP 110.1*    ProBNP (last 3 results) No results for input(s): PROBNP in the last 8760 hours.  CBG: Recent Labs  Lab 10/19/18 0649 10/19/18 1126 10/19/18 1629 10/19/18 2231 10/20/18 0732  GLUCAP 155* 269* 211* 236* 120*    Recent Results (from the past 240 hour(s))  Blood culture (routine x 2)     Status: None (Preliminary result)   Collection Time: 10/18/18  6:50 AM  Result Value Ref Range Status   Specimen Description BLOOD RIGHT HAND  Final   Special Requests   Final    BOTTLES DRAWN AEROBIC AND ANAEROBIC Blood Culture adequate volume   Culture   Final    NO GROWTH 1 DAY Performed at [facility], [place] [address], [place], [place] [address]    Report Status PENDING  Incomplete  Blood culture (routine x 2)     Status: None (Preliminary result)   Collection Time: 10/18/18  7:54 AM  Result Value Ref Range Status   Specimen Description BLOOD SITE NOT SPECIFIED  Final   Special Requests   Final    BOTTLES DRAWN AEROBIC AND ANAEROBIC Blood Culture adequate volume   Culture   Final    NO GROWTH 1 DAY Performed at [facility], [place] [address] [place], [place] [address]    Report Status PENDING  Incomplete  Urine culture     Status: Abnormal   Collection Time: 10/18/18  9:16 AM  Result Value Ref Range Status   Specimen Description URINE, CATHETERIZED  Final   Special Requests   Final    NONE Performed at [facility], [place] [address], [place], [place] [address]    Culture MULTIPLE SPECIES PRESENT, SUGGEST RECOLLECTION (A)  Final   Report Status 10/19/2018 FINAL  Final     Studies: No results found.  Scheduled Meds: . ALPRAZolam  1 mg Oral Daily   And  . ALPRAZolam  2 mg Oral QHS  . aspirin EC  81 mg Oral Daily  . benazepril  40 mg Oral Daily  . budesonide (PULMICORT) nebulizer solution  0.25 mg Nebulization BID  . enoxaparin (LOVENOX) injection  40 mg Subcutaneous Q24H  . escitalopram  20 mg Oral QHS    . estradiol  2 mg Oral Daily  . guaiFENesin  600 mg Oral BID  . insulin aspart  0-5 Units Subcutaneous QHS  . insulin aspart  0-9 Units Subcutaneous TID WC  . lactulose  10 g Oral BID  . loratadine  10 mg Oral Daily  . magnesium oxide  400 mg Oral BID  . metFORMIN  500 mg Oral BID WC  . methylPREDNISolone (SOLU-MEDROL) injection  40 mg Intravenous Q12H  . montelukast  10 mg Oral QHS  .  oseltamivir  75 mg Oral BID  . oxyCODONE  10 mg Oral Q4H  . pantoprazole  40 mg Oral BID  . phosphorus  500 mg Oral BID  . simvastatin  20 mg Oral QHS    Continuous Infusions: . sodium chloride Stopped (10/20/18 0011)  . ceFEPime (MAXIPIME) IV Stopped (10/20/18 0012)  . vancomycin 1,250 mg (10/20/18 0824)     Time spent: 3mns I have personally reviewed and interpreted on  10/20/2018 daily labs, imagings as discussed above under date review session and assessment and plans.  I reviewed all nursing notes, pharmacy notes, vitals, pertinent old records  I have discussed plan of care as described above with RN , patient  on 10/20/2018   FFlorencia ReasonsMD, PhD  [facility] Pager [phone] If 7PM-7AM, please contact night-coverage at [email or web address], password [email or web address]2/01/2019, 10:20 AM  LOS: 2 days

## 2018-10-21 LAB — CBC WITH DIFFERENTIAL/PLATELET
Abs Immature Granulocytes: 0 10*3/uL (ref 0.00–0.07)
Basophils Absolute: 0 10*3/uL (ref 0.0–0.1)
Basophils Relative: 0 %
Eosinophils Absolute: 0 10*3/uL (ref 0.0–0.5)
Eosinophils Relative: 0 %
HCT: 32.2 % — ABNORMAL LOW (ref 36.0–46.0)
Hemoglobin: 9.4 g/dL — ABNORMAL LOW (ref 12.0–15.0)
Lymphocytes Relative: 11 %
Lymphs Abs: 0.8 10*3/uL (ref 0.7–4.0)
MCH: 23.2 pg — ABNORMAL LOW (ref 26.0–34.0)
MCHC: 29.2 g/dL — ABNORMAL LOW (ref 30.0–36.0)
MCV: 79.3 fL — ABNORMAL LOW (ref 80.0–100.0)
MONO ABS: 0.2 10*3/uL (ref 0.1–1.0)
Monocytes Relative: 3 %
Neutro Abs: 6.4 10*3/uL (ref 1.7–7.7)
Neutrophils Relative %: 86 %
Platelets: 163 10*3/uL (ref 150–400)
RBC: 4.06 MIL/uL (ref 3.87–5.11)
RDW: 16.7 % — ABNORMAL HIGH (ref 11.5–15.5)
WBC: 7.4 10*3/uL (ref 4.0–10.5)
nRBC: 0 % (ref 0.0–0.2)
nRBC: 0 /100 WBC

## 2018-10-21 LAB — COMPREHENSIVE METABOLIC PANEL
ALT: 25 U/L (ref 0–44)
AST: 33 U/L (ref 15–41)
Albumin: 2.8 g/dL — ABNORMAL LOW (ref 3.5–5.0)
Alkaline Phosphatase: 89 U/L (ref 38–126)
Anion gap: 12 (ref 5–15)
BILIRUBIN TOTAL: 0.6 mg/dL (ref 0.3–1.2)
BUN: 8 mg/dL (ref 6–20)
CALCIUM: 8.4 mg/dL — AB (ref 8.9–10.3)
CO2: 30 mmol/L (ref 22–32)
CREATININE: 0.63 mg/dL (ref 0.44–1.00)
Chloride: 96 mmol/L — ABNORMAL LOW (ref 98–111)
GFR calc Af Amer: >60 mL/min (ref >60)
GFR calc non Af Amer: >60 mL/min (ref >60)
Glucose, Bld: 184 mg/dL — ABNORMAL HIGH (ref 70–99)
Potassium: 3.6 mmol/L (ref 3.5–5.1)
Sodium: 138 mmol/L (ref 135–145)
Total Protein: 7.3 g/dL (ref 6.5–8.1)

## 2018-10-21 LAB — GLUCOSE, CAPILLARY
Glucose-Capillary: 215 mg/dL — ABNORMAL HIGH (ref 70–99)
Glucose-Capillary: 344 mg/dL — ABNORMAL HIGH (ref 70–99)
Glucose-Capillary: 370 mg/dL — ABNORMAL HIGH (ref 70–99)
Glucose-Capillary: 259 mg/dL — ABNORMAL HIGH (ref 70–99)

## 2018-10-21 LAB — MAGNESIUM: Magnesium: 1.9 mg/dL (ref 1.7–2.4)

## 2018-10-21 MED ORDER — AMOXICILLIN-POT CLAVULANATE 875-125 MG PO TABS
1.0000 | ORAL_TABLET | Freq: Two times a day (BID) | ORAL | Status: DC
  Administered 2018-10-21 – 2018-10-22 (×3): 1 via ORAL
  Filled 2018-10-21 (×3): qty 1

## 2018-10-21 MED ORDER — PREDNISONE 20 MG PO TABS
30.0000 mg | ORAL_TABLET | Freq: Every day | ORAL | Status: DC
  Administered 2018-10-22: 30 mg via ORAL
  Filled 2018-10-21: qty 1

## 2018-10-21 NOTE — Progress Notes (Signed)
Inpatient Diabetes Program Recommendations  AACE/ADA: New Consensus Statement on Inpatient Glycemic Control (2015)  Target Ranges:  Prepandial:   less than 140 mg/dL      Peak postprandial:   less than 180 mg/dL (1-2 hours)      Critically ill patients:  140 - 180 mg/dL   Lab Results  Component Value Date   GLUCAP 344 (H) 10/21/2018   HGBA1C 5.4 05/15/2017    Review of Glycemic Control  Diabetes history: DM2 Outpatient Diabetes medications: metformin 1000 mg bid Current orders for Inpatient glycemic control: Novolog 0-9 units tidwc  Inpatient Diabetes Program Recommendations:     Need updated HgbA1C to assess glycemic control PTA. Increase Novolog to 0-15 units tidwc and hs  Will continue to follow.  Thank you. Lorenda Peck, RD, LDN, CDE Inpatient Diabetes Coordinator [phone]

## 2018-10-21 NOTE — Progress Notes (Signed)
PROGRESS NOTE  [name] WGY:[id number] DOB: 08/21/1961 DOA: 10/18/2018 PCP: Shirline Frees, MD  HPI/Recap of past 24 hours:  fever resolved Less cough, no room air at rest, still feel significant dyspnea while walking in the hallway, but no hypoxia    Assessment/Plan: Active Problems:   Hypertension   Morbid obesity ([place])   Cirrhosis of liver: per CT   COPD exacerbation ([name])   Influenza A   Acute on chronic respiratory failure with hypoxia ([place])   Healthcare-associated pneumonia   Lobar pneumonia ([place])   Hypomagnesemia   Hypophosphatemia   Acute on chronic respiratory failure with hypoxia due to COPD exacerbation the setting influenza A infection possibly middle lobe pneumonia: -she was discharged home a week ago on o2 supplement for COPD -She  Returned to the hospital due to significant dyspnea/wheezing/fever, and was admitted to the hospital started on IV Solu-Medrol, inhalers and empiric antibiotics., she is found + for flu A -She was also started on Tamiflu. -She has defervesced continue conservative management. -Wean oxygen, ambulate, taper steroids  She declined flu shot due to her mother had guillainbarre from flu shot  Left middle lobe pneumonia: Recently discharged from the hospital, she treated for H CAP. Started IV vancomycin and cefepime on 10/18/2018. She has defervesced. mrsa screening negative, d/c vanc,  She continue to improve, d/c cefepime, change to augmentin ( she reports n/v from amoxicillin in the past, but she tolerated augmentin in the past without issue)  Hypophosphotemia: Replace phos, improved  Hypomagnesemia: Replace mag, improved   noninsulin dependent dm2,  Home meds metformin held Blood glucose elevated while on iv steroids, On ssi here She refused carb modified diet  Anemia: normocytic Per chart review, has been having anemia since 2014 -no overt bleeding in the hospital -f/u with pcp for anemia work up.  Essential  hypertension Pressure currently stable continue current management.  Morbid obesity ([place]): Body mass index is 42.46 kg/m.  Chronic pain: Continue home regimen, IV narcotics is not indicated in chronic pain management Continue home meds zofran prn for nausea, avoid sedative meds ( benzo, phenergan, ect ) in the setting of respiratory failure Continue bowel regimen   Code Status: full  Family Communication: patient   Disposition Plan: home likely tomorrow Husband is sick with the flu at home  Consultants:  none  Procedures:  none  Antibiotics:  Vanc from admission to 2/5  cefepime  From admission to 2/6  augmentin from 2/6   Objective: BP (!) 142/79 (BP Location: Left Arm)   Pulse 81   Temp (!) 97.5 F (36.4 C) (Oral)   Resp 20   Ht 5' 2"  (1.575 m)   Wt 105.3 kg   SpO2 97%   BMI 42.46 kg/m   Intake/Output Summary (Last 24 hours) at 10/21/2018 1122 Last data filed at 10/21/2018 7793 Gross per 24 hour  Intake 1540 ml  Output 6350 ml  Net -4810 ml   Filed Weights   10/19/18 0516 10/19/18 1951 10/20/18 2036  Weight: 104.3 kg 105.3 kg 105.3 kg    Exam: Patient is examined daily including today on 10/21/2018, exams remain the same as of yesterday except that has changed    General:  NAD  Cardiovascular: RRR  Respiratory: mild wheezing bilaterally heard on 2/5 has resolved this am  Abdomen: Soft/ND/NT, positive BS  Musculoskeletal: No Edema  Neuro: alert, oriented   Data Reviewed: Basic Metabolic Panel: Recent Labs  Lab 10/15/18 0300 10/18/18 0645 10/19/18 0702 10/20/18 0518 10/21/18  0509  NA 139 134* 137  --  138  K 3.9 3.7 3.9  --  3.6  CL 99 94* 98  --  96*  CO2 31 27 28   --  30  GLUCOSE 106* 186* 175*  --  184*  BUN 6 11 9   --  8  CREATININE 0.79 0.89 0.77  --  0.63  CALCIUM 8.7* 8.0* 8.2*  --  8.4*  MG  --  1.4*  --  1.6* 1.9  PHOS  --  1.8*  --  2.0*  --    Liver Function Tests: Recent Labs  Lab 10/18/18 0645 10/21/18 0509    AST 62* 33  ALT 19 25  ALKPHOS 59 89  BILITOT 0.8 0.6  PROT 7.1 7.3  ALBUMIN 2.9* 2.8*   No results for input(s): LIPASE, AMYLASE in the last 168 hours. No results for input(s): AMMONIA in the last 168 hours. CBC: Recent Labs  Lab 10/15/18 0300 10/18/18 0645 10/19/18 0702 10/21/18 0509  WBC 8.2 6.7 6.9 7.4  NEUTROABS  --  4.6  --  6.4  HGB 10.0* 9.4* 9.2* 9.4*  HCT 35.5* 32.6* 30.6* 32.2*  MCV 81.2 80.3 79.5* 79.3*  PLT 221 157 159 163   Cardiac Enzymes:   No results for input(s): CKTOTAL, CKMB, CKMBINDEX, TROPONINI in the last 168 hours. BNP (last 3 results) Recent Labs    10/18/18 0645  BNP 110.1*    ProBNP (last 3 results) No results for input(s): PROBNP in the last 8760 hours.  CBG: Recent Labs  Lab 10/20/18 0732 10/20/18 1112 10/20/18 1653 10/20/18 2035 10/21/18 0729  GLUCAP 120* 268* 243* 278* 215*    Recent Results (from the past 240 hour(s))  Blood culture (routine x 2)     Status: None (Preliminary result)   Collection Time: 10/18/18  6:50 AM  Result Value Ref Range Status   Specimen Description BLOOD RIGHT HAND  Final   Special Requests   Final    BOTTLES DRAWN AEROBIC AND ANAEROBIC Blood Culture adequate volume   Culture   Final    NO GROWTH 3 DAYS Performed at [facility], [place] [address], [place], [place] [address]    Report Status PENDING  Incomplete  Blood culture (routine x 2)     Status: None (Preliminary result)   Collection Time: 10/18/18  7:54 AM  Result Value Ref Range Status   Specimen Description BLOOD SITE NOT SPECIFIED  Final   Special Requests   Final    BOTTLES DRAWN AEROBIC AND ANAEROBIC Blood Culture adequate volume   Culture   Final    NO GROWTH 3 DAYS Performed at [facility], [address], [place], [place] [address]    Report Status PENDING  Incomplete  Urine culture     Status: Abnormal   Collection Time: 10/18/18  9:16 AM  Result Value Ref Range Status   Specimen Description URINE, CATHETERIZED   Final   Special Requests   Final    NONE Performed at [facility], [place] [address], [place], [place] [address]    Culture MULTIPLE SPECIES PRESENT, SUGGEST RECOLLECTION (A)  Final   Report Status 10/19/2018 FINAL  Final  MRSA PCR Screening     Status: None   Collection Time: 10/20/18 12:47 PM  Result Value Ref Range Status   MRSA by PCR NEGATIVE NEGATIVE Final    Comment:        The GeneXpert MRSA Assay (FDA approved  for NASAL specimens only), is one component of a comprehensive MRSA colonization surveillance program. It is not intended to diagnose MRSA infection nor to guide or monitor treatment for MRSA infections. Performed at [facility], [place] [address] [place], [place] [address]      Studies: No results found.  Scheduled Meds: . ALPRAZolam  1 mg Oral Daily   And  . ALPRAZolam  2 mg Oral QHS  . aspirin EC  81 mg Oral Daily  . benazepril  40 mg Oral Daily  . budesonide (PULMICORT) nebulizer solution  0.25 mg Nebulization BID  . enoxaparin (LOVENOX) injection  40 mg Subcutaneous Q24H  . escitalopram  20 mg Oral QHS  . estradiol  2 mg Oral Daily  . guaiFENesin  600 mg Oral BID  . insulin aspart  0-5 Units Subcutaneous QHS  . insulin aspart  0-9 Units Subcutaneous TID WC  . lactulose  10 g Oral BID  . loratadine  10 mg Oral Daily  . montelukast  10 mg Oral QHS  . oseltamivir  75 mg Oral BID  . oxyCODONE  10 mg Oral Q4H  . pantoprazole  40 mg Oral BID  . polyethylene glycol  17 g Oral BID  . [START ON 10/22/2018] predniSONE  30 mg Oral Q breakfast  . simvastatin  20 mg Oral QHS    Continuous Infusions: . sodium chloride Stopped (10/20/18 0011)     Time spent: 91mns I have personally reviewed and interpreted on  10/21/2018 daily labs, imagings as discussed above under date review session and assessment and plans.  I reviewed all nursing notes, pharmacy notes, vitals, pertinent old records  I have discussed plan of care as described above with RN  , patient  on 10/21/2018   FFlorencia ReasonsMD, PhD  [facility] Pager [phone] If 7PM-7AM, please contact night-coverage at [email or web address], password [email or web address]2/02/2019, 11:22 AM  LOS: 3 days

## 2018-10-21 NOTE — Progress Notes (Signed)
SATURATION QUALIFICATIONS: (This note is used to comply with regulatory documentation for home oxygen)  Patient Saturations on Room Air at Rest = 97%  Patient Saturations on Room Air while Ambulating = 95%  Patient Saturations on 0 Liters of oxygen while Ambulating = 95%  Please briefly explain why patient needs home oxygen: N/A

## 2018-10-22 ENCOUNTER — Inpatient Hospital Stay (HOSPITAL_COMMUNITY): Payer: Medicare Other

## 2018-10-22 LAB — GLUCOSE, CAPILLARY
Glucose-Capillary: 122 mg/dL — ABNORMAL HIGH (ref 70–99)
Glucose-Capillary: 99 mg/dL (ref 70–99)

## 2018-10-22 MED ORDER — PREDNISONE 10 MG PO TABS: ORAL_TABLET | ORAL | 0 refills | Status: DC

## 2018-10-22 MED ORDER — OSELTAMIVIR PHOSPHATE 75 MG PO CAPS: 75.0000 mg | ORAL_CAPSULE | Freq: Two times a day (BID) | ORAL | 0 refills | Status: AC

## 2018-10-22 MED ORDER — AMOXICILLIN-POT CLAVULANATE 875-125 MG PO TABS: 1.0000 | ORAL_TABLET | Freq: Two times a day (BID) | ORAL | 0 refills | Status: AC

## 2018-10-22 MED ORDER — MAGNESIUM GLUCONATE 30 MG PO TABS: 30.0000 mg | ORAL_TABLET | Freq: Every day | ORAL | 0 refills | Status: DC

## 2018-10-22 MED ORDER — GUAIFENESIN ER 600 MG PO TB12: 600.0000 mg | ORAL_TABLET | Freq: Two times a day (BID) | ORAL | 0 refills | Status: AC

## 2018-10-22 MED ORDER — POLYETHYLENE GLYCOL 3350 17 G PO PACK: 17.0000 g | PACK | Freq: Two times a day (BID) | ORAL | 0 refills | Status: DC

## 2018-10-22 MED ORDER — SENNOSIDES-DOCUSATE SODIUM 8.6-50 MG PO TABS: 1.0000 | ORAL_TABLET | Freq: Every day | ORAL | 0 refills | Status: DC

## 2018-10-22 NOTE — Progress Notes (Signed)
PT Cancellation Note  Patient Details Name: [name] MRN: [id number] DOB: [birth year]   Cancelled Treatment:    Reason Eval/Treat Not Completed: Patient declined, no reason specified patient politely declines PT due to ongoing high levels of pain and not sleeping well last night. Offered modified session and bed exercise but she continued to refuse, unable to convince patient to participate in therapy today.    Deniece Ree PT, DPT, CBIS  Supplemental Physical Therapist [facility]    Pager [phone] Acute Rehab Office [phone]

## 2018-10-22 NOTE — Discharge Summary (Signed)
Discharge Summary  [name] EML:[id number] DOB: 1961/06/01  PCP: [name] Frees, Wiggins  Admit date: 10/18/2018 Discharge date: 10/22/2018  Time spent: 79mns, more than 50% time spent on coordination of care.  Recommendations for Outpatient Follow-up:  1. F/u with PCP within a week  for hospital discharge follow up, repeat cbc/bmp at follow up. pcp to follow up on anemia, pcp to repeat cxr in 3-4 weeks. 2. F/u with GI DR [name] Lamas3. F/u with pain clinic  4. Patient declined home health arrangement  Discharge Diagnoses:  Active Hospital Problems   Diagnosis Date Noted  . Lobar pneumonia ([name]   . Hypomagnesemia   . Hypophosphatemia   . Healthcare-associated pneumonia   . Acute on chronic respiratory failure with hypoxia ([name] 10/18/2018  . COPD exacerbation ([name] 10/12/2016  . Influenza A 10/12/2016  . Cirrhosis of liver: per CT 07/11/2014  . Morbid obesity ([facility] 03/09/2013  . Hypertension     Resolved Hospital Problems  No resolved problems to display.    Discharge Condition: stable  Diet recommendation: heart healthy/carb modified  Filed Weights   10/19/18 1951 10/20/18 2036 10/21/18 2214  Weight: 105.3 kg 105.3 kg 108 kg    History of present illness: (per admitting Wiggins Dr [name] PCP: HShirline Frees Wiggins  Outpatient Specialists:    Patient coming from: Home  Chief Complaint: Fever, shortness of breath and wheezing.  HPI:  Patient is a 58year old Caucasian female, morbidly obese, with past medical history significant for COPD, asthma, OSA, tobacco abuse, perforated bowel, ileus, hypertension, hyperlipidemia, liver cirrhosis, diabetes mellitus, coronary artery disease and chronic back pain.  Apparently, patient was discharged from this hospital 3 days ago after brief treatment for COPD with exacerbation.  Patient was discharged home on 3 L of oxygen.  Patient reports that she has experienced progressive worsening shortness of breath and wheezing since discharge.   Patient developed fever yesterday, with associated nonproductive cough.  On presentation to the hospital, patient was in severe respiratory distress, requiring 6 L of supplemental oxygen each minute, and was found to have a temperature of 103.1 Fahrenheit, with leukocytosis.  Chest x-ray is said to reveal left midlung consolidation.  Influenza A came back positive by PCR.  Patient has chronic headache.  No neck pain, no chest pain, no GI symptoms and no urinary symptoms.  Patient be admitted for further assessment and management.  ED Course: Patient has been pancultured and started on antibiotics.  Patient was also treated for worsening shortness of breath and wheezing with nebulizer treatment. Pertinent labs: As documented above. EKG: Independently reviewed.   Hospital Course:  Active Problems:   Hypertension   Morbid [place] ([name]   Cirrhosis of liver: per CT   COPD exacerbation ([name])   Influenza A   Acute on chronic respiratory failure with hypoxia ([name])   Healthcare-associated pneumonia   Lobar pneumonia ([name])   Hypomagnesemia   Hypophosphatemia   Acute on chronic respiratory failure with hypoxia due to COPD exacerbation the setting influenza A infection possibly middle lobe pneumonia: -she was discharged home a week ago on o2 supplement for COPD -She  Returned to the hospital due to significant dyspnea/wheezing/fever, and was admitted to the hospital found to have influenza A,  She is started on IV Solu-Medrol, inhalers and empiric antibiotics -fever resolved, wheezing resolved, she ambulate on room air, no hypoxia -She is discharged on tamiflu for one more day to finish treatment  She declined flu shot due to her mother had guillainbarre from flu  shot.  Left middle lobe pneumonia: Recently discharged from the hospital, she treated for H CAP. Started IV vancomycin and cefepime on 10/18/2018. She has defervesced. mrsa screening negative, d/c vanc,  She continue to improve, d/c  cefepime, change to augmentin ( she reports n/v from amoxicillin in the past, but she tolerated augmentin in the past without issue)  She is discharged on augmentin for two more days to finish treatment, pcp to repeat cxr in 3-4 weeks  Hypophosphotemia: Replace phos, normalized  Hypomagnesemia: Replace mag, normalized, she is discharged on oral mag supplement   noninsulin dependent dm2,  Home meds metformin held Blood glucose elevated while on iv steroids, On ssi here She refused carb modified diet Metformin resumed at discharge  Anemia: normocytic Per chart review, has been having anemia since 2014 -no overt bleeding in the hospital -f/u with pcp for anemia work up.  Essentialhypertension Pressure currently stable continue current management.  Morbid obesity ([name]): Body mass index is 42.46 kg/m. Encourage weight loss  Chronic abdominal pain: Continue home regimen, IV narcotics is not indicated in chronic pain management Continue home meds zofran prn for nausea, avoid sedative meds ( benzo, phenergan, ect ) in the setting of respiratory failure Continue bowel regimen kub unremarkable F/u with pain clinic , f/u with GI  H/o dysphagia, esophageal stricture s/p dilation in 10/2016, f/u with GI Dr Oletta Wiggins.   Code Status: full  Family Communication: patient   Disposition Plan: home, patient declined home health   Consultants:  none  Procedures:  none  Antibiotics:  Vanc from admission to 2/5  cefepime  From admission to 2/6  augmentin from 2/6  Discharge Exam: BP (!) 153/86 (BP Location: Right Arm)   Pulse 90   Temp 98.5 F (36.9 C) (Oral)   Resp (!) 21   Ht 5' 2"  (1.575 m)   Wt 108 kg   SpO2 95%   BMI 43.55 kg/m   General: NAD Cardiovascular: RRR Respiratory: CTABL Abdomen: Soft/ND/NT, positive BS Musculoskeletal: No Edema Neuro: alert, oriented   Discharge Instructions You were cared for by a hospitalist during your hospital  stay. If you have any questions about your discharge medications or the care you received while you were in the hospital after you are discharged, you can call the unit and asked to speak with the hospitalist on call if the hospitalist that took care of you is not available. Once you are discharged, your primary care physician will handle any further medical issues. Please note that NO REFILLS for any discharge medications will be authorized once you are discharged, as it is imperative that you return to your primary care physician (or establish a relationship with a primary care physician if you do not have one) for your aftercare needs so that they can reassess your need for medications and monitor your lab values.  Discharge Instructions    Diet - low sodium heart healthy   Complete by:  As directed    Carb modified   Increase activity slowly   Complete by:  As directed      Allergies as of 10/22/2018      Reactions   Amoxicillin Nausea And Vomiting   tolerated augmentin in the past (03/2013) without issue.   Clindamycin/lincomycin Nausea And Vomiting   Doxycycline Nausea And Vomiting   Treximet [sumatriptan-naproxen Sodium] Nausea And Vomiting   Bactrim Hives, Rash      Medication List    STOP taking these medications   lactulose 10 GM/15ML solution  Commonly known as:  CHRONULAC     TAKE these medications   acetaminophen 325 MG tablet Commonly known as:  TYLENOL Take 2 tablets (650 mg total) by mouth every 6 (six) hours as needed for headache.   albuterol 108 (90 Base) MCG/ACT inhaler Commonly known as:  PROVENTIL HFA;VENTOLIN HFA Inhale 2 puffs into the lungs every 4 (four) hours as needed for wheezing or shortness of breath.   ALPRAZolam 1 MG tablet Commonly known as:  XANAX Take 1-2 mg by mouth See admin instructions. Take 1 mg in the morning and 2 mg at bedtime.   amLODipine 5 MG tablet Commonly known as:  NORVASC Take 5 mg by mouth daily.   amoxicillin-clavulanate  875-125 MG tablet Commonly known as:  AUGMENTIN Take 1 tablet by mouth every 12 (twelve) hours for 2 days.   aspirin EC 81 MG tablet Take 1 tablet (81 mg total) by mouth daily.   benazepril 40 MG tablet Commonly known as:  LOTENSIN Take 40 mg by mouth daily.   budesonide-formoterol 160-4.5 MCG/ACT inhaler Commonly known as:  SYMBICORT Inhale 2 puffs into the lungs 2 (two) times daily.   cyclobenzaprine 10 MG tablet Commonly known as:  FLEXERIL Take 10 mg by mouth 3 (three) times daily as needed for muscle spasms.   escitalopram 20 MG tablet Commonly known as:  LEXAPRO Take 20 mg by mouth at bedtime.   estradiol 2 MG tablet Commonly known as:  ESTRACE Take 2 mg by mouth daily.   guaiFENesin 600 MG 12 hr tablet Commonly known as:  MUCINEX Take 1 tablet (600 mg total) by mouth 2 (two) times daily for 30 days.   ipratropium-albuterol 0.5-2.5 (3) MG/3ML Soln Commonly known as:  DUONEB Take 3 mLs by nebulization every 6 (six) hours as needed (Shortness of breath).   levocetirizine 5 MG tablet Commonly known as:  XYZAL Take 5 mg by mouth daily.   magnesium gluconate 30 MG tablet Commonly known as:  MAGONATE Take 1 tablet (30 mg total) by mouth daily.   metFORMIN 500 MG tablet Commonly known as:  GLUCOPHAGE Take 2 tablets (1,000 mg total) by mouth 2 (two) times daily with a meal. What changed:  how much to take   ondansetron 8 MG tablet Commonly known as:  ZOFRAN Take 8 mg by mouth every 8 (eight) hours as needed for nausea or vomiting.   oseltamivir 75 MG capsule Commonly known as:  TAMIFLU Take 1 capsule (75 mg total) by mouth 2 (two) times daily for 1 day.   Oxycodone HCl 10 MG Tabs Take 10 mg by mouth every 4 (four) hours.   pantoprazole 40 MG tablet Commonly known as:  PROTONIX Take 40 mg by mouth 2 (two) times daily.   polyethylene glycol packet Commonly known as:  MIRALAX / GLYCOLAX Take 17 g by mouth 2 (two) times daily.   predniSONE 10 MG  tablet Commonly known as:  DELTASONE Take 2tabs on 2/8 , then take one tab on 2/9. Then stop. What changed:    medication strength  how much to take  how to take this  when to take this  additional instructions   senna-docusate 8.6-50 MG tablet Commonly known as:  SENOKOT S Take 1 tablet by mouth at bedtime.   simvastatin 20 MG tablet Commonly known as:  ZOCOR Take 20 mg by mouth at bedtime.      Allergies  Allergen Reactions  . Amoxicillin Nausea And Vomiting    tolerated augmentin in the  past (03/2013) without issue.  . Clindamycin/Lincomycin Nausea And Vomiting  . Doxycycline Nausea And Vomiting  . Treximet [Sumatriptan-Naproxen Sodium] Nausea And Vomiting  . Bactrim Hives and Rash   Follow-up Information    [name] Frees, Wiggins Follow up in 1 week(s).   Specialty:  Family Medicine Why:  hospital discharge follow up, repeat cbc/bmp at follow up. pcp to arrange repeat cxr 2view in 3-4 weeks to ensure resolution of pneumonia. Contact information: [place] [address] [phone]        Laurence Spates, Wiggins Follow up in 1 week(s).   Specialty:  Gastroenterology Contact information: [address] [place] [address] [phone]            The results of significant diagnostics from this hospitalization (including imaging, microbiology, ancillary and laboratory) are listed below for reference.    Significant Diagnostic Studies: Dg Chest 2 View  Result Date: 10/22/2018 CLINICAL DATA:  Abdominal pain with nausea. Cough and shortness of breath for a week. History of flu. EXAM: CHEST - 2 VIEW COMPARISON:  Radiographs 10/18/2018 and 10/15/2018. FINDINGS: Lordotic positioning. The heart size and mediastinal contours are stable. There is mild central airway thickening and patchy atelectasis in both lungs. No focal airspace disease, edema, pleural effusion or pneumothorax. No acute osseous findings are evident. There  are degenerative changes throughout the spine. IMPRESSION: Mild central airway thickening and patchy atelectasis in both lungs. No focal airspace disease. Electronically Signed   By: Richardean Sale M.D.   On: 10/22/2018 10:34   Dg Chest 2 View  Result Date: 10/15/2018 CLINICAL DATA:  Hypertension, headache, and low oxygen saturation tonight. EXAM: CHEST - 2 VIEW COMPARISON:  10/13/2017 FINDINGS: Normal heart size and pulmonary vascularity. No focal airspace disease or consolidation in the lungs. No blunting of costophrenic angles. No pneumothorax. Mediastinal contours appear intact. Degenerative changes in the spine. IMPRESSION: No active cardiopulmonary disease. Electronically Signed   By: Lucienne Capers M.D.   On: 10/15/2018 03:52   Dg Abd 1 View  Result Date: 10/22/2018 CLINICAL DATA:  Abdominal pain with nausea. Cough and shortness of breath for a week. History of flu. EXAM: ABDOMEN - 1 VIEW COMPARISON:  Abdominal CT 10/18/2018.  Radiographs 10/13/2017. FINDINGS: The bowel gas pattern is normal. There is no supine evidence of free intraperitoneal air. Moderate stool is present throughout the colon. Bilateral pelvic calcifications are grossly stable. No acute osseous findings. IMPRESSION: No evidence of active abdominal process. Electronically Signed   By: Richardean Sale M.D.   On: 10/22/2018 10:33   Ct Abdomen Pelvis W Contrast  Result Date: 10/18/2018 CLINICAL DATA:  Diffuse lower abdominal pain since a fall 10 days ago. EXAM: CT ABDOMEN AND PELVIS WITH CONTRAST TECHNIQUE: Multidetector CT imaging of the abdomen and pelvis was performed using the standard protocol following bolus administration of intravenous contrast. CONTRAST:  100 cc OMNIPAQUE IOHEXOL 300 MG/ML  SOLN COMPARISON:  12/09/2014 FINDINGS: Lower chest: Bibasilar airspace opacity, left greater than right, atelectasis versus infiltrates. No pleural effusion. The heart is normal in size. No pericardial effusion. Small cluster of lymph  nodes are noted in the epicardial region typical with cirrhosis. Hepatobiliary: Cirrhotic changes involving the liver appear relatively stable. No worrisome hepatic lesions or intrahepatic biliary dilatation. The gallbladder is slightly distended and surrounded by fluid. No common bile duct dilatation. Pancreas: No mass, inflammation or ductal dilatation. Spleen: The spleen is upper limits of normal in size. Stable splenic cyst. Adrenals/Urinary Tract:  Adrenal glands and kidneys are unremarkable. The bladder is normal. Stomach/Bowel: The stomach, duodenum, small bowel and colon are unremarkable. No acute inflammatory changes, mass lesions or obstructive findings. Moderate stool throughout the colon could suggest constipation. The terminal ileum appears normal. The appendix is not identified for certain. Possible prior appendectomy. Vascular/Lymphatic: Borderline enlarged gastrohepatic ligament, celiac axis and periportal lymph nodes, typical for cirrhosis. The aorta and branch vessels are patent. The major venous structures are patent. There are changes of portal venous hypertension with portal venous collaterals. No esophageal varices. Reproductive: Surgically absent. Other: Moderate volume abdominal/pelvic ascites. Musculoskeletal: No significant bony findings. Bilateral pars defects are noted at L5 with a grade 1 spondylolisthesis and associated degenerative disc disease at L5-S1. IMPRESSION: 1. No acute abdominal/pelvic findings, mass lesions or lymphadenopathy. 2. Stable cirrhotic changes involving the liver with portal venous hypertension and portal venous collaterals and moderate abdominal/pelvic ascites. 3. Bibasilar atelectasis versus infiltrates. 4. Epicardial, gastrohepatic ligament, celiac axis and periportal lymph nodes typical for cirrhosis. Electronically Signed   By: Marijo Sanes M.D.   On: 10/18/2018 08:05   Dg Chest Portable 1 View  Result Date: 10/18/2018 CLINICAL DATA:  Cough and shortness of  breath EXAM: PORTABLE CHEST 1 VIEW COMPARISON:  October 15, 2018 FINDINGS: There is focal airspace consolidation in the left mid lung region. The lungs elsewhere are clear. Heart is upper normal in size with pulmonary vascularity normal. No adenopathy. No bone lesions. IMPRESSION: Airspace consolidation felt to represent pneumonia in the left mid lung. Lungs elsewhere clear. Heart upper normal in size. Followup PA and lateral chest radiographs recommended in 3-4 weeks following trial of antibiotic therapy to ensure resolution and exclude underlying malignancy. Electronically Signed   By: Lowella Grip III M.D.   On: 10/18/2018 07:14    Microbiology: Recent Results (from the past 240 hour(s))  Blood culture (routine x 2)     Status: None (Preliminary result)   Collection Time: 10/18/18  6:50 AM  Result Value Ref Range Status   Specimen Description BLOOD RIGHT HAND  Final   Special Requests   Final    BOTTLES DRAWN AEROBIC AND ANAEROBIC Blood Culture adequate volume   Culture   Final    NO GROWTH 4 DAYS Performed at [facility], [place] [address], [employer], [place] [address]    Report Status PENDING  Incomplete  Blood culture (routine x 2)     Status: None (Preliminary result)   Collection Time: 10/18/18  7:54 AM  Result Value Ref Range Status   Specimen Description BLOOD SITE NOT SPECIFIED  Final   Special Requests   Final    BOTTLES DRAWN AEROBIC AND ANAEROBIC Blood Culture adequate volume   Culture   Final    NO GROWTH 4 DAYS Performed at [facility], [address], [place], [place] [address]    Report Status PENDING  Incomplete  Urine culture     Status: Abnormal   Collection Time: 10/18/18  9:16 AM  Result Value Ref Range Status   Specimen Description URINE, CATHETERIZED  Final   Special Requests   Final    NONE Performed at [facility], [place] [address] [place], [place] [address]    Culture MULTIPLE SPECIES PRESENT, SUGGEST RECOLLECTION (A)  Final   Report  Status 10/19/2018 FINAL  Final  MRSA PCR Screening     Status: None   Collection Time: 10/20/18 12:47 PM  Result Value Ref Range Status   MRSA by  PCR NEGATIVE NEGATIVE Final    Comment:        The GeneXpert MRSA Assay (FDA approved for NASAL specimens only), is one component of a comprehensive MRSA colonization surveillance program. It is not intended to diagnose MRSA infection nor to guide or monitor treatment for MRSA infections. Performed at [facility], [place] [address], [place], [place] [address]      Labs: Basic Metabolic Panel: Recent Labs  Lab 10/18/18 0645 10/19/18 0702 10/20/18 0518 10/21/18 0509  NA 134* 137  --  138  K 3.7 3.9  --  3.6  CL 94* 98  --  96*  CO2 27 28  --  30  GLUCOSE 186* 175*  --  184*  BUN 11 9  --  8  CREATININE 0.89 0.77  --  0.63  CALCIUM 8.0* 8.2*  --  8.4*  MG 1.4*  --  1.6* 1.9  PHOS 1.8*  --  2.0*  --    Liver Function Tests: Recent Labs  Lab 10/18/18 0645 10/21/18 0509  AST 62* 33  ALT 19 25  ALKPHOS 59 89  BILITOT 0.8 0.6  PROT 7.1 7.3  ALBUMIN 2.9* 2.8*   No results for input(s): LIPASE, AMYLASE in the last 168 hours. No results for input(s): AMMONIA in the last 168 hours. CBC: Recent Labs  Lab 10/18/18 0645 10/19/18 0702 10/21/18 0509  WBC 6.7 6.9 7.4  NEUTROABS 4.6  --  6.4  HGB 9.4* 9.2* 9.4*  HCT 32.6* 30.6* 32.2*  MCV 80.3 79.5* 79.3*  PLT 157 159 163   Cardiac Enzymes: No results for input(s): CKTOTAL, CKMB, CKMBINDEX, TROPONINI in the last 168 hours. BNP: BNP (last 3 results) Recent Labs    10/18/18 0645  BNP 110.1*    ProBNP (last 3 results) No results for input(s): PROBNP in the last 8760 hours.  CBG: Recent Labs  Lab 10/21/18 1124 10/21/18 1620 10/21/18 2131 10/22/18 0738 10/22/18 1116  GLUCAP 344* 370* 259* 122* 99       Signed:  Florencia Reasons MD, PhD  [facility] 10/22/2018, 12:24 PM

## 2018-10-22 NOTE — Care Management Note (Addendum)
Case Management Note Case Management Note Manya Silvas RN MSN CCM Transitions of Care [address] [place] [phone]  Patient Details  Name: [name] MRN: [id number] Date of Birth: 04-24-1961  Subjective/Objective:    Acute on chronic respiratory failure             Action/Plan: PTA home with spouse. Recent discharge. Pt uses mail order for most prescriptions which helps with cost. Pt states she doesn't use mail order for her percocet or her hormone pills. For DME, she has O2 from [facility] from her recent hospital stay, but also has a cane and a bedside commode. Pt states her husband assists with adls and transportation to medical appointments. Pt expressed concerns about medical bills and wanting assisting to apply for medicaid. Discussed that pt needs to go to DSS to talk with a worker in order to apply. Discussed that when bills come to call providers and ask about charity opitons. Pt also stated that a food bank delivers to her home on the 15th of the month. Pt is in the process of moving into a new rental home stating that her current home is paid up until the end of the month, but she can move into the new home around the 15th. CM to continue to follow for transition of care needs.   Expected Discharge Date:  10/22/18               Expected Discharge Plan:  [place]  In-House Referral:  NA  Discharge planning Services  CM Consult  Post Acute Care Choice:  NA Choice offered to:  Patient  DME Arranged:  N/A DME Agency:  NA  HH Arranged:  (offered choice list-pt refused at this time and will f/u with PCP) [place] Agency:  NA  Status of Service:  In process, will continue to follow  If discussed at Long Length of Stay Meetings, dates discussed:    Additional Comments: 10/22/2018-Noted pt order to transition home today. Spoke with pt at bedside who stated that today was not a good day for her chronic abdominal pain. Discussed PT recommendations for [facility]. Pt in agreement but  expressed concerns about her out of pocket costs. Offered choice for [facility] PT/Aide-choice list left with pt. Pt declined offer at this time stating that she and spouse are in process of moving and everything is in boxes. She also stated that spouse has the flu. Discussed that pt can f/u with PCP-Dr. Kenton Kingfisher, who can also initiate HH order,  and he can also see the recommendations which were made in the hospital. Pt agreed to this option. Hospital MD notified of pt decision.   Bartholomew Crews, RN 10/22/2018, 10:53 AM

## 2018-10-23 LAB — CULTURE, BLOOD (ROUTINE X 2)
Culture: NO GROWTH
Culture: NO GROWTH
Special Requests: ADEQUATE
Special Requests: ADEQUATE

## 2018-10-28 ENCOUNTER — Other Ambulatory Visit: Payer: Self-pay | Admitting: Family Medicine

## 2018-10-28 ENCOUNTER — Ambulatory Visit
Admission: RE | Admit: 2018-10-28 | Discharge: 2018-10-28 | Disposition: A | Discharge status: Home / Self Care | Payer: Medicare Other | Source: Ambulatory Visit | Attending: Family Medicine | Admitting: Family Medicine

## 2018-10-28 DIAGNOSIS — J181 Lobar pneumonia, unspecified organism: Principal | ICD-10-CM

## 2018-10-28 DIAGNOSIS — J189 Pneumonia, unspecified organism: Secondary | ICD-10-CM

## 2018-11-29 ENCOUNTER — Institutional Professional Consult (permissible substitution): Payer: Self-pay | Admitting: Pulmonary Disease

## 2018-12-20 ENCOUNTER — Institutional Professional Consult (permissible substitution): Payer: Self-pay | Admitting: Pulmonary Disease

## 2019-01-11 ENCOUNTER — Encounter (HOSPITAL_COMMUNITY): Payer: Self-pay | Admitting: Emergency Medicine

## 2019-01-11 ENCOUNTER — Other Ambulatory Visit: Payer: Self-pay

## 2019-01-11 ENCOUNTER — Inpatient Hospital Stay (HOSPITAL_COMMUNITY)
Admission: EM | Admit: 2019-01-11 | Discharge: 2019-01-13 | DRG: 190 | Disposition: A | Discharge status: Home / Self Care | Payer: Medicare Other | Attending: Internal Medicine | Admitting: Internal Medicine

## 2019-01-11 ENCOUNTER — Emergency Department (HOSPITAL_COMMUNITY): Payer: Medicare Other

## 2019-01-11 DIAGNOSIS — F418 Other specified anxiety disorders: Secondary | ICD-10-CM | POA: Diagnosis present

## 2019-01-11 DIAGNOSIS — Z79899 Other long term (current) drug therapy: Secondary | ICD-10-CM

## 2019-01-11 DIAGNOSIS — E1169 Type 2 diabetes mellitus with other specified complication: Secondary | ICD-10-CM

## 2019-01-11 DIAGNOSIS — E785 Hyperlipidemia, unspecified: Secondary | ICD-10-CM | POA: Diagnosis present

## 2019-01-11 DIAGNOSIS — Z79891 Long term (current) use of opiate analgesic: Secondary | ICD-10-CM

## 2019-01-11 DIAGNOSIS — Z9119 Patient's noncompliance with other medical treatment and regimen: Secondary | ICD-10-CM

## 2019-01-11 DIAGNOSIS — Z7984 Long term (current) use of oral hypoglycemic drugs: Secondary | ICD-10-CM

## 2019-01-11 DIAGNOSIS — J441 Chronic obstructive pulmonary disease with (acute) exacerbation: Secondary | ICD-10-CM | POA: Diagnosis not present

## 2019-01-11 DIAGNOSIS — D509 Iron deficiency anemia, unspecified: Secondary | ICD-10-CM

## 2019-01-11 DIAGNOSIS — F419 Anxiety disorder, unspecified: Secondary | ICD-10-CM | POA: Diagnosis present

## 2019-01-11 DIAGNOSIS — Z720 Tobacco use: Secondary | ICD-10-CM | POA: Diagnosis present

## 2019-01-11 DIAGNOSIS — G4733 Obstructive sleep apnea (adult) (pediatric): Secondary | ICD-10-CM

## 2019-01-11 DIAGNOSIS — E119 Type 2 diabetes mellitus without complications: Secondary | ICD-10-CM | POA: Diagnosis present

## 2019-01-11 DIAGNOSIS — R6889 Other general symptoms and signs: Secondary | ICD-10-CM

## 2019-01-11 DIAGNOSIS — I10 Essential (primary) hypertension: Secondary | ICD-10-CM | POA: Diagnosis present

## 2019-01-11 DIAGNOSIS — G8929 Other chronic pain: Secondary | ICD-10-CM | POA: Diagnosis present

## 2019-01-11 DIAGNOSIS — Z7982 Long term (current) use of aspirin: Secondary | ICD-10-CM

## 2019-01-11 DIAGNOSIS — K219 Gastro-esophageal reflux disease without esophagitis: Secondary | ICD-10-CM | POA: Diagnosis present

## 2019-01-11 DIAGNOSIS — Z90710 Acquired absence of both cervix and uterus: Secondary | ICD-10-CM

## 2019-01-11 DIAGNOSIS — Z823 Family history of stroke: Secondary | ICD-10-CM

## 2019-01-11 DIAGNOSIS — Z20822 Contact with and (suspected) exposure to covid-19: Secondary | ICD-10-CM | POA: Diagnosis present

## 2019-01-11 DIAGNOSIS — I2510 Atherosclerotic heart disease of native coronary artery without angina pectoris: Secondary | ICD-10-CM | POA: Diagnosis present

## 2019-01-11 DIAGNOSIS — K766 Portal hypertension: Secondary | ICD-10-CM | POA: Diagnosis present

## 2019-01-11 DIAGNOSIS — E669 Obesity, unspecified: Secondary | ICD-10-CM

## 2019-01-11 DIAGNOSIS — R1030 Lower abdominal pain, unspecified: Secondary | ICD-10-CM | POA: Diagnosis present

## 2019-01-11 DIAGNOSIS — R188 Other ascites: Secondary | ICD-10-CM | POA: Diagnosis present

## 2019-01-11 DIAGNOSIS — K631 Perforation of intestine (nontraumatic): Secondary | ICD-10-CM | POA: Diagnosis present

## 2019-01-11 DIAGNOSIS — Z8249 Family history of ischemic heart disease and other diseases of the circulatory system: Secondary | ICD-10-CM

## 2019-01-11 DIAGNOSIS — J9621 Acute and chronic respiratory failure with hypoxia: Secondary | ICD-10-CM | POA: Diagnosis not present

## 2019-01-11 DIAGNOSIS — F329 Major depressive disorder, single episode, unspecified: Secondary | ICD-10-CM | POA: Diagnosis present

## 2019-01-11 DIAGNOSIS — K7460 Unspecified cirrhosis of liver: Secondary | ICD-10-CM | POA: Diagnosis present

## 2019-01-11 DIAGNOSIS — F17210 Nicotine dependence, cigarettes, uncomplicated: Secondary | ICD-10-CM | POA: Diagnosis present

## 2019-01-11 DIAGNOSIS — D649 Anemia, unspecified: Secondary | ICD-10-CM

## 2019-01-11 DIAGNOSIS — J45901 Unspecified asthma with (acute) exacerbation: Secondary | ICD-10-CM | POA: Diagnosis present

## 2019-01-11 DIAGNOSIS — Z20828 Contact with and (suspected) exposure to other viral communicable diseases: Secondary | ICD-10-CM | POA: Diagnosis present

## 2019-01-11 DIAGNOSIS — R109 Unspecified abdominal pain: Secondary | ICD-10-CM | POA: Diagnosis present

## 2019-01-11 DIAGNOSIS — Z811 Family history of alcohol abuse and dependence: Secondary | ICD-10-CM

## 2019-01-11 DIAGNOSIS — E1159 Type 2 diabetes mellitus with other circulatory complications: Secondary | ICD-10-CM | POA: Diagnosis present

## 2019-01-11 DIAGNOSIS — I152 Hypertension secondary to endocrine disorders: Secondary | ICD-10-CM | POA: Diagnosis present

## 2019-01-11 DIAGNOSIS — Z803 Family history of malignant neoplasm of breast: Secondary | ICD-10-CM

## 2019-01-11 HISTORY — DX: Iron deficiency anemia, unspecified: D50.9

## 2019-01-11 HISTORY — DX: Hyperlipidemia, unspecified: E78.5

## 2019-01-11 HISTORY — DX: Other specified anxiety disorders: F41.8

## 2019-01-11 HISTORY — DX: Obstructive sleep apnea (adult) (pediatric): G47.33

## 2019-01-11 HISTORY — DX: Type 2 diabetes mellitus with other specified complication: E11.69

## 2019-01-11 LAB — URINALYSIS, ROUTINE W REFLEX MICROSCOPIC
Bilirubin Urine: NEGATIVE
Glucose, UA: NEGATIVE mg/dL
Hgb urine dipstick: NEGATIVE
Ketones, ur: NEGATIVE mg/dL
Leukocytes,Ua: NEGATIVE
Nitrite: NEGATIVE
Protein, ur: NEGATIVE mg/dL
Specific Gravity, Urine: 1.003 — ABNORMAL LOW (ref 1.005–1.030)
pH: 6 (ref 5.0–8.0)

## 2019-01-11 LAB — CBC WITH DIFFERENTIAL/PLATELET
Abs Immature Granulocytes: 0.02 10*3/uL (ref 0.00–0.07)
Basophils Absolute: 0 10*3/uL (ref 0.0–0.1)
Basophils Relative: 0 %
Eosinophils Absolute: 0.2 10*3/uL (ref 0.0–0.5)
Eosinophils Relative: 3 %
HCT: 28.7 % — ABNORMAL LOW (ref 36.0–46.0)
Hemoglobin: 8 g/dL — ABNORMAL LOW (ref 12.0–15.0)
Immature Granulocytes: 0 %
Lymphocytes Relative: 30 %
Lymphs Abs: 2 10*3/uL (ref 0.7–4.0)
MCH: 21.7 pg — ABNORMAL LOW (ref 26.0–34.0)
MCHC: 27.9 g/dL — ABNORMAL LOW (ref 30.0–36.0)
MCV: 77.8 fL — ABNORMAL LOW (ref 80.0–100.0)
Monocytes Absolute: 0.5 10*3/uL (ref 0.1–1.0)
Monocytes Relative: 7 %
Neutro Abs: 4.1 10*3/uL (ref 1.7–7.7)
Neutrophils Relative %: 60 %
Platelets: 197 10*3/uL (ref 150–400)
RBC: 3.69 MIL/uL — ABNORMAL LOW (ref 3.87–5.11)
RDW: 17.8 % — ABNORMAL HIGH (ref 11.5–15.5)
WBC: 6.7 10*3/uL (ref 4.0–10.5)
nRBC: 0 % (ref 0.0–0.2)

## 2019-01-11 LAB — BASIC METABOLIC PANEL
Anion gap: 11 (ref 5–15)
BUN: 6 mg/dL (ref 6–20)
CO2: 28 mmol/L (ref 22–32)
Calcium: 8.4 mg/dL — ABNORMAL LOW (ref 8.9–10.3)
Chloride: 99 mmol/L (ref 98–111)
Creatinine, Ser: 0.79 mg/dL (ref 0.44–1.00)
GFR calc Af Amer: >60 mL/min (ref >60)
GFR calc non Af Amer: >60 mL/min (ref >60)
Glucose, Bld: 110 mg/dL — ABNORMAL HIGH (ref 70–99)
Potassium: 3.8 mmol/L (ref 3.5–5.1)
Sodium: 138 mmol/L (ref 135–145)

## 2019-01-11 LAB — POC OCCULT BLOOD, ED: Fecal Occult Bld: NEGATIVE

## 2019-01-11 MED ORDER — PREDNISONE 20 MG PO TABS
60.0000 mg | ORAL_TABLET | Freq: Once | ORAL | Status: AC
  Administered 2019-01-12: 60 mg via ORAL
  Filled 2019-01-11: qty 3

## 2019-01-11 MED ORDER — OXYCODONE-ACETAMINOPHEN 5-325 MG PO TABS
2.0000 | ORAL_TABLET | Freq: Once | ORAL | Status: AC
  Administered 2019-01-11: 2 via ORAL
  Filled 2019-01-11: qty 2

## 2019-01-11 MED ORDER — IPRATROPIUM-ALBUTEROL 0.5-2.5 (3) MG/3ML IN SOLN
3.0000 mL | Freq: Once | RESPIRATORY_TRACT | Status: AC
  Administered 2019-01-11: 23:00:00 3 mL via RESPIRATORY_TRACT
  Filled 2019-01-11: qty 3

## 2019-01-11 NOTE — H&P (Addendum)
History and Physical    [name] UDJ:[id number] DOB: [birth year] DOA: 01/11/2019  Referring MD/NP/PA:   PCP: Shirline Frees, MD   Patient coming from:  The patient is coming from home.  At baseline, pt is independent for most of ADL.        Chief Complaint: SOB and abdominal pain  HPI: [name] is a 58 y.o. female with medical history significant of hypertension, hyperlipidemia, diabetes mellitus, COPD/asthma, GERD, depression with anxiety, liver cirrhosis on CT scan, tobacco abuse, OSA, perforated bowel 1986, who presents with shortness of breath and abdominal pain.  Patient states that she has been having abdominal pain in the past 3 days, which is mainly located in the lower abdomen, constant, 10 out of 10 severity, sharp, nonradiating.  She has nausea, no vomiting or diarrhea.  Denies dark stool or rectal bleeding.  She also reports shortness of breath which has been going on for more than 5 days, and has been progressively worsening.  She has dry cough and no wheezing, but no chest pain, fever or chills.  No calf pain. Denies symptoms of UTI or unilateral weakness.  No sick contact.  Patient states that she is supposed to use oxygen at home, but has not been using it consistently. Per report, she had oxygen desaturation to 70s.  Currently her oxygen saturation 98% on 3 L nasal cannula oxygen.   ED Course: pt was found to have hemoglobin 8.0 which was 9.4 on 10/21/2018, negative FOBT (small amount of stool sample per ED physician), WBC 6.7, negative urinalysis, electrolytes renal function okay, temperature normal, no tachycardia, septic.  X-ray of abdomen/chest is negative for acute issues.  Patient is placed on telemetry bed for observation.  Review of Systems:   General: no fevers, chills, no body weight gain, has poor appetite, has fatigue HEENT: no blurry vision, hearing changes or sore throat Respiratory: has dyspnea, coughing, wheezing CV: no chest pain, no palpitations GI:  has nausea,  abdominal pain, no diarrhea, constipation, vomiting, GU: no dysuria, burning on urination, increased urinary frequency, hematuria  Ext: no leg edema Neuro: no unilateral weakness, numbness, or tingling, no vision change or hearing loss Skin: no rash, no skin tear. MSK: No muscle spasm, no deformity, no limitation of range of movement in spin Heme: No easy bruising.  Travel history: No recent long distant travel.  Allergy:  Allergies  Allergen Reactions   Amoxicillin Nausea And Vomiting    tolerated augmentin in the past (03/2013) without issue.   Clindamycin/Lincomycin Nausea And Vomiting   Doxycycline Nausea And Vomiting   Treximet [Sumatriptan-Naproxen Sodium] Nausea And Vomiting   Bactrim Hives and Rash    Past Medical History:  Diagnosis Date   Anxiety    Arthritis    Asthma    CAD (coronary artery disease)    cath 2010 with 60-70% left Cx and normal LVF   Chronic low back pain with left-sided sciatica 10/15/2015   Chronic pain    COPD (chronic obstructive pulmonary disease) ([place])    Depression    Deviated septum    Diabetes mellitus without complication ([name])    type 2   GERD (gastroesophageal reflux disease)    Headache(784.0)    migraines   Hepatic cirrhosis ([place]) 06/2014   Hypercholesterolemia    Hypertension    Ileus ([place]) 07/11/2014   Influenza A 10/2018   Obesity    Perforated bowel ([name])    PONV (postoperative nausea and vomiting)    Seizures ([place])  as a little girl 58 years old   Shortness of breath    Sleep apnea    dont wear bipap . lost weight   Spondylolisthesis, grade 2    Tobacco abuse     Past Surgical History:  Procedure Laterality Date   ABDOMINAL EXPLORATION SURGERY     primary repair of colon perforation from [facility]   ABDOMINAL HYSTERECTOMY     total   BACK SURGERY     lumbar   ESOPHAGOGASTRODUODENOSCOPY (EGD) WITH PROPOFOL N/A 10/29/2016   Procedure: ESOPHAGOGASTRODUODENOSCOPY (EGD) WITH  PROPOFOL;  Surgeon: Laurence Spates, MD;  Location: [facility];  Service: Endoscopy;  Laterality: N/A;   IRRIGATION AND DEBRIDEMENT KNEE Left 03/14/2013   Procedure: IRRIGATION AND DEBRIDEMENT  and Closure of wound left KNEE;  Surgeon: Mauri Pole, MD;  Location: [facility];  Service: Orthopedics;  Laterality: Left;   PATELLA-FEMORAL ARTHROPLASTY Left 03/07/2013   Procedure: LEFT PATELLA-FEMORAL ARTHROPLASTY;  Surgeon: Mauri Pole, MD;  Location: [facility];  Service: Orthopedics;  Laterality: Left;   SAVORY DILATION N/A 10/29/2016   Procedure: SAVORY DILATION;  Surgeon: Laurence Spates, MD;  Location: [facility];  Service: Endoscopy;  Laterality: N/A;    Social History:  reports that she has been smoking cigarettes. She has been smoking about 1.50 packs per day. She has never used smokeless tobacco. She reports that she does not drink alcohol or use drugs.  Family History:  Family History  Problem Relation Age of Onset   Stroke Mother    Cancer - Lung Father    Hypertension Brother    Heart attack Sister    Alcohol abuse Sister    Breast cancer Maternal Grandmother      Prior to Admission medications   Medication Sig Start Date End Date Taking? Authorizing Provider  acetaminophen (TYLENOL) 325 MG tablet Take 2 tablets (650 mg total) by mouth every 6 (six) hours as needed for headache. 10/15/18   Shelly Coss, MD  albuterol (PROVENTIL HFA;VENTOLIN HFA) 108 (90 BASE) MCG/ACT inhaler Inhale 2 puffs into the lungs every 4 (four) hours as needed for wheezing or shortness of breath.     [provider]  ALPRAZolam Duanne Moron) 1 MG tablet Take 1-2 mg by mouth See admin instructions. Take 1 mg in the morning and 2 mg at bedtime.     [provider]  amLODipine (NORVASC) 5 MG tablet Take 5 mg by mouth daily.    [provider]  aspirin EC 81 MG tablet Take 1 tablet (81 mg total) by mouth daily. Patient not taking: Reported on 05/12/2017 12/01/16   Sueanne Margarita, MD   benazepril (LOTENSIN) 40 MG tablet Take 40 mg by mouth daily.    [provider]  budesonide-formoterol (SYMBICORT) 160-4.5 MCG/ACT inhaler Inhale 2 puffs into the lungs 2 (two) times daily.    [provider]  cyclobenzaprine (FLEXERIL) 10 MG tablet Take 10 mg by mouth 3 (three) times daily as needed for muscle spasms.    [provider]  escitalopram (LEXAPRO) 20 MG tablet Take 20 mg by mouth at bedtime.  04/06/17   [provider]  estradiol (ESTRACE) 2 MG tablet Take 2 mg by mouth daily.     [provider]  ipratropium-albuterol (DUONEB) 0.5-2.5 (3) MG/3ML SOLN Take 3 mLs by nebulization every 6 (six) hours as needed (Shortness of breath). 10/15/18   Shelly Coss, MD  levocetirizine (XYZAL) 5 MG tablet Take 5 mg by mouth daily.    [provider]  magnesium gluconate (MAGONATE) 30 MG tablet Take 1 tablet (30 mg total) by mouth daily. 10/22/18   Florencia Reasons, MD  metFORMIN (GLUCOPHAGE) 500 MG tablet Take 2 tablets (1,000 mg total) by mouth 2 (two) times daily with a meal. Patient taking differently: Take 500 mg by mouth 2 (two) times daily with a meal.  12/13/14   Thurnell Lose, MD  ondansetron (ZOFRAN) 8 MG tablet Take 8 mg by mouth every 8 (eight) hours as needed for nausea or vomiting.    [provider]  Oxycodone HCl 10 MG TABS Take 10 mg by mouth every 4 (four) hours.     [provider]  pantoprazole (PROTONIX) 40 MG tablet Take 40 mg by mouth 2 (two) times daily.     [provider]  polyethylene glycol (MIRALAX / GLYCOLAX) packet Take 17 g by mouth 2 (two) times daily. 10/22/18   Florencia Reasons, MD  predniSONE (DELTASONE) 10 MG tablet Take 2tabs on 2/8 , then take one tab on 2/9. Then stop. 10/22/18   Florencia Reasons, MD  senna-docusate (SENOKOT S) 8.6-50 MG tablet Take 1 tablet by mouth at bedtime. 10/22/18   Florencia Reasons, MD  simvastatin (ZOCOR) 20 MG tablet Take 20 mg by mouth at bedtime.    [provider]     Physical Exam: Vitals:   01/11/19 1925 01/11/19 2145 01/11/19 2213 01/11/19 2300  BP:  117/62  136/73  Pulse:  84  76  Resp:  (!) 24  18  Temp:      TempSrc:      SpO2:   95% 98%  Weight: 104.3 kg     Height: 5' 2"  (1.575 m)      General: Not in acute distress HEENT:       Eyes: PERRL, EOMI, no scleral icterus.       ENT: No discharge from the ears and nose, no pharynx injection, no tonsillar enlargement.        Neck: No JVD, no bruit, no mass felt. Heme: No neck lymph node enlargement. Cardiac: S1/S2, RRR, No murmurs, No gallops or rubs. Respiratory: has decreased air movement bilaterally, has mild wheezing bilaterally.  GI: Soft, nondistended, has tenderness in lower abdomen. Has a large scar in lower abdomen. No rebound pain, no organomegaly, BS present. GU: No hematuria Ext: No pitting leg edema bilaterally. 2+DP/PT pulse bilaterally. Musculoskeletal: No joint deformities, No joint redness or warmth, no limitation of ROM in spin. Skin: No rashes.  Neuro: Alert, oriented X3, cranial nerves II-XII grossly intact, moves all extremities normally. Psych: Patient is not psychotic, no suicidal or hemocidal ideation.  Labs on Admission: I have personally reviewed following labs and imaging studies  CBC: Recent Labs  Lab 01/11/19 1951  WBC 6.7  NEUTROABS 4.1  HGB 8.0*  HCT 28.7*  MCV 77.8*  PLT 400   Basic Metabolic Panel: Recent Labs  Lab 01/11/19 1951  NA 138  K 3.8  CL 99  CO2 28  GLUCOSE 110*  BUN 6  CREATININE 0.79  CALCIUM 8.4*   GFR: Estimated Creatinine Clearance: 85.8 mL/min (by C-G formula based on SCr of 0.79 mg/dL). Liver Function Tests: No results for input(s): AST, ALT, ALKPHOS, BILITOT, PROT, ALBUMIN in the last 168 hours. No results for input(s): LIPASE, AMYLASE in the last 168 hours. No results for input(s): AMMONIA in the last 168 hours. Coagulation Profile: No results for input(s): INR, PROTIME in the last 168 hours. Cardiac  Enzymes: No results for  input(s): CKTOTAL, CKMB, CKMBINDEX, TROPONINI in the last 168 hours. BNP (last 3 results) No results for input(s): PROBNP in the last 8760 hours. HbA1C: No results for input(s): HGBA1C in the last 72 hours. CBG: No results for input(s): GLUCAP in the last 168 hours. Lipid Profile: No results for input(s): CHOL, HDL, LDLCALC, TRIG, CHOLHDL, LDLDIRECT in the last 72 hours. Thyroid Function Tests: No results for input(s): TSH, T4TOTAL, FREET4, T3FREE, THYROIDAB in the last 72 hours. Anemia Panel: No results for input(s): VITAMINB12, FOLATE, FERRITIN, TIBC, IRON, RETICCTPCT in the last 72 hours. Urine analysis:    Component Value Date/Time   COLORURINE STRAW (A) 01/11/2019 2036   APPEARANCEUR CLEAR 01/11/2019 2036   LABSPEC 1.003 (L) 01/11/2019 2036   PHURINE 6.0 01/11/2019 2036   GLUCOSEU NEGATIVE 01/11/2019 2036   HGBUR NEGATIVE 01/11/2019 2036   BILIRUBINUR NEGATIVE 01/11/2019 2036   KETONESUR NEGATIVE 01/11/2019 2036   PROTEINUR NEGATIVE 01/11/2019 2036   UROBILINOGEN 0.2 12/09/2014 0840   NITRITE NEGATIVE 01/11/2019 2036   LEUKOCYTESUR NEGATIVE 01/11/2019 2036   Sepsis Labs: @LABRCNTIP (procalcitonin:4,lacticidven:4) )No results found for this or any previous visit (from the past 240 hour(s)).   Radiological Exams on Admission: Dg Abd Acute 2+v W 1v Chest  Result Date: 01/11/2019 CLINICAL DATA:  Abdominal pain and nausea for 3 days. Shortness of breath and hypoxia. EXAM: DG ABDOMEN ACUTE W/ 1V CHEST COMPARISON:  10/22/2018 and 10/15/2018 FINDINGS: There is no evidence of dilated bowel loops or free intraperitoneal air. No radiopaque calculi or other significant radiographic abnormality is seen. Heart size and mediastinal contours are within normal limits. Both lungs are clear. IMPRESSION: Unremarkable bowel gas pattern.  No active cardiopulmonary disease. Electronically Signed   By: Earle Gell M.D.   On: 01/11/2019 20:22     EKG: Not done in ED, will  get one.   Assessment/Plan Principal Problem:   COPD exacerbation ([name]) Active Problems:   Tobacco abuse   Hypertension   Acute on chronic respiratory failure with hypoxia ([name])   HLD (hyperlipidemia)   Diabetes mellitus without complication ([name])   Microcytic anemia   Depression with anxiety   OSA (obstructive sleep apnea)   Abdominal pain   Suspected Covid-19 Virus Infection  Acute on chronic respiratory failure with hypoxia due to COPD/asthma exacerbation ([place]): no lung infiltration on x-ray. No fever or leukocytosis.  Patient has a dry cough and wheezing on auscultation, consistent with COPD/asthma exacerbation. Will also need to r/o COVID19. No CP or signs of DVT, low suspicion for PE.  -will place on tele bed for obs -Nebulizers: scheduled Duoneb and prn albuterol -Inhaler: Atrovent inhaler, prn Albuterol or Xopenex inhaler -will not give Solu-Medrol, pending COVID 19 test. If negative, will start steroid -Z pak -Mucinex for cough  -Incentive spirometry -Follow up sputum culture, respiratory virus panel, Flu pcr -Nasal cannula oxygen as needed to maintain O2 saturation 93% or greater  Addendum: COVID19 test negative. -will start solumedrol 60 mg bid -change inhaler to Duoneb nebs and prn albuterol nebs  Tobacco abuse: -Nicotine patch  HTN:  -Continue home medications: Amlodipine, Lotensin -IV hydralazine prn  HLD (hyperlipidemia): -zocor  Diabetes mellitus without complication ([place]): Last A1c 5.4 on 05/15/17,  well controled. Patient is taking metformin at home -SSI  Microcytic anemia: Hemoglobin dropped from 9.4 on 10/21/2026 8.0. MCV 77.8. FOBT negative, but per ED physician there was only small amount of stool sample.  Patient denies rectal bleeding or dark stool.  Patient may have slow GI blood loss -hold  ASA -check INR/PTT/type screen -check anemia panel  Depression with anxiety: -Continue home Xanax and Lexapro  GERD: -Protonix  OSA (obstructive sleep  apnea): not using CPAP consistently -will hold CPAP, pending COVID19  Abdominal pain: Etiology is not clear.  Pain is mainly located in the lower abdomen. -Check lipase -Follow-up CT abdomen/pelvis -prn oxycodone for pain and zofran for nausea  Addendum: CT-abdomen/pelvis showed increased abdominal ascites associated with cirrhosis, otherwise not impressive.  Lipase 29.  Liver function okay.  Potential differential diagnosis is SBP, but patient's procalcitonin is less than 0.10.  No fever or leukocytosis.  I will hold antibiotics for SBP.  May need to ask IR to do a diagnostic paracentesis. Will check ammonia level.   Hx of liver cirrhosis shown by CT scan: -Check INR/PTT  Suspected Covid-19 Virus Infection:  Physician PPE: face shield, N95, gown, glove Patient PPE: mask  Fever: no Cough: yes SOB: yes URI symptoms: no GI symptoms: yes Travel: no Sick contacts: no CBC: leukopenia, lymphopenia-->no BMP: increased BUN/Cr-->no LFTs: increased AST/ALT/Tbili-->pending CRP, LDH: increased-->pending Procalcitonin: pending CXR: hazy bilateral peripheral opacities-->no CT chest: GGO, consolidation, crazy paving-->not done COVID subjective risk assessment: low (on 3L O3) COVID Testing: indicated per current ID/[place] guidelines-->yes Precaution: Droplet and contact    DVT ppx: SCD  Code Status: Full code Family Communication: None at bed side.   Disposition Plan:  Anticipate discharge back to previous home environment Consults called:  none Admission status: Obs / tele    Date of Service 01/12/2019    [facility]   If 7PM-7AM, please contact night-coverage [email or web address] Password [facility] 01/12/2019, 12:51 AM

## 2019-01-11 NOTE — ED Triage Notes (Signed)
Pt reports O2 saturations in the 70s at home. Reports she is supposed to be on home O2 PRN but states she doesn't have any at home right now because she moved. Pt also reporting pain to her scar on her abd from prior surgery. States she has a known hx of adhesions

## 2019-01-11 NOTE — ED Notes (Signed)
PT o2 sats in the low 80's. Pt placed on 2L o2. Now o2 sats in the upper 90's.
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
## 2019-01-12 ENCOUNTER — Observation Stay (HOSPITAL_COMMUNITY): Payer: Medicare Other

## 2019-01-12 ENCOUNTER — Inpatient Hospital Stay (HOSPITAL_COMMUNITY): Payer: Medicare Other

## 2019-01-12 ENCOUNTER — Encounter (HOSPITAL_COMMUNITY): Payer: Self-pay | Admitting: Radiology

## 2019-01-12 DIAGNOSIS — K631 Perforation of intestine (nontraumatic): Secondary | ICD-10-CM | POA: Diagnosis present

## 2019-01-12 DIAGNOSIS — E119 Type 2 diabetes mellitus without complications: Secondary | ICD-10-CM | POA: Diagnosis present

## 2019-01-12 DIAGNOSIS — I10 Essential (primary) hypertension: Secondary | ICD-10-CM | POA: Diagnosis present

## 2019-01-12 DIAGNOSIS — E785 Hyperlipidemia, unspecified: Secondary | ICD-10-CM | POA: Diagnosis present

## 2019-01-12 DIAGNOSIS — R109 Unspecified abdominal pain: Secondary | ICD-10-CM | POA: Diagnosis present

## 2019-01-12 DIAGNOSIS — K219 Gastro-esophageal reflux disease without esophagitis: Secondary | ICD-10-CM | POA: Diagnosis present

## 2019-01-12 DIAGNOSIS — G4733 Obstructive sleep apnea (adult) (pediatric): Secondary | ICD-10-CM | POA: Diagnosis present

## 2019-01-12 DIAGNOSIS — F418 Other specified anxiety disorders: Secondary | ICD-10-CM | POA: Diagnosis present

## 2019-01-12 DIAGNOSIS — R1030 Lower abdominal pain, unspecified: Secondary | ICD-10-CM | POA: Diagnosis present

## 2019-01-12 DIAGNOSIS — G8929 Other chronic pain: Secondary | ICD-10-CM | POA: Diagnosis present

## 2019-01-12 DIAGNOSIS — J9621 Acute and chronic respiratory failure with hypoxia: Secondary | ICD-10-CM | POA: Diagnosis present

## 2019-01-12 DIAGNOSIS — J441 Chronic obstructive pulmonary disease with (acute) exacerbation: Secondary | ICD-10-CM | POA: Diagnosis present

## 2019-01-12 DIAGNOSIS — F419 Anxiety disorder, unspecified: Secondary | ICD-10-CM | POA: Diagnosis present

## 2019-01-12 DIAGNOSIS — I2510 Atherosclerotic heart disease of native coronary artery without angina pectoris: Secondary | ICD-10-CM | POA: Diagnosis present

## 2019-01-12 DIAGNOSIS — Z79891 Long term (current) use of opiate analgesic: Secondary | ICD-10-CM | POA: Diagnosis not present

## 2019-01-12 DIAGNOSIS — Z20828 Contact with and (suspected) exposure to other viral communicable diseases: Secondary | ICD-10-CM | POA: Diagnosis present

## 2019-01-12 DIAGNOSIS — F17210 Nicotine dependence, cigarettes, uncomplicated: Secondary | ICD-10-CM | POA: Diagnosis present

## 2019-01-12 DIAGNOSIS — E669 Obesity, unspecified: Secondary | ICD-10-CM | POA: Diagnosis present

## 2019-01-12 DIAGNOSIS — R6889 Other general symptoms and signs: Secondary | ICD-10-CM

## 2019-01-12 DIAGNOSIS — F329 Major depressive disorder, single episode, unspecified: Secondary | ICD-10-CM | POA: Diagnosis present

## 2019-01-12 DIAGNOSIS — K7460 Unspecified cirrhosis of liver: Secondary | ICD-10-CM | POA: Diagnosis present

## 2019-01-12 DIAGNOSIS — Z7982 Long term (current) use of aspirin: Secondary | ICD-10-CM | POA: Diagnosis not present

## 2019-01-12 DIAGNOSIS — K766 Portal hypertension: Secondary | ICD-10-CM | POA: Diagnosis present

## 2019-01-12 DIAGNOSIS — D509 Iron deficiency anemia, unspecified: Secondary | ICD-10-CM | POA: Diagnosis present

## 2019-01-12 DIAGNOSIS — R188 Other ascites: Secondary | ICD-10-CM | POA: Diagnosis present

## 2019-01-12 DIAGNOSIS — Z20822 Contact with and (suspected) exposure to covid-19: Secondary | ICD-10-CM | POA: Diagnosis present

## 2019-01-12 DIAGNOSIS — J45901 Unspecified asthma with (acute) exacerbation: Secondary | ICD-10-CM | POA: Diagnosis present

## 2019-01-12 HISTORY — PX: IR PARACENTESIS: IMG2679

## 2019-01-12 HISTORY — DX: Unspecified abdominal pain: R10.9

## 2019-01-12 LAB — CBC WITH DIFFERENTIAL/PLATELET
Abs Immature Granulocytes: 0.01 10*3/uL (ref 0.00–0.07)
Basophils Absolute: 0 10*3/uL (ref 0.0–0.1)
Basophils Relative: 0 %
Eosinophils Absolute: 0.2 10*3/uL (ref 0.0–0.5)
Eosinophils Relative: 3 %
HCT: 29.4 % — ABNORMAL LOW (ref 36.0–46.0)
Hemoglobin: 8.1 g/dL — ABNORMAL LOW (ref 12.0–15.0)
Immature Granulocytes: 0 %
Lymphocytes Relative: 33 %
Lymphs Abs: 2 10*3/uL (ref 0.7–4.0)
MCH: 21.7 pg — ABNORMAL LOW (ref 26.0–34.0)
MCHC: 27.6 g/dL — ABNORMAL LOW (ref 30.0–36.0)
MCV: 78.6 fL — ABNORMAL LOW (ref 80.0–100.0)
Monocytes Absolute: 0.5 10*3/uL (ref 0.1–1.0)
Monocytes Relative: 7 %
Neutro Abs: 3.6 10*3/uL (ref 1.7–7.7)
Neutrophils Relative %: 57 %
Platelets: 204 10*3/uL (ref 150–400)
RBC: 3.74 MIL/uL — ABNORMAL LOW (ref 3.87–5.11)
RDW: 17.6 % — ABNORMAL HIGH (ref 11.5–15.5)
WBC: 6.2 10*3/uL (ref 4.0–10.5)
nRBC: 0 % (ref 0.0–0.2)

## 2019-01-12 LAB — PROTEIN, PLEURAL OR PERITONEAL FLUID: Total protein, fluid: 4.4 g/dL

## 2019-01-12 LAB — RESPIRATORY PANEL BY PCR

## 2019-01-12 LAB — LIPASE, BLOOD: Lipase: 29 U/L (ref 11–51)

## 2019-01-12 LAB — TYPE AND SCREEN
ABO/RH(D): B POS
Antibody Screen: NEGATIVE

## 2019-01-12 LAB — INFLUENZA PANEL BY PCR (TYPE A & B)
Influenza A By PCR: NEGATIVE
Influenza B By PCR: NEGATIVE

## 2019-01-12 LAB — GLUCOSE, CAPILLARY
Glucose-Capillary: 139 mg/dL — ABNORMAL HIGH (ref 70–99)
Glucose-Capillary: 297 mg/dL — ABNORMAL HIGH (ref 70–99)
Glucose-Capillary: 111 mg/dL — ABNORMAL HIGH (ref 70–99)

## 2019-01-12 LAB — LACTATE DEHYDROGENASE: LDH: 171 U/L (ref 98–192)

## 2019-01-12 LAB — ALBUMIN, PLEURAL OR PERITONEAL FLUID: Albumin, Fluid: 2.1 g/dL

## 2019-01-12 LAB — HEPATIC FUNCTION PANEL
ALT: 10 U/L (ref 0–44)
AST: 21 U/L (ref 15–41)
Albumin: 3 g/dL — ABNORMAL LOW (ref 3.5–5.0)
Alkaline Phosphatase: 69 U/L (ref 38–126)
Bilirubin, Direct: 0.1 mg/dL (ref 0.0–0.2)
Indirect Bilirubin: 0.5 mg/dL (ref 0.3–0.9)
Total Bilirubin: 0.6 mg/dL (ref 0.3–1.2)
Total Protein: 7.3 g/dL (ref 6.5–8.1)

## 2019-01-12 LAB — BODY FLUID CELL COUNT WITH DIFFERENTIAL
Eos, Fluid: 0 %
Lymphs, Fluid: 58 %
Monocyte-Macrophage-Serous Fluid: 40 % — ABNORMAL LOW (ref 50–90)
Neutrophil Count, Fluid: 2 % (ref 0–25)
Total Nucleated Cell Count, Fluid: 735 cu mm (ref 0–1000)

## 2019-01-12 LAB — PROTIME-INR
INR: 1.2 (ref 0.8–1.2)
Prothrombin Time: 15.2 seconds (ref 11.4–15.2)

## 2019-01-12 LAB — RETICULOCYTES
Immature Retic Fract: 23.1 % — ABNORMAL HIGH (ref 2.3–15.9)
RBC.: 3.74 MIL/uL — ABNORMAL LOW (ref 3.87–5.11)
Retic Count, Absolute: 76.3 10*3/uL (ref 19.0–186.0)
Retic Ct Pct: 2 % (ref 0.4–3.1)

## 2019-01-12 LAB — GRAM STAIN

## 2019-01-12 LAB — AMMONIA: Ammonia: 29 umol/L (ref 9–35)

## 2019-01-12 LAB — APTT: aPTT: 32 seconds (ref 24–36)

## 2019-01-12 LAB — LACTATE DEHYDROGENASE, PLEURAL OR PERITONEAL FLUID: LD, Fluid: 88 U/L — ABNORMAL HIGH (ref 3–23)

## 2019-01-12 LAB — PROCALCITONIN: Procalcitonin: <0.1 ng/mL

## 2019-01-12 LAB — GLUCOSE, PLEURAL OR PERITONEAL FLUID: Glucose, Fluid: 249 mg/dL

## 2019-01-12 LAB — SARS CORONAVIRUS 2 BY RT PCR (HOSPITAL ORDER, PERFORMED IN ~~LOC~~ HOSPITAL LAB): SARS Coronavirus 2: NEGATIVE

## 2019-01-12 LAB — IRON AND TIBC
Iron: 16 ug/dL — ABNORMAL LOW (ref 28–170)
Saturation Ratios: 4 % — ABNORMAL LOW (ref 10.4–31.8)
TIBC: 405 ug/dL (ref 250–450)
UIBC: 389 ug/dL

## 2019-01-12 LAB — FERRITIN: Ferritin: 11 ng/mL (ref 11–307)

## 2019-01-12 LAB — FOLATE: Folate: 13.1 ng/mL (ref >5.9)

## 2019-01-12 LAB — HIV ANTIBODY (ROUTINE TESTING W REFLEX): HIV Screen 4th Generation wRfx: NONREACTIVE

## 2019-01-12 LAB — VITAMIN B12: Vitamin B-12: 230 pg/mL (ref 180–914)

## 2019-01-12 LAB — C-REACTIVE PROTEIN: CRP: 1.6 mg/dL — ABNORMAL HIGH (ref <1.0)

## 2019-01-12 MED ORDER — LEVOCETIRIZINE DIHYDROCHLORIDE 5 MG PO TABS: 5.0000 mg | ORAL_TABLET | Freq: Every day | ORAL | Status: DC

## 2019-01-12 MED ORDER — OXYCODONE HCL 5 MG PO TABS
10.0000 mg | ORAL_TABLET | ORAL | Status: DC
  Administered 2019-01-12 – 2019-01-13 (×7): 10 mg via ORAL
  Filled 2019-01-12 (×7): qty 2

## 2019-01-12 MED ORDER — AMLODIPINE BESYLATE 2.5 MG PO TABS
5.0000 mg | ORAL_TABLET | Freq: Every day | ORAL | Status: DC
  Administered 2019-01-12 – 2019-01-13 (×2): 5 mg via ORAL
  Filled 2019-01-12 (×2): qty 2

## 2019-01-12 MED ORDER — NICOTINE 21 MG/24HR TD PT24
21.0000 mg | MEDICATED_PATCH | Freq: Every day | TRANSDERMAL | Status: DC
  Filled 2019-01-12: qty 1

## 2019-01-12 MED ORDER — ESTRADIOL 2 MG PO TABS
2.0000 mg | ORAL_TABLET | Freq: Every day | ORAL | Status: DC
  Administered 2019-01-12 – 2019-01-13 (×2): 2 mg via ORAL
  Filled 2019-01-12 (×2): qty 1

## 2019-01-12 MED ORDER — LIDOCAINE HCL 1 % IJ SOLN
INTRAMUSCULAR | Status: AC
  Filled 2019-01-12: qty 20

## 2019-01-12 MED ORDER — IOHEXOL 300 MG/ML  SOLN
125.0000 mL | Freq: Once | INTRAMUSCULAR | Status: AC | PRN
  Administered 2019-01-12: 125 mL via INTRAVENOUS

## 2019-01-12 MED ORDER — ALBUTEROL SULFATE (2.5 MG/3ML) 0.083% IN NEBU: 2.5000 mg | INHALATION_SOLUTION | RESPIRATORY_TRACT | Status: DC | PRN

## 2019-01-12 MED ORDER — ALBUTEROL SULFATE HFA 108 (90 BASE) MCG/ACT IN AERS: 2.0000 | INHALATION_SPRAY | RESPIRATORY_TRACT | Status: DC | PRN

## 2019-01-12 MED ORDER — ESCITALOPRAM OXALATE 10 MG PO TABS
20.0000 mg | ORAL_TABLET | Freq: Every day | ORAL | Status: DC
  Administered 2019-01-12 – 2019-01-13 (×2): 20 mg via ORAL
  Filled 2019-01-12 (×2): qty 2

## 2019-01-12 MED ORDER — SENNOSIDES-DOCUSATE SODIUM 8.6-50 MG PO TABS
1.0000 | ORAL_TABLET | Freq: Every day | ORAL | Status: DC
  Administered 2019-01-12: 1 via ORAL
  Filled 2019-01-12: qty 1

## 2019-01-12 MED ORDER — DM-GUAIFENESIN ER 30-600 MG PO TB12
1.0000 | ORAL_TABLET | Freq: Two times a day (BID) | ORAL | Status: DC | PRN
  Administered 2019-01-12: 07:00:00 1 via ORAL
  Filled 2019-01-12: qty 1

## 2019-01-12 MED ORDER — IPRATROPIUM-ALBUTEROL 0.5-2.5 (3) MG/3ML IN SOLN
3.0000 mL | RESPIRATORY_TRACT | Status: DC
  Administered 2019-01-12: 3 mL via RESPIRATORY_TRACT
  Filled 2019-01-12: qty 3

## 2019-01-12 MED ORDER — IPRATROPIUM BROMIDE 0.02 % IN SOLN: 0.5000 mg | RESPIRATORY_TRACT | Status: DC

## 2019-01-12 MED ORDER — BENAZEPRIL HCL 40 MG PO TABS
40.0000 mg | ORAL_TABLET | Freq: Every day | ORAL | Status: DC
  Administered 2019-01-12 – 2019-01-13 (×2): 40 mg via ORAL
  Filled 2019-01-12 (×2): qty 1

## 2019-01-12 MED ORDER — MOMETASONE FURO-FORMOTEROL FUM 200-5 MCG/ACT IN AERO
2.0000 | INHALATION_SPRAY | Freq: Two times a day (BID) | RESPIRATORY_TRACT | Status: DC
  Administered 2019-01-12 (×2): 2 via RESPIRATORY_TRACT
  Filled 2019-01-12: qty 8.8

## 2019-01-12 MED ORDER — SIMVASTATIN 20 MG PO TABS
20.0000 mg | ORAL_TABLET | Freq: Every day | ORAL | Status: DC
  Administered 2019-01-12: 20 mg via ORAL
  Filled 2019-01-12: qty 1

## 2019-01-12 MED ORDER — INSULIN ASPART 100 UNIT/ML ~~LOC~~ SOLN
0.0000 [IU] | Freq: Three times a day (TID) | SUBCUTANEOUS | Status: DC
  Administered 2019-01-12: 5 [IU] via SUBCUTANEOUS
  Administered 2019-01-12: 07:00:00 1 [IU] via SUBCUTANEOUS

## 2019-01-12 MED ORDER — PANTOPRAZOLE SODIUM 40 MG PO TBEC
40.0000 mg | DELAYED_RELEASE_TABLET | Freq: Two times a day (BID) | ORAL | Status: DC
  Administered 2019-01-12 – 2019-01-13 (×3): 40 mg via ORAL
  Filled 2019-01-12 (×3): qty 1

## 2019-01-12 MED ORDER — INSULIN ASPART 100 UNIT/ML ~~LOC~~ SOLN: 0.0000 [IU] | Freq: Every day | SUBCUTANEOUS | Status: DC

## 2019-01-12 MED ORDER — ACETAMINOPHEN 325 MG PO TABS: 650.0000 mg | ORAL_TABLET | Freq: Four times a day (QID) | ORAL | Status: DC | PRN

## 2019-01-12 MED ORDER — LORATADINE 10 MG PO TABS
10.0000 mg | ORAL_TABLET | Freq: Every day | ORAL | Status: DC
  Administered 2019-01-12: 21:00:00 10 mg via ORAL
  Filled 2019-01-12: qty 1

## 2019-01-12 MED ORDER — ALPRAZOLAM 0.5 MG PO TABS
2.0000 mg | ORAL_TABLET | Freq: Every day | ORAL | Status: DC
  Administered 2019-01-12: 2 mg via ORAL
  Filled 2019-01-12: qty 4

## 2019-01-12 MED ORDER — LIDOCAINE HCL 1 % IJ SOLN
INTRAMUSCULAR | Status: AC | PRN
  Administered 2019-01-12: 10 mL

## 2019-01-12 MED ORDER — OXYCODONE HCL 5 MG PO TABS
5.0000 mg | ORAL_TABLET | Freq: Four times a day (QID) | ORAL | Status: DC | PRN
  Administered 2019-01-12: 03:00:00 5 mg via ORAL
  Filled 2019-01-12: qty 1

## 2019-01-12 MED ORDER — ONDANSETRON HCL 4 MG/2ML IJ SOLN
4.0000 mg | Freq: Three times a day (TID) | INTRAMUSCULAR | Status: DC | PRN
  Administered 2019-01-12: 4 mg via INTRAVENOUS
  Filled 2019-01-12 (×2): qty 2

## 2019-01-12 MED ORDER — POLYETHYLENE GLYCOL 3350 17 G PO PACK
17.0000 g | PACK | Freq: Every day | ORAL | Status: DC | PRN
  Administered 2019-01-13: 17 g via ORAL
  Filled 2019-01-12: qty 1

## 2019-01-12 MED ORDER — IPRATROPIUM-ALBUTEROL 0.5-2.5 (3) MG/3ML IN SOLN
3.0000 mL | Freq: Three times a day (TID) | RESPIRATORY_TRACT | Status: DC
  Administered 2019-01-12 (×2): 3 mL via RESPIRATORY_TRACT
  Filled 2019-01-12 (×4): qty 3

## 2019-01-12 MED ORDER — HYDRALAZINE HCL 20 MG/ML IJ SOLN: 5.0000 mg | INTRAMUSCULAR | Status: DC | PRN

## 2019-01-12 MED ORDER — TORSEMIDE 20 MG PO TABS
20.0000 mg | ORAL_TABLET | Freq: Every day | ORAL | Status: DC
  Administered 2019-01-12 – 2019-01-13 (×2): 20 mg via ORAL
  Filled 2019-01-12 (×2): qty 1

## 2019-01-12 MED ORDER — AZITHROMYCIN 500 MG PO TABS
500.0000 mg | ORAL_TABLET | Freq: Every day | ORAL | Status: AC
  Administered 2019-01-12: 500 mg via ORAL
  Filled 2019-01-12: qty 1

## 2019-01-12 MED ORDER — ALPRAZOLAM 0.5 MG PO TABS
1.0000 mg | ORAL_TABLET | Freq: Every day | ORAL | Status: DC
  Administered 2019-01-12 – 2019-01-13 (×2): 1 mg via ORAL
  Filled 2019-01-12 (×2): qty 2

## 2019-01-12 MED ORDER — CYCLOBENZAPRINE HCL 10 MG PO TABS
10.0000 mg | ORAL_TABLET | Freq: Three times a day (TID) | ORAL | Status: DC | PRN
  Administered 2019-01-12 (×2): 10 mg via ORAL
  Filled 2019-01-12 (×3): qty 1

## 2019-01-12 MED ORDER — METHYLPREDNISOLONE SODIUM SUCC 125 MG IJ SOLR
60.0000 mg | Freq: Two times a day (BID) | INTRAMUSCULAR | Status: DC
  Administered 2019-01-12: 60 mg via INTRAVENOUS
  Filled 2019-01-12: qty 2

## 2019-01-12 MED ORDER — AZITHROMYCIN 250 MG PO TABS
250.0000 mg | ORAL_TABLET | Freq: Every day | ORAL | Status: DC
  Administered 2019-01-13: 250 mg via ORAL
  Filled 2019-01-12: qty 1
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
## 2019-01-12 NOTE — Progress Notes (Signed)
PROGRESS NOTE    [name]  JIR:[id number] DOB: June 23, 1961 DOA: 01/11/2019 PCP: Shirline Frees, MD    Brief Narrative:  58 year old female with history of hypertension, hyperlipidemia, diabetes, COPD, GERD depression anxiety, nonalcoholic liver cirrhosis and obstructive sleep apnea presenting to the hospital with shortness of breath and abdominal pain.  Patient does have chronic pain issues and she is on long-term narcotics.  Patient stated that for last few days she has more lower abdominal pain associated shortness of breath.  In the emergency room she was found wheezing as well as ascites.   Assessment & Plan:   Principal Problem:   COPD exacerbation ([place]) Active Problems:   Tobacco abuse   Hypertension   Acute on chronic respiratory failure with hypoxia ([name])   HLD (hyperlipidemia)   Diabetes mellitus without complication ([name])   Microcytic anemia   Depression with anxiety   OSA (obstructive sleep apnea)   Abdominal pain   Suspected Covid-19 Virus Infection  Acute on chronic respiratory failure with hypoxia due to COPD exacerbation: Patient with fair improvement today.  She is on a scheduled nebulizers.  On tapering dose of steroids.  On azithromycin.  Incentive spirometry.  COVID-19 negative.  Respiratory panel negative and influenza negative.  Given oxygen to keep saturation more than 90%.  Patient probably has worsening shortness of breath because of abdominal ascites.  Chronic liver disease/ascites with abdominal pain: CT scan abdomen pelvis shows increasing abdominal ascites with cirrhosis.  Liver function tests are normal.  Procalcitonin is 0.1.  No fever or leukocytosis.  Ordered for paracentesis.  Will send for labs including cell count and cultures.  Rule out SBP.  Smoker: Counseled to quit.  On nicotine patch.  Type 2 diabetes: Known A1c 5.4.  On metformin at home.  Currently on sliding scale insulin.  Hypertension: Fairly stable.  On amlodipine and Lotensin.   Will restart torsemide that she uses at home.  Obstructive sleep apnea on CPAP: Patient is noncompliant and not using at home.  I advised her to follow-up with her sleep physician and reconsider reordering CPAP.  This patient has significant abdominal pain, shortness of breath requiring frequent nebulizers.  She is also requiring paracentesis and investigations for her worsening abdominal pain.  Patient will require inpatient management in the hospital.   DVT prophylaxis: SCDs Code Status: Full code Family Communication: None Disposition Plan: Inpatient.  Anticipate discharge home tomorrow if remains a stable.   Consultants:   None  Procedures:   Paracentesis ordered  Antimicrobials:   Azithromycin, 01/11/2019   Subjective: Patient was seen and examined in the morning.  She was anxious with abdominal pain.  On medication reconciliation she was given lower dose of oxycodone and she was not happy. Remains afebrile. She is known to have liver cirrhosis and follows up with [facility].  Quite compensated other than ascites.  Patient never had any complications from it.  Objective: Vitals:   01/12/19 0318 01/12/19 0441 01/12/19 0916 01/12/19 1208  BP:   (!) 141/75 (!) 103/52  Pulse:   77 84  Resp:    19  Temp:   98.1 F (36.7 C) 98.3 F (36.8 C)  TempSrc:   Oral Oral  SpO2:  96% 99% 90%  Weight: 103.1 kg     Height:        Intake/Output Summary (Last 24 hours) at 01/12/2019 1322 Last data filed at 01/12/2019 1229 Gross per 24 hour  Intake 582 ml  Output 1900 ml  Net -1318  ml   Filed Weights   01/11/19 1925 01/12/19 0318  Weight: 104.3 kg 103.1 kg    Examination:  General exam: Appears calm and comfortable, currently on room air. Respiratory system: Clear to auscultation. Respiratory effort normal. Cardiovascular system: S1 & S2 heard, RRR. No JVD, murmurs, rubs, gallops or clicks. No pedal edema. Gastrointestinal system: Abdomen is distended, soft and nontender.   Previous surgical scars are healthy.  No organomegaly or masses felt. Normal bowel sounds heard. Central nervous system: Alert and oriented. No focal neurological deficits. Extremities: Symmetric 5 x 5 power. Skin: No rashes, lesions or ulcers Psychiatry: Judgement and insight appear normal. Mood & affect appropriate.     Data Reviewed: I have personally reviewed following labs and imaging studies  CBC: Recent Labs  Lab 01/11/19 1951 01/12/19 0040  WBC 6.7 6.2  NEUTROABS 4.1 3.6  HGB 8.0* 8.1*  HCT 28.7* 29.4*  MCV 77.8* 78.6*  PLT 197 811   Basic Metabolic Panel: Recent Labs  Lab 01/11/19 1951  NA 138  K 3.8  CL 99  CO2 28  GLUCOSE 110*  BUN 6  CREATININE 0.79  CALCIUM 8.4*   GFR: Estimated Creatinine Clearance: 85.2 mL/min (by C-G formula based on SCr of 0.79 mg/dL). Liver Function Tests: Recent Labs  Lab 01/12/19 0040  AST 21  ALT 10  ALKPHOS 69  BILITOT 0.6  PROT 7.3  ALBUMIN 3.0*   Recent Labs  Lab 01/12/19 0040  LIPASE 29   Recent Labs  Lab 01/12/19 0608  AMMONIA 29   Coagulation Profile: Recent Labs  Lab 01/12/19 0146  INR 1.2   Cardiac Enzymes: No results for input(s): CKTOTAL, CKMB, CKMBINDEX, TROPONINI in the last 168 hours. BNP (last 3 results) No results for input(s): PROBNP in the last 8760 hours. HbA1C: No results for input(s): HGBA1C in the last 72 hours. CBG: Recent Labs  Lab 01/12/19 0555 01/12/19 1205  GLUCAP 139* 297*   Lipid Profile: No results for input(s): CHOL, HDL, LDLCALC, TRIG, CHOLHDL, LDLDIRECT in the last 72 hours. Thyroid Function Tests: No results for input(s): TSH, T4TOTAL, FREET4, T3FREE, THYROIDAB in the last 72 hours. Anemia Panel: Recent Labs    01/12/19 0040  VITAMINB12 230  FOLATE 13.1  FERRITIN 11  TIBC 405  IRON 16*  RETICCTPCT 2.0   Sepsis Labs: Recent Labs  Lab 01/12/19 0040  PROCALCITON <0.10    Recent Results (from the past 240 hour(s))  Respiratory Panel by PCR     Status:  None   Collection Time: 01/12/19  1:25 AM  Result Value Ref Range Status   Adenovirus NOT DETECTED NOT DETECTED Final   Coronavirus 229E NOT DETECTED NOT DETECTED Final    Comment: (NOTE) The Coronavirus on the Respiratory Panel, DOES NOT test for the novel  Coronavirus (2019 nCoV)    Coronavirus HKU1 NOT DETECTED NOT DETECTED Final   Coronavirus NL63 NOT DETECTED NOT DETECTED Final   Coronavirus OC43 NOT DETECTED NOT DETECTED Final   Metapneumovirus NOT DETECTED NOT DETECTED Final   Rhinovirus / Enterovirus NOT DETECTED NOT DETECTED Final   Influenza A NOT DETECTED NOT DETECTED Final   Influenza B NOT DETECTED NOT DETECTED Final   Parainfluenza Virus 1 NOT DETECTED NOT DETECTED Final   Parainfluenza Virus 2 NOT DETECTED NOT DETECTED Final   Parainfluenza Virus 3 NOT DETECTED NOT DETECTED Final   Parainfluenza Virus 4 NOT DETECTED NOT DETECTED Final   Respiratory Syncytial Virus NOT DETECTED NOT DETECTED Final   Bordetella pertussis  NOT DETECTED NOT DETECTED Final   Chlamydophila pneumoniae NOT DETECTED NOT DETECTED Final   Mycoplasma pneumoniae NOT DETECTED NOT DETECTED Final    Comment: Performed at [facility], [place] [address], [place], [place] [address]  SARS Coronavirus 2 [facility] order, Performed in [facility] hospital lab)     Status: None   Collection Time: 01/12/19  1:25 AM  Result Value Ref Range Status   SARS Coronavirus 2 NEGATIVE NEGATIVE Final    Comment: (NOTE) If result is NEGATIVE SARS-CoV-2 target nucleic acids are NOT DETECTED. The SARS-CoV-2 RNA is generally detectable in upper and lower  respiratory specimens during the acute phase of infection. The lowest  concentration of SARS-CoV-2 viral copies this assay can detect is 250  copies / mL. A negative result does not preclude SARS-CoV-2 infection  and should not be used as the sole basis for treatment or other  patient management decisions.  A negative result may occur with  improper specimen  collection / handling, submission of specimen other  than nasopharyngeal swab, presence of viral mutation(s) within the  areas targeted by this assay, and inadequate number of viral copies  (<250 copies / mL). A negative result must be combined with clinical  observations, patient history, and epidemiological information. If result is POSITIVE SARS-CoV-2 target nucleic acids are DETECTED. The SARS-CoV-2 RNA is generally detectable in upper and lower  respiratory specimens dur ing the acute phase of infection.  Positive  results are indicative of active infection with SARS-CoV-2.  Clinical  correlation with patient history and other diagnostic information is  necessary to determine patient infection status.  Positive results do  not rule out bacterial infection or co-infection with other viruses. If result is PRESUMPTIVE POSTIVE SARS-CoV-2 nucleic acids MAY BE PRESENT.   A presumptive positive result was obtained on the submitted specimen  and confirmed on repeat testing.  While 2019 novel coronavirus  (SARS-CoV-2) nucleic acids may be present in the submitted sample  additional confirmatory testing may be necessary for epidemiological  and / or clinical management purposes  to differentiate between  SARS-CoV-2 and other Sarbecovirus currently known to infect humans.  If clinically indicated additional testing with an alternate test  methodology [phone]) is advised. The SARS-CoV-2 RNA is generally  detectable in upper and lower respiratory sp ecimens during the acute  phase of infection. The expected result is Negative. Fact Sheet for Patients:  StrictlyIdeas.no Fact Sheet for Healthcare Providers: BankingDealers.co.za This test is not yet approved or cleared by the [place] FDA and has been authorized for detection and/or diagnosis of SARS-CoV-2 by FDA under an Emergency Use Authorization (EUA).  This EUA will remain in effect  (meaning this test can be used) for the duration of the COVID-19 declaration under Section 564(b)(1) of the Act, 21 U.S.C. section 360bbb-3(b)(1), unless the authorization is terminated or revoked sooner. Performed at [facility], [place] [address] [place], [place] [address]          Radiology Studies: Ct Abdomen Pelvis W Contrast  Result Date: 01/12/2019 CLINICAL DATA:  Abdominal pain, unspecified.  Cirrhosis. EXAM: CT ABDOMEN AND PELVIS WITH CONTRAST TECHNIQUE: Multidetector CT imaging of the abdomen and pelvis was performed using the standard protocol following bolus administration of intravenous contrast. CONTRAST:  1108m OMNIPAQUE IOHEXOL 300 MG/ML  SOLN COMPARISON:  CT of the abdomen and pelvis 10/18/2018 FINDINGS: Lower chest: Mild dependent atelectasis is present bilaterally. The heart is enlarged. No significant pleural or pericardial effusion is present.  Hepatobiliary: Chronic changes of cirrhosis are again seen. No focal lesions are evident. The gallbladder is dilated. Common bile duct is within normal limits. Pancreas: Unremarkable. No pancreatic ductal dilatation or surrounding inflammatory changes. Spleen: Splenic cysts are stable. No new lesions are present. The spleen is mildly enlarged, stable. Adrenals/Urinary Tract: Adrenal glands are unremarkable. Kidneys are normal, without renal calculi, focal lesion, or hydronephrosis. Bladder is unremarkable. Stomach/Bowel: Stomach and duodenum are within normal limits. Small bowel is unremarkable. There is no obstruction or mass lesion. Ascending and transverse colon are within normal limits. The descending and sigmoid colon are normal. Vascular/Lymphatic: Atherosclerotic calcifications are present in the aorta and distal small vessels. Mesenteric and periportal adenopathy is typical of cirrhosis. Portal venous hypertension is noted. No significant soft tissue varices are present. Reproductive: Status post hysterectomy. No adnexal  masses. Other: Increased diffuse ascites are noted. No significant ventral hernia is present. Musculoskeletal: Bilateral L5 pars defects and grade 1 anterolisthesis are again noted at L5-S1. Mild degenerative changes are noted otherwise. No focal lytic or blastic lesions are present. Pelvis intact. Hips are located and normal. IMPRESSION: 1. Increased abdominal ascites associated with cirrhosis. 2. Other chronic findings of cirrhosis are stable. 3. No other acute intra-abdominal process to explain the patient's abdominal pain. No obstruction. Electronically Signed   By: San Morelle M.D.   On: 01/12/2019 05:06   Dg Abd Acute 2+v W 1v Chest  Result Date: 01/11/2019 CLINICAL DATA:  Abdominal pain and nausea for 3 days. Shortness of breath and hypoxia. EXAM: DG ABDOMEN ACUTE W/ 1V CHEST COMPARISON:  10/22/2018 and 10/15/2018 FINDINGS: There is no evidence of dilated bowel loops or free intraperitoneal air. No radiopaque calculi or other significant radiographic abnormality is seen. Heart size and mediastinal contours are within normal limits. Both lungs are clear. IMPRESSION: Unremarkable bowel gas pattern.  No active cardiopulmonary disease. Electronically Signed   By: Earle Gell M.D.   On: 01/11/2019 20:22        Scheduled Meds: . ALPRAZolam  1 mg Oral Daily  . ALPRAZolam  2 mg Oral QHS  . amLODipine  5 mg Oral Daily  . [START ON 01/13/2019] azithromycin  250 mg Oral Daily  . benazepril  40 mg Oral Daily  . escitalopram  20 mg Oral Daily  . estradiol  2 mg Oral Daily  . insulin aspart  0-5 Units Subcutaneous QHS  . insulin aspart  0-9 Units Subcutaneous TID WC  . ipratropium-albuterol  3 mL Nebulization TID  . loratadine  10 mg Oral QHS  . mometasone-formoterol  2 puff Inhalation BID  . nicotine  21 mg Transdermal Daily  . oxyCODONE  10 mg Oral Q4H  . pantoprazole  40 mg Oral BID  . senna-docusate  1 tablet Oral QHS  . simvastatin  20 mg Oral QHS  . torsemide  20 mg Oral Daily    Continuous Infusions:   LOS: 0 days    Time spent: 25 minutes    Barb Merino, MD [facility] Pager [phone]  If 7PM-7AM, please contact night-coverage [email or web address] Password [email or web address] 01/12/2019, 1:22 PM

## 2019-01-12 NOTE — ED Notes (Signed)
Spoke with Dr. Blaine Hamper about negative COVID results for adjustment on bed request

## 2019-01-12 NOTE — ED Notes (Signed)
Paged Blaine Hamper to [employer]

## 2019-01-12 NOTE — Plan of Care (Signed)
  Problem: Clinical Measurements: Goal: Will remain free from infection Outcome: Progressing   Problem: Safety: Goal: Ability to remain free from injury will improve Outcome: Progressing   

## 2019-01-12 NOTE — Progress Notes (Signed)
Inpatient Diabetes Program Recommendations  AACE/ADA: New Consensus Statement on Inpatient Glycemic Control (2015)  Target Ranges:  Prepandial:   less than 140 mg/dL      Peak postprandial:   less than 180 mg/dL (1-2 hours)      Critically ill patients:  140 - 180 mg/dL   Results for [name], [name] (MRN [id number]) as of 01/12/2019 13:05  Ref. Range 01/12/2019 05:55 01/12/2019 12:05  Glucose-Capillary Latest Ref Range: 70 - 99 mg/dL 139 (H) 297 (H)    Review of Glycemic Control  Diabetes history: DM 2 Outpatient Diabetes medications: Metformin 500 mg BID Current orders for Inpatient glycemic control: Novolog 0-9 units tid, Novolog 0-5 units qhs  Inpatient Diabetes Program Recommendations:    Patient received one dose of IV Solumedrol 60 mg and one dose of PO prednisone 60 mg. Glucose 297 mg/dl at lunchtime. Renal function looks good.   Consider increasing Novolog to Moderate 0-15 units tid. Glucose trends should start to decrease as steroids are no longer ordered.  Thanks,  Tama Headings RN, MSN, BC-ADM Inpatient Diabetes Coordinator Team Pager [phone] (8a-5p)

## 2019-01-12 NOTE — Procedures (Signed)
PROCEDURE SUMMARY:  Successful US guided paracentesis from right lateral abdomen.  Yielded 4.3 liters of yelow fluid.  No immediate complications.  Pt tolerated well.   Specimen was sent for labs.  EBL < 58mL  Docia Barrier PA-C 01/12/2019 3:46 PM
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
## 2019-01-13 LAB — GLUCOSE, CAPILLARY
Glucose-Capillary: 138 mg/dL — ABNORMAL HIGH (ref 70–99)
Glucose-Capillary: 104 mg/dL — ABNORMAL HIGH (ref 70–99)

## 2019-01-13 NOTE — Discharge Summary (Signed)
Physician Discharge Summary  [name] LNL:[id number] DOB: [birth year] DOA: 01/11/2019  PCP: Shirline Frees, MD  Admit date: 01/11/2019 Discharge date: 01/13/2019  Admitted From: home  Disposition:  Home   Recommendations for Outpatient Follow-up:  1. Follow up with PCP in 1-2 weeks 2. Please follow-up with your sleep medicine physician and GI doctor.  You need to go back on CPAP.  Home Health: None Equipment/Devices: None  Discharge Condition: Stable CODE STATUS: Full code Diet recommendation: Cardiac diet  Brief/Interim Summary: Patient is a 58 year old female with medical history of hypertension, hyperlipidemia, type 2 diabetes on metformin, COPD, intermittent smoker, GERD.  Patient also has a history of nonalcoholic liver cirrhosis.  She has sleep apnea but noncompliant to CPAP.  Patient came to the emergency room with abdominal pain, acute on chronic worse on the lower abdomen.  Discharge Diagnoses:  Principal Problem:   COPD exacerbation ([place]) Active Problems:   Tobacco abuse   Hypertension   Acute on chronic respiratory failure with hypoxia ([name])   HLD (hyperlipidemia)   Diabetes mellitus without complication ([name])   Microcytic anemia   Depression with anxiety   OSA (obstructive sleep apnea)   Abdominal pain   Suspected Covid-19 Virus Infection  Acute on chronic abdominal pain: Patient does have chronic abdominal pain and takes oxycodone as prescribed by her pain management specialist.  She has history of multiple abdominal surgeries.  A CT scan of the abdomen pelvis was normal except moderate amount of ascites fluid.  Paracentesis was done that showed a transudative fluid with no evidence of infection.  Her symptoms improved after paracentesis of about 4 L of fluid.  Negative cultures. Liver function tests are normal.  Renal function test are normal.  Ammonia was normal.  INR was normal.  Patient probably has chronic slow accumulation of ascites from her liver failure.   She was intermittently taking torsemide, she will take torsemide every day.  Patient is asymptomatic today, she is going home, resuming her oxycodone.  She will take torsemide.  She will follow-up with her GI doctor for further recommendation.  Shortness of breath: Mostly related to above.  Her chest x-ray was normal.  She had some evidence of wheezing on presentation treated with bronchodilator and steroids.  After paracentesis her symptoms have improved.  There is no indication to continue steroids or antibiotics.  Patient has obesity, sleep apnea.  She is noncompliant to CPAP.  I have suggested her to go back to her sleep medicine specialist and reevaluated per sleep apnea.  Patient has chronic anemia.  Baseline hemoglobin about 8.  May need upper GI evaluation with gastroenterology.  Patient is fairly stable today.  She is on room air.  She is going home with outpatient follow-up.    Discharge Instructions  Discharge Instructions    Call MD for:  difficulty breathing, headache or visual disturbances   Complete by:  As directed    Call MD for:  severe uncontrolled pain   Complete by:  As directed    Diet - low sodium heart healthy   Complete by:  As directed    Discharge instructions   Complete by:  As directed    Take torsemide 20 mg every day Follow up with your sleep medicine doctor and GI physician   Increase activity slowly   Complete by:  As directed      Allergies as of 01/13/2019      Reactions   Amoxicillin Nausea And Vomiting   tolerated augmentin  in the past (03/2013) without issue.   Clindamycin/lincomycin Nausea And Vomiting   Doxycycline Nausea And Vomiting   Treximet [sumatriptan-naproxen Sodium] Nausea And Vomiting   Bactrim Hives, Rash      Medication List    STOP taking these medications   aspirin EC 81 MG tablet   magnesium gluconate 30 MG tablet Commonly known as:  MAGONATE   predniSONE 10 MG tablet Commonly known as:  DELTASONE     TAKE these  medications   acetaminophen 325 MG tablet Commonly known as:  TYLENOL Take 2 tablets (650 mg total) by mouth every 6 (six) hours as needed for headache.   albuterol 108 (90 Base) MCG/ACT inhaler Commonly known as:  VENTOLIN HFA Inhale 2 puffs into the lungs every 4 (four) hours as needed for wheezing or shortness of breath.   ALPRAZolam 1 MG tablet Commonly known as:  XANAX Take 1-2 mg by mouth See admin instructions. Take 1 mg in the morning and 2 mg at bedtime.   amLODipine 5 MG tablet Commonly known as:  NORVASC Take 5 mg by mouth daily.   benazepril 40 MG tablet Commonly known as:  LOTENSIN Take 40 mg by mouth daily.   budesonide-formoterol 160-4.5 MCG/ACT inhaler Commonly known as:  SYMBICORT Inhale 2 puffs into the lungs 2 (two) times daily as needed (SOB).   cyclobenzaprine 10 MG tablet Commonly known as:  FLEXERIL Take 10 mg by mouth 3 (three) times daily as needed for muscle spasms.   escitalopram 20 MG tablet Commonly known as:  LEXAPRO Take 20 mg by mouth daily.   estradiol 2 MG tablet Commonly known as:  ESTRACE Take 2 mg by mouth daily.   ipratropium-albuterol 0.5-2.5 (3) MG/3ML Soln Commonly known as:  DUONEB Take 3 mLs by nebulization every 6 (six) hours as needed (Shortness of breath).   levocetirizine 5 MG tablet Commonly known as:  XYZAL Take 5 mg by mouth at bedtime.   meloxicam 15 MG tablet Commonly known as:  MOBIC Take 15 mg by mouth daily as needed for pain.   metFORMIN 500 MG tablet Commonly known as:  GLUCOPHAGE Take 2 tablets (1,000 mg total) by mouth 2 (two) times daily with a meal. What changed:  how much to take   ondansetron 8 MG tablet Commonly known as:  ZOFRAN Take 8 mg by mouth every 8 (eight) hours as needed for nausea or vomiting.   Oxycodone HCl 10 MG Tabs Take 10 mg by mouth every 4 (four) hours.   pantoprazole 40 MG tablet Commonly known as:  PROTONIX Take 40 mg by mouth 2 (two) times daily.   polyethylene glycol  17 g packet Commonly known as:  MIRALAX / GLYCOLAX Take 17 g by mouth 2 (two) times daily. What changed:    when to take this  reasons to take this   senna-docusate 8.6-50 MG tablet Commonly known as:  Senokot S Take 1 tablet by mouth at bedtime.   simvastatin 20 MG tablet Commonly known as:  ZOCOR Take 20 mg by mouth at bedtime.   torsemide 20 MG tablet Commonly known as:  DEMADEX Take 20 mg by mouth daily as needed for fluid.       Allergies  Allergen Reactions  . Amoxicillin Nausea And Vomiting    tolerated augmentin in the past (03/2013) without issue.  . Clindamycin/Lincomycin Nausea And Vomiting  . Doxycycline Nausea And Vomiting  . Treximet [Sumatriptan-Naproxen Sodium] Nausea And Vomiting  . Bactrim Hives and Rash  Consultations:  None.   Procedures/Studies: Ct Abdomen Pelvis W Contrast  Result Date: 01/12/2019 CLINICAL DATA:  Abdominal pain, unspecified.  Cirrhosis. EXAM: CT ABDOMEN AND PELVIS WITH CONTRAST TECHNIQUE: Multidetector CT imaging of the abdomen and pelvis was performed using the standard protocol following bolus administration of intravenous contrast. CONTRAST:  1110m OMNIPAQUE IOHEXOL 300 MG/ML  SOLN COMPARISON:  CT of the abdomen and pelvis 10/18/2018 FINDINGS: Lower chest: Mild dependent atelectasis is present bilaterally. The heart is enlarged. No significant pleural or pericardial effusion is present. Hepatobiliary: Chronic changes of cirrhosis are again seen. No focal lesions are evident. The gallbladder is dilated. Common bile duct is within normal limits. Pancreas: Unremarkable. No pancreatic ductal dilatation or surrounding inflammatory changes. Spleen: Splenic cysts are stable. No new lesions are present. The spleen is mildly enlarged, stable. Adrenals/Urinary Tract: Adrenal glands are unremarkable. Kidneys are normal, without renal calculi, focal lesion, or hydronephrosis. Bladder is unremarkable. Stomach/Bowel: Stomach and duodenum are  within normal limits. Small bowel is unremarkable. There is no obstruction or mass lesion. Ascending and transverse colon are within normal limits. The descending and sigmoid colon are normal. Vascular/Lymphatic: Atherosclerotic calcifications are present in the aorta and distal small vessels. Mesenteric and periportal adenopathy is typical of cirrhosis. Portal venous hypertension is noted. No significant soft tissue varices are present. Reproductive: Status post hysterectomy. No adnexal masses. Other: Increased diffuse ascites are noted. No significant ventral hernia is present. Musculoskeletal: Bilateral L5 pars defects and grade 1 anterolisthesis are again noted at L5-S1. Mild degenerative changes are noted otherwise. No focal lytic or blastic lesions are present. Pelvis intact. Hips are located and normal. IMPRESSION: 1. Increased abdominal ascites associated with cirrhosis. 2. Other chronic findings of cirrhosis are stable. 3. No other acute intra-abdominal process to explain the patient's abdominal pain. No obstruction. Electronically Signed   By: CSan MorelleM.D.   On: 01/12/2019 05:06   Dg Abd Acute 2+v W 1v Chest  Result Date: 01/11/2019 CLINICAL DATA:  Abdominal pain and nausea for 3 days. Shortness of breath and hypoxia. EXAM: DG ABDOMEN ACUTE W/ 1V CHEST COMPARISON:  10/22/2018 and 10/15/2018 FINDINGS: There is no evidence of dilated bowel loops or free intraperitoneal air. No radiopaque calculi or other significant radiographic abnormality is seen. Heart size and mediastinal contours are within normal limits. Both lungs are clear. IMPRESSION: Unremarkable bowel gas pattern.  No active cardiopulmonary disease. Electronically Signed   By: JEarle GellM.D.   On: 01/11/2019 20:22   Ir Paracentesis  Result Date: 01/12/2019 INDICATION: Patient with history of non alcoholic cirrhosis, now with abdominal distension. Request is made for diagnostic and therapeutic paracentesis. EXAM: ULTRASOUND  GUIDED DIAGNOSTIC AND THERAPEUTIC PARACENTESIS MEDICATIONS: 10 mL 1% lidocaine COMPLICATIONS: None immediate. PROCEDURE: Informed written consent was obtained from the patient after a discussion of the risks, benefits and alternatives to treatment. A timeout was performed prior to the initiation of the procedure. Initial ultrasound scanning demonstrates a large amount of ascites within the right lower abdominal quadrant. The right lower abdomen was prepped and draped in the usual sterile fashion. 1% lidocaine was used for local anesthesia. Following this, a 19 gauge, 7-cm, Yueh catheter was introduced. An ultrasound image was saved for documentation purposes. The paracentesis was performed. The catheter was removed and a dressing was applied. The patient tolerated the procedure well without immediate post procedural complication. FINDINGS: A total of approximately 4.3 liters of yellow fluid was removed. Samples were sent to the laboratory as requested by the clinical team.  IMPRESSION: Successful ultrasound-guided diagnostic and therapeutic paracentesis yielding 4.3 liters of peritoneal fluid. Read by: Brynda Greathouse PA-C Electronically Signed   By: Markus Daft M.D.   On: 01/12/2019 15:52    Paracentesis, 4 L transudate   Subjective: Patient seen and examined on the day of discharge.  Denies any shortness of breath.  She was on room air.  Her abdomen pain is better today.  Has normal urine and bowel movements.   Discharge Exam: Vitals:   01/13/19 0034 01/13/19 0434  BP: 114/64 (!) 112/59  Pulse: 85 85  Resp: 16 20  Temp: 97.8 F (36.6 C) 97.7 F (36.5 C)  SpO2: 94% 94%   Vitals:   01/12/19 2118 01/13/19 0034 01/13/19 0434 01/13/19 0436  BP:  114/64 (!) 112/59   Pulse:  85 85   Resp:  16 20   Temp:  97.8 F (36.6 C) 97.7 F (36.5 C)   TempSrc:  Oral Oral   SpO2: 95% 94% 94%   Weight:    97.8 kg  Height:        General: Pt is alert, awake, not in acute distress Cardiovascular: RRR,  S1/S2 +, no rubs, no gallops Respiratory: CTA bilaterally, no wheezing, no rhonchi Abdominal: Soft, NT, ND, bowel sounds +, previous surgical scars are nontender and healthy.  Slightly distended but nontender.  Obese and pendulous. Extremities: no edema, no cyanosis    The results of significant diagnostics from this hospitalization (including imaging, microbiology, ancillary and laboratory) are listed below for reference.     Microbiology: Recent Results (from the past 240 hour(s))  Respiratory Panel by PCR     Status: None   Collection Time: 01/12/19  1:25 AM  Result Value Ref Range Status   Adenovirus NOT DETECTED NOT DETECTED Final   Coronavirus 229E NOT DETECTED NOT DETECTED Final    Comment: (NOTE) The Coronavirus on the Respiratory Panel, DOES NOT test for the novel  Coronavirus (2019 nCoV)    Coronavirus HKU1 NOT DETECTED NOT DETECTED Final   Coronavirus NL63 NOT DETECTED NOT DETECTED Final   Coronavirus OC43 NOT DETECTED NOT DETECTED Final   Metapneumovirus NOT DETECTED NOT DETECTED Final   Rhinovirus / Enterovirus NOT DETECTED NOT DETECTED Final   Influenza A NOT DETECTED NOT DETECTED Final   Influenza B NOT DETECTED NOT DETECTED Final   Parainfluenza Virus 1 NOT DETECTED NOT DETECTED Final   Parainfluenza Virus 2 NOT DETECTED NOT DETECTED Final   Parainfluenza Virus 3 NOT DETECTED NOT DETECTED Final   Parainfluenza Virus 4 NOT DETECTED NOT DETECTED Final   Respiratory Syncytial Virus NOT DETECTED NOT DETECTED Final   Bordetella pertussis NOT DETECTED NOT DETECTED Final   Chlamydophila pneumoniae NOT DETECTED NOT DETECTED Final   Mycoplasma pneumoniae NOT DETECTED NOT DETECTED Final    Comment: Performed at [facility], [address] [place], [place] [address]  SARS Coronavirus 2 [facility] order, Performed in [facility] hospital lab)     Status: None   Collection Time: 01/12/19  1:25 AM  Result Value Ref Range Status   SARS Coronavirus 2 NEGATIVE NEGATIVE  Final    Comment: (NOTE) If result is NEGATIVE SARS-CoV-2 target nucleic acids are NOT DETECTED. The SARS-CoV-2 RNA is generally detectable in upper and lower  respiratory specimens during the acute phase of infection. The lowest  concentration of SARS-CoV-2 viral copies this assay can detect is 250  copies / mL. A negative result does not preclude SARS-CoV-2 infection  and should  not be used as the sole basis for treatment or other  patient management decisions.  A negative result may occur with  improper specimen collection / handling, submission of specimen other  than nasopharyngeal swab, presence of viral mutation(s) within the  areas targeted by this assay, and inadequate number of viral copies  (<250 copies / mL). A negative result must be combined with clinical  observations, patient history, and epidemiological information. If result is POSITIVE SARS-CoV-2 target nucleic acids are DETECTED. The SARS-CoV-2 RNA is generally detectable in upper and lower  respiratory specimens dur ing the acute phase of infection.  Positive  results are indicative of active infection with SARS-CoV-2.  Clinical  correlation with patient history and other diagnostic information is  necessary to determine patient infection status.  Positive results do  not rule out bacterial infection or co-infection with other viruses. If result is PRESUMPTIVE POSTIVE SARS-CoV-2 nucleic acids MAY BE PRESENT.   A presumptive positive result was obtained on the submitted specimen  and confirmed on repeat testing.  While 2019 novel coronavirus  (SARS-CoV-2) nucleic acids may be present in the submitted sample  additional confirmatory testing may be necessary for epidemiological  and / or clinical management purposes  to differentiate between  SARS-CoV-2 and other Sarbecovirus currently known to infect humans.  If clinically indicated additional testing with an alternate test  methodology [phone]) is advised. The  SARS-CoV-2 RNA is generally  detectable in upper and lower respiratory sp ecimens during the acute  phase of infection. The expected result is Negative. Fact Sheet for Patients:  StrictlyIdeas.no Fact Sheet for Healthcare Providers: BankingDealers.co.za This test is not yet approved or cleared by the [place] FDA and has been authorized for detection and/or diagnosis of SARS-CoV-2 by FDA under an Emergency Use Authorization (EUA).  This EUA will remain in effect (meaning this test can be used) for the duration of the COVID-19 declaration under Section 564(b)(1) of the Act, 21 U.S.C. section 360bbb-3(b)(1), unless the authorization is terminated or revoked sooner. Performed at [facility], [place] [address], [place], [place] [address]   Gram stain     Status: None   Collection Time: 01/12/19  3:07 PM  Result Value Ref Range Status   Specimen Description FLUID PERITONEAL  Final   Special Requests NONE  Final   Gram Stain   Final    ABUNDANT WBC PRESENT, PREDOMINANTLY MONONUCLEAR NO ORGANISMS SEEN Performed at [facility], [place] [address] [place], [place] [address]    Report Status 01/12/2019 FINAL  Final  Culture, body fluid-bottle     Status: None (Preliminary result)   Collection Time: 01/12/19  3:07 PM  Result Value Ref Range Status   Specimen Description FLUID PERITONEAL  Final   Special Requests BOTTLES DRAWN AEROBIC AND ANAEROBIC 10CC  Final   Culture   Final    NO GROWTH < 24 HOURS Performed at [facility], [place] [address], [place], [place] [address]    Report Status PENDING  Incomplete     Labs: BNP (last 3 results) Recent Labs    10/18/18 0645  BNP 825.0*   Basic Metabolic Panel: Recent Labs  Lab 01/11/19 1951  NA 138  K 3.8  CL 99  CO2 28  GLUCOSE 110*  BUN 6  CREATININE 0.79  CALCIUM 8.4*   Liver Function Tests: Recent Labs  Lab 01/12/19 0040  AST 21  ALT 10  ALKPHOS 69   BILITOT 0.6  PROT  7.3  ALBUMIN 3.0*   Recent Labs  Lab 01/12/19 0040  LIPASE 29   Recent Labs  Lab 01/12/19 0608  AMMONIA 29   CBC: Recent Labs  Lab 01/11/19 1951 01/12/19 0040  WBC 6.7 6.2  NEUTROABS 4.1 3.6  HGB 8.0* 8.1*  HCT 28.7* 29.4*  MCV 77.8* 78.6*  PLT 197 204   Cardiac Enzymes: No results for input(s): CKTOTAL, CKMB, CKMBINDEX, TROPONINI in the last 168 hours. BNP: Invalid input(s): POCBNP CBG: Recent Labs  Lab 01/12/19 0555 01/12/19 1205 01/12/19 1622 01/12/19 2103 01/13/19 0557  GLUCAP 139* 297* 111* 138* 104*   D-Dimer No results for input(s): DDIMER in the last 72 hours. Hgb A1c No results for input(s): HGBA1C in the last 72 hours. Lipid Profile No results for input(s): CHOL, HDL, LDLCALC, TRIG, CHOLHDL, LDLDIRECT in the last 72 hours. Thyroid function studies No results for input(s): TSH, T4TOTAL, T3FREE, THYROIDAB in the last 72 hours.  Invalid input(s): FREET3 Anemia work up Recent Labs    01/12/19 0040  VITAMINB12 230  FOLATE 13.1  FERRITIN 11  TIBC 405  IRON 16*  RETICCTPCT 2.0   Urinalysis    Component Value Date/Time   COLORURINE STRAW (A) 01/11/2019 2036   APPEARANCEUR CLEAR 01/11/2019 2036   LABSPEC 1.003 (L) 01/11/2019 2036   PHURINE 6.0 01/11/2019 2036   GLUCOSEU NEGATIVE 01/11/2019 2036   HGBUR NEGATIVE 01/11/2019 2036   BILIRUBINUR NEGATIVE 01/11/2019 2036   KETONESUR NEGATIVE 01/11/2019 2036   PROTEINUR NEGATIVE 01/11/2019 2036   UROBILINOGEN 0.2 12/09/2014 0840   NITRITE NEGATIVE 01/11/2019 2036   LEUKOCYTESUR NEGATIVE 01/11/2019 2036   Sepsis Labs Invalid input(s): PROCALCITONIN,  WBC,  LACTICIDVEN Microbiology Recent Results (from the past 240 hour(s))  Respiratory Panel by PCR     Status: None   Collection Time: 01/12/19  1:25 AM  Result Value Ref Range Status   Adenovirus NOT DETECTED NOT DETECTED Final   Coronavirus 229E NOT DETECTED NOT DETECTED Final    Comment: (NOTE) The Coronavirus on the  Respiratory Panel, DOES NOT test for the novel  Coronavirus (2019 nCoV)    Coronavirus HKU1 NOT DETECTED NOT DETECTED Final   Coronavirus NL63 NOT DETECTED NOT DETECTED Final   Coronavirus OC43 NOT DETECTED NOT DETECTED Final   Metapneumovirus NOT DETECTED NOT DETECTED Final   Rhinovirus / Enterovirus NOT DETECTED NOT DETECTED Final   Influenza A NOT DETECTED NOT DETECTED Final   Influenza B NOT DETECTED NOT DETECTED Final   Parainfluenza Virus 1 NOT DETECTED NOT DETECTED Final   Parainfluenza Virus 2 NOT DETECTED NOT DETECTED Final   Parainfluenza Virus 3 NOT DETECTED NOT DETECTED Final   Parainfluenza Virus 4 NOT DETECTED NOT DETECTED Final   Respiratory Syncytial Virus NOT DETECTED NOT DETECTED Final   Bordetella pertussis NOT DETECTED NOT DETECTED Final   Chlamydophila pneumoniae NOT DETECTED NOT DETECTED Final   Mycoplasma pneumoniae NOT DETECTED NOT DETECTED Final    Comment: Performed at [facility], [place] [address] [place], [place] [address]  SARS Coronavirus 2 [facility] order, Performed in [facility] hospital lab)     Status: None   Collection Time: 01/12/19  1:25 AM  Result Value Ref Range Status   SARS Coronavirus 2 NEGATIVE NEGATIVE Final    Comment: (NOTE) If result is NEGATIVE SARS-CoV-2 target nucleic acids are NOT DETECTED. The SARS-CoV-2 RNA is generally detectable in upper and lower  respiratory specimens during the acute phase of infection. The lowest  concentration of SARS-CoV-2 viral  copies this assay can detect is 250  copies / mL. A negative result does not preclude SARS-CoV-2 infection  and should not be used as the sole basis for treatment or other  patient management decisions.  A negative result may occur with  improper specimen collection / handling, submission of specimen other  than nasopharyngeal swab, presence of viral mutation(s) within the  areas targeted by this assay, and inadequate number of viral copies  (<250 copies / mL). A negative  result must be combined with clinical  observations, patient history, and epidemiological information. If result is POSITIVE SARS-CoV-2 target nucleic acids are DETECTED. The SARS-CoV-2 RNA is generally detectable in upper and lower  respiratory specimens dur ing the acute phase of infection.  Positive  results are indicative of active infection with SARS-CoV-2.  Clinical  correlation with patient history and other diagnostic information is  necessary to determine patient infection status.  Positive results do  not rule out bacterial infection or co-infection with other viruses. If result is PRESUMPTIVE POSTIVE SARS-CoV-2 nucleic acids MAY BE PRESENT.   A presumptive positive result was obtained on the submitted specimen  and confirmed on repeat testing.  While 2019 novel coronavirus  (SARS-CoV-2) nucleic acids may be present in the submitted sample  additional confirmatory testing may be necessary for epidemiological  and / or clinical management purposes  to differentiate between  SARS-CoV-2 and other Sarbecovirus currently known to infect humans.  If clinically indicated additional testing with an alternate test  methodology [phone]) is advised. The SARS-CoV-2 RNA is generally  detectable in upper and lower respiratory sp ecimens during the acute  phase of infection. The expected result is Negative. Fact Sheet for Patients:  StrictlyIdeas.no Fact Sheet for Healthcare Providers: BankingDealers.co.za This test is not yet approved or cleared by the [place] FDA and has been authorized for detection and/or diagnosis of SARS-CoV-2 by FDA under an Emergency Use Authorization (EUA).  This EUA will remain in effect (meaning this test can be used) for the duration of the COVID-19 declaration under Section 564(b)(1) of the Act, 21 U.S.C. section 360bbb-3(b)(1), unless the authorization is terminated or revoked sooner. Performed at [facility], [place] [address], [place], [place] [address]   Gram stain     Status: None   Collection Time: 01/12/19  3:07 PM  Result Value Ref Range Status   Specimen Description FLUID PERITONEAL  Final   Special Requests NONE  Final   Gram Stain   Final    ABUNDANT WBC PRESENT, PREDOMINANTLY MONONUCLEAR NO ORGANISMS SEEN Performed at [facility], [place] [address], [place], [place] [address]    Report Status 01/12/2019 FINAL  Final  Culture, body fluid-bottle     Status: None (Preliminary result)   Collection Time: 01/12/19  3:07 PM  Result Value Ref Range Status   Specimen Description FLUID PERITONEAL  Final   Special Requests BOTTLES DRAWN AEROBIC AND ANAEROBIC 10CC  Final   Culture   Final    NO GROWTH < 24 HOURS Performed at [facility], [place] [address], [place], [place] [address]    Report Status PENDING  Incomplete     Time coordinating discharge: 32 minutes  SIGNED:   Barb Merino, MD  [facility] 01/13/2019, 9:21 AM Pager [phone]  If 7PM-7AM, please contact night-coverage [email or web address] Password [email or web address]

## 2019-01-13 NOTE — Progress Notes (Signed)
Patient alert and oriented, vital sign stable, pain med given per order, iv and tele d/c . Discharge instruction explain and given to the patient, all questions answered, pataient verbalized understanding. Patient d/c home per order.

## 2019-01-17 LAB — CULTURE, BODY FLUID W GRAM STAIN -BOTTLE: Culture: NO GROWTH

## 2019-01-27 ENCOUNTER — Inpatient Hospital Stay: Payer: Medicare Other | Admitting: Primary Care

## 2019-04-18 ENCOUNTER — Other Ambulatory Visit: Payer: Self-pay | Admitting: Family Medicine

## 2019-04-18 DIAGNOSIS — Z1231 Encounter for screening mammogram for malignant neoplasm of breast: Secondary | ICD-10-CM

## 2019-04-21 ENCOUNTER — Ambulatory Visit
Admission: RE | Admit: 2019-04-21 | Discharge: 2019-04-21 | Disposition: A | Discharge status: Home / Self Care | Payer: Medicare Other | Source: Ambulatory Visit | Attending: Family Medicine | Admitting: Family Medicine

## 2019-04-21 ENCOUNTER — Other Ambulatory Visit: Payer: Self-pay

## 2019-04-21 DIAGNOSIS — Z1231 Encounter for screening mammogram for malignant neoplasm of breast: Secondary | ICD-10-CM

## 2019-04-30 ENCOUNTER — Emergency Department (HOSPITAL_COMMUNITY): Payer: Medicare Other

## 2019-04-30 ENCOUNTER — Other Ambulatory Visit: Payer: Self-pay

## 2019-04-30 ENCOUNTER — Encounter (HOSPITAL_COMMUNITY): Payer: Self-pay | Admitting: *Deleted

## 2019-04-30 ENCOUNTER — Inpatient Hospital Stay (HOSPITAL_COMMUNITY)
Admission: EM | Admit: 2019-04-30 | Discharge: 2019-05-03 | DRG: 433 | Disposition: A | Discharge status: Home / Self Care | Payer: Medicare Other | Attending: Family Medicine | Admitting: Family Medicine

## 2019-04-30 DIAGNOSIS — Z90710 Acquired absence of both cervix and uterus: Secondary | ICD-10-CM

## 2019-04-30 DIAGNOSIS — Z79899 Other long term (current) drug therapy: Secondary | ICD-10-CM

## 2019-04-30 DIAGNOSIS — E1159 Type 2 diabetes mellitus with other circulatory complications: Secondary | ICD-10-CM | POA: Diagnosis present

## 2019-04-30 DIAGNOSIS — F418 Other specified anxiety disorders: Secondary | ICD-10-CM | POA: Diagnosis present

## 2019-04-30 DIAGNOSIS — M4310 Spondylolisthesis, site unspecified: Secondary | ICD-10-CM

## 2019-04-30 DIAGNOSIS — Z886 Allergy status to analgesic agent status: Secondary | ICD-10-CM

## 2019-04-30 DIAGNOSIS — K5903 Drug induced constipation: Secondary | ICD-10-CM | POA: Diagnosis present

## 2019-04-30 DIAGNOSIS — F1390 Sedative, hypnotic, or anxiolytic use, unspecified, uncomplicated: Secondary | ICD-10-CM | POA: Diagnosis present

## 2019-04-30 DIAGNOSIS — Z881 Allergy status to other antibiotic agents status: Secondary | ICD-10-CM

## 2019-04-30 DIAGNOSIS — G4733 Obstructive sleep apnea (adult) (pediatric): Secondary | ICD-10-CM | POA: Diagnosis present

## 2019-04-30 DIAGNOSIS — M545 Low back pain: Secondary | ICD-10-CM | POA: Diagnosis present

## 2019-04-30 DIAGNOSIS — R109 Unspecified abdominal pain: Secondary | ICD-10-CM

## 2019-04-30 DIAGNOSIS — E1169 Type 2 diabetes mellitus with other specified complication: Secondary | ICD-10-CM | POA: Diagnosis present

## 2019-04-30 DIAGNOSIS — E785 Hyperlipidemia, unspecified: Secondary | ICD-10-CM | POA: Diagnosis present

## 2019-04-30 DIAGNOSIS — I2510 Atherosclerotic heart disease of native coronary artery without angina pectoris: Secondary | ICD-10-CM | POA: Diagnosis present

## 2019-04-30 DIAGNOSIS — Z7951 Long term (current) use of inhaled steroids: Secondary | ICD-10-CM

## 2019-04-30 DIAGNOSIS — Z20828 Contact with and (suspected) exposure to other viral communicable diseases: Secondary | ICD-10-CM | POA: Diagnosis present

## 2019-04-30 DIAGNOSIS — R9431 Abnormal electrocardiogram [ECG] [EKG]: Secondary | ICD-10-CM

## 2019-04-30 DIAGNOSIS — K7581 Nonalcoholic steatohepatitis (NASH): Secondary | ICD-10-CM | POA: Diagnosis present

## 2019-04-30 DIAGNOSIS — Z9114 Patient's other noncompliance with medication regimen: Secondary | ICD-10-CM

## 2019-04-30 DIAGNOSIS — J449 Chronic obstructive pulmonary disease, unspecified: Secondary | ICD-10-CM | POA: Diagnosis present

## 2019-04-30 DIAGNOSIS — F1190 Opioid use, unspecified, uncomplicated: Secondary | ICD-10-CM | POA: Diagnosis present

## 2019-04-30 DIAGNOSIS — R188 Other ascites: Secondary | ICD-10-CM | POA: Diagnosis present

## 2019-04-30 DIAGNOSIS — T402X5A Adverse effect of other opioids, initial encounter: Secondary | ICD-10-CM | POA: Diagnosis present

## 2019-04-30 DIAGNOSIS — F419 Anxiety disorder, unspecified: Secondary | ICD-10-CM | POA: Diagnosis present

## 2019-04-30 DIAGNOSIS — M4300 Spondylolysis, site unspecified: Secondary | ICD-10-CM

## 2019-04-30 DIAGNOSIS — Z6841 Body Mass Index (BMI) 40.0 and over, adult: Secondary | ICD-10-CM

## 2019-04-30 DIAGNOSIS — F329 Major depressive disorder, single episode, unspecified: Secondary | ICD-10-CM | POA: Diagnosis present

## 2019-04-30 DIAGNOSIS — K7460 Unspecified cirrhosis of liver: Principal | ICD-10-CM

## 2019-04-30 DIAGNOSIS — Z8249 Family history of ischemic heart disease and other diseases of the circulatory system: Secondary | ICD-10-CM

## 2019-04-30 DIAGNOSIS — Z79890 Hormone replacement therapy (postmenopausal): Secondary | ICD-10-CM

## 2019-04-30 DIAGNOSIS — F1120 Opioid dependence, uncomplicated: Secondary | ICD-10-CM | POA: Diagnosis present

## 2019-04-30 DIAGNOSIS — K760 Fatty (change of) liver, not elsewhere classified: Secondary | ICD-10-CM | POA: Diagnosis present

## 2019-04-30 DIAGNOSIS — I152 Hypertension secondary to endocrine disorders: Secondary | ICD-10-CM | POA: Diagnosis present

## 2019-04-30 DIAGNOSIS — G8929 Other chronic pain: Secondary | ICD-10-CM | POA: Diagnosis present

## 2019-04-30 DIAGNOSIS — Z7984 Long term (current) use of oral hypoglycemic drugs: Secondary | ICD-10-CM

## 2019-04-30 DIAGNOSIS — K766 Portal hypertension: Secondary | ICD-10-CM | POA: Diagnosis present

## 2019-04-30 DIAGNOSIS — G43909 Migraine, unspecified, not intractable, without status migrainosus: Secondary | ICD-10-CM | POA: Diagnosis present

## 2019-04-30 DIAGNOSIS — M1990 Unspecified osteoarthritis, unspecified site: Secondary | ICD-10-CM | POA: Diagnosis present

## 2019-04-30 DIAGNOSIS — Z880 Allergy status to penicillin: Secondary | ICD-10-CM

## 2019-04-30 DIAGNOSIS — F17210 Nicotine dependence, cigarettes, uncomplicated: Secondary | ICD-10-CM | POA: Diagnosis present

## 2019-04-30 DIAGNOSIS — K219 Gastro-esophageal reflux disease without esophagitis: Secondary | ICD-10-CM | POA: Diagnosis present

## 2019-04-30 HISTORY — DX: Hypertension secondary to endocrine disorders: I15.2

## 2019-04-30 HISTORY — DX: Lobar pneumonia, unspecified organism: J18.1

## 2019-04-30 HISTORY — DX: Type 2 diabetes mellitus with other circulatory complications: E11.59

## 2019-04-30 LAB — CBC
HCT: 39.7 % (ref 36.0–46.0)
Hemoglobin: 11.6 g/dL — ABNORMAL LOW (ref 12.0–15.0)
MCH: 24.8 pg — ABNORMAL LOW (ref 26.0–34.0)
MCHC: 29.2 g/dL — ABNORMAL LOW (ref 30.0–36.0)
MCV: 85 fL (ref 80.0–100.0)
Platelets: 156 10*3/uL (ref 150–400)
RBC: 4.67 MIL/uL (ref 3.87–5.11)
RDW: 19.2 % — ABNORMAL HIGH (ref 11.5–15.5)
WBC: 5.5 10*3/uL (ref 4.0–10.5)
nRBC: 0 % (ref 0.0–0.2)

## 2019-04-30 LAB — URINALYSIS, ROUTINE W REFLEX MICROSCOPIC
Bilirubin Urine: NEGATIVE
Glucose, UA: NEGATIVE mg/dL
Hgb urine dipstick: NEGATIVE
Ketones, ur: NEGATIVE mg/dL
Leukocytes,Ua: NEGATIVE
Nitrite: NEGATIVE
Protein, ur: NEGATIVE mg/dL
Specific Gravity, Urine: 1.003 — ABNORMAL LOW (ref 1.005–1.030)
pH: 6 (ref 5.0–8.0)

## 2019-04-30 LAB — I-STAT BETA HCG BLOOD, ED (MC, WL, AP ONLY): I-stat hCG, quantitative: <5 m[IU]/mL (ref <5)

## 2019-04-30 LAB — COMPREHENSIVE METABOLIC PANEL
ALT: 16 U/L (ref 0–44)
AST: 29 U/L (ref 15–41)
Albumin: 3.4 g/dL — ABNORMAL LOW (ref 3.5–5.0)
Alkaline Phosphatase: 77 U/L (ref 38–126)
Anion gap: 10 (ref 5–15)
BUN: 6 mg/dL (ref 6–20)
CO2: 30 mmol/L (ref 22–32)
Calcium: 8.6 mg/dL — ABNORMAL LOW (ref 8.9–10.3)
Chloride: 99 mmol/L (ref 98–111)
Creatinine, Ser: 0.74 mg/dL (ref 0.44–1.00)
GFR calc Af Amer: >60 mL/min (ref >60)
GFR calc non Af Amer: >60 mL/min (ref >60)
Glucose, Bld: 104 mg/dL — ABNORMAL HIGH (ref 70–99)
Potassium: 3.6 mmol/L (ref 3.5–5.1)
Sodium: 139 mmol/L (ref 135–145)
Total Bilirubin: 0.6 mg/dL (ref 0.3–1.2)
Total Protein: 7.7 g/dL (ref 6.5–8.1)

## 2019-04-30 LAB — LIPASE, BLOOD: Lipase: 30 U/L (ref 11–51)

## 2019-04-30 MED ORDER — HYDROMORPHONE HCL 1 MG/ML IJ SOLN
2.0000 mg | Freq: Once | INTRAMUSCULAR | Status: AC
  Administered 2019-05-01: 2 mg via INTRAVENOUS
  Filled 2019-04-30: qty 2

## 2019-04-30 MED ORDER — HYDROMORPHONE HCL 1 MG/ML IJ SOLN
1.0000 mg | Freq: Once | INTRAMUSCULAR | Status: AC
  Administered 2019-04-30: 1 mg via INTRAVENOUS
  Filled 2019-04-30: qty 1

## 2019-04-30 MED ORDER — IOHEXOL 300 MG/ML  SOLN
100.0000 mL | Freq: Once | INTRAMUSCULAR | Status: AC | PRN
  Administered 2019-04-30: 100 mL via INTRAVENOUS

## 2019-04-30 MED ORDER — SODIUM CHLORIDE 0.9% FLUSH
3.0000 mL | Freq: Once | INTRAVENOUS | Status: AC
  Administered 2019-04-30: 3 mL via INTRAVENOUS
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
## 2019-04-30 NOTE — ED Triage Notes (Signed)
abd pain since July also c/o a headache

## 2019-04-30 NOTE — ED Notes (Addendum)
Patient transported to CT 

## 2019-05-01 ENCOUNTER — Encounter (HOSPITAL_COMMUNITY): Payer: Self-pay | Admitting: Family Medicine

## 2019-05-01 DIAGNOSIS — Z79899 Other long term (current) drug therapy: Secondary | ICD-10-CM | POA: Diagnosis not present

## 2019-05-01 DIAGNOSIS — E1169 Type 2 diabetes mellitus with other specified complication: Secondary | ICD-10-CM | POA: Diagnosis present

## 2019-05-01 DIAGNOSIS — K7460 Unspecified cirrhosis of liver: Secondary | ICD-10-CM

## 2019-05-01 DIAGNOSIS — R188 Other ascites: Secondary | ICD-10-CM | POA: Diagnosis present

## 2019-05-01 DIAGNOSIS — K5903 Drug induced constipation: Secondary | ICD-10-CM | POA: Diagnosis present

## 2019-05-01 DIAGNOSIS — I152 Hypertension secondary to endocrine disorders: Secondary | ICD-10-CM | POA: Diagnosis present

## 2019-05-01 DIAGNOSIS — Z6841 Body Mass Index (BMI) 40.0 and over, adult: Secondary | ICD-10-CM | POA: Diagnosis not present

## 2019-05-01 DIAGNOSIS — E1159 Type 2 diabetes mellitus with other circulatory complications: Secondary | ICD-10-CM

## 2019-05-01 DIAGNOSIS — M4300 Spondylolysis, site unspecified: Secondary | ICD-10-CM

## 2019-05-01 DIAGNOSIS — Z20828 Contact with and (suspected) exposure to other viral communicable diseases: Secondary | ICD-10-CM | POA: Diagnosis present

## 2019-05-01 DIAGNOSIS — T402X5A Adverse effect of other opioids, initial encounter: Secondary | ICD-10-CM | POA: Diagnosis present

## 2019-05-01 DIAGNOSIS — F1390 Sedative, hypnotic, or anxiolytic use, unspecified, uncomplicated: Secondary | ICD-10-CM | POA: Diagnosis present

## 2019-05-01 DIAGNOSIS — Z9114 Patient's other noncompliance with medication regimen: Secondary | ICD-10-CM | POA: Diagnosis not present

## 2019-05-01 DIAGNOSIS — K760 Fatty (change of) liver, not elsewhere classified: Secondary | ICD-10-CM | POA: Diagnosis present

## 2019-05-01 DIAGNOSIS — K219 Gastro-esophageal reflux disease without esophagitis: Secondary | ICD-10-CM | POA: Diagnosis present

## 2019-05-01 DIAGNOSIS — G8929 Other chronic pain: Secondary | ICD-10-CM | POA: Diagnosis present

## 2019-05-01 DIAGNOSIS — K766 Portal hypertension: Secondary | ICD-10-CM | POA: Diagnosis present

## 2019-05-01 DIAGNOSIS — M4310 Spondylolisthesis, site unspecified: Secondary | ICD-10-CM

## 2019-05-01 DIAGNOSIS — G4733 Obstructive sleep apnea (adult) (pediatric): Secondary | ICD-10-CM | POA: Diagnosis present

## 2019-05-01 DIAGNOSIS — F1120 Opioid dependence, uncomplicated: Secondary | ICD-10-CM | POA: Diagnosis present

## 2019-05-01 DIAGNOSIS — J449 Chronic obstructive pulmonary disease, unspecified: Secondary | ICD-10-CM | POA: Diagnosis present

## 2019-05-01 DIAGNOSIS — E669 Obesity, unspecified: Secondary | ICD-10-CM | POA: Diagnosis not present

## 2019-05-01 DIAGNOSIS — E785 Hyperlipidemia, unspecified: Secondary | ICD-10-CM | POA: Diagnosis present

## 2019-05-01 DIAGNOSIS — K7581 Nonalcoholic steatohepatitis (NASH): Secondary | ICD-10-CM | POA: Diagnosis present

## 2019-05-01 DIAGNOSIS — R109 Unspecified abdominal pain: Secondary | ICD-10-CM

## 2019-05-01 DIAGNOSIS — I10 Essential (primary) hypertension: Secondary | ICD-10-CM

## 2019-05-01 DIAGNOSIS — I2510 Atherosclerotic heart disease of native coronary artery without angina pectoris: Secondary | ICD-10-CM | POA: Diagnosis present

## 2019-05-01 DIAGNOSIS — F1190 Opioid use, unspecified, uncomplicated: Secondary | ICD-10-CM | POA: Diagnosis present

## 2019-05-01 DIAGNOSIS — M545 Low back pain: Secondary | ICD-10-CM | POA: Diagnosis present

## 2019-05-01 DIAGNOSIS — G43909 Migraine, unspecified, not intractable, without status migrainosus: Secondary | ICD-10-CM | POA: Diagnosis present

## 2019-05-01 HISTORY — DX: Other long term (current) drug therapy: Z79.899

## 2019-05-01 HISTORY — DX: Unspecified cirrhosis of liver: K74.60

## 2019-05-01 HISTORY — DX: Opioid use, unspecified, uncomplicated: F11.90

## 2019-05-01 HISTORY — DX: Other ascites: R18.8

## 2019-05-01 HISTORY — DX: Spondylolisthesis, site unspecified: M43.10

## 2019-05-01 HISTORY — DX: Spondylolysis, site unspecified: M43.00

## 2019-05-01 LAB — BASIC METABOLIC PANEL
Anion gap: 11 (ref 5–15)
BUN: 5 mg/dL — ABNORMAL LOW (ref 6–20)
CO2: 28 mmol/L (ref 22–32)
Calcium: 8.4 mg/dL — ABNORMAL LOW (ref 8.9–10.3)
Chloride: 97 mmol/L — ABNORMAL LOW (ref 98–111)
Creatinine, Ser: 0.71 mg/dL (ref 0.44–1.00)
GFR calc Af Amer: >60 mL/min (ref >60)
GFR calc non Af Amer: >60 mL/min (ref >60)
Glucose, Bld: 87 mg/dL (ref 70–99)
Potassium: 3.9 mmol/L (ref 3.5–5.1)
Sodium: 136 mmol/L (ref 135–145)

## 2019-05-01 LAB — SARS CORONAVIRUS 2 BY RT PCR (HOSPITAL ORDER, PERFORMED IN ~~LOC~~ HOSPITAL LAB): SARS Coronavirus 2: NEGATIVE

## 2019-05-01 LAB — GLUCOSE, CAPILLARY
Glucose-Capillary: 154 mg/dL — ABNORMAL HIGH (ref 70–99)
Glucose-Capillary: 118 mg/dL — ABNORMAL HIGH (ref 70–99)

## 2019-05-01 LAB — LIPID PANEL
Cholesterol: 139 mg/dL (ref 0–200)
HDL: 43 mg/dL (ref >40)
LDL Cholesterol: 74 mg/dL (ref 0–99)
Total CHOL/HDL Ratio: 3.2 RATIO
Triglycerides: 108 mg/dL (ref <150)
VLDL: 22 mg/dL (ref 0–40)

## 2019-05-01 LAB — CBC
HCT: 40.1 % (ref 36.0–46.0)
Hemoglobin: 11.7 g/dL — ABNORMAL LOW (ref 12.0–15.0)
MCH: 25 pg — ABNORMAL LOW (ref 26.0–34.0)
MCHC: 29.2 g/dL — ABNORMAL LOW (ref 30.0–36.0)
MCV: 85.7 fL (ref 80.0–100.0)
Platelets: 158 10*3/uL (ref 150–400)
RBC: 4.68 MIL/uL (ref 3.87–5.11)
RDW: 19.3 % — ABNORMAL HIGH (ref 11.5–15.5)
WBC: 5.9 10*3/uL (ref 4.0–10.5)
nRBC: 0 % (ref 0.0–0.2)

## 2019-05-01 LAB — MAGNESIUM: Magnesium: 1.5 mg/dL — ABNORMAL LOW (ref 1.7–2.4)

## 2019-05-01 LAB — PROTIME-INR
INR: 1.3 — ABNORMAL HIGH (ref 0.8–1.2)
Prothrombin Time: 15.6 seconds — ABNORMAL HIGH (ref 11.4–15.2)

## 2019-05-01 LAB — HEMOGLOBIN A1C
Hgb A1c MFr Bld: 5 % (ref 4.8–5.6)
Mean Plasma Glucose: 96.8 mg/dL

## 2019-05-01 MED ORDER — TORSEMIDE 20 MG PO TABS
20.0000 mg | ORAL_TABLET | Freq: Every day | ORAL | Status: DC
  Administered 2019-05-01 – 2019-05-03 (×3): 20 mg via ORAL
  Filled 2019-05-01 (×3): qty 1

## 2019-05-01 MED ORDER — PSYLLIUM 95 % PO PACK
1.0000 | PACK | Freq: Every day | ORAL | Status: DC
  Administered 2019-05-01 – 2019-05-02 (×2): 1 via ORAL
  Filled 2019-05-01 (×3): qty 1

## 2019-05-01 MED ORDER — ENOXAPARIN SODIUM 40 MG/0.4ML ~~LOC~~ SOLN
40.0000 mg | Freq: Every day | SUBCUTANEOUS | Status: DC
  Administered 2019-05-01 – 2019-05-03 (×3): 40 mg via SUBCUTANEOUS
  Filled 2019-05-01 (×3): qty 0.4

## 2019-05-01 MED ORDER — SPIRONOLACTONE 25 MG PO TABS
100.0000 mg | ORAL_TABLET | Freq: Every day | ORAL | Status: DC
  Administered 2019-05-01 – 2019-05-03 (×3): 100 mg via ORAL
  Filled 2019-05-01 (×3): qty 4

## 2019-05-01 MED ORDER — SPIRONOLACTONE 25 MG PO TABS: 50.0000 mg | ORAL_TABLET | Freq: Every day | ORAL | Status: DC

## 2019-05-01 MED ORDER — OLOPATADINE HCL 0.1 % OP SOLN: 1.0000 [drp] | Freq: Every day | OPHTHALMIC | Status: DC | PRN

## 2019-05-01 MED ORDER — OXYCODONE HCL ER 10 MG PO T12A: 10.0000 mg | EXTENDED_RELEASE_TABLET | Freq: Two times a day (BID) | ORAL | Status: DC

## 2019-05-01 MED ORDER — AMLODIPINE BESYLATE 5 MG PO TABS
5.0000 mg | ORAL_TABLET | Freq: Every day | ORAL | Status: DC
  Administered 2019-05-01 – 2019-05-03 (×3): 5 mg via ORAL
  Filled 2019-05-01 (×3): qty 1

## 2019-05-01 MED ORDER — BENAZEPRIL HCL 40 MG PO TABS
40.0000 mg | ORAL_TABLET | Freq: Every day | ORAL | Status: DC
  Administered 2019-05-01 – 2019-05-03 (×3): 40 mg via ORAL
  Filled 2019-05-01 (×3): qty 1

## 2019-05-01 MED ORDER — SIMVASTATIN 20 MG PO TABS
20.0000 mg | ORAL_TABLET | Freq: Every day | ORAL | Status: DC
  Administered 2019-05-01 – 2019-05-02 (×2): 20 mg via ORAL
  Filled 2019-05-01 (×2): qty 1

## 2019-05-01 MED ORDER — HYDROMORPHONE HCL 1 MG/ML IJ SOLN
0.5000 mg | INTRAMUSCULAR | Status: AC
  Administered 2019-05-01: 0.5 mg via INTRAVENOUS
  Filled 2019-05-01: qty 0.5

## 2019-05-01 MED ORDER — ALBUTEROL SULFATE (2.5 MG/3ML) 0.083% IN NEBU: 2.5000 mg | INHALATION_SOLUTION | RESPIRATORY_TRACT | Status: DC | PRN

## 2019-05-01 MED ORDER — ALBUMIN HUMAN 5 % IV SOLN
12.5000 g | Freq: Once | INTRAVENOUS | Status: AC
  Administered 2019-05-02: 12.5 g via INTRAVENOUS
  Filled 2019-05-01: qty 250

## 2019-05-01 MED ORDER — POTASSIUM PHOSPHATE MONOBASIC 500 MG PO TABS
500.0000 mg | ORAL_TABLET | Freq: Three times a day (TID) | ORAL | Status: DC
  Filled 2019-05-01: qty 1

## 2019-05-01 MED ORDER — OXYCODONE HCL 5 MG PO TABS
10.0000 mg | ORAL_TABLET | ORAL | Status: DC | PRN
  Administered 2019-05-01 – 2019-05-03 (×14): 10 mg via ORAL
  Filled 2019-05-01 (×14): qty 2

## 2019-05-01 MED ORDER — K PHOS MONO-SOD PHOS DI & MONO 155-852-130 MG PO TABS
500.0000 mg | ORAL_TABLET | Freq: Three times a day (TID) | ORAL | Status: DC
  Administered 2019-05-01 – 2019-05-03 (×6): 500 mg via ORAL
  Filled 2019-05-01 (×9): qty 2

## 2019-05-01 MED ORDER — ACETAMINOPHEN 325 MG PO TABS
650.0000 mg | ORAL_TABLET | Freq: Four times a day (QID) | ORAL | Status: DC | PRN
  Administered 2019-05-02: 650 mg via ORAL
  Filled 2019-05-01: qty 2

## 2019-05-01 MED ORDER — ONDANSETRON HCL 4 MG/2ML IJ SOLN
4.0000 mg | Freq: Four times a day (QID) | INTRAMUSCULAR | Status: DC | PRN
  Administered 2019-05-01: 02:00:00 4 mg via INTRAVENOUS
  Filled 2019-05-01 (×2): qty 2

## 2019-05-01 MED ORDER — BACLOFEN 10 MG PO TABS
10.0000 mg | ORAL_TABLET | Freq: Three times a day (TID) | ORAL | Status: DC
  Administered 2019-05-01 – 2019-05-03 (×7): 10 mg via ORAL
  Filled 2019-05-01 (×7): qty 1

## 2019-05-01 MED ORDER — CYCLOBENZAPRINE HCL 10 MG PO TABS: 10.0000 mg | ORAL_TABLET | Freq: Three times a day (TID) | ORAL | Status: DC | PRN

## 2019-05-01 MED ORDER — MOMETASONE FURO-FORMOTEROL FUM 200-5 MCG/ACT IN AERO
2.0000 | INHALATION_SPRAY | Freq: Two times a day (BID) | RESPIRATORY_TRACT | Status: DC
  Administered 2019-05-01 – 2019-05-03 (×5): 2 via RESPIRATORY_TRACT
  Filled 2019-05-01: qty 8.8

## 2019-05-01 MED ORDER — SENNA 8.6 MG PO TABS
2.0000 | ORAL_TABLET | Freq: Two times a day (BID) | ORAL | Status: DC
  Administered 2019-05-01 – 2019-05-02 (×4): 17.2 mg via ORAL
  Filled 2019-05-01 (×4): qty 2

## 2019-05-01 MED ORDER — SODIUM CHLORIDE 0.9% FLUSH
3.0000 mL | Freq: Two times a day (BID) | INTRAVENOUS | Status: DC
  Administered 2019-05-01 – 2019-05-02 (×5): 3 mL via INTRAVENOUS

## 2019-05-01 MED ORDER — ONDANSETRON HCL 4 MG PO TABS: 4.0000 mg | ORAL_TABLET | Freq: Four times a day (QID) | ORAL | Status: DC | PRN

## 2019-05-01 MED ORDER — KETOROLAC TROMETHAMINE 30 MG/ML IJ SOLN
30.0000 mg | Freq: Four times a day (QID) | INTRAMUSCULAR | Status: DC | PRN
  Administered 2019-05-01: 30 mg via INTRAVENOUS
  Filled 2019-05-01: qty 1

## 2019-05-01 MED ORDER — ESCITALOPRAM OXALATE 20 MG PO TABS
20.0000 mg | ORAL_TABLET | Freq: Every day | ORAL | Status: DC
  Administered 2019-05-01 – 2019-05-03 (×3): 20 mg via ORAL
  Filled 2019-05-01 (×3): qty 1

## 2019-05-01 MED ORDER — FLUTICASONE PROPIONATE 50 MCG/ACT NA SUSP
2.0000 | Freq: Every day | NASAL | Status: DC
  Administered 2019-05-01 – 2019-05-03 (×3): 2 via NASAL
  Filled 2019-05-01: qty 16

## 2019-05-01 MED ORDER — MAGNESIUM SULFATE 2 GM/50ML IV SOLN
2.0000 g | Freq: Every day | INTRAVENOUS | Status: DC
  Administered 2019-05-01 – 2019-05-03 (×3): 2 g via INTRAVENOUS
  Filled 2019-05-01 (×3): qty 50

## 2019-05-01 MED ORDER — ALPRAZOLAM 0.25 MG PO TABS
1.0000 mg | ORAL_TABLET | Freq: Two times a day (BID) | ORAL | Status: DC | PRN
  Administered 2019-05-01: 2 mg via ORAL
  Filled 2019-05-01: qty 4
  Filled 2019-05-01: qty 8

## 2019-05-01 MED ORDER — POLYETHYLENE GLYCOL 3350 17 G PO PACK
17.0000 g | PACK | Freq: Every day | ORAL | Status: DC
  Administered 2019-05-01 – 2019-05-02 (×2): 17 g via ORAL
  Filled 2019-05-01 (×3): qty 1

## 2019-05-01 MED ORDER — ACETAMINOPHEN 650 MG RE SUPP: 650.0000 mg | Freq: Four times a day (QID) | RECTAL | Status: DC | PRN

## 2019-05-01 MED ORDER — PANTOPRAZOLE SODIUM 40 MG PO TBEC
40.0000 mg | DELAYED_RELEASE_TABLET | Freq: Two times a day (BID) | ORAL | Status: DC
  Administered 2019-05-01 – 2019-05-03 (×5): 40 mg via ORAL
  Filled 2019-05-01 (×6): qty 1

## 2019-05-01 MED ORDER — INSULIN ASPART 100 UNIT/ML ~~LOC~~ SOLN: 0.0000 [IU] | Freq: Three times a day (TID) | SUBCUTANEOUS | Status: DC
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
## 2019-05-01 NOTE — Plan of Care (Signed)
  Problem: Education: Goal: Knowledge of General Education information will improve Description Including pain rating scale, medication(s)/side effects and non-pharmacologic comfort measures Outcome: Progressing   

## 2019-05-01 NOTE — ED Notes (Signed)
RN attempted to call report. No answer
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
## 2019-05-01 NOTE — Progress Notes (Signed)
Patient arrived to the unit via bed from the emergency department.  Patient is alert and oriented.  Skin assessment complete.  No skin issues.  IV intact the right wrist.  Placed the patient no telemetry. Educated the patient on how to reach the staff on the unit.  Lowered the bed, activated the bed alarm and placed the call light within reach.  Will continue to monitor the patient and notify as needed
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
## 2019-05-02 ENCOUNTER — Encounter (HOSPITAL_COMMUNITY): Payer: Self-pay | Admitting: Student

## 2019-05-02 ENCOUNTER — Inpatient Hospital Stay (HOSPITAL_COMMUNITY): Payer: Medicare Other

## 2019-05-02 HISTORY — PX: IR PARACENTESIS: IMG2679

## 2019-05-02 LAB — GLUCOSE, CAPILLARY
Glucose-Capillary: 101 mg/dL — ABNORMAL HIGH (ref 70–99)
Glucose-Capillary: 177 mg/dL — ABNORMAL HIGH (ref 70–99)
Glucose-Capillary: 119 mg/dL — ABNORMAL HIGH (ref 70–99)
Glucose-Capillary: 137 mg/dL — ABNORMAL HIGH (ref 70–99)

## 2019-05-02 LAB — PROTEIN, PLEURAL OR PERITONEAL FLUID: Total protein, fluid: 4.7 g/dL

## 2019-05-02 LAB — GRAM STAIN

## 2019-05-02 LAB — MAGNESIUM: Magnesium: 1.8 mg/dL (ref 1.7–2.4)

## 2019-05-02 MED ORDER — SORBITOL 70 % SOLN
960.0000 mL | TOPICAL_OIL | Freq: Once | ORAL | Status: DC
  Filled 2019-05-02: qty 473

## 2019-05-02 MED ORDER — HYDROMORPHONE HCL 1 MG/ML IJ SOLN
0.5000 mg | Freq: Once | INTRAMUSCULAR | Status: AC
  Administered 2019-05-02: 0.5 mg via INTRAVENOUS
  Filled 2019-05-02: qty 0.5

## 2019-05-02 MED ORDER — LIDOCAINE HCL 1 % IJ SOLN
INTRAMUSCULAR | Status: AC
  Filled 2019-05-02: qty 20

## 2019-05-02 MED ORDER — POLYETHYLENE GLYCOL 3350 17 G PO PACK
17.0000 g | PACK | Freq: Once | ORAL | Status: AC
  Administered 2019-05-02: 17 g via ORAL
  Filled 2019-05-02: qty 1

## 2019-05-02 MED ORDER — ALPRAZOLAM 0.5 MG PO TABS
1.0000 mg | ORAL_TABLET | Freq: Two times a day (BID) | ORAL | Status: DC | PRN
  Administered 2019-05-02: 1 mg via ORAL
  Filled 2019-05-02: qty 2

## 2019-05-02 MED ORDER — LIDOCAINE HCL 1 % IJ SOLN
INTRAMUSCULAR | Status: DC | PRN
  Administered 2019-05-02: 10 mL

## 2019-05-02 NOTE — Progress Notes (Addendum)
Pt continues to complain of 10/10 abdominal pain following administration of PRN oxycodone 10mg . FMTS notified. Awaiting orders at this time. Will continue to monitor and treat per MD orders.   Update at 2249: MD returned page stating that she would come to the bedside to speak with the patient and assess her.  2309 update: MD informed this RN that she would place a 1 time dose of 0.5mg  dilaudid for the patient to help her pain.   0015 update: Patient continues to complain of 10/10 abdominal pain. MD notified at [place] update: MD returned page and placed order for an EKG, CT abd/pelvis, and a second dose of 0.5mg  dilaudid. EKG results showed a prolonged QTc, MD was notified. Patient placed on telemetry per MD order  0300 update: Pt was continuing to complain of abdominal pain and new onset nausea after drinking the contrast for the CT abd/pelvis. RN called Simmons,MD for a new PRN for nausea due to zofran's side effects. MD placed order for Compazine. Med was given at [place]. Patient was then taken down for her CT abd/pelvis.   1062 update: Patient returned from CT and vital signs were obtained. Vital signs stable. Patient then went to sleep. Simmons,MD made aware. Patient stated that her pain came down to a 7 after she came back from the CT.

## 2019-05-02 NOTE — Plan of Care (Signed)
Patient complains that pain is not controlled with Oxycodone.   Plan for paracentesis today to remove fluid. Patient also states that she has not had a good BM, oral laxatives given, MD aware.

## 2019-05-02 NOTE — Discharge Summary (Signed)
[facility] Discharge Summary  Patient name: [name] Medical record number: [id number] Date of birth: 04-03-1961 Age: 58 y.o. Gender: female Date of Admission: 04/30/2019  Date of Discharge: 05/03/2019 Admitting Physician: Blane Ohara McDiarmid, MD  Primary Care Provider: Shirline Frees, MD Consultants: GI, Radiology  Indication for Hospitalization: Ascites with cirrhosis/NAFLD  Discharge Diagnoses/Problem List:   Ascites 2/2 Cirrhosis from NAFLD T2DM HTN COPD Chronic Back Pain CAD HLD GERD Anxiety/Depression MorbidObesity OSA  Disposition: Home or Self Care.  Discharge Condition: Stable.  Discharge Exam:  General: Appears well, no acute distress. Age appropriate. Cardiac: RRR, normal heart sounds, no murmurs Respiratory: CTAB, normal effort Abdomen: soft, nontender, nondistended Extremities: No edema or cyanosis. Skin: Warm and dry, no rashes noted Neuro: alert and oriented, no focal deficits Psych: normal affect   Brief Hospital Course:  Ms. [name] was admitted to our service 08/16 for increased worsening ascites due to noncompliance of torsemide.  Gastroenterology was consulted and paracentesis recommended.  She was started back on torsemide and spironolactone. Paracentesis was performed 8/17.  Total of 3.1 L of fluid was removed.  Labs of the fluid returned normal no sign of infection.   Patient also complained of 2 days of constipation likely due to opioid dependence.  Her constipation resolved during her stay with multiple doses of Miralax. Patient is a chronic pain patient, and was counseled on possibly decreasing her opioid dependence.  We also addressed her benzodiazepine use.  Patient stated she has been on it for years and and uses much less than prescribed.  Upon discharge home recommended following up with her PCP and GI as well as considering therapy for her anxiety.    Issues for Follow Up:  1. Patient with NAFLD taking  multiple opioids and benzodiazepines.  Patient did not require many benzodiazepines while admitted, recommend weaning as able. Consider psych eval and therapy. 2. Discontinued patient's Flexeril and changed to baclofen given that it is safer in NAFLD. 3. Patient has been counseled about taking her opioids with decrease liver function.  Significant Procedures:   IR Paracentesis Result date 05/02/2019 FINDINGS: A total of approximately 3.1 L of hazy amber fluid was removed. Samples were sent to the laboratory as requested by the clinical team. IMPRESSION: Successful ultrasound-guided paracentesis yielding 3.1 L of peritoneal fluid.   Significant Labs and Imaging:  Recent Labs  Lab 04/30/19 1740 05/01/19 0343  WBC 5.5 5.9  HGB 11.6* 11.7*  HCT 39.7 40.1  PLT 156 158   Recent Labs  Lab 04/30/19 1740 05/01/19 0116 05/01/19 0343 05/02/19 0455  NA 139  --  136  --   K 3.6  --  3.9  --   CL 99  --  97*  --   CO2 30  --  28  --   GLUCOSE 104*  --  87  --   BUN 6  --  5*  --   CREATININE 0.74  --  0.71  --   CALCIUM 8.6*  --  8.4*  --   MG  --  1.5*  --  1.8  ALKPHOS 77  --   --   --   AST 29  --   --   --   ALT 16  --   --   --   ALBUMIN 3.4*  --   --   --    Albumin, peritoneal fluid 2.5 Total protein, peritoneal fluid 4.7 SAAG 0.9  EXAM: Result date 05/03/2019 CT  ABDOMEN AND PELVIS WITH CONTRAST  IMPRESSION: 1. Decrease in abdominopelvic ascites after paracentesis. No evidence of complication. 2. Cirrhosis and splenomegaly. 3. Bilateral L5 pars interarticularis defects with grade 1 anterolisthesis of L5 on S1.  Results/Tests Pending at Time of Discharge: None  Discharge Medications:  Allergies as of 05/03/2019      Reactions   Amoxicillin Nausea And Vomiting   tolerated augmentin in the past (03/2013) without issue. Did it involve swelling of the face/tongue/throat, SOB, or low BP? No Did it involve sudden or severe rash/hives, skin peeling, or any reaction  on the inside of your mouth or nose? No Did you need to seek medical attention at a hospital or doctor's office? No When did it last happen? unk If all above answers are "NO", may proceed with cephalosporin use.   Clindamycin/lincomycin Nausea And Vomiting   Doxycycline Nausea And Vomiting   Treximet [sumatriptan-naproxen Sodium] Nausea And Vomiting   Bactrim Hives, Rash      Medication List    STOP taking these medications   cyclobenzaprine 10 MG tablet Commonly known as: FLEXERIL   docusate sodium 250 MG capsule Commonly known as: COLACE   levocetirizine 5 MG tablet Commonly known as: XYZAL   Magnesium 250 MG Tabs   meloxicam 15 MG tablet Commonly known as: MOBIC   ondansetron 8 MG tablet Commonly known as: ZOFRAN   promethazine 25 MG tablet Commonly known as: PHENERGAN     TAKE these medications   acetaminophen 325 MG tablet Commonly known as: TYLENOL Take 2 tablets (650 mg total) by mouth every 6 (six) hours as needed for headache.   albuterol (2.5 MG/3ML) 0.083% nebulizer solution Commonly known as: PROVENTIL Take 2.5 mg by nebulization every 6 (six) hours as needed for wheezing or shortness of breath.   albuterol 108 (90 Base) MCG/ACT inhaler Commonly known as: VENTOLIN HFA Inhale 2 puffs into the lungs every 4 (four) hours as needed for wheezing or shortness of breath.   ALPRAZolam 1 MG tablet Commonly known as: XANAX Take 1-2 mg by mouth See admin instructions. Take 1 mg in the morning and 2 mg at bedtime.   amLODipine 5 MG tablet Commonly known as: NORVASC Take 5-10 mg by mouth daily.   baclofen 10 MG tablet Commonly known as: LIORESAL Take 1 tablet (10 mg total) by mouth 3 (three) times daily as needed for muscle spasms.   benazepril 40 MG tablet Commonly known as: LOTENSIN Take 40 mg by mouth daily.   budesonide-formoterol 160-4.5 MCG/ACT inhaler Commonly known as: SYMBICORT Inhale 2 puffs into the lungs 2 (two) times daily as needed  (SOB).   escitalopram 20 MG tablet Commonly known as: LEXAPRO Take 20 mg by mouth daily.   estradiol 2 MG tablet Commonly known as: ESTRACE Take 2 mg by mouth daily.   ferrous sulfate 325 (65 FE) MG tablet Take 325 mg by mouth 3 (three) times a week. TUES THUR SAT   fluticasone 50 MCG/ACT nasal spray Commonly known as: FLONASE Place 2 sprays into both nostrils daily.   ipratropium-albuterol 0.5-2.5 (3) MG/3ML Soln Commonly known as: DUONEB Take 3 mLs by nebulization every 6 (six) hours as needed (Shortness of breath).   metFORMIN 500 MG tablet Commonly known as: GLUCOPHAGE Take 2 tablets (1,000 mg total) by mouth 2 (two) times daily with a meal. What changed: how much to take   multivitamin with minerals Tabs tablet Take 1 tablet by mouth daily.   nystatin powder Commonly known as: MYCOSTATIN/NYSTOP Apply  1 g topically daily.   olopatadine 0.1 % ophthalmic solution Commonly known as: PATANOL Place 1 drop into both eyes daily as needed for allergies.   Oxycodone HCl 10 MG Tabs Take 10 mg by mouth every 4 (four) hours.   pantoprazole 40 MG tablet Commonly known as: PROTONIX Take 40 mg by mouth 2 (two) times daily.   polyethylene glycol 17 g packet Commonly known as: MIRALAX / GLYCOLAX Take 17 g by mouth 2 (two) times daily. What changed: when to take this   potassium chloride SA 20 MEQ tablet Commonly known as: K-DUR Take 20 mEq by mouth daily.   psyllium 58.6 % packet Commonly known as: METAMUCIL Take 1 packet by mouth daily.   senna-docusate 8.6-50 MG tablet Commonly known as: Senokot S Take 1 tablet by mouth at bedtime.   simethicone 125 MG chewable tablet Commonly known as: MYLICON Chew 530 mg by mouth every 6 (six) hours as needed for flatulence.   simvastatin 20 MG tablet Commonly known as: ZOCOR Take 20 mg by mouth at bedtime.   spironolactone 100 MG tablet Commonly known as: ALDACTONE Take 1 tablet (100 mg total) by mouth daily. Start  taking on: May 04, 2019   torsemide 20 MG tablet Commonly known as: DEMADEX Take 20 mg by mouth daily as needed for fluid.       Discharge Instructions: Please refer to Patient Instructions section of EMR for full details.  Patient was counseled important signs and symptoms that should prompt return to medical care, changes in medications, dietary instructions, activity restrictions, and follow up appointments.   Follow-Up Appointments: Follow up with PCP and GI.    Gerlene Fee, DO 05/03/2019, 3:52 PM PGY-1, [place]

## 2019-05-02 NOTE — Progress Notes (Signed)
[facility] Gastroenterology Progress Note  [name] 58 y.o. 11/16/1960   Subjective: Complains of ongoing abdominal pain that is similar to her chronic abdominal pain.  Objective: Vital signs: Vitals:   05/02/19 0928 05/02/19 1345  BP:  139/71  Pulse:  78  Resp:  15  Temp:  99.5 F (37.5 C)  SpO2: 96% 97%    Physical Exam: Gen: lethargic, obese, mild acute distress  HEENT: anicteric sclera CV: RRR Chest: CTA B Abd: diffuse tenderness with guarding, +distention, decreased bowel sounds Ext: no edema  Lab Results: Recent Labs    04/30/19 1740 05/01/19 0116 05/01/19 0343 05/02/19 0455  NA 139  --  136  --   K 3.6  --  3.9  --   CL 99  --  97*  --   CO2 30  --  28  --   GLUCOSE 104*  --  87  --   BUN 6  --  5*  --   CREATININE 0.74  --  0.71  --   CALCIUM 8.6*  --  8.4*  --   MG  --  1.5*  --  1.8   Recent Labs    04/30/19 1740  AST 29  ALT 16  ALKPHOS 77  BILITOT 0.6  PROT 7.7  ALBUMIN 3.4*   Recent Labs    04/30/19 1740 05/01/19 0343  WBC 5.5 5.9  HGB 11.6* 11.7*  HCT 39.7 40.1  MCV 85.0 85.7  PLT 156 158      Assessment/Plan: NASH cirrhosis with therapeutic paracentesis planned for today. Chronic abdominal pain. Minimize pain meds due to constipation. No plans for inpt colonoscopy and defer timing to Dr. Oletta Wiggins as an outpt. Supportive care. Will f/u.   [name] 05/02/2019, 2:18 PM  Questions please call [phone] ID: [name], female   DOB: 1961/05/20, 58 y.o.   MRN: [id number]

## 2019-05-02 NOTE — Progress Notes (Addendum)
Family Medicine Teaching Service Daily Progress Note Intern Pager: [phone]  Patient name: [name] Medical record number: [id number] Date of birth: 05/05/1961 Age: 58 y.o. Gender: female  Primary Care Provider: Shirline Frees, MD Consultants: GI Code Status: Full   Pt Overview and Major Events to Date:  Admitted 8/16.  Will go for paracentesis today.  He experienced some constipation last night and has not resolved.  MiraLAX was given without resolution.  Spoke with patient and will reassess after paracentesis.  Patient should be able to go home in stable condition today pending recovery from paracentesis.  Assessment and Plan: [name] is a 58 y.o. female presenting with abdominal pain for one month with increasing in the past week. PMH is significant for Cirrhosis, NAFLD, T2DM, HTN, HLD, CAD, COPD, Chronic Back Pain, Anxiety, Depression, GERD, OSA.  Ascites 2/2 Cirrhosis from NAFLD She is complaining of diffuse abdominal pain.  Her abdomen is mildly distended.  She has gained 2 pounds since yesterday.  Yesterday's output was 270 mL.  No output charted.  CT of abdomen showed moderate ascites. - Paracentesis by IR on today. -Furosemide 20 mg has been started - Daily vitals and CBC to monitor for SBP   T2DM. On Metformin 500mg  BID per day at home. Glucose well controlled 101 this morning. A1c yesterday was 5.0% - Holding home metformin - sSSI with CBGs - Carb modified diet  HTN. Chronic. SBP range of 120-156, DBP 60-91 since admission. She takes amlodipine 10mg  daily, benazepril 5mg  daily, and toresmide 20mg  daily. Patient had not been taking toresmide for the past 4 weeks. - Cont Amlodipine 10mg  daily - Cont Benzapril 5mg  daily - Torsemide 20mg  daily  COPD.  Was put on 3 L nasal cannula overnight.  Was on room air this morning breathing and talking fine satting at 99% on room air.  Has albuterol inhaler as needed and Symbicort daily at home. - Cont Albuterol prn - Daily  Dulera BID -Continue to wean to room air  Chronic Back Pain. Chronic. On Oxy 10mg  every 4 hours at home. On Flexeril 10mg  TID prn, and mobic. - Cont Oxycodone IR 10mg  q4 scheduled - Tylenol prn  - On scheduled Senna 2 tabs BID and Miralax once per day   CAD. Chronic, no current symptoms. Patient has history of cath in 2010 with L circumflex 60-70% blockage, normal LVEF of 54% on 2018 Myospec scan. EKG with sinus rhythm on admission. - If chest pain develops recommend troponins x 3 and EKG  HLD. Chronic. On Simvastatin 20mg  at home. - Cont home Simvastatin at 20mg  daily - Lipid panel WNL.  GERD. Chronic. Well controlled. On protonix 40mg  BID at home. - Cont Protonix 40mg  BID  Anxiety/Depression. Chronic. Lexapro 20mg  daily, Xanax 1mg  in am and 2mg  at bedtime. - Xanax 1mg  morning and 2mg  beditime PRN. Has only taken 2 mg last night @2111 .  Discontinue current regimen.  Start Xanax 1 mg twice daily. - Lexapro 20mg  daily  Morbid Obesity. Chronic. Patient BMI of 40 with CAD,T2DM, and HLD - Carb modified diet  OSA. Does not use her CPAP. - CPAP QHS while in the hospital if she begins to desat at night. -  Using 3L nasal cannula for symptomatic shortness of breath. Was decreased down form 2L. Was 99% on room air. Wean to room air.    FEN/GI: Protonix 40 mg twice daily PPx: Lovenox  Disposition: Home 05/02/2019.  Subjective:  She is sitting in bed eating breakfast.  She  is currently experiencing abdominal discomfort that she attributes to constipation of 3 days.  She states that MiraLAX did not help.  She would like an enema.    Today her concern is being afraid of the pain after her paracentesis.  She wants to be sure that she will have adequate pain management after the procedure.  We discussed this in length.   She states she is using oxygen due to her shortness of breath from the fluid on her abdomen.  She says it is more helpful when she is laying down.  Objective: Temp:   [97.9 F (36.6 C)-98.3 F (36.8 C)] 97.9 F (36.6 C) (08/17 0529) Pulse Rate:  [66-72] 66 (08/17 0529) Resp:  [13-19] 19 (08/17 0529) BP: (120-141)/(60-68) 141/64 (08/17 0529) SpO2:  [98 %-100 %] 100 % (08/17 0529) FiO2 (%):  [28 %] 28 % (08/16 0735) Weight:  [97.1 kg] 97.1 kg (08/17 0500)   Physical Exam: General: Alert, oriented, age-appropriate, in no acute distress Cardiovascular: Regular rate and rhythm.  No murmurs, gallops or rubs Respiratory: CTAB.  Normal effort. No rales or rhonchi. Abdomen: Grossly distended.  Grossly tender to palpation.  Over active bowel sounds.  Extremities: Warm to touch.  No peripheral edema.  Peripheral arteries palpable.  Laboratory: Recent Labs  Lab 04/30/19 1740 05/01/19 0343  WBC 5.5 5.9  HGB 11.6* 11.7*  HCT 39.7 40.1  PLT 156 158   Recent Labs  Lab 04/30/19 1740 05/01/19 0343  NA 139 136  K 3.6 3.9  CL 99 97*  CO2 30 28  BUN 6 5*  CREATININE 0.74 0.71  CALCIUM 8.6* 8.4*  PROT 7.7  --   BILITOT 0.6  --   ALKPHOS 77  --   ALT 16  --   AST 29  --   GLUCOSE 104* 87     Imaging/Diagnostic Tests: EXAM: CT ABDOMEN AND PELVIS WITH CONTRAST  IMPRESSION: 1. Moderate volume abdominal ascites. 2. Cirrhosis with stigmata of portal hypertension.    Gerlene Fee, DO 05/02/2019, 6:49 AM PGY-1, [place] Intern pager: [phone], text pages welcome

## 2019-05-02 NOTE — Procedures (Signed)
PROCEDURE SUMMARY:  Successful image-guided paracentesis from the right lateral abdomen.  Yielded 3.1 liters of hazy amber fluid.  No immediate complications.  EBL < 5 mL. Patient tolerated well.   Specimen was sent for labs.  Claris Pong Taija Mathias PA-C 05/02/2019 3:25 PM

## 2019-05-02 NOTE — Progress Notes (Signed)
Patient pain unrelieved by the 10 mg of oxy IR.  Patient states abdominal pain is still 10/10. Notified Dr. Rosita Fire .  Will await for new orders and continue to monitor the patient

## 2019-05-02 NOTE — Progress Notes (Addendum)
FPTS Interim Progress Note  S: Paged that patient was experiencing 10/10 abdominal pain that persisted despite PRN 10 mg of oxycodone.  Reported to bedside, patient stating that her pain has worsened.  Patient reports 2 bowel movements after receiving MiraLAX and and a smog enema.  Patient reports that her bowel movements are normal-appearing with no blood and are soft.  Patient reports passing flatus.  Patient denies nausea or emesis.  Patient states that her abdominal pain is diffuse and feels like labor cramping.  Patient reports that this is worse and when she feels like these during prior admissions she is usually given 2 mg of Dilaudid to help ease her pain.   Update: 12:30- received page that patient's abdominal pain had returned to 10/10. Patient denies SOB, nausea, vomiting, or chest pain. Patient states that Dilaudid helped for about 10 minutes before her pain resumed.   O: BP 139/72 (BP Location: Left Arm)   Pulse 69   Temp 99 F (37.2 C) (Oral)   Resp 18   Ht 5' 2"  (1.575 m)   Wt 97.1 kg   SpO2 94%   BMI 39.15 kg/m    Updated Vitals Temp 98.2 HR: 71 RR 18 BP 138/71 MAP 89 92% on RA   General: Obese female rearranging personal items, standing upon entering the room patient is conversant and lies down for exam patient appears to be in no acute distress patient saturating well on room air  Abdominal: Distended abdomen, palpable liver edge, intra-abdominal palpable scar tissue, tenderness to deep palpation in all 4 quadrants, positive bowel sounds in all 4 quadrants, no tympanic sounds, abdomen is soft  A/P: Acute on Chronic Diffuse abdominal pain: Patient counseled that if she begins to feel nauseous or bowel movement change in appearance or she becomes constipated to please notify MD via page.  Patient is afebrile and without signs of obstruction we will continue to monitor pain -Patient given one-time dose of 0.5 mg Dilaudid -second dose of 0.5 Dilaudid given  -EKG & CT  Abd and Pelvis ordered to rule out atypical MI presentation or developing infectious process -EKG with prolonged QTC of 602, patient now on cardiac monitoring  -Vitals continue to be stable   [name] Klein, MD 05/03/2019, 12:47 AM PGY-1, [place] Medicine Service pager [phone]

## 2019-05-03 ENCOUNTER — Inpatient Hospital Stay (HOSPITAL_COMMUNITY): Payer: Medicare Other

## 2019-05-03 ENCOUNTER — Encounter (HOSPITAL_COMMUNITY): Payer: Self-pay | Admitting: Radiology

## 2019-05-03 LAB — GLUCOSE, CAPILLARY
Glucose-Capillary: 186 mg/dL — ABNORMAL HIGH (ref 70–99)
Glucose-Capillary: 239 mg/dL — ABNORMAL HIGH (ref 70–99)

## 2019-05-03 LAB — ALBUMIN, PLEURAL OR PERITONEAL FLUID: Albumin, Fluid: 2.5 g/dL

## 2019-05-03 LAB — BODY FLUID CELL COUNT WITH DIFFERENTIAL
Eos, Fluid: 0 %
Lymphs, Fluid: 93 %
Monocyte-Macrophage-Serous Fluid: 5 % — ABNORMAL LOW (ref 50–90)
Neutrophil Count, Fluid: 2 % (ref 0–25)
Total Nucleated Cell Count, Fluid: 640 cu mm (ref 0–1000)

## 2019-05-03 LAB — VITAMIN D 25 HYDROXY (VIT D DEFICIENCY, FRACTURES): Vit D, 25-Hydroxy: 27.6 ng/mL — ABNORMAL LOW (ref 30.0–100.0)

## 2019-05-03 MED ORDER — IOHEXOL 300 MG/ML  SOLN
100.0000 mL | Freq: Once | INTRAMUSCULAR | Status: AC | PRN
  Administered 2019-05-03: 100 mL via INTRAVENOUS

## 2019-05-03 MED ORDER — BACLOFEN 10 MG PO TABS: 10.0000 mg | ORAL_TABLET | Freq: Three times a day (TID) | ORAL | 0 refills | Status: DC | PRN

## 2019-05-03 MED ORDER — SPIRONOLACTONE 100 MG PO TABS: 100.0000 mg | ORAL_TABLET | Freq: Every day | ORAL | 0 refills | Status: DC

## 2019-05-03 MED ORDER — HYDROMORPHONE HCL 1 MG/ML IJ SOLN
0.5000 mg | Freq: Once | INTRAMUSCULAR | Status: AC
  Administered 2019-05-03: 0.5 mg via INTRAVENOUS
  Filled 2019-05-03: qty 0.5

## 2019-05-03 MED ORDER — PROCHLORPERAZINE MALEATE 5 MG PO TABS
5.0000 mg | ORAL_TABLET | Freq: Four times a day (QID) | ORAL | Status: DC | PRN
  Administered 2019-05-03: 5 mg via ORAL
  Filled 2019-05-03 (×2): qty 1

## 2019-05-03 NOTE — Discharge Instructions (Signed)
Ascites  Ascites is a collection of too much fluid in the abdomen. Ascites can range from mild to severe. If ascites is not treated, it can get worse. What are the causes? This condition may be caused by:  A liver condition called cirrhosis. This is the most common cause of ascites.  Long-term (chronic) or alcoholic hepatitis.  Infection or inflammation in the abdomen.  Cancer in the abdomen.  Heart failure.  Kidney disease.  Inflammation of the pancreas.  Clots in the veins of the liver. What are the signs or symptoms? Symptoms of this condition include:  A feeling of fullness in the abdomen. This is common.  An increase in the size of the abdomen or waist.  Swelling in the legs.  Swelling of the scrotum (in men).  Difficulty breathing.  Pain in the abdomen.  Sudden weight gain. If the condition is mild, you may not have symptoms. How is this diagnosed? This condition is diagnosed based on your medical history and a physical exam. Your health care provider may order imaging tests, such as an ultrasound or CT scan of your abdomen. How is this treated? Treatment for this condition depends on the cause of the ascites. It may include:  Taking a pill to make you urinate. This is called a water pill (diuretic pill).  Strictly reducing your salt (sodium) intake. Salt can cause extra fluid to be kept (retained) in the body, and this makes ascites worse.  Having a procedure to remove fluid from your abdomen (paracentesis).  Having a procedure that connects two of the major veins within your liver and relieves pressure on your liver. This is called a TIPS procedure (transjugular intrahepatic portosystemic shunt procedure).  Placement of a drainage catheter (peritoneovenous shunt) to manage the extra fluid in the abdomen. Ascites may go away or improve when the condition that caused it is treated. Follow these instructions at home:  Keep track of your weight. To do  this, weigh yourself at the same time every day and write down your weight.  Keep track of how much you drink and any changes in how much or how often you urinate.  Follow any instructions that your health care provider gives you about how much to drink.  Try not to eat salty (high-sodium) foods.  Take over-the-counter and prescription medicines only as told by your health care provider.  Keep all follow-up visits as told by your health care provider. This is important.  Report any changes in your health to your health care provider, especially if you develop new symptoms or your symptoms get worse. Contact a health care provider if:  You gain more than 3 lb (1.36 kg) in 3 days.  Your waist size increases.  You have new swelling in your legs.  The swelling in your legs gets worse. Get help right away if:  You have a fever.  You are confused.  You have new or worsening breathing trouble.  You have new or worsening pain in your abdomen.  You have new or worsening swelling in the scrotum (in men). Summary  Ascites is a collection of too much fluid in the abdomen.  Ascites may be caused by various conditions, such as cirrhosis, hepatitis, cancer, or congestive heart failure.  Symptoms may include swelling of the abdomen and other areas due to extra fluid in the body.  Treatments may involve dietary changes, medicines, or procedures. This information is not intended to replace advice given to you by your health  care provider. Make sure you discuss any questions you have with your health care provider. Document Released: 09/01/2005 Document Revised: 08/03/2018 Document Reviewed: 05/14/2017 Elsevier Patient Education  2020 [place] you for allowing [place] to participate in your care!    You were admitted for abdominal ascites. We drained your fluid. You need to continue to take your torsemide 20mg  daily and spironolactone 100mg  daily. Please be sure to follow up with  your primary care physician and gastroenterologist.  If you experience worsening of your admission symptoms, develop shortness of breath, life threatening emergency, suicidal or homicidal thoughts you must seek medical attention immediately by calling 911 or calling your MD immediately  if symptoms less severe.

## 2019-05-03 NOTE — Progress Notes (Signed)
[facility] Gastroenterology Progress Note  [name] 58 y.o. [birth year]   Subjective: Feels ok. Reports abdominal pain last night.  Objective: Vital signs: Vitals:   05/03/19 0525 05/03/19 0801  BP: 126/75   Pulse: 71   Resp: 18   Temp: 98.4 F (36.9 C)   SpO2: 96% 95%    Physical Exam: Gen: alert, no acute distress  HEENT: anicteric sclera CV: RRR Chest: CTA B Abd: diffuse tenderness with guarding, soft, nondistended, +BS, obese Ext: no edema  Lab Results: Recent Labs    04/30/19 1740 05/01/19 0116 05/01/19 0343 05/02/19 0455  NA 139  --  136  --   K 3.6  --  3.9  --   CL 99  --  97*  --   CO2 30  --  28  --   GLUCOSE 104*  --  87  --   BUN 6  --  5*  --   CREATININE 0.74  --  0.71  --   CALCIUM 8.6*  --  8.4*  --   MG  --  1.5*  --  1.8   Recent Labs    04/30/19 1740  AST 29  ALT 16  ALKPHOS 77  BILITOT 0.6  PROT 7.7  ALBUMIN 3.4*   Recent Labs    04/30/19 1740 05/01/19 0343  WBC 5.5 5.9  HGB 11.6* 11.7*  HCT 39.7 40.1  MCV 85.0 85.7  PLT 156 158      Assessment/Plan: NASH cirrhosis - s/p therapeutic paracentesis yesterday with 3.1 L removed (negative for spontaneous bacterial peritonitis). Stable to go home today from a GI standpoint. Low sodium diet. F/U with Dr. Oletta Lamas in 3-4 weeks.    [name] 05/03/2019, 12:38 PM  Questions please call [phone] ID: [name], female   DOB: 08/06/1961, 58 y.o.   MRN: [id number]

## 2019-05-03 NOTE — Plan of Care (Signed)

## 2019-05-03 NOTE — Progress Notes (Addendum)
Family Medicine Teaching Service Daily Progress Note Intern Pager: [phone]  Patient name: [name] Medical record number: [id number] Date of birth: [birth year] Age: 58 y.o. Gender: female  Primary Care Provider: Shirline Frees, MD Consultants: GI Code Status: Full   Pt Overview and Major Events to Date:  Admitted 8/16.  Paracentesis 8/17.  Patient is able to go home today.  Assessment and Plan: [name] is a 58 y.o. female presenting with abdominal pain for one month with increasing in the past week. PMH is significant for Cirrhosis, NAFLD, T2DM, HTN, HLD, CAD, COPD, Chronic Back Pain, Anxiety, Depression, GERD, OSA.  Ascites 2/2 Cirrhosis from NAFLD She has received 0.5 Dilaudid x2 overnight. She has lost 9 pounds since yesterday.  Yesterday's intake was 493 mL.  No output charted.  Paracentesis yesterday yielded 3.1 L of hazy amber fluid.  Fluid was cultured no growth.  Gram stain showed abundant white blood cells predominant mononuclear mononuclear.  No organisms seen. SAAG <1.1 - Discharge today. -Furosemide 20 mg  - Daily vitals and CBC to monitor for SBP   T2DM. On Metformin 500mg  BID per day at home. Glucose controlled 137 last night. A1c 7/16 was 5.0%. - Holding home metformin - sSSI with CBGs - Carb modified diet  HTN. Chronic. SBP range of 126-139, DBP 71-76. She takes amlodipine 10mg  daily, benazepril 5mg  daily, and toresmide 20mg  daily.  - Cont Amlodipine 10mg  daily - Cont Benzapril 5mg  daily - Torsemide 20mg  daily  COPD.  Was on room air overnight 95% saturation.  Has albuterol inhaler as needed and Symbicort daily at home. - Cont Albuterol prn - Daily Dulera BID -Continue on room air  Chronic Back Pain. Chronic. On Oxy 10mg  every 4 hours at home. On Flexeril 10mg  TID prn, and mobic. - Cont Oxycodone IR 10mg  q4 scheduled - Tylenol prn  - On scheduled Senna 2 tabs BID and Miralax once per day   CAD. Chronic, no current symptoms. Patient has history  of cath in 2010 with L circumflex 60-70% blockage, normal LVEF of 54% on 2018 Myospec scan. EKG with sinus rhythm on admission. - If chest pain develops recommend troponins x 3 and EKG  HLD. Chronic. On Simvastatin 20mg  at home. - Cont home Simvastatin at 20mg  daily - Lipid panel WNL.  GERD. Chronic. Well controlled. On protonix 40mg  BID at home. - Cont Protonix 40mg  BID  Anxiety/Depression. Chronic. Lexapro 20mg  daily, Xanax 1mg  in am and 2mg  at bedtime. - Xanax 1mg  twice daily  - Lexapro 20mg  daily - Therapy recommended  Morbid Obesity. Chronic. Patient BMI of 40 with CAD,T2DM, and HLD - Carb modified diet  OSA. Does not use her CPAP. - CPAP QHS while in the hospital if she begins to desat at night. -95% on room air   FEN/GI: Protonix 40 mg twice daily PPx: Lovenox  Disposition: Home 05/03/2019.  Subjective:  She is in bed resting. State she feels better and does not currently have pain. Since last night she has had 2 bowel movements without taking the laxative. She is ready to go home today.  Objective: Temp:  [98.2 F (36.8 C)-99.5 F (37.5 C)] 98.4 F (36.9 C) (08/18 0525) Pulse Rate:  [69-78] 71 (08/18 0525) Resp:  [15-18] 18 (08/18 0525) BP: (124-139)/(52-76) 126/75 (08/18 0525) SpO2:  [92 %-100 %] 95 % (08/18 0801) Weight:  [93.2 kg] 93.2 kg (08/18 0707)   Physical Exam: General: Appears well, no acute distress. Age appropriate. Cardiac: RRR, normal heart sounds, no  murmurs Respiratory: CTAB, normal effort Abdomen: soft, nontender, nondistended Extremities: No edema or cyanosis. Skin: Warm and dry, no rashes noted Neuro: alert and oriented, no focal deficits Psych: normal affect   Laboratory: Recent Labs  Lab 04/30/19 1740 05/01/19 0343  WBC 5.5 5.9  HGB 11.6* 11.7*  HCT 39.7 40.1  PLT 156 158   Recent Labs  Lab 04/30/19 1740 05/01/19 0343  NA 139 136  K 3.6 3.9  CL 99 97*  CO2 30 28  BUN 6 5*  CREATININE 0.74 0.71  CALCIUM 8.6*  8.4*  PROT 7.7  --   BILITOT 0.6  --   ALKPHOS 77  --   ALT 16  --   AST 29  --   GLUCOSE 104* 87   Overnight QTc 602 Today QTc 430  Imaging/Diagnostic Tests:  EXAM: ULTRASOUND GUIDED DIAGNOSTIC AND THERAPEUTIC PARACENTESIS IMPRESSION: Successful ultrasound-guided paracentesis yielding 3.1 L of peritoneal fluid.  EXAM: CT ABDOMEN AND PELVIS WITH CONTRAST IMPRESSION: 1. Decrease in abdominopelvic ascites after paracentesis. No evidence of complication. 2. Cirrhosis and splenomegaly. 3. Bilateral L5 pars interarticularis defects with grade 1 anterolisthesis of L5 on S1.     Gerlene Fee, DO 05/03/2019, 7:45 AM PGY-1, [place] Intern pager: [phone], text pages welcome

## 2019-05-03 NOTE — Progress Notes (Signed)
Discharge instructions given with stated understanding.  Patient waiting for transportation home at this time 

## 2019-05-06 ENCOUNTER — Other Ambulatory Visit: Payer: Self-pay | Admitting: Family Medicine

## 2019-05-10 LAB — CULTURE, BODY FLUID W GRAM STAIN -BOTTLE: Culture: NO GROWTH

## 2019-06-03 ENCOUNTER — Other Ambulatory Visit: Payer: Self-pay | Admitting: Gastroenterology

## 2019-06-03 DIAGNOSIS — R188 Other ascites: Secondary | ICD-10-CM

## 2019-06-03 DIAGNOSIS — R1907 Generalized intra-abdominal and pelvic swelling, mass and lump: Secondary | ICD-10-CM

## 2019-06-10 ENCOUNTER — Emergency Department (HOSPITAL_COMMUNITY)
Admission: EM | Admit: 2019-06-10 | Discharge: 2019-06-11 | Disposition: A | Discharge status: Home / Self Care | Payer: Medicare Other | Attending: Emergency Medicine | Admitting: Emergency Medicine

## 2019-06-10 ENCOUNTER — Encounter (HOSPITAL_COMMUNITY): Payer: Self-pay

## 2019-06-10 ENCOUNTER — Other Ambulatory Visit: Payer: Self-pay

## 2019-06-10 DIAGNOSIS — Z7984 Long term (current) use of oral hypoglycemic drugs: Secondary | ICD-10-CM | POA: Insufficient documentation

## 2019-06-10 DIAGNOSIS — E119 Type 2 diabetes mellitus without complications: Secondary | ICD-10-CM | POA: Insufficient documentation

## 2019-06-10 DIAGNOSIS — Z888 Allergy status to other drugs, medicaments and biological substances status: Secondary | ICD-10-CM | POA: Diagnosis not present

## 2019-06-10 DIAGNOSIS — Z20828 Contact with and (suspected) exposure to other viral communicable diseases: Secondary | ICD-10-CM | POA: Insufficient documentation

## 2019-06-10 DIAGNOSIS — K7460 Unspecified cirrhosis of liver: Secondary | ICD-10-CM

## 2019-06-10 DIAGNOSIS — I10 Essential (primary) hypertension: Secondary | ICD-10-CM | POA: Insufficient documentation

## 2019-06-10 DIAGNOSIS — I2510 Atherosclerotic heart disease of native coronary artery without angina pectoris: Secondary | ICD-10-CM | POA: Diagnosis not present

## 2019-06-10 DIAGNOSIS — J449 Chronic obstructive pulmonary disease, unspecified: Secondary | ICD-10-CM | POA: Insufficient documentation

## 2019-06-10 DIAGNOSIS — Z880 Allergy status to penicillin: Secondary | ICD-10-CM | POA: Diagnosis not present

## 2019-06-10 DIAGNOSIS — Z79899 Other long term (current) drug therapy: Secondary | ICD-10-CM | POA: Insufficient documentation

## 2019-06-10 DIAGNOSIS — K7031 Alcoholic cirrhosis of liver with ascites: Secondary | ICD-10-CM | POA: Diagnosis not present

## 2019-06-10 DIAGNOSIS — F17210 Nicotine dependence, cigarettes, uncomplicated: Secondary | ICD-10-CM | POA: Diagnosis not present

## 2019-06-10 DIAGNOSIS — E782 Mixed hyperlipidemia: Secondary | ICD-10-CM | POA: Diagnosis not present

## 2019-06-10 DIAGNOSIS — Z9115 Patient's noncompliance with renal dialysis: Secondary | ICD-10-CM | POA: Diagnosis not present

## 2019-06-10 DIAGNOSIS — Z9114 Patient's other noncompliance with medication regimen: Secondary | ICD-10-CM

## 2019-06-10 DIAGNOSIS — R1084 Generalized abdominal pain: Secondary | ICD-10-CM | POA: Diagnosis present

## 2019-06-10 LAB — COMPREHENSIVE METABOLIC PANEL
ALT: 19 U/L (ref 0–44)
AST: 30 U/L (ref 15–41)
Albumin: 3.4 g/dL — ABNORMAL LOW (ref 3.5–5.0)
Alkaline Phosphatase: 99 U/L (ref 38–126)
Anion gap: 11 (ref 5–15)
BUN: 13 mg/dL (ref 6–20)
CO2: 32 mmol/L (ref 22–32)
Calcium: 8.6 mg/dL — ABNORMAL LOW (ref 8.9–10.3)
Chloride: 93 mmol/L — ABNORMAL LOW (ref 98–111)
Creatinine, Ser: 1.03 mg/dL — ABNORMAL HIGH (ref 0.44–1.00)
GFR calc Af Amer: >60 mL/min (ref >60)
GFR calc non Af Amer: 59 mL/min — ABNORMAL LOW (ref >60)
Glucose, Bld: 108 mg/dL — ABNORMAL HIGH (ref 70–99)
Potassium: 3.5 mmol/L (ref 3.5–5.1)
Sodium: 136 mmol/L (ref 135–145)
Total Bilirubin: 0.5 mg/dL (ref 0.3–1.2)
Total Protein: 8.4 g/dL — ABNORMAL HIGH (ref 6.5–8.1)

## 2019-06-10 LAB — CBC
HCT: 41.6 % (ref 36.0–46.0)
Hemoglobin: 12.6 g/dL (ref 12.0–15.0)
MCH: 26.2 pg (ref 26.0–34.0)
MCHC: 30.3 g/dL (ref 30.0–36.0)
MCV: 86.5 fL (ref 80.0–100.0)
Platelets: 206 10*3/uL (ref 150–400)
RBC: 4.81 MIL/uL (ref 3.87–5.11)
RDW: 16.6 % — ABNORMAL HIGH (ref 11.5–15.5)
WBC: 8.8 10*3/uL (ref 4.0–10.5)
nRBC: 0 % (ref 0.0–0.2)

## 2019-06-10 LAB — URINALYSIS, ROUTINE W REFLEX MICROSCOPIC
Bilirubin Urine: NEGATIVE
Glucose, UA: NEGATIVE mg/dL
Hgb urine dipstick: NEGATIVE
Ketones, ur: NEGATIVE mg/dL
Leukocytes,Ua: NEGATIVE
Nitrite: NEGATIVE
Protein, ur: NEGATIVE mg/dL
Specific Gravity, Urine: 1.004 — ABNORMAL LOW (ref 1.005–1.030)
pH: 5 (ref 5.0–8.0)

## 2019-06-10 LAB — LIPASE, BLOOD: Lipase: 34 U/L (ref 11–51)

## 2019-06-10 MED ORDER — SODIUM CHLORIDE 0.9% FLUSH: 3.0000 mL | Freq: Once | INTRAVENOUS | Status: DC

## 2019-06-10 NOTE — ED Triage Notes (Signed)
Pt c.o generalized abd pain and states she needs fluid drained from her abd, hx of same. Pt a.o, ambulatory.

## 2019-06-11 LAB — SARS CORONAVIRUS 2 (TAT 6-24 HRS): SARS Coronavirus 2: NEGATIVE

## 2019-06-11 MED ORDER — HYDROMORPHONE HCL 1 MG/ML IJ SOLN
1.0000 mg | Freq: Once | INTRAMUSCULAR | Status: AC
  Administered 2019-06-11: 1 mg via INTRAMUSCULAR
  Filled 2019-06-11: qty 1
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
## 2019-06-11 NOTE — Discharge Instructions (Signed)
Call [phone] to schedule your outpatient IR paracentesis to have your ascites drained to improve your pain. You would ideally benefit from having this study completed on Monday, 06/13/19.  Discuss your daily medications with your primary care doctor as well as your GI doctor as you are prescribed to be on 2 types of fluid pills to help manage your developing ascites.  Continue your prescribed medications and follow-up with your primary doctor as needed.  You may return to the ED for any new or concerning symptoms.

## 2019-06-13 ENCOUNTER — Ambulatory Visit
Admission: RE | Admit: 2019-06-13 | Discharge: 2019-06-13 | Disposition: A | Discharge status: Home / Self Care | Payer: Medicare Other | Source: Ambulatory Visit | Attending: Gastroenterology | Admitting: Gastroenterology

## 2019-06-13 DIAGNOSIS — R1907 Generalized intra-abdominal and pelvic swelling, mass and lump: Secondary | ICD-10-CM

## 2019-06-13 DIAGNOSIS — R188 Other ascites: Secondary | ICD-10-CM

## 2019-06-14 ENCOUNTER — Encounter (HOSPITAL_COMMUNITY): Payer: Self-pay | Admitting: Emergency Medicine

## 2019-06-14 ENCOUNTER — Emergency Department (HOSPITAL_COMMUNITY): Payer: Medicare Other

## 2019-06-14 ENCOUNTER — Emergency Department (HOSPITAL_COMMUNITY)
Admission: EM | Admit: 2019-06-14 | Discharge: 2019-06-14 | Disposition: A | Discharge status: Home / Self Care | Payer: Medicare Other | Attending: Emergency Medicine | Admitting: Emergency Medicine

## 2019-06-14 DIAGNOSIS — E119 Type 2 diabetes mellitus without complications: Secondary | ICD-10-CM | POA: Diagnosis not present

## 2019-06-14 DIAGNOSIS — Z7984 Long term (current) use of oral hypoglycemic drugs: Secondary | ICD-10-CM | POA: Diagnosis not present

## 2019-06-14 DIAGNOSIS — F17210 Nicotine dependence, cigarettes, uncomplicated: Secondary | ICD-10-CM | POA: Diagnosis not present

## 2019-06-14 DIAGNOSIS — G8929 Other chronic pain: Secondary | ICD-10-CM | POA: Diagnosis not present

## 2019-06-14 DIAGNOSIS — R1084 Generalized abdominal pain: Secondary | ICD-10-CM | POA: Insufficient documentation

## 2019-06-14 DIAGNOSIS — J449 Chronic obstructive pulmonary disease, unspecified: Secondary | ICD-10-CM | POA: Diagnosis not present

## 2019-06-14 DIAGNOSIS — Z79899 Other long term (current) drug therapy: Secondary | ICD-10-CM | POA: Insufficient documentation

## 2019-06-14 DIAGNOSIS — Z20828 Contact with and (suspected) exposure to other viral communicable diseases: Secondary | ICD-10-CM | POA: Diagnosis not present

## 2019-06-14 DIAGNOSIS — I2510 Atherosclerotic heart disease of native coronary artery without angina pectoris: Secondary | ICD-10-CM | POA: Insufficient documentation

## 2019-06-14 LAB — COMPREHENSIVE METABOLIC PANEL
ALT: 17 U/L (ref 0–44)
AST: 29 U/L (ref 15–41)
Albumin: 3.2 g/dL — ABNORMAL LOW (ref 3.5–5.0)
Alkaline Phosphatase: 97 U/L (ref 38–126)
Anion gap: 10 (ref 5–15)
BUN: 11 mg/dL (ref 6–20)
CO2: 26 mmol/L (ref 22–32)
Calcium: 8.8 mg/dL — ABNORMAL LOW (ref 8.9–10.3)
Chloride: 100 mmol/L (ref 98–111)
Creatinine, Ser: 0.79 mg/dL (ref 0.44–1.00)
GFR calc Af Amer: >60 mL/min (ref >60)
GFR calc non Af Amer: >60 mL/min (ref >60)
Glucose, Bld: 127 mg/dL — ABNORMAL HIGH (ref 70–99)
Potassium: 4.4 mmol/L (ref 3.5–5.1)
Sodium: 136 mmol/L (ref 135–145)
Total Bilirubin: 0.4 mg/dL (ref 0.3–1.2)
Total Protein: 7.7 g/dL (ref 6.5–8.1)

## 2019-06-14 LAB — CBC
HCT: 41.5 % (ref 36.0–46.0)
Hemoglobin: 13.2 g/dL (ref 12.0–15.0)
MCH: 27.4 pg (ref 26.0–34.0)
MCHC: 31.8 g/dL (ref 30.0–36.0)
MCV: 86.1 fL (ref 80.0–100.0)
Platelets: 164 10*3/uL (ref 150–400)
RBC: 4.82 MIL/uL (ref 3.87–5.11)
RDW: 16.4 % — ABNORMAL HIGH (ref 11.5–15.5)
WBC: 6.4 10*3/uL (ref 4.0–10.5)
nRBC: 0 % (ref 0.0–0.2)

## 2019-06-14 LAB — URINALYSIS, ROUTINE W REFLEX MICROSCOPIC
Bilirubin Urine: NEGATIVE
Glucose, UA: NEGATIVE mg/dL
Hgb urine dipstick: NEGATIVE
Ketones, ur: NEGATIVE mg/dL
Leukocytes,Ua: NEGATIVE
Nitrite: NEGATIVE
Protein, ur: NEGATIVE mg/dL
Specific Gravity, Urine: 1.01 (ref 1.005–1.030)
pH: 8 (ref 5.0–8.0)

## 2019-06-14 LAB — LIPASE, BLOOD: Lipase: 51 U/L (ref 11–51)

## 2019-06-14 MED ORDER — SODIUM CHLORIDE 0.9% FLUSH: 3.0000 mL | Freq: Once | INTRAVENOUS | Status: DC

## 2019-06-14 MED ORDER — ONDANSETRON HCL 4 MG/2ML IJ SOLN
4.0000 mg | Freq: Once | INTRAMUSCULAR | Status: AC
  Administered 2019-06-14: 17:00:00 4 mg via INTRAVENOUS
  Filled 2019-06-14: qty 2

## 2019-06-14 MED ORDER — IOHEXOL 350 MG/ML SOLN
100.0000 mL | Freq: Once | INTRAVENOUS | Status: AC | PRN
  Administered 2019-06-14: 100 mL via INTRAVENOUS

## 2019-06-14 MED ORDER — HYDROMORPHONE HCL 1 MG/ML IJ SOLN
1.0000 mg | Freq: Once | INTRAMUSCULAR | Status: AC
  Administered 2019-06-14: 1 mg via INTRAVENOUS
  Filled 2019-06-14: qty 1

## 2019-06-14 MED ORDER — MORPHINE SULFATE (PF) 4 MG/ML IV SOLN
4.0000 mg | Freq: Once | INTRAVENOUS | Status: AC
  Administered 2019-06-14: 4 mg via INTRAVENOUS
  Filled 2019-06-14: qty 1
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
## 2019-06-14 NOTE — ED Triage Notes (Signed)
Pt arrives to ED with c/o of generalized  abd pain for over 1 week pt has ultrasound done yesterday and was told to come back to ER for possible admission due to "possible intussusception' on [place].

## 2019-06-14 NOTE — Discharge Instructions (Addendum)
Fortunately no evidence of bowel blockage on CT scan.  Please follow up with your doctor for further care.

## 2019-06-15 LAB — SARS CORONAVIRUS 2 (TAT 6-24 HRS): SARS Coronavirus 2: NEGATIVE

## 2019-07-24 ENCOUNTER — Other Ambulatory Visit: Payer: Self-pay

## 2019-07-24 ENCOUNTER — Emergency Department (HOSPITAL_COMMUNITY)
Admission: EM | Admit: 2019-07-24 | Discharge: 2019-07-25 | Disposition: A | Discharge status: Home / Self Care | Payer: Medicare Other | Attending: Emergency Medicine | Admitting: Emergency Medicine

## 2019-07-24 ENCOUNTER — Encounter (HOSPITAL_COMMUNITY): Payer: Self-pay

## 2019-07-24 DIAGNOSIS — F17210 Nicotine dependence, cigarettes, uncomplicated: Secondary | ICD-10-CM | POA: Insufficient documentation

## 2019-07-24 DIAGNOSIS — K7581 Nonalcoholic steatohepatitis (NASH): Secondary | ICD-10-CM | POA: Insufficient documentation

## 2019-07-24 DIAGNOSIS — Z7984 Long term (current) use of oral hypoglycemic drugs: Secondary | ICD-10-CM | POA: Diagnosis not present

## 2019-07-24 DIAGNOSIS — I10 Essential (primary) hypertension: Secondary | ICD-10-CM | POA: Diagnosis not present

## 2019-07-24 DIAGNOSIS — R109 Unspecified abdominal pain: Secondary | ICD-10-CM | POA: Insufficient documentation

## 2019-07-24 DIAGNOSIS — J449 Chronic obstructive pulmonary disease, unspecified: Secondary | ICD-10-CM | POA: Diagnosis not present

## 2019-07-24 DIAGNOSIS — E119 Type 2 diabetes mellitus without complications: Secondary | ICD-10-CM | POA: Diagnosis not present

## 2019-07-24 DIAGNOSIS — G8929 Other chronic pain: Secondary | ICD-10-CM

## 2019-07-24 DIAGNOSIS — Z79899 Other long term (current) drug therapy: Secondary | ICD-10-CM | POA: Insufficient documentation

## 2019-07-24 DIAGNOSIS — M545 Low back pain: Secondary | ICD-10-CM | POA: Diagnosis not present

## 2019-07-24 DIAGNOSIS — I2510 Atherosclerotic heart disease of native coronary artery without angina pectoris: Secondary | ICD-10-CM | POA: Diagnosis not present

## 2019-07-24 LAB — CBC
HCT: 38.5 % (ref 36.0–46.0)
Hemoglobin: 12.4 g/dL (ref 12.0–15.0)
MCH: 28.4 pg (ref 26.0–34.0)
MCHC: 32.2 g/dL (ref 30.0–36.0)
MCV: 88.1 fL (ref 80.0–100.0)
Platelets: 165 10*3/uL (ref 150–400)
RBC: 4.37 MIL/uL (ref 3.87–5.11)
RDW: 15.2 % (ref 11.5–15.5)
WBC: 7 10*3/uL (ref 4.0–10.5)
nRBC: 0 % (ref 0.0–0.2)

## 2019-07-24 LAB — COMPREHENSIVE METABOLIC PANEL
ALT: 20 U/L (ref 0–44)
AST: 40 U/L (ref 15–41)
Albumin: 3.2 g/dL — ABNORMAL LOW (ref 3.5–5.0)
Alkaline Phosphatase: 95 U/L (ref 38–126)
Anion gap: 10 (ref 5–15)
BUN: 8 mg/dL (ref 6–20)
CO2: 34 mmol/L — ABNORMAL HIGH (ref 22–32)
Calcium: 8.7 mg/dL — ABNORMAL LOW (ref 8.9–10.3)
Chloride: 86 mmol/L — ABNORMAL LOW (ref 98–111)
Creatinine, Ser: 0.98 mg/dL (ref 0.44–1.00)
GFR calc Af Amer: >60 mL/min (ref >60)
GFR calc non Af Amer: >60 mL/min (ref >60)
Glucose, Bld: 121 mg/dL — ABNORMAL HIGH (ref 70–99)
Potassium: 4.6 mmol/L (ref 3.5–5.1)
Sodium: 130 mmol/L — ABNORMAL LOW (ref 135–145)
Total Bilirubin: 0.6 mg/dL (ref 0.3–1.2)
Total Protein: 7.3 g/dL (ref 6.5–8.1)

## 2019-07-24 LAB — LIPASE, BLOOD: Lipase: 28 U/L (ref 11–51)

## 2019-07-24 MED ORDER — SODIUM CHLORIDE 0.9% FLUSH: 3.0000 mL | Freq: Once | INTRAVENOUS | Status: DC

## 2019-07-24 NOTE — ED Triage Notes (Signed)
Patient complains of chronic abdominal and lower back pain that has worsened x 2 days. Taking her pain medication with minimal relief

## 2019-07-25 DIAGNOSIS — R109 Unspecified abdominal pain: Secondary | ICD-10-CM | POA: Diagnosis not present

## 2019-07-25 MED ORDER — DICYCLOMINE HCL 10 MG/ML IM SOLN
20.0000 mg | Freq: Once | INTRAMUSCULAR | Status: AC
  Administered 2019-07-25: 08:00:00 20 mg via INTRAMUSCULAR
  Filled 2019-07-25: qty 2

## 2019-07-25 MED ORDER — OXYCODONE HCL 5 MG PO TABS
10.0000 mg | ORAL_TABLET | Freq: Once | ORAL | Status: AC
  Administered 2019-07-25: 08:00:00 10 mg via ORAL
  Filled 2019-07-25: qty 2

## 2019-07-25 MED ORDER — KETOROLAC TROMETHAMINE 30 MG/ML IJ SOLN
30.0000 mg | Freq: Once | INTRAMUSCULAR | Status: AC
  Administered 2019-07-25: 07:00:00 30 mg via INTRAVENOUS
  Filled 2019-07-25: qty 1

## 2019-07-25 MED ORDER — HYDROMORPHONE HCL 1 MG/ML IJ SOLN
1.0000 mg | Freq: Once | INTRAMUSCULAR | Status: AC
  Administered 2019-07-25: 1 mg via INTRAVENOUS
  Filled 2019-07-25: qty 1

## 2019-07-25 MED ORDER — PROMETHAZINE HCL 25 MG/ML IJ SOLN
12.5000 mg | Freq: Once | INTRAMUSCULAR | Status: AC
  Administered 2019-07-25: 06:00:00 12.5 mg via INTRAVENOUS
  Filled 2019-07-25: qty 1

## 2019-07-25 NOTE — ED Provider Notes (Signed)
06:30: Assumed care of patient from [name] PA-C @ change of shift pending re-assessment & disposition.   Please see prior provider note for full H&P.  Fully patient is a 58 year old female with a history of liver cirrhosis and ascites secondary to [name] presented to the emergency department for an exacerbation of her chronic abdominal pain.   Physical Exam  BP 125/75 (BP Location: Right Arm)   Pulse 80   Temp 98.2 F (36.8 C) (Oral)   Resp 18   SpO2 97%   Physical Exam Vitals signs and nursing note reviewed.  Constitutional:      General: She is not in acute distress.    Appearance: She is well-developed.  HENT:     Head: Normocephalic and atraumatic.  Eyes:     General:        Right eye: No discharge.        Left eye: No discharge.     Conjunctiva/sclera: Conjunctivae normal.  Abdominal:     Comments: Findings consistent w/ ascites.  No focal tenderness to palpation or peritoneal signs.  Musculoskeletal:     Comments: Trace to 1+ symmetric pitting edema to the lower extremities without palpable calf tenderness overlying erythema/warmth.  Neurological:     Mental Status: She is alert.     Comments: Clear speech.   Psychiatric:        Behavior: Behavior normal.        Thought Content: Thought content normal.     ED Course/Procedures     Results for orders placed or performed during the hospital encounter of 07/24/19  Lipase, blood  Result Value Ref Range   Lipase 28 11 - 51 U/L  Comprehensive metabolic panel  Result Value Ref Range   Sodium 130 (L) 135 - 145 mmol/L   Potassium 4.6 3.5 - 5.1 mmol/L   Chloride 86 (L) 98 - 111 mmol/L   CO2 34 (H) 22 - 32 mmol/L   Glucose, Bld 121 (H) 70 - 99 mg/dL   BUN 8 6 - 20 mg/dL   Creatinine, Ser 0.98 0.44 - 1.00 mg/dL   Calcium 8.7 (L) 8.9 - 10.3 mg/dL   Total Protein 7.3 6.5 - 8.1 g/dL   Albumin 3.2 (L) 3.5 - 5.0 g/dL   AST 40 15 - 41 U/L   ALT 20 0 - 44 U/L   Alkaline Phosphatase 95 38 - 126 U/L   Total Bilirubin  0.6 0.3 - 1.2 mg/dL   GFR calc non Af Amer >60 >60 mL/min   GFR calc Af Amer >60 >60 mL/min   Anion gap 10 5 - 15  CBC  Result Value Ref Range   WBC 7.0 4.0 - 10.5 K/uL   RBC 4.37 3.87 - 5.11 MIL/uL   Hemoglobin 12.4 12.0 - 15.0 g/dL   HCT 38.5 36.0 - 46.0 %   MCV 88.1 80.0 - 100.0 fL   MCH 28.4 26.0 - 34.0 pg   MCHC 32.2 30.0 - 36.0 g/dL   RDW 15.2 11.5 - 15.5 %   Platelets 165 150 - 400 K/uL   nRBC 0.0 0.0 - 0.2 %   No results found.  Procedures  MDM   Labs reviewed, fairly similar to prior, mild electrolyte abnormalities as above.  Plan at change of shift is to reassess after Dilaudid and Toradol, likely discharge home for outpatient paracentesis and follow-up with her GI doctor as she does not appear to require emergent paracentesis in the ED. Per prior team would  avoid continued doses of IV narcotics for her prior team, would trial bentyl.   Patient with some improvement, remains uncomfortable, will give bentyl & a dose of her @ home oxycodone with plan to discharge. We discuss GI follow up and calling IR for paracentesis to be scheduled. I discussed results, treatment plan, need for follow-up, and return precautions with the patient. Provided opportunity for questions, patient confirmed understanding and is in agreement with plan. Discharge instructions prepared by prior team.         [name] Dyke, PA-C 07/25/19 0751    [name] Etienne, DO 07/25/19 0800

## 2019-07-25 NOTE — Discharge Instructions (Signed)
Call [phone] to schedule an outpatient IR paracentesis to have your ascites drained if you feel this will improve your pain.  Unfortunately, the emergency department is not the appropriate place for the management of chronic pain.  We recommend continued follow-up with pain management this coming Friday as scheduled.  You may see your primary care doctor or GI doctor in the interim for any ongoing or persistent symptoms.  Continue your daily oxycodone as well as your other daily prescribed medications.
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
## 2019-08-02 ENCOUNTER — Other Ambulatory Visit (HOSPITAL_COMMUNITY): Payer: Self-pay | Admitting: Gastroenterology

## 2019-08-02 ENCOUNTER — Other Ambulatory Visit: Payer: Self-pay | Admitting: Gastroenterology

## 2019-08-02 DIAGNOSIS — R188 Other ascites: Secondary | ICD-10-CM

## 2019-08-03 ENCOUNTER — Other Ambulatory Visit: Payer: Self-pay

## 2019-08-03 ENCOUNTER — Encounter (HOSPITAL_COMMUNITY): Payer: Self-pay | Admitting: Radiology

## 2019-08-03 ENCOUNTER — Ambulatory Visit (HOSPITAL_COMMUNITY)
Admission: RE | Admit: 2019-08-03 | Discharge: 2019-08-03 | Disposition: A | Discharge status: Home / Self Care | Payer: Medicare Other | Source: Ambulatory Visit | Attending: Gastroenterology | Admitting: Gastroenterology

## 2019-08-03 DIAGNOSIS — R188 Other ascites: Secondary | ICD-10-CM | POA: Diagnosis not present

## 2019-08-03 HISTORY — PX: IR PARACENTESIS: IMG2679

## 2019-08-03 MED ORDER — LIDOCAINE HCL 1 % IJ SOLN
INTRAMUSCULAR | Status: AC
  Filled 2019-08-03: qty 20

## 2019-08-03 MED ORDER — LIDOCAINE HCL (PF) 1 % IJ SOLN
INTRAMUSCULAR | Status: DC | PRN
  Administered 2019-08-03: 10 mL

## 2019-08-03 NOTE — Procedures (Signed)
PROCEDURE SUMMARY:  Successful US guided paracentesis from RUQ.  Yielded 1.2 L of clear yellow fluid.  No immediate complications.  Pt tolerated well.   Specimen was not sent for labs.  EBL < 77mL  Ascencion Dike PA-C 08/03/2019 9:38 AM

## 2019-09-12 DIAGNOSIS — M25562 Pain in left knee: Secondary | ICD-10-CM | POA: Insufficient documentation

## 2019-09-23 ENCOUNTER — Other Ambulatory Visit: Payer: Self-pay | Admitting: Gastroenterology

## 2019-09-23 ENCOUNTER — Other Ambulatory Visit: Payer: Self-pay

## 2019-09-23 ENCOUNTER — Telehealth (INDEPENDENT_AMBULATORY_CARE_PROVIDER_SITE_OTHER): Payer: Medicare Other | Admitting: Cardiology

## 2019-09-23 ENCOUNTER — Encounter: Payer: Self-pay | Admitting: Cardiology

## 2019-09-23 VITALS — BP 102/68 | HR 81 | Ht 62.0 in | Wt 211.4 lb

## 2019-09-23 DIAGNOSIS — E7800 Pure hypercholesterolemia, unspecified: Secondary | ICD-10-CM

## 2019-09-23 DIAGNOSIS — I10 Essential (primary) hypertension: Secondary | ICD-10-CM

## 2019-09-23 DIAGNOSIS — I2510 Atherosclerotic heart disease of native coronary artery without angina pectoris: Secondary | ICD-10-CM | POA: Diagnosis not present

## 2019-09-23 MED ORDER — AMLODIPINE BESYLATE 2.5 MG PO TABS: 2.5000 mg | ORAL_TABLET | Freq: Every day | ORAL | 3 refills | Status: DC

## 2019-09-23 NOTE — Patient Instructions (Signed)
Medication Instructions:  Your physician has recommended you make the following change in your medication:  1) DECREASE amlodipine (Norvasc) to 2.5 mg daily.   *If you need a refill on your cardiac medications before your next appointment, please call your pharmacy*  Follow-Up: At [facility], you and your health needs are our priority.  As part of our continuing mission to provide you with exceptional heart care, we have created designated Provider Care Teams.  These Care Teams include your primary Cardiologist (physician) and Advanced Practice Providers (APPs -  Physician Assistants and Nurse Practitioners) who all work together to provide you with the care you need, when you need it.  Your next appointment:   1 year(s)  The format for your next appointment:   In Person  Provider:   You may see Fransico Him, MD or one of the following Advanced Practice Providers on your designated Care Team:    Melina Copa, PA-C  Ermalinda Barrios, PA-C   Other Instructions Follow-up with your primary care provider.
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
## 2019-09-26 ENCOUNTER — Encounter (HOSPITAL_COMMUNITY): Payer: Self-pay | Admitting: Gastroenterology

## 2019-09-29 ENCOUNTER — Other Ambulatory Visit: Payer: Self-pay | Admitting: Gastroenterology

## 2019-09-30 ENCOUNTER — Other Ambulatory Visit (HOSPITAL_COMMUNITY)
Admission: RE | Admit: 2019-09-30 | Discharge: 2019-09-30 | Disposition: A | Discharge status: Home / Self Care | Payer: Medicare Other | Source: Ambulatory Visit | Attending: Gastroenterology | Admitting: Gastroenterology

## 2019-09-30 DIAGNOSIS — Z01812 Encounter for preprocedural laboratory examination: Secondary | ICD-10-CM | POA: Diagnosis present

## 2019-09-30 DIAGNOSIS — Z20822 Contact with and (suspected) exposure to covid-19: Secondary | ICD-10-CM | POA: Insufficient documentation

## 2019-09-30 LAB — SARS CORONAVIRUS 2 (TAT 6-24 HRS): SARS Coronavirus 2: NEGATIVE

## 2019-10-03 NOTE — Progress Notes (Signed)
Pre call done.  Patient to arrive tomorrow between 0800 and 0815.

## 2019-10-04 ENCOUNTER — Encounter (HOSPITAL_COMMUNITY)
Admission: RE | Disposition: A | Discharge status: Home / Self Care | Payer: Self-pay | Source: Home / Self Care | Attending: Gastroenterology

## 2019-10-04 ENCOUNTER — Ambulatory Visit (HOSPITAL_COMMUNITY): Payer: Medicare Other | Admitting: Registered Nurse

## 2019-10-04 ENCOUNTER — Ambulatory Visit (HOSPITAL_COMMUNITY)
Admission: RE | Admit: 2019-10-04 | Discharge: 2019-10-04 | Disposition: A | Discharge status: Home / Self Care | Payer: Medicare Other | Attending: Gastroenterology | Admitting: Gastroenterology

## 2019-10-04 ENCOUNTER — Other Ambulatory Visit: Payer: Self-pay

## 2019-10-04 DIAGNOSIS — F329 Major depressive disorder, single episode, unspecified: Secondary | ICD-10-CM | POA: Insufficient documentation

## 2019-10-04 DIAGNOSIS — F419 Anxiety disorder, unspecified: Secondary | ICD-10-CM | POA: Diagnosis not present

## 2019-10-04 DIAGNOSIS — K7460 Unspecified cirrhosis of liver: Secondary | ICD-10-CM | POA: Insufficient documentation

## 2019-10-04 DIAGNOSIS — K5909 Other constipation: Secondary | ICD-10-CM | POA: Diagnosis not present

## 2019-10-04 DIAGNOSIS — K3189 Other diseases of stomach and duodenum: Secondary | ICD-10-CM | POA: Insufficient documentation

## 2019-10-04 DIAGNOSIS — K766 Portal hypertension: Secondary | ICD-10-CM | POA: Diagnosis not present

## 2019-10-04 DIAGNOSIS — F17210 Nicotine dependence, cigarettes, uncomplicated: Secondary | ICD-10-CM | POA: Insufficient documentation

## 2019-10-04 DIAGNOSIS — K7581 Nonalcoholic steatohepatitis (NASH): Secondary | ICD-10-CM | POA: Diagnosis not present

## 2019-10-04 DIAGNOSIS — Z79899 Other long term (current) drug therapy: Secondary | ICD-10-CM | POA: Insufficient documentation

## 2019-10-04 DIAGNOSIS — G4730 Sleep apnea, unspecified: Secondary | ICD-10-CM | POA: Diagnosis not present

## 2019-10-04 DIAGNOSIS — K2970 Gastritis, unspecified, without bleeding: Secondary | ICD-10-CM | POA: Insufficient documentation

## 2019-10-04 DIAGNOSIS — G4733 Obstructive sleep apnea (adult) (pediatric): Secondary | ICD-10-CM | POA: Insufficient documentation

## 2019-10-04 DIAGNOSIS — K219 Gastro-esophageal reflux disease without esophagitis: Secondary | ICD-10-CM | POA: Insufficient documentation

## 2019-10-04 DIAGNOSIS — G8929 Other chronic pain: Secondary | ICD-10-CM | POA: Diagnosis not present

## 2019-10-04 DIAGNOSIS — E785 Hyperlipidemia, unspecified: Secondary | ICD-10-CM | POA: Diagnosis not present

## 2019-10-04 DIAGNOSIS — K640 First degree hemorrhoids: Secondary | ICD-10-CM | POA: Diagnosis not present

## 2019-10-04 DIAGNOSIS — R1084 Generalized abdominal pain: Secondary | ICD-10-CM | POA: Diagnosis present

## 2019-10-04 DIAGNOSIS — Z791 Long term (current) use of non-steroidal anti-inflammatories (NSAID): Secondary | ICD-10-CM | POA: Insufficient documentation

## 2019-10-04 DIAGNOSIS — Z7984 Long term (current) use of oral hypoglycemic drugs: Secondary | ICD-10-CM | POA: Insufficient documentation

## 2019-10-04 DIAGNOSIS — Z86010 Personal history of colonic polyps: Secondary | ICD-10-CM | POA: Diagnosis not present

## 2019-10-04 DIAGNOSIS — E119 Type 2 diabetes mellitus without complications: Secondary | ICD-10-CM | POA: Diagnosis not present

## 2019-10-04 DIAGNOSIS — I2510 Atherosclerotic heart disease of native coronary artery without angina pectoris: Secondary | ICD-10-CM | POA: Insufficient documentation

## 2019-10-04 DIAGNOSIS — Z6839 Body mass index (BMI) 39.0-39.9, adult: Secondary | ICD-10-CM | POA: Insufficient documentation

## 2019-10-04 DIAGNOSIS — E7800 Pure hypercholesterolemia, unspecified: Secondary | ICD-10-CM | POA: Diagnosis not present

## 2019-10-04 DIAGNOSIS — M1990 Unspecified osteoarthritis, unspecified site: Secondary | ICD-10-CM | POA: Diagnosis not present

## 2019-10-04 DIAGNOSIS — J45909 Unspecified asthma, uncomplicated: Secondary | ICD-10-CM | POA: Diagnosis not present

## 2019-10-04 DIAGNOSIS — J449 Chronic obstructive pulmonary disease, unspecified: Secondary | ICD-10-CM | POA: Insufficient documentation

## 2019-10-04 DIAGNOSIS — I10 Essential (primary) hypertension: Secondary | ICD-10-CM | POA: Insufficient documentation

## 2019-10-04 HISTORY — PX: COLONOSCOPY WITH PROPOFOL: SHX5780

## 2019-10-04 HISTORY — PX: ESOPHAGOGASTRODUODENOSCOPY (EGD) WITH PROPOFOL: SHX5813

## 2019-10-04 LAB — GLUCOSE, CAPILLARY: Glucose-Capillary: 91 mg/dL (ref 70–99)

## 2019-10-04 SURGERY — COLONOSCOPY WITH PROPOFOL
Anesthesia: Monitor Anesthesia Care

## 2019-10-04 MED ORDER — ONDANSETRON HCL 4 MG/2ML IJ SOLN
INTRAMUSCULAR | Status: DC | PRN
  Administered 2019-10-04: 4 mg via INTRAVENOUS

## 2019-10-04 MED ORDER — GLYCOPYRROLATE 0.2 MG/ML IJ SOLN
INTRAMUSCULAR | Status: DC | PRN
  Administered 2019-10-04: .2 mg via INTRAVENOUS

## 2019-10-04 MED ORDER — PROPOFOL 500 MG/50ML IV EMUL
INTRAVENOUS | Status: DC | PRN
  Administered 2019-10-04: 250 ug/kg/min via INTRAVENOUS

## 2019-10-04 MED ORDER — PROPOFOL 10 MG/ML IV BOLUS
INTRAVENOUS | Status: AC
  Filled 2019-10-04: qty 60

## 2019-10-04 MED ORDER — LACTATED RINGERS IV SOLN: INTRAVENOUS | Status: DC | PRN

## 2019-10-04 MED ORDER — SODIUM CHLORIDE 0.9 % IV SOLN: INTRAVENOUS | Status: DC

## 2019-10-04 MED ORDER — LIDOCAINE HCL (CARDIAC) PF 100 MG/5ML IV SOSY
PREFILLED_SYRINGE | INTRAVENOUS | Status: DC | PRN
  Administered 2019-10-04: 100 mg via INTRAVENOUS

## 2019-10-04 MED ORDER — LACTATED RINGERS IV SOLN
INTRAVENOUS | Status: AC | PRN
  Administered 2019-10-04: 1000 mL via INTRAVENOUS

## 2019-10-04 SURGICAL SUPPLY — 25 items

## 2019-10-04 NOTE — Anesthesia Preprocedure Evaluation (Signed)
Anesthesia Evaluation  Patient identified by MRN, date of birth, ID band Patient awake    Reviewed: Allergy & Precautions, NPO status , Patient's Chart, lab work & pertinent test results  History of Anesthesia Complications (+) PONV  Airway Mallampati: II  TM Distance: >3 FB     Dental   Pulmonary shortness of breath, asthma , sleep apnea , COPD, Current Smoker,    breath sounds clear to auscultation       Cardiovascular hypertension, + CAD   Rhythm:Regular Rate:Normal     Neuro/Psych  Headaches,    GI/Hepatic GERD  ,(+) Cirrhosis       ,   Endo/Other  diabetes, Type 2  Renal/GU negative Renal ROS     Musculoskeletal  (+) Arthritis ,   Abdominal   Peds  Hematology negative hematology ROS (+)   Anesthesia Other Findings   Reproductive/Obstetrics                             Anesthesia Physical  Anesthesia Plan  ASA: III  Anesthesia Plan: MAC   Post-op Pain Management:    Induction: Intravenous  PONV Risk Score and Plan: 2 and Propofol infusion, Ondansetron and Treatment may vary due to age or medical condition  Airway Management Planned: Simple Face Mask, Natural Airway and Nasal Cannula  Additional Equipment:   Intra-op Plan:   Post-operative Plan:   Informed Consent: I have reviewed the patients History and Physical, chart, labs and discussed the procedure including the risks, benefits and alternatives for the proposed anesthesia with the patient or authorized representative who has indicated his/her understanding and acceptance.       Plan Discussed with: CRNA and Anesthesiologist  Anesthesia Plan Comments:         Anesthesia Quick Evaluation

## 2019-10-04 NOTE — Anesthesia Procedure Notes (Signed)
Procedure Name: MAC Date/Time: 10/04/2019 9:25 AM Performed by: Lissa Morales, CRNA Pre-anesthesia Checklist: Emergency Drugs available, Patient identified, Suction available, Patient being monitored and Timeout performed Patient Re-evaluated:Patient Re-evaluated prior to induction Oxygen Delivery Method: Simple face mask Preoxygenation: Pre-oxygenation with 100% oxygen Placement Confirmation: positive ETCO2

## 2019-10-04 NOTE — Transfer of Care (Signed)
Immediate Anesthesia Transfer of Care Note  Patient: THOMASENA VANDENHEUVEL  Procedure(s) Performed: COLONOSCOPY WITH PROPOFOL (N/A ) ESOPHAGOGASTRODUODENOSCOPY (EGD) WITH PROPOFOL (N/A )  Patient Location: PACU  Anesthesia Type:MAC  Level of Consciousness: awake, alert , oriented and patient cooperative  Airway & Oxygen Therapy: Patient Spontanous Breathing and Patient connected to face mask oxygen  Post-op Assessment: Report given to RN, Post -op Vital signs reviewed and stable and Patient moving all extremities X 4  Post vital signs: stable  Last Vitals:  Vitals Value Taken Time  BP 94/55 10/04/19 1015  Temp 36.4 C 10/04/19 1008  Pulse 74 10/04/19 1017  Resp 13 10/04/19 1017  SpO2 100 % 10/04/19 1017  Vitals shown include unvalidated device data.  Last Pain:  Vitals:   10/04/19 1008  TempSrc: Oral  PainSc: 9          Complications: No apparent anesthesia complications
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
## 2019-10-04 NOTE — Discharge Instructions (Signed)

## 2019-10-04 NOTE — Anesthesia Postprocedure Evaluation (Signed)
Anesthesia Post Note  Patient: SEVA CHANCY  Procedure(s) Performed: COLONOSCOPY WITH PROPOFOL (N/A ) ESOPHAGOGASTRODUODENOSCOPY (EGD) WITH PROPOFOL (N/A )     Patient location during evaluation: PACU Anesthesia Type: MAC Level of consciousness: awake and alert Pain management: pain level controlled Vital Signs Assessment: post-procedure vital signs reviewed and stable Respiratory status: spontaneous breathing, nonlabored ventilation, respiratory function stable and patient connected to nasal cannula oxygen Cardiovascular status: stable and blood pressure returned to baseline Postop Assessment: no apparent nausea or vomiting Anesthetic complications: no    Last Vitals:  Vitals:   10/04/19 1030 10/04/19 1031  BP: 115/83 115/83  Pulse: 82 76  Resp: 19 20  Temp:    SpO2: 100% 97%    Last Pain:  Vitals:   10/04/19 1030  TempSrc:   PainSc: 6                  Tiajuana Amass

## 2019-10-04 NOTE — Op Note (Signed)
[facility] Patient Name: [name] Procedure Date: 10/04/2019 MRN: [id number] Attending MD: [name] , MD Date of Birth: [birth year] CSN: [id number] Age: 59 Admit Type: Outpatient Procedure:                Upper GI endoscopy Indications:              Generalized abdominal pain Providers:                [name], MD, Angus Seller, William Dalton, Technician, Theodora Blow, Technician Referring MD:             Shirline Frees Medicines:                Propofol per Anesthesia, Monitored Anesthesia Care Complications:            No immediate complications. Estimated Blood Loss:     Estimated blood loss was minimal. Procedure:                Pre-Anesthesia Assessment:                           - Prior to the procedure, a History and Physical                            was performed, and patient medications and                            allergies were reviewed. The patient's tolerance of                            previous anesthesia was also reviewed. The risks                            and benefits of the procedure and the sedation                            options and risks were discussed with the patient.                            All questions were answered, and informed consent                            was obtained. Prior Anticoagulants: The patient has                            taken no previous anticoagulant or antiplatelet                            agents. ASA Grade Assessment: III - A patient with                            severe systemic disease. After reviewing the risks  and benefits, the patient was deemed in                            satisfactory condition to undergo the procedure.                           After obtaining informed consent, the endoscope was                            passed under direct vision. Throughout the                            procedure, the  patient's blood pressure, pulse, and                            oxygen saturations were monitored continuously. The                            [id number]-H190 ([id number]) Olympus gastroscope was                            introduced through the mouth, and advanced to the                            second part of duodenum. The upper GI endoscopy was                            accomplished without difficulty. The patient                            tolerated the procedure well. Scope In: Scope Out: Findings:      The examined esophagus was normal.      There is no endoscopic evidence of varices in the entire esophagus.      The Z-line was regular and was found 42 cm from the incisors.      Mild portal hypertensive gastropathy was found in the gastric body and       in the gastric antrum.      Localized mild inflammation characterized by congestion (edema) and       erythema was found in the prepyloric region of the stomach.      The cardia and gastric fundus were normal on retroflexion.      The examined duodenum was normal. Impression:               - Normal esophagus.                           - Z-line regular, 42 cm from the incisors.                           - Portal hypertensive gastropathy.                           - Gastritis.                           -  Normal examined duodenum.                           - No specimens collected. Moderate Sedation:      Not Applicable - Patient had care per Anesthesia. Recommendation:           - Patient has a contact number available for                            emergencies. The signs and symptoms of potential                            delayed complications were discussed with the                            patient. Return to normal activities tomorrow.                            Written discharge instructions were provided to the                            patient.                           - Follow an antireflux regimen. Procedure Code(s):         --- Professional ---                           [phone], Esophagogastroduodenoscopy, flexible,                            transoral; diagnostic, including collection of                            specimen(s) by brushing or washing, when performed                            (separate procedure) Diagnosis Code(s):        --- Professional ---                           R10.84, Generalized abdominal pain                           K29.70, Gastritis, unspecified, without bleeding                           K76.6, Portal hypertension                           K31.89, Other diseases of stomach and duodenum CPT copyright 2019 American [employer] Association. All rights reserved. The codes documented in this report are preliminary and upon coder review may  be revised to meet current compliance requirements. [name], MD 10/04/2019 10:06:55 AM This report has been signed electronically. Number of Addenda: 0

## 2019-10-04 NOTE — Interval H&P Note (Signed)
History and Physical Interval Note:  10/04/2019 9:30 AM  [name]  has presented today for surgery, with the diagnosis of History of colon polyps and abdominal pain.  The various methods of treatment have been discussed with the patient and family. After consideration of risks, benefits and other options for treatment, the patient has consented to  Procedure(s): COLONOSCOPY WITH PROPOFOL (N/A) ESOPHAGOGASTRODUODENOSCOPY (EGD) WITH PROPOFOL (N/A) as a surgical intervention.  The patient's history has been reviewed, patient examined, no change in status, stable for surgery.  I have reviewed the patient's chart and labs.  Questions were answered to the patient's satisfaction.     [name]

## 2019-10-04 NOTE — Op Note (Signed)
[facility] Patient Name: [name] Procedure Date: 10/04/2019 MRN: [id number] Attending MD: [name] , MD Date of Birth: Mar 13, 1961 CSN: [id number] Age: 59 Admit Type: Outpatient Procedure:                Colonoscopy Indications:              High risk colon cancer surveillance: Personal                            history of colonic polyps, Last colonoscopy: May                            2015 Providers:                [name], MD, Angus Seller, William Dalton, Technician, Theodora Blow, Technician Referring MD:             Shirline Frees Medicines:                Propofol per Anesthesia, Monitored Anesthesia Care Complications:            No immediate complications. Estimated Blood Loss:     Estimated blood loss: none. Procedure:                Pre-Anesthesia Assessment:                           - Prior to the procedure, a History and Physical                            was performed, and patient medications and                            allergies were reviewed. The patient's tolerance of                            previous anesthesia was also reviewed. The risks                            and benefits of the procedure and the sedation                            options and risks were discussed with the patient.                            All questions were answered, and informed consent                            was obtained. Prior Anticoagulants: The patient has                            taken no previous anticoagulant or antiplatelet  agents. ASA Grade Assessment: III - A patient with                            severe systemic disease. After reviewing the risks                            and benefits, the patient was deemed in                            satisfactory condition to undergo the procedure.                           After obtaining informed consent, the colonoscope                      was passed under direct vision. Throughout the                            procedure, the patient's blood pressure, pulse, and                            oxygen saturations were monitored continuously. The                            [id number]-H190DL ([id number]) Olympus pediatric colonscope                            was introduced through the anus and advanced to the                            the cecum, identified by appendiceal orifice and                            ileocecal valve. The colonoscopy was somewhat                            difficult due to a tortuous colon and fair prep.                            Successful completion of the procedure was aided by                            straightening and shortening the scope to obtain                            bowel loop reduction and lavage. The patient                            tolerated the procedure well. The quality of the                            bowel preparation was fair. The terminal ileum,  ileocecal valve, appendiceal orifice, and rectum                            were photographed. Scope In: 9:43:34 AM Scope Out: 9:59:29 AM Scope Withdrawal Time: 0 hours 10 minutes 44 seconds  Total Procedure Duration: 0 hours 15 minutes 55 seconds  Findings:      The perianal and digital rectal examinations were normal.      Internal hemorrhoids were found during retroflexion. The hemorrhoids       were small and Grade I (internal hemorrhoids that do not prolapse).      The exam was otherwise normal throughout the examined colon.      The terminal ileum appeared normal. Impression:               - Preparation of the colon was fair.                           - Internal hemorrhoids.                           - The examined portion of the ileum was normal.                           - No specimens collected. Moderate Sedation:      Not Applicable - Patient had care per Anesthesia. Recommendation:            - Patient has a contact number available for                            emergencies. The signs and symptoms of potential                            delayed complications were discussed with the                            patient. Return to normal activities tomorrow.                            Written discharge instructions were provided to the                            patient.                           - High fiber diet.                           - Repeat colonoscopy in 5 years for surveillance.                           - Continue present medications. Procedure Code(s):        --- Professional ---                           [id number], Colonoscopy, flexible; diagnostic, including  collection of specimen(s) by brushing or washing,                            when performed (separate procedure) Diagnosis Code(s):        --- Professional ---                           Z86.010, Personal history of colonic polyps                           K64.0, First degree hemorrhoids CPT copyright 2019 American Medical Association. All rights reserved. The codes documented in this report are preliminary and upon coder review may  be revised to meet current compliance requirements. [name], MD 10/04/2019 10:14:05 AM This report has been signed electronically. Number of Addenda: 0

## 2019-10-06 ENCOUNTER — Encounter: Payer: Self-pay | Admitting: *Deleted

## 2019-11-19 ENCOUNTER — Encounter (HOSPITAL_COMMUNITY): Payer: Self-pay | Admitting: Emergency Medicine

## 2019-11-19 ENCOUNTER — Emergency Department (HOSPITAL_COMMUNITY): Payer: Medicare Other

## 2019-11-19 ENCOUNTER — Emergency Department (HOSPITAL_COMMUNITY)
Admission: EM | Admit: 2019-11-19 | Discharge: 2019-11-19 | Disposition: A | Discharge status: Home / Self Care | Payer: Medicare Other | Attending: Emergency Medicine | Admitting: Emergency Medicine

## 2019-11-19 ENCOUNTER — Other Ambulatory Visit: Payer: Self-pay

## 2019-11-19 DIAGNOSIS — F17210 Nicotine dependence, cigarettes, uncomplicated: Secondary | ICD-10-CM | POA: Insufficient documentation

## 2019-11-19 DIAGNOSIS — I2510 Atherosclerotic heart disease of native coronary artery without angina pectoris: Secondary | ICD-10-CM | POA: Insufficient documentation

## 2019-11-19 DIAGNOSIS — R1084 Generalized abdominal pain: Secondary | ICD-10-CM | POA: Insufficient documentation

## 2019-11-19 DIAGNOSIS — Z79899 Other long term (current) drug therapy: Secondary | ICD-10-CM | POA: Insufficient documentation

## 2019-11-19 DIAGNOSIS — I10 Essential (primary) hypertension: Secondary | ICD-10-CM | POA: Diagnosis not present

## 2019-11-19 DIAGNOSIS — Z20822 Contact with and (suspected) exposure to covid-19: Secondary | ICD-10-CM | POA: Diagnosis not present

## 2019-11-19 DIAGNOSIS — J449 Chronic obstructive pulmonary disease, unspecified: Secondary | ICD-10-CM | POA: Diagnosis not present

## 2019-11-19 DIAGNOSIS — R519 Headache, unspecified: Secondary | ICD-10-CM | POA: Diagnosis present

## 2019-11-19 DIAGNOSIS — Z7984 Long term (current) use of oral hypoglycemic drugs: Secondary | ICD-10-CM | POA: Insufficient documentation

## 2019-11-19 DIAGNOSIS — E119 Type 2 diabetes mellitus without complications: Secondary | ICD-10-CM | POA: Insufficient documentation

## 2019-11-19 LAB — COMPREHENSIVE METABOLIC PANEL
ALT: 23 U/L (ref 0–44)
AST: 29 U/L (ref 15–41)
Albumin: 3.2 g/dL — ABNORMAL LOW (ref 3.5–5.0)
Alkaline Phosphatase: 85 U/L (ref 38–126)
Anion gap: 8 (ref 5–15)
BUN: 13 mg/dL (ref 6–20)
CO2: 27 mmol/L (ref 22–32)
Calcium: 8.5 mg/dL — ABNORMAL LOW (ref 8.9–10.3)
Chloride: 95 mmol/L — ABNORMAL LOW (ref 98–111)
Creatinine, Ser: 0.91 mg/dL (ref 0.44–1.00)
GFR calc Af Amer: >60 mL/min (ref >60)
GFR calc non Af Amer: >60 mL/min (ref >60)
Glucose, Bld: 147 mg/dL — ABNORMAL HIGH (ref 70–99)
Potassium: 4.8 mmol/L (ref 3.5–5.1)
Sodium: 130 mmol/L — ABNORMAL LOW (ref 135–145)
Total Bilirubin: 0.7 mg/dL (ref 0.3–1.2)
Total Protein: 7.2 g/dL (ref 6.5–8.1)

## 2019-11-19 LAB — URINALYSIS, ROUTINE W REFLEX MICROSCOPIC
Bilirubin Urine: NEGATIVE
Glucose, UA: NEGATIVE mg/dL
Hgb urine dipstick: NEGATIVE
Ketones, ur: NEGATIVE mg/dL
Leukocytes,Ua: NEGATIVE
Nitrite: NEGATIVE
Protein, ur: NEGATIVE mg/dL
Specific Gravity, Urine: 1.003 — ABNORMAL LOW (ref 1.005–1.030)
pH: 7 (ref 5.0–8.0)

## 2019-11-19 LAB — CBC
HCT: 40.7 % (ref 36.0–46.0)
Hemoglobin: 13.3 g/dL (ref 12.0–15.0)
MCH: 29.9 pg (ref 26.0–34.0)
MCHC: 32.7 g/dL (ref 30.0–36.0)
MCV: 91.5 fL (ref 80.0–100.0)
Platelets: 151 10*3/uL (ref 150–400)
RBC: 4.45 MIL/uL (ref 3.87–5.11)
RDW: 14 % (ref 11.5–15.5)
WBC: 7.3 10*3/uL (ref 4.0–10.5)
nRBC: 0 % (ref 0.0–0.2)

## 2019-11-19 LAB — LIPASE, BLOOD: Lipase: 33 U/L (ref 11–51)

## 2019-11-19 LAB — TROPONIN I (HIGH SENSITIVITY)
Troponin I (High Sensitivity): 3 ng/L (ref <18)
Troponin I (High Sensitivity): 3 ng/L (ref <18)

## 2019-11-19 LAB — I-STAT BETA HCG BLOOD, ED (MC, WL, AP ONLY): I-stat hCG, quantitative: <5 m[IU]/mL (ref <5)

## 2019-11-19 MED ORDER — DROPERIDOL 2.5 MG/ML IJ SOLN
0.6125 mg | Freq: Once | INTRAMUSCULAR | Status: AC
  Administered 2019-11-19: 0.6125 mg via INTRAVENOUS
  Filled 2019-11-19: qty 2

## 2019-11-19 MED ORDER — HYDROMORPHONE HCL 1 MG/ML IJ SOLN
1.0000 mg | Freq: Once | INTRAMUSCULAR | Status: AC
  Administered 2019-11-19: 1 mg via INTRAVENOUS
  Filled 2019-11-19: qty 1

## 2019-11-19 MED ORDER — IOHEXOL 300 MG/ML  SOLN
100.0000 mL | Freq: Once | INTRAMUSCULAR | Status: AC | PRN
  Administered 2019-11-19: 100 mL via INTRAVENOUS

## 2019-11-19 MED ORDER — DROPERIDOL 2.5 MG/ML IJ SOLN
1.2500 mg | Freq: Once | INTRAMUSCULAR | Status: AC
  Administered 2019-11-19: 1.25 mg via INTRAVENOUS
  Filled 2019-11-19: qty 2

## 2019-11-19 MED ORDER — LACTATED RINGERS IV BOLUS
500.0000 mL | Freq: Once | INTRAVENOUS | Status: AC
  Administered 2019-11-19: 500 mL via INTRAVENOUS
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
## 2019-11-19 NOTE — ED Triage Notes (Addendum)
Pt reports headache, vomiting, severe lower abdominal pain and cough since Tuesday. Denies fever but reports chills. Denies any recent known sick contacts, states hx of bowel obstruction. A/ox4, resp e/u, nad.

## 2019-11-20 LAB — SARS CORONAVIRUS 2 (TAT 6-24 HRS): SARS Coronavirus 2: NEGATIVE

## 2019-11-21 ENCOUNTER — Telehealth (HOSPITAL_COMMUNITY): Payer: Self-pay

## 2020-02-16 ENCOUNTER — Encounter: Payer: Self-pay | Admitting: Physician Assistant

## 2020-02-16 ENCOUNTER — Other Ambulatory Visit: Payer: Self-pay

## 2020-02-16 ENCOUNTER — Ambulatory Visit (INDEPENDENT_AMBULATORY_CARE_PROVIDER_SITE_OTHER): Payer: Medicare Other | Admitting: Physician Assistant

## 2020-02-16 ENCOUNTER — Telehealth: Payer: Self-pay | Admitting: Cardiology

## 2020-02-16 VITALS — BP 146/72 | HR 83 | Ht 61.0 in | Wt 223.8 lb

## 2020-02-16 DIAGNOSIS — Z720 Tobacco use: Secondary | ICD-10-CM

## 2020-02-16 DIAGNOSIS — E7800 Pure hypercholesterolemia, unspecified: Secondary | ICD-10-CM | POA: Diagnosis not present

## 2020-02-16 DIAGNOSIS — I2510 Atherosclerotic heart disease of native coronary artery without angina pectoris: Secondary | ICD-10-CM

## 2020-02-16 DIAGNOSIS — I10 Essential (primary) hypertension: Secondary | ICD-10-CM

## 2020-02-16 DIAGNOSIS — R519 Headache, unspecified: Secondary | ICD-10-CM

## 2020-02-16 MED ORDER — AMLODIPINE BESYLATE 5 MG PO TABS: 5.0000 mg | ORAL_TABLET | Freq: Two times a day (BID) | ORAL | 3 refills | Status: DC
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
## 2020-02-16 NOTE — Telephone Encounter (Signed)
New Message    Pt c/o BP issue: STAT if pt c/o blurred vision, one-sided weakness or slurred speech  1. What are your last 5 BP readings?This morning  166/101 157/109   2. Are you having any other symptoms (ex. Dizziness, headache, blurred vision, passed out)? Today, Severe headache Last night severe headache and difficulty breathing   3. What is your BP issue? Pt is experiencing elevated BP and says once in a while her left arm goes numb. She says she has a severe headache   Pt says she took her BP medication at 5am today

## 2020-02-16 NOTE — Patient Instructions (Signed)
Medication Instructions:  Your physician has recommended you make the following change in your medication:  1.  INCREASE the Amlodipine to twice a day   *If you need a refill on your cardiac medications before your next appointment, please call your pharmacy*   Lab Work: None ordered  If you have labs (blood work) drawn today and your tests are completely normal, you will receive your results only by: Marland Kitchen MyChart Message (if you have MyChart) OR . A paper copy in the mail If you have any lab test that is abnormal or we need to change your treatment, we will call you to review the results.   Testing/Procedures: None ordered    Follow-Up: At [facility], you and your health needs are our priority.  As part of our continuing mission to provide you with exceptional heart care, we have created designated Provider Care Teams.  These Care Teams include your primary Cardiologist (physician) and Advanced Practice Providers (APPs -  Physician Assistants and Nurse Practitioners) who all work together to provide you with the care you need, when you need it.  We recommend signing up for the patient portal called "MyChart".  Sign up information is provided on this After Visit Summary.  MyChart is used to connect with patients for Virtual Visits (Telemedicine).  Patients are able to view lab/test results, encounter notes, upcoming appointments, etc.  Non-urgent messages can be sent to your provider as well.   To learn more about what you can do with MyChart, go to [email or web address].    Your next appointment:   AS PLANNED   The format for your next appointment:   In Person  Provider:   You may see Fransico Him, MD or one of the following Advanced Practice Providers on your designated Care Team:    Melina Copa, PA-C  Ermalinda Barrios, PA-C    Other Instructions

## 2020-02-16 NOTE — Telephone Encounter (Signed)
Spoke with pt, she reports her medical doctor has been making a lot of changes in her medications and he is on vacation and she would like to be seen by the cardiologist. She woke this morning with a migraine headache and her bp was elevated. It continues to be elevated after taking her medications this morning. She also reports SOB and her oxygen saturation is running 94-96%. Scheduled to see the APP today. She will bring bp cuff and all current medications to the visit.

## 2020-02-21 ENCOUNTER — Emergency Department (HOSPITAL_COMMUNITY)
Admission: EM | Admit: 2020-02-21 | Discharge: 2020-02-21 | Disposition: A | Discharge status: Home / Self Care | Payer: Medicare Other | Attending: Emergency Medicine | Admitting: Emergency Medicine

## 2020-02-21 ENCOUNTER — Other Ambulatory Visit: Payer: Self-pay

## 2020-02-21 ENCOUNTER — Emergency Department (HOSPITAL_BASED_OUTPATIENT_CLINIC_OR_DEPARTMENT_OTHER): Payer: Medicare Other

## 2020-02-21 ENCOUNTER — Emergency Department (HOSPITAL_COMMUNITY): Payer: Medicare Other

## 2020-02-21 ENCOUNTER — Ambulatory Visit (HOSPITAL_COMMUNITY): Payer: Medicare Other

## 2020-02-21 ENCOUNTER — Encounter (HOSPITAL_COMMUNITY): Payer: Self-pay

## 2020-02-21 DIAGNOSIS — I2510 Atherosclerotic heart disease of native coronary artery without angina pectoris: Secondary | ICD-10-CM | POA: Diagnosis not present

## 2020-02-21 DIAGNOSIS — F17210 Nicotine dependence, cigarettes, uncomplicated: Secondary | ICD-10-CM | POA: Diagnosis not present

## 2020-02-21 DIAGNOSIS — Z79899 Other long term (current) drug therapy: Secondary | ICD-10-CM | POA: Insufficient documentation

## 2020-02-21 DIAGNOSIS — Z7984 Long term (current) use of oral hypoglycemic drugs: Secondary | ICD-10-CM | POA: Insufficient documentation

## 2020-02-21 DIAGNOSIS — R519 Headache, unspecified: Secondary | ICD-10-CM | POA: Diagnosis not present

## 2020-02-21 DIAGNOSIS — R2243 Localized swelling, mass and lump, lower limb, bilateral: Secondary | ICD-10-CM | POA: Diagnosis present

## 2020-02-21 DIAGNOSIS — M545 Low back pain: Secondary | ICD-10-CM | POA: Insufficient documentation

## 2020-02-21 DIAGNOSIS — J449 Chronic obstructive pulmonary disease, unspecified: Secondary | ICD-10-CM | POA: Insufficient documentation

## 2020-02-21 DIAGNOSIS — I10 Essential (primary) hypertension: Secondary | ICD-10-CM | POA: Insufficient documentation

## 2020-02-21 DIAGNOSIS — M7989 Other specified soft tissue disorders: Secondary | ICD-10-CM | POA: Diagnosis not present

## 2020-02-21 DIAGNOSIS — Z96652 Presence of left artificial knee joint: Secondary | ICD-10-CM | POA: Insufficient documentation

## 2020-02-21 DIAGNOSIS — G8929 Other chronic pain: Secondary | ICD-10-CM | POA: Insufficient documentation

## 2020-02-21 DIAGNOSIS — E119 Type 2 diabetes mellitus without complications: Secondary | ICD-10-CM | POA: Insufficient documentation

## 2020-02-21 LAB — CBC
HCT: 41.4 % (ref 36.0–46.0)
Hemoglobin: 13 g/dL (ref 12.0–15.0)
MCH: 28.9 pg (ref 26.0–34.0)
MCHC: 31.4 g/dL (ref 30.0–36.0)
MCV: 92 fL (ref 80.0–100.0)
Platelets: 183 10*3/uL (ref 150–400)
RBC: 4.5 MIL/uL (ref 3.87–5.11)
RDW: 12.9 % (ref 11.5–15.5)
WBC: 7.7 10*3/uL (ref 4.0–10.5)
nRBC: 0 % (ref 0.0–0.2)

## 2020-02-21 LAB — HEPATIC FUNCTION PANEL
ALT: 18 U/L (ref 0–44)
AST: 27 U/L (ref 15–41)
Albumin: 3.3 g/dL — ABNORMAL LOW (ref 3.5–5.0)
Alkaline Phosphatase: 89 U/L (ref 38–126)
Bilirubin, Direct: 0.2 mg/dL (ref 0.0–0.2)
Indirect Bilirubin: 0.2 mg/dL — ABNORMAL LOW (ref 0.3–0.9)
Total Bilirubin: 0.4 mg/dL (ref 0.3–1.2)
Total Protein: 7.5 g/dL (ref 6.5–8.1)

## 2020-02-21 LAB — BASIC METABOLIC PANEL
Anion gap: 9 (ref 5–15)
BUN: 7 mg/dL (ref 6–20)
CO2: 31 mmol/L (ref 22–32)
Calcium: 8.4 mg/dL — ABNORMAL LOW (ref 8.9–10.3)
Chloride: 91 mmol/L — ABNORMAL LOW (ref 98–111)
Creatinine, Ser: 0.79 mg/dL (ref 0.44–1.00)
GFR calc Af Amer: >60 mL/min (ref >60)
GFR calc non Af Amer: >60 mL/min (ref >60)
Glucose, Bld: 136 mg/dL — ABNORMAL HIGH (ref 70–99)
Potassium: 3.8 mmol/L (ref 3.5–5.1)
Sodium: 131 mmol/L — ABNORMAL LOW (ref 135–145)

## 2020-02-21 MED ORDER — OXYCODONE-ACETAMINOPHEN 5-325 MG PO TABS
1.0000 | ORAL_TABLET | Freq: Once | ORAL | Status: AC
  Administered 2020-02-21: 1 via ORAL
  Filled 2020-02-21: qty 1

## 2020-02-21 MED ORDER — FUROSEMIDE 10 MG/ML IJ SOLN
40.0000 mg | Freq: Once | INTRAMUSCULAR | Status: AC
  Administered 2020-02-21: 40 mg via INTRAVENOUS
  Filled 2020-02-21: qty 4

## 2020-02-21 NOTE — Progress Notes (Signed)
Venous duplex       has been completed. Preliminary results can be found under CV proc through chart review. Lindy Garczynski, BS, RDMS, RVT   

## 2020-02-21 NOTE — Discharge Instructions (Addendum)
You were seen today for feet swelling.  Your labs are reassuring, there is no evidence of clot in your legs.  There was a small cystic structure in your right popliteal fossa, you can follow-up with your primary care about this as we spoke about.  Use the guides attached.  You can use compression stockings and I want you to elevate your feet.  I want you to follow-up with your primary care about all the things we talked about today including your chronic headaches, chronic back pain, hypertension.  Awake back to the emergency department if you have any fevers, overlying redness or continue with swelling of your legs, weakness, chest pain, shortness of breath, dizziness, worsening symptoms.
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
## 2020-02-21 NOTE — ED Triage Notes (Signed)
Pt has multiple complaints of hypertension, headache, back pain and and swollen feet.

## 2020-02-27 ENCOUNTER — Ambulatory Visit (HOSPITAL_COMMUNITY)
Admission: EM | Admit: 2020-02-27 | Discharge: 2020-02-27 | Disposition: A | Discharge status: Home / Self Care | Payer: Medicare Other

## 2020-02-27 ENCOUNTER — Emergency Department (HOSPITAL_COMMUNITY)
Admission: EM | Admit: 2020-02-27 | Discharge: 2020-02-27 | Discharge status: Against Medical Advice | Payer: Medicare Other

## 2020-02-27 NOTE — ED Notes (Signed)
Pt left before vitals and assessment.

## 2020-02-27 NOTE — ED Triage Notes (Signed)
Patient is being discharged from the Urgent Care and sent to the Emergency Department via wheelchair . Per Dr. Meda Coffee, patient is in need of higher level of care due to recent ER visit, similar problem, electrolyte abnormalities on last ER visit. Patient is aware and verbalizes understanding of plan of care. There were no vitals filed for this visit.

## 2020-02-28 ENCOUNTER — Encounter (HOSPITAL_COMMUNITY): Payer: Self-pay | Admitting: Emergency Medicine

## 2020-02-28 ENCOUNTER — Observation Stay (HOSPITAL_COMMUNITY)
Admission: AD | Admit: 2020-02-28 | Discharge: 2020-02-29 | Disposition: A | Discharge status: Home / Self Care | Payer: Medicare Other | Attending: Family Medicine | Admitting: Family Medicine

## 2020-02-28 ENCOUNTER — Emergency Department (HOSPITAL_COMMUNITY): Payer: Medicare Other

## 2020-02-28 ENCOUNTER — Other Ambulatory Visit: Payer: Self-pay

## 2020-02-28 DIAGNOSIS — M1990 Unspecified osteoarthritis, unspecified site: Secondary | ICD-10-CM | POA: Insufficient documentation

## 2020-02-28 DIAGNOSIS — E785 Hyperlipidemia, unspecified: Secondary | ICD-10-CM | POA: Insufficient documentation

## 2020-02-28 DIAGNOSIS — Z20822 Contact with and (suspected) exposure to covid-19: Secondary | ICD-10-CM | POA: Insufficient documentation

## 2020-02-28 DIAGNOSIS — I152 Hypertension secondary to endocrine disorders: Secondary | ICD-10-CM | POA: Diagnosis present

## 2020-02-28 DIAGNOSIS — I10 Essential (primary) hypertension: Secondary | ICD-10-CM

## 2020-02-28 DIAGNOSIS — Z791 Long term (current) use of non-steroidal anti-inflammatories (NSAID): Secondary | ICD-10-CM | POA: Diagnosis not present

## 2020-02-28 DIAGNOSIS — Z7951 Long term (current) use of inhaled steroids: Secondary | ICD-10-CM | POA: Insufficient documentation

## 2020-02-28 DIAGNOSIS — M5442 Lumbago with sciatica, left side: Secondary | ICD-10-CM | POA: Diagnosis not present

## 2020-02-28 DIAGNOSIS — K7460 Unspecified cirrhosis of liver: Secondary | ICD-10-CM | POA: Diagnosis not present

## 2020-02-28 DIAGNOSIS — R188 Other ascites: Secondary | ICD-10-CM | POA: Diagnosis present

## 2020-02-28 DIAGNOSIS — F418 Other specified anxiety disorders: Secondary | ICD-10-CM | POA: Diagnosis present

## 2020-02-28 DIAGNOSIS — Z79899 Other long term (current) drug therapy: Secondary | ICD-10-CM

## 2020-02-28 DIAGNOSIS — Z6841 Body Mass Index (BMI) 40.0 and over, adult: Secondary | ICD-10-CM | POA: Insufficient documentation

## 2020-02-28 DIAGNOSIS — F17210 Nicotine dependence, cigarettes, uncomplicated: Secondary | ICD-10-CM | POA: Insufficient documentation

## 2020-02-28 DIAGNOSIS — K5901 Slow transit constipation: Secondary | ICD-10-CM | POA: Diagnosis present

## 2020-02-28 DIAGNOSIS — G43909 Migraine, unspecified, not intractable, without status migrainosus: Secondary | ICD-10-CM | POA: Diagnosis not present

## 2020-02-28 DIAGNOSIS — E871 Hypo-osmolality and hyponatremia: Principal | ICD-10-CM | POA: Diagnosis present

## 2020-02-28 DIAGNOSIS — J449 Chronic obstructive pulmonary disease, unspecified: Secondary | ICD-10-CM | POA: Diagnosis present

## 2020-02-28 DIAGNOSIS — E1169 Type 2 diabetes mellitus with other specified complication: Secondary | ICD-10-CM | POA: Diagnosis not present

## 2020-02-28 DIAGNOSIS — I2510 Atherosclerotic heart disease of native coronary artery without angina pectoris: Secondary | ICD-10-CM | POA: Diagnosis not present

## 2020-02-28 DIAGNOSIS — R600 Localized edema: Secondary | ICD-10-CM

## 2020-02-28 DIAGNOSIS — G4733 Obstructive sleep apnea (adult) (pediatric): Secondary | ICD-10-CM | POA: Diagnosis present

## 2020-02-28 DIAGNOSIS — Z720 Tobacco use: Secondary | ICD-10-CM | POA: Diagnosis present

## 2020-02-28 DIAGNOSIS — G8929 Other chronic pain: Secondary | ICD-10-CM | POA: Diagnosis present

## 2020-02-28 DIAGNOSIS — Z7984 Long term (current) use of oral hypoglycemic drugs: Secondary | ICD-10-CM | POA: Diagnosis not present

## 2020-02-28 DIAGNOSIS — F329 Major depressive disorder, single episode, unspecified: Secondary | ICD-10-CM

## 2020-02-28 DIAGNOSIS — K219 Gastro-esophageal reflux disease without esophagitis: Secondary | ICD-10-CM | POA: Diagnosis not present

## 2020-02-28 DIAGNOSIS — F1190 Opioid use, unspecified, uncomplicated: Secondary | ICD-10-CM | POA: Diagnosis present

## 2020-02-28 DIAGNOSIS — R232 Flushing: Secondary | ICD-10-CM

## 2020-02-28 DIAGNOSIS — R609 Edema, unspecified: Secondary | ICD-10-CM

## 2020-02-28 DIAGNOSIS — E119 Type 2 diabetes mellitus without complications: Secondary | ICD-10-CM | POA: Diagnosis present

## 2020-02-28 DIAGNOSIS — N951 Menopausal and female climacteric states: Secondary | ICD-10-CM

## 2020-02-28 DIAGNOSIS — J309 Allergic rhinitis, unspecified: Secondary | ICD-10-CM

## 2020-02-28 DIAGNOSIS — E7800 Pure hypercholesterolemia, unspecified: Secondary | ICD-10-CM | POA: Insufficient documentation

## 2020-02-28 LAB — CBC
HCT: 38.6 % (ref 36.0–46.0)
Hemoglobin: 12.6 g/dL (ref 12.0–15.0)
MCH: 28.8 pg (ref 26.0–34.0)
MCHC: 32.6 g/dL (ref 30.0–36.0)
MCV: 88.1 fL (ref 80.0–100.0)
Platelets: 174 10*3/uL (ref 150–400)
RBC: 4.38 MIL/uL (ref 3.87–5.11)
RDW: 12.8 % (ref 11.5–15.5)
WBC: 7.5 10*3/uL (ref 4.0–10.5)
nRBC: 0 % (ref 0.0–0.2)

## 2020-02-28 LAB — BRAIN NATRIURETIC PEPTIDE: B Natriuretic Peptide: 39.6 pg/mL (ref 0.0–100.0)

## 2020-02-28 LAB — SARS CORONAVIRUS 2 BY RT PCR (HOSPITAL ORDER, PERFORMED IN ~~LOC~~ HOSPITAL LAB): SARS Coronavirus 2: NEGATIVE

## 2020-02-28 LAB — URINALYSIS, ROUTINE W REFLEX MICROSCOPIC
Bilirubin Urine: NEGATIVE
Glucose, UA: NEGATIVE mg/dL
Hgb urine dipstick: NEGATIVE
Ketones, ur: NEGATIVE mg/dL
Leukocytes,Ua: NEGATIVE
Nitrite: NEGATIVE
Protein, ur: NEGATIVE mg/dL
Specific Gravity, Urine: 1.005 (ref 1.005–1.030)
pH: 6 (ref 5.0–8.0)

## 2020-02-28 LAB — BASIC METABOLIC PANEL
Anion gap: 12 (ref 5–15)
BUN: 10 mg/dL (ref 6–20)
CO2: 30 mmol/L (ref 22–32)
Calcium: 8.1 mg/dL — ABNORMAL LOW (ref 8.9–10.3)
Chloride: 83 mmol/L — ABNORMAL LOW (ref 98–111)
Creatinine, Ser: 0.9 mg/dL (ref 0.44–1.00)
GFR calc Af Amer: >60 mL/min (ref >60)
GFR calc non Af Amer: >60 mL/min (ref >60)
Glucose, Bld: 137 mg/dL — ABNORMAL HIGH (ref 70–99)
Potassium: 4.2 mmol/L (ref 3.5–5.1)
Sodium: 125 mmol/L — ABNORMAL LOW (ref 135–145)

## 2020-02-28 LAB — OSMOLALITY: Osmolality: 270 mOsm/kg — ABNORMAL LOW (ref 275–295)

## 2020-02-28 MED ORDER — MOMETASONE FURO-FORMOTEROL FUM 200-5 MCG/ACT IN AERO
2.0000 | INHALATION_SPRAY | Freq: Two times a day (BID) | RESPIRATORY_TRACT | Status: DC
  Administered 2020-02-29: 2 via RESPIRATORY_TRACT
  Filled 2020-02-28: qty 8.8

## 2020-02-28 MED ORDER — POLYETHYLENE GLYCOL 3350 17 G PO PACK: 17.0000 g | PACK | Freq: Every day | ORAL | Status: DC | PRN

## 2020-02-28 MED ORDER — ENOXAPARIN SODIUM 40 MG/0.4ML ~~LOC~~ SOLN
40.0000 mg | SUBCUTANEOUS | Status: DC
  Administered 2020-02-29: 40 mg via SUBCUTANEOUS
  Filled 2020-02-28: qty 0.4

## 2020-02-28 MED ORDER — ESTRADIOL 1 MG PO TABS
2.0000 mg | ORAL_TABLET | Freq: Every day | ORAL | Status: DC
  Administered 2020-02-29: 2 mg via ORAL
  Filled 2020-02-28: qty 2
  Filled 2020-02-28: qty 1

## 2020-02-28 MED ORDER — SIMETHICONE 80 MG PO CHEW
240.0000 mg | CHEWABLE_TABLET | Freq: Two times a day (BID) | ORAL | Status: DC | PRN
  Filled 2020-02-28: qty 3

## 2020-02-28 MED ORDER — PANTOPRAZOLE SODIUM 40 MG PO TBEC
40.0000 mg | DELAYED_RELEASE_TABLET | Freq: Two times a day (BID) | ORAL | Status: DC
  Administered 2020-02-29 (×2): 40 mg via ORAL
  Filled 2020-02-28 (×2): qty 1

## 2020-02-28 MED ORDER — SIMVASTATIN 20 MG PO TABS
20.0000 mg | ORAL_TABLET | Freq: Every day | ORAL | Status: DC
  Administered 2020-02-29: 20 mg via ORAL
  Filled 2020-02-28: qty 1

## 2020-02-28 MED ORDER — SODIUM CHLORIDE 0.9 % IV SOLN: INTRAVENOUS | Status: DC

## 2020-02-28 MED ORDER — ESCITALOPRAM OXALATE 20 MG PO TABS: 20.0000 mg | ORAL_TABLET | Freq: Every evening | ORAL | Status: DC

## 2020-02-28 MED ORDER — LORATADINE 10 MG PO TABS: 10.0000 mg | ORAL_TABLET | Freq: Every evening | ORAL | Status: DC

## 2020-02-28 MED ORDER — ALBUMIN HUMAN 5 % IV SOLN
12.5000 g | Freq: Once | INTRAVENOUS | Status: AC
  Administered 2020-02-29: 12.5 g via INTRAVENOUS
  Filled 2020-02-28: qty 250

## 2020-02-28 MED ORDER — ALPRAZOLAM 0.5 MG PO TABS: 1.0000 mg | ORAL_TABLET | Freq: Every day | ORAL | Status: DC | PRN

## 2020-02-28 MED ORDER — CEPHALEXIN 500 MG PO CAPS
500.0000 mg | ORAL_CAPSULE | Freq: Two times a day (BID) | ORAL | Status: DC
  Administered 2020-02-29 (×2): 500 mg via ORAL
  Filled 2020-02-28 (×2): qty 1

## 2020-02-28 MED ORDER — PROCHLORPERAZINE MALEATE 10 MG PO TABS
10.0000 mg | ORAL_TABLET | Freq: Four times a day (QID) | ORAL | Status: DC | PRN
  Filled 2020-02-28: qty 1

## 2020-02-28 MED ORDER — ALBUTEROL SULFATE (2.5 MG/3ML) 0.083% IN NEBU: 2.5000 mg | INHALATION_SOLUTION | RESPIRATORY_TRACT | Status: DC | PRN

## 2020-02-28 MED ORDER — SODIUM CHLORIDE 0.9% FLUSH: 3.0000 mL | Freq: Once | INTRAVENOUS | Status: DC

## 2020-02-28 MED ORDER — NYSTATIN 100000 UNIT/GM EX POWD
1.0000 g | Freq: Every day | CUTANEOUS | Status: DC | PRN
  Administered 2020-02-29: 1 via TOPICAL
  Filled 2020-02-28: qty 15

## 2020-02-28 MED ORDER — FLUTICASONE PROPIONATE 50 MCG/ACT NA SUSP
1.0000 | Freq: Every day | NASAL | Status: DC
  Filled 2020-02-28: qty 16

## 2020-02-28 MED ORDER — MELOXICAM 7.5 MG PO TABS
15.0000 mg | ORAL_TABLET | Freq: Every day | ORAL | Status: DC
  Administered 2020-02-29: 15 mg via ORAL
  Filled 2020-02-28: qty 2

## 2020-02-28 MED ORDER — INSULIN ASPART 100 UNIT/ML ~~LOC~~ SOLN: 0.0000 [IU] | Freq: Every day | SUBCUTANEOUS | Status: DC

## 2020-02-28 MED ORDER — ONDANSETRON HCL 4 MG PO TABS: 8.0000 mg | ORAL_TABLET | Freq: Three times a day (TID) | ORAL | Status: DC | PRN

## 2020-02-28 MED ORDER — OXYCODONE HCL 5 MG PO TABS
15.0000 mg | ORAL_TABLET | ORAL | Status: DC | PRN
  Administered 2020-02-28 – 2020-02-29 (×3): 15 mg via ORAL
  Filled 2020-02-28 (×4): qty 3

## 2020-02-28 MED ORDER — FUROSEMIDE 10 MG/ML IJ SOLN
40.0000 mg | Freq: Once | INTRAMUSCULAR | Status: AC
  Administered 2020-02-28: 40 mg via INTRAVENOUS
  Filled 2020-02-28: qty 4

## 2020-02-28 MED ORDER — CYCLOBENZAPRINE HCL 10 MG PO TABS
10.0000 mg | ORAL_TABLET | Freq: Three times a day (TID) | ORAL | Status: DC
  Administered 2020-02-29: 10 mg via ORAL
  Filled 2020-02-28: qty 1

## 2020-02-28 MED ORDER — DOCUSATE SODIUM 100 MG PO CAPS
200.0000 mg | ORAL_CAPSULE | Freq: Two times a day (BID) | ORAL | Status: DC
  Administered 2020-02-29 (×2): 200 mg via ORAL
  Filled 2020-02-28 (×2): qty 2

## 2020-02-28 MED ORDER — PSYLLIUM 95 % PO PACK
1.0000 | PACK | Freq: Every day | ORAL | Status: DC
  Filled 2020-02-28: qty 1

## 2020-02-28 MED ORDER — ALPRAZOLAM 0.5 MG PO TABS
2.0000 mg | ORAL_TABLET | Freq: Every day | ORAL | Status: DC
  Administered 2020-02-29: 2 mg via ORAL
  Filled 2020-02-28: qty 4

## 2020-02-28 MED ORDER — INSULIN ASPART 100 UNIT/ML ~~LOC~~ SOLN
0.0000 [IU] | Freq: Three times a day (TID) | SUBCUTANEOUS | Status: DC
  Administered 2020-02-29: 4 [IU] via SUBCUTANEOUS

## 2020-02-28 MED ORDER — METFORMIN HCL 500 MG PO TABS
500.0000 mg | ORAL_TABLET | Freq: Two times a day (BID) | ORAL | Status: DC
  Administered 2020-02-29: 500 mg via ORAL
  Filled 2020-02-28: qty 1
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
## 2020-02-28 NOTE — ED Triage Notes (Signed)
Patient arrives to ED with complaints of bilateral leg swelling one week ago. Patient had blood work drawn yesterday at [facility] which showed low Na which her MD sent her here for. Patient reports of chronic lower back pain that continues to bother her.

## 2020-02-28 NOTE — ED Notes (Signed)
Pt stated they are waiting ouside

## 2020-02-28 NOTE — ED Notes (Signed)
Per pt she is chronically on 2L [place] at bedtime. Pt placed on 2L at this time. SpO2 at 89 without. SpO2 now at 95%

## 2020-02-29 DIAGNOSIS — E871 Hypo-osmolality and hyponatremia: Secondary | ICD-10-CM | POA: Diagnosis not present

## 2020-02-29 LAB — HIV ANTIBODY (ROUTINE TESTING W REFLEX): HIV Screen 4th Generation wRfx: NONREACTIVE

## 2020-02-29 LAB — OSMOLALITY, URINE: Osmolality, Ur: 157 mOsm/kg — ABNORMAL LOW (ref 300–900)

## 2020-02-29 LAB — COMPREHENSIVE METABOLIC PANEL
ALT: 20 U/L (ref 0–44)
AST: 37 U/L (ref 15–41)
Albumin: 3.2 g/dL — ABNORMAL LOW (ref 3.5–5.0)
Alkaline Phosphatase: 79 U/L (ref 38–126)
Anion gap: 7 (ref 5–15)
BUN: 7 mg/dL (ref 6–20)
CO2: 34 mmol/L — ABNORMAL HIGH (ref 22–32)
Calcium: 8.3 mg/dL — ABNORMAL LOW (ref 8.9–10.3)
Chloride: 90 mmol/L — ABNORMAL LOW (ref 98–111)
Creatinine, Ser: 0.85 mg/dL (ref 0.44–1.00)
GFR calc Af Amer: >60 mL/min (ref >60)
GFR calc non Af Amer: >60 mL/min (ref >60)
Glucose, Bld: 123 mg/dL — ABNORMAL HIGH (ref 70–99)
Potassium: 3.7 mmol/L (ref 3.5–5.1)
Sodium: 131 mmol/L — ABNORMAL LOW (ref 135–145)
Total Bilirubin: 0.8 mg/dL (ref 0.3–1.2)
Total Protein: 7.3 g/dL (ref 6.5–8.1)

## 2020-02-29 LAB — GLUCOSE, CAPILLARY
Glucose-Capillary: 115 mg/dL — ABNORMAL HIGH (ref 70–99)
Glucose-Capillary: 119 mg/dL — ABNORMAL HIGH (ref 70–99)
Glucose-Capillary: 194 mg/dL — ABNORMAL HIGH (ref 70–99)

## 2020-02-29 LAB — PROTIME-INR
INR: 1.2 (ref 0.8–1.2)
Prothrombin Time: 15 seconds (ref 11.4–15.2)

## 2020-02-29 LAB — TSH: TSH: 1.721 u[IU]/mL (ref 0.350–4.500)

## 2020-02-29 NOTE — Care Management CC44 (Signed)
Condition Code 44 Documentation Completed  Patient Details  Name: AYJAH SHOW MRN: [id number] Date of Birth: 1961/04/06   Condition Code 44 given:  Yes Patient signature on Condition Code 44 notice:  Yes Documentation of 2 MD's agreement:  Yes Code 44 added to claim:  Yes    Marilu Favre, RN 02/29/2020, 11:29 AM

## 2020-02-29 NOTE — TOC Initial Note (Signed)
Transition of Care [facility]) - Initial/Assessment Note    Patient Details  Name: [name] MRN: [id number] Date of Birth: October 09, 1960  [facility] CM/SW Contact:    Marilu Favre, RN Phone Number: 02/29/2020, 11:32 AM  Clinical Narrative:                   Expected Discharge Plan: Home/Self Care Barriers to Discharge: No Barriers Identified   Patient Goals and CMS Choice Patient states their goals for this hospitalization and ongoing recovery are:: to return to home CMS Medicare.gov Compare Post Acute Care list provided to:: Patient Choice offered to / list presented to : NA  Expected Discharge Plan and Services Expected Discharge Plan: Home/Self Care       Living arrangements for the past 2 months: Single Family Home Expected Discharge Date: 02/29/20               DME Arranged: N/A         HH Arranged: NA          Prior Living Arrangements/Services Living arrangements for the past 2 months: Single Family Home Lives with:: Spouse Patient language and need for interpreter reviewed:: Yes Do you feel safe going back to the place where you live?: Yes      Need for Family Participation in Patient Care: Yes (Comment) Care giver support system in place?: Yes (comment) Current home services: DME (cane) Criminal Activity/Legal Involvement Pertinent to Current Situation/Hospitalization: No - Comment as needed  Activities of Daily Living Home Assistive Devices/Equipment: Cane (specify quad or straight), CBG Meter, Eyeglasses ADL Screening (condition at time of admission) Patient's cognitive ability adequate to safely complete daily activities?: Yes Is the patient deaf or have difficulty hearing?: No Does the patient have difficulty seeing, even when wearing glasses/contacts?: No Does the patient have difficulty concentrating, remembering, or making decisions?: No Patient able to express need for assistance with ADLs?: Yes Does the patient have  difficulty dressing or bathing?: No Independently performs ADLs?: Yes (appropriate for developmental age) Does the patient have difficulty walking or climbing stairs?: No Weakness of Legs: None Weakness of Arms/Hands: None  Permission Sought/Granted   Permission granted to share information with : No              Emotional Assessment Appearance:: Appears stated age Attitude/Demeanor/Rapport: Engaged Affect (typically observed): Accepting Orientation: : Oriented to Self, Oriented to Place, Oriented to  Time, Oriented to Situation Alcohol / Substance Use: Not Applicable Psych Involvement: No (comment)  Admission diagnosis:  Hyponatremia [E87.1] Patient Active Problem List   Diagnosis Date Noted   Hyponatremia 02/28/2020   Peripheral edema 02/28/2020   Pain in left knee 09/12/2019   Cirrhosis of liver with ascites ([place]) 05/01/2019   Ascites 05/01/2019   NAFLD (nonalcoholic fatty liver disease) 05/01/2019   Chronic prescription benzodiazepine use 05/01/2019   Chronic, continuous use of opioids 05/01/2019   Abdominal pain 01/12/2019   HLD (hyperlipidemia) 01/11/2019   Type 2 diabetes mellitus with obesity ([place]) 01/11/2019   Depression with anxiety 01/11/2019   OSA (obstructive sleep apnea) 01/11/2019   Hypomagnesemia    Hypophosphatemia    Degeneration of lumbar intervertebral disc 08/26/2018   Lumbar spondylosis 07/29/2018   Lumbar radiculopathy 10/03/2017   Chronic low back pain with left-sided sciatica 10/15/2015   Migraine headache 12/09/2014   Constipation by delayed colonic transit 12/09/2014   History of prosthetic unicompartmental arthroplasty of left knee 03/24/2013   Morbid obesity ([place]) 03/09/2013  Chronic pain    Tobacco abuse    CAD (coronary artery disease)    Hypertension associated with diabetes ([place])    COPD (chronic obstructive pulmonary disease) ([place])    PCP:  Shirline Frees, MD Pharmacy:   [facility] [place], [place] AT [address] [address] Phone: [phone] Fax: [phone]     Social Determinants of Health (SDOH) Interventions    Readmission Risk Interventions No flowsheet data found.

## 2020-02-29 NOTE — Discharge Instructions (Signed)
Hyponatremia Hyponatremia is when the amount of salt (sodium) in your blood is too low. When salt levels are low, your body may take in extra water. This can cause swelling throughout the body. The swelling often affects the brain. What are the causes? This condition may be caused by:  Certain medical problems or conditions.  Vomiting a lot.  Having watery poop (diarrhea) often.  Certain medicines or illegal drugs.  Not having enough water in the body (dehydration).  Drinking too much water.  Eating a diet that is low in salt.  Large burns on your body.  Too much sweating. What increases the risk? You are more likely to get this condition if you:  Have long-term (chronic) kidney disease.  Have heart failure.  Have a medical condition that causes you to have watery poop often.  Do very hard exercises.  Take medicines that affect the amount of salt is in your blood. What are the signs or symptoms? Symptoms of this condition include:  Headache.  Feeling like you may vomit (nausea).  Vomiting.  Being very tired (lethargic).  Muscle weakness and cramps.  Not wanting to eat as much as normal (loss of appetite).  Feeling weak or light-headed. Severe symptoms of this condition include:  Confusion.  Feeling restless (agitation).  Having a fast heart rate.  Passing out (fainting).  Seizures.  Coma. How is this treated? Treatment for this condition depends on the cause. Treatment may include:  Getting fluids through an IV tube that is put into one of your veins.  Taking medicines to fix the salt levels in your blood. If medicines are causing the problem, your medicines will need to be changed.  Limiting how much water or fluid you take in.  Monitoring in the hospital to watch your symptoms. Follow these instructions at home:   Take over-the-counter and prescription medicines only as told by your doctor. Many medicines can make this condition worse.  Talk with your doctor about any medicines that you are taking.  Eat and drink exactly as you are told by your doctor. ? Eat only the foods you are told to eat. ? Limit how much fluid you take.  Do not drink alcohol.  Keep all follow-up visits as told by your doctor. This is important. Contact a doctor if:  You feel more like you may vomit.  You feel more tired.  Your headache gets worse.  You feel more confused.  You feel weaker.  Your symptoms go away and then they come back.  You have trouble following the diet instructions. Get help right away if:  You have a seizure.  You pass out.  You keep having watery poop.  You keep vomiting. Summary  Hyponatremia is when the amount of salt in your blood is too low.  When salt levels are low, you can have swelling throughout the body. The swelling mostly affects the brain.  Treatment depends on the cause. Treatment may include getting IV fluids, medicines, or not drinking as much fluid. This information is not intended to replace advice given to you by your health care provider. Make sure you discuss any questions you have with your health care provider. Document Revised: 11/18/2018 Document Reviewed: 08/05/2018 Elsevier Patient Education  [place].

## 2020-02-29 NOTE — Progress Notes (Signed)
Pt given discharge summary and discharged via husband as transportation.

## 2020-02-29 NOTE — Discharge Summary (Signed)
Physician Discharge Summary  [name] YBO:[id number] DOB: [birth year] DOA: 02/28/2020  PCP: Shirline Frees, MD  Admit date: 02/28/2020 Discharge date: 02/29/2020  Admitted From: Home Disposition: Home  Recommendations for Outpatient Follow-up:  1. Follow up with PCP in 1-2 weeks 2. Please obtain BMP/CBC in one week 3. Please follow up with your PCP on the following pending results: Unresulted Labs (From admission, onward) Comment          Start     Ordered   03/06/20 0500  Creatinine, serum  (enoxaparin (LOVENOX)    CrCl >/= 30 ml/min)  Weekly,   R     Comments: while on enoxaparin therapy    02/28/20 2349   02/29/20 0500  Comprehensive metabolic panel  Daily,   R      02/28/20 2349   02/28/20 2350  HIV Antibody (routine testing w rflx)  (HIV Antibody (Routine testing w reflex) panel)  Once,   R        02/28/20 2349   02/28/20 2350  Hemoglobin A1c  Add-on,   AD        02/28/20 2349           Home Health: None Equipment/Devices: None  Discharge Condition: Stable CODE STATUS: Full code Diet recommendation: Cardiac  Subjective: Seen and examined.  No complaints but feels a little sleepy.  Has history of sleep apnea but does not use CPAP machine as it broke several months ago and she did not replace it.  Continues to fall asleep when talking to me.  HPI: [name] is a 59 y.o. female with medical history significant of type 2 diabetes, hypertension, depression, obstructive sleep apnea, seizure disorder, COPD, chronic opioid use on 15 mg of OxyIR every 4 hours (pain clinic), with cirrhosis of the liver and ascites thought to be related to [place].  She has had paracentesis on several occasions in the past.  She developed lower extremity edema approximately 1 week ago which was new for her.  She came to the ER where she had negative Doppler studies.  She also developed redness in her legs and was treated for cellulitis and is on Keflex.  She went to her regular physician and had  blood work yesterday that revealed a sodium of 125 and she is brought in for admission for this. ED Course: Received Lasix x1 in the ED  Brief/Interim Summary: Patient was admitted to hospital service for acute on chronic mild hyponatremia.  Her baseline sodium seems to be 131 and she presented with 125.  She was started on normal saline and her sodium improved and is back to 131 today so her hyponatremia was likely hypovolemic hyponatremia.  She has no symptoms other than feeling tired and sleepy as she does not use CPAP machine for sleep apnea and tends to feel sleepy during the daytime.  She is otherwise stable and she is being discharged.  Discharge Diagnoses:  Principal Problem:   Hyponatremia Active Problems:   Chronic pain   Tobacco abuse   CAD (coronary artery disease)   Hypertension associated with diabetes ([place])   COPD (chronic obstructive pulmonary disease) ([name])   Morbid obesity ([name])   Constipation by delayed colonic transit   Chronic low back pain with left-sided sciatica   HLD (hyperlipidemia)   Type 2 diabetes mellitus with obesity ([name])   Depression with anxiety   OSA (obstructive sleep apnea)   Cirrhosis of liver with ascites ([name])   Chronic prescription benzodiazepine use  Chronic, continuous use of opioids   Peripheral edema    Discharge Instructions   Allergies as of 02/29/2020      Reactions   Spironolactone Nausea Only   nausea   Gabapentin Other (See Comments)   sleepwalking   Sulfamethoxazole-trimethoprim Hives   Amoxicillin Nausea And Vomiting   tolerated augmentin in the past (03/2013) without issue. Did it involve swelling of the face/tongue/throat, SOB, or low BP? No Did it involve sudden or severe rash/hives, skin peeling, or any reaction on the inside of your mouth or nose? No Did you need to seek medical attention at a hospital or doctor's office? No When did it last happen? unk If all above answers are "NO", may proceed with  cephalosporin use.   Clindamycin/lincomycin Nausea And Vomiting   Doxycycline Nausea And Vomiting   Treximet [sumatriptan-naproxen Sodium] Nausea And Vomiting   Bactrim Hives, Rash      Medication List    TAKE these medications   albuterol (2.5 MG/3ML) 0.083% nebulizer solution Commonly known as: PROVENTIL Take 2.5 mg by nebulization every 6 (six) hours as needed for wheezing or shortness of breath.   albuterol 108 (90 Base) MCG/ACT inhaler Commonly known as: VENTOLIN HFA Inhale 2 puffs into the lungs every 4 (four) hours as needed for wheezing or shortness of breath.   ALPRAZolam 1 MG tablet Commonly known as: XANAX Take 1-2 mg by mouth See admin instructions. Take 1 tablet (1 mg) by mouth in the morning as needed for anxiety & take 2 tablets (2 mg) by mouth scheduled at bedtime.   amLODipine 5 MG tablet Commonly known as: NORVASC Take 1 tablet (5 mg total) by mouth in the morning and at bedtime.   benazepril 40 MG tablet Commonly known as: LOTENSIN Take 40 mg by mouth daily.   cephALEXin 500 MG capsule Commonly known as: KEFLEX Take 500 mg by mouth 2 (two) times daily.   cyclobenzaprine 10 MG tablet Commonly known as: FLEXERIL Take 10 mg by mouth 3 (three) times daily.   docusate sodium 250 MG capsule Commonly known as: COLACE Take 250 mg by mouth 2 (two) times daily.   escitalopram 20 MG tablet Commonly known as: LEXAPRO Take 20 mg by mouth every evening.   estradiol 2 MG tablet Commonly known as: ESTRACE Take 2 mg by mouth daily.   fluticasone 50 MCG/ACT nasal spray Commonly known as: FLONASE Place 1 spray into both nostrils at bedtime.   levocetirizine 5 MG tablet Commonly known as: XYZAL Take 5 mg by mouth every evening.   meloxicam 15 MG tablet Commonly known as: MOBIC Take 15 mg by mouth daily as needed for pain.   metFORMIN 500 MG tablet Commonly known as: GLUCOPHAGE Take 2 tablets (1,000 mg total) by mouth 2 (two) times daily with a  meal. What changed:   how much to take  when to take this   Narcan 4 MG/0.1ML Liqd nasal spray kit Generic drug: naloxone Place 1 spray into the nose as needed (for overdose).   nystatin powder Commonly known as: MYCOSTATIN/NYSTOP Apply 1 g topically daily as needed (irritation. (Typically during Summer months)).   ondansetron 8 MG tablet Commonly known as: ZOFRAN Take 8 mg by mouth every 8 (eight) hours as needed for nausea or vomiting.   oxyCODONE 15 MG immediate release tablet Commonly known as: ROXICODONE Take 15 mg by mouth every 4 (four) hours.   pantoprazole 40 MG tablet Commonly known as: PROTONIX Take 40 mg by mouth 2 (two)  times daily.   Phazyme Maximum Strength 250 MG Caps Generic drug: Simethicone Take 250 mg by mouth 2 (two) times daily as needed (gas relief).   polyethylene glycol 17 g packet Commonly known as: MIRALAX / GLYCOLAX Take 17 g by mouth 2 (two) times daily. What changed:   when to take this  reasons to take this   prochlorperazine 10 MG tablet Commonly known as: COMPAZINE Take 10 mg by mouth every 6 (six) hours as needed for nausea or vomiting.   psyllium 58.6 % packet Commonly known as: METAMUCIL Take 1 packet by mouth daily as needed (regularity).   simvastatin 20 MG tablet Commonly known as: ZOCOR Take 20 mg by mouth at bedtime.   Symbicort 160-4.5 MCG/ACT inhaler Generic drug: budesonide-formoterol Inhale 2 puffs into the lungs in the morning and at bedtime.   torsemide 20 MG tablet Commonly known as: DEMADEX Take 40 mg by mouth daily as needed (fluid).       Follow-up Information    Shirline Frees, MD Follow up in 1 week(s).   Specialty: Family Medicine Contact information: [place] [address] [phone]        Sueanne Margarita, MD .   Specialty: Cardiology Contact information: [phone] [place] [address] [phone]              Allergies   Allergen Reactions  . Spironolactone Nausea Only    nausea  . Gabapentin Other (See Comments)    sleepwalking  . Sulfamethoxazole-Trimethoprim Hives  . Amoxicillin Nausea And Vomiting    tolerated augmentin in the past (03/2013) without issue. Did it involve swelling of the face/tongue/throat, SOB, or low BP? No Did it involve sudden or severe rash/hives, skin peeling, or any reaction on the inside of your mouth or nose? No Did you need to seek medical attention at a hospital or doctor's office? No When did it last happen? unk If all above answers are "NO", may proceed with cephalosporin use.   . Clindamycin/Lincomycin Nausea And Vomiting  . Doxycycline Nausea And Vomiting  . Treximet [Sumatriptan-Naproxen Sodium] Nausea And Vomiting  . Bactrim Hives and Rash    Consultations: None   Procedures/Studies: DG Chest Port 1 View  Result Date: 02/28/2020 CLINICAL DATA:  Shortness of breath EXAM: PORTABLE CHEST 1 VIEW COMPARISON:  02/21/2020 FINDINGS: Heart and mediastinal contours are within normal limits. No focal opacities or effusions. No acute bony abnormality. IMPRESSION: No active disease. Electronically Signed   By: Rolm Baptise M.D.   On: 02/28/2020 22:00   DG Chest Port 1 View  Result Date: 02/21/2020 CLINICAL DATA:  Chest pain, shortness of breath EXAM: PORTABLE CHEST 1 VIEW COMPARISON:  11/19/2019 FINDINGS: There is mild bilateral interstitial thickening likely chronic. There is no focal consolidation. There is no pleural effusion or pneumothorax. The heart and mediastinal contours are unremarkable. There is no acute osseous abnormality. IMPRESSION: No acute cardiopulmonary disease. Electronically Signed   By: Kathreen Devoid   On: 02/21/2020 13:13   VAS [place] LOWER EXTREMITY VENOUS (DVT)  Result Date: 02/21/2020  Lower Venous DVTStudy Indications: Foot swelling.  Performing Technologist: June Leap RDMS, RVT  Examination Guidelines: A complete evaluation includes B-mode  imaging, spectral Doppler, color Doppler, and power Doppler as needed of all accessible portions of each vessel. Bilateral testing is considered an integral part of a complete examination. Limited examinations for reoccurring indications may be performed as noted. The reflux portion of the exam  is performed with the patient in reverse Trendelenburg.  +---------+---------------+---------+-----------+----------+--------------+ RIGHT    CompressibilityPhasicitySpontaneityPropertiesThrombus Aging +---------+---------------+---------+-----------+----------+--------------+ CFV      Full           Yes      Yes                                 +---------+---------------+---------+-----------+----------+--------------+ SFJ      Full                                                        +---------+---------------+---------+-----------+----------+--------------+ FV Prox  Full                                                        +---------+---------------+---------+-----------+----------+--------------+ FV Mid   Full                                                        +---------+---------------+---------+-----------+----------+--------------+ FV DistalFull                                                        +---------+---------------+---------+-----------+----------+--------------+ PFV      Full                                                        +---------+---------------+---------+-----------+----------+--------------+ POP      Full           Yes      Yes                                 +---------+---------------+---------+-----------+----------+--------------+ PTV      Full                                                        +---------+---------------+---------+-----------+----------+--------------+ PERO     Full                                                        +---------+---------------+---------+-----------+----------+--------------+    +---------+---------------+---------+-----------+----------+--------------+ LEFT     CompressibilityPhasicitySpontaneityPropertiesThrombus Aging +---------+---------------+---------+-----------+----------+--------------+ CFV      Full           Yes      Yes                                 +---------+---------------+---------+-----------+----------+--------------+  SFJ      Full                                                        +---------+---------------+---------+-----------+----------+--------------+ FV Prox  Full                                                        +---------+---------------+---------+-----------+----------+--------------+ FV Mid   Full                                                        +---------+---------------+---------+-----------+----------+--------------+ FV DistalFull                                                        +---------+---------------+---------+-----------+----------+--------------+ PFV      Full                                                        +---------+---------------+---------+-----------+----------+--------------+ POP      Full           Yes      Yes                                 +---------+---------------+---------+-----------+----------+--------------+ PTV      Full                                                        +---------+---------------+---------+-----------+----------+--------------+ PERO     Full                                                        +---------+---------------+---------+-----------+----------+--------------+     Summary: BILATERAL: - No evidence of deep vein thrombosis seen in the lower extremities, bilaterally. - RIGHT: - A cystic structure is found in the popliteal fossa.  LEFT: - No cystic structure found in the popliteal fossa.  *See table(s) above for measurements and observations. Electronically signed by Deitra Mayo MD on 02/21/2020 at  4:06:09 PM.    Final       Discharge Exam: Vitals:   02/29/20 0556 02/29/20 0749  BP: 121/65   Pulse: 84 84  Resp: 20 18  Temp: 97.8 F (36.6 C)   SpO2: 99% 98%   Vitals:   02/28/20 2317 02/28/20 2355  02/29/20 0556 02/29/20 0749  BP:  126/69 121/65   Pulse:  84 84 84  Resp:  18 20 18   Temp: 97.7 F (36.5 C) 98.1 F (36.7 C) 97.8 F (36.6 C)   TempSrc:  Oral Oral   SpO2: 90% 100% 99% 98%  Weight:  104.5 kg    Height:  5' 1"  (1.549 m)      General: Pt is alert, awake, not in acute distress Cardiovascular: RRR, S1/S2 +, no rubs, no gallops Respiratory: CTA bilaterally, no wheezing, no rhonchi Abdominal: Soft, NT, ND, bowel sounds + Extremities: no edema, no cyanosis    The results of significant diagnostics from this hospitalization (including imaging, microbiology, ancillary and laboratory) are listed below for reference.     Microbiology: Recent Results (from the past 240 hour(s))  SARS Coronavirus 2 by RT PCR (hospital order, performed in [facility] hospital lab) Nasopharyngeal Nasopharyngeal Swab     Status: None   Collection Time: 02/28/20  9:46 PM   Specimen: Nasopharyngeal Swab  Result Value Ref Range Status   SARS Coronavirus 2 NEGATIVE NEGATIVE Final    Comment: (NOTE) SARS-CoV-2 target nucleic acids are NOT DETECTED.  The SARS-CoV-2 RNA is generally detectable in upper and lower respiratory specimens during the acute phase of infection. The lowest concentration of SARS-CoV-2 viral copies this assay can detect is 250 copies / mL. A negative result does not preclude SARS-CoV-2 infection and should not be used as the sole basis for treatment or other patient management decisions.  A negative result may occur with improper specimen collection / handling, submission of specimen other than nasopharyngeal swab, presence of viral mutation(s) within the areas targeted by this assay, and inadequate number of viral copies (<250 copies / mL). A negative result  must be combined with clinical observations, patient history, and epidemiological information.  Fact Sheet for Patients:   StrictlyIdeas.no  Fact Sheet for Healthcare Providers: BankingDealers.co.za  This test is not yet approved or  cleared by the [place] FDA and has been authorized for detection and/or diagnosis of SARS-CoV-2 by FDA under an Emergency Use Authorization (EUA).  This EUA will remain in effect (meaning this test can be used) for the duration of the COVID-19 declaration under Section 564(b)(1) of the Act, 21 U.S.C. section 360bbb-3(b)(1), unless the authorization is terminated or revoked sooner.  Performed at [facility], [place] [address], [place], [place] [address]      Labs: BNP (last 3 results) Recent Labs    02/28/20 1452  BNP 93.8   Basic Metabolic Panel: Recent Labs  Lab 02/28/20 1452 02/29/20 0745  NA 125* 131*  K 4.2 3.7  CL 83* 90*  CO2 30 34*  GLUCOSE 137* 123*  BUN 10 7  CREATININE 0.90 0.85  CALCIUM 8.1* 8.3*   Liver Function Tests: Recent Labs  Lab 02/29/20 0745  AST 37  ALT 20  ALKPHOS 79  BILITOT 0.8  PROT 7.3  ALBUMIN 3.2*   No results for input(s): LIPASE, AMYLASE in the last 168 hours. No results for input(s): AMMONIA in the last 168 hours. CBC: Recent Labs  Lab 02/28/20 1452  WBC 7.5  HGB 12.6  HCT 38.6  MCV 88.1  PLT 174   Cardiac Enzymes: No results for input(s): CKTOTAL, CKMB, CKMBINDEX, TROPONINI in the last 168 hours. BNP: Invalid input(s): POCBNP CBG: Recent Labs  Lab 02/29/20 0020 02/29/20 0609 02/29/20 1102  GLUCAP 115* 119* 194*   D-Dimer No results for input(s): DDIMER in  the last 72 hours. Hgb A1c No results for input(s): HGBA1C in the last 72 hours. Lipid Profile No results for input(s): CHOL, HDL, LDLCALC, TRIG, CHOLHDL, LDLDIRECT in the last 72 hours. Thyroid function studies Recent Labs    02/29/20 0745  TSH 1.721   Anemia  work up No results for input(s): VITAMINB12, FOLATE, FERRITIN, TIBC, IRON, RETICCTPCT in the last 72 hours. Urinalysis    Component Value Date/Time   COLORURINE STRAW (A) 02/28/2020 1937   APPEARANCEUR CLEAR 02/28/2020 1937   LABSPEC 1.005 02/28/2020 1937   PHURINE 6.0 02/28/2020 1937   GLUCOSEU NEGATIVE 02/28/2020 1937   HGBUR NEGATIVE 02/28/2020 1937   BILIRUBINUR NEGATIVE 02/28/2020 1937   KETONESUR NEGATIVE 02/28/2020 1937   PROTEINUR NEGATIVE 02/28/2020 1937   UROBILINOGEN 0.2 12/09/2014 0840   NITRITE NEGATIVE 02/28/2020 1937   LEUKOCYTESUR NEGATIVE 02/28/2020 1937   Sepsis Labs Invalid input(s): PROCALCITONIN,  WBC,  LACTICIDVEN Microbiology Recent Results (from the past 240 hour(s))  SARS Coronavirus 2 by RT PCR (hospital order, performed in [facility] hospital lab) Nasopharyngeal Nasopharyngeal Swab     Status: None   Collection Time: 02/28/20  9:46 PM   Specimen: Nasopharyngeal Swab  Result Value Ref Range Status   SARS Coronavirus 2 NEGATIVE NEGATIVE Final    Comment: (NOTE) SARS-CoV-2 target nucleic acids are NOT DETECTED.  The SARS-CoV-2 RNA is generally detectable in upper and lower respiratory specimens during the acute phase of infection. The lowest concentration of SARS-CoV-2 viral copies this assay can detect is 250 copies / mL. A negative result does not preclude SARS-CoV-2 infection and should not be used as the sole basis for treatment or other patient management decisions.  A negative result may occur with improper specimen collection / handling, submission of specimen other than nasopharyngeal swab, presence of viral mutation(s) within the areas targeted by this assay, and inadequate number of viral copies (<250 copies / mL). A negative result must be combined with clinical observations, patient history, and epidemiological information.  Fact Sheet for Patients:   StrictlyIdeas.no  Fact Sheet for Healthcare  Providers: BankingDealers.co.za  This test is not yet approved or  cleared by the [place] FDA and has been authorized for detection and/or diagnosis of SARS-CoV-2 by FDA under an Emergency Use Authorization (EUA).  This EUA will remain in effect (meaning this test can be used) for the duration of the COVID-19 declaration under Section 564(b)(1) of the Act, 21 U.S.C. section 360bbb-3(b)(1), unless the authorization is terminated or revoked sooner.  Performed at [facility], [place] [address] [place], [place] [address]      Time coordinating discharge: Over 30 minutes  SIGNED:   Darliss Cheney, MD  [facility] 02/29/2020, 11:06 AM  If 7PM-7AM, please contact night-coverage [email or web address]

## 2020-03-01 LAB — HEMOGLOBIN A1C
Hgb A1c MFr Bld: 5.8 % — ABNORMAL HIGH (ref 4.8–5.6)
Mean Plasma Glucose: 120 mg/dL

## 2020-03-15 ENCOUNTER — Encounter (HOSPITAL_COMMUNITY): Payer: Self-pay

## 2020-03-15 ENCOUNTER — Emergency Department (HOSPITAL_COMMUNITY): Payer: Medicare Other

## 2020-03-15 ENCOUNTER — Other Ambulatory Visit: Payer: Self-pay

## 2020-03-15 ENCOUNTER — Emergency Department (HOSPITAL_COMMUNITY)
Admission: EM | Admit: 2020-03-15 | Discharge: 2020-03-15 | Disposition: A | Discharge status: Home / Self Care | Payer: Medicare Other | Attending: Emergency Medicine | Admitting: Emergency Medicine

## 2020-03-15 DIAGNOSIS — Z7984 Long term (current) use of oral hypoglycemic drugs: Secondary | ICD-10-CM | POA: Diagnosis not present

## 2020-03-15 DIAGNOSIS — R1084 Generalized abdominal pain: Secondary | ICD-10-CM | POA: Insufficient documentation

## 2020-03-15 DIAGNOSIS — R109 Unspecified abdominal pain: Secondary | ICD-10-CM | POA: Diagnosis present

## 2020-03-15 DIAGNOSIS — Z79899 Other long term (current) drug therapy: Secondary | ICD-10-CM | POA: Insufficient documentation

## 2020-03-15 DIAGNOSIS — F17210 Nicotine dependence, cigarettes, uncomplicated: Secondary | ICD-10-CM | POA: Insufficient documentation

## 2020-03-15 DIAGNOSIS — Z8679 Personal history of other diseases of the circulatory system: Secondary | ICD-10-CM | POA: Insufficient documentation

## 2020-03-15 DIAGNOSIS — I10 Essential (primary) hypertension: Secondary | ICD-10-CM | POA: Diagnosis not present

## 2020-03-15 LAB — CBC
HCT: 42.4 % (ref 36.0–46.0)
Hemoglobin: 13.8 g/dL (ref 12.0–15.0)
MCH: 28.9 pg (ref 26.0–34.0)
MCHC: 32.5 g/dL (ref 30.0–36.0)
MCV: 88.9 fL (ref 80.0–100.0)
Platelets: 188 10*3/uL (ref 150–400)
RBC: 4.77 MIL/uL (ref 3.87–5.11)
RDW: 13.2 % (ref 11.5–15.5)
WBC: 10.5 10*3/uL (ref 4.0–10.5)
nRBC: 0 % (ref 0.0–0.2)

## 2020-03-15 LAB — COMPREHENSIVE METABOLIC PANEL
ALT: 20 U/L (ref 0–44)
AST: 30 U/L (ref 15–41)
Albumin: 3.8 g/dL (ref 3.5–5.0)
Alkaline Phosphatase: 81 U/L (ref 38–126)
Anion gap: 10 (ref 5–15)
BUN: 8 mg/dL (ref 6–20)
CO2: 25 mmol/L (ref 22–32)
Calcium: 8.5 mg/dL — ABNORMAL LOW (ref 8.9–10.3)
Chloride: 102 mmol/L (ref 98–111)
Creatinine, Ser: 0.85 mg/dL (ref 0.44–1.00)
GFR calc Af Amer: >60 mL/min (ref >60)
GFR calc non Af Amer: >60 mL/min (ref >60)
Glucose, Bld: 98 mg/dL (ref 70–99)
Potassium: 4 mmol/L (ref 3.5–5.1)
Sodium: 137 mmol/L (ref 135–145)
Total Bilirubin: 0.6 mg/dL (ref 0.3–1.2)
Total Protein: 8.1 g/dL (ref 6.5–8.1)

## 2020-03-15 LAB — URINALYSIS, ROUTINE W REFLEX MICROSCOPIC
Bilirubin Urine: NEGATIVE
Glucose, UA: NEGATIVE mg/dL
Hgb urine dipstick: NEGATIVE
Ketones, ur: NEGATIVE mg/dL
Leukocytes,Ua: NEGATIVE
Nitrite: NEGATIVE
Protein, ur: NEGATIVE mg/dL
Specific Gravity, Urine: 1.008 (ref 1.005–1.030)
pH: 5 (ref 5.0–8.0)

## 2020-03-15 LAB — LIPASE, BLOOD: Lipase: 39 U/L (ref 11–51)

## 2020-03-15 MED ORDER — ONDANSETRON HCL 4 MG PO TABS
4.0000 mg | ORAL_TABLET | Freq: Three times a day (TID) | ORAL | 0 refills | Status: DC | PRN
Start: 2020-03-15 — End: 2022-06-02

## 2020-03-15 MED ORDER — ONDANSETRON HCL 4 MG/2ML IJ SOLN
4.0000 mg | Freq: Once | INTRAMUSCULAR | Status: AC
  Administered 2020-03-15: 4 mg via INTRAVENOUS
  Filled 2020-03-15: qty 2

## 2020-03-15 MED ORDER — OXYCODONE HCL 15 MG PO TABS: 15.0000 mg | ORAL_TABLET | ORAL | 0 refills | Status: DC | PRN

## 2020-03-15 MED ORDER — SODIUM CHLORIDE 0.9% FLUSH: 3.0000 mL | Freq: Once | INTRAVENOUS | Status: DC

## 2020-03-15 MED ORDER — IOHEXOL 300 MG/ML  SOLN
100.0000 mL | Freq: Once | INTRAMUSCULAR | Status: AC | PRN
  Administered 2020-03-15: 100 mL via INTRAVENOUS

## 2020-03-15 MED ORDER — SODIUM CHLORIDE 0.9 % IV BOLUS
1000.0000 mL | Freq: Once | INTRAVENOUS | Status: AC
  Administered 2020-03-15: 1000 mL via INTRAVENOUS

## 2020-03-15 MED ORDER — HYDROMORPHONE HCL 1 MG/ML IJ SOLN
1.0000 mg | Freq: Once | INTRAMUSCULAR | Status: AC
  Administered 2020-03-15: 1 mg via INTRAVENOUS
  Filled 2020-03-15: qty 1

## 2020-03-15 NOTE — ED Triage Notes (Signed)
Patient c/o right lower abdominal pain and states she feels bloated. Patient states she has had "6 gas pills" and is unable to pass gas.
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
## 2020-04-02 ENCOUNTER — Emergency Department (HOSPITAL_COMMUNITY)
Admission: EM | Admit: 2020-04-02 | Discharge: 2020-04-02 | Discharge status: Against Medical Advice | Payer: Medicare Other

## 2020-04-02 ENCOUNTER — Other Ambulatory Visit: Payer: Self-pay

## 2020-04-02 NOTE — ED Notes (Addendum)
Pt called for in ED lobby for triage, no answer x1. [employer]

## 2020-04-02 NOTE — ED Notes (Signed)
Pt eloped prior to triage. Called 3X

## 2020-06-06 ENCOUNTER — Other Ambulatory Visit: Payer: Self-pay | Admitting: Family Medicine

## 2020-06-06 DIAGNOSIS — Z1231 Encounter for screening mammogram for malignant neoplasm of breast: Secondary | ICD-10-CM

## 2020-06-28 ENCOUNTER — Other Ambulatory Visit: Payer: Self-pay

## 2020-06-28 ENCOUNTER — Ambulatory Visit
Admission: RE | Admit: 2020-06-28 | Discharge: 2020-06-28 | Disposition: A | Discharge status: Home / Self Care | Payer: Medicare Other | Source: Ambulatory Visit | Attending: Family Medicine | Admitting: Family Medicine

## 2020-06-28 DIAGNOSIS — Z1231 Encounter for screening mammogram for malignant neoplasm of breast: Secondary | ICD-10-CM

## 2020-09-13 ENCOUNTER — Emergency Department (HOSPITAL_COMMUNITY)
Admission: EM | Admit: 2020-09-13 | Discharge: 2020-09-13 | Disposition: A | Discharge status: Home / Self Care | Payer: Medicare Other | Attending: Emergency Medicine | Admitting: Emergency Medicine

## 2020-09-13 ENCOUNTER — Other Ambulatory Visit: Payer: Self-pay

## 2020-09-13 DIAGNOSIS — Z7951 Long term (current) use of inhaled steroids: Secondary | ICD-10-CM | POA: Insufficient documentation

## 2020-09-13 DIAGNOSIS — G8929 Other chronic pain: Secondary | ICD-10-CM | POA: Diagnosis not present

## 2020-09-13 DIAGNOSIS — I10 Essential (primary) hypertension: Secondary | ICD-10-CM | POA: Insufficient documentation

## 2020-09-13 DIAGNOSIS — T402X1A Poisoning by other opioids, accidental (unintentional), initial encounter: Secondary | ICD-10-CM | POA: Insufficient documentation

## 2020-09-13 DIAGNOSIS — E1159 Type 2 diabetes mellitus with other circulatory complications: Secondary | ICD-10-CM | POA: Diagnosis not present

## 2020-09-13 DIAGNOSIS — R000 Tachycardia, unspecified: Secondary | ICD-10-CM | POA: Insufficient documentation

## 2020-09-13 DIAGNOSIS — Z79899 Other long term (current) drug therapy: Secondary | ICD-10-CM | POA: Diagnosis not present

## 2020-09-13 DIAGNOSIS — F1123 Opioid dependence with withdrawal: Secondary | ICD-10-CM | POA: Insufficient documentation

## 2020-09-13 DIAGNOSIS — Z951 Presence of aortocoronary bypass graft: Secondary | ICD-10-CM | POA: Diagnosis not present

## 2020-09-13 DIAGNOSIS — R110 Nausea: Secondary | ICD-10-CM | POA: Insufficient documentation

## 2020-09-13 DIAGNOSIS — F1193 Opioid use, unspecified with withdrawal: Secondary | ICD-10-CM

## 2020-09-13 DIAGNOSIS — I2510 Atherosclerotic heart disease of native coronary artery without angina pectoris: Secondary | ICD-10-CM | POA: Diagnosis not present

## 2020-09-13 DIAGNOSIS — F17210 Nicotine dependence, cigarettes, uncomplicated: Secondary | ICD-10-CM | POA: Diagnosis not present

## 2020-09-13 DIAGNOSIS — J441 Chronic obstructive pulmonary disease with (acute) exacerbation: Secondary | ICD-10-CM | POA: Insufficient documentation

## 2020-09-13 DIAGNOSIS — E669 Obesity, unspecified: Secondary | ICD-10-CM | POA: Diagnosis not present

## 2020-09-13 DIAGNOSIS — J45909 Unspecified asthma, uncomplicated: Secondary | ICD-10-CM | POA: Insufficient documentation

## 2020-09-13 DIAGNOSIS — M549 Dorsalgia, unspecified: Secondary | ICD-10-CM | POA: Diagnosis not present

## 2020-09-13 DIAGNOSIS — E1169 Type 2 diabetes mellitus with other specified complication: Secondary | ICD-10-CM | POA: Insufficient documentation

## 2020-09-13 DIAGNOSIS — Z7984 Long term (current) use of oral hypoglycemic drugs: Secondary | ICD-10-CM | POA: Diagnosis not present

## 2020-09-13 DIAGNOSIS — R531 Weakness: Secondary | ICD-10-CM | POA: Diagnosis not present

## 2020-09-13 MED ORDER — ONDANSETRON HCL 4 MG/2ML IJ SOLN
4.0000 mg | Freq: Once | INTRAMUSCULAR | Status: AC
  Administered 2020-09-13: 4 mg via INTRAVENOUS
  Filled 2020-09-13: qty 2

## 2020-09-13 MED ORDER — SODIUM CHLORIDE 0.9 % IV BOLUS
500.0000 mL | Freq: Once | INTRAVENOUS | Status: AC
  Administered 2020-09-13: 500 mL via INTRAVENOUS

## 2020-09-13 MED ORDER — OXYCODONE HCL 5 MG PO TABS
15.0000 mg | ORAL_TABLET | Freq: Once | ORAL | Status: AC
  Administered 2020-09-13: 15 mg via ORAL
  Filled 2020-09-13: qty 3

## 2020-09-13 NOTE — Discharge Instructions (Addendum)
You are brought into the emergency department after an accidental overdose of your oxycodone at home.  You gave yourself Norco, which caused you to go into immediate withdrawal.  After watching in the emergency department for period of time, I felt it was safe to discharge at home.  Please be extra careful with the dosing of your oxycodone at home.  Overdoses can be extremely dangerous, and can lead to coma and death.  Norco can cause withdrawal symptoms like diarrhea, runny nose, irritability, muscle aches.  We gave you dose of oxycodone in the ER.  You should not take extra oxycodone tonight.  You should go back to your normal routine tomorrow.

## 2020-09-13 NOTE — ED Notes (Signed)
Pt d/c by MD and is provided w/ d/c instructions and follow up care, Pt is out of the ED in wheel chair

## 2020-09-13 NOTE — ED Triage Notes (Signed)
Pt arrives to ED BIB [facility] due to weakness after first time use of Narcan. Per EMS pt has chronic back and stomach pain and took "a little extra pain medication" to help. Per EMS pt self administered Narcan 56m IN due to experiencing SHOB, Dizziness and N/V. Pt a/o x4.  BP 140/80 HR 80 O2 96% 4L [place] CBG 135
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
## 2020-09-19 DIAGNOSIS — E119 Type 2 diabetes mellitus without complications: Secondary | ICD-10-CM | POA: Diagnosis not present

## 2020-09-19 DIAGNOSIS — G43909 Migraine, unspecified, not intractable, without status migrainosus: Secondary | ICD-10-CM | POA: Diagnosis not present

## 2020-09-19 DIAGNOSIS — K7460 Unspecified cirrhosis of liver: Secondary | ICD-10-CM | POA: Diagnosis not present

## 2020-09-19 DIAGNOSIS — I2510 Atherosclerotic heart disease of native coronary artery without angina pectoris: Secondary | ICD-10-CM | POA: Diagnosis not present

## 2020-09-19 DIAGNOSIS — I10 Essential (primary) hypertension: Secondary | ICD-10-CM | POA: Diagnosis not present

## 2020-10-26 DIAGNOSIS — L03119 Cellulitis of unspecified part of limb: Secondary | ICD-10-CM | POA: Diagnosis not present

## 2020-10-26 DIAGNOSIS — R600 Localized edema: Secondary | ICD-10-CM | POA: Diagnosis not present

## 2020-10-26 DIAGNOSIS — F17210 Nicotine dependence, cigarettes, uncomplicated: Secondary | ICD-10-CM | POA: Diagnosis not present

## 2020-10-29 DIAGNOSIS — R600 Localized edema: Secondary | ICD-10-CM | POA: Diagnosis not present

## 2020-10-29 DIAGNOSIS — L03119 Cellulitis of unspecified part of limb: Secondary | ICD-10-CM | POA: Diagnosis not present

## 2020-10-31 DIAGNOSIS — L03119 Cellulitis of unspecified part of limb: Secondary | ICD-10-CM | POA: Diagnosis not present

## 2020-12-08 DIAGNOSIS — M5416 Radiculopathy, lumbar region: Secondary | ICD-10-CM | POA: Diagnosis not present

## 2020-12-14 DIAGNOSIS — M5459 Other low back pain: Secondary | ICD-10-CM | POA: Diagnosis not present

## 2021-02-08 ENCOUNTER — Encounter (HOSPITAL_COMMUNITY): Payer: Self-pay

## 2021-02-08 ENCOUNTER — Emergency Department (HOSPITAL_COMMUNITY): Payer: Medicare Other

## 2021-02-08 ENCOUNTER — Other Ambulatory Visit: Payer: Self-pay

## 2021-02-08 ENCOUNTER — Emergency Department (HOSPITAL_COMMUNITY)
Admission: EM | Admit: 2021-02-08 | Discharge: 2021-02-08 | Disposition: A | Discharge status: Home / Self Care | Payer: Medicare Other | Attending: Emergency Medicine | Admitting: Emergency Medicine

## 2021-02-08 DIAGNOSIS — Z7984 Long term (current) use of oral hypoglycemic drugs: Secondary | ICD-10-CM | POA: Diagnosis not present

## 2021-02-08 DIAGNOSIS — J441 Chronic obstructive pulmonary disease with (acute) exacerbation: Secondary | ICD-10-CM | POA: Insufficient documentation

## 2021-02-08 DIAGNOSIS — R109 Unspecified abdominal pain: Secondary | ICD-10-CM | POA: Insufficient documentation

## 2021-02-08 DIAGNOSIS — I2510 Atherosclerotic heart disease of native coronary artery without angina pectoris: Secondary | ICD-10-CM | POA: Diagnosis not present

## 2021-02-08 DIAGNOSIS — R2243 Localized swelling, mass and lump, lower limb, bilateral: Secondary | ICD-10-CM | POA: Insufficient documentation

## 2021-02-08 DIAGNOSIS — E871 Hypo-osmolality and hyponatremia: Secondary | ICD-10-CM | POA: Diagnosis not present

## 2021-02-08 DIAGNOSIS — E1169 Type 2 diabetes mellitus with other specified complication: Secondary | ICD-10-CM | POA: Insufficient documentation

## 2021-02-08 DIAGNOSIS — Z96652 Presence of left artificial knee joint: Secondary | ICD-10-CM | POA: Insufficient documentation

## 2021-02-08 DIAGNOSIS — J45909 Unspecified asthma, uncomplicated: Secondary | ICD-10-CM | POA: Diagnosis not present

## 2021-02-08 DIAGNOSIS — R140 Abdominal distension (gaseous): Secondary | ICD-10-CM | POA: Insufficient documentation

## 2021-02-08 DIAGNOSIS — F17210 Nicotine dependence, cigarettes, uncomplicated: Secondary | ICD-10-CM | POA: Insufficient documentation

## 2021-02-08 DIAGNOSIS — Z79899 Other long term (current) drug therapy: Secondary | ICD-10-CM | POA: Diagnosis not present

## 2021-02-08 DIAGNOSIS — K760 Fatty (change of) liver, not elsewhere classified: Secondary | ICD-10-CM | POA: Diagnosis not present

## 2021-02-08 DIAGNOSIS — R2240 Localized swelling, mass and lump, unspecified lower limb: Secondary | ICD-10-CM | POA: Diagnosis present

## 2021-02-08 DIAGNOSIS — Z7951 Long term (current) use of inhaled steroids: Secondary | ICD-10-CM | POA: Diagnosis not present

## 2021-02-08 DIAGNOSIS — M7989 Other specified soft tissue disorders: Secondary | ICD-10-CM | POA: Diagnosis not present

## 2021-02-08 DIAGNOSIS — R059 Cough, unspecified: Secondary | ICD-10-CM | POA: Diagnosis not present

## 2021-02-08 DIAGNOSIS — I10 Essential (primary) hypertension: Secondary | ICD-10-CM | POA: Diagnosis not present

## 2021-02-08 DIAGNOSIS — R0602 Shortness of breath: Secondary | ICD-10-CM | POA: Diagnosis not present

## 2021-02-08 LAB — CBC WITH DIFFERENTIAL/PLATELET
Abs Immature Granulocytes: 0.02 10*3/uL (ref 0.00–0.07)
Basophils Absolute: 0 10*3/uL (ref 0.0–0.1)
Basophils Relative: 0 %
Eosinophils Absolute: 0.2 10*3/uL (ref 0.0–0.5)
Eosinophils Relative: 3 %
HCT: 35.7 % — ABNORMAL LOW (ref 36.0–46.0)
Hemoglobin: 11.7 g/dL — ABNORMAL LOW (ref 12.0–15.0)
Immature Granulocytes: 0 %
Lymphocytes Relative: 27 %
Lymphs Abs: 2 10*3/uL (ref 0.7–4.0)
MCH: 26.5 pg (ref 26.0–34.0)
MCHC: 32.8 g/dL (ref 30.0–36.0)
MCV: 81 fL (ref 80.0–100.0)
Monocytes Absolute: 0.7 10*3/uL (ref 0.1–1.0)
Monocytes Relative: 9 %
Neutro Abs: 4.5 10*3/uL (ref 1.7–7.7)
Neutrophils Relative %: 61 %
Platelets: 167 10*3/uL (ref 150–400)
RBC: 4.41 MIL/uL (ref 3.87–5.11)
RDW: 16.2 % — ABNORMAL HIGH (ref 11.5–15.5)
WBC: 7.4 10*3/uL (ref 4.0–10.5)
nRBC: 0 % (ref 0.0–0.2)

## 2021-02-08 LAB — COMPREHENSIVE METABOLIC PANEL
ALT: 23 U/L (ref 0–44)
AST: 44 U/L — ABNORMAL HIGH (ref 15–41)
Albumin: 3.2 g/dL — ABNORMAL LOW (ref 3.5–5.0)
Alkaline Phosphatase: 100 U/L (ref 38–126)
Anion gap: 9 (ref 5–15)
BUN: 14 mg/dL (ref 8–23)
CO2: 33 mmol/L — ABNORMAL HIGH (ref 22–32)
Calcium: 8.3 mg/dL — ABNORMAL LOW (ref 8.9–10.3)
Chloride: 85 mmol/L — ABNORMAL LOW (ref 98–111)
Creatinine, Ser: 1 mg/dL (ref 0.44–1.00)
GFR, Estimated: >60 mL/min (ref >60)
Glucose, Bld: 187 mg/dL — ABNORMAL HIGH (ref 70–99)
Potassium: 4.3 mmol/L (ref 3.5–5.1)
Sodium: 127 mmol/L — ABNORMAL LOW (ref 135–145)
Total Bilirubin: 0.6 mg/dL (ref 0.3–1.2)
Total Protein: 7.4 g/dL (ref 6.5–8.1)

## 2021-02-08 LAB — BRAIN NATRIURETIC PEPTIDE: B Natriuretic Peptide: 57.1 pg/mL (ref 0.0–100.0)

## 2021-02-08 LAB — TROPONIN I (HIGH SENSITIVITY)
Troponin I (High Sensitivity): 6 ng/L (ref <18)
Troponin I (High Sensitivity): 6 ng/L (ref <18)

## 2021-02-08 LAB — LIPASE, BLOOD: Lipase: 35 U/L (ref 11–51)

## 2021-02-08 MED ORDER — FUROSEMIDE 20 MG PO TABS
40.0000 mg | ORAL_TABLET | Freq: Once | ORAL | Status: AC
  Administered 2021-02-08: 40 mg via ORAL
  Filled 2021-02-08: qty 2

## 2021-02-08 MED ORDER — OXYCODONE HCL 5 MG PO TABS
15.0000 mg | ORAL_TABLET | Freq: Once | ORAL | Status: AC
Start: 2021-02-08 — End: 2021-02-08
  Administered 2021-02-08: 15 mg via ORAL
  Filled 2021-02-08: qty 3

## 2021-02-08 NOTE — ED Triage Notes (Addendum)
Pt complains of lower extremity edema, shortness of breath, cough, abdominal pain.  Seen by PCP told to come here to emergency department.  She has been taking torsemide twice daily without relief.  Her normal weight is 233.  Cough is nonproductive.
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
## 2021-02-08 NOTE — Discharge Instructions (Addendum)
Continue take your diuresis medicines as prescribed.  Torsemide.  Cut back on the water a little bit, and follow-up with your doctor for repeat laboratory studies.  Return to [place] with any concerning changes.

## 2021-02-08 NOTE — ED Provider Notes (Addendum)
Emergency Medicine Provider Triage Evaluation Note  [name] , a 60 y.o. female  was evaluated in triage.  Pt complains of lower extremity edema, shortness of breath, cough, abdominal pain.  Seen by PCP told to come here to emergency department.  She has been taking torsemide twice daily without relief.  Her normal weight is 233.  Cough is nonproductive.  She is supposed to be on home oxygen however cannot afford this.  She continues to use tobacco.  History of heart failure as well as ascites.  No recent injury or trauma.  She feels like she has a "bowel blockage."  She has history of obstructions.  She feels like she is constipated.  She cannot remember the last time she had a bowel movement.  She has felt warm has not taken her temperature.  Recent COVID exposure.  She feels like she wants to vomit however feels like she cannot get anything out.  No abdominal pain or chest pain radiating to back.  No prior history of AAA or dissection.  Review of Systems  Positive: Abdominal pain, constipation, cough, shortness of breath, chest tightness, lower extremity edema Negative: Paresthesias, lateral weakness, collaborate with the left  Physical Exam  There were no vitals taken for this visit. Gen:   Awake, no distress   Resp:  Normal effort  MSK:   Moves extremities without difficulty  ABD:  Soft, nontender Other:    Medical Decision Making  Medically screening exam initiated at 1:44 PM.  Appropriate orders placed.  YSABELA KEISLER was informed that the remainder of the evaluation will be completed by another provider, this initial triage assessment does not replace that evaluation, and the importance of remaining in the ED until their evaluation is complete.  Shortness of breath, lower extremity edema, abdominal pain, constipation  Labs and orders placed.   Ephriam Turman A, PA-C 02/08/21 1351    Nazario Russom A, PA-C 02/08/21 1352    Varney Biles, MD 02/08/21 1710

## 2021-02-12 DIAGNOSIS — K5900 Constipation, unspecified: Secondary | ICD-10-CM | POA: Diagnosis not present

## 2021-02-12 DIAGNOSIS — R600 Localized edema: Secondary | ICD-10-CM | POA: Diagnosis not present

## 2021-02-12 DIAGNOSIS — M898X1 Other specified disorders of bone, shoulder: Secondary | ICD-10-CM | POA: Diagnosis not present

## 2021-02-12 DIAGNOSIS — L03115 Cellulitis of right lower limb: Secondary | ICD-10-CM | POA: Diagnosis not present

## 2021-02-12 DIAGNOSIS — L03116 Cellulitis of left lower limb: Secondary | ICD-10-CM | POA: Diagnosis not present

## 2021-02-12 DIAGNOSIS — F17210 Nicotine dependence, cigarettes, uncomplicated: Secondary | ICD-10-CM | POA: Diagnosis not present

## 2021-02-12 DIAGNOSIS — R110 Nausea: Secondary | ICD-10-CM | POA: Diagnosis not present

## 2021-02-14 DIAGNOSIS — M5459 Other low back pain: Secondary | ICD-10-CM | POA: Diagnosis not present

## 2021-02-14 DIAGNOSIS — L03119 Cellulitis of unspecified part of limb: Secondary | ICD-10-CM | POA: Diagnosis not present

## 2021-02-14 DIAGNOSIS — R600 Localized edema: Secondary | ICD-10-CM | POA: Diagnosis not present

## 2021-02-17 ENCOUNTER — Emergency Department (HOSPITAL_COMMUNITY)
Admission: EM | Admit: 2021-02-17 | Discharge: 2021-02-17 | Disposition: A | Discharge status: Home / Self Care | Payer: Medicare Other | Attending: Emergency Medicine | Admitting: Emergency Medicine

## 2021-02-17 ENCOUNTER — Encounter (HOSPITAL_COMMUNITY): Payer: Self-pay | Admitting: Emergency Medicine

## 2021-02-17 DIAGNOSIS — I872 Venous insufficiency (chronic) (peripheral): Secondary | ICD-10-CM | POA: Insufficient documentation

## 2021-02-17 DIAGNOSIS — R21 Rash and other nonspecific skin eruption: Secondary | ICD-10-CM | POA: Diagnosis present

## 2021-02-17 DIAGNOSIS — E1169 Type 2 diabetes mellitus with other specified complication: Secondary | ICD-10-CM | POA: Diagnosis not present

## 2021-02-17 DIAGNOSIS — Z79899 Other long term (current) drug therapy: Secondary | ICD-10-CM | POA: Diagnosis not present

## 2021-02-17 DIAGNOSIS — F17210 Nicotine dependence, cigarettes, uncomplicated: Secondary | ICD-10-CM | POA: Insufficient documentation

## 2021-02-17 DIAGNOSIS — Z7984 Long term (current) use of oral hypoglycemic drugs: Secondary | ICD-10-CM | POA: Insufficient documentation

## 2021-02-17 DIAGNOSIS — E7800 Pure hypercholesterolemia, unspecified: Secondary | ICD-10-CM | POA: Diagnosis not present

## 2021-02-17 DIAGNOSIS — J45909 Unspecified asthma, uncomplicated: Secondary | ICD-10-CM | POA: Diagnosis not present

## 2021-02-17 DIAGNOSIS — J449 Chronic obstructive pulmonary disease, unspecified: Secondary | ICD-10-CM | POA: Diagnosis not present

## 2021-02-17 DIAGNOSIS — Z96652 Presence of left artificial knee joint: Secondary | ICD-10-CM | POA: Insufficient documentation

## 2021-02-17 LAB — CBC WITH DIFFERENTIAL/PLATELET
Abs Immature Granulocytes: 0.04 10*3/uL (ref 0.00–0.07)
Basophils Absolute: 0 10*3/uL (ref 0.0–0.1)
Basophils Relative: 0 %
Eosinophils Absolute: 0.4 10*3/uL (ref 0.0–0.5)
Eosinophils Relative: 5 %
HCT: 34.3 % — ABNORMAL LOW (ref 36.0–46.0)
Hemoglobin: 10.9 g/dL — ABNORMAL LOW (ref 12.0–15.0)
Immature Granulocytes: 1 %
Lymphocytes Relative: 28 %
Lymphs Abs: 2 10*3/uL (ref 0.7–4.0)
MCH: 26.4 pg (ref 26.0–34.0)
MCHC: 31.8 g/dL (ref 30.0–36.0)
MCV: 83.1 fL (ref 80.0–100.0)
Monocytes Absolute: 0.7 10*3/uL (ref 0.1–1.0)
Monocytes Relative: 9 %
Neutro Abs: 4.2 10*3/uL (ref 1.7–7.7)
Neutrophils Relative %: 57 %
Platelets: 172 10*3/uL (ref 150–400)
RBC: 4.13 MIL/uL (ref 3.87–5.11)
RDW: 17.2 % — ABNORMAL HIGH (ref 11.5–15.5)
WBC: 7.2 10*3/uL (ref 4.0–10.5)
nRBC: 0 % (ref 0.0–0.2)

## 2021-02-17 LAB — COMPREHENSIVE METABOLIC PANEL
ALT: 58 U/L — ABNORMAL HIGH (ref 0–44)
AST: 160 U/L — ABNORMAL HIGH (ref 15–41)
Albumin: 3.2 g/dL — ABNORMAL LOW (ref 3.5–5.0)
Alkaline Phosphatase: 90 U/L (ref 38–126)
Anion gap: 10 (ref 5–15)
BUN: 8 mg/dL (ref 8–23)
CO2: 34 mmol/L — ABNORMAL HIGH (ref 22–32)
Calcium: 8.8 mg/dL — ABNORMAL LOW (ref 8.9–10.3)
Chloride: 86 mmol/L — ABNORMAL LOW (ref 98–111)
Creatinine, Ser: 0.8 mg/dL (ref 0.44–1.00)
GFR, Estimated: >60 mL/min (ref >60)
Glucose, Bld: 171 mg/dL — ABNORMAL HIGH (ref 70–99)
Potassium: 3.8 mmol/L (ref 3.5–5.1)
Sodium: 130 mmol/L — ABNORMAL LOW (ref 135–145)
Total Bilirubin: 0.4 mg/dL (ref 0.3–1.2)
Total Protein: 7.5 g/dL (ref 6.5–8.1)

## 2021-02-17 LAB — LIPASE, BLOOD: Lipase: 34 U/L (ref 11–51)

## 2021-02-17 LAB — LACTIC ACID, PLASMA: Lactic Acid, Venous: 1.9 mmol/L (ref 0.5–1.9)

## 2021-02-17 MED ORDER — CEPHALEXIN 500 MG PO CAPS: 500.0000 mg | ORAL_CAPSULE | Freq: Four times a day (QID) | ORAL | 0 refills | Status: AC

## 2021-02-17 NOTE — Discharge Instructions (Signed)
See your Physician this week for recheck
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
## 2021-02-17 NOTE — ED Triage Notes (Signed)
Pt c/o rash to the bilateral legs, back and abdomen. She is unable to keep new antibiotic down.  States she has no support at home to get better.  She states provider told her to come in and get IV antibiotics.
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
## 2021-02-21 DIAGNOSIS — L03119 Cellulitis of unspecified part of limb: Secondary | ICD-10-CM | POA: Diagnosis not present

## 2021-03-11 ENCOUNTER — Other Ambulatory Visit: Payer: Self-pay | Admitting: Physician Assistant

## 2021-03-23 DIAGNOSIS — R109 Unspecified abdominal pain: Secondary | ICD-10-CM | POA: Diagnosis not present

## 2021-03-23 DIAGNOSIS — M5416 Radiculopathy, lumbar region: Secondary | ICD-10-CM | POA: Diagnosis not present

## 2021-03-23 DIAGNOSIS — M5136 Other intervertebral disc degeneration, lumbar region: Secondary | ICD-10-CM | POA: Diagnosis not present

## 2021-03-23 DIAGNOSIS — G894 Chronic pain syndrome: Secondary | ICD-10-CM | POA: Diagnosis not present

## 2021-03-26 ENCOUNTER — Emergency Department (HOSPITAL_COMMUNITY): Payer: Medicare Other

## 2021-03-26 ENCOUNTER — Encounter (HOSPITAL_COMMUNITY): Payer: Self-pay

## 2021-03-26 ENCOUNTER — Inpatient Hospital Stay (HOSPITAL_COMMUNITY)
Admission: EM | Admit: 2021-03-26 | Discharge: 2021-03-29 | DRG: 872 | Disposition: A | Discharge status: Home / Self Care | Payer: Medicare Other | Attending: Internal Medicine | Admitting: Internal Medicine

## 2021-03-26 ENCOUNTER — Other Ambulatory Visit: Payer: Self-pay

## 2021-03-26 DIAGNOSIS — A419 Sepsis, unspecified organism: Principal | ICD-10-CM

## 2021-03-26 DIAGNOSIS — Z811 Family history of alcohol abuse and dependence: Secondary | ICD-10-CM | POA: Diagnosis not present

## 2021-03-26 DIAGNOSIS — J449 Chronic obstructive pulmonary disease, unspecified: Secondary | ICD-10-CM | POA: Diagnosis present

## 2021-03-26 DIAGNOSIS — K529 Noninfective gastroenteritis and colitis, unspecified: Principal | ICD-10-CM

## 2021-03-26 DIAGNOSIS — D72829 Elevated white blood cell count, unspecified: Secondary | ICD-10-CM | POA: Diagnosis not present

## 2021-03-26 DIAGNOSIS — Z7951 Long term (current) use of inhaled steroids: Secondary | ICD-10-CM

## 2021-03-26 DIAGNOSIS — R112 Nausea with vomiting, unspecified: Secondary | ICD-10-CM | POA: Diagnosis present

## 2021-03-26 DIAGNOSIS — I10 Essential (primary) hypertension: Secondary | ICD-10-CM | POA: Diagnosis not present

## 2021-03-26 DIAGNOSIS — F17210 Nicotine dependence, cigarettes, uncomplicated: Secondary | ICD-10-CM | POA: Diagnosis present

## 2021-03-26 DIAGNOSIS — K760 Fatty (change of) liver, not elsewhere classified: Secondary | ICD-10-CM | POA: Diagnosis not present

## 2021-03-26 DIAGNOSIS — R188 Other ascites: Secondary | ICD-10-CM | POA: Diagnosis not present

## 2021-03-26 DIAGNOSIS — Z888 Allergy status to other drugs, medicaments and biological substances status: Secondary | ICD-10-CM

## 2021-03-26 DIAGNOSIS — R109 Unspecified abdominal pain: Secondary | ICD-10-CM | POA: Diagnosis not present

## 2021-03-26 DIAGNOSIS — Z823 Family history of stroke: Secondary | ICD-10-CM

## 2021-03-26 DIAGNOSIS — Z743 Need for continuous supervision: Secondary | ICD-10-CM | POA: Diagnosis not present

## 2021-03-26 DIAGNOSIS — E871 Hypo-osmolality and hyponatremia: Secondary | ICD-10-CM | POA: Diagnosis not present

## 2021-03-26 DIAGNOSIS — Z880 Allergy status to penicillin: Secondary | ICD-10-CM

## 2021-03-26 DIAGNOSIS — R6889 Other general symptoms and signs: Secondary | ICD-10-CM | POA: Diagnosis not present

## 2021-03-26 DIAGNOSIS — R17 Unspecified jaundice: Secondary | ICD-10-CM | POA: Diagnosis not present

## 2021-03-26 DIAGNOSIS — Z20822 Contact with and (suspected) exposure to covid-19: Secondary | ICD-10-CM | POA: Diagnosis present

## 2021-03-26 DIAGNOSIS — Z7901 Long term (current) use of anticoagulants: Secondary | ICD-10-CM | POA: Diagnosis not present

## 2021-03-26 DIAGNOSIS — I2510 Atherosclerotic heart disease of native coronary artery without angina pectoris: Secondary | ICD-10-CM | POA: Diagnosis present

## 2021-03-26 DIAGNOSIS — E1169 Type 2 diabetes mellitus with other specified complication: Secondary | ICD-10-CM | POA: Diagnosis not present

## 2021-03-26 DIAGNOSIS — Z8249 Family history of ischemic heart disease and other diseases of the circulatory system: Secondary | ICD-10-CM | POA: Diagnosis not present

## 2021-03-26 DIAGNOSIS — R197 Diarrhea, unspecified: Secondary | ICD-10-CM | POA: Diagnosis not present

## 2021-03-26 DIAGNOSIS — K7581 Nonalcoholic steatohepatitis (NASH): Secondary | ICD-10-CM | POA: Diagnosis present

## 2021-03-26 DIAGNOSIS — Z803 Family history of malignant neoplasm of breast: Secondary | ICD-10-CM | POA: Diagnosis not present

## 2021-03-26 DIAGNOSIS — E785 Hyperlipidemia, unspecified: Secondary | ICD-10-CM | POA: Diagnosis present

## 2021-03-26 DIAGNOSIS — Z881 Allergy status to other antibiotic agents status: Secondary | ICD-10-CM

## 2021-03-26 DIAGNOSIS — F419 Anxiety disorder, unspecified: Secondary | ICD-10-CM | POA: Diagnosis present

## 2021-03-26 DIAGNOSIS — I152 Hypertension secondary to endocrine disorders: Secondary | ICD-10-CM | POA: Diagnosis present

## 2021-03-26 DIAGNOSIS — Z6841 Body Mass Index (BMI) 40.0 and over, adult: Secondary | ICD-10-CM

## 2021-03-26 DIAGNOSIS — R202 Paresthesia of skin: Secondary | ICD-10-CM | POA: Diagnosis not present

## 2021-03-26 DIAGNOSIS — G8929 Other chronic pain: Secondary | ICD-10-CM | POA: Diagnosis present

## 2021-03-26 DIAGNOSIS — K922 Gastrointestinal hemorrhage, unspecified: Secondary | ICD-10-CM | POA: Diagnosis not present

## 2021-03-26 DIAGNOSIS — Z90710 Acquired absence of both cervix and uterus: Secondary | ICD-10-CM | POA: Diagnosis not present

## 2021-03-26 DIAGNOSIS — G4733 Obstructive sleep apnea (adult) (pediatric): Secondary | ICD-10-CM | POA: Diagnosis not present

## 2021-03-26 DIAGNOSIS — E860 Dehydration: Secondary | ICD-10-CM | POA: Diagnosis present

## 2021-03-26 DIAGNOSIS — N179 Acute kidney failure, unspecified: Secondary | ICD-10-CM

## 2021-03-26 DIAGNOSIS — K7460 Unspecified cirrhosis of liver: Secondary | ICD-10-CM | POA: Diagnosis not present

## 2021-03-26 DIAGNOSIS — E669 Obesity, unspecified: Secondary | ICD-10-CM | POA: Diagnosis not present

## 2021-03-26 DIAGNOSIS — N281 Cyst of kidney, acquired: Secondary | ICD-10-CM | POA: Diagnosis not present

## 2021-03-26 DIAGNOSIS — I959 Hypotension, unspecified: Secondary | ICD-10-CM | POA: Diagnosis not present

## 2021-03-26 DIAGNOSIS — Z79891 Long term (current) use of opiate analgesic: Secondary | ICD-10-CM

## 2021-03-26 DIAGNOSIS — R6520 Severe sepsis without septic shock: Secondary | ICD-10-CM

## 2021-03-26 DIAGNOSIS — Z79899 Other long term (current) drug therapy: Secondary | ICD-10-CM

## 2021-03-26 DIAGNOSIS — Z882 Allergy status to sulfonamides status: Secondary | ICD-10-CM

## 2021-03-26 LAB — POC OCCULT BLOOD, ED: Fecal Occult Bld: NEGATIVE

## 2021-03-26 LAB — CBC
HCT: 38.2 % (ref 36.0–46.0)
Hemoglobin: 12.2 g/dL (ref 12.0–15.0)
MCH: 25.9 pg — ABNORMAL LOW (ref 26.0–34.0)
MCHC: 31.9 g/dL (ref 30.0–36.0)
MCV: 81.1 fL (ref 80.0–100.0)
Platelets: 171 10*3/uL (ref 150–400)
RBC: 4.71 MIL/uL (ref 3.87–5.11)
RDW: 15.9 % — ABNORMAL HIGH (ref 11.5–15.5)
WBC: 12.6 10*3/uL — ABNORMAL HIGH (ref 4.0–10.5)
nRBC: 0 % (ref 0.0–0.2)

## 2021-03-26 LAB — URINALYSIS, COMPLETE (UACMP) WITH MICROSCOPIC
Bilirubin Urine: NEGATIVE
Glucose, UA: NEGATIVE mg/dL
Hgb urine dipstick: NEGATIVE
Ketones, ur: NEGATIVE mg/dL
Leukocytes,Ua: NEGATIVE
Nitrite: NEGATIVE
Protein, ur: NEGATIVE mg/dL
Specific Gravity, Urine: 1.017 (ref 1.005–1.030)
pH: 5 (ref 5.0–8.0)

## 2021-03-26 LAB — COMPREHENSIVE METABOLIC PANEL
ALT: 17 U/L (ref 0–44)
AST: 22 U/L (ref 15–41)
Albumin: 2.9 g/dL — ABNORMAL LOW (ref 3.5–5.0)
Alkaline Phosphatase: 66 U/L (ref 38–126)
Anion gap: 5 (ref 5–15)
BUN: 15 mg/dL (ref 8–23)
CO2: 29 mmol/L (ref 22–32)
Calcium: 7.8 mg/dL — ABNORMAL LOW (ref 8.9–10.3)
Chloride: 94 mmol/L — ABNORMAL LOW (ref 98–111)
Creatinine, Ser: 1.12 mg/dL — ABNORMAL HIGH (ref 0.44–1.00)
GFR, Estimated: 56 mL/min — ABNORMAL LOW (ref >60)
Glucose, Bld: 121 mg/dL — ABNORMAL HIGH (ref 70–99)
Potassium: 4.1 mmol/L (ref 3.5–5.1)
Sodium: 128 mmol/L — ABNORMAL LOW (ref 135–145)
Total Bilirubin: 1 mg/dL (ref 0.3–1.2)
Total Protein: 6.7 g/dL (ref 6.5–8.1)

## 2021-03-26 LAB — SODIUM, URINE, RANDOM: Sodium, Ur: 13 mmol/L

## 2021-03-26 LAB — RESP PANEL BY RT-PCR (FLU A&B, COVID) ARPGX2
Influenza A by PCR: NEGATIVE
Influenza B by PCR: NEGATIVE
SARS Coronavirus 2 by RT PCR: NEGATIVE

## 2021-03-26 LAB — PROTIME-INR
INR: 1.3 — ABNORMAL HIGH (ref 0.8–1.2)
Prothrombin Time: 15.7 seconds — ABNORMAL HIGH (ref 11.4–15.2)

## 2021-03-26 LAB — LACTIC ACID, PLASMA
Lactic Acid, Venous: 1.1 mmol/L (ref 0.5–1.9)
Lactic Acid, Venous: 1.3 mmol/L (ref 0.5–1.9)

## 2021-03-26 LAB — CREATININE, URINE, RANDOM: Creatinine, Urine: 62.23 mg/dL

## 2021-03-26 LAB — MAGNESIUM: Magnesium: 1.2 mg/dL — ABNORMAL LOW (ref 1.7–2.4)

## 2021-03-26 LAB — TYPE AND SCREEN
ABO/RH(D): B POS
Antibody Screen: NEGATIVE

## 2021-03-26 LAB — AMMONIA: Ammonia: 33 umol/L (ref 9–35)

## 2021-03-26 MED ORDER — PIPERACILLIN-TAZOBACTAM 3.375 G IVPB 30 MIN
3.3750 g | Freq: Once | INTRAVENOUS | Status: AC
  Administered 2021-03-26: 3.375 g via INTRAVENOUS
  Filled 2021-03-26: qty 50

## 2021-03-26 MED ORDER — ALPRAZOLAM 0.5 MG PO TABS
1.0000 mg | ORAL_TABLET | Freq: Two times a day (BID) | ORAL | Status: DC | PRN
  Administered 2021-03-26 – 2021-03-28 (×3): 1 mg via ORAL
  Filled 2021-03-26: qty 4
  Filled 2021-03-26 (×2): qty 2

## 2021-03-26 MED ORDER — ALBUTEROL SULFATE HFA 108 (90 BASE) MCG/ACT IN AERS
2.0000 | INHALATION_SPRAY | RESPIRATORY_TRACT | Status: DC | PRN
  Filled 2021-03-26: qty 6.7

## 2021-03-26 MED ORDER — LEVOCETIRIZINE DIHYDROCHLORIDE 5 MG PO TABS: 5.0000 mg | ORAL_TABLET | Freq: Every evening | ORAL | Status: DC

## 2021-03-26 MED ORDER — LORATADINE 10 MG PO TABS
10.0000 mg | ORAL_TABLET | Freq: Every day | ORAL | Status: DC
  Administered 2021-03-27 – 2021-03-29 (×3): 10 mg via ORAL
  Filled 2021-03-26 (×3): qty 1

## 2021-03-26 MED ORDER — ACETAMINOPHEN 325 MG PO TABS
650.0000 mg | ORAL_TABLET | Freq: Four times a day (QID) | ORAL | Status: DC | PRN
  Administered 2021-03-27 – 2021-03-28 (×4): 650 mg via ORAL
  Filled 2021-03-26 (×4): qty 2

## 2021-03-26 MED ORDER — HYDROMORPHONE HCL 1 MG/ML IJ SOLN: 0.5000 mg | INTRAMUSCULAR | Status: DC | PRN

## 2021-03-26 MED ORDER — SODIUM CHLORIDE 0.9 % IV BOLUS
1000.0000 mL | Freq: Once | INTRAVENOUS | Status: AC
  Administered 2021-03-26: 1000 mL via INTRAVENOUS

## 2021-03-26 MED ORDER — INSULIN ASPART 100 UNIT/ML IJ SOLN
0.0000 [IU] | Freq: Three times a day (TID) | INTRAMUSCULAR | Status: DC
  Administered 2021-03-27: 2 [IU] via SUBCUTANEOUS
  Administered 2021-03-28: 1 [IU] via SUBCUTANEOUS
  Administered 2021-03-28 – 2021-03-29 (×2): 2 [IU] via SUBCUTANEOUS

## 2021-03-26 MED ORDER — FENTANYL CITRATE (PF) 100 MCG/2ML IJ SOLN: 25.0000 ug | INTRAMUSCULAR | Status: DC | PRN

## 2021-03-26 MED ORDER — LACTATED RINGERS IV SOLN: INTRAVENOUS | Status: DC

## 2021-03-26 MED ORDER — HYDROMORPHONE HCL 1 MG/ML IJ SOLN
1.0000 mg | Freq: Once | INTRAMUSCULAR | Status: AC
  Administered 2021-03-26: 1 mg via INTRAVENOUS
  Filled 2021-03-26: qty 1

## 2021-03-26 MED ORDER — SIMVASTATIN 20 MG PO TABS
20.0000 mg | ORAL_TABLET | Freq: Every day | ORAL | Status: DC
  Administered 2021-03-26 – 2021-03-28 (×3): 20 mg via ORAL
  Filled 2021-03-26 (×3): qty 1

## 2021-03-26 MED ORDER — FENTANYL CITRATE (PF) 100 MCG/2ML IJ SOLN
50.0000 ug | Freq: Once | INTRAMUSCULAR | Status: AC
  Administered 2021-03-26: 50 ug via INTRAVENOUS
  Filled 2021-03-26: qty 2

## 2021-03-26 MED ORDER — IOHEXOL 350 MG/ML SOLN
100.0000 mL | Freq: Once | INTRAVENOUS | Status: AC | PRN
  Administered 2021-03-26: 100 mL via INTRAVENOUS

## 2021-03-26 MED ORDER — ONDANSETRON HCL 4 MG/2ML IJ SOLN
4.0000 mg | Freq: Four times a day (QID) | INTRAMUSCULAR | Status: DC | PRN
  Administered 2021-03-28 (×2): 4 mg via INTRAVENOUS
  Filled 2021-03-26 (×2): qty 2

## 2021-03-26 MED ORDER — MOMETASONE FURO-FORMOTEROL FUM 200-5 MCG/ACT IN AERO
2.0000 | INHALATION_SPRAY | Freq: Two times a day (BID) | RESPIRATORY_TRACT | Status: DC
  Administered 2021-03-26 – 2021-03-29 (×6): 2 via RESPIRATORY_TRACT
  Filled 2021-03-26: qty 8.8

## 2021-03-26 MED ORDER — NALOXONE HCL 0.4 MG/ML IJ SOLN: 0.4000 mg | INTRAMUSCULAR | Status: DC | PRN

## 2021-03-26 MED ORDER — ACETAMINOPHEN 650 MG RE SUPP: 650.0000 mg | Freq: Four times a day (QID) | RECTAL | Status: DC | PRN

## 2021-03-26 MED ORDER — ONDANSETRON HCL 4 MG/2ML IJ SOLN
4.0000 mg | Freq: Once | INTRAMUSCULAR | Status: AC
  Administered 2021-03-26: 4 mg via INTRAVENOUS
  Filled 2021-03-26: qty 2

## 2021-03-26 MED ORDER — PIPERACILLIN-TAZOBACTAM 3.375 G IVPB
3.3750 g | Freq: Three times a day (TID) | INTRAVENOUS | Status: DC
  Administered 2021-03-27 – 2021-03-28 (×4): 3.375 g via INTRAVENOUS
  Filled 2021-03-26 (×5): qty 50

## 2021-03-26 MED ORDER — PANTOPRAZOLE SODIUM 40 MG IV SOLR
40.0000 mg | Freq: Two times a day (BID) | INTRAVENOUS | Status: DC
  Administered 2021-03-26 – 2021-03-28 (×4): 40 mg via INTRAVENOUS
  Filled 2021-03-26 (×4): qty 40

## 2021-03-26 MED ORDER — ESCITALOPRAM OXALATE 20 MG PO TABS
20.0000 mg | ORAL_TABLET | Freq: Every evening | ORAL | Status: DC
  Administered 2021-03-26 – 2021-03-28 (×3): 20 mg via ORAL
  Filled 2021-03-26: qty 2
  Filled 2021-03-26 (×2): qty 1

## 2021-03-26 MED ORDER — HYDROMORPHONE HCL 1 MG/ML IJ SOLN
1.0000 mg | INTRAMUSCULAR | Status: DC | PRN
  Administered 2021-03-26 – 2021-03-29 (×24): 1 mg via INTRAVENOUS
  Filled 2021-03-26 (×24): qty 1

## 2021-03-26 MED ORDER — HYDROMORPHONE HCL 1 MG/ML IJ SOLN
1.0000 mg | Freq: Once | INTRAMUSCULAR | Status: AC
Start: 2021-03-26 — End: 2021-03-26
  Administered 2021-03-26: 1 mg via INTRAVENOUS
  Filled 2021-03-26: qty 1

## 2021-03-26 NOTE — Progress Notes (Signed)
ChronicBrief note regarding preliminary plan, with full H&P to follow:  60 year old female with abdominal pain who is admitted with colitis after presenting with acute on chronic abdominal discomfort associated with diarrhea over the last 3 to 4 days.  Initially, she conveys that her loose stools associated with bright red blood, although this is completely resolved over the last 2 days, with Hemoccult stool negative when performed in the ED today.  CT abdomen/pelvis shows evidence of colitis, with differential including ischemic versus infectious etiologies, in the absence of any evidence of perforation or abscess.  She was initially hypotensive upon presentation to the ED, but has responded well to IV fluids, with normotensive ensuing blood pressures.  Hemoglobin found to be stable, and consistent with baseline. will initiate IV antibiotics, IV fluids, stool studies, add on serum magnesium level.  As needed IV fentanyl.  Blood cultures x2 collected.    [name] Bertin, DO Hospitalist

## 2021-03-26 NOTE — ED Provider Notes (Signed)
[facility] EMERGENCY DEPARTMENT Provider Note   CSN: [id number] Arrival date & time: 03/26/21  1355     History No chief complaint on file.   [name] is a 60 y.o. female.  Presents to ER with concern for abdominal pain and bloody diarrhea.  Patient reports that she struggles from chronic abdominal pain, pain is similar to prior but seems to be slightly worse than normal.  Her home Oxy 15's have not been providing adequate pain control.  Pain is generalized, cramping, no alleviating or aggravating factors.  Bloody diarrhea started over the weekend.  Multiple episodes on Saturday and Sunday of bright red stool.  Then noted more brown stool yesterday.  No blood in stool yesterday or today.  Still having abdominal pain.  Some nausea but no vomiting.  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[place]) 05/01/2019   Cirrhosis of liver: per CT 07/11/2014   Constipation by delayed colonic transit 12/09/2014   COPD (chronic obstructive pulmonary disease) ([place])    COPD exacerbation ([place]) 10/12/2016   Dehiscence of surgical wound left knee 03/16/2013   Depression    Depression with anxiety 01/11/2019   Deviated septum    Diabetes mellitus without complication ([place])    type 2   Generalized abdominal pain 12/09/2014   GERD (gastroesophageal reflux disease)    Headache(784.0)    migraines   Hepatic cirrhosis ([place]) 06/2014   HLD (hyperlipidemia) 01/11/2019   Hypercholesterolemia     Hypertension    Hypertension associated with diabetes ([place])    Ileus ([place]) 07/11/2014   Influenza A 10/2018   Lobar pneumonia ([place])    Microcytic anemia 01/11/2019   Migraine headache 12/09/2014   Morbid obesity ([place]) 03/09/2013   Obesity    OSA (obstructive sleep apnea) 01/11/2019   Pars defect with spondylolisthesis 05/01/2019   Perforated bowel ([place])    PONV (postoperative nausea and vomiting)    Respiratory failure, acute-on-chronic ([place]) 12/11/2010   S/P left PF UKR 03/07/2013   Seizures ([place])    as a little girl 60 years old   Shortness of breath    Sleep apnea    dont wear bipap . lost weight   Spondylolisthesis, grade 2    Tobacco abuse    Type 2 diabetes mellitus with obesity ([place]) 01/11/2019    Patient Active Problem List   Diagnosis Date Noted   Hyponatremia 02/28/2020   Peripheral edema 02/28/2020   Pain in left knee 09/12/2019   Cirrhosis of liver with ascites ([place]) 05/01/2019   Ascites 05/01/2019   NAFLD (nonalcoholic fatty liver disease) 05/01/2019   Chronic prescription benzodiazepine use 05/01/2019   Chronic, continuous use of opioids 05/01/2019   Abdominal pain 01/12/2019   HLD (hyperlipidemia) 01/11/2019   Type 2 diabetes mellitus with obesity ([place]) 01/11/2019   Depression with anxiety 01/11/2019   OSA (obstructive sleep apnea) 01/11/2019   Hypomagnesemia  Hypophosphatemia    Degeneration of lumbar intervertebral disc 08/26/2018   Lumbar spondylosis 07/29/2018   Lumbar radiculopathy 10/03/2017   Chronic low back pain with left-sided sciatica 10/15/2015   Migraine headache 12/09/2014   Constipation by delayed colonic transit 12/09/2014   History of prosthetic unicompartmental arthroplasty of left knee 03/24/2013   Morbid obesity ([place]) 03/09/2013   Chronic pain    Tobacco abuse    CAD (coronary artery disease)    Hypertension associated with diabetes ([place])    COPD (chronic obstructive pulmonary disease) ([place])     Past Surgical History:  Procedure  Laterality Date   ABDOMINAL EXPLORATION SURGERY     primary repair of colon perforation from [facility]   ABDOMINAL HYSTERECTOMY     total   BACK SURGERY     lumbar   COLONOSCOPY WITH PROPOFOL N/A 10/04/2019   Procedure: COLONOSCOPY WITH PROPOFOL;  Surgeon: Wilford Corner, MD;  Location: [facility] END[facility];  Service: Endoscopy;  Laterality: N/A;   ESOPHAGOGASTRODUODENOSCOPY (EGD) WITH PROPOFOL N/A 10/29/2016   Procedure: ESOPHAGOGASTRODUODENOSCOPY (EGD) WITH PROPOFOL;  Surgeon: Laurence Spates, MD;  Location: [facility];  Service: Endoscopy;  Laterality: N/A;   ESOPHAGOGASTRODUODENOSCOPY (EGD) WITH PROPOFOL N/A 10/04/2019   Procedure: ESOPHAGOGASTRODUODENOSCOPY (EGD) WITH PROPOFOL;  Surgeon: Wilford Corner, MD;  Location: [facility];  Service: Endoscopy;  Laterality: N/A;   flank ablation     IR PARACENTESIS  01/12/2019   IR PARACENTESIS  05/02/2019   IR PARACENTESIS  08/03/2019   IRRIGATION AND DEBRIDEMENT KNEE Left 03/14/2013   Procedure: IRRIGATION AND DEBRIDEMENT  and Closure of wound left KNEE;  Surgeon: Mauri Pole, MD;  Location: [facility] ORS;  Service: Orthopedics;  Laterality: Left;   PATELLA-FEMORAL ARTHROPLASTY Left 03/07/2013   Procedure: LEFT PATELLA-FEMORAL ARTHROPLASTY;  Surgeon: Mauri Pole, MD;  Location: [facility];  Service: Orthopedics;  Laterality: Left;   SAVORY DILATION N/A 10/29/2016   Procedure: SAVORY DILATION;  Surgeon: Laurence Spates, MD;  Location: [facility] END[facility];  Service: Endoscopy;  Laterality: N/A;     OB History   No obstetric history on file.     Family History  Problem Relation Age of Onset   Stroke Mother    Cancer - Lung Father    Hypertension Brother    Heart attack Sister    Alcohol abuse Sister    Breast cancer Maternal Grandmother     Social History   Tobacco Use   Smoking status: Some Days    Packs/day: 1.50    Pack years: 0.00    Types: Cigarettes   Smokeless tobacco: Never   Tobacco comments:    quit 14 days and Daughter had drug relapse and  pt. smoked  Substance Use Topics   Alcohol use: No   Drug use: No    Home Medications Prior to Admission medications   Medication Sig Start Date End Date Taking? Authorizing Provider  albuterol (PROVENTIL HFA;VENTOLIN HFA) 108 (90 BASE) MCG/ACT inhaler Inhale 2 puffs into the lungs every 4 (four) hours as needed for wheezing or shortness of breath.     [provider]  albuterol (PROVENTIL) (2.5 MG/3ML) 0.083% nebulizer solution Take 2.5 mg by nebulization every 6 (six) hours as needed for wheezing or shortness of breath.    [provider]  ALPRAZolam Duanne Moron) 1 MG tablet Take 1-2 mg by mouth See admin instructions. Take 1 tablet (1 mg) by mouth in the morning as needed for anxiety & take 2 tablets (2 mg) by mouth scheduled at bedtime.  [provider]  amLODipine (NORVASC) 5 MG tablet TAKE 1 TABLET BY MOUTH IN THE MORNING AND 1 TABLET AT BEDTIME 03/12/21   Bhagat, Bhavinkumar, PA  benazepril (LOTENSIN) 40 MG tablet Take 40 mg by mouth daily.     [provider]  cyclobenzaprine (FLEXERIL) 10 MG tablet Take 10 mg by mouth 3 (three) times daily. 09/16/19   [provider]  docusate sodium (COLACE) 250 MG capsule Take 250 mg by mouth 2 (two) times daily.    [provider]  escitalopram (LEXAPRO) 20 MG tablet Take 20 mg by mouth every evening.  04/06/17   [provider]  estradiol (ESTRACE) 2 MG tablet Take 2 mg by mouth daily.     [provider]  fluticasone (FLONASE) 50 MCG/ACT nasal spray Place 1 spray into both nostrils at bedtime.     [provider]  levocetirizine (XYZAL) 5 MG tablet Take 5 mg by mouth every evening.  09/16/19   [provider]  meloxicam (MOBIC) 15 MG tablet Take 15 mg by mouth daily as needed for pain.  09/16/19   [provider]  metFORMIN (GLUCOPHAGE) 500 MG tablet Take 2 tablets (1,000 mg total) by mouth 2 (two) times daily with a meal. Patient taking differently: Take 500  mg by mouth daily with breakfast.  12/13/14   Thurnell Lose, MD  naloxone [facility]) 4 MG/0.1ML LIQD nasal spray kit Place 1 spray into the nose as needed (for overdose).     [provider]  nystatin (MYCOSTATIN/NYSTOP) powder Apply 1 g topically daily as needed (irritation. (Typically during Summer months)).     [provider]  ondansetron (ZOFRAN) 4 MG tablet Take 1 tablet (4 mg total) by mouth every 8 (eight) hours as needed for nausea or vomiting. 03/15/20   Isla Pence, MD  ondansetron (ZOFRAN) 8 MG tablet Take 8 mg by mouth every 8 (eight) hours as needed for nausea or vomiting.    [provider]  oxyCODONE (ROXICODONE) 15 MG immediate release tablet Take 15 mg by mouth every 4 (four) hours.  02/16/20   [provider]  oxyCODONE (ROXICODONE) 15 MG immediate release tablet Take 1 tablet (15 mg total) by mouth every 4 (four) hours as needed for pain. 03/15/20   Isla Pence, MD  pantoprazole (PROTONIX) 40 MG tablet Take 40 mg by mouth 2 (two) times daily.     [provider]  PHAZYME MAXIMUM STRENGTH 250 MG CAPS Take 250 mg by mouth 2 (two) times daily as needed (gas relief).    [provider]  polyethylene glycol (MIRALAX / GLYCOLAX) packet Take 17 g by mouth 2 (two) times daily. Patient taking differently: Take 17 g by mouth daily as needed for moderate constipation.  10/22/18   Florencia Reasons, MD  prochlorperazine (COMPAZINE) 10 MG tablet Take 10 mg by mouth every 6 (six) hours as needed for nausea or vomiting.     [provider]  psyllium (METAMUCIL) 58.6 % packet Take 1 packet by mouth daily as needed (regularity).     [provider]  simvastatin (ZOCOR) 20 MG tablet Take 20 mg by mouth at bedtime.    [provider]  SYMBICORT 160-4.5 MCG/ACT inhaler Inhale 2 puffs into the lungs in the morning and at bedtime.  02/09/20   [provider]  torsemide (DEMADEX) 20 MG tablet Take 40 mg by mouth daily as  needed (fluid).  12/30/18   [provider]    Allergies  Spironolactone, Gabapentin, Sulfamethoxazole-trimethoprim, Amoxicillin, Clindamycin/lincomycin, Doxycycline, Treximet [sumatriptan-naproxen sodium], and Bactrim  Review of Systems   Review of Systems  Constitutional:  Negative for chills and fever.  HENT:  Negative for ear pain and sore throat.   Eyes:  Negative for pain and visual disturbance.  Respiratory:  Negative for cough and shortness of breath.   Cardiovascular:  Negative for chest pain and palpitations.  Gastrointestinal:  Positive for abdominal pain, blood in stool and nausea. Negative for vomiting.  Genitourinary:  Negative for dysuria and hematuria.  Musculoskeletal:  Negative for arthralgias and back pain.  Skin:  Negative for color change and rash.  Neurological:  Negative for seizures and syncope.  All other systems reviewed and are negative.  Physical Exam Updated Vital Signs BP (!) 101/42   Pulse 74   Temp 98.4 F (36.9 C) (Oral)   Resp 18   SpO2 96%   Physical Exam Vitals and nursing note reviewed.  Constitutional:      General: She is not in acute distress.    Appearance: She is well-developed.  HENT:     Head: Normocephalic and atraumatic.  Eyes:     Conjunctiva/sclera: Conjunctivae normal.  Cardiovascular:     Rate and Rhythm: Normal rate and regular rhythm.     Heart sounds: No murmur heard. Pulmonary:     Effort: Pulmonary effort is normal. No respiratory distress.     Breath sounds: Normal breath sounds.  Abdominal:     Palpations: Abdomen is soft.     Tenderness: There is no abdominal tenderness.  Genitourinary:    Comments: Crystal chaperone  Normal-appearing rectum, no external hemorrhoids or fissures seen, on digital rectal exam, no internal hemorrhoids palpated, stool brown Musculoskeletal:        General: No deformity or signs of injury.     Cervical back: Neck supple.  Skin:    General: Skin is warm and dry.   Neurological:     General: No focal deficit present.     Mental Status: She is alert.  Psychiatric:        Mood and Affect: Mood normal.    ED Results / Procedures / Treatments   Labs (all labs ordered are listed, but only abnormal results are displayed) Labs Reviewed  COMPREHENSIVE METABOLIC PANEL - Abnormal; Notable for the following components:      Result Value   Sodium 128 (*)    Chloride 94 (*)    Glucose, Bld 121 (*)    Creatinine, Ser 1.12 (*)    Calcium 7.8 (*)    Albumin 2.9 (*)    GFR, Estimated 56 (*)    All other components within normal limits  CBC - Abnormal; Notable for the following components:   WBC 12.6 (*)    MCH 25.9 (*)    RDW 15.9 (*)    All other components within normal limits  PROTIME-INR - Abnormal; Notable for the following components:   Prothrombin Time 15.7 (*)    INR 1.3 (*)    All other components within normal limits  RESP PANEL BY RT-PCR (FLU A&B, COVID) ARPGX2  AMMONIA  LACTIC ACID, PLASMA  LACTIC ACID, PLASMA  POC OCCULT BLOOD, ED  POC OCCULT BLOOD, ED  TYPE AND SCREEN    EKG None  Radiology CT Angio Abd/Pel W and/or Wo Contrast  Result Date: 03/26/2021 CLINICAL DATA:  GI bleeding with bloody diarrhea for several days and abdominal pain EXAM: CTA ABDOMEN AND PELVIS WITHOUT AND WITH CONTRAST TECHNIQUE:  Multidetector CT imaging of the abdomen and pelvis was performed using the standard protocol during bolus administration of intravenous contrast. Multiplanar reconstructed images and MIPs were obtained and reviewed to evaluate the vascular anatomy. CONTRAST:  162m OMNIPAQUE IOHEXOL 350 MG/ML SOLN COMPARISON:  02/08/2021 FINDINGS: VASCULAR Aorta: Atherosclerotic calcifications are noted without aneurysmal dilatation. Celiac: Celiac axis is within normal limits. Separate origin of the right inferior phrenic artery is noted directly from the aorta adjacent to the main celiac axis. SMA: Patent without evidence of aneurysm, dissection,  vasculitis or significant stenosis. Replaced right hepatic artery is noted as well. Renals: Dual renal arteries are noted on the right with single renal artery on the left. Mild atherosclerotic changes are seen without focal stenosis. IMA: Patent without evidence of aneurysm, dissection, vasculitis or significant stenosis. Inflow: Iliacs are within normal limits with mild atherosclerotic calcification. No focal stenosis is noted. Veins: No specific venous abnormality is noted. Review of the MIP images confirms the above findings. NON-VASCULAR Lower chest: No acute abnormality. Hepatobiliary: Diffuse fatty infiltration of the liver is noted. The gallbladder is well distended without cholelithiasis. Mild perihepatic ascites is seen. Pancreas: Unremarkable. No pancreatic ductal dilatation or surrounding inflammatory changes. Spleen: Stable cyst is noted within the spleen. Mild perisplenic ascites is noted as well. Adrenals/Urinary Tract: Adrenal glands are within normal limits. Kidneys demonstrate a normal enhancement pattern bilaterally. Scattered small cysts are noted. No renal calculi or obstructive changes are seen. The bladder is well distended. Stomach/Bowel: Scattered diverticular change of the colon is noted. Mild wall thickening is noted within the descending colon which may represent some focal colitis. The more proximal colon is within normal limits. No pooling of contrast material is noted within the colon to suggest acute hemorrhage. Small bowel and stomach are within normal limits. Lymphatic: Scattered small lymph nodes are noted adjacent to the gastric fundus as well as in the gastrohepatic ligament. Small periaortic nodes are seen but not significant by size criteria. These are likely reactive in nature. Reproductive: Status post hysterectomy. No adnexal masses. Other: Mild ascites is noted primarily surrounding the liver and spleen. Musculoskeletal: No acute or significant osseous findings. IMPRESSION:  VASCULAR Scattered atherosclerotic changes are noted. Variant anatomy is seen. No findings to suggest active arterial GI bleeding. NON-VASCULAR Changes suggestive of colitis in the descending colon. No abscess or perforation is noted. It is uncertain whether this represents infectious or ischemic colitis. Diverticular changes noted without definitive diverticulitis. Fatty liver. Splenic and renal cysts. Scattered small lymph nodes are seen but not significant by size criteria. These are likely reactive in nature. Mild ascites. Electronically Signed   By: MInez CatalinaM.D.   On: 03/26/2021 18:30    Procedures Procedures   Medications Ordered in ED Medications  piperacillin-tazobactam (ZOSYN) IVPB 3.375 g (has no administration in time range)  HYDROmorphone (DILAUDID) injection 1 mg (has no administration in time range)  sodium chloride 0.9 % bolus 1,000 mL (has no administration in time range)  sodium chloride 0.9 % bolus 1,000 mL (0 mLs Intravenous Stopped 03/26/21 1907)  HYDROmorphone (DILAUDID) injection 1 mg (1 mg Intravenous Given 03/26/21 1600)  ondansetron (ZOFRAN) injection 4 mg (4 mg Intravenous Given 03/26/21 1559)  iohexol (OMNIPAQUE) 350 MG/ML injection 100 mL (100 mLs Intravenous Contrast Given 03/26/21 1722)  sodium chloride 0.9 % bolus 1,000 mL (1,000 mLs Intravenous New Bag/Given 03/26/21 1906)  fentaNYL (SUBLIMAZE) injection 50 mcg (50 mcg Intravenous Given 03/26/21 1915)    ED Course  I have reviewed the triage  vital signs and the nursing notes.  Pertinent labs & imaging results that were available during my care of the patient were reviewed by me and considered in my medical decision making (see chart for details).    MDM Rules/Calculators/A&P                          60 year old lady presents to ER with concern for abdominal pain and bloody diarrhea.  On exam, patient was initially noted to have soft blood pressure, when I assessed patient she looked well and not in distress.   Did have some generalized tenderness to palpation but no rebound or guarding.  Stool was brown and Hemoccult negative.  Hemoglobin normal.  CT negative for any active bleeding.  Did demonstrate findings concerning for colitis, radiologist said could be infectious or ischemic.  Radiologist did report normal abdominal vasculature.  Sent lactic acid which was normal.  Therefore have lower suspicion for ischemic source.  Suspect more likely infectious.  Will start on antibiotics.  She did have improvement in blood pressure but then had second drop in BP that did respond to fluids.  Given her ongoing pain, transient hypotensive episodes, believe she would benefit from admission for further observation, IV fluids, IV antibiotics.  Consult [facility] for admit.  Final Clinical Impression(s) / ED Diagnoses Final diagnoses:  Colitis  Leukocytosis, unspecified type  Hyponatremia    Rx / DC Orders ED Discharge Orders     None        Lucrezia Starch, MD 03/26/21 2020

## 2021-03-26 NOTE — H&P (Signed)
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 monitoring of ensuing blood pressure via routine vital signs.  Check EKG.  Repeat CMP and CBC in the morning.  Check INR.  Hold home antihypertensive medications for now.       #) Acute kidney injury: Relative to most recent prior serum creatinine data point of 0.80 on 02/17/2021, presenting serum creatinine found to be 1.12.  Suspect that this is prerenal in nature on the basis of intravascular depletion as result of dehydration stemming from recent loose stools as well as decline in oral intake over that timeframe.  Potential pharmacologic exacerbating factors stemming from home benazepril.  There may also be an additional prerenal contribution from diminished renal perfusion as a consequence of presenting hypotension, as further detailed above.  Plan: Continuous IV fluids overnight, as above.  Monitor strict I's and O's and daily weights.   Attempt to avoid nephrotoxic agents.  Hold home benazepril.  Check urinalysis with microscopy.  Add on random urine sodium as well as random urine creatinine.  Repeat BMP in the morning.  Add on serum magnesium level.        #) NASH cirrhosis: The patient reports a history of cirrhosis on the basis of NASH, and denies any history of regular or recent alcohol consumption.  Her presenting meld score is calculated to be 10, associated with a estimated 49-monthmortality is 6.0%.  Of note, she is not on any Lasix or spironolactone as an outpatient.  Plan: Monitor strict I's and O's and daily weights.  Repeat CMP in the morning as well as INR at that time.      #) Type 2 diabetes mellitus: On metformin as an outpatient.  Not on any insulin at home.    Plan: Hold home metformin during this hospitalization.  Before every meal and at bedtime Accu-Cheks with sliding scale insulin have been ordered.      #) Hyperlipidemia: On simvastatin as an outpatient.  Plan: Continue home statin.      #) Chronic hypoosmolar hyponatremia: Documented history of such, with baseline serum sodium range of 1 27-1 31 dating back to 2020.  Presenting serum sodium of 128 found to be consistent with this chronic range.  Suspect an element of chronic hypervolemia in the setting of documented history of Nash cirrhosis.  Plan: Monitor strict I's and O's and daily weights.  Repeat BMP in the morning.     DVT prophylaxis: SCDs Code Status: Full code Family Communication: none Disposition Plan: Per Rounding Team Consults called: none  Admission status: Inpatient;      Of note, this patient was added by me to the following Admit List/Treatment Team: [email or web address].      PLEASE NOTE THAT DRAGON DICTATION SOFTWARE WAS USED IN THE CONSTRUCTION OF THIS NOTE.   [facility] Pager [phone] [place] Otherwise, please contact night-coverage  [email or web address] Password  [facility]  03/26/2021, 8:51 PM

## 2021-03-26 NOTE — ED Triage Notes (Signed)
Patient arrived by [facility] from home with complaint of bloody diarrhea for 3 days. Patient has had 700NS for BP 90s, 90/40. Patient has cirrhosis.

## 2021-03-27 DIAGNOSIS — I959 Hypotension, unspecified: Secondary | ICD-10-CM | POA: Diagnosis present

## 2021-03-27 DIAGNOSIS — N179 Acute kidney failure, unspecified: Secondary | ICD-10-CM | POA: Diagnosis present

## 2021-03-27 DIAGNOSIS — J449 Chronic obstructive pulmonary disease, unspecified: Secondary | ICD-10-CM

## 2021-03-27 DIAGNOSIS — A419 Sepsis, unspecified organism: Secondary | ICD-10-CM | POA: Diagnosis present

## 2021-03-27 DIAGNOSIS — R112 Nausea with vomiting, unspecified: Secondary | ICD-10-CM | POA: Diagnosis present

## 2021-03-27 LAB — MAGNESIUM: Magnesium: 1.4 mg/dL — ABNORMAL LOW (ref 1.7–2.4)

## 2021-03-27 LAB — COMPREHENSIVE METABOLIC PANEL
ALT: 16 U/L (ref 0–44)
AST: 22 U/L (ref 15–41)
Albumin: 2.7 g/dL — ABNORMAL LOW (ref 3.5–5.0)
Alkaline Phosphatase: 65 U/L (ref 38–126)
Anion gap: 7 (ref 5–15)
BUN: 12 mg/dL (ref 8–23)
CO2: 25 mmol/L (ref 22–32)
Calcium: 7.6 mg/dL — ABNORMAL LOW (ref 8.9–10.3)
Chloride: 101 mmol/L (ref 98–111)
Creatinine, Ser: 1.03 mg/dL — ABNORMAL HIGH (ref 0.44–1.00)
GFR, Estimated: >60 mL/min (ref >60)
Glucose, Bld: 138 mg/dL — ABNORMAL HIGH (ref 70–99)
Potassium: 3.9 mmol/L (ref 3.5–5.1)
Sodium: 133 mmol/L — ABNORMAL LOW (ref 135–145)
Total Bilirubin: 0.6 mg/dL (ref 0.3–1.2)
Total Protein: 6.7 g/dL (ref 6.5–8.1)

## 2021-03-27 LAB — CBC WITH DIFFERENTIAL/PLATELET
Abs Immature Granulocytes: 0.03 10*3/uL (ref 0.00–0.07)
Basophils Absolute: 0 10*3/uL (ref 0.0–0.1)
Basophils Relative: 0 %
Eosinophils Absolute: 0.2 10*3/uL (ref 0.0–0.5)
Eosinophils Relative: 2 %
HCT: 35.1 % — ABNORMAL LOW (ref 36.0–46.0)
Hemoglobin: 10.7 g/dL — ABNORMAL LOW (ref 12.0–15.0)
Immature Granulocytes: 0 %
Lymphocytes Relative: 23 %
Lymphs Abs: 2.6 10*3/uL (ref 0.7–4.0)
MCH: 26 pg (ref 26.0–34.0)
MCHC: 30.5 g/dL (ref 30.0–36.0)
MCV: 85.4 fL (ref 80.0–100.0)
Monocytes Absolute: 0.6 10*3/uL (ref 0.1–1.0)
Monocytes Relative: 6 %
Neutro Abs: 7.5 10*3/uL (ref 1.7–7.7)
Neutrophils Relative %: 69 %
Platelets: 148 10*3/uL — ABNORMAL LOW (ref 150–400)
RBC: 4.11 MIL/uL (ref 3.87–5.11)
RDW: 15.9 % — ABNORMAL HIGH (ref 11.5–15.5)
WBC: 11 10*3/uL — ABNORMAL HIGH (ref 4.0–10.5)
nRBC: 0 % (ref 0.0–0.2)

## 2021-03-27 LAB — GLUCOSE, CAPILLARY
Glucose-Capillary: 115 mg/dL — ABNORMAL HIGH (ref 70–99)
Glucose-Capillary: 228 mg/dL — ABNORMAL HIGH (ref 70–99)
Glucose-Capillary: 131 mg/dL — ABNORMAL HIGH (ref 70–99)
Glucose-Capillary: 121 mg/dL — ABNORMAL HIGH (ref 70–99)

## 2021-03-27 LAB — PROTIME-INR
INR: 1.2 (ref 0.8–1.2)
Prothrombin Time: 15 seconds (ref 11.4–15.2)

## 2021-03-27 LAB — MRSA NEXT GEN BY PCR, NASAL: MRSA by PCR Next Gen: NOT DETECTED

## 2021-03-27 LAB — LACTATE DEHYDROGENASE
LDH: 141 U/L (ref 98–192)
LDH: 146 U/L (ref 98–192)

## 2021-03-27 LAB — HIV ANTIBODY (ROUTINE TESTING W REFLEX): HIV Screen 4th Generation wRfx: NONREACTIVE

## 2021-03-27 MED ORDER — NICOTINE 21 MG/24HR TD PT24
21.0000 mg | MEDICATED_PATCH | Freq: Every day | TRANSDERMAL | Status: DC
  Administered 2021-03-27 – 2021-03-29 (×3): 21 mg via TRANSDERMAL
  Filled 2021-03-27 (×3): qty 1

## 2021-03-27 MED ORDER — POLYVINYL ALCOHOL 1.4 % OP SOLN
1.0000 [drp] | OPHTHALMIC | Status: DC | PRN
  Filled 2021-03-27: qty 15

## 2021-03-27 NOTE — Progress Notes (Signed)
Pharmacy Antibiotic Note  [name] is a 60 y.o. female admitted on 03/26/2021 with sepsis.  Pharmacy has been consulted for Zosyn dosing.  Plan: Zosyn 3.375g IV q8h (4 hour infusion). Monitor clinical progress, cultures/sensitivities, renal function, abx plan   Height: 5' (152.4 cm) Weight: 103.6 kg (228 lb 6.3 oz) IBW/kg (Calculated) : 45.5  Temp (24hrs), Avg:98.2 F (36.8 C), Min:97.7 F (36.5 C), Max:98.7 F (37.1 C)  Recent Labs  Lab 03/26/21 1521 03/26/21 1857 03/26/21 2117 03/27/21 0222  WBC 12.6*  --   --  11.0*  CREATININE 1.12*  --   --  1.03*  LATICACIDVEN  --  1.1 1.3  --     Estimated Creatinine Clearance: 62.2 mL/min (A) (by C-G formula based on SCr of 1.03 mg/dL (H)).    Allergies  Allergen Reactions   Spironolactone Nausea Only    nausea   Gabapentin Other (See Comments)    sleepwalking   Sulfamethoxazole-Trimethoprim Hives   Amoxicillin Nausea And Vomiting    tolerated augmentin in the past (03/2013) without issue. Did it involve swelling of the face/tongue/throat, SOB, or low BP? No Did it involve sudden or severe rash/hives, skin peeling, or any reaction on the inside of your mouth or nose? No Did you need to seek medical attention at a hospital or doctor's office? No When did it last happen? unk       If all above answers are "NO", may proceed with cephalosporin use.    Clindamycin/Lincomycin Nausea And Vomiting   Doxycycline Nausea And Vomiting   Treximet [Sumatriptan-Naproxen Sodium] Nausea And Vomiting   Bactrim Hives and Rash    Antimicrobials this admission: 7/12 Zosyn >>    Dose adjustments this admission:   Microbiology results: 7/13 BCx 7/13 GIP 7/13 Cdiff  Thank you for allowing pharmacy to be a part of this patient's care.  Jens Som 03/27/2021 4:44 PM

## 2021-03-27 NOTE — Progress Notes (Signed)
PROGRESS NOTE  [name] OIB:[id number] DOB: [birth year] DOA: 03/26/2021 PCP: Shirline Frees, MD  HPI/Recap of past 35 hours: 60 year old female with past medical history of chronic abdominal pain on chronic opioids, Nash cirrhosis, morbid obesity, COPD, diabetes mellitus and hypertension admitted on 7/12 for worsening abdominal pain over the past few days along with increased episodes of loose stools in the ED found to have colitis.  Patient started on IV antibiotics and medication for pain and nausea and admitted to the hospitalist service.  This morning, patient not feeling as bad although psychics having some increased abdominal pain as day progressed.  White count mildly improved from previous day.  Assessment/Plan: Principal Problem: Sepsis causing colitis: Responded to fluids and antibiotics.  Noted mild drop in hemoglobin which may have been more due to initial hemoconcentration.  Continue to follow.  Advance diet slowly, currently on clear liquids.  Initially categorized as severe sepsis although this is incorrect and this is just simply sepsis with criteria met given leukocytosis, tachycardia colonic source Active Problems:   COPD (chronic obstructive pulmonary disease) ([place]): Breathing stable.    Type 2 diabetes mellitus with obesity [facility]): Monitor CBGs closely    Chronic abdominal pain: Currently on IV Dilaudid.  Will need to change to p.o. shortly.    Cirrhosis of liver with ascites ([place]): Stable during this hospitalization.    Nausea & vomiting: Improved   AKI (acute kidney injury) ([place]): Mild, improved with IV fluids   Hypotension: Some degree of chronic occurrence due to pain medication use.  Monitor blood pressure closely   Code Status: Full code  Family Communication: Left message with family  Disposition Plan: Anticipate discharge in the next 1 to 2 days as she is able to take p.o. with decreased  pain   Consultants:    Procedures: None  Antimicrobials: IV Zosyn  DVT prophylaxis: SCDs  Level of care: Progressive   Objective: Vitals:   03/27/21 0824 03/27/21 1250  BP: (!) 97/53 96/61  Pulse: 78 85  Resp: 18 15  Temp: 98.6 F (37 C) 98.7 F (37.1 C)  SpO2: 99% 99%    Intake/Output Summary (Last 24 hours) at 03/27/2021 1350 Last data filed at 03/27/2021 1200 Gross per 24 hour  Intake 2475.04 ml  Output 1600 ml  Net 875.04 ml   Filed Weights   03/27/21 0256  Weight: 103.6 kg   Body mass index is 44.61 kg/m.  Exam:  General: Alert and oriented x3, no acute distress HEENT: Normocephalic and atraumatic, mucous membranes slightly dry Cardiovascular: Regular rate and rhythm, S1-S2 Respiratory: Clear to auscultation bilaterally Abdomen: Soft, nontender, nondistended, positive bowel sounds Musculoskeletal: No clubbing or cyanosis, trace pitting edema Skin: No skin breaks, tears or lesions Psychiatry: Appropriate, no evidence of psychoses Neurology: No focal deficits   Data Reviewed: CBC: Recent Labs  Lab 03/26/21 1521 03/27/21 0222  WBC 12.6* 11.0*  NEUTROABS  --  7.5  HGB 12.2 10.7*  HCT 38.2 35.1*  MCV 81.1 85.4  PLT 171 945*   Basic Metabolic Panel: Recent Labs  Lab 03/26/21 1521 03/26/21 1548 03/27/21 0222  NA 128*  --  133*  K 4.1  --  3.9  CL 94*  --  101  CO2 29  --  25  GLUCOSE 121*  --  138*  BUN 15  --  12  CREATININE 1.12*  --  1.03*  CALCIUM 7.8*  --  7.6*  MG  --  1.2* 1.4*   GFR: Estimated  Creatinine Clearance: 62.2 mL/min (A) (by C-G formula based on SCr of 1.03 mg/dL (H)). Liver Function Tests: Recent Labs  Lab 03/26/21 1521 03/27/21 0222  AST 22 22  ALT 17 16  ALKPHOS 66 65  BILITOT 1.0 0.6  PROT 6.7 6.7  ALBUMIN 2.9* 2.7*   No results for input(s): LIPASE, AMYLASE in the last 168 hours. Recent Labs  Lab 03/26/21 1541  AMMONIA 33   Coagulation Profile: Recent Labs  Lab 03/26/21 1553 03/27/21 0222   INR 1.3* 1.2   Cardiac Enzymes: No results for input(s): CKTOTAL, CKMB, CKMBINDEX, TROPONINI in the last 168 hours. BNP (last 3 results) No results for input(s): PROBNP in the last 8760 hours. HbA1C: No results for input(s): HGBA1C in the last 72 hours. CBG: Recent Labs  Lab 03/27/21 0856 03/27/21 1254  GLUCAP 115* 228*   Lipid Profile: No results for input(s): CHOL, HDL, LDLCALC, TRIG, CHOLHDL, LDLDIRECT in the last 72 hours. Thyroid Function Tests: No results for input(s): TSH, T4TOTAL, FREET4, T3FREE, THYROIDAB in the last 72 hours. Anemia Panel: No results for input(s): VITAMINB12, FOLATE, FERRITIN, TIBC, IRON, RETICCTPCT in the last 72 hours. Urine analysis:    Component Value Date/Time   COLORURINE YELLOW 03/26/2021 2142   APPEARANCEUR CLEAR 03/26/2021 2142   LABSPEC 1.017 03/26/2021 2142   PHURINE 5.0 03/26/2021 2142   GLUCOSEU NEGATIVE 03/26/2021 2142   HGBUR NEGATIVE 03/26/2021 2142   BILIRUBINUR NEGATIVE 03/26/2021 2142   KETONESUR NEGATIVE 03/26/2021 2142   PROTEINUR NEGATIVE 03/26/2021 2142   UROBILINOGEN 0.2 12/09/2014 0840   NITRITE NEGATIVE 03/26/2021 2142   LEUKOCYTESUR NEGATIVE 03/26/2021 2142   Sepsis Labs: @LABRCNTIP (procalcitonin:4,lacticidven:4)  ) Recent Results (from the past 240 hour(s))  Resp Panel by RT-PCR (Flu A&B, Covid) Nasopharyngeal Swab     Status: None   Collection Time: 03/26/21  6:58 PM   Specimen: Nasopharyngeal Swab; Nasopharyngeal(NP) swabs in vial transport medium  Result Value Ref Range Status   SARS Coronavirus 2 by RT PCR NEGATIVE NEGATIVE Final    Comment: (NOTE) SARS-CoV-2 target nucleic acids are NOT DETECTED.  The SARS-CoV-2 RNA is generally detectable in upper respiratory specimens during the acute phase of infection. The lowest concentration of SARS-CoV-2 viral copies this assay can detect is 138 copies/mL. A negative result does not preclude SARS-Cov-2 infection and should not be used as the sole basis for  treatment or other patient management decisions. A negative result may occur with  improper specimen collection/handling, submission of specimen other than nasopharyngeal swab, presence of viral mutation(s) within the areas targeted by this assay, and inadequate number of viral copies(<138 copies/mL). A negative result must be combined with clinical observations, patient history, and epidemiological information. The expected result is Negative.  Fact Sheet for Patients:  EntrepreneurPulse.com.au  Fact Sheet for Healthcare Providers:  IncredibleEmployment.be  This test is no t yet approved or cleared by the [place] FDA and  has been authorized for detection and/or diagnosis of SARS-CoV-2 by FDA under an Emergency Use Authorization (EUA). This EUA will remain  in effect (meaning this test can be used) for the duration of the COVID-19 declaration under Section 564(b)(1) of the Act, 21 U.S.C.section 360bbb-3(b)(1), unless the authorization is terminated  or revoked sooner.       Influenza A by PCR NEGATIVE NEGATIVE Final   Influenza B by PCR NEGATIVE NEGATIVE Final    Comment: (NOTE) The Xpert Xpress SARS-CoV-2/FLU/RSV plus assay is intended as an aid in the diagnosis of influenza from Nasopharyngeal swab specimens and  should not be used as a sole basis for treatment. Nasal washings and aspirates are unacceptable for Xpert Xpress SARS-CoV-2/FLU/RSV testing.  Fact Sheet for Patients: EntrepreneurPulse.com.au  Fact Sheet for Healthcare Providers: IncredibleEmployment.be  This test is not yet approved or cleared by the [place] FDA and has been authorized for detection and/or diagnosis of SARS-CoV-2 by FDA under an Emergency Use Authorization (EUA). This EUA will remain in effect (meaning this test can be used) for the duration of the COVID-19 declaration under Section 564(b)(1) of the Act, 21  U.S.C. section 360bbb-3(b)(1), unless the authorization is terminated or revoked.  Performed at [facility], [place] [address] [place], [name] [address]   MRSA Next Gen by PCR, Nasal     Status: None   Collection Time: 03/27/21  3:38 AM   Specimen: Nasal Mucosa; Nasal Swab  Result Value Ref Range Status   MRSA by PCR Next Gen NOT DETECTED NOT DETECTED Final    Comment: (NOTE) The GeneXpert MRSA Assay (FDA approved for NASAL specimens only), is one component of a comprehensive MRSA colonization surveillance program. It is not intended to diagnose MRSA infection nor to guide or monitor treatment for MRSA infections. Test performance is not FDA approved in patients less than 11 years old. Performed at [facility], [place] [address], [place], [place] [address]       Studies: CT Angio Abd/Pel W and/or Wo Contrast  Result Date: 03/26/2021 CLINICAL DATA:  GI bleeding with bloody diarrhea for several days and abdominal pain EXAM: CTA ABDOMEN AND PELVIS WITHOUT AND WITH CONTRAST TECHNIQUE: Multidetector CT imaging of the abdomen and pelvis was performed using the standard protocol during bolus administration of intravenous contrast. Multiplanar reconstructed images and MIPs were obtained and reviewed to evaluate the vascular anatomy. CONTRAST:  146m OMNIPAQUE IOHEXOL 350 MG/ML SOLN COMPARISON:  02/08/2021 FINDINGS: VASCULAR Aorta: Atherosclerotic calcifications are noted without aneurysmal dilatation. Celiac: Celiac axis is within normal limits. Separate origin of the right inferior phrenic artery is noted directly from the aorta adjacent to the main celiac axis. SMA: Patent without evidence of aneurysm, dissection, vasculitis or significant stenosis. Replaced right hepatic artery is noted as well. Renals: Dual renal arteries are noted on the right with single renal artery on the left. Mild atherosclerotic changes are seen without focal stenosis. IMA: Patent without evidence of aneurysm,  dissection, vasculitis or significant stenosis. Inflow: Iliacs are within normal limits with mild atherosclerotic calcification. No focal stenosis is noted. Veins: No specific venous abnormality is noted. Review of the MIP images confirms the above findings. NON-VASCULAR Lower chest: No acute abnormality. Hepatobiliary: Diffuse fatty infiltration of the liver is noted. The gallbladder is well distended without cholelithiasis. Mild perihepatic ascites is seen. Pancreas: Unremarkable. No pancreatic ductal dilatation or surrounding inflammatory changes. Spleen: Stable cyst is noted within the spleen. Mild perisplenic ascites is noted as well. Adrenals/Urinary Tract: Adrenal glands are within normal limits. Kidneys demonstrate a normal enhancement pattern bilaterally. Scattered small cysts are noted. No renal calculi or obstructive changes are seen. The bladder is well distended. Stomach/Bowel: Scattered diverticular change of the colon is noted. Mild wall thickening is noted within the descending colon which may represent some focal colitis. The more proximal colon is within normal limits. No pooling of contrast material is noted within the colon to suggest acute hemorrhage. Small bowel and stomach are within normal limits. Lymphatic: Scattered small lymph nodes are noted adjacent to the gastric fundus as well as in the gastrohepatic ligament.  Small periaortic nodes are seen but not significant by size criteria. These are likely reactive in nature. Reproductive: Status post hysterectomy. No adnexal masses. Other: Mild ascites is noted primarily surrounding the liver and spleen. Musculoskeletal: No acute or significant osseous findings. IMPRESSION: VASCULAR Scattered atherosclerotic changes are noted. Variant anatomy is seen. No findings to suggest active arterial GI bleeding. NON-VASCULAR Changes suggestive of colitis in the descending colon. No abscess or perforation is noted. It is uncertain whether this represents  infectious or ischemic colitis. Diverticular changes noted without definitive diverticulitis. Fatty liver. Splenic and renal cysts. Scattered small lymph nodes are seen but not significant by size criteria. These are likely reactive in nature. Mild ascites. Electronically Signed   By: Inez Catalina M.D.   On: 03/26/2021 18:30    Scheduled Meds:  escitalopram  20 mg Oral QPM   insulin aspart  0-6 Units Subcutaneous TID WC   loratadine  10 mg Oral Daily   mometasone-formoterol  2 puff Inhalation BID   pantoprazole (PROTONIX) IV  40 mg Intravenous Q12H   simvastatin  20 mg Oral QHS    Continuous Infusions:  lactated ringers 125 mL/hr at 03/27/21 1108   piperacillin-tazobactam (ZOSYN)  IV 3.375 g (03/27/21 0527)     LOS: 1 day     [name], MD [facility]   03/27/2021, 1:50 PM

## 2021-03-28 DIAGNOSIS — R109 Unspecified abdominal pain: Secondary | ICD-10-CM

## 2021-03-28 DIAGNOSIS — G8929 Other chronic pain: Secondary | ICD-10-CM

## 2021-03-28 LAB — BASIC METABOLIC PANEL
Anion gap: 4 — ABNORMAL LOW (ref 5–15)
BUN: <5 mg/dL — ABNORMAL LOW (ref 8–23)
CO2: 30 mmol/L (ref 22–32)
Calcium: 8 mg/dL — ABNORMAL LOW (ref 8.9–10.3)
Chloride: 101 mmol/L (ref 98–111)
Creatinine, Ser: 0.74 mg/dL (ref 0.44–1.00)
GFR, Estimated: >60 mL/min (ref >60)
Glucose, Bld: 146 mg/dL — ABNORMAL HIGH (ref 70–99)
Potassium: 4.1 mmol/L (ref 3.5–5.1)
Sodium: 135 mmol/L (ref 135–145)

## 2021-03-28 LAB — CBC
HCT: 33.6 % — ABNORMAL LOW (ref 36.0–46.0)
Hemoglobin: 10.6 g/dL — ABNORMAL LOW (ref 12.0–15.0)
MCH: 26.1 pg (ref 26.0–34.0)
MCHC: 31.5 g/dL (ref 30.0–36.0)
MCV: 82.8 fL (ref 80.0–100.0)
Platelets: 146 10*3/uL — ABNORMAL LOW (ref 150–400)
RBC: 4.06 MIL/uL (ref 3.87–5.11)
RDW: 15.9 % — ABNORMAL HIGH (ref 11.5–15.5)
WBC: 8.1 10*3/uL (ref 4.0–10.5)
nRBC: 0 % (ref 0.0–0.2)

## 2021-03-28 LAB — GLUCOSE, CAPILLARY
Glucose-Capillary: 250 mg/dL — ABNORMAL HIGH (ref 70–99)
Glucose-Capillary: 94 mg/dL (ref 70–99)
Glucose-Capillary: 171 mg/dL — ABNORMAL HIGH (ref 70–99)
Glucose-Capillary: 157 mg/dL — ABNORMAL HIGH (ref 70–99)

## 2021-03-28 MED ORDER — CEFDINIR 300 MG PO CAPS
300.0000 mg | ORAL_CAPSULE | Freq: Two times a day (BID) | ORAL | Status: DC
  Administered 2021-03-28 – 2021-03-29 (×3): 300 mg via ORAL
  Filled 2021-03-28 (×3): qty 1

## 2021-03-28 MED ORDER — ESTRADIOL 1 MG PO TABS
2.0000 mg | ORAL_TABLET | Freq: Every day | ORAL | Status: DC
  Administered 2021-03-28 – 2021-03-29 (×2): 2 mg via ORAL
  Filled 2021-03-28 (×2): qty 2
  Filled 2021-03-28: qty 1

## 2021-03-28 MED ORDER — METRONIDAZOLE 500 MG PO TABS
500.0000 mg | ORAL_TABLET | Freq: Two times a day (BID) | ORAL | Status: DC
  Administered 2021-03-28 – 2021-03-29 (×3): 500 mg via ORAL
  Filled 2021-03-28 (×3): qty 1

## 2021-03-28 MED ORDER — POLYETHYLENE GLYCOL 3350 17 G PO PACK: 17.0000 g | PACK | Freq: Every day | ORAL | Status: DC | PRN

## 2021-03-28 MED ORDER — PANTOPRAZOLE SODIUM 40 MG PO TBEC
40.0000 mg | DELAYED_RELEASE_TABLET | Freq: Two times a day (BID) | ORAL | Status: DC
  Administered 2021-03-28 – 2021-03-29 (×2): 40 mg via ORAL
  Filled 2021-03-28 (×2): qty 1

## 2021-03-28 MED ORDER — METRONIDAZOLE 500 MG PO TABS: 500.0000 mg | ORAL_TABLET | Freq: Two times a day (BID) | ORAL | 0 refills | Status: DC

## 2021-03-28 MED ORDER — HYDROMORPHONE HCL 1 MG/ML IJ SOLN
2.0000 mg | Freq: Once | INTRAMUSCULAR | Status: AC
  Administered 2021-03-28: 2 mg via INTRAVENOUS
  Filled 2021-03-28: qty 2

## 2021-03-28 MED ORDER — CEFDINIR 300 MG PO CAPS: 300.0000 mg | ORAL_CAPSULE | Freq: Two times a day (BID) | ORAL | 0 refills | Status: DC

## 2021-03-28 NOTE — Progress Notes (Signed)
Ok to change zosyn to cefdinir/flagyl for colitis to complete 5  days of total therapy per Dr. [place] Pink.   Onnie Boer, PharmD, BCIDP, AAHIVP, CPP Infectious Disease Pharmacist 03/28/2021 11:49 AM

## 2021-03-28 NOTE — Discharge Summary (Signed)
Discharge Summary  [name]:[id number] DOB: April 22, 1961  PCP: Shirline Frees, MD  Admit date: 03/26/2021 Anticipated discharge date: 03/28/2021  Time spent: 35 minutes  Recommendations for Outpatient Follow-up:  New medication: Flagyl 500 mg every 12 hours x4 days New medication: Cefdinir 500 mg every 12 hours x4 days  Discharge Diagnoses:  Active Hospital Problems   Diagnosis Date Noted   Sepsis, unspecified organism ([place]) 03/27/2021   Nausea & vomiting 03/27/2021   AKI (acute kidney injury) ([place]) 03/27/2021   Hypotension 03/27/2021   Colitis 03/26/2021   Cirrhosis of liver with ascites ([place]) 05/01/2019   Chronic abdominal pain 01/12/2019   Type 2 diabetes mellitus with obesity ([place]) 01/11/2019   Morbid obesity ([place]) 03/09/2013   COPD (chronic obstructive pulmonary disease) [facility])     Resolved Hospital Problems  No resolved problems to display.    Discharge Condition: Improved, being discharged home  Diet recommendation: Soft bland diet  Vitals:   03/28/21 0723 03/28/21 1220  BP: (!) 148/77 (!) 124/58  Pulse: 92 74  Resp: 16 14  Temp: 98.5 F (36.9 C) 98.7 F (37.1 C)  SpO2: 94% 100%    History of present illness:  60 year old female with past medical history of chronic abdominal pain on chronic opioids, [place] cirrhosis, morbid obesity, COPD, diabetes mellitus and hypertension admitted on 7/12 for worsening abdominal pain over the past few days along with increased episodes of loose stools in the ED found to have sepsis from colitis.  Patient started on IV antibiotics and medication for pain and nausea and admitted to the hospitalist service.  Hospital Course:  Sepsis causing colitis: Responded to fluids and antibiotics.  Noted mild drop in hemoglobin which may have been more due to initial hemoconcentration.  Initially placed on clear liquids which she tolerated.  Diet able to be advanced to soft bland by 7/14 which she tolerated this point, patient felt to  be okay for discharge on p.o. antibiotics.  Patient had met criteria for sepsis given leukocytosis, tachycardia colonic source Active Problems:   COPD (chronic obstructive pulmonary disease) ([place]): Breathing stable.     Type 2 diabetes mellitus with obesity ([place]): CBGs stable during hospitalization.     Chronic abdominal pain: Patient has been on p.o. narcotics scheduled at home.  Changed over to IV initially.     Cirrhosis of liver with ascites ([place]): Stable during this hospitalization.     Nausea & vomiting: Improved, with treatment of colitis and sepsis.    AKI (acute kidney injury) ([place]): Mild, improved with IV fluids    Hypotension: Some degree of chronic occurrence due to pain medication use.  Monitor blood pressure closely    Procedures: None  Consultations: None  Discharge Exam: BP (!) 124/58 (BP Location: Left Arm)   Pulse 74   Temp 98.7 F (37.1 C) (Oral)   Resp 14   Ht 5' (1.524 m)   Wt 103.6 kg   SpO2 100%   BMI 44.61 kg/m   General: Alert and oriented x3, no acute distress Cardiovascular: Regular rate and rhythm, S1-S2 Respiratory: Clear to auscultation bilaterally  Discharge Instructions You were cared for by a hospitalist during your hospital stay. If you have any questions about your discharge medications or the care you received while you were in the hospital after you are discharged, you can call the unit and asked to speak with the hospitalist on call if the hospitalist that took care of you is not available. Once you are discharged, your  primary care physician will handle any further medical issues. Please note that NO REFILLS for any discharge medications will be authorized once you are discharged, as it is imperative that you return to your primary care physician (or establish a relationship with a primary care physician if you do not have one) for your aftercare needs so that they can reassess your need for medications and monitor your lab  values.  Discharge Instructions     DIET SOFT   Complete by: As directed    Increase activity slowly   Complete by: As directed       Allergies as of 03/28/2021       Reactions   Spironolactone Nausea Only   nausea   Gabapentin Other (See Comments)   sleepwalking   Sulfamethoxazole-trimethoprim Hives   Amoxicillin Nausea And Vomiting   tolerated augmentin in the past (03/2013) without issue. Did it involve swelling of the face/tongue/throat, SOB, or low BP? No Did it involve sudden or severe rash/hives, skin peeling, or any reaction on the inside of your mouth or nose? No Did you need to seek medical attention at a hospital or doctor's office? No When did it last happen? unk       If all above answers are "NO", may proceed with cephalosporin use.   Clindamycin/lincomycin Nausea And Vomiting   Doxycycline Nausea And Vomiting   Treximet [sumatriptan-naproxen Sodium] Nausea And Vomiting   Bactrim Hives, Rash        Medication List     TAKE these medications    acetaminophen 500 MG tablet Commonly known as: TYLENOL Take 1,000 mg by mouth every 6 (six) hours as needed for moderate pain.   albuterol (2.5 MG/3ML) 0.083% nebulizer solution Commonly known as: PROVENTIL Take 2.5 mg by nebulization every 6 (six) hours as needed for wheezing or shortness of breath.   albuterol 108 (90 Base) MCG/ACT inhaler Commonly known as: VENTOLIN HFA Inhale 2 puffs into the lungs every 4 (four) hours as needed for wheezing or shortness of breath.   ALPRAZolam 1 MG tablet Commonly known as: XANAX Take 1-2 mg by mouth See admin instructions. 1 tablet in the morning 2 tablets at bedtime   amLODipine 5 MG tablet Commonly known as: NORVASC TAKE 1 TABLET BY MOUTH IN THE MORNING AND 1 TABLET AT BEDTIME What changed: See the new instructions.   benazepril 40 MG tablet Commonly known as: LOTENSIN Take 40 mg by mouth daily.   cefdinir 300 MG capsule Commonly known as: OMNICEF Take 1  capsule (300 mg total) by mouth every 12 (twelve) hours.   cloNIDine 0.1 MG tablet Commonly known as: CATAPRES Take 0.2 mg by mouth 2 (two) times daily as needed (blood pressure).   docusate sodium 250 MG capsule Commonly known as: COLACE Take 250 mg by mouth every evening.   escitalopram 20 MG tablet Commonly known as: LEXAPRO Take 20 mg by mouth every evening.   estradiol 2 MG tablet Commonly known as: ESTRACE Take 2 mg by mouth daily.   fluticasone 50 MCG/ACT nasal spray Commonly known as: FLONASE Place 1 spray into both nostrils daily as needed for allergies.   hydrOXYzine 25 MG tablet Commonly known as: ATARAX/VISTARIL Take 25 mg by mouth every 8 (eight) hours as needed for anxiety.   levocetirizine 5 MG tablet Commonly known as: XYZAL Take 5 mg by mouth every evening.   lidocaine 5 % ointment Commonly known as: XYLOCAINE Apply 1 application topically 2 (two) times daily as needed (hemorrhoids).  meloxicam 15 MG tablet Commonly known as: MOBIC Take 15 mg by mouth daily as needed for pain.   metroNIDAZOLE 500 MG tablet Commonly known as: FLAGYL Take 1 tablet (500 mg total) by mouth every 12 (twelve) hours.   naproxen sodium 220 MG tablet Commonly known as: ALEVE Take 440 mg by mouth daily as needed (pain).   Narcan 4 MG/0.1ML Liqd nasal spray kit Generic drug: naloxone Place 1 spray into the nose as needed (for overdose).   nystatin powder Commonly known as: MYCOSTATIN/NYSTOP Apply 1 g topically daily as needed (irritation. (Typically during Summer months)).   ondansetron 4 MG tablet Commonly known as: ZOFRAN Take 1 tablet (4 mg total) by mouth every 8 (eight) hours as needed for nausea or vomiting.   oxyCODONE 15 MG immediate release tablet Commonly known as: ROXICODONE Take 15 mg by mouth every 4 (four) hours.   pantoprazole 40 MG tablet Commonly known as: PROTONIX Take 40 mg by mouth 2 (two) times daily.   Phazyme Maximum Strength 250 MG  Caps Generic drug: Simethicone Take 250 mg by mouth 2 (two) times daily as needed (gas relief).   polyethylene glycol 17 g packet Commonly known as: MIRALAX / GLYCOLAX Take 17 g by mouth daily as needed for moderate constipation.   psyllium 58.6 % packet Commonly known as: METAMUCIL Take 1 packet by mouth daily as needed (regularity).   REFRESH DRY EYE THERAPY OP Place 1 drop into both eyes daily as needed (dry eyes).   simvastatin 20 MG tablet Commonly known as: ZOCOR Take 20 mg by mouth at bedtime.   Symbicort 160-4.5 MCG/ACT inhaler Generic drug: budesonide-formoterol Inhale 2 puffs into the lungs in the morning and at bedtime.   torsemide 20 MG tablet Commonly known as: DEMADEX Take 40 mg by mouth daily as needed (fluid).   traZODone 50 MG tablet Commonly known as: DESYREL Take 50-100 mg by mouth at bedtime.   triamcinolone cream 0.1 % Commonly known as: KENALOG Apply 1 application topically 2 (two) times daily as needed (rash).   VAGISIL EX Apply 1 application topically daily.       ASK your doctor about these medications    metFORMIN 500 MG tablet Commonly known as: GLUCOPHAGE Take 2 tablets (1,000 mg total) by mouth 2 (two) times daily with a meal.       Allergies  Allergen Reactions   Spironolactone Nausea Only    nausea   Gabapentin Other (See Comments)    sleepwalking   Sulfamethoxazole-Trimethoprim Hives   Amoxicillin Nausea And Vomiting    tolerated augmentin in the past (03/2013) without issue. Did it involve swelling of the face/tongue/throat, SOB, or low BP? No Did it involve sudden or severe rash/hives, skin peeling, or any reaction on the inside of your mouth or nose? No Did you need to seek medical attention at a hospital or doctor's office? No When did it last happen? unk       If all above answers are "NO", may proceed with cephalosporin use.    Clindamycin/Lincomycin Nausea And Vomiting   Doxycycline Nausea And Vomiting   Treximet  [Sumatriptan-Naproxen Sodium] Nausea And Vomiting   Bactrim Hives and Rash      The results of significant diagnostics from this hospitalization (including imaging, microbiology, ancillary and laboratory) are listed below for reference.    Significant Diagnostic Studies: CT Angio Abd/Pel W and/or Wo Contrast  Result Date: 03/26/2021 CLINICAL DATA:  GI bleeding with bloody diarrhea for several days and abdominal  pain EXAM: CTA ABDOMEN AND PELVIS WITHOUT AND WITH CONTRAST TECHNIQUE: Multidetector CT imaging of the abdomen and pelvis was performed using the standard protocol during bolus administration of intravenous contrast. Multiplanar reconstructed images and MIPs were obtained and reviewed to evaluate the vascular anatomy. CONTRAST:  193m OMNIPAQUE IOHEXOL 350 MG/ML SOLN COMPARISON:  02/08/2021 FINDINGS: VASCULAR Aorta: Atherosclerotic calcifications are noted without aneurysmal dilatation. Celiac: Celiac axis is within normal limits. Separate origin of the right inferior phrenic artery is noted directly from the aorta adjacent to the main celiac axis. SMA: Patent without evidence of aneurysm, dissection, vasculitis or significant stenosis. Replaced right hepatic artery is noted as well. Renals: Dual renal arteries are noted on the right with single renal artery on the left. Mild atherosclerotic changes are seen without focal stenosis. IMA: Patent without evidence of aneurysm, dissection, vasculitis or significant stenosis. Inflow: Iliacs are within normal limits with mild atherosclerotic calcification. No focal stenosis is noted. Veins: No specific venous abnormality is noted. Review of the MIP images confirms the above findings. NON-VASCULAR Lower chest: No acute abnormality. Hepatobiliary: Diffuse fatty infiltration of the liver is noted. The gallbladder is well distended without cholelithiasis. Mild perihepatic ascites is seen. Pancreas: Unremarkable. No pancreatic ductal dilatation or surrounding  inflammatory changes. Spleen: Stable cyst is noted within the spleen. Mild perisplenic ascites is noted as well. Adrenals/Urinary Tract: Adrenal glands are within normal limits. Kidneys demonstrate a normal enhancement pattern bilaterally. Scattered small cysts are noted. No renal calculi or obstructive changes are seen. The bladder is well distended. Stomach/Bowel: Scattered diverticular change of the colon is noted. Mild wall thickening is noted within the descending colon which may represent some focal colitis. The more proximal colon is within normal limits. No pooling of contrast material is noted within the colon to suggest acute hemorrhage. Small bowel and stomach are within normal limits. Lymphatic: Scattered small lymph nodes are noted adjacent to the gastric fundus as well as in the gastrohepatic ligament. Small periaortic nodes are seen but not significant by size criteria. These are likely reactive in nature. Reproductive: Status post hysterectomy. No adnexal masses. Other: Mild ascites is noted primarily surrounding the liver and spleen. Musculoskeletal: No acute or significant osseous findings. IMPRESSION: VASCULAR Scattered atherosclerotic changes are noted. Variant anatomy is seen. No findings to suggest active arterial GI bleeding. NON-VASCULAR Changes suggestive of colitis in the descending colon. No abscess or perforation is noted. It is uncertain whether this represents infectious or ischemic colitis. Diverticular changes noted without definitive diverticulitis. Fatty liver. Splenic and renal cysts. Scattered small lymph nodes are seen but not significant by size criteria. These are likely reactive in nature. Mild ascites. Electronically Signed   By: MInez CatalinaM.D.   On: 03/26/2021 18:30    Microbiology: Recent Results (from the past 240 hour(s))  Resp Panel by RT-PCR (Flu A&B, Covid) Nasopharyngeal Swab     Status: None   Collection Time: 03/26/21  6:58 PM   Specimen: Nasopharyngeal  Swab; Nasopharyngeal(NP) swabs in vial transport medium  Result Value Ref Range Status   SARS Coronavirus 2 by RT PCR NEGATIVE NEGATIVE Final    Comment: (NOTE) SARS-CoV-2 target nucleic acids are NOT DETECTED.  The SARS-CoV-2 RNA is generally detectable in upper respiratory specimens during the acute phase of infection. The lowest concentration of SARS-CoV-2 viral copies this assay can detect is 138 copies/mL. A negative result does not preclude SARS-Cov-2 infection and should not be used as the sole basis for treatment or other patient management decisions.  A negative result may occur with  improper specimen collection/handling, submission of specimen other than nasopharyngeal swab, presence of viral mutation(s) within the areas targeted by this assay, and inadequate number of viral copies(<138 copies/mL). A negative result must be combined with clinical observations, patient history, and epidemiological information. The expected result is Negative.  Fact Sheet for Patients:  EntrepreneurPulse.com.au  Fact Sheet for Healthcare Providers:  IncredibleEmployment.be  This test is no t yet approved or cleared by the [place] FDA and  has been authorized for detection and/or diagnosis of SARS-CoV-2 by FDA under an Emergency Use Authorization (EUA). This EUA will remain  in effect (meaning this test can be used) for the duration of the COVID-19 declaration under Section 564(b)(1) of the Act, 21 U.S.C.section 360bbb-3(b)(1), unless the authorization is terminated  or revoked sooner.       Influenza A by PCR NEGATIVE NEGATIVE Final   Influenza B by PCR NEGATIVE NEGATIVE Final    Comment: (NOTE) The Xpert Xpress SARS-CoV-2/FLU/RSV plus assay is intended as an aid in the diagnosis of influenza from Nasopharyngeal swab specimens and should not be used as a sole basis for treatment. Nasal washings and aspirates are unacceptable for Xpert Xpress  SARS-CoV-2/FLU/RSV testing.  Fact Sheet for Patients: EntrepreneurPulse.com.au  Fact Sheet for Healthcare Providers: IncredibleEmployment.be  This test is not yet approved or cleared by the [place] FDA and has been authorized for detection and/or diagnosis of SARS-CoV-2 by FDA under an Emergency Use Authorization (EUA). This EUA will remain in effect (meaning this test can be used) for the duration of the COVID-19 declaration under Section 564(b)(1) of the Act, 21 U.S.C. section 360bbb-3(b)(1), unless the authorization is terminated or revoked.  Performed at [facility], [place] [address] [place], [place] [address]   Culture, blood (Routine X 2) w Reflex to ID Panel     Status: None (Preliminary result)   Collection Time: 03/27/21  2:05 AM   Specimen: BLOOD  Result Value Ref Range Status   Specimen Description BLOOD RIGHT ANTECUBITAL  Final   Special Requests   Final    BOTTLES DRAWN AEROBIC AND ANAEROBIC Blood Culture adequate volume   Culture   Final    NO GROWTH 1 DAY Performed at [facility], [place] [address], [place], [place] [address]    Report Status PENDING  Incomplete  Culture, blood (Routine X 2) w Reflex to ID Panel     Status: None (Preliminary result)   Collection Time: 03/27/21  2:07 AM   Specimen: BLOOD RIGHT HAND  Result Value Ref Range Status   Specimen Description BLOOD RIGHT HAND  Final   Special Requests   Final    BOTTLES DRAWN AEROBIC AND ANAEROBIC Blood Culture adequate volume   Culture   Final    NO GROWTH 1 DAY Performed at [facility], [place] [address], [place], [place] [address]    Report Status PENDING  Incomplete  MRSA Next Gen by PCR, Nasal     Status: None   Collection Time: 03/27/21  3:38 AM   Specimen: Nasal Mucosa; Nasal Swab  Result Value Ref Range Status   MRSA by PCR Next Gen NOT DETECTED NOT DETECTED Final    Comment: (NOTE) The GeneXpert MRSA Assay (FDA approved for NASAL  specimens only), is one component of a comprehensive MRSA colonization surveillance program. It is not intended to diagnose MRSA infection nor to guide or monitor treatment for MRSA infections. Test performance is not  FDA approved in patients less than 42 years old. Performed at [facility], [place] [address] [place], [place] [address]      Labs: Basic Metabolic Panel: Recent Labs  Lab 03/26/21 1521 03/26/21 1548 03/27/21 0222 03/28/21 0050  NA 128*  --  133* 135  K 4.1  --  3.9 4.1  CL 94*  --  101 101  CO2 29  --  25 30  GLUCOSE 121*  --  138* 146*  BUN 15  --  12 <5*  CREATININE 1.12*  --  1.03* 0.74  CALCIUM 7.8*  --  7.6* 8.0*  MG  --  1.2* 1.4*  --    Liver Function Tests: Recent Labs  Lab 03/26/21 1521 03/27/21 0222  AST 22 22  ALT 17 16  ALKPHOS 66 65  BILITOT 1.0 0.6  PROT 6.7 6.7  ALBUMIN 2.9* 2.7*   No results for input(s): LIPASE, AMYLASE in the last 168 hours. Recent Labs  Lab 03/26/21 1541  AMMONIA 33   CBC: Recent Labs  Lab 03/26/21 1521 03/27/21 0222 03/28/21 0050  WBC 12.6* 11.0* 8.1  NEUTROABS  --  7.5  --   HGB 12.2 10.7* 10.6*  HCT 38.2 35.1* 33.6*  MCV 81.1 85.4 82.8  PLT 171 148* 146*   Cardiac Enzymes: No results for input(s): CKTOTAL, CKMB, CKMBINDEX, TROPONINI in the last 168 hours. BNP: BNP (last 3 results) Recent Labs    02/08/21 1349  BNP 57.1    ProBNP (last 3 results) No results for input(s): PROBNP in the last 8760 hours.  CBG: Recent Labs  Lab 03/27/21 1254 03/27/21 1707 03/27/21 2109 03/28/21 0801 03/28/21 1223  GLUCAP 228* 131* 121* 250* 94       Signed:  [name], MD [facility] 03/28/2021, 4:17 PM

## 2021-03-29 LAB — GLUCOSE, CAPILLARY: Glucose-Capillary: 208 mg/dL — ABNORMAL HIGH (ref 70–99)

## 2021-03-29 MED ORDER — OXYCODONE HCL 15 MG PO TABS: 15.0000 mg | ORAL_TABLET | Freq: Three times a day (TID) | ORAL | 0 refills | Status: AC | PRN

## 2021-03-29 NOTE — Plan of Care (Signed)
  Problem: Education: Goal: Knowledge of General Education information will improve Description: Including pain rating scale, medication(s)/side effects and non-pharmacologic comfort measures Outcome: Progressing   Problem: Clinical Measurements: Goal: Ability to maintain clinical measurements within normal limits will improve Outcome: Progressing   

## 2021-03-29 NOTE — Discharge Summary (Signed)
Discharge Summary  [name] WCB:[id number] DOB: June 24, 1961  PCP: Shirline Frees, MD  Admit date: 03/26/2021 Anticipated discharge date: 03/29/2021  Time spent: 35 minutes  Recommendations for Outpatient Follow-up:  New medication: Flagyl 500 mg every 12 hours x4 days New medication: Cefdinir 500 mg every 12 hours x4 days Oxy IR 15 mg p.o. every 8 hours as needed x5 pills  Discharge Diagnoses:  Active Hospital Problems   Diagnosis Date Noted   Sepsis, unspecified organism ([place]) 03/27/2021   Nausea & vomiting 03/27/2021   AKI (acute kidney injury) ([place]) 03/27/2021   Hypotension 03/27/2021   Colitis 03/26/2021   Cirrhosis of liver with ascites ([place]) 05/01/2019   Chronic abdominal pain 01/12/2019   Type 2 diabetes mellitus with obesity ([place]) 01/11/2019   Morbid obesity ([place]) 03/09/2013   COPD (chronic obstructive pulmonary disease) [facility])     Resolved Hospital Problems  No resolved problems to display.    Discharge Condition: Improved, being discharged home  Diet recommendation: Soft bland diet  Vitals:   03/29/21 0700 03/29/21 0818  BP: (!) 146/71   Pulse: 87   Resp: 18   Temp: 98.4 F (36.9 C)   SpO2: 97% 94%    History of present illness:  60 year old female with past medical history of chronic abdominal pain on chronic opioids, [place] cirrhosis, morbid obesity, COPD, diabetes mellitus and hypertension admitted on 7/12 for worsening abdominal pain over the past few days along with increased episodes of loose stools in the ED found to have sepsis from colitis.  Patient started on IV antibiotics and medication for pain and nausea and admitted to the hospitalist service.  Hospital Course:  Sepsis causing colitis: Responded to fluids and antibiotics.  Noted mild drop in hemoglobin which may have been more due to initial hemoconcentration.  Initially placed on clear liquids which she tolerated.  Diet able to be advanced to soft bland by 7/14 which she tolerated this  point, patient felt to be okay for discharge on p.o. antibiotics.  Patient had met criteria for sepsis given leukocytosis, tachycardia colonic source Active Problems:   COPD (chronic obstructive pulmonary disease) ([place]): Breathing stable.     Type 2 diabetes mellitus with obesity ([place]): CBGs stable during hospitalization.     Chronic abdominal pain: Patient has been on p.o. narcotics scheduled at home.  Changed over to IV initially.  Discharged home.  Have given prescription for 5 pills for acute pain     Cirrhosis of liver with ascites ([place]): Stable during this hospitalization.     Nausea & vomiting: Improved, with treatment of colitis and sepsis.    AKI (acute kidney injury) ([place]): Mild, improved with IV fluids    Hypotension: Some degree of chronic occurrence due to pain medication use.  Monitor blood pressure closely    Procedures: None  Consultations: None  Discharge Exam: BP (!) 146/71 (BP Location: Right Wrist)   Pulse 87   Temp 98.4 F (36.9 C) (Oral)   Resp 18   Ht 5' (1.524 m)   Wt 103.6 kg   SpO2 94%   BMI 44.61 kg/m   General: Alert and oriented x3, no acute distress Cardiovascular: Regular rate and rhythm, S1-S2 Respiratory: Clear to auscultation bilaterally  Discharge Instructions You were cared for by a hospitalist during your hospital stay. If you have any questions about your discharge medications or the care you received while you were in the hospital after you are discharged, you can call the unit and asked to speak  with the hospitalist on call if the hospitalist that took care of you is not available. Once you are discharged, your primary care physician will handle any further medical issues. Please note that NO REFILLS for any discharge medications will be authorized once you are discharged, as it is imperative that you return to your primary care physician (or establish a relationship with a primary care physician if you do not have one) for your  aftercare needs so that they can reassess your need for medications and monitor your lab values.  Discharge Instructions     DIET SOFT   Complete by: As directed    Increase activity slowly   Complete by: As directed       Allergies as of 03/29/2021       Reactions   Spironolactone Nausea Only   nausea   Gabapentin Other (See Comments)   sleepwalking   Sulfamethoxazole-trimethoprim Hives   Amoxicillin Nausea And Vomiting   tolerated augmentin in the past (03/2013) without issue. Did it involve swelling of the face/tongue/throat, SOB, or low BP? No Did it involve sudden or severe rash/hives, skin peeling, or any reaction on the inside of your mouth or nose? No Did you need to seek medical attention at a hospital or doctor's office? No When did it last happen? unk       If all above answers are "NO", may proceed with cephalosporin use.   Clindamycin/lincomycin Nausea And Vomiting   Doxycycline Nausea And Vomiting   Treximet [sumatriptan-naproxen Sodium] Nausea And Vomiting   Bactrim Hives, Rash        Medication List     TAKE these medications    acetaminophen 500 MG tablet Commonly known as: TYLENOL Take 1,000 mg by mouth every 6 (six) hours as needed for moderate pain.   albuterol (2.5 MG/3ML) 0.083% nebulizer solution Commonly known as: PROVENTIL Take 2.5 mg by nebulization every 6 (six) hours as needed for wheezing or shortness of breath.   albuterol 108 (90 Base) MCG/ACT inhaler Commonly known as: VENTOLIN HFA Inhale 2 puffs into the lungs every 4 (four) hours as needed for wheezing or shortness of breath.   ALPRAZolam 1 MG tablet Commonly known as: XANAX Take 1-2 mg by mouth See admin instructions. 1 tablet in the morning 2 tablets at bedtime   amLODipine 5 MG tablet Commonly known as: NORVASC TAKE 1 TABLET BY MOUTH IN THE MORNING AND 1 TABLET AT BEDTIME What changed: See the new instructions.   benazepril 40 MG tablet Commonly known as: LOTENSIN Take  40 mg by mouth daily.   cefdinir 300 MG capsule Commonly known as: OMNICEF Take 1 capsule (300 mg total) by mouth every 12 (twelve) hours.   cloNIDine 0.1 MG tablet Commonly known as: CATAPRES Take 0.2 mg by mouth 2 (two) times daily as needed (blood pressure).   docusate sodium 250 MG capsule Commonly known as: COLACE Take 250 mg by mouth every evening.   escitalopram 20 MG tablet Commonly known as: LEXAPRO Take 20 mg by mouth every evening.   estradiol 2 MG tablet Commonly known as: ESTRACE Take 2 mg by mouth daily.   fluticasone 50 MCG/ACT nasal spray Commonly known as: FLONASE Place 1 spray into both nostrils daily as needed for allergies.   hydrOXYzine 25 MG tablet Commonly known as: ATARAX/VISTARIL Take 25 mg by mouth every 8 (eight) hours as needed for anxiety.   levocetirizine 5 MG tablet Commonly known as: XYZAL Take 5 mg by mouth every evening.  lidocaine 5 % ointment Commonly known as: XYLOCAINE Apply 1 application topically 2 (two) times daily as needed (hemorrhoids).   meloxicam 15 MG tablet Commonly known as: MOBIC Take 15 mg by mouth daily as needed for pain.   metroNIDAZOLE 500 MG tablet Commonly known as: FLAGYL Take 1 tablet (500 mg total) by mouth every 12 (twelve) hours.   naproxen sodium 220 MG tablet Commonly known as: ALEVE Take 440 mg by mouth daily as needed (pain).   Narcan 4 MG/0.1ML Liqd nasal spray kit Generic drug: naloxone Place 1 spray into the nose as needed (for overdose).   nystatin powder Commonly known as: MYCOSTATIN/NYSTOP Apply 1 g topically daily as needed (irritation. (Typically during Summer months)).   ondansetron 4 MG tablet Commonly known as: ZOFRAN Take 1 tablet (4 mg total) by mouth every 8 (eight) hours as needed for nausea or vomiting.   oxyCODONE 15 MG immediate release tablet Commonly known as: ROXICODONE Take 1 tablet (15 mg total) by mouth every 8 (eight) hours as needed for pain. What changed:   when to take this reasons to take this   pantoprazole 40 MG tablet Commonly known as: PROTONIX Take 40 mg by mouth 2 (two) times daily.   Phazyme Maximum Strength 250 MG Caps Generic drug: Simethicone Take 250 mg by mouth 2 (two) times daily as needed (gas relief).   polyethylene glycol 17 g packet Commonly known as: MIRALAX / GLYCOLAX Take 17 g by mouth daily as needed for moderate constipation.   psyllium 58.6 % packet Commonly known as: METAMUCIL Take 1 packet by mouth daily as needed (regularity).   REFRESH DRY EYE THERAPY OP Place 1 drop into both eyes daily as needed (dry eyes).   simvastatin 20 MG tablet Commonly known as: ZOCOR Take 20 mg by mouth at bedtime.   Symbicort 160-4.5 MCG/ACT inhaler Generic drug: budesonide-formoterol Inhale 2 puffs into the lungs in the morning and at bedtime.   torsemide 20 MG tablet Commonly known as: DEMADEX Take 40 mg by mouth daily as needed (fluid).   traZODone 50 MG tablet Commonly known as: DESYREL Take 50-100 mg by mouth at bedtime.   triamcinolone cream 0.1 % Commonly known as: KENALOG Apply 1 application topically 2 (two) times daily as needed (rash).   VAGISIL EX Apply 1 application topically daily.       ASK your doctor about these medications    metFORMIN 500 MG tablet Commonly known as: GLUCOPHAGE Take 2 tablets (1,000 mg total) by mouth 2 (two) times daily with a meal.       Allergies  Allergen Reactions   Spironolactone Nausea Only    nausea   Gabapentin Other (See Comments)    sleepwalking   Sulfamethoxazole-Trimethoprim Hives   Amoxicillin Nausea And Vomiting    tolerated augmentin in the past (03/2013) without issue. Did it involve swelling of the face/tongue/throat, SOB, or low BP? No Did it involve sudden or severe rash/hives, skin peeling, or any reaction on the inside of your mouth or nose? No Did you need to seek medical attention at a hospital or doctor's office? No When did it last  happen? unk       If all above answers are "NO", may proceed with cephalosporin use.    Clindamycin/Lincomycin Nausea And Vomiting   Doxycycline Nausea And Vomiting   Treximet [Sumatriptan-Naproxen Sodium] Nausea And Vomiting   Bactrim Hives and Rash      The results of significant diagnostics from this hospitalization (including imaging,  microbiology, ancillary and laboratory) are listed below for reference.    Significant Diagnostic Studies: CT Angio Abd/Pel W and/or Wo Contrast  Result Date: 03/26/2021 CLINICAL DATA:  GI bleeding with bloody diarrhea for several days and abdominal pain EXAM: CTA ABDOMEN AND PELVIS WITHOUT AND WITH CONTRAST TECHNIQUE: Multidetector CT imaging of the abdomen and pelvis was performed using the standard protocol during bolus administration of intravenous contrast. Multiplanar reconstructed images and MIPs were obtained and reviewed to evaluate the vascular anatomy. CONTRAST:  149m OMNIPAQUE IOHEXOL 350 MG/ML SOLN COMPARISON:  02/08/2021 FINDINGS: VASCULAR Aorta: Atherosclerotic calcifications are noted without aneurysmal dilatation. Celiac: Celiac axis is within normal limits. Separate origin of the right inferior phrenic artery is noted directly from the aorta adjacent to the main celiac axis. SMA: Patent without evidence of aneurysm, dissection, vasculitis or significant stenosis. Replaced right hepatic artery is noted as well. Renals: Dual renal arteries are noted on the right with single renal artery on the left. Mild atherosclerotic changes are seen without focal stenosis. IMA: Patent without evidence of aneurysm, dissection, vasculitis or significant stenosis. Inflow: Iliacs are within normal limits with mild atherosclerotic calcification. No focal stenosis is noted. Veins: No specific venous abnormality is noted. Review of the MIP images confirms the above findings. NON-VASCULAR Lower chest: No acute abnormality. Hepatobiliary: Diffuse fatty infiltration of  the liver is noted. The gallbladder is well distended without cholelithiasis. Mild perihepatic ascites is seen. Pancreas: Unremarkable. No pancreatic ductal dilatation or surrounding inflammatory changes. Spleen: Stable cyst is noted within the spleen. Mild perisplenic ascites is noted as well. Adrenals/Urinary Tract: Adrenal glands are within normal limits. Kidneys demonstrate a normal enhancement pattern bilaterally. Scattered small cysts are noted. No renal calculi or obstructive changes are seen. The bladder is well distended. Stomach/Bowel: Scattered diverticular change of the colon is noted. Mild wall thickening is noted within the descending colon which may represent some focal colitis. The more proximal colon is within normal limits. No pooling of contrast material is noted within the colon to suggest acute hemorrhage. Small bowel and stomach are within normal limits. Lymphatic: Scattered small lymph nodes are noted adjacent to the gastric fundus as well as in the gastrohepatic ligament. Small periaortic nodes are seen but not significant by size criteria. These are likely reactive in nature. Reproductive: Status post hysterectomy. No adnexal masses. Other: Mild ascites is noted primarily surrounding the liver and spleen. Musculoskeletal: No acute or significant osseous findings. IMPRESSION: VASCULAR Scattered atherosclerotic changes are noted. Variant anatomy is seen. No findings to suggest active arterial GI bleeding. NON-VASCULAR Changes suggestive of colitis in the descending colon. No abscess or perforation is noted. It is uncertain whether this represents infectious or ischemic colitis. Diverticular changes noted without definitive diverticulitis. Fatty liver. Splenic and renal cysts. Scattered small lymph nodes are seen but not significant by size criteria. These are likely reactive in nature. Mild ascites. Electronically Signed   By: MInez CatalinaM.D.   On: 03/26/2021 18:30    Microbiology: Recent  Results (from the past 240 hour(s))  Resp Panel by RT-PCR (Flu A&B, Covid) Nasopharyngeal Swab     Status: None   Collection Time: 03/26/21  6:58 PM   Specimen: Nasopharyngeal Swab; Nasopharyngeal(NP) swabs in vial transport medium  Result Value Ref Range Status   SARS Coronavirus 2 by RT PCR NEGATIVE NEGATIVE Final    Comment: (NOTE) SARS-CoV-2 target nucleic acids are NOT DETECTED.  The SARS-CoV-2 RNA is generally detectable in upper respiratory specimens during the acute phase of  infection. The lowest concentration of SARS-CoV-2 viral copies this assay can detect is 138 copies/mL. A negative result does not preclude SARS-Cov-2 infection and should not be used as the sole basis for treatment or other patient management decisions. A negative result may occur with  improper specimen collection/handling, submission of specimen other than nasopharyngeal swab, presence of viral mutation(s) within the areas targeted by this assay, and inadequate number of viral copies(<138 copies/mL). A negative result must be combined with clinical observations, patient history, and epidemiological information. The expected result is Negative.  Fact Sheet for Patients:  EntrepreneurPulse.com.au  Fact Sheet for Healthcare Providers:  IncredibleEmployment.be  This test is no t yet approved or cleared by the [place] FDA and  has been authorized for detection and/or diagnosis of SARS-CoV-2 by FDA under an Emergency Use Authorization (EUA). This EUA will remain  in effect (meaning this test can be used) for the duration of the COVID-19 declaration under Section 564(b)(1) of the Act, 21 U.S.C.section 360bbb-3(b)(1), unless the authorization is terminated  or revoked sooner.       Influenza A by PCR NEGATIVE NEGATIVE Final   Influenza B by PCR NEGATIVE NEGATIVE Final    Comment: (NOTE) The Xpert Xpress SARS-CoV-2/FLU/RSV plus assay is intended as an aid in the  diagnosis of influenza from Nasopharyngeal swab specimens and should not be used as a sole basis for treatment. Nasal washings and aspirates are unacceptable for Xpert Xpress SARS-CoV-2/FLU/RSV testing.  Fact Sheet for Patients: EntrepreneurPulse.com.au  Fact Sheet for Healthcare Providers: IncredibleEmployment.be  This test is not yet approved or cleared by the [place] FDA and has been authorized for detection and/or diagnosis of SARS-CoV-2 by FDA under an Emergency Use Authorization (EUA). This EUA will remain in effect (meaning this test can be used) for the duration of the COVID-19 declaration under Section 564(b)(1) of the Act, 21 U.S.C. section 360bbb-3(b)(1), unless the authorization is terminated or revoked.  Performed at [facility], [place] [address], [place], [place] [address]   Culture, blood (Routine X 2) w Reflex to ID Panel     Status: None (Preliminary result)   Collection Time: 03/27/21  2:05 AM   Specimen: BLOOD  Result Value Ref Range Status   Specimen Description BLOOD RIGHT ANTECUBITAL  Final   Special Requests   Final    BOTTLES DRAWN AEROBIC AND ANAEROBIC Blood Culture adequate volume   Culture   Final    NO GROWTH 2 DAYS Performed at [facility], [place] [address], [place], [place] [address]    Report Status PENDING  Incomplete  Culture, blood (Routine X 2) w Reflex to ID Panel     Status: None (Preliminary result)   Collection Time: 03/27/21  2:07 AM   Specimen: BLOOD RIGHT HAND  Result Value Ref Range Status   Specimen Description BLOOD RIGHT HAND  Final   Special Requests   Final    BOTTLES DRAWN AEROBIC AND ANAEROBIC Blood Culture adequate volume   Culture   Final    NO GROWTH 2 DAYS Performed at [facility], [place] [address], [place], [place] [address]    Report Status PENDING  Incomplete  MRSA Next Gen by PCR, Nasal     Status: None   Collection Time: 03/27/21  3:38 AM   Specimen: Nasal  Mucosa; Nasal Swab  Result Value Ref Range Status   MRSA by PCR Next Gen NOT DETECTED NOT DETECTED Final    Comment: (NOTE) The GeneXpert  MRSA Assay (FDA approved for NASAL specimens only), is one component of a comprehensive MRSA colonization surveillance program. It is not intended to diagnose MRSA infection nor to guide or monitor treatment for MRSA infections. Test performance is not FDA approved in patients less than 84 years old. Performed at [facility], [place] [address], [place], [place] [address]      Labs: Basic Metabolic Panel: Recent Labs  Lab 03/26/21 1521 03/26/21 1548 03/27/21 0222 03/28/21 0050  NA 128*  --  133* 135  K 4.1  --  3.9 4.1  CL 94*  --  101 101  CO2 29  --  25 30  GLUCOSE 121*  --  138* 146*  BUN 15  --  12 <5*  CREATININE 1.12*  --  1.03* 0.74  CALCIUM 7.8*  --  7.6* 8.0*  MG  --  1.2* 1.4*  --     Liver Function Tests: Recent Labs  Lab 03/26/21 1521 03/27/21 0222  AST 22 22  ALT 17 16  ALKPHOS 66 65  BILITOT 1.0 0.6  PROT 6.7 6.7  ALBUMIN 2.9* 2.7*    No results for input(s): LIPASE, AMYLASE in the last 168 hours. Recent Labs  Lab 03/26/21 1541  AMMONIA 33    CBC: Recent Labs  Lab 03/26/21 1521 03/27/21 0222 03/28/21 0050  WBC 12.6* 11.0* 8.1  NEUTROABS  --  7.5  --   HGB 12.2 10.7* 10.6*  HCT 38.2 35.1* 33.6*  MCV 81.1 85.4 82.8  PLT 171 148* 146*    Cardiac Enzymes: No results for input(s): CKTOTAL, CKMB, CKMBINDEX, TROPONINI in the last 168 hours. BNP: BNP (last 3 results) Recent Labs    02/08/21 1349  BNP 57.1     ProBNP (last 3 results) No results for input(s): PROBNP in the last 8760 hours.  CBG: Recent Labs  Lab 03/28/21 0801 03/28/21 1223 03/28/21 1627 03/28/21 2121 03/29/21 0820  GLUCAP 250* 94 171* 157* 208*        Signed:  [name], MD [facility] 03/29/2021, 1:09 PM

## 2021-03-29 NOTE — Care Management Important Message (Signed)
Important Message  Patient Details  Name: [name] MRN: [id number] Date of Birth: 05/14/1961   Medicare Important Message Given:  Yes - Important Message mailed due to current National Emergency   Verbal consent obtained due to current National Emergency  Relationship to patient: Self Contact Name: Lilac Call Date: 03/29/21  Time: 1212 Phone: [phone] Outcome: Spoke with contact Important Message mailed to: Patient address on file   [place] 03/29/2021, 12:12 PM

## 2021-04-01 DIAGNOSIS — E559 Vitamin D deficiency, unspecified: Secondary | ICD-10-CM | POA: Diagnosis not present

## 2021-04-01 DIAGNOSIS — Z79899 Other long term (current) drug therapy: Secondary | ICD-10-CM | POA: Diagnosis not present

## 2021-04-01 DIAGNOSIS — Z9181 History of falling: Secondary | ICD-10-CM | POA: Diagnosis not present

## 2021-04-01 DIAGNOSIS — M545 Low back pain, unspecified: Secondary | ICD-10-CM | POA: Diagnosis not present

## 2021-04-01 DIAGNOSIS — F17210 Nicotine dependence, cigarettes, uncomplicated: Secondary | ICD-10-CM | POA: Diagnosis not present

## 2021-04-01 DIAGNOSIS — M129 Arthropathy, unspecified: Secondary | ICD-10-CM | POA: Diagnosis not present

## 2021-04-01 DIAGNOSIS — Z1159 Encounter for screening for other viral diseases: Secondary | ICD-10-CM | POA: Diagnosis not present

## 2021-04-01 DIAGNOSIS — M542 Cervicalgia: Secondary | ICD-10-CM | POA: Diagnosis not present

## 2021-04-01 LAB — CULTURE, BLOOD (ROUTINE X 2)
Culture: NO GROWTH
Culture: NO GROWTH
Special Requests: ADEQUATE
Special Requests: ADEQUATE

## 2021-04-08 DIAGNOSIS — K7469 Other cirrhosis of liver: Secondary | ICD-10-CM | POA: Diagnosis not present

## 2021-04-08 DIAGNOSIS — Z8619 Personal history of other infectious and parasitic diseases: Secondary | ICD-10-CM | POA: Diagnosis not present

## 2021-04-08 DIAGNOSIS — G894 Chronic pain syndrome: Secondary | ICD-10-CM | POA: Diagnosis not present

## 2021-04-08 DIAGNOSIS — I10 Essential (primary) hypertension: Secondary | ICD-10-CM | POA: Diagnosis not present

## 2021-04-08 DIAGNOSIS — E7800 Pure hypercholesterolemia, unspecified: Secondary | ICD-10-CM | POA: Diagnosis not present

## 2021-04-08 DIAGNOSIS — E119 Type 2 diabetes mellitus without complications: Secondary | ICD-10-CM | POA: Diagnosis not present

## 2021-04-08 DIAGNOSIS — G43909 Migraine, unspecified, not intractable, without status migrainosus: Secondary | ICD-10-CM | POA: Diagnosis not present

## 2021-04-16 ENCOUNTER — Other Ambulatory Visit: Payer: Self-pay | Admitting: Physician Assistant

## 2021-04-16 ENCOUNTER — Other Ambulatory Visit: Payer: Self-pay | Admitting: *Deleted

## 2021-04-19 DIAGNOSIS — M545 Low back pain, unspecified: Secondary | ICD-10-CM | POA: Diagnosis not present

## 2021-04-19 DIAGNOSIS — K7460 Unspecified cirrhosis of liver: Secondary | ICD-10-CM | POA: Diagnosis not present

## 2021-04-19 DIAGNOSIS — M542 Cervicalgia: Secondary | ICD-10-CM | POA: Diagnosis not present

## 2021-04-19 DIAGNOSIS — F17210 Nicotine dependence, cigarettes, uncomplicated: Secondary | ICD-10-CM | POA: Diagnosis not present

## 2021-04-19 DIAGNOSIS — Z79899 Other long term (current) drug therapy: Secondary | ICD-10-CM | POA: Diagnosis not present

## 2021-04-19 DIAGNOSIS — R109 Unspecified abdominal pain: Secondary | ICD-10-CM | POA: Diagnosis not present

## 2021-04-29 DIAGNOSIS — F17210 Nicotine dependence, cigarettes, uncomplicated: Secondary | ICD-10-CM | POA: Diagnosis not present

## 2021-04-29 DIAGNOSIS — M545 Low back pain, unspecified: Secondary | ICD-10-CM | POA: Diagnosis not present

## 2021-04-29 DIAGNOSIS — K7460 Unspecified cirrhosis of liver: Secondary | ICD-10-CM | POA: Diagnosis not present

## 2021-04-29 DIAGNOSIS — Z79899 Other long term (current) drug therapy: Secondary | ICD-10-CM | POA: Diagnosis not present

## 2021-04-29 DIAGNOSIS — M542 Cervicalgia: Secondary | ICD-10-CM | POA: Diagnosis not present

## 2021-05-28 DIAGNOSIS — Z79899 Other long term (current) drug therapy: Secondary | ICD-10-CM | POA: Diagnosis not present

## 2021-05-28 DIAGNOSIS — F17210 Nicotine dependence, cigarettes, uncomplicated: Secondary | ICD-10-CM | POA: Diagnosis not present

## 2021-05-28 DIAGNOSIS — M545 Low back pain, unspecified: Secondary | ICD-10-CM | POA: Diagnosis not present

## 2021-05-28 DIAGNOSIS — M542 Cervicalgia: Secondary | ICD-10-CM | POA: Diagnosis not present

## 2021-06-20 ENCOUNTER — Other Ambulatory Visit: Payer: Self-pay | Admitting: Family Medicine

## 2021-06-20 DIAGNOSIS — Z1231 Encounter for screening mammogram for malignant neoplasm of breast: Secondary | ICD-10-CM

## 2021-06-24 DIAGNOSIS — F17210 Nicotine dependence, cigarettes, uncomplicated: Secondary | ICD-10-CM | POA: Diagnosis not present

## 2021-06-24 DIAGNOSIS — M542 Cervicalgia: Secondary | ICD-10-CM | POA: Diagnosis not present

## 2021-06-24 DIAGNOSIS — M545 Low back pain, unspecified: Secondary | ICD-10-CM | POA: Diagnosis not present

## 2021-06-24 DIAGNOSIS — Z79899 Other long term (current) drug therapy: Secondary | ICD-10-CM | POA: Diagnosis not present

## 2021-07-02 DIAGNOSIS — Z0000 Encounter for general adult medical examination without abnormal findings: Secondary | ICD-10-CM | POA: Diagnosis not present

## 2021-07-02 DIAGNOSIS — E119 Type 2 diabetes mellitus without complications: Secondary | ICD-10-CM | POA: Diagnosis not present

## 2021-07-02 DIAGNOSIS — L03116 Cellulitis of left lower limb: Secondary | ICD-10-CM | POA: Diagnosis not present

## 2021-07-02 DIAGNOSIS — I872 Venous insufficiency (chronic) (peripheral): Secondary | ICD-10-CM | POA: Diagnosis not present

## 2021-07-02 DIAGNOSIS — I10 Essential (primary) hypertension: Secondary | ICD-10-CM | POA: Diagnosis not present

## 2021-07-02 DIAGNOSIS — G8929 Other chronic pain: Secondary | ICD-10-CM | POA: Diagnosis not present

## 2021-07-18 ENCOUNTER — Ambulatory Visit
Admission: RE | Admit: 2021-07-18 | Discharge: 2021-07-18 | Disposition: A | Discharge status: Home / Self Care | Payer: Medicare Other | Source: Ambulatory Visit | Attending: Family Medicine | Admitting: Family Medicine

## 2021-07-18 DIAGNOSIS — Z1231 Encounter for screening mammogram for malignant neoplasm of breast: Secondary | ICD-10-CM | POA: Diagnosis not present

## 2021-07-19 DIAGNOSIS — F17210 Nicotine dependence, cigarettes, uncomplicated: Secondary | ICD-10-CM | POA: Diagnosis not present

## 2021-07-19 DIAGNOSIS — Z79899 Other long term (current) drug therapy: Secondary | ICD-10-CM | POA: Diagnosis not present

## 2021-07-19 DIAGNOSIS — M545 Low back pain, unspecified: Secondary | ICD-10-CM | POA: Diagnosis not present

## 2021-07-19 DIAGNOSIS — M542 Cervicalgia: Secondary | ICD-10-CM | POA: Diagnosis not present

## 2021-08-22 DIAGNOSIS — M542 Cervicalgia: Secondary | ICD-10-CM | POA: Diagnosis not present

## 2021-08-22 DIAGNOSIS — M545 Low back pain, unspecified: Secondary | ICD-10-CM | POA: Diagnosis not present

## 2021-08-22 DIAGNOSIS — F17210 Nicotine dependence, cigarettes, uncomplicated: Secondary | ICD-10-CM | POA: Diagnosis not present

## 2021-08-22 DIAGNOSIS — Z79899 Other long term (current) drug therapy: Secondary | ICD-10-CM | POA: Diagnosis not present

## 2021-09-18 DIAGNOSIS — I10 Essential (primary) hypertension: Secondary | ICD-10-CM | POA: Diagnosis not present

## 2021-09-18 DIAGNOSIS — E7800 Pure hypercholesterolemia, unspecified: Secondary | ICD-10-CM | POA: Diagnosis not present

## 2021-09-18 DIAGNOSIS — K7460 Unspecified cirrhosis of liver: Secondary | ICD-10-CM | POA: Diagnosis not present

## 2021-09-18 DIAGNOSIS — E119 Type 2 diabetes mellitus without complications: Secondary | ICD-10-CM | POA: Diagnosis not present

## 2021-09-18 DIAGNOSIS — K7581 Nonalcoholic steatohepatitis (NASH): Secondary | ICD-10-CM | POA: Diagnosis not present

## 2021-09-18 DIAGNOSIS — R1084 Generalized abdominal pain: Secondary | ICD-10-CM | POA: Diagnosis not present

## 2021-09-18 DIAGNOSIS — I2510 Atherosclerotic heart disease of native coronary artery without angina pectoris: Secondary | ICD-10-CM | POA: Diagnosis not present

## 2021-09-19 DIAGNOSIS — M545 Low back pain, unspecified: Secondary | ICD-10-CM | POA: Diagnosis not present

## 2021-09-19 DIAGNOSIS — M542 Cervicalgia: Secondary | ICD-10-CM | POA: Diagnosis not present

## 2021-09-19 DIAGNOSIS — F17210 Nicotine dependence, cigarettes, uncomplicated: Secondary | ICD-10-CM | POA: Diagnosis not present

## 2021-09-19 DIAGNOSIS — Z79899 Other long term (current) drug therapy: Secondary | ICD-10-CM | POA: Diagnosis not present

## 2021-10-24 DIAGNOSIS — M545 Low back pain, unspecified: Secondary | ICD-10-CM | POA: Diagnosis not present

## 2021-10-24 DIAGNOSIS — E559 Vitamin D deficiency, unspecified: Secondary | ICD-10-CM | POA: Diagnosis not present

## 2021-10-24 DIAGNOSIS — F17210 Nicotine dependence, cigarettes, uncomplicated: Secondary | ICD-10-CM | POA: Diagnosis not present

## 2021-10-24 DIAGNOSIS — K7460 Unspecified cirrhosis of liver: Secondary | ICD-10-CM | POA: Diagnosis not present

## 2021-10-24 DIAGNOSIS — Z79899 Other long term (current) drug therapy: Secondary | ICD-10-CM | POA: Diagnosis not present

## 2021-10-24 DIAGNOSIS — M542 Cervicalgia: Secondary | ICD-10-CM | POA: Diagnosis not present

## 2021-11-21 DIAGNOSIS — M542 Cervicalgia: Secondary | ICD-10-CM | POA: Diagnosis not present

## 2021-11-21 DIAGNOSIS — M545 Low back pain, unspecified: Secondary | ICD-10-CM | POA: Diagnosis not present

## 2021-11-21 DIAGNOSIS — F17210 Nicotine dependence, cigarettes, uncomplicated: Secondary | ICD-10-CM | POA: Diagnosis not present

## 2021-11-21 DIAGNOSIS — Z79899 Other long term (current) drug therapy: Secondary | ICD-10-CM | POA: Diagnosis not present

## 2021-11-21 DIAGNOSIS — Z87891 Personal history of nicotine dependence: Secondary | ICD-10-CM | POA: Diagnosis not present

## 2021-12-17 DIAGNOSIS — M545 Low back pain, unspecified: Secondary | ICD-10-CM | POA: Diagnosis not present

## 2021-12-17 DIAGNOSIS — G8929 Other chronic pain: Secondary | ICD-10-CM | POA: Diagnosis not present

## 2021-12-17 DIAGNOSIS — E7800 Pure hypercholesterolemia, unspecified: Secondary | ICD-10-CM | POA: Diagnosis not present

## 2021-12-17 DIAGNOSIS — I2510 Atherosclerotic heart disease of native coronary artery without angina pectoris: Secondary | ICD-10-CM | POA: Diagnosis not present

## 2021-12-17 DIAGNOSIS — R040 Epistaxis: Secondary | ICD-10-CM | POA: Diagnosis not present

## 2021-12-17 DIAGNOSIS — K7460 Unspecified cirrhosis of liver: Secondary | ICD-10-CM | POA: Diagnosis not present

## 2021-12-17 DIAGNOSIS — E119 Type 2 diabetes mellitus without complications: Secondary | ICD-10-CM | POA: Diagnosis not present

## 2021-12-17 DIAGNOSIS — I10 Essential (primary) hypertension: Secondary | ICD-10-CM | POA: Diagnosis not present

## 2021-12-17 DIAGNOSIS — K7581 Nonalcoholic steatohepatitis (NASH): Secondary | ICD-10-CM | POA: Diagnosis not present

## 2021-12-17 DIAGNOSIS — R1084 Generalized abdominal pain: Secondary | ICD-10-CM | POA: Diagnosis not present

## 2021-12-19 DIAGNOSIS — M545 Low back pain, unspecified: Secondary | ICD-10-CM | POA: Diagnosis not present

## 2021-12-19 DIAGNOSIS — Z87891 Personal history of nicotine dependence: Secondary | ICD-10-CM | POA: Diagnosis not present

## 2021-12-19 DIAGNOSIS — Z79899 Other long term (current) drug therapy: Secondary | ICD-10-CM | POA: Diagnosis not present

## 2021-12-19 DIAGNOSIS — M542 Cervicalgia: Secondary | ICD-10-CM | POA: Diagnosis not present

## 2021-12-19 DIAGNOSIS — F17210 Nicotine dependence, cigarettes, uncomplicated: Secondary | ICD-10-CM | POA: Diagnosis not present

## 2021-12-23 DIAGNOSIS — Z79899 Other long term (current) drug therapy: Secondary | ICD-10-CM | POA: Diagnosis not present

## 2022-01-16 DIAGNOSIS — M542 Cervicalgia: Secondary | ICD-10-CM | POA: Diagnosis not present

## 2022-01-16 DIAGNOSIS — Z79899 Other long term (current) drug therapy: Secondary | ICD-10-CM | POA: Diagnosis not present

## 2022-01-16 DIAGNOSIS — K7460 Unspecified cirrhosis of liver: Secondary | ICD-10-CM | POA: Diagnosis not present

## 2022-01-16 DIAGNOSIS — M545 Low back pain, unspecified: Secondary | ICD-10-CM | POA: Diagnosis not present

## 2022-02-14 DIAGNOSIS — G8929 Other chronic pain: Secondary | ICD-10-CM | POA: Diagnosis not present

## 2022-02-14 DIAGNOSIS — M1611 Unilateral primary osteoarthritis, right hip: Secondary | ICD-10-CM | POA: Diagnosis not present

## 2022-02-14 DIAGNOSIS — K7581 Nonalcoholic steatohepatitis (NASH): Secondary | ICD-10-CM | POA: Diagnosis not present

## 2022-02-14 DIAGNOSIS — M549 Dorsalgia, unspecified: Secondary | ICD-10-CM | POA: Diagnosis not present

## 2022-02-14 DIAGNOSIS — K7460 Unspecified cirrhosis of liver: Secondary | ICD-10-CM | POA: Diagnosis not present

## 2022-02-14 DIAGNOSIS — D539 Nutritional anemia, unspecified: Secondary | ICD-10-CM | POA: Diagnosis not present

## 2022-02-14 DIAGNOSIS — Z0130 Encounter for examination of blood pressure without abnormal findings: Secondary | ICD-10-CM | POA: Diagnosis not present

## 2022-02-14 DIAGNOSIS — Z79899 Other long term (current) drug therapy: Secondary | ICD-10-CM | POA: Diagnosis not present

## 2022-02-20 DIAGNOSIS — Z79899 Other long term (current) drug therapy: Secondary | ICD-10-CM | POA: Diagnosis not present

## 2022-02-21 DIAGNOSIS — M542 Cervicalgia: Secondary | ICD-10-CM | POA: Diagnosis not present

## 2022-02-21 DIAGNOSIS — M545 Low back pain, unspecified: Secondary | ICD-10-CM | POA: Diagnosis not present

## 2022-02-21 DIAGNOSIS — K7460 Unspecified cirrhosis of liver: Secondary | ICD-10-CM | POA: Diagnosis not present

## 2022-02-21 DIAGNOSIS — R109 Unspecified abdominal pain: Secondary | ICD-10-CM | POA: Diagnosis not present

## 2022-02-21 DIAGNOSIS — Z79899 Other long term (current) drug therapy: Secondary | ICD-10-CM | POA: Diagnosis not present

## 2022-02-27 ENCOUNTER — Other Ambulatory Visit (HOSPITAL_BASED_OUTPATIENT_CLINIC_OR_DEPARTMENT_OTHER): Payer: Self-pay

## 2022-02-27 ENCOUNTER — Emergency Department (HOSPITAL_BASED_OUTPATIENT_CLINIC_OR_DEPARTMENT_OTHER)
Admission: EM | Admit: 2022-02-27 | Discharge: 2022-02-27 | Disposition: A | Discharge status: Home / Self Care | Payer: Medicare Other | Attending: Emergency Medicine | Admitting: Emergency Medicine

## 2022-02-27 ENCOUNTER — Encounter (HOSPITAL_BASED_OUTPATIENT_CLINIC_OR_DEPARTMENT_OTHER): Payer: Self-pay | Admitting: Emergency Medicine

## 2022-02-27 ENCOUNTER — Emergency Department (HOSPITAL_BASED_OUTPATIENT_CLINIC_OR_DEPARTMENT_OTHER): Payer: Medicare Other

## 2022-02-27 ENCOUNTER — Other Ambulatory Visit: Payer: Self-pay

## 2022-02-27 DIAGNOSIS — Z79891 Long term (current) use of opiate analgesic: Secondary | ICD-10-CM | POA: Diagnosis not present

## 2022-02-27 DIAGNOSIS — K5792 Diverticulitis of intestine, part unspecified, without perforation or abscess without bleeding: Secondary | ICD-10-CM

## 2022-02-27 DIAGNOSIS — K6389 Other specified diseases of intestine: Secondary | ICD-10-CM | POA: Diagnosis not present

## 2022-02-27 DIAGNOSIS — R1084 Generalized abdominal pain: Secondary | ICD-10-CM | POA: Diagnosis present

## 2022-02-27 DIAGNOSIS — G8929 Other chronic pain: Secondary | ICD-10-CM | POA: Diagnosis not present

## 2022-02-27 LAB — COMPREHENSIVE METABOLIC PANEL
ALT: 15 U/L (ref 0–44)
AST: 26 U/L (ref 15–41)
Albumin: 3.5 g/dL (ref 3.5–5.0)
Alkaline Phosphatase: 75 U/L (ref 38–126)
Anion gap: 8 (ref 5–15)
BUN: 7 mg/dL — ABNORMAL LOW (ref 8–23)
CO2: 32 mmol/L (ref 22–32)
Calcium: 8.9 mg/dL (ref 8.9–10.3)
Chloride: 96 mmol/L — ABNORMAL LOW (ref 98–111)
Creatinine, Ser: 0.75 mg/dL (ref 0.44–1.00)
GFR, Estimated: >60 mL/min (ref >60)
Glucose, Bld: 157 mg/dL — ABNORMAL HIGH (ref 70–99)
Potassium: 3.7 mmol/L (ref 3.5–5.1)
Sodium: 136 mmol/L (ref 135–145)
Total Bilirubin: 0.7 mg/dL (ref 0.3–1.2)
Total Protein: 8 g/dL (ref 6.5–8.1)

## 2022-02-27 LAB — URINALYSIS, ROUTINE W REFLEX MICROSCOPIC
Bilirubin Urine: NEGATIVE
Glucose, UA: NEGATIVE mg/dL
Hgb urine dipstick: NEGATIVE
Ketones, ur: NEGATIVE mg/dL
Leukocytes,Ua: NEGATIVE
Nitrite: NEGATIVE
Protein, ur: NEGATIVE mg/dL
Specific Gravity, Urine: 1.005 (ref 1.005–1.030)
pH: 5.5 (ref 5.0–8.0)

## 2022-02-27 LAB — CBC WITH DIFFERENTIAL/PLATELET
Abs Immature Granulocytes: 0.02 10*3/uL (ref 0.00–0.07)
Basophils Absolute: 0 10*3/uL (ref 0.0–0.1)
Basophils Relative: 0 %
Eosinophils Absolute: 0.2 10*3/uL (ref 0.0–0.5)
Eosinophils Relative: 2 %
HCT: 36.3 % (ref 36.0–46.0)
Hemoglobin: 11.1 g/dL — ABNORMAL LOW (ref 12.0–15.0)
Immature Granulocytes: 0 %
Lymphocytes Relative: 17 %
Lymphs Abs: 1.4 10*3/uL (ref 0.7–4.0)
MCH: 25 pg — ABNORMAL LOW (ref 26.0–34.0)
MCHC: 30.6 g/dL (ref 30.0–36.0)
MCV: 81.8 fL (ref 80.0–100.0)
Monocytes Absolute: 0.4 10*3/uL (ref 0.1–1.0)
Monocytes Relative: 5 %
Neutro Abs: 6.3 10*3/uL (ref 1.7–7.7)
Neutrophils Relative %: 76 %
Platelets: 157 10*3/uL (ref 150–400)
RBC: 4.44 MIL/uL (ref 3.87–5.11)
RDW: 14.8 % (ref 11.5–15.5)
WBC: 8.3 10*3/uL (ref 4.0–10.5)
nRBC: 0 % (ref 0.0–0.2)

## 2022-02-27 LAB — LIPASE, BLOOD: Lipase: 17 U/L (ref 11–51)

## 2022-02-27 MED ORDER — IOHEXOL 300 MG/ML  SOLN
100.0000 mL | Freq: Once | INTRAMUSCULAR | Status: AC | PRN
  Administered 2022-02-27: 85 mL via INTRAVENOUS

## 2022-02-27 MED ORDER — CIPROFLOXACIN HCL 500 MG PO TABS
500.0000 mg | ORAL_TABLET | Freq: Once | ORAL | Status: AC
  Administered 2022-02-27: 500 mg via ORAL
  Filled 2022-02-27: qty 1

## 2022-02-27 MED ORDER — SODIUM CHLORIDE 0.9 % IV BOLUS
1000.0000 mL | Freq: Once | INTRAVENOUS | Status: AC
  Administered 2022-02-27: 1000 mL via INTRAVENOUS

## 2022-02-27 MED ORDER — ONDANSETRON HCL 4 MG/2ML IJ SOLN
4.0000 mg | Freq: Once | INTRAMUSCULAR | Status: AC
  Administered 2022-02-27: 4 mg via INTRAVENOUS
  Filled 2022-02-27: qty 2

## 2022-02-27 MED ORDER — METRONIDAZOLE 500 MG PO TABS
500.0000 mg | ORAL_TABLET | Freq: Once | ORAL | Status: AC
  Administered 2022-02-27: 500 mg via ORAL
  Filled 2022-02-27: qty 1

## 2022-02-27 MED ORDER — HYDROMORPHONE HCL 1 MG/ML IJ SOLN
1.0000 mg | Freq: Once | INTRAMUSCULAR | Status: AC
  Administered 2022-02-27: 1 mg via INTRAVENOUS
  Filled 2022-02-27: qty 1

## 2022-02-27 MED ORDER — METRONIDAZOLE 500 MG PO TABS
500.0000 mg | ORAL_TABLET | Freq: Two times a day (BID) | ORAL | 0 refills | Status: DC
  Filled 2022-02-27: qty 14, 7d supply, fill #0

## 2022-02-27 MED ORDER — CIPROFLOXACIN HCL 500 MG PO TABS
500.0000 mg | ORAL_TABLET | Freq: Two times a day (BID) | ORAL | 0 refills | Status: DC
  Filled 2022-02-27: qty 14, 7d supply, fill #0

## 2022-02-27 NOTE — ED Notes (Signed)
RT Note: RT in to see pt. after notified by EMT of low Oxygen saturations in RM #15 of 86-88% per remote monitor, RN/NT at bedside with pt. currently being placed on 2 lpm n/c, RT plans to Assess.

## 2022-02-27 NOTE — ED Triage Notes (Signed)
Patient presents to ED via POV from home. Patient reports lower abdominal pain that starts in the RLQ and radiates to LLQ. Endorses nausea, denies vomiting. Ambulatory.

## 2022-02-27 NOTE — ED Provider Notes (Signed)
[facility] EMERGENC[facility] DEPT Provider Note   CSN: [id number] Arrival date & time: 02/27/22  0840     History  Chief Complaint  Patient presents with   Abdominal Pain    [name] is a 61 y.o. female.  She has a history of chronic abdominal and back pain, cirrhosis with ascites, is on chronic narcotics and follows with Dr. Michail Sermon from [place].  Complaining of 4 days of generalized abdominal pain.  She said it sharp and stabbing starts down in her right lower quadrant and radiates throughout her abdomen.  Nausea no vomiting.  She said she is not passing any gas and is concerned she has a bowel obstruction.  She tried a couple enemas and does not feel like she is constipated.  No fevers chills chest pain.  Her regular oxycodone 15 mg every 4 is not helping her symptoms.  The history is provided by the patient.  Abdominal Pain Pain location:  Generalized Pain quality: stabbing   Pain severity:  Severe Onset quality:  Gradual Duration:  4 days Timing:  Constant Progression:  Unchanged Chronicity:  New Context: not trauma   Relieved by:  Nothing Worsened by:  Movement and palpation Associated symptoms: nausea   Associated symptoms: no chest pain, no cough, no diarrhea, no dysuria, no fever and no vomiting   Risk factors: multiple surgeries        Home Medications Prior to Admission medications   Medication Sig Start Date End Date Taking? Authorizing Provider  acetaminophen (TYLENOL) 500 MG tablet Take 1,000 mg by mouth every 6 (six) hours as needed for moderate pain.    [provider]  albuterol (PROVENTIL HFA;VENTOLIN HFA) 108 (90 BASE) MCG/ACT inhaler Inhale 2 puffs into the lungs every 4 (four) hours as needed for wheezing or shortness of breath.     [provider]  albuterol (PROVENTIL) (2.5 MG/3ML) 0.083% nebulizer solution Take 2.5 mg by nebulization every 6 (six) hours as needed for wheezing or shortness of breath.    [provider]  ALPRAZolam Duanne Moron) 1 MG tablet Take 1-2 mg by mouth See admin instructions. 1 tablet in the morning 2 tablets at bedtime    [provider]  amLODipine (NORVASC) 5 MG tablet TAKE 1 TABLET BY MOUTH IN THE MORNING AND AT BEDTIME 04/16/21   Bhagat, Bhavinkumar, PA  benazepril (LOTENSIN) 40 MG tablet Take 40 mg by mouth daily.     [provider]  Benzocaine-Resorcinol (VAGISIL EX) Apply 1 application topically daily.    [provider]  cefdinir (OMNICEF) 300 MG capsule Take 1 capsule (300 mg total) by mouth every 12 (twelve) hours. 03/28/21   [name], MD  cloNIDine (CATAPRES) 0.1 MG tablet Take 0.2 mg by mouth 2 (two) times daily as needed (blood pressure). 02/05/21   [provider]  docusate sodium (COLACE) 250 MG capsule Take 250 mg by mouth every evening.    [provider]  escitalopram (LEXAPRO) 20 MG tablet Take 20 mg by mouth every evening.  04/06/17   [provider]  estradiol (ESTRACE) 2 MG tablet Take 2 mg by mouth daily.     [provider]  fluticasone (FLONASE) 50 MCG/ACT nasal spray Place 1 spray into both nostrils daily as needed for allergies.    [provider]  Glycerin-Polysorbate 80 (REFRESH DRY EYE THERAPY OP) Place 1 drop into both eyes daily as needed (dry eyes).    [provider]  hydrOXYzine (ATARAX/VISTARIL) 25  MG tablet Take 25 mg by mouth every 8 (eight) hours as needed for anxiety.    [provider]  levocetirizine (XYZAL) 5 MG tablet Take 5 mg by mouth every evening.  09/16/19   [provider]  lidocaine (XYLOCAINE) 5 % ointment Apply 1 application topically 2 (two) times daily as needed (hemorrhoids).    [provider]  meloxicam (MOBIC) 15 MG tablet Take 15 mg by mouth daily as needed for pain.  09/16/19   [provider]  metFORMIN (GLUCOPHAGE) 500 MG tablet Take 2 tablets (1,000 mg total) by mouth 2 (two) times daily with a  meal. Patient taking differently: Take 500 mg by mouth every evening. 12/13/14   Thurnell Lose, MD  metroNIDAZOLE (FLAGYL) 500 MG tablet Take 1 tablet (500 mg total) by mouth every 12 (twelve) hours. 03/28/21   [name], MD  naloxone [facility]) 4 MG/0.1ML LIQD nasal spray kit Place 1 spray into the nose as needed (for overdose).     [provider]  naproxen sodium (ALEVE) 220 MG tablet Take 440 mg by mouth daily as needed (pain).    [provider]  nystatin (MYCOSTATIN/NYSTOP) powder Apply 1 g topically daily as needed (irritation. (Typically during Summer months)).     [provider]  ondansetron (ZOFRAN) 4 MG tablet Take 1 tablet (4 mg total) by mouth every 8 (eight) hours as needed for nausea or vomiting. 03/15/20   Isla Pence, MD  oxyCODONE (ROXICODONE) 15 MG immediate release tablet Take 1 tablet (15 mg total) by mouth every 8 (eight) hours as needed for pain. 03/29/21   [name], MD  pantoprazole (PROTONIX) 40 MG tablet Take 40 mg by mouth 2 (two) times daily.     [provider]  PHAZYME MAXIMUM STRENGTH 250 MG CAPS Take 250 mg by mouth 2 (two) times daily as needed (gas relief).    [provider]  polyethylene glycol (MIRALAX / GLYCOLAX) 17 g packet Take 17 g by mouth daily as needed for moderate constipation. 03/28/21   [name], MD  psyllium (METAMUCIL) 58.6 % packet Take 1 packet by mouth daily as needed (regularity).     [provider]  simvastatin (ZOCOR) 20 MG tablet Take 20 mg by mouth at bedtime.    [provider]  SYMBICORT 160-4.5 MCG/ACT inhaler Inhale 2 puffs into the lungs in the morning and at bedtime.  02/09/20   [provider]  torsemide (DEMADEX) 20 MG tablet Take 40 mg by mouth daily as needed (fluid).  12/30/18   [provider]  traZODone (DESYREL) 50 MG tablet Take 50-100 mg by mouth at bedtime. 01/15/21   [provider]  triamcinolone cream  (KENALOG) 0.1 % Apply 1 application topically 2 (two) times daily as needed (rash).    [provider]      Allergies    Spironolactone, Gabapentin, Sulfamethoxazole-trimethoprim, Amoxicillin, Clindamycin/lincomycin, Doxycycline, Treximet [sumatriptan-naproxen sodium], and Bactrim    Review of Systems   Review of Systems  Constitutional:  Negative for fever.  Respiratory:  Negative for cough.   Cardiovascular:  Negative for chest pain.  Gastrointestinal:  Positive for abdominal pain and nausea. Negative for diarrhea and vomiting.  Genitourinary:  Negative for dysuria.    Physical Exam Updated Vital Signs BP (!) 132/54   Pulse 94   Temp 98.6 F (37 C) (Oral)   Resp 20   Wt 106.5 kg   SpO2 91%   BMI 45.86 kg/m  Physical Exam Vitals and nursing note reviewed.  Constitutional:      General: She is not in acute distress.    Appearance: She is well-developed. She is obese.  HENT:     Head: Normocephalic and atraumatic.  Eyes:     Conjunctiva/sclera: Conjunctivae normal.  Cardiovascular:     Rate and Rhythm: Normal rate and regular rhythm.     Heart sounds: No murmur heard. Pulmonary:     Effort: Pulmonary effort is normal. No respiratory distress.     Breath sounds: Normal breath sounds.  Abdominal:     General: There is distension.     Tenderness: There is generalized abdominal tenderness. There is no guarding or rebound.  Musculoskeletal:     Cervical back: Neck supple.     Right lower leg: No edema.     Left lower leg: No edema.  Skin:    General: Skin is warm and dry.     Capillary Refill: Capillary refill takes less than 2 seconds.  Neurological:     General: No focal deficit present.     Mental Status: She is alert.     ED Results / Procedures / Treatments   Labs (all labs ordered are listed, but only abnormal results are displayed) Labs Reviewed  COMPREHENSIVE METABOLIC PANEL - Abnormal; Notable for the following components:      Result Value    Chloride 96 (*)    Glucose, Bld 157 (*)    BUN 7 (*)    All other components within normal limits  CBC WITH DIFFERENTIAL/PLATELET - Abnormal; Notable for the following components:   Hemoglobin 11.1 (*)    MCH 25.0 (*)    All other components within normal limits  LIPASE, BLOOD  URINALYSIS, ROUTINE W REFLEX MICROSCOPIC    EKG None  Radiology CT Abdomen Pelvis W Contrast  Result Date: 02/27/2022 CLINICAL DATA:  Abdominal pain, acute, nonlocalized EXAM: CT ABDOMEN AND PELVIS WITH CONTRAST TECHNIQUE: Multidetector CT imaging of the abdomen and pelvis was performed using the standard protocol following bolus administration of intravenous contrast. RADIATION DOSE REDUCTION: This exam was performed according to the departmental dose-optimization program which includes automated exposure control, adjustment of the mA and/or kV according to patient size and/or use of iterative reconstruction technique. CONTRAST:  56m OMNIPAQUE IOHEXOL 300 MG/ML  SOLN COMPARISON:  Comparison made with the CT of the abdomen and pelvis dated March 26, 2021 FINDINGS: Lower chest: No acute abnormality. Hepatobiliary: No focal liver abnormality is seen. Hepatic surface appears nodular and lobulated. No gallstones, gallbladder wall thickening, or biliary dilatation. Pancreas: Unremarkable. No pancreatic ductal dilatation or surrounding inflammatory changes. Spleen: Normal in size without focal abnormality. There are couple of hypodense lesions likely cysts seen in the upper aspect of the spleen with larger cyst measuring 1.6 cm, stable. Adrenals/Urinary Tract: Adrenal glands are unremarkable. Kidneys are normal, without renal calculi, focal lesion, or hydronephrosis. Bladder is unremarkable. 1.1 cm cyst in the lower pole of the right kidney. Stomach/Bowel: There is mild circumferential thickening of the distal esophagus seen. Appendix is not well seen. Bowel-gas pattern is nonobstructive. There is an apparent thickening of the  distal sigmoid colon (images 67 through 71 of series 2). No significant pericolonic stranding. No drainable abscess. Moderate stool burden in the colon. Vascular/Lymphatic: No significant vascular findings are present. No enlarged abdominal or pelvic lymph nodes. Reproductive: Status post hysterectomy. No adnexal masses. Other: There is mild-to-moderate ascites seen confined to the upper aspect of the abdomen. There is  mild-to-moderate ascites seen confined to the upper abdomen and has minimally increased in the interim. Musculoskeletal: Mild anterolisthesis of L5 over S1. Moderate thoracolumbar spondylosis with prominent anterior marginal osteophytes. Osteophytes are more prominent in the visualized lower thoracic vertebrae. IMPRESSION: Small to moderate ascites and has minimally increased in the interim. Ascites may be due to cirrhosis of the liver as the hepatic surface looks nodular and lobulated. There is an apparent thickening of the distal sigmoid colon as above without pericolonic stranding and is suspicious for diverticulitis, less likely neoplasm. Moderate stool burden in the colon. Electronically Signed   By: Frazier Richards M.D.   On: 02/27/2022 11:16    Procedures Procedures    Medications Ordered in ED Medications  sodium chloride 0.9 % bolus 1,000 mL (0 mLs Intravenous Stopped 02/27/22 0930)  ondansetron (ZOFRAN) injection 4 mg (4 mg Intravenous Given 02/27/22 0928)  HYDROmorphone (DILAUDID) injection 1 mg (1 mg Intravenous Given 02/27/22 0928)  iohexol (OMNIPAQUE) 300 MG/ML solution 100 mL (85 mLs Intravenous Contrast Given 02/27/22 1032)  HYDROmorphone (DILAUDID) injection 1 mg (1 mg Intravenous Given 02/27/22 1205)  ciprofloxacin (CIPRO) tablet 500 mg (500 mg Oral Given 02/27/22 1204)  metroNIDAZOLE (FLAGYL) tablet 500 mg (500 mg Oral Given 02/27/22 1204)    ED Course/ Medical Decision Making/ A&P                           Medical Decision Making Amount and/or Complexity of Data  Reviewed Labs: ordered. Radiology: ordered.  Risk Prescription drug management.  This patient complains of generalized abdominal pain decreased stooling, nausea; this involves an extensive number of treatment Options and is a complaint that carries with it a high risk of complications and morbidity. The differential includes acute on chronic abdominal pain, constipation, obstruction, ileus diverticulitis, colitis  I ordered, reviewed and interpreted labs, which included CBC with normal white count, hemoglobin slightly lower than priors, chemistries fairly normal elevated glucose, normal LFTs, negative urinalysis I ordered medication IV fluids IV pain medication, nausea medication, oral antibiotics and reviewed PMP when indicated. I ordered imaging studies which included CT abdomen and pelvis with contrast and I independently    visualized and interpreted imaging which showed ascites, acute diverticulitis Previous records obtained and reviewed in epic including recent PCP visits Cardiac monitoring reviewed, normal sinus rhythm Social determinants considered, ongoing tobacco use Critical Interventions: None  After the interventions stated above, I reevaluated the patient and found patient's pain to be improved Admission and further testing considered, no indications for admission at this time.  Will treat with antibiotics and recommend close follow-up with her primary care doctor and her GI team.  Return instructions discussed          Final Clinical Impression(s) / ED Diagnoses Final diagnoses:  Acute diverticulitis    Rx / DC Orders ED Discharge Orders          Ordered    ciprofloxacin (CIPRO) 500 MG tablet  2 times daily        02/27/22 1223    metroNIDAZOLE (FLAGYL) 500 MG tablet  2 times daily        02/27/22 1223              Hayden Rasmussen, MD 02/27/22 1743

## 2022-02-27 NOTE — ED Notes (Signed)
Discharge instructions, follow up care, and prescriptions reviewed and explained, pt verbalized understanding.  

## 2022-02-27 NOTE — Discharge Instructions (Addendum)
You were seen in the emergency department for lower abdominal pain.  You had blood work and a CAT scan that showed acute diverticulitis.  This is treated with antibiotics.  Please start with a liquid diet and advance as tolerated.  Follow-up with your primary care doctor and GI team.  Return to the emergency department if any worsening or concerning symptoms.  Please do not drink alcohol while using the antibiotics.

## 2022-03-17 DIAGNOSIS — Z79899 Other long term (current) drug therapy: Secondary | ICD-10-CM | POA: Diagnosis not present

## 2022-03-17 DIAGNOSIS — R892 Abnormal level of other drugs, medicaments and biological substances in specimens from other organs, systems and tissues: Secondary | ICD-10-CM | POA: Diagnosis not present

## 2022-03-17 DIAGNOSIS — Z0130 Encounter for examination of blood pressure without abnormal findings: Secondary | ICD-10-CM | POA: Diagnosis not present

## 2022-03-17 DIAGNOSIS — K7460 Unspecified cirrhosis of liver: Secondary | ICD-10-CM | POA: Diagnosis not present

## 2022-03-22 ENCOUNTER — Emergency Department (HOSPITAL_BASED_OUTPATIENT_CLINIC_OR_DEPARTMENT_OTHER)
Admission: EM | Admit: 2022-03-22 | Discharge: 2022-03-22 | Disposition: A | Discharge status: Home / Self Care | Payer: Medicare Other | Attending: Emergency Medicine | Admitting: Emergency Medicine

## 2022-03-22 ENCOUNTER — Emergency Department (HOSPITAL_BASED_OUTPATIENT_CLINIC_OR_DEPARTMENT_OTHER): Payer: Medicare Other

## 2022-03-22 ENCOUNTER — Other Ambulatory Visit: Payer: Self-pay

## 2022-03-22 ENCOUNTER — Encounter (HOSPITAL_BASED_OUTPATIENT_CLINIC_OR_DEPARTMENT_OTHER): Payer: Self-pay | Admitting: Emergency Medicine

## 2022-03-22 DIAGNOSIS — R112 Nausea with vomiting, unspecified: Secondary | ICD-10-CM | POA: Insufficient documentation

## 2022-03-22 DIAGNOSIS — R892 Abnormal level of other drugs, medicaments and biological substances in specimens from other organs, systems and tissues: Secondary | ICD-10-CM | POA: Diagnosis not present

## 2022-03-22 DIAGNOSIS — Z7984 Long term (current) use of oral hypoglycemic drugs: Secondary | ICD-10-CM | POA: Diagnosis not present

## 2022-03-22 DIAGNOSIS — K769 Liver disease, unspecified: Secondary | ICD-10-CM | POA: Diagnosis not present

## 2022-03-22 DIAGNOSIS — R1030 Lower abdominal pain, unspecified: Secondary | ICD-10-CM | POA: Diagnosis not present

## 2022-03-22 DIAGNOSIS — Z79899 Other long term (current) drug therapy: Secondary | ICD-10-CM | POA: Diagnosis not present

## 2022-03-22 DIAGNOSIS — E119 Type 2 diabetes mellitus without complications: Secondary | ICD-10-CM | POA: Insufficient documentation

## 2022-03-22 DIAGNOSIS — I10 Essential (primary) hypertension: Secondary | ICD-10-CM | POA: Diagnosis not present

## 2022-03-22 DIAGNOSIS — K7460 Unspecified cirrhosis of liver: Secondary | ICD-10-CM | POA: Diagnosis not present

## 2022-03-22 DIAGNOSIS — Z0130 Encounter for examination of blood pressure without abnormal findings: Secondary | ICD-10-CM | POA: Diagnosis not present

## 2022-03-22 DIAGNOSIS — N281 Cyst of kidney, acquired: Secondary | ICD-10-CM | POA: Diagnosis not present

## 2022-03-22 DIAGNOSIS — R030 Elevated blood-pressure reading, without diagnosis of hypertension: Secondary | ICD-10-CM | POA: Diagnosis not present

## 2022-03-22 DIAGNOSIS — D72829 Elevated white blood cell count, unspecified: Secondary | ICD-10-CM | POA: Diagnosis not present

## 2022-03-22 LAB — CBC
HCT: 44.1 % (ref 36.0–46.0)
Hemoglobin: 14 g/dL (ref 12.0–15.0)
MCH: 25.5 pg — ABNORMAL LOW (ref 26.0–34.0)
MCHC: 31.7 g/dL (ref 30.0–36.0)
MCV: 80.3 fL (ref 80.0–100.0)
Platelets: 246 10*3/uL (ref 150–400)
RBC: 5.49 MIL/uL — ABNORMAL HIGH (ref 3.87–5.11)
RDW: 15.6 % — ABNORMAL HIGH (ref 11.5–15.5)
WBC: 13.7 10*3/uL — ABNORMAL HIGH (ref 4.0–10.5)
nRBC: 0 % (ref 0.0–0.2)

## 2022-03-22 LAB — COMPREHENSIVE METABOLIC PANEL
ALT: 18 U/L (ref 0–44)
AST: 27 U/L (ref 15–41)
Albumin: 3.7 g/dL (ref 3.5–5.0)
Alkaline Phosphatase: 77 U/L (ref 38–126)
Anion gap: 11 (ref 5–15)
BUN: 5 mg/dL — ABNORMAL LOW (ref 8–23)
CO2: 25 mmol/L (ref 22–32)
Calcium: 8.9 mg/dL (ref 8.9–10.3)
Chloride: 100 mmol/L (ref 98–111)
Creatinine, Ser: 0.8 mg/dL (ref 0.44–1.00)
GFR, Estimated: >60 mL/min (ref >60)
Glucose, Bld: 213 mg/dL — ABNORMAL HIGH (ref 70–99)
Potassium: 3.9 mmol/L (ref 3.5–5.1)
Sodium: 136 mmol/L (ref 135–145)
Total Bilirubin: 0.6 mg/dL (ref 0.3–1.2)
Total Protein: 8.4 g/dL — ABNORMAL HIGH (ref 6.5–8.1)

## 2022-03-22 LAB — URINALYSIS, ROUTINE W REFLEX MICROSCOPIC
Bilirubin Urine: NEGATIVE
Glucose, UA: NEGATIVE mg/dL
Hgb urine dipstick: NEGATIVE
Ketones, ur: NEGATIVE mg/dL
Leukocytes,Ua: NEGATIVE
Nitrite: NEGATIVE
Protein, ur: 30 mg/dL — AB
Specific Gravity, Urine: 1.026 (ref 1.005–1.030)
pH: 5.5 (ref 5.0–8.0)

## 2022-03-22 LAB — LIPASE, BLOOD: Lipase: 30 U/L (ref 11–51)

## 2022-03-22 MED ORDER — SODIUM CHLORIDE 0.9 % IV BOLUS
500.0000 mL | Freq: Once | INTRAVENOUS | Status: AC
  Administered 2022-03-22: 500 mL via INTRAVENOUS

## 2022-03-22 MED ORDER — IOHEXOL 300 MG/ML  SOLN
100.0000 mL | Freq: Once | INTRAMUSCULAR | Status: AC | PRN
  Administered 2022-03-22: 100 mL via INTRAVENOUS

## 2022-03-22 MED ORDER — ONDANSETRON HCL 4 MG/2ML IJ SOLN
4.0000 mg | Freq: Once | INTRAMUSCULAR | Status: AC
  Administered 2022-03-22: 4 mg via INTRAVENOUS
  Filled 2022-03-22: qty 2

## 2022-03-22 MED ORDER — FENTANYL CITRATE PF 50 MCG/ML IJ SOSY
100.0000 ug | PREFILLED_SYRINGE | Freq: Once | INTRAMUSCULAR | Status: AC
  Administered 2022-03-22: 100 ug via INTRAVENOUS
  Filled 2022-03-22: qty 2

## 2022-03-22 NOTE — Discharge Instructions (Signed)
The results in the emergency room revealed normal-appearing CT scan with some ascites. The blood work is otherwise reassuring and shows no organ damage or severe electrolyte abnormality.  It is not entirely clear why you are having this pain.  We recommend continued good hydration along with simple diet for now.  Return to the emergency room if you start having worsening pain, severe nausea and vomiting, high fevers or chills.

## 2022-03-22 NOTE — ED Triage Notes (Signed)
Pt c/o lower abd pain since 2330 yesterday, emesis starting this AM. Denies diarrhea.   Pt reports not taking BP meds today. Took phenergan and zofran today.

## 2022-03-22 NOTE — ED Notes (Signed)
Patient transported to CT 

## 2022-03-22 NOTE — ED Notes (Signed)
Discharge paperwork given and understood. Pt was instructed to wait for her husband to drive her home before (which is about 10 mins away) leaving the ED due to being given Fent about 15 mins prior.

## 2022-03-22 NOTE — ED Provider Notes (Signed)
[facility] EMERGENCY DEPT Provider Note   CSN: [id number] Arrival date & time: 03/22/22  1645     History  Chief Complaint  Patient presents with   Abdominal Pain    [name] is a 61 y.o. female.  HPI     61 year old female with history of liver cirrhosis secondary to [name], recent episode of diverticulitis comes in with chief complaint of abdominal pain. Patient also has history of colitis, diabetes and chronic abdominal pain.  Patient indicates that her pharmacy ran out of her chronic pain meds, therefore her chronic abdominal pain has been bothering her.  She started taking her oxycodone today.  She however is also having lower quadrant abdominal pain that is sharp.  That pain started last night and she has associated nausea and vomiting without diarrhea.  Pain is constant and not similar to her chronic abdominal pain.  She had diverticulitis in June, this pain does appear slightly similar to that.  She has moderate ascites history but no previous history of peritonitis or ascitic fluid drainage.  Patient feels dehydrated.  She denies any fevers, chills  Home Medications Prior to Admission medications   Medication Sig Start Date End Date Taking? Authorizing Provider  acetaminophen (TYLENOL) 500 MG tablet Take 1,000 mg by mouth every 6 (six) hours as needed for moderate pain.    [provider]  albuterol (PROVENTIL HFA;VENTOLIN HFA) 108 (90 BASE) MCG/ACT inhaler Inhale 2 puffs into the lungs every 4 (four) hours as needed for wheezing or shortness of breath.     [provider]  albuterol (PROVENTIL) (2.5 MG/3ML) 0.083% nebulizer solution Take 2.5 mg by nebulization every 6 (six) hours as needed for wheezing or shortness of breath.    [provider]  ALPRAZolam Duanne Moron) 1 MG tablet Take 1-2 mg by mouth See admin instructions. 1 tablet in the morning 2 tablets at bedtime    [provider]  amLODipine (NORVASC) 5 MG tablet  TAKE 1 TABLET BY MOUTH IN THE MORNING AND AT BEDTIME 04/16/21   Bhagat, Bhavinkumar, PA  benazepril (LOTENSIN) 40 MG tablet Take 40 mg by mouth daily.     [provider]  Benzocaine-Resorcinol (VAGISIL EX) Apply 1 application topically daily.    [provider]  cefdinir (OMNICEF) 300 MG capsule Take 1 capsule (300 mg total) by mouth every 12 (twelve) hours. Patient not taking: Reported on 02/27/2022 03/28/21   [name], MD  ciprofloxacin (CIPRO) 500 MG tablet Take 1 tablet (500 mg total) by mouth 2 (two) times daily. 02/27/22   Hayden Rasmussen, MD  cloNIDine (CATAPRES) 0.1 MG tablet Take 0.2 mg by mouth 2 (two) times daily as needed (blood pressure). 02/05/21   [provider]  docusate sodium (COLACE) 250 MG capsule Take 250 mg by mouth every evening.    [provider]  escitalopram (LEXAPRO) 20 MG tablet Take 20 mg by mouth every evening.  04/06/17   [provider]  estradiol (ESTRACE) 2 MG tablet Take 2 mg by mouth daily.     [provider]  fluticasone (FLONASE) 50 MCG/ACT nasal spray Place 1 spray into both nostrils daily as needed for allergies.    [provider]  Glycerin-Polysorbate 80 (REFRESH DRY EYE THERAPY OP) Place 1 drop into both eyes daily as needed (dry eyes).    [provider]  hydrOXYzine (ATARAX/VISTARIL) 25 MG tablet Take 25 mg by mouth every 8 (eight) hours as needed for anxiety.  [provider]  levocetirizine (XYZAL) 5 MG tablet Take 5 mg by mouth every evening.  09/16/19   [provider]  lidocaine (XYLOCAINE) 5 % ointment Apply 1 application topically 2 (two) times daily as needed (hemorrhoids).    [provider]  meloxicam (MOBIC) 15 MG tablet Take 15 mg by mouth daily as needed for pain.  09/16/19   [provider]  metFORMIN (GLUCOPHAGE) 500 MG tablet Take 2 tablets (1,000 mg total) by mouth 2 (two) times daily with a meal. Patient taking differently:  Take 500 mg by mouth every evening. 12/13/14   Thurnell Lose, MD  metroNIDAZOLE (FLAGYL) 500 MG tablet Take 1 tablet (500 mg total) by mouth 2 (two) times daily. 02/27/22   Hayden Rasmussen, MD  naloxone [facility]) 4 MG/0.1ML LIQD nasal spray kit Place 1 spray into the nose as needed (for overdose).     [provider]  naproxen sodium (ALEVE) 220 MG tablet Take 440 mg by mouth daily as needed (pain).    [provider]  nystatin (MYCOSTATIN/NYSTOP) powder Apply 1 g topically daily as needed (irritation. (Typically during Summer months)).     [provider]  ondansetron (ZOFRAN) 4 MG tablet Take 1 tablet (4 mg total) by mouth every 8 (eight) hours as needed for nausea or vomiting. 03/15/20   Isla Pence, MD  oxyCODONE (ROXICODONE) 15 MG immediate release tablet Take 1 tablet (15 mg total) by mouth every 8 (eight) hours as needed for pain. 03/29/21   [name], MD  pantoprazole (PROTONIX) 40 MG tablet Take 40 mg by mouth 2 (two) times daily.     [provider]  PHAZYME MAXIMUM STRENGTH 250 MG CAPS Take 250 mg by mouth 2 (two) times daily as needed (gas relief).    [provider]  polyethylene glycol (MIRALAX / GLYCOLAX) 17 g packet Take 17 g by mouth daily as needed for moderate constipation. 03/28/21   [name], MD  psyllium (METAMUCIL) 58.6 % packet Take 1 packet by mouth daily as needed (regularity).     [provider]  simvastatin (ZOCOR) 20 MG tablet Take 20 mg by mouth at bedtime.    [provider]  SYMBICORT 160-4.5 MCG/ACT inhaler Inhale 2 puffs into the lungs in the morning and at bedtime.  02/09/20   [provider]  torsemide (DEMADEX) 20 MG tablet Take 40 mg by mouth daily as needed (fluid).  12/30/18   [provider]  traZODone (DESYREL) 50 MG tablet Take 50-100 mg by mouth at bedtime. 01/15/21   [provider]  triamcinolone cream (KENALOG) 0.1 % Apply 1 application topically 2  (two) times daily as needed (rash).    [provider]      Allergies    Spironolactone, Gabapentin, Sulfamethoxazole-trimethoprim, Amoxicillin, Clindamycin/lincomycin, Doxycycline, Treximet [sumatriptan-naproxen sodium], and Bactrim    Review of Systems   Review of Systems  All other systems reviewed and are negative.   Physical Exam Updated Vital Signs BP 121/76   Pulse 92   Temp (!) 97.4 F (36.3 C)   Resp 18   Ht _0  (1.549 m)   Wt 106.1 kg   SpO2 95%   BMI 44.21 kg/m  Physical Exam Vitals and nursing note reviewed.  Constitutional:      Appearance: She is well-developed.  HENT:     Head: Atraumatic.  Cardiovascular:     Rate and Rhythm: Normal rate.  Pulmonary:     Effort:  Pulmonary effort is normal.  Abdominal:     Palpations: Abdomen is soft. There is fluid wave.     Tenderness: There is abdominal tenderness. There is no guarding or rebound.     Comments: Lower quadrant tenderness bilaterally  Musculoskeletal:     Cervical back: Normal range of motion and neck supple.  Skin:    General: Skin is warm and dry.  Neurological:     Mental Status: She is alert and oriented to person, place, and time.     ED Results / Procedures / Treatments   Labs (all labs ordered are listed, but only abnormal results are displayed) Labs Reviewed  COMPREHENSIVE METABOLIC PANEL - Abnormal; Notable for the following components:      Result Value   Glucose, Bld 213 (*)    BUN 5 (*)    Total Protein 8.4 (*)    All other components within normal limits  CBC - Abnormal; Notable for the following components:   WBC 13.7 (*)    RBC 5.49 (*)    MCH 25.5 (*)    RDW 15.6 (*)    All other components within normal limits  URINALYSIS, ROUTINE W REFLEX MICROSCOPIC - Abnormal; Notable for the following components:   Protein, ur 30 (*)    Bacteria, UA RARE (*)    All other components within normal limits  LIPASE, BLOOD    EKG None  Radiology CT ABDOMEN PELVIS W  CONTRAST  Result Date: 03/22/2022 CLINICAL DATA:  Abdominal pain, acute, nonlocalized lower abdominal pain EXAM: CT ABDOMEN AND PELVIS WITH CONTRAST TECHNIQUE: Multidetector CT imaging of the abdomen and pelvis was performed using the standard protocol following bolus administration of intravenous contrast. RADIATION DOSE REDUCTION: This exam was performed according to the departmental dose-optimization program which includes automated exposure control, adjustment of the mA and/or kV according to patient size and/or use of iterative reconstruction technique. CONTRAST:  166m OMNIPAQUE IOHEXOL 300 MG/ML  SOLN COMPARISON:  02/27/2022 FINDINGS: Lower chest: No acute abnormality Hepatobiliary: Cirrhotic liver. No focal hepatic abnormality. Gallbladder unremarkable. Pancreas: No focal abnormality or ductal dilatation. Spleen: No focal abnormality.  Normal size. Adrenals/Urinary Tract: Stable 10 mm cyst in the lower pole of the right kidney. No follow-up imaging recommended. No stones or hydronephrosis. Adrenal glands and urinary bladder unremarkable. Stomach/Bowel: Stomach, large and small bowel grossly unremarkable. Vascular/Lymphatic: Aortic atherosclerosis. No evidence of aneurysm or adenopathy. Reproductive: Prior hysterectomy.  No adnexal masses. Other: Moderate ascites in the abdomen and pelvis. Musculoskeletal: No acute bony abnormality. IMPRESSION: Cirrhosis.  Associated moderate ascites. No acute findings in the abdomen or pelvis. Aortic atherosclerosis. Electronically Signed   By: KRolm BaptiseM.D.   On: 03/22/2022 19:18    Procedures Procedures    Medications Ordered in ED Medications  fentaNYL (SUBLIMAZE) injection 100 mcg (has no administration in time range)  fentaNYL (SUBLIMAZE) injection 100 mcg (100 mcg Intravenous Given 03/22/22 1856)  ondansetron (ZOFRAN) injection 4 mg (4 mg Intravenous Given 03/22/22 1858)  sodium chloride 0.9 % bolus 500 mL (500 mLs Intravenous New Bag/Given 03/22/22 1855)   iohexol (OMNIPAQUE) 300 MG/ML solution 100 mL (100 mLs Intravenous Contrast Given 03/22/22 1904)    ED Course/ Medical Decision Making/ A&P                           Medical Decision Making Amount and/or Complexity of Data Reviewed Labs: ordered. Radiology: ordered.  Risk Prescription drug management.   This patient presents  to the ED with chief complaint(s) of abdominal pain with pertinent past medical history of diabetes, diverticulitis recently, NASH cirrhosis with chronic ascites which further complicates the presenting complaint. The complaint involves an extensive differential diagnosis and also carries with it a high risk of complications and morbidity.    The differential diagnosis includes : Acute diverticulitis, ileus, colitis, spontaneous bacterial peritonitis. Patient also indicates having nausea and vomiting, therefore differential also includes severe electrolyte abnormality, AKI.  The initial plan is to order basic blood work and repeat a CT scan.  We will also check urine.   Additional history obtained: Records reviewed previous admission documents and previous CT scan that revealed evidence of colitis versus diverticulitis  Independent labs interpretation:  The following labs were independently interpreted: Patient's lab work-up is reassuring.  She has mild leukocytosis, no severe electrolyte abnormality or renal failure.  UA shows no concerning findings  Independent visualization of imaging: - I independently visualized the following imaging with scope of interpretation limited to determining acute life threatening conditions related to emergency care: CT scan of the abdomen and pelvis, which revealed no evidence of perforation  Treatment and Reassessment: Patient received IV fentanyl.  She reported she feels better.  On repeat abdominal exam there is no changes.  She has not had any nausea or vomiting since being roomed.  She indicates that she has multiple nausea  medications at home including rectal suppositories.  UA did not show any ketones or show evidence of high specific gravity, which is reassuring.  Based on my repeat assessment, I do not think we need to do a paracentesis to rule out spontaneous bacterial peritonitis.  Shared this judgment with the patient, and she actually agrees.  The patient appears reasonably screened and/or stabilized for discharge and I doubt any other medical condition or other [facility] requiring further screening, evaluation, or treatment in the ED at this time prior to discharge.   Results from the ER workup discussed with the patient face to face and all questions answered to the best of my ability. The patient is safe for discharge with strict return precautions.   Final Clinical Impression(s) / ED Diagnoses Final diagnoses:  Lower abdominal pain    Rx / DC Orders ED Discharge Orders     None         Varney Biles, MD 03/22/22 2116

## 2022-04-14 DIAGNOSIS — M545 Low back pain, unspecified: Secondary | ICD-10-CM | POA: Diagnosis not present

## 2022-04-14 DIAGNOSIS — K7460 Unspecified cirrhosis of liver: Secondary | ICD-10-CM | POA: Diagnosis not present

## 2022-04-14 DIAGNOSIS — R109 Unspecified abdominal pain: Secondary | ICD-10-CM | POA: Diagnosis not present

## 2022-04-14 DIAGNOSIS — Z79899 Other long term (current) drug therapy: Secondary | ICD-10-CM | POA: Diagnosis not present

## 2022-04-14 DIAGNOSIS — M542 Cervicalgia: Secondary | ICD-10-CM | POA: Diagnosis not present

## 2022-04-16 DIAGNOSIS — Z79899 Other long term (current) drug therapy: Secondary | ICD-10-CM | POA: Diagnosis not present

## 2022-05-14 DIAGNOSIS — Z79899 Other long term (current) drug therapy: Secondary | ICD-10-CM | POA: Diagnosis not present

## 2022-05-14 DIAGNOSIS — M542 Cervicalgia: Secondary | ICD-10-CM | POA: Diagnosis not present

## 2022-05-14 DIAGNOSIS — G8929 Other chronic pain: Secondary | ICD-10-CM | POA: Diagnosis not present

## 2022-05-14 DIAGNOSIS — R109 Unspecified abdominal pain: Secondary | ICD-10-CM | POA: Diagnosis not present

## 2022-05-14 DIAGNOSIS — M545 Low back pain, unspecified: Secondary | ICD-10-CM | POA: Diagnosis not present

## 2022-05-14 DIAGNOSIS — K7460 Unspecified cirrhosis of liver: Secondary | ICD-10-CM | POA: Diagnosis not present

## 2022-05-30 ENCOUNTER — Encounter (HOSPITAL_BASED_OUTPATIENT_CLINIC_OR_DEPARTMENT_OTHER): Payer: Self-pay

## 2022-05-30 ENCOUNTER — Other Ambulatory Visit: Payer: Self-pay

## 2022-05-30 ENCOUNTER — Encounter (HOSPITAL_COMMUNITY): Payer: Self-pay

## 2022-05-30 ENCOUNTER — Observation Stay (HOSPITAL_COMMUNITY): Payer: Medicare Other

## 2022-05-30 ENCOUNTER — Emergency Department (HOSPITAL_BASED_OUTPATIENT_CLINIC_OR_DEPARTMENT_OTHER): Payer: Medicare Other

## 2022-05-30 ENCOUNTER — Observation Stay (HOSPITAL_BASED_OUTPATIENT_CLINIC_OR_DEPARTMENT_OTHER)
Admission: EM | Admit: 2022-05-30 | Discharge: 2022-06-02 | Disposition: A | Discharge status: Home / Self Care | Payer: Medicare Other | Attending: Internal Medicine | Admitting: Internal Medicine

## 2022-05-30 DIAGNOSIS — F1190 Opioid use, unspecified, uncomplicated: Secondary | ICD-10-CM | POA: Diagnosis present

## 2022-05-30 DIAGNOSIS — R109 Unspecified abdominal pain: Secondary | ICD-10-CM | POA: Diagnosis not present

## 2022-05-30 DIAGNOSIS — K7460 Unspecified cirrhosis of liver: Secondary | ICD-10-CM | POA: Diagnosis not present

## 2022-05-30 DIAGNOSIS — I152 Hypertension secondary to endocrine disorders: Secondary | ICD-10-CM | POA: Diagnosis present

## 2022-05-30 DIAGNOSIS — R1110 Vomiting, unspecified: Secondary | ICD-10-CM | POA: Diagnosis not present

## 2022-05-30 DIAGNOSIS — R188 Other ascites: Secondary | ICD-10-CM | POA: Diagnosis not present

## 2022-05-30 DIAGNOSIS — Z7984 Long term (current) use of oral hypoglycemic drugs: Secondary | ICD-10-CM | POA: Insufficient documentation

## 2022-05-30 DIAGNOSIS — E1169 Type 2 diabetes mellitus with other specified complication: Secondary | ICD-10-CM | POA: Diagnosis present

## 2022-05-30 DIAGNOSIS — Z20822 Contact with and (suspected) exposure to covid-19: Secondary | ICD-10-CM | POA: Diagnosis not present

## 2022-05-30 DIAGNOSIS — R0789 Other chest pain: Secondary | ICD-10-CM | POA: Diagnosis not present

## 2022-05-30 DIAGNOSIS — R9431 Abnormal electrocardiogram [ECG] [EKG]: Secondary | ICD-10-CM | POA: Insufficient documentation

## 2022-05-30 DIAGNOSIS — K760 Fatty (change of) liver, not elsewhere classified: Secondary | ICD-10-CM | POA: Diagnosis present

## 2022-05-30 DIAGNOSIS — J9601 Acute respiratory failure with hypoxia: Secondary | ICD-10-CM | POA: Insufficient documentation

## 2022-05-30 DIAGNOSIS — J45909 Unspecified asthma, uncomplicated: Secondary | ICD-10-CM | POA: Insufficient documentation

## 2022-05-30 DIAGNOSIS — J9602 Acute respiratory failure with hypercapnia: Secondary | ICD-10-CM | POA: Diagnosis not present

## 2022-05-30 DIAGNOSIS — Z79899 Other long term (current) drug therapy: Secondary | ICD-10-CM | POA: Diagnosis not present

## 2022-05-30 DIAGNOSIS — I2510 Atherosclerotic heart disease of native coronary artery without angina pectoris: Secondary | ICD-10-CM | POA: Diagnosis present

## 2022-05-30 DIAGNOSIS — J449 Chronic obstructive pulmonary disease, unspecified: Secondary | ICD-10-CM | POA: Diagnosis present

## 2022-05-30 DIAGNOSIS — E119 Type 2 diabetes mellitus without complications: Secondary | ICD-10-CM | POA: Diagnosis present

## 2022-05-30 DIAGNOSIS — R112 Nausea with vomiting, unspecified: Secondary | ICD-10-CM | POA: Diagnosis present

## 2022-05-30 DIAGNOSIS — Z96652 Presence of left artificial knee joint: Secondary | ICD-10-CM | POA: Diagnosis not present

## 2022-05-30 DIAGNOSIS — F17210 Nicotine dependence, cigarettes, uncomplicated: Secondary | ICD-10-CM | POA: Insufficient documentation

## 2022-05-30 DIAGNOSIS — I509 Heart failure, unspecified: Secondary | ICD-10-CM | POA: Insufficient documentation

## 2022-05-30 DIAGNOSIS — G8929 Other chronic pain: Secondary | ICD-10-CM | POA: Diagnosis present

## 2022-05-30 DIAGNOSIS — I10 Essential (primary) hypertension: Secondary | ICD-10-CM | POA: Diagnosis not present

## 2022-05-30 DIAGNOSIS — E669 Obesity, unspecified: Secondary | ICD-10-CM | POA: Diagnosis present

## 2022-05-30 DIAGNOSIS — E876 Hypokalemia: Secondary | ICD-10-CM | POA: Insufficient documentation

## 2022-05-30 DIAGNOSIS — R079 Chest pain, unspecified: Secondary | ICD-10-CM | POA: Diagnosis not present

## 2022-05-30 DIAGNOSIS — J9621 Acute and chronic respiratory failure with hypoxia: Secondary | ICD-10-CM | POA: Diagnosis not present

## 2022-05-30 DIAGNOSIS — R0602 Shortness of breath: Principal | ICD-10-CM | POA: Insufficient documentation

## 2022-05-30 DIAGNOSIS — E785 Hyperlipidemia, unspecified: Secondary | ICD-10-CM | POA: Diagnosis present

## 2022-05-30 DIAGNOSIS — F418 Other specified anxiety disorders: Secondary | ICD-10-CM | POA: Diagnosis present

## 2022-05-30 LAB — CBC WITH DIFFERENTIAL/PLATELET
Abs Immature Granulocytes: 0.04 10*3/uL (ref 0.00–0.07)
Basophils Absolute: 0 10*3/uL (ref 0.0–0.1)
Basophils Relative: 0 %
Eosinophils Absolute: 0.3 10*3/uL (ref 0.0–0.5)
Eosinophils Relative: 4 %
HCT: 33 % — ABNORMAL LOW (ref 36.0–46.0)
Hemoglobin: 10.4 g/dL — ABNORMAL LOW (ref 12.0–15.0)
Immature Granulocytes: 0 %
Lymphocytes Relative: 20 %
Lymphs Abs: 1.9 10*3/uL (ref 0.7–4.0)
MCH: 25.9 pg — ABNORMAL LOW (ref 26.0–34.0)
MCHC: 31.5 g/dL (ref 30.0–36.0)
MCV: 82.1 fL (ref 80.0–100.0)
Monocytes Absolute: 0.6 10*3/uL (ref 0.1–1.0)
Monocytes Relative: 6 %
Neutro Abs: 6.4 10*3/uL (ref 1.7–7.7)
Neutrophils Relative %: 70 %
Platelets: 188 10*3/uL (ref 150–400)
RBC: 4.02 MIL/uL (ref 3.87–5.11)
RDW: 15.9 % — ABNORMAL HIGH (ref 11.5–15.5)
WBC: 9.2 10*3/uL (ref 4.0–10.5)
nRBC: 0 % (ref 0.0–0.2)

## 2022-05-30 LAB — TROPONIN I (HIGH SENSITIVITY)
Troponin I (High Sensitivity): 16 ng/L (ref <18)
Troponin I (High Sensitivity): 23 ng/L — ABNORMAL HIGH (ref <18)
Troponin I (High Sensitivity): 11 ng/L (ref <18)

## 2022-05-30 LAB — COMPREHENSIVE METABOLIC PANEL
ALT: 21 U/L (ref 0–44)
AST: 42 U/L — ABNORMAL HIGH (ref 15–41)
Albumin: 4 g/dL (ref 3.5–5.0)
Alkaline Phosphatase: 97 U/L (ref 38–126)
Anion gap: 10 (ref 5–15)
BUN: 11 mg/dL (ref 8–23)
CO2: 32 mmol/L (ref 22–32)
Calcium: 10.2 mg/dL (ref 8.9–10.3)
Chloride: 92 mmol/L — ABNORMAL LOW (ref 98–111)
Creatinine, Ser: 0.78 mg/dL (ref 0.44–1.00)
GFR, Estimated: >60 mL/min (ref >60)
Glucose, Bld: 175 mg/dL — ABNORMAL HIGH (ref 70–99)
Potassium: 3.7 mmol/L (ref 3.5–5.1)
Sodium: 134 mmol/L — ABNORMAL LOW (ref 135–145)
Total Bilirubin: 0.7 mg/dL (ref 0.3–1.2)
Total Protein: 9.1 g/dL — ABNORMAL HIGH (ref 6.5–8.1)

## 2022-05-30 LAB — CBC
HCT: 31 % — ABNORMAL LOW (ref 36.0–46.0)
Hemoglobin: 9.8 g/dL — ABNORMAL LOW (ref 12.0–15.0)
MCH: 26.2 pg (ref 26.0–34.0)
MCHC: 31.6 g/dL (ref 30.0–36.0)
MCV: 82.9 fL (ref 80.0–100.0)
Platelets: 153 10*3/uL (ref 150–400)
RBC: 3.74 MIL/uL — ABNORMAL LOW (ref 3.87–5.11)
RDW: 15.9 % — ABNORMAL HIGH (ref 11.5–15.5)
WBC: 7.3 10*3/uL (ref 4.0–10.5)
nRBC: 0 % (ref 0.0–0.2)

## 2022-05-30 LAB — I-STAT VENOUS BLOOD GAS, ED
Acid-Base Excess: 11 mmol/L — ABNORMAL HIGH (ref 0.0–2.0)
Bicarbonate: 38.9 mmol/L — ABNORMAL HIGH (ref 20.0–28.0)
Calcium, Ion: 1.26 mmol/L (ref 1.15–1.40)
HCT: 33 % — ABNORMAL LOW (ref 36.0–46.0)
Hemoglobin: 11.2 g/dL — ABNORMAL LOW (ref 12.0–15.0)
O2 Saturation: 58 %
Patient temperature: 98.5
Potassium: 3.7 mmol/L (ref 3.5–5.1)
Sodium: 136 mmol/L (ref 135–145)
TCO2: 41 mmol/L — ABNORMAL HIGH (ref 22–32)
pCO2, Ven: 71.5 mmHg (ref 44–60)
pH, Ven: 7.344 (ref 7.25–7.43)
pO2, Ven: 33 mmHg (ref 32–45)

## 2022-05-30 LAB — RESP PANEL BY RT-PCR (FLU A&B, COVID) ARPGX2
Influenza A by PCR: NEGATIVE
Influenza B by PCR: NEGATIVE
SARS Coronavirus 2 by RT PCR: NEGATIVE

## 2022-05-30 LAB — CREATININE, SERUM
Creatinine, Ser: 0.9 mg/dL (ref 0.44–1.00)
GFR, Estimated: >60 mL/min (ref >60)

## 2022-05-30 LAB — GLUCOSE, CAPILLARY: Glucose-Capillary: 97 mg/dL (ref 70–99)

## 2022-05-30 LAB — LIPASE, BLOOD: Lipase: 20 U/L (ref 11–51)

## 2022-05-30 LAB — HEMOGLOBIN A1C
Hgb A1c MFr Bld: 6.6 % — ABNORMAL HIGH (ref 4.8–5.6)
Mean Plasma Glucose: 142.72 mg/dL

## 2022-05-30 LAB — HIV ANTIBODY (ROUTINE TESTING W REFLEX): HIV Screen 4th Generation wRfx: NONREACTIVE

## 2022-05-30 LAB — BRAIN NATRIURETIC PEPTIDE: B Natriuretic Peptide: 41.3 pg/mL (ref 0.0–100.0)

## 2022-05-30 MED ORDER — BENAZEPRIL HCL 20 MG PO TABS
40.0000 mg | ORAL_TABLET | Freq: Every day | ORAL | Status: DC
  Administered 2022-05-30 – 2022-06-02 (×4): 40 mg via ORAL
  Filled 2022-05-30 (×4): qty 2

## 2022-05-30 MED ORDER — KETOROLAC TROMETHAMINE 15 MG/ML IJ SOLN
15.0000 mg | Freq: Once | INTRAMUSCULAR | Status: AC
  Administered 2022-05-30: 15 mg via INTRAVENOUS
  Filled 2022-05-30: qty 1

## 2022-05-30 MED ORDER — ALUM & MAG HYDROXIDE-SIMETH 200-200-20 MG/5ML PO SUSP: 30.0000 mL | Freq: Once | ORAL | Status: DC

## 2022-05-30 MED ORDER — CLONIDINE HCL 0.2 MG PO TABS: 0.2000 mg | ORAL_TABLET | Freq: Two times a day (BID) | ORAL | Status: DC | PRN

## 2022-05-30 MED ORDER — OXYCODONE HCL 5 MG PO TABS
15.0000 mg | ORAL_TABLET | Freq: Once | ORAL | Status: AC
  Administered 2022-05-30: 15 mg via ORAL
  Filled 2022-05-30: qty 3

## 2022-05-30 MED ORDER — ALBUTEROL SULFATE (2.5 MG/3ML) 0.083% IN NEBU: 2.5000 mg | INHALATION_SOLUTION | Freq: Four times a day (QID) | RESPIRATORY_TRACT | Status: DC | PRN

## 2022-05-30 MED ORDER — PANTOPRAZOLE SODIUM 40 MG PO TBEC
40.0000 mg | DELAYED_RELEASE_TABLET | Freq: Two times a day (BID) | ORAL | Status: DC
  Administered 2022-05-30 – 2022-06-02 (×6): 40 mg via ORAL
  Filled 2022-05-30 (×6): qty 1

## 2022-05-30 MED ORDER — FLUTICASONE PROPIONATE 50 MCG/ACT NA SUSP
1.0000 | Freq: Every day | NASAL | Status: DC | PRN
  Administered 2022-06-01 (×2): 1 via NASAL
  Filled 2022-05-30: qty 16

## 2022-05-30 MED ORDER — LORATADINE 10 MG PO TABS
5.0000 mg | ORAL_TABLET | Freq: Every evening | ORAL | Status: DC
  Administered 2022-05-30 – 2022-06-01 (×3): 5 mg via ORAL
  Filled 2022-05-30 (×3): qty 1
  Filled 2022-05-30: qty 0.5

## 2022-05-30 MED ORDER — SIMVASTATIN 20 MG PO TABS
20.0000 mg | ORAL_TABLET | Freq: Every day | ORAL | Status: DC
  Administered 2022-05-30 – 2022-06-01 (×3): 20 mg via ORAL
  Filled 2022-05-30 (×3): qty 1

## 2022-05-30 MED ORDER — ACETAMINOPHEN 325 MG PO TABS
650.0000 mg | ORAL_TABLET | Freq: Once | ORAL | Status: AC
  Administered 2022-05-30: 650 mg via ORAL
  Filled 2022-05-30: qty 2

## 2022-05-30 MED ORDER — OXYCODONE HCL 5 MG PO TABS
15.0000 mg | ORAL_TABLET | ORAL | Status: DC | PRN
  Administered 2022-05-30 – 2022-06-01 (×6): 15 mg via ORAL
  Filled 2022-05-30 (×7): qty 3

## 2022-05-30 MED ORDER — AMLODIPINE BESYLATE 5 MG PO TABS
5.0000 mg | ORAL_TABLET | Freq: Every day | ORAL | Status: DC
  Administered 2022-05-30 – 2022-06-02 (×4): 5 mg via ORAL
  Filled 2022-05-30 (×4): qty 1

## 2022-05-30 MED ORDER — ONDANSETRON HCL 4 MG PO TABS
4.0000 mg | ORAL_TABLET | Freq: Three times a day (TID) | ORAL | Status: DC | PRN
  Administered 2022-05-30: 4 mg via ORAL
  Filled 2022-05-30: qty 1

## 2022-05-30 MED ORDER — ONDANSETRON HCL 4 MG/2ML IJ SOLN: 4.0000 mg | Freq: Four times a day (QID) | INTRAMUSCULAR | Status: DC | PRN

## 2022-05-30 MED ORDER — MOMETASONE FURO-FORMOTEROL FUM 200-5 MCG/ACT IN AERO
2.0000 | INHALATION_SPRAY | Freq: Two times a day (BID) | RESPIRATORY_TRACT | Status: DC
  Administered 2022-05-30 – 2022-06-02 (×6): 2 via RESPIRATORY_TRACT
  Filled 2022-05-30: qty 8.8

## 2022-05-30 MED ORDER — MORPHINE SULFATE (PF) 2 MG/ML IV SOLN
1.0000 mg | Freq: Once | INTRAVENOUS | Status: AC
  Administered 2022-05-30: 1 mg via INTRAVENOUS
  Filled 2022-05-30: qty 1

## 2022-05-30 MED ORDER — ALPRAZOLAM 0.5 MG PO TABS
2.0000 mg | ORAL_TABLET | Freq: Every day | ORAL | Status: DC
  Administered 2022-05-30 – 2022-06-01 (×3): 2 mg via ORAL
  Filled 2022-05-30 (×4): qty 4

## 2022-05-30 MED ORDER — DOCUSATE SODIUM 100 MG PO CAPS
300.0000 mg | ORAL_CAPSULE | Freq: Every evening | ORAL | Status: DC
  Administered 2022-05-30 – 2022-05-31 (×2): 300 mg via ORAL
  Filled 2022-05-30 (×2): qty 3

## 2022-05-30 MED ORDER — ENOXAPARIN SODIUM 40 MG/0.4ML IJ SOSY
40.0000 mg | PREFILLED_SYRINGE | INTRAMUSCULAR | Status: DC
  Administered 2022-05-30 – 2022-06-01 (×3): 40 mg via SUBCUTANEOUS
  Filled 2022-05-30 (×3): qty 0.4

## 2022-05-30 MED ORDER — ESCITALOPRAM OXALATE 10 MG PO TABS: 20.0000 mg | ORAL_TABLET | Freq: Every evening | ORAL | Status: DC

## 2022-05-30 MED ORDER — ALBUTEROL SULFATE HFA 108 (90 BASE) MCG/ACT IN AERS: 2.0000 | INHALATION_SPRAY | RESPIRATORY_TRACT | Status: DC | PRN

## 2022-05-30 MED ORDER — ESCITALOPRAM OXALATE 10 MG PO TABS
20.0000 mg | ORAL_TABLET | Freq: Every evening | ORAL | Status: DC
  Administered 2022-05-30: 20 mg via ORAL
  Filled 2022-05-30: qty 2

## 2022-05-30 MED ORDER — FUROSEMIDE 10 MG/ML IJ SOLN
40.0000 mg | Freq: Once | INTRAMUSCULAR | Status: AC
  Administered 2022-05-30: 40 mg via INTRAVENOUS
  Filled 2022-05-30: qty 4

## 2022-05-30 MED ORDER — HYDROXYZINE HCL 25 MG PO TABS
25.0000 mg | ORAL_TABLET | Freq: Three times a day (TID) | ORAL | Status: DC | PRN
  Administered 2022-06-01 – 2022-06-02 (×2): 25 mg via ORAL
  Filled 2022-05-30 (×2): qty 1

## 2022-05-30 MED ORDER — ALPRAZOLAM 0.5 MG PO TABS
1.0000 mg | ORAL_TABLET | Freq: Every day | ORAL | Status: DC
  Administered 2022-06-02: 1 mg via ORAL
  Filled 2022-05-30 (×2): qty 2

## 2022-05-30 MED ORDER — DICYCLOMINE HCL 10 MG/5ML PO SOLN
10.0000 mg | Freq: Once | ORAL | Status: AC
  Administered 2022-05-30: 10 mg via ORAL
  Filled 2022-05-30: qty 5

## 2022-05-30 MED ORDER — BENAZEPRIL HCL 20 MG PO TABS: 40.0000 mg | ORAL_TABLET | Freq: Every day | ORAL | Status: DC

## 2022-05-30 MED ORDER — ACETAMINOPHEN 325 MG PO TABS
650.0000 mg | ORAL_TABLET | ORAL | Status: DC | PRN
  Administered 2022-06-01 – 2022-06-02 (×2): 650 mg via ORAL
  Filled 2022-05-30 (×2): qty 2

## 2022-05-30 MED ORDER — INSULIN ASPART 100 UNIT/ML IJ SOLN
0.0000 [IU] | Freq: Three times a day (TID) | INTRAMUSCULAR | Status: DC
  Administered 2022-05-31: 8 [IU] via SUBCUTANEOUS
  Administered 2022-05-31: 3 [IU] via SUBCUTANEOUS
  Administered 2022-05-31: 2 [IU] via SUBCUTANEOUS
  Administered 2022-06-01: 5 [IU] via SUBCUTANEOUS
  Administered 2022-06-01: 15 [IU] via SUBCUTANEOUS
  Administered 2022-06-01: 8 [IU] via SUBCUTANEOUS
  Administered 2022-06-02: 5 [IU] via SUBCUTANEOUS
  Administered 2022-06-02: 8 [IU] via SUBCUTANEOUS

## 2022-05-30 MED ORDER — TRAZODONE HCL 50 MG PO TABS
50.0000 mg | ORAL_TABLET | Freq: Every day | ORAL | Status: DC
  Administered 2022-05-30 – 2022-06-01 (×3): 100 mg via ORAL
  Filled 2022-05-30 (×5): qty 2

## 2022-05-30 MED ORDER — ALPRAZOLAM 0.5 MG PO TABS: 1.0000 mg | ORAL_TABLET | ORAL | Status: DC

## 2022-05-30 NOTE — H&P (Signed)
History and Physical    [name] HCW:[id number] DOB: 01-16-1961 DOA: 05/30/2022  PCP: Shirline Frees, MD  Patient coming from: Home  Chief Complaint: Chest pain  HPI: [name] is a 61 y.o. female with medical history significant of asthma, CAD, chronic back pain on opioids, cirrhosis 2/2 NASH, COPD, depression, anxiety, GERD, DM2, obesity, OSA, tobacco use who presents for pain.   Patient reports on the night prior to admission she began "hurting all over."  She then felt like she couldn't breathe.  She is on oxygen at home, but can't afford to have it all the time.  She was noted to also have weight gain, nausea, and emesis up to 24 times on the day prior to admission.  Further symptoms included coughing.  She had chest pain, under the left breast which was associated with nausea and sob.  She was requiring increased oxygen to keep saturations > 90 per report in the ED.  She notes the pain was squeezing and did not radiate.  She has significant chronic pain, so she is unsure if this was new or not.  She notes that it is certainly not similar to previous GI pains.   Patient reports being told she has a "blockage" in the back of her heart.   She was noted to have gained 10 pounds in the outpatient setting.  She has not taken her torsemide.  She notes increased abdominal swelling and was given lasix in the ED.  She notes that her breathing is better, but she has not tried to ambulate or be active this evening.   ED Course: In the ED, she was found to have a VBG with normal O2 and pH of 7.34.  She had a bicarb of 32, Cr of 0.78, AST of 42 and an elevated total protein (gamma gap of 5.1).  TnI was 16 and then 23.  H/H were 10 and 33, which is decreased from previous at 14/44 in July, though it appears her baseline is closer to 10-11.  Glucoses were elevated in the 100s.  CXR showed no acute disease.  EKG showed prolonged QTc (508) and possible ST depression (very mild) in III and aVF.  This will  need to be repeated.   Review of Systems: As per HPI otherwise all other systems reviewed and are negative.   Past Medical History:  Diagnosis Date   Abdominal pain 01/12/2019   Abdominal pain, generalized 07/10/2014   Acute on chronic respiratory failure with hypoxia ([name]) 10/18/2018   Anxiety    Arthritis    Ascites 05/01/2019   NALD   Asthma    CAD (coronary artery disease)    cath 2010 with 60-70% left Cx and normal LVF   Chronic low back pain with left-sided sciatica 10/15/2015   Chronic pain    Chronic prescription benzodiazepine use 05/01/2019   Chronic, continuous use of opioids 05/01/2019   Cirrhosis of liver not due to alcohol ([place]) 05/01/2019   Cirrhosis of liver: per CT 07/11/2014   Constipation by delayed colonic transit 12/09/2014   COPD (chronic obstructive pulmonary disease) ([name])    COPD exacerbation ([place]) 10/12/2016   Dehiscence of surgical wound left knee 03/16/2013   Depression    Depression with anxiety 01/11/2019   Deviated septum    Diabetes mellitus without complication ([name])    type 2   Generalized abdominal pain 12/09/2014   GERD (gastroesophageal reflux disease)    Headache(784.0)    migraines   Hepatic  cirrhosis ([place]) 06/2014   HLD (hyperlipidemia) 01/11/2019   Hypercholesterolemia    Hypertension    Hypertension associated with diabetes ([place])    Ileus ([place]) 07/11/2014   Influenza A 10/2018   Lobar pneumonia ([place])    Microcytic anemia 01/11/2019   Migraine headache 12/09/2014   Morbid obesity ([place]) 03/09/2013   Obesity    OSA (obstructive sleep apnea) 01/11/2019   Pars defect with spondylolisthesis 05/01/2019   Perforated bowel ([place])    PONV (postoperative nausea and vomiting)    Respiratory failure, acute-on-chronic ([place]) 12/11/2010   S/P left PF UKR 03/07/2013   Seizures ([place])    as a little girl 61 years old   Shortness of breath    Sleep apnea    dont wear bipap . lost weight   Spondylolisthesis, grade 2    Tobacco abuse    Type 2 diabetes  mellitus with obesity ([place]) 01/11/2019    Past Surgical History:  Procedure Laterality Date   ABDOMINAL EXPLORATION SURGERY     primary repair [facility] colon perforation [facility]   ABDOMINAL HYSTERECTOMY     total   BACK SURGERY     lumbar   COLONOSCOPY WITH PROPOFOL N/A 10/04/2019   Procedure: COLONOSCOPY WITH PROPOFOL;  Surgeon: Wilford Corner, MD;  Location: [facility];  Service: Endoscopy;  Laterality: N/A;   ESOPHAGOGASTRODUODENOSCOPY (EGD) WITH PROPOFOL N/A 10/29/2016   Procedure: ESOPHAGOGASTRODUODENOSCOPY (EGD) WITH PROPOFOL;  Surgeon: Laurence Spates, MD;  Location: [facility];  Service: Endoscopy;  Laterality: N/A;   ESOPHAGOGASTRODUODENOSCOPY (EGD) WITH PROPOFOL N/A 10/04/2019   Procedure: ESOPHAGOGASTRODUODENOSCOPY (EGD) WITH PROPOFOL;  Surgeon: Wilford Corner, MD;  Location: [facility];  Service: Endoscopy;  Laterality: N/A;   flank ablation     IR PARACENTESIS  01/12/2019   IR PARACENTESIS  05/02/2019   IR PARACENTESIS  08/03/2019   IRRIGATION AND DEBRIDEMENT KNEE Left 03/14/2013   Procedure: IRRIGATION AND DEBRIDEMENT  and Closure of wound left KNEE;  Surgeon: Mauri Pole, MD;  Location: [facility];  Service: Orthopedics;  Laterality: Left;   PATELLA-FEMORAL ARTHROPLASTY Left 03/07/2013   Procedure: LEFT PATELLA-FEMORAL ARTHROPLASTY;  Surgeon: Mauri Pole, MD;  Location: [facility];  Service: Orthopedics;  Laterality: Left;   SAVORY DILATION N/A 10/29/2016   Procedure: SAVORY DILATION;  Surgeon: Laurence Spates, MD;  Location: [facility];  Service: Endoscopy;  Laterality: N/A;    Social History  reports that she has been smoking cigarettes. She has been smoking an average of 1.5 packs per day. She has never used smokeless tobacco. She reports that she does not drink alcohol and does not use drugs.  Allergies  Allergen Reactions   Spironolactone Nausea Only    nausea   Gabapentin Other (See Comments)    sleepwalking   Sulfamethoxazole-Trimethoprim Hives   Amoxicillin  Nausea And Vomiting and Other (See Comments)    tolerated augmentin in the past (03/2013) without issue. Did it involve swelling of the face/tongue/throat, SOB, or low BP? No Did it involve sudden or severe rash/hives, skin peeling, or any reaction on the inside of your mouth or nose? No Did you need to seek medical attention at a hospital or doctor's office? No When did it last happen? unk       If all above answers are "NO", may proceed with cephalosporin use.    Clindamycin/Lincomycin Nausea And Vomiting   Doxycycline Nausea And Vomiting   Treximet [Sumatriptan-Naproxen Sodium] Nausea And Vomiting   Bactrim Hives and Rash    Family History  Problem Relation Age of Onset   Stroke Mother    Cancer - Lung Father    Heart attack Sister    Alcohol abuse Sister    Breast cancer Maternal Aunt    Hypertension Brother     Prior to Admission medications   Medication Sig Start Date End Date Taking? Authorizing Provider  acetaminophen (TYLENOL) 500 MG tablet Take 1,000 mg by mouth every 6 (six) hours as needed for moderate pain.    [provider]  albuterol (PROVENTIL HFA;VENTOLIN HFA) 108 (90 BASE) MCG/ACT inhaler Inhale 2 puffs into the lungs every 4 (four) hours as needed for wheezing or shortness of breath.     [provider]  albuterol (PROVENTIL) (2.5 MG/3ML) 0.083% nebulizer solution Take 2.5 mg by nebulization every 6 (six) hours as needed for wheezing or shortness of breath.    [provider]  ALPRAZolam Duanne Moron) 1 MG tablet Take 1-2 mg by mouth See admin instructions. 1 tablet in the morning 2 tablets at bedtime    [provider]  amLODipine (NORVASC) 5 MG tablet TAKE 1 TABLET BY MOUTH IN THE MORNING AND AT BEDTIME 04/16/21   Bhagat, Bhavinkumar, PA  benazepril (LOTENSIN) 40 MG tablet Take 40 mg by mouth daily.     [provider]  Benzocaine-Resorcinol (VAGISIL EX) Apply 1 application topically daily.    [provider]   cefdinir (OMNICEF) 300 MG capsule Take 1 capsule (300 mg total) by mouth every 12 (twelve) hours. Patient not taking: Reported on 02/27/2022 03/28/21   [name], MD  ciprofloxacin (CIPRO) 500 MG tablet Take 1 tablet (500 mg total) by mouth 2 (two) times daily. 02/27/22   Hayden Rasmussen, MD  cloNIDine (CATAPRES) 0.1 MG tablet Take 0.2 mg by mouth 2 (two) times daily as needed (blood pressure). 02/05/21   [provider]  docusate sodium (COLACE) 250 MG capsule Take 250 mg by mouth every evening.    [provider]  escitalopram (LEXAPRO) 20 MG tablet Take 20 mg by mouth every evening.  04/06/17   [provider]  estradiol (ESTRACE) 2 MG tablet Take 2 mg by mouth daily.     [provider]  fluticasone (FLONASE) 50 MCG/ACT nasal spray Place 1 spray into both nostrils daily as needed for allergies.    [provider]  Glycerin-Polysorbate 80 (REFRESH DRY EYE THERAPY OP) Place 1 drop into both eyes daily as needed (dry eyes).    [provider]  hydrOXYzine (ATARAX/VISTARIL) 25 MG tablet Take 25 mg by mouth every 8 (eight) hours as needed for anxiety.    [provider]  levocetirizine (XYZAL) 5 MG tablet Take 5 mg by mouth every evening.  09/16/19   [provider]  lidocaine (XYLOCAINE) 5 % ointment Apply 1 application topically 2 (two) times daily as needed (hemorrhoids).    [provider]  meloxicam (MOBIC) 15 MG tablet Take 15 mg by mouth daily as needed for pain.  09/16/19   [provider]  metFORMIN (GLUCOPHAGE) 500 MG tablet Take 2 tablets (1,000 mg total) by mouth 2 (two) times daily with a meal. Patient taking differently: Take 500 mg by mouth every evening. 12/13/14   Thurnell Lose, MD  metroNIDAZOLE (FLAGYL) 500 MG tablet Take 1 tablet (500 mg total) by mouth 2 (two) times daily. 02/27/22   Hayden Rasmussen, MD  naloxone [facility]) 4 MG/0.1ML LIQD nasal spray kit Place 1 spray into the nose as  needed (for  overdose).     [provider]  naproxen sodium (ALEVE) 220 MG tablet Take 440 mg by mouth daily as needed (pain).    [provider]  nystatin (MYCOSTATIN/NYSTOP) powder Apply 1 g topically daily as needed (irritation. (Typically during Summer months)).     [provider]  ondansetron (ZOFRAN) 4 MG tablet Take 1 tablet (4 mg total) by mouth every 8 (eight) hours as needed for nausea or vomiting. 03/15/20   Isla Pence, MD  oxyCODONE (ROXICODONE) 15 MG immediate release tablet Take 1 tablet (15 mg total) by mouth every 8 (eight) hours as needed for pain. 03/29/21   [name], MD  pantoprazole (PROTONIX) 40 MG tablet Take 40 mg by mouth 2 (two) times daily.     [provider]  PHAZYME MAXIMUM STRENGTH 250 MG CAPS Take 250 mg by mouth 2 (two) times daily as needed (gas relief).    [provider]  polyethylene glycol (MIRALAX / GLYCOLAX) 17 g packet Take 17 g by mouth daily as needed for moderate constipation. 03/28/21   [name], MD  psyllium (METAMUCIL) 58.6 % packet Take 1 packet by mouth daily as needed (regularity).     [provider]  simvastatin (ZOCOR) 20 MG tablet Take 20 mg by mouth at bedtime.    [provider]  SYMBICORT 160-4.5 MCG/ACT inhaler Inhale 2 puffs into the lungs in the morning and at bedtime.  02/09/20   [provider]  torsemide (DEMADEX) 20 MG tablet Take 40 mg by mouth daily as needed (fluid).  12/30/18   [provider]  traZODone (DESYREL) 50 MG tablet Take 50-100 mg by mouth at bedtime. 01/15/21   [provider]  triamcinolone cream (KENALOG) 0.1 % Apply 1 application topically 2 (two) times daily as needed (rash).    [provider]    Physical Exam: Vitals:   05/30/22 1401 05/30/22 1600 05/30/22 1645 05/30/22 2004  BP:  (!) 174/149 (!) 192/109 (!) 148/117  Pulse:  92 94   Resp:  20 (!) 21 18  Temp: 98.5 F (36.9 C)  99.1 F (37.3 C)  98.5 F (36.9 C)  TempSrc: Oral  Oral Oral  SpO2:  97% 96%   Weight:      Height:        Constitutional: NAD, calm, comfortable, well appearing, 2LNC, pox 97% Eyes: lids and conjunctivae normal ENMT: Mucous membranes are mildly dry. edentulous Neck: normal, supple Respiratory: CTAB, no wheezing Cardiovascular: Regular rate and rhythm.  + murmur best heard at LUSB, 2/6, Mild pitting edema to the mid calves.  Abdomen: TTP throughout, worse in the upper quadrants, distended but soft, +BS, + tympanic in the upper quadrants Musculoskeletal: no clubbing / cyanosis. Normal tone Skin: no rashes, lesions on exposed skin.  Chronic reddening to the skin of the legs and hands.  Psychiatric: Normal judgment and insight. Alert and oriented x 3. Normal mood.    Labs on Admission: I have personally reviewed following labs and imaging studies  CBC: Recent Labs  Lab 05/30/22 0444 05/30/22 0458  WBC 9.2  --   NEUTROABS 6.4  --   HGB 10.4* 11.2*  HCT 33.0* 33.0*  MCV 82.1  --   PLT 188  --     Basic Metabolic Panel: Recent Labs  Lab 05/30/22 0444 05/30/22 0458  NA 134* 136  K 3.7 3.7  CL 92*  --   CO2 32  --   GLUCOSE 175*  --  BUN 11  --   CREATININE 0.78  --   CALCIUM 10.2  --     GFR: Estimated Creatinine Clearance: 82 mL/min (by C-G formula based on SCr of 0.78 mg/dL).  Liver Function Tests: Recent Labs  Lab 05/30/22 0444  AST 42*  ALT 21  ALKPHOS 97  BILITOT 0.7  PROT 9.1*  ALBUMIN 4.0    Urine analysis:    Component Value Date/Time   COLORURINE YELLOW 03/22/2022 1708   APPEARANCEUR CLEAR 03/22/2022 1708   LABSPEC 1.026 03/22/2022 1708   PHURINE 5.5 03/22/2022 1708   GLUCOSEU NEGATIVE 03/22/2022 1708   HGBUR NEGATIVE 03/22/2022 1708   BILIRUBINUR NEGATIVE 03/22/2022 1708   KETONESUR NEGATIVE 03/22/2022 1708   PROTEINUR 30 (A) 03/22/2022 1708   UROBILINOGEN 0.2 12/09/2014 0840   NITRITE NEGATIVE 03/22/2022 1708   LEUKOCYTESUR NEGATIVE 03/22/2022 1708     Radiological Exams on Admission: DG Chest Portable 1 View  Result Date: 05/30/2022 CLINICAL DATA:  Shortness of breath, vomiting. EXAM: PORTABLE CHEST 1 VIEW COMPARISON:  02/08/2021. FINDINGS: The heart size and mediastinal contours are stable. No consolidation, effusion, or pneumothorax. No acute osseous abnormality. IMPRESSION: No active disease. Electronically Signed   By: Brett Fairy M.D.   On: 05/30/2022 05:00    EKG: Independently reviewed. Prolonged QTc, possible very mild ST depression.   Repeat EKG with very small ST elevation (1 box) in III and aVF.  Repeat TnI is pending.   Assessment/Plan  Central chest pain CAD (coronary artery disease) - No further chest pain since being in the ED, associated with SOB, unclear if cardiac, though has been told she has blockages in her arteries - Trend TnI, currently 16 --> 23 - Repeat EKG - Telemetry - Nitroglycerin PRN - TTE for further information - If concern for ACS is high, consider consulting cardiology in the AM, Dr. Radford Pax is outpatient cardiologist - EKG repeated and shows 1 box STE in III, aVF.  Chest pain is resolved, repeat TnI is pending; if above 100, would start heparin and call cardiology.   Cirrhosis of liver with ascites ([name]) Ascites NAFLD (nonalcoholic fatty liver disease) - Patient reports 10 pound weight gain, however, appears relatively euvolemic and increased oxygen needs are resolved - She is not on chronic diuretics, or lasix/spiro which is classic for cirrhosis - AM ultrasound to assess for ascites - Hold further diuresis at this time    Hypertension associated with diabetes ([place]) Type 2 diabetes mellitus with obesity ([place]) - Will order CPAP at night - Hold metformin - SSI - Continue amlodipine, benazepril, clonidine    COPD (chronic obstructive pulmonary disease) Asthma - No wheezing, CXR clear - Continue home therapy of ICS/Laba and PRN albuterol - Oxygen at 2L  currently - Ambulate with O2  tomorrow.  - Encourage tobacco cessation  Anemia, normocytic - Appears to be stable and at baseline - Will trend - Possible confounder to acute SOB  Morbid obesity - outpatient nutrition counseling.     Chronic low back pain with left-sided sciatica - Continue home pain regimen of oxycodone q 4 hours.  - Reviewed PDMP and she receives 180 tablets of Oxy IR 26m/month    Depression with anxiety - Continue home xanax, lexapro, hydroxyzine, trazodone    Chronic abdominal pain Nausea & vomiting - Continue zofran - Add compazine if needed      Chronic prescription benzodiazepine use Chronic, continuous use of opioids - Continue home medications  Gamma gap of 5.1 - Will add on  light chains and SPEP to AML       DVT prophylaxis: Lovenox  Code Status:   Full  Family Communication:  Husband at bedside  Disposition Plan:   Patient is from:  Home  Anticipated DC to:  Home  Anticipated DC date:  05/31/22  Anticipated DC barriers: Ability to walk, pain, oxygen needs  Consults called:  None, consider cardiology  Admission status:  Obs, Cardiac telemetry   Severity of Illness: The appropriate patient status for this patient is OBSERVATION. Observation status is judged to be reasonable and necessary in order to provide the required intensity of service to ensure the patient's safety. The patient's presenting symptoms, physical exam findings, and initial radiographic and laboratory data in the context of their medical condition is felt to place them at decreased risk for further clinical deterioration. Furthermore, it is anticipated that the patient will be medically stable for discharge from the hospital within 2 midnights of admission.     Gilles Chiquito MD [facility]  How to contact the [facility] Attending or Consulting provider [place] or covering provider during after hours [place], for this patient?   Check the care team in [facility] and look for a) attending/consulting [facility] provider  listed and b) the [facility] team listed Log into [email or web address] and use [place]'s universal password to access. If you do not have the password, please contact the hospital operator. Locate the [facility] provider you are looking for under [facility] and page to a number that you can be directly reached. If you still have difficulty reaching the provider, please page the [facility] (Director on Call) for the Hospitalists listed on amion for assistance.  05/30/2022, 8:44 PM

## 2022-05-30 NOTE — ED Notes (Signed)
RT ran VBG with the following results. Critical PCO2 of 71.5 mmHg on [place] 2 Lpm. MD Long notified of critical. RT will continue to monitor while pt in [facility] ED.   Latest Reference Range & Units 05/30/22 04:58  Sample type  VENOUS  pH, Ven 7.25 - 7.43  7.344  pCO2, Ven 44 - 60 mmHg 71.5 (HH)  pO2, Ven 32 - 45 mmHg 33  TCO2 22 - 32 mmol/L 41 (H)  Acid-Base Excess 0.0 - 2.0 mmol/L 11.0 (H)  Bicarbonate 20.0 - 28.0 mmol/L 38.9 (H)  O2 Saturation % 58  Patient temperature  98.5 F  Collection site  IV start

## 2022-05-30 NOTE — ED Notes (Signed)
Handoff report given to [facility] 

## 2022-05-30 NOTE — ED Triage Notes (Signed)
Arrives POV from home with spouse after waking up around midnight short of breath and vomiting.   Pt sts she has a ~65% heart blockage and that she is unable to catch her breath.   Upon ambulating to room pt displays labored breathing, and anxious. Room air oxygen sats ~80% but improve back to 96% moments later with rest.

## 2022-05-30 NOTE — ED Notes (Signed)
Called report to [facility]. Spoke with Claiborne Billings, RN over room [phone].

## 2022-05-30 NOTE — ED Notes (Signed)
[facility] at bedside 

## 2022-05-30 NOTE — ED Provider Notes (Signed)
Emergency Department Provider Note   I have reviewed the triage vital signs and the nursing notes.   HISTORY  Chief Complaint Shortness of Breath   HPI [name] is a 61 y.o. female with PMH of COPD, CAD, NASH, HTN, and HLD presents to the ED with SOB, vomiting, and CP.  Patient states that she has had nausea and vomiting this evening without significant abdominal pain.  She discussed more distention and fluid in her legs.  She developed shortness of breath in the setting of vomiting and then while getting prepared to come to the ED developed discomfort in her chest.  Describes it as a pressure and nonradiating.  No fevers or chills.  She has had intermittent headache over the past several days which is typical of her migraine and which she attributed to elevated blood pressures.  She has been prescribed home oxygen in the past but due to cost issues cannot afford it and so arrives without O2.    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   Cirrhosis of liver: per CT 07/11/2014   Constipation by delayed colonic transit 12/09/2014   COPD (chronic obstructive pulmonary disease) ([name])    COPD exacerbation ([place]) 10/12/2016   Dehiscence of surgical wound left knee 03/16/2013   Depression    Depression with anxiety 01/11/2019   Deviated septum    Diabetes mellitus without complication ([name])    type 2   Generalized abdominal pain 12/09/2014   GERD (gastroesophageal reflux disease)     Headache(784.0)    migraines   Hepatic cirrhosis ([place]) 06/2014   HLD (hyperlipidemia) 01/11/2019   Hypercholesterolemia    Hypertension    Hypertension associated with diabetes ([place])    Ileus ([place]) 07/11/2014   Influenza A 10/2018   Lobar pneumonia ([place])    Microcytic anemia 01/11/2019   Migraine headache 12/09/2014   Morbid obesity ([place]) 03/09/2013   Obesity    OSA (obstructive sleep apnea) 01/11/2019   Pars defect with spondylolisthesis 05/01/2019   Perforated bowel ([place])    PONV (postoperative nausea and vomiting)    Respiratory failure, acute-on-chronic ([place]) 12/11/2010   S/P left PF UKR 03/07/2013   Seizures ([place])    as a little girl 61 years old   Shortness of breath    Sleep apnea    dont wear bipap . lost weight   Spondylolisthesis, grade 2    Tobacco abuse    Type 2 diabetes mellitus with obesity ([place]) 01/11/2019    Review of Systems  Constitutional: No fever/chills Cardiovascular: Denies chest pain. Respiratory: Positive shortness of breath. Gastrointestinal: No abdominal pain. Positive nausea and vomiting.  No diarrhea.  No constipation. Genitourinary: Negative for dysuria. Musculoskeletal: Negative for back pain. Skin: Negative for rash. Neuro: Positive HA.  ____________________________________________   PHYSICAL EXAM:  VITAL SIGNS: ED Triage Vitals  Enc Vitals Group     BP 05/30/22 0434 (!) 154/68  Pulse Rate 05/30/22 0427 (!) 117     Resp 05/30/22 0433 16     Temp 05/30/22 0434 98.5 F (36.9 C)     Temp Source 05/30/22 0434 Oral     SpO2 05/30/22 0427 (!) 80 %     Weight 05/30/22 0432 240 lb (108.9 kg)     Height 05/30/22 0432 5' 1"  (1.549 m)   Constitutional: Alert and oriented. Well appearing and in no acute distress. Eyes: Conjunctivae are normal.  Head: Atraumatic. Nose: No congestion/rhinnorhea. Mouth/Throat: Mucous membranes are moist. Neck: No stridor.   Cardiovascular: Normal rate, regular rhythm. Good peripheral circulation. Grossly  normal heart sounds.   Respiratory: Normal respiratory effort.  No retractions. Lungs CTAB but diminished throughout. No wheezing.  Gastrointestinal: Soft and nontender. No distention.  Musculoskeletal: No lower extremity tenderness nor edema. No gross deformities of extremities. Neurologic:  Normal speech and language. No gross focal neurologic deficits are appreciated.  Skin:  Skin is warm, dry and intact. No rash noted.  ____________________________________________   LABS (all labs ordered are listed, but only abnormal results are displayed)  Labs Reviewed  COMPREHENSIVE METABOLIC PANEL - Abnormal; Notable for the following components:      Result Value   Sodium 134 (*)    Chloride 92 (*)    Glucose, Bld 175 (*)    Total Protein 9.1 (*)    AST 42 (*)    All other components within normal limits  CBC WITH DIFFERENTIAL/PLATELET - Abnormal; Notable for the following components:   Hemoglobin 10.4 (*)    HCT 33.0 (*)    MCH 25.9 (*)    RDW 15.9 (*)    All other components within normal limits  I-STAT VENOUS BLOOD GAS, ED - Abnormal; Notable for the following components:   pCO2, Ven 71.5 (*)    Bicarbonate 38.9 (*)    TCO2 41 (*)    Acid-Base Excess 11.0 (*)    HCT 33.0 (*)    Hemoglobin 11.2 (*)    All other components within normal limits  RESP PANEL BY RT-PCR (FLU A&B, COVID) ARPGX2  BRAIN NATRIURETIC PEPTIDE  LIPASE, BLOOD  TROPONIN I (HIGH SENSITIVITY)   ____________________________________________  EKG   EKG Interpretation  Date/Time:  Friday May 30 2022 05:43:56 EDT Ventricular Rate:  103 PR Interval:  111 QRS Duration: 104 QT Interval:  388 QTC Calculation: 508 R Axis:   95 Text Interpretation: Sinus tachycardia Right axis deviation Minimal ST depression, inferior leads Prolonged QT interval Confirmed by Nanda Quinton [phone]) on 05/30/2022 5:45:44 AM        ____________________________________________  RADIOLOGY  DG Chest Portable 1  View  Result Date: 05/30/2022 CLINICAL DATA:  Shortness of breath, vomiting. EXAM: PORTABLE CHEST 1 VIEW COMPARISON:  02/08/2021. FINDINGS: The heart size and mediastinal contours are stable. No consolidation, effusion, or pneumothorax. No acute osseous abnormality. IMPRESSION: No active disease. Electronically Signed   By: Brett Fairy M.D.   On: 05/30/2022 05:00    ____________________________________________   PROCEDURES  Procedure(s) performed:   Procedures  CRITICAL CARE Performed by: Margette Fast Total critical care time: 35 minutes Critical care time was exclusive of separately billable procedures and treating other patients. Critical care was necessary to treat or prevent imminent or life-threatening deterioration. Critical care was time spent personally by me on the following activities: development of treatment plan with patient and/or surrogate as well as nursing, discussions with consultants, evaluation of patient's response to treatment, examination of patient, obtaining history  from patient or surrogate, ordering and performing treatments and interventions, ordering and review of laboratory studies, ordering and review of radiographic studies, pulse oximetry and re-evaluation of patient's condition.  Nanda Quinton, MD Emergency Medicine  ____________________________________________   INITIAL IMPRESSION / ASSESSMENT AND PLAN / ED COURSE  Pertinent labs & imaging results that were available during my care of the patient were reviewed by me and considered in my medical decision making (see chart for details).   This patient is Presenting for Evaluation of SOB, which does require a range of treatment options, and is a complaint that involves a high risk of morbidity and mortality.  The Differential Diagnoses include aspiration PNA, CAP, COVID, CAD, PE, CHF, etc.  Critical Interventions-    Medications  furosemide (LASIX) injection 40 mg (40 mg Intravenous Given 05/30/22  0530)  ketorolac (TORADOL) 15 MG/ML injection 15 mg (15 mg Intravenous Given 05/30/22 0529)    Reassessment after intervention: SOB improved with rest and supplemental O2.    I decided to review pertinent External Data, and in summary most recent ED visits in our system for abdominal pain.   Clinical Laboratory Tests Ordered, included mild respiratory acidosis with pH of 7.3 and CO2 of 71.  No leukocytosis or severe anemia.   Radiologic Tests Ordered, included CXR. I independently interpreted the images and agree with radiology interpretation.   Cardiac Monitor Tracing which shows NSR.   Social Determinants of Health Risk positive smoking history.   Consult complete with Hospitalist, Dr. Hal Hope. Plan for admit.   Medical Decision Making: Summary:  Patient presents emergency department for evaluation of shortness of breath.  No wheezing on exam.  She has elevated CO2 on VBG but pH is near normal.  Suspect this is more chronic.  Patient does have pitting edema in the legs as well as the lower abdomen which may be contributing to her shortness of breath.  She is not on home O2 and hypoxic here to 80% on RA but improved on 2L [place] O2.  Reevaluation with update and discussion with patient. She is feeling and looking better. Plan for lasix and Toradol for HA. Plan for admit.   Disposition: admit  ____________________________________________  FINAL CLINICAL IMPRESSION(S) / ED DIAGNOSES  Final diagnoses:  Acute respiratory failure with hypoxia and hypercapnia ([place])    Note:  This document was prepared using Dragon voice recognition software and may include unintentional dictation errors.  Nanda Quinton, MD, [facility] Emergency Medicine    Demetria Lightsey, [facility], MD 05/30/22 [phone]

## 2022-05-31 ENCOUNTER — Observation Stay (HOSPITAL_COMMUNITY): Payer: Medicare Other

## 2022-05-31 ENCOUNTER — Other Ambulatory Visit (HOSPITAL_COMMUNITY): Payer: Medicaid Other

## 2022-05-31 ENCOUNTER — Observation Stay (HOSPITAL_BASED_OUTPATIENT_CLINIC_OR_DEPARTMENT_OTHER): Payer: Medicare Other

## 2022-05-31 DIAGNOSIS — J449 Chronic obstructive pulmonary disease, unspecified: Secondary | ICD-10-CM

## 2022-05-31 DIAGNOSIS — F17210 Nicotine dependence, cigarettes, uncomplicated: Secondary | ICD-10-CM | POA: Diagnosis not present

## 2022-05-31 DIAGNOSIS — I10 Essential (primary) hypertension: Secondary | ICD-10-CM | POA: Diagnosis not present

## 2022-05-31 DIAGNOSIS — E876 Hypokalemia: Secondary | ICD-10-CM | POA: Diagnosis not present

## 2022-05-31 DIAGNOSIS — R109 Unspecified abdominal pain: Secondary | ICD-10-CM

## 2022-05-31 DIAGNOSIS — J9602 Acute respiratory failure with hypercapnia: Secondary | ICD-10-CM | POA: Diagnosis not present

## 2022-05-31 DIAGNOSIS — E119 Type 2 diabetes mellitus without complications: Secondary | ICD-10-CM | POA: Diagnosis not present

## 2022-05-31 DIAGNOSIS — I2510 Atherosclerotic heart disease of native coronary artery without angina pectoris: Secondary | ICD-10-CM | POA: Diagnosis not present

## 2022-05-31 DIAGNOSIS — Z20822 Contact with and (suspected) exposure to covid-19: Secondary | ICD-10-CM | POA: Diagnosis not present

## 2022-05-31 DIAGNOSIS — J9601 Acute respiratory failure with hypoxia: Secondary | ICD-10-CM | POA: Diagnosis not present

## 2022-05-31 DIAGNOSIS — K7460 Unspecified cirrhosis of liver: Secondary | ICD-10-CM | POA: Diagnosis not present

## 2022-05-31 DIAGNOSIS — J45909 Unspecified asthma, uncomplicated: Secondary | ICD-10-CM | POA: Diagnosis not present

## 2022-05-31 DIAGNOSIS — R161 Splenomegaly, not elsewhere classified: Secondary | ICD-10-CM | POA: Diagnosis not present

## 2022-05-31 DIAGNOSIS — R079 Chest pain, unspecified: Secondary | ICD-10-CM | POA: Diagnosis not present

## 2022-05-31 DIAGNOSIS — M5442 Lumbago with sciatica, left side: Secondary | ICD-10-CM

## 2022-05-31 DIAGNOSIS — G8929 Other chronic pain: Secondary | ICD-10-CM | POA: Diagnosis not present

## 2022-05-31 DIAGNOSIS — Z79899 Other long term (current) drug therapy: Secondary | ICD-10-CM

## 2022-05-31 DIAGNOSIS — R0789 Other chest pain: Secondary | ICD-10-CM | POA: Diagnosis not present

## 2022-05-31 DIAGNOSIS — R188 Other ascites: Secondary | ICD-10-CM

## 2022-05-31 DIAGNOSIS — F1190 Opioid use, unspecified, uncomplicated: Secondary | ICD-10-CM

## 2022-05-31 DIAGNOSIS — Z96652 Presence of left artificial knee joint: Secondary | ICD-10-CM | POA: Diagnosis not present

## 2022-05-31 DIAGNOSIS — Z7984 Long term (current) use of oral hypoglycemic drugs: Secondary | ICD-10-CM | POA: Diagnosis not present

## 2022-05-31 DIAGNOSIS — R9431 Abnormal electrocardiogram [ECG] [EKG]: Secondary | ICD-10-CM | POA: Diagnosis not present

## 2022-05-31 DIAGNOSIS — R0602 Shortness of breath: Secondary | ICD-10-CM | POA: Diagnosis not present

## 2022-05-31 LAB — COMPREHENSIVE METABOLIC PANEL
ALT: 23 U/L (ref 0–44)
AST: 45 U/L — ABNORMAL HIGH (ref 15–41)
Albumin: 3 g/dL — ABNORMAL LOW (ref 3.5–5.0)
Alkaline Phosphatase: 87 U/L (ref 38–126)
Anion gap: 12 (ref 5–15)
BUN: 7 mg/dL — ABNORMAL LOW (ref 8–23)
CO2: 31 mmol/L (ref 22–32)
Calcium: 9.4 mg/dL (ref 8.9–10.3)
Chloride: 95 mmol/L — ABNORMAL LOW (ref 98–111)
Creatinine, Ser: 0.88 mg/dL (ref 0.44–1.00)
GFR, Estimated: >60 mL/min (ref >60)
Glucose, Bld: 194 mg/dL — ABNORMAL HIGH (ref 70–99)
Potassium: 3.6 mmol/L (ref 3.5–5.1)
Sodium: 138 mmol/L (ref 135–145)
Total Bilirubin: 0.5 mg/dL (ref 0.3–1.2)
Total Protein: 8 g/dL (ref 6.5–8.1)

## 2022-05-31 LAB — LIPID PANEL
Cholesterol: 132 mg/dL (ref 0–200)
HDL: 34 mg/dL — ABNORMAL LOW (ref >40)
LDL Cholesterol: 75 mg/dL (ref 0–99)
Total CHOL/HDL Ratio: 3.9 RATIO
Triglycerides: 115 mg/dL (ref <150)
VLDL: 23 mg/dL (ref 0–40)

## 2022-05-31 LAB — GLUCOSE, CAPILLARY
Glucose-Capillary: 145 mg/dL — ABNORMAL HIGH (ref 70–99)
Glucose-Capillary: 255 mg/dL — ABNORMAL HIGH (ref 70–99)
Glucose-Capillary: 154 mg/dL — ABNORMAL HIGH (ref 70–99)
Glucose-Capillary: 139 mg/dL — ABNORMAL HIGH (ref 70–99)

## 2022-05-31 LAB — CBC
HCT: 30.8 % — ABNORMAL LOW (ref 36.0–46.0)
Hemoglobin: 9.7 g/dL — ABNORMAL LOW (ref 12.0–15.0)
MCH: 26.4 pg (ref 26.0–34.0)
MCHC: 31.5 g/dL (ref 30.0–36.0)
MCV: 83.7 fL (ref 80.0–100.0)
Platelets: 145 10*3/uL — ABNORMAL LOW (ref 150–400)
RBC: 3.68 MIL/uL — ABNORMAL LOW (ref 3.87–5.11)
RDW: 15.7 % — ABNORMAL HIGH (ref 11.5–15.5)
WBC: 6.8 10*3/uL (ref 4.0–10.5)
nRBC: 0 % (ref 0.0–0.2)

## 2022-05-31 LAB — ECHOCARDIOGRAM COMPLETE
AR max vel: 2.27 cm2
AV Area VTI: 2.31 cm2
AV Area mean vel: 2.07 cm2
AV Mean grad: 10.6 mmHg
AV Peak grad: 21.2 mmHg
Ao pk vel: 2.3 m/s
Area-P 1/2: 3.99 cm2
Height: 61 in
S' Lateral: 2.7 cm
Weight: 3840 oz

## 2022-05-31 LAB — MAGNESIUM: Magnesium: 1.3 mg/dL — ABNORMAL LOW (ref 1.7–2.4)

## 2022-05-31 MED ORDER — ALUM & MAG HYDROXIDE-SIMETH 200-200-20 MG/5ML PO SUSP: 30.0000 mL | ORAL | Status: DC | PRN

## 2022-05-31 MED ORDER — MAGNESIUM SULFATE 4 GM/100ML IV SOLN
4.0000 g | Freq: Once | INTRAVENOUS | Status: AC
  Administered 2022-05-31: 4 g via INTRAVENOUS
  Filled 2022-05-31: qty 100

## 2022-05-31 MED ORDER — IOHEXOL 350 MG/ML SOLN
75.0000 mL | Freq: Once | INTRAVENOUS | Status: AC | PRN
  Administered 2022-05-31: 75 mL via INTRAVENOUS

## 2022-05-31 NOTE — Progress Notes (Signed)
PROGRESS NOTE  [name]:[id number] DOB: 07/15/1961 DOA: 05/30/2022 PCP: Shirline Frees, MD  HPI/Recap of past 24 hours: [name] is a 61 y.o. female with medical history significant of asthma, CAD, chronic back pain on opioids, cirrhosis 2/2 NASH, COPD, depression, anxiety, GERD, DM2, obesity, OSA, tobacco use who presents for multiple complaints of pain. Patient reports on the night prior to admission she began "hurting all over, worse around her abdomen for the past few days.  Complained of shortness of breath. She is on oxygen at home, but can't afford to have it all the time.  She was noted to also have weight gain, nausea, and emesis on the day prior to admission. She had chest pain under the left breast which was associated with nausea and sob.  She was requiring increased oxygen to keep saturations > 90 per report in the ED. She was noted to have gained 10 pounds in the outpatient setting.  She has not taken her torsemide.  She notes increased abdominal swelling. CXR showed no acute disease.  EKG showed prolonged QTc (508).  Patient admitted for further management.   Today, patient continues to complain of generalized abdominal pain, with also some generalized body aches.  Reports breathing is better on oxygen.  Symptoms appeared vague and was mostly concerned about getting pain medicine.   Assessment/Plan: Active Problems:   Chronic, continuous use of opioids   CAD (coronary artery disease)   Hypertension associated with diabetes ([name])   COPD (chronic obstructive pulmonary disease) ([name])   Morbid obesity ([name])   Chronic low back pain with left-sided sciatica   HLD (hyperlipidemia)   Type 2 diabetes mellitus with obesity ([name])   Depression with anxiety   Chronic abdominal pain   Cirrhosis of liver with ascites ([name])   Ascites   NAFLD (nonalcoholic fatty liver disease)   Chronic prescription benzodiazepine use   Nausea & vomiting   Central chest pain   Central  chest pain/Atypical CAD (coronary artery disease) Currently chest pain-free Trend TnI, currently 16 --> 23 EKG with prolonged Qtc Echo showed EF of 70 to 75%, no regional wall motion abnormalities, grade 2 diastolic dysfunction Telemetry  Prolonged Qtc EKG showed prolonged Qtc Avoid QTc prolonging meds, held escitalopram Telemetry  Hypomagnesemia Replace as needed  Cirrhosis of liver with ascites ([name]) Ascites NAFLD (nonalcoholic fatty liver disease) Pt is not on chronic diuretics, or lasix/spiro which is classic for cirrhosis Ultrasound to assess for ascites showed minimal ascites LFTs minimally elevated Lipase negative CT abdomen/pelvis pending since patient still complaining of abdominal pain    Hypertension  BP fairly stable Continue amlodipine, benazepril, clonidine  Type 2 diabetes mellitus with obesity ([name]) A1c 6.6 SSI, Accu-Cheks, hypoglycemic protocol Hold metformin  COPD (chronic obstructive pulmonary disease) Asthma No wheezing, CXR clear Saturating well on 2 L of oxygen Continue home therapy of ICS/Laba and PRN albuterol Supplemental O2  Anemia, normocytic Appears to be stable and at baseline Daily CBC    Chronic low back pain with left-sided sciatica ontinue home pain regimen of oxycodone q 4 hours.  Reviewed PDMP and she receives 180 tablets of Oxy IR 67m/month    Depression with anxiety Continue home xanax, hydroxyzine, trazodone, held escitalopram due to prolonged QTc      Chronic prescription benzodiazepine use Chronic, continuous use of opioids Continue home medications   Morbid obesity Lifestyle modification advised  Tobacco abuse Advised to quit     Estimated body mass index is 45.35 kg/m  as calculated from the following:   Height as of this encounter: 5' 1"  (1.549 m).   Weight as of this encounter: 108.9 kg.     Code Status: Full  Family Communication: None at bedside  Disposition Plan: Status is: Observation The  patient remains OBS appropriate and will d/c before 2 midnights.      Consultants: None  Procedures: None  Antimicrobials: None  DVT prophylaxis: Lovenox   Objective: Vitals:   05/31/22 0833 05/31/22 0912 05/31/22 1211 05/31/22 1507  BP: (!) 133/54 (!) 171/89 (!) 149/75   Pulse: 79 88 90 99  Resp: 18  15   Temp: 98.1 F (36.7 C)  99 F (37.2 C)   TempSrc: Oral  Oral   SpO2: 97% 100% 97% 98%  Weight:      Height:        Intake/Output Summary (Last 24 hours) at 05/31/2022 1601 Last data filed at 05/31/2022 1441 Gross per 24 hour  Intake 1217 ml  Output --  Net 1217 ml   Filed Weights   05/30/22 0432  Weight: 108.9 kg    Exam: General: NAD  Cardiovascular: S1, S2 present Respiratory: Diminished breath sounds bilaterally Abdomen: Soft, nontender, nondistended, bowel sounds present Musculoskeletal: No bilateral pedal edema noted Skin: Normal Psychiatry: Normal mood     Data Reviewed: CBC: Recent Labs  Lab 05/30/22 0444 05/30/22 0458 05/30/22 2119 05/31/22 0254  WBC 9.2  --  7.3 6.8  NEUTROABS 6.4  --   --   --   HGB 10.4* 11.2* 9.8* 9.7*  HCT 33.0* 33.0* 31.0* 30.8*  MCV 82.1  --  82.9 83.7  PLT 188  --  153 250*   Basic Metabolic Panel: Recent Labs  Lab 05/30/22 0444 05/30/22 0458 05/30/22 2119 05/31/22 0254  NA 134* 136  --  138  K 3.7 3.7  --  3.6  CL 92*  --   --  95*  CO2 32  --   --  31  GLUCOSE 175*  --   --  194*  BUN 11  --   --  7*  CREATININE 0.78  --  0.90 0.88  CALCIUM 10.2  --   --  9.4  MG  --   --   --  1.3*   GFR: Estimated Creatinine Clearance: 74.6 mL/min (by C-G formula based on SCr of 0.88 mg/dL). Liver Function Tests: Recent Labs  Lab 05/30/22 0444 05/31/22 0254  AST 42* 45*  ALT 21 23  ALKPHOS 97 87  BILITOT 0.7 0.5  PROT 9.1* 8.0  ALBUMIN 4.0 3.0*   Recent Labs  Lab 05/30/22 0444  LIPASE 20   No results for input(s): "AMMONIA" in the last 168 hours. Coagulation Profile: No results for  input(s): "INR", "PROTIME" in the last 168 hours. Cardiac Enzymes: No results for input(s): "CKTOTAL", "CKMB", "CKMBINDEX", "TROPONINI" in the last 168 hours. BNP (last 3 results) No results for input(s): "PROBNP" in the last 8760 hours. HbA1C: Recent Labs    05/30/22 2119  HGBA1C 6.6*   CBG: Recent Labs  Lab 05/30/22 2007 05/31/22 0830 05/31/22 1324  GLUCAP 97 145* 255*   Lipid Profile: Recent Labs    05/31/22 0254  CHOL 132  HDL 34*  LDLCALC 75  TRIG 115  CHOLHDL 3.9   Thyroid Function Tests: No results for input(s): "TSH", "T4TOTAL", "FREET4", "T3FREE", "THYROIDAB" in the last 72 hours. Anemia Panel: No results for input(s): "VITAMINB12", "FOLATE", "FERRITIN", "TIBC", "IRON", "RETICCTPCT" in the last 72  hours. Urine analysis:    Component Value Date/Time   COLORURINE YELLOW 03/22/2022 1708   APPEARANCEUR CLEAR 03/22/2022 1708   LABSPEC 1.026 03/22/2022 1708   PHURINE 5.5 03/22/2022 1708   GLUCOSEU NEGATIVE 03/22/2022 1708   HGBUR NEGATIVE 03/22/2022 1708   BILIRUBINUR NEGATIVE 03/22/2022 1708   KETONESUR NEGATIVE 03/22/2022 1708   PROTEINUR 30 (A) 03/22/2022 1708   UROBILINOGEN 0.2 12/09/2014 0840   NITRITE NEGATIVE 03/22/2022 1708   LEUKOCYTESUR NEGATIVE 03/22/2022 1708   Sepsis Labs: @LABRCNTIP (procalcitonin:4,lacticidven:4)  ) Recent Results (from the past 240 hour(s))  Resp Panel by RT-PCR (Flu A&B, Covid) Anterior Nasal Swab     Status: None   Collection Time: 05/30/22  4:44 AM   Specimen: Anterior Nasal Swab  Result Value Ref Range Status   SARS Coronavirus 2 by RT PCR NEGATIVE NEGATIVE Final    Comment: (NOTE) SARS-CoV-2 target nucleic acids are NOT DETECTED.  The SARS-CoV-2 RNA is generally detectable in upper respiratory specimens during the acute phase of infection. The lowest concentration of SARS-CoV-2 viral copies this assay can detect is 138 copies/mL. A negative result does not preclude SARS-Cov-2 infection and should not be used as  the sole basis for treatment or other patient management decisions. A negative result may occur with  improper specimen collection/handling, submission of specimen other than nasopharyngeal swab, presence of viral mutation(s) within the areas targeted by this assay, and inadequate number of viral copies(<138 copies/mL). A negative result must be combined with clinical observations, patient history, and epidemiological information. The expected result is Negative.  Fact Sheet for Patients:  EntrepreneurPulse.com.au  Fact Sheet for Healthcare Providers:  IncredibleEmployment.be  This test is no t yet approved or cleared by the [place] FDA and  has been authorized for detection and/or diagnosis of SARS-CoV-2 by FDA under an Emergency Use Authorization (EUA). This EUA will remain  in effect (meaning this test can be used) for the duration of the COVID-19 declaration under Section 564(b)(1) of the Act, 21 U.S.C.section 360bbb-3(b)(1), unless the authorization is terminated  or revoked sooner.       Influenza A by PCR NEGATIVE NEGATIVE Final   Influenza B by PCR NEGATIVE NEGATIVE Final    Comment: (NOTE) The Xpert Xpress SARS-CoV-2/FLU/RSV plus assay is intended as an aid in the diagnosis of influenza from Nasopharyngeal swab specimens and should not be used as a sole basis for treatment. Nasal washings and aspirates are unacceptable for Xpert Xpress SARS-CoV-2/FLU/RSV testing.  Fact Sheet for Patients: EntrepreneurPulse.com.au  Fact Sheet for Healthcare Providers: IncredibleEmployment.be  This test is not yet approved or cleared by the [place] FDA and has been authorized for detection and/or diagnosis of SARS-CoV-2 by FDA under an Emergency Use Authorization (EUA). This EUA will remain in effect (meaning this test can be used) for the duration of the COVID-19 declaration under Section 564(b)(1) of  the Act, 21 U.S.C. section 360bbb-3(b)(1), unless the authorization is terminated or revoked.  Performed at [facility], [address] [place],  [address]       Studies: ECHOCARDIOGRAM COMPLETE  Result Date: 05/31/2022    ECHOCARDIOGRAM REPORT   Patient Name:   DARNELL STIMSON Date of Exam: 05/31/2022 Medical Rec #:  [id number]    Height:       61.0 in Accession #:    [id number]   Weight:       240.0 lb Date of Birth:  14-Jul-1961    BSA:  2.041 m Patient Age:    89 years     BP:           149/75 mmHg Patient Gender: F            HR:           95 bpm. Exam Location:  Inpatient Procedure: 2D Echo, Cardiac Doppler and Color Doppler Indications:    Chest Pain  History:        Patient has no prior history of Echocardiogram examinations.                 CAD, COPD; Risk Factors:Diabetes, Hypertension, Dyslipidemia and                 Sleep Apnea.  Sonographer:    Jefferey Pica Referring Phys: [place]  1. Left ventricular ejection fraction, by estimation, is 70 to 75%. The left ventricle has hyperdynamic function. The left ventricle has no regional wall motion abnormalities. Left ventricular diastolic parameters are consistent with Grade II diastolic dysfunction (pseudonormalization). Elevated left atrial pressure.  2. Right ventricular systolic function is normal. The right ventricular size is normal. There is mildly elevated pulmonary artery systolic pressure.  3. Left atrial size was mildly dilated.  4. The mitral valve is normal in structure. No evidence of mitral valve regurgitation. No evidence of mitral stenosis.  5. The aortic valve is normal in structure. Aortic valve regurgitation is not visualized. Aortic valve sclerosis is present, with no evidence of aortic valve stenosis.  6. The inferior vena cava is normal in size with greater than 50% respiratory variability, suggesting right atrial pressure of 3 mmHg. FINDINGS  Left Ventricle: Left  ventricular ejection fraction, by estimation, is 70 to 75%. The left ventricle has hyperdynamic function. The left ventricle has no regional wall motion abnormalities. The left ventricular internal cavity size was normal in size. There is no left ventricular hypertrophy. Left ventricular diastolic parameters are consistent with Grade II diastolic dysfunction (pseudonormalization). Elevated left atrial pressure. Right Ventricle: The right ventricular size is normal. No increase in right ventricular wall thickness. Right ventricular systolic function is normal. There is mildly elevated pulmonary artery systolic pressure. The tricuspid regurgitant velocity is 2.99  m/s, and with an assumed right atrial pressure of 5 mmHg, the estimated right ventricular systolic pressure is 62.3 mmHg. Left Atrium: Left atrial size was mildly dilated. Right Atrium: Right atrial size was normal in size. Pericardium: There is no evidence of pericardial effusion. Mitral Valve: The mitral valve is normal in structure. No evidence of mitral valve regurgitation. No evidence of mitral valve stenosis. Tricuspid Valve: The tricuspid valve is normal in structure. Tricuspid valve regurgitation is not demonstrated. No evidence of tricuspid stenosis. Aortic Valve: Increased aortic gradients are due to hyperdynamic state. The aortic valve is normal in structure. Aortic valve regurgitation is not visualized. Aortic valve sclerosis is present, with no evidence of aortic valve stenosis. Aortic valve mean  gradient measures 10.6 mmHg. Aortic valve peak gradient measures 21.2 mmHg. Aortic valve area, by VTI measures 2.31 cm. Pulmonic Valve: The pulmonic valve was normal in structure. Pulmonic valve regurgitation is not visualized. No evidence of pulmonic stenosis. Aorta: The aortic root is normal in size and structure. Venous: The inferior vena cava is normal in size with greater than 50% respiratory variability, suggesting right atrial pressure of 3  mmHg. IAS/Shunts: No atrial level shunt detected by color flow Doppler.  LEFT VENTRICLE PLAX 2D LVIDd:  5.30 cm   Diastology LVIDs:         2.70 cm   LV e' medial:    4.80 cm/s LV PW:         1.10 cm   LV E/e' medial:  24.6 LV IVS:        1.00 cm   LV e' lateral:   7.00 cm/s LVOT diam:     1.90 cm   LV E/e' lateral: 16.9 LV SV:         98 LV SV Index:   48 LVOT Area:     2.84 cm  RIGHT VENTRICLE             IVC RV S prime:     14.60 cm/s  IVC diam: 1.50 cm TAPSE (M-mode): 2.3 cm LEFT ATRIUM             Index        RIGHT ATRIUM           Index LA diam:        4.10 cm 2.01 cm/m   RA Area:     15.50 cm LA Vol (A2C):   72.0 ml 35.28 ml/m  RA Volume:   37.70 ml  18.47 ml/m LA Vol (A4C):   73.2 ml 35.87 ml/m LA Biplane Vol: 79.9 ml 39.15 ml/m  AORTIC VALVE                     PULMONIC VALVE AV Area (Vmax):    2.27 cm      PV Vmax:       1.27 m/s AV Area (Vmean):   2.07 cm      PV Peak grad:  6.5 mmHg AV Area (VTI):     2.31 cm AV Vmax:           230.17 cm/s AV Vmean:          145.424 cm/s AV VTI:            0.425 m AV Peak Grad:      21.2 mmHg AV Mean Grad:      10.6 mmHg LVOT Vmax:         184.00 cm/s LVOT Vmean:        106.000 cm/s LVOT VTI:          0.347 m LVOT/AV VTI ratio: 0.82  AORTA Ao Root diam: 2.90 cm Ao Asc diam:  2.50 cm MITRAL VALVE                TRICUSPID VALVE MV Area (PHT): 3.99 cm     TR Peak grad:   35.8 mmHg MV Decel Time: 190 msec     TR Vmax:        299.00 cm/s MV E velocity: 118.00 cm/s MV A velocity: 81.90 cm/s   SHUNTS MV E/A ratio:  1.44         Systemic VTI:  0.35 m                             Systemic Diam: 1.90 cm Dani Gobble Croitoru MD Electronically signed by Sanda Klein MD Signature Date/Time: 05/31/2022/2:56:33 PM    Final    [place] ASCITES (ABDOMEN LIMITED)  Result Date: 05/30/2022 CLINICAL DATA:  Evaluate for ascites, history of cirrhosis EXAM: LIMITED ABDOMEN ULTRASOUND FOR ASCITES TECHNIQUE: Limited ultrasound survey for ascites was performed in all four abdominal  quadrants. COMPARISON:  03/22/2022 CT  FINDINGS: Minimal ascites is noted primarily right abdomen. IMPRESSION: Minimal ascites.  No sizable window for paracentesis is noted. Electronically Signed   By: Inez Catalina M.D.   On: 05/30/2022 22:25    Scheduled Meds:  ALPRAZolam  1 mg Oral Daily   And   ALPRAZolam  2 mg Oral QHS   alum & mag hydroxide-simeth  30 mL Oral Once   amLODipine  5 mg Oral Daily   benazepril  40 mg Oral Daily   docusate sodium  300 mg Oral QPM   enoxaparin (LOVENOX) injection  40 mg Subcutaneous Q24H   insulin aspart  0-15 Units Subcutaneous TID WC   loratadine  5 mg Oral QPM   mometasone-formoterol  2 puff Inhalation BID   pantoprazole  40 mg Oral BID   simvastatin  20 mg Oral QHS   traZODone  50-100 mg Oral QHS    Continuous Infusions:   LOS: 0 days     Alma Friendly, MD [facility]  If 7PM-7AM, please contact night-coverage [email or web address] 05/31/2022, 4:01 PM

## 2022-05-31 NOTE — Progress Notes (Signed)
Morning EKG resulted with critical reading, prolonged Qtc. Pt asymptomatic. Cardiology paged.

## 2022-05-31 NOTE — Progress Notes (Signed)
Paged attending, pt is more awake now and is requesting pain medication for her chronic pain. Paged attending to discuss pain regimen.

## 2022-05-31 NOTE — Progress Notes (Signed)
Pt asked nurse tech for pain meds. Went to check on patient and pt is asleep.

## 2022-06-01 DIAGNOSIS — M5442 Lumbago with sciatica, left side: Secondary | ICD-10-CM | POA: Diagnosis not present

## 2022-06-01 DIAGNOSIS — R109 Unspecified abdominal pain: Secondary | ICD-10-CM | POA: Diagnosis not present

## 2022-06-01 DIAGNOSIS — Z96652 Presence of left artificial knee joint: Secondary | ICD-10-CM | POA: Diagnosis not present

## 2022-06-01 DIAGNOSIS — F17210 Nicotine dependence, cigarettes, uncomplicated: Secondary | ICD-10-CM | POA: Diagnosis not present

## 2022-06-01 DIAGNOSIS — K7460 Unspecified cirrhosis of liver: Secondary | ICD-10-CM | POA: Diagnosis not present

## 2022-06-01 DIAGNOSIS — J45909 Unspecified asthma, uncomplicated: Secondary | ICD-10-CM | POA: Diagnosis not present

## 2022-06-01 DIAGNOSIS — Z79899 Other long term (current) drug therapy: Secondary | ICD-10-CM | POA: Diagnosis not present

## 2022-06-01 DIAGNOSIS — J9602 Acute respiratory failure with hypercapnia: Secondary | ICD-10-CM | POA: Diagnosis not present

## 2022-06-01 DIAGNOSIS — I10 Essential (primary) hypertension: Secondary | ICD-10-CM | POA: Diagnosis not present

## 2022-06-01 DIAGNOSIS — Z20822 Contact with and (suspected) exposure to covid-19: Secondary | ICD-10-CM | POA: Diagnosis not present

## 2022-06-01 DIAGNOSIS — R188 Other ascites: Secondary | ICD-10-CM | POA: Diagnosis not present

## 2022-06-01 DIAGNOSIS — E876 Hypokalemia: Secondary | ICD-10-CM | POA: Diagnosis not present

## 2022-06-01 DIAGNOSIS — R9431 Abnormal electrocardiogram [ECG] [EKG]: Secondary | ICD-10-CM | POA: Diagnosis not present

## 2022-06-01 DIAGNOSIS — I2510 Atherosclerotic heart disease of native coronary artery without angina pectoris: Secondary | ICD-10-CM | POA: Diagnosis not present

## 2022-06-01 DIAGNOSIS — R0789 Other chest pain: Secondary | ICD-10-CM | POA: Diagnosis not present

## 2022-06-01 DIAGNOSIS — J9601 Acute respiratory failure with hypoxia: Secondary | ICD-10-CM | POA: Diagnosis not present

## 2022-06-01 DIAGNOSIS — R0602 Shortness of breath: Secondary | ICD-10-CM | POA: Diagnosis not present

## 2022-06-01 DIAGNOSIS — Z7984 Long term (current) use of oral hypoglycemic drugs: Secondary | ICD-10-CM | POA: Diagnosis not present

## 2022-06-01 DIAGNOSIS — G8929 Other chronic pain: Secondary | ICD-10-CM | POA: Diagnosis not present

## 2022-06-01 DIAGNOSIS — E119 Type 2 diabetes mellitus without complications: Secondary | ICD-10-CM | POA: Diagnosis not present

## 2022-06-01 DIAGNOSIS — J449 Chronic obstructive pulmonary disease, unspecified: Secondary | ICD-10-CM | POA: Diagnosis not present

## 2022-06-01 LAB — COMPREHENSIVE METABOLIC PANEL
ALT: 22 U/L (ref 0–44)
AST: 35 U/L (ref 15–41)
Albumin: 2.8 g/dL — ABNORMAL LOW (ref 3.5–5.0)
Alkaline Phosphatase: 79 U/L (ref 38–126)
Anion gap: 8 (ref 5–15)
BUN: 5 mg/dL — ABNORMAL LOW (ref 8–23)
CO2: 35 mmol/L — ABNORMAL HIGH (ref 22–32)
Calcium: 8 mg/dL — ABNORMAL LOW (ref 8.9–10.3)
Chloride: 94 mmol/L — ABNORMAL LOW (ref 98–111)
Creatinine, Ser: 0.83 mg/dL (ref 0.44–1.00)
GFR, Estimated: >60 mL/min (ref >60)
Glucose, Bld: 186 mg/dL — ABNORMAL HIGH (ref 70–99)
Potassium: 3.4 mmol/L — ABNORMAL LOW (ref 3.5–5.1)
Sodium: 137 mmol/L (ref 135–145)
Total Bilirubin: 0.6 mg/dL (ref 0.3–1.2)
Total Protein: 7.6 g/dL (ref 6.5–8.1)

## 2022-06-01 LAB — CBC WITH DIFFERENTIAL/PLATELET
Abs Immature Granulocytes: 0.03 10*3/uL (ref 0.00–0.07)
Basophils Absolute: 0 10*3/uL (ref 0.0–0.1)
Basophils Relative: 0 %
Eosinophils Absolute: 0.2 10*3/uL (ref 0.0–0.5)
Eosinophils Relative: 3 %
HCT: 31.3 % — ABNORMAL LOW (ref 36.0–46.0)
Hemoglobin: 9.5 g/dL — ABNORMAL LOW (ref 12.0–15.0)
Immature Granulocytes: 0 %
Lymphocytes Relative: 27 %
Lymphs Abs: 2.1 10*3/uL (ref 0.7–4.0)
MCH: 25.7 pg — ABNORMAL LOW (ref 26.0–34.0)
MCHC: 30.4 g/dL (ref 30.0–36.0)
MCV: 84.8 fL (ref 80.0–100.0)
Monocytes Absolute: 0.7 10*3/uL (ref 0.1–1.0)
Monocytes Relative: 8 %
Neutro Abs: 4.7 10*3/uL (ref 1.7–7.7)
Neutrophils Relative %: 62 %
Platelets: 152 10*3/uL (ref 150–400)
RBC: 3.69 MIL/uL — ABNORMAL LOW (ref 3.87–5.11)
RDW: 15.8 % — ABNORMAL HIGH (ref 11.5–15.5)
WBC: 7.8 10*3/uL (ref 4.0–10.5)
nRBC: 0 % (ref 0.0–0.2)

## 2022-06-01 LAB — MAGNESIUM: Magnesium: 1.8 mg/dL (ref 1.7–2.4)

## 2022-06-01 LAB — GLUCOSE, CAPILLARY
Glucose-Capillary: 209 mg/dL — ABNORMAL HIGH (ref 70–99)
Glucose-Capillary: 228 mg/dL — ABNORMAL HIGH (ref 70–99)
Glucose-Capillary: 258 mg/dL — ABNORMAL HIGH (ref 70–99)
Glucose-Capillary: 193 mg/dL — ABNORMAL HIGH (ref 70–99)
Glucose-Capillary: 201 mg/dL — ABNORMAL HIGH (ref 70–99)

## 2022-06-01 MED ORDER — POLYETHYLENE GLYCOL 3350 17 G PO PACK
17.0000 g | PACK | Freq: Two times a day (BID) | ORAL | Status: DC
  Administered 2022-06-01 – 2022-06-02 (×2): 17 g via ORAL
  Filled 2022-06-01 (×2): qty 1

## 2022-06-01 MED ORDER — OXYCODONE HCL 5 MG PO TABS
15.0000 mg | ORAL_TABLET | Freq: Four times a day (QID) | ORAL | Status: DC | PRN
  Administered 2022-06-01 – 2022-06-02 (×4): 15 mg via ORAL
  Filled 2022-06-01 (×4): qty 3

## 2022-06-01 MED ORDER — CYCLOBENZAPRINE HCL 10 MG PO TABS
5.0000 mg | ORAL_TABLET | Freq: Once | ORAL | Status: AC
  Administered 2022-06-01: 5 mg via ORAL
  Filled 2022-06-01: qty 1

## 2022-06-01 MED ORDER — MAGNESIUM SULFATE 2 GM/50ML IV SOLN
2.0000 g | Freq: Once | INTRAVENOUS | Status: AC
  Administered 2022-06-01: 2 g via INTRAVENOUS
  Filled 2022-06-01: qty 50

## 2022-06-01 MED ORDER — SENNOSIDES-DOCUSATE SODIUM 8.6-50 MG PO TABS
1.0000 | ORAL_TABLET | Freq: Two times a day (BID) | ORAL | Status: DC
  Administered 2022-06-01 – 2022-06-02 (×2): 1 via ORAL
  Filled 2022-06-01 (×2): qty 1

## 2022-06-01 MED ORDER — POTASSIUM CHLORIDE CRYS ER 20 MEQ PO TBCR
40.0000 meq | EXTENDED_RELEASE_TABLET | Freq: Two times a day (BID) | ORAL | Status: AC
  Administered 2022-06-01 (×2): 40 meq via ORAL
  Filled 2022-06-01 (×2): qty 2

## 2022-06-01 NOTE — Evaluation (Signed)
Physical Therapy Evaluation Patient Details Name: [name] MRN: [id number] DOB: 06/02/1961 Today's Date: 06/01/2022  History of Present Illness  pt is a 61 y/o female presenting with pain all over associated with difficulty breathing with weight gain, nausea/emesis x up to 24 times and chest pain under her left breast.  Needed increased oxygen to keep sats >90%.  PMHx:  asthma, CAD, chronic back and abdominal pain on opioids, cirrhosis due to NASH, COPD, depression, DM2, obesity, OSA  Clinical Impression  Pt is at or close to baseline functioning and should be safe at home with available assist. There are no further acute PT needs.  Will sign off at this time.        Recommendations for follow up therapy are one component of a multi-disciplinary discharge planning process, led by the attending physician.  Recommendations may be updated based on patient status, additional functional criteria and insurance authorization.  Follow Up Recommendations No PT follow up      Assistance Recommended at Discharge Set up Supervision/Assistance  Patient can return home with the following       Equipment Recommendations    Recommendations for Other Services       Functional Status Assessment Patient has had a recent decline in their functional status and demonstrates the ability to make significant improvements in function in a reasonable and predictable amount of time.     Precautions / Restrictions Precautions Precautions: None      Mobility  Bed Mobility Overal bed mobility: Modified Independent                  Transfers Overall transfer level: Modified independent                      Ambulation/Gait Ambulation/Gait assistance: Supervision, Modified independent (Device/Increase time) Gait Distance (Feet): 120 Feet Assistive device: None Gait Pattern/deviations: Step-through pattern   Gait velocity interpretation: <1.8 ft/sec, indicate of risk for recurrent  falls   General Gait Details: steady short steps, slower that age appropriat.  Sats mid to upper 90's on RA  Stairs Stairs: Yes Stairs assistance: Modified independent (Device/Increase time) Stair Management: One rail Right, Step to pattern, Forwards Number of Stairs: 2 General stair comments: safe with rail  Wheelchair Mobility    Modified Rankin (Stroke Patients Only)       Balance Overall balance assessment: No apparent balance deficits (not formally assessed)                                           Pertinent Vitals/Pain Pain Assessment Pain Assessment: 0-10 Pain Score: 8  Pain Location: abdominal Pain Descriptors / Indicators: Sharp Pain Intervention(s): Patient requesting pain meds-RN notified, Limited activity within patient's tolerance, Monitored during session    Home Living Family/patient expects to be discharged to:: Private residence Living Arrangements: Spouse/significant other;Children Available Help at Discharge: Family;Available PRN/intermittently Type of Home: House Home Access: Stairs to enter Entrance Stairs-Rails: Right;Left     Home Layout: One level        Prior Function Prior Level of Function : Independent/Modified Independent               ADLs Comments: some stanby assist for showering.     Hand Dominance        Extremity/Trunk Assessment   Upper Extremity Assessment Upper Extremity Assessment: Overall WFL for tasks  assessed    Lower Extremity Assessment Lower Extremity Assessment: Overall WFL for tasks assessed (large proximal muscle group weakness)    Cervical / Trunk Assessment Cervical / Trunk Assessment: Kyphotic  Communication   Communication: No difficulties  Cognition Arousal/Alertness: Awake/alert Behavior During Therapy: WFL for tasks assessed/performed Overall Cognitive Status: Within Functional Limits for tasks assessed                                           General Comments      Exercises     Assessment/Plan    PT Assessment Patient does not need any further PT services  PT Problem List         PT Treatment Interventions      PT Goals (Current goals can be found in the Care Plan section)  Acute Rehab PT Goals Patient Stated Goal: need my meds PT Goal Formulation: With patient    Frequency       Co-evaluation               AM-PAC PT "6 Clicks" Mobility  Outcome Measure Help needed turning from your back to your side while in a flat bed without using bedrails?: None Help needed moving from lying on your back to sitting on the side of a flat bed without using bedrails?: None Help needed moving to and from a bed to a chair (including a wheelchair)?: None Help needed standing up from a chair using your arms (e.g., wheelchair or bedside chair)?: None Help needed to walk in hospital room?: None Help needed climbing 3-5 steps with a railing? : A Little 6 Click Score: 23    End of Session   Activity Tolerance: Patient tolerated treatment well;Patient limited by pain Patient left: in bed;with call bell/phone within reach Nurse Communication: Mobility status      Time: 1736-1757 PT Time Calculation (min) (ACUTE ONLY): 21 min   Charges:   PT Evaluation $PT Eval Moderate Complexity: 1 Mod          06/01/2022  Ginger Carne., PT Acute Rehabilitation Services [phone]  (office)  Tessie Fass Judah Carchi 06/01/2022, 6:04 PM

## 2022-06-01 NOTE — Plan of Care (Signed)
Problem: Education: Goal: Knowledge of General Education information will improve Description: Including pain rating scale, medication(s)/side effects and non-pharmacologic comfort measures Outcome: Progressing   Problem: Health Behavior/Discharge Planning: Goal: Ability to manage health-related needs will improve Outcome: Progressing   Problem: Clinical Measurements: Goal: Ability to maintain clinical measurements within normal limits will improve Outcome: Progressing Goal: Will remain free from infection Outcome: Progressing Goal: Diagnostic test results will improve Outcome: Progressing Goal: Respiratory complications will improve Outcome: Progressing Goal: Cardiovascular complication will be avoided Outcome: Progressing   Problem: Activity: Goal: Risk for activity intolerance will decrease Outcome: Progressing   Problem: Nutrition: Goal: Adequate nutrition will be maintained Outcome: Progressing   Problem: Coping: Goal: Level of anxiety will decrease Outcome: Progressing   Problem: Elimination: Goal: Will not experience complications related to bowel motility Outcome: Progressing Goal: Will not experience complications related to urinary retention Outcome: Progressing   Problem: Pain Managment: Goal: General experience of comfort will improve Outcome: Progressing   Problem: Safety: Goal: Ability to remain free from injury will improve Outcome: Progressing   Problem: Skin Integrity: Goal: Risk for impaired skin integrity will decrease Outcome: Progressing   Problem: Education: Goal: Knowledge of General Education information will improve Description: Including pain rating scale, medication(s)/side effects and non-pharmacologic comfort measures Outcome: Progressing   Problem: Health Behavior/Discharge Planning: Goal: Ability to manage health-related needs will improve Outcome: Progressing   Problem: Clinical Measurements: Goal: Ability to maintain  clinical measurements within normal limits will improve Outcome: Progressing Goal: Will remain free from infection Outcome: Progressing Goal: Diagnostic test results will improve Outcome: Progressing Goal: Respiratory complications will improve Outcome: Progressing Goal: Cardiovascular complication will be avoided Outcome: Progressing   Problem: Activity: Goal: Risk for activity intolerance will decrease Outcome: Progressing   Problem: Nutrition: Goal: Adequate nutrition will be maintained Outcome: Progressing   Problem: Coping: Goal: Level of anxiety will decrease Outcome: Progressing   Problem: Elimination: Goal: Will not experience complications related to bowel motility Outcome: Progressing Goal: Will not experience complications related to urinary retention Outcome: Progressing   Problem: Pain Managment: Goal: General experience of comfort will improve Outcome: Progressing   Problem: Safety: Goal: Ability to remain free from injury will improve Outcome: Progressing   Problem: Skin Integrity: Goal: Risk for impaired skin integrity will decrease Outcome: Progressing   Problem: Education: Goal: Understanding of cardiac disease, CV risk reduction, and recovery process will improve Outcome: Progressing Goal: Individualized Educational Video(s) Outcome: Progressing   Problem: Activity: Goal: Ability to tolerate increased activity will improve Outcome: Progressing   Problem: Cardiac: Goal: Ability to achieve and maintain adequate cardiovascular perfusion will improve Outcome: Progressing   Problem: Health Behavior/Discharge Planning: Goal: Ability to safely manage health-related needs after discharge will improve Outcome: Progressing   Problem: Education: Goal: Ability to describe self-care measures that may prevent or decrease complications (Diabetes Survival Skills Education) will improve Outcome: Progressing Goal: Individualized Educational  Video(s) Outcome: Progressing   Problem: Coping: Goal: Ability to adjust to condition or change in health will improve Outcome: Progressing   Problem: Fluid Volume: Goal: Ability to maintain a balanced intake and output will improve Outcome: Progressing   Problem: Health Behavior/Discharge Planning: Goal: Ability to identify and utilize available resources and services will improve Outcome: Progressing Goal: Ability to manage health-related needs will improve Outcome: Progressing   Problem: Metabolic: Goal: Ability to maintain appropriate glucose levels will improve Outcome: Progressing   Problem: Nutritional: Goal: Maintenance of adequate nutrition will improve Outcome: Progressing Goal: Progress toward achieving an optimal weight  will improve Outcome: Progressing   Problem: Skin Integrity: Goal: Risk for impaired skin integrity will decrease Outcome: Progressing

## 2022-06-01 NOTE — Progress Notes (Signed)
PROGRESS NOTE  [name]:[id number] DOB: 1960/11/29 DOA: 05/30/2022 PCP: Shirline Frees, MD  HPI/Recap of past 24 hours: [name] is a 61 y.o. female with medical history significant of asthma, CAD, chronic back pain on opioids, cirrhosis 2/2 NASH, COPD, depression, anxiety, GERD, DM2, obesity, OSA, tobacco use who presents for multiple complaints of pain. Patient reports on the night prior to admission she began "hurting all over, worse around her abdomen for the past few days.  Complained of shortness of breath. She is on oxygen at home, but can't afford to have it all the time.  She was noted to also have weight gain, nausea, and emesis on the day prior to admission. She had chest pain under the left breast which was associated with nausea and sob.  She was requiring increased oxygen to keep saturations > 90 per report in the ED. She was noted to have gained 10 pounds in the outpatient setting.  She has not taken her torsemide.  She notes increased abdominal swelling. CXR showed no acute disease.  EKG showed prolonged QTc (508).  Patient admitted for further management.   Today, patient noted to be in sleeping, just had breakfast.  Sleeps off  mid-sentence, still complaining of abdominal pain.  PT/OT   Assessment/Plan: Active Problems:   Chronic, continuous use of opioids   CAD (coronary artery disease)   Hypertension associated with diabetes ([name])   COPD (chronic obstructive pulmonary disease) ([name])   Morbid obesity ([name])   Chronic low back pain with left-sided sciatica   HLD (hyperlipidemia)   Type 2 diabetes mellitus with obesity ([name])   Depression with anxiety   Chronic abdominal pain   Cirrhosis of liver with ascites ([name])   Ascites   NAFLD (nonalcoholic fatty liver disease)   Chronic prescription benzodiazepine use   Nausea & vomiting   Central chest pain   Central chest pain/Atypical CAD (coronary artery disease) Currently chest pain-free Trend TnI, currently  16 --> 23 EKG with prolonged Qtc Echo showed EF of 70 to 75%, no regional wall motion abnormalities, grade 2 diastolic dysfunction Telemetry  Prolonged Qtc EKG showed prolonged Qtc Avoid QTc prolonging meds, held escitalopram Repeat EKG pending Telemetry  Hypomagnesemia/hypokalemia Replace as needed  Cirrhosis of liver with ascites ([name]) Ascites NAFLD (nonalcoholic fatty liver disease) Pt is not on chronic diuretics, or lasix/spiro which is classic for cirrhosis Ultrasound to assess for ascites showed minimal ascites LFTs minimally elevated Lipase negative CT abdomen/pelvis showed cirrhotic liver, with mild splenomegaly, small volume ascites, noted large amount of stool throughout colon Start laxatives    Hypertension  BP fairly stable Continue amlodipine, benazepril, clonidine  Type 2 diabetes mellitus with obesity ([name]) A1c 6.6 SSI, Accu-Cheks, hypoglycemic protocol Hold metformin  COPD (chronic obstructive pulmonary disease) Asthma No wheezing, CXR clear Saturating well on 2 L of oxygen Continue home therapy of ICS/Laba and PRN albuterol Supplemental O2  Anemia, normocytic Appears to be stable and at baseline Daily CBC    Chronic low back pain with left-sided sciatica Adjust home pain regimen of oxycodone to q 6-8 hours  Reviewed PDMP and she receives 180 tablets of Oxy IR 46m/month    Depression with anxiety Continue home xanax, hydroxyzine, trazodone, held escitalopram due to prolonged QTc      Chronic prescription benzodiazepine use Chronic, continuous use of opioids Continue home medications   Morbid obesity Lifestyle modification advised  Tobacco abuse Advised to quit     Estimated body mass index is  45.35 kg/m as calculated from the following:   Height as of this encounter: 5' 1"  (1.549 m).   Weight as of this encounter: 108.9 kg.     Code Status: Full  Family Communication: None at bedside  Disposition Plan: Status is:  Observation The patient remains OBS appropriate and will d/c before 2 midnights.      Consultants: None  Procedures: None  Antimicrobials: None  DVT prophylaxis: Lovenox   Objective: Vitals:   06/01/22 0046 06/01/22 0543 06/01/22 0803 06/01/22 1118  BP: 133/70 (!) 148/73 130/77 (!) 145/73  Pulse: 90 79 83 82  Resp: 18 16 18 16   Temp: (!) 96.9 F (36.1 C) (!) 96.1 F (35.6 C) 97.9 F (36.6 C) (!) 97.4 F (36.3 C)  TempSrc: Axillary Oral Oral Oral  SpO2: 92% 99% 95% 94%  Weight:      Height:        Intake/Output Summary (Last 24 hours) at 06/01/2022 1613 Last data filed at 05/31/2022 1833 Gross per 24 hour  Intake 5.56 ml  Output --  Net 5.56 ml   Filed Weights   05/30/22 0432  Weight: 108.9 kg    Exam: General: NAD  Cardiovascular: S1, S2 present Respiratory: Diminished breath sounds bilaterally Abdomen: Soft, tender, nondistended, bowel sounds present Musculoskeletal: No bilateral pedal edema noted Skin: Normal Psychiatry: Normal mood     Data Reviewed: CBC: Recent Labs  Lab 05/30/22 0444 05/30/22 0458 05/30/22 2119 05/31/22 0254 06/01/22 0209  WBC 9.2  --  7.3 6.8 7.8  NEUTROABS 6.4  --   --   --  4.7  HGB 10.4* 11.2* 9.8* 9.7* 9.5*  HCT 33.0* 33.0* 31.0* 30.8* 31.3*  MCV 82.1  --  82.9 83.7 84.8  PLT 188  --  153 145* 195   Basic Metabolic Panel: Recent Labs  Lab 05/30/22 0444 05/30/22 0458 05/30/22 2119 05/31/22 0254 06/01/22 0209  NA 134* 136  --  138 137  K 3.7 3.7  --  3.6 3.4*  CL 92*  --   --  95* 94*  CO2 32  --   --  31 35*  GLUCOSE 175*  --   --  194* 186*  BUN 11  --   --  7* 5*  CREATININE 0.78  --  0.90 0.88 0.83  CALCIUM 10.2  --   --  9.4 8.0*  MG  --   --   --  1.3* 1.8   GFR: Estimated Creatinine Clearance: 79.1 mL/min (by C-G formula based on SCr of 0.83 mg/dL). Liver Function Tests: Recent Labs  Lab 05/30/22 0444 05/31/22 0254 06/01/22 0209  AST 42* 45* 35  ALT 21 23 22   ALKPHOS 97 87 79   BILITOT 0.7 0.5 0.6  PROT 9.1* 8.0 7.6  ALBUMIN 4.0 3.0* 2.8*   Recent Labs  Lab 05/30/22 0444  LIPASE 20   No results for input(s): "AMMONIA" in the last 168 hours. Coagulation Profile: No results for input(s): "INR", "PROTIME" in the last 168 hours. Cardiac Enzymes: No results for input(s): "CKTOTAL", "CKMB", "CKMBINDEX", "TROPONINI" in the last 168 hours. BNP (last 3 results) No results for input(s): "PROBNP" in the last 8760 hours. HbA1C: Recent Labs    05/30/22 2119  HGBA1C 6.6*   CBG: Recent Labs  Lab 05/31/22 1704 05/31/22 2127 06/01/22 0640 06/01/22 0808 06/01/22 1115  GLUCAP 154* 139* 209* 228* 258*   Lipid Profile: Recent Labs    05/31/22 0254  CHOL 132  HDL 34*  LDLCALC 75  TRIG 115  CHOLHDL 3.9   Thyroid Function Tests: No results for input(s): "TSH", "T4TOTAL", "FREET4", "T3FREE", "THYROIDAB" in the last 72 hours. Anemia Panel: No results for input(s): "VITAMINB12", "FOLATE", "FERRITIN", "TIBC", "IRON", "RETICCTPCT" in the last 72 hours. Urine analysis:    Component Value Date/Time   COLORURINE YELLOW 03/22/2022 1708   APPEARANCEUR CLEAR 03/22/2022 1708   LABSPEC 1.026 03/22/2022 1708   PHURINE 5.5 03/22/2022 1708   GLUCOSEU NEGATIVE 03/22/2022 1708   HGBUR NEGATIVE 03/22/2022 1708   BILIRUBINUR NEGATIVE 03/22/2022 1708   KETONESUR NEGATIVE 03/22/2022 1708   PROTEINUR 30 (A) 03/22/2022 1708   UROBILINOGEN 0.2 12/09/2014 0840   NITRITE NEGATIVE 03/22/2022 1708   LEUKOCYTESUR NEGATIVE 03/22/2022 1708   Sepsis Labs: @LABRCNTIP (procalcitonin:4,lacticidven:4)  ) Recent Results (from the past 240 hour(s))  Resp Panel by RT-PCR (Flu A&B, Covid) Anterior Nasal Swab     Status: None   Collection Time: 05/30/22  4:44 AM   Specimen: Anterior Nasal Swab  Result Value Ref Range Status   SARS Coronavirus 2 by RT PCR NEGATIVE NEGATIVE Final    Comment: (NOTE) SARS-CoV-2 target nucleic acids are NOT DETECTED.  The SARS-CoV-2 RNA is generally  detectable in upper respiratory specimens during the acute phase of infection. The lowest concentration of SARS-CoV-2 viral copies this assay can detect is 138 copies/mL. A negative result does not preclude SARS-Cov-2 infection and should not be used as the sole basis for treatment or other patient management decisions. A negative result may occur with  improper specimen collection/handling, submission of specimen other than nasopharyngeal swab, presence of viral mutation(s) within the areas targeted by this assay, and inadequate number of viral copies(<138 copies/mL). A negative result must be combined with clinical observations, patient history, and epidemiological information. The expected result is Negative.  Fact Sheet for Patients:  EntrepreneurPulse.com.au  Fact Sheet for Healthcare Providers:  IncredibleEmployment.be  This test is no t yet approved or cleared by the [place] FDA and  has been authorized for detection and/or diagnosis of SARS-CoV-2 by FDA under an Emergency Use Authorization (EUA). This EUA will remain  in effect (meaning this test can be used) for the duration of the COVID-19 declaration under Section 564(b)(1) of the Act, 21 U.S.C.section 360bbb-3(b)(1), unless the authorization is terminated  or revoked sooner.       Influenza A by PCR NEGATIVE NEGATIVE Final   Influenza B by PCR NEGATIVE NEGATIVE Final    Comment: (NOTE) The Xpert Xpress SARS-CoV-2/FLU/RSV plus assay is intended as an aid in the diagnosis of influenza from Nasopharyngeal swab specimens and should not be used as a sole basis for treatment. Nasal washings and aspirates are unacceptable for Xpert Xpress SARS-CoV-2/FLU/RSV testing.  Fact Sheet for Patients: EntrepreneurPulse.com.au  Fact Sheet for Healthcare Providers: IncredibleEmployment.be  This test is not yet approved or cleared by the [place] FDA  and has been authorized for detection and/or diagnosis of SARS-CoV-2 by FDA under an Emergency Use Authorization (EUA). This EUA will remain in effect (meaning this test can be used) for the duration of the COVID-19 declaration under Section 564(b)(1) of the Act, 21 U.S.C. section 360bbb-3(b)(1), unless the authorization is terminated or revoked.  Performed at [facility], [address], [place], [place] [address]       Studies: CT ABDOMEN PELVIS W CONTRAST  Result Date: 05/31/2022 CLINICAL DATA:  Acute abdominal pain. EXAM: CT ABDOMEN AND PELVIS WITH CONTRAST TECHNIQUE: Multidetector CT imaging of the abdomen and pelvis was performed  using the standard protocol following bolus administration of intravenous contrast. RADIATION DOSE REDUCTION: This exam was performed according to the departmental dose-optimization program which includes automated exposure control, adjustment of the mA and/or kV according to patient size and/or use of iterative reconstruction technique. CONTRAST:  49m OMNIPAQUE IOHEXOL 350 MG/ML SOLN COMPARISON:  Abdominal ultrasound 05/30/2022. CT abdomen and pelvis 03/22/2022. FINDINGS: Lower chest: No acute abnormality. Hepatobiliary: Nodular liver contour is again seen compatible with cirrhosis. No focal liver lesions are identified. Gallbladder and bile ducts are within normal limits. Pancreas: Unremarkable. No pancreatic ductal dilatation or surrounding inflammatory changes. Spleen: There are 2 rounded hypodensities in the spleen which are favored as cysts. These measure up to 1.2 cm and appear unchanged from prior. Spleen is mildly enlarged, unchanged. Adrenals/Urinary Tract: There stable rounded hypodensities in the right kidney which are too small to characterize, likely cysts. Otherwise, the kidneys, adrenal glands and bladder are within normal limits. Stomach/Bowel: Stomach is within normal limits. Appendix is not seen. No evidence of bowel wall  thickening, distention, or inflammatory changes. There is a large amount of stool throughout the colon. Vascular/Lymphatic: No significant vascular findings are present. No enlarged abdominal or pelvic lymph nodes. Reproductive: Status post hysterectomy. No adnexal masses. Other: There is no focal abdominal wall hernia. There is small volume ascites. Musculoskeletal: There are bilateral pars interarticularis defects at L5. There is stable grade 1 anterolisthesis of L5 on S1. Degenerative changes affect the spine. IMPRESSION: 1. Cirrhotic liver with mild splenomegaly, unchanged. 2. Small volume ascites. 3. Subcentimeter right Bosniak 2 lesion, too small to characterize. No follow-up imaging is recommended. JACR 2018 Feb; 264-273, Management of the Incidental RenalMass on CT, [employer] 2021; 814-848, Bosniak Classification of Cystic Renal Masses, Version 2019. Electronically Signed   By: ARonney AstersM.D.   On: 05/31/2022 19:16    Scheduled Meds:  ALPRAZolam  1 mg Oral Daily   And   ALPRAZolam  2 mg Oral QHS   alum & mag hydroxide-simeth  30 mL Oral Once   amLODipine  5 mg Oral Daily   benazepril  40 mg Oral Daily   enoxaparin (LOVENOX) injection  40 mg Subcutaneous Q24H   insulin aspart  0-15 Units Subcutaneous TID WC   loratadine  5 mg Oral QPM   mometasone-formoterol  2 puff Inhalation BID   pantoprazole  40 mg Oral BID   polyethylene glycol  17 g Oral BID   potassium chloride  40 mEq Oral BID   senna-docusate  1 tablet Oral BID   simvastatin  20 mg Oral QHS   traZODone  50-100 mg Oral QHS    Continuous Infusions:   LOS: 0 days     NAlma Friendly MD [facility]  If 7PM-7AM, please contact night-coverage [email or web address] 06/01/2022, 4:13 PM

## 2022-06-02 DIAGNOSIS — J45909 Unspecified asthma, uncomplicated: Secondary | ICD-10-CM | POA: Diagnosis not present

## 2022-06-02 DIAGNOSIS — G8929 Other chronic pain: Secondary | ICD-10-CM | POA: Diagnosis not present

## 2022-06-02 DIAGNOSIS — E119 Type 2 diabetes mellitus without complications: Secondary | ICD-10-CM | POA: Diagnosis not present

## 2022-06-02 DIAGNOSIS — R188 Other ascites: Secondary | ICD-10-CM | POA: Diagnosis not present

## 2022-06-02 DIAGNOSIS — R109 Unspecified abdominal pain: Secondary | ICD-10-CM | POA: Diagnosis not present

## 2022-06-02 DIAGNOSIS — I10 Essential (primary) hypertension: Secondary | ICD-10-CM | POA: Diagnosis not present

## 2022-06-02 DIAGNOSIS — R079 Chest pain, unspecified: Secondary | ICD-10-CM | POA: Diagnosis not present

## 2022-06-02 DIAGNOSIS — I2510 Atherosclerotic heart disease of native coronary artery without angina pectoris: Secondary | ICD-10-CM | POA: Diagnosis not present

## 2022-06-02 DIAGNOSIS — Z20822 Contact with and (suspected) exposure to covid-19: Secondary | ICD-10-CM | POA: Diagnosis not present

## 2022-06-02 DIAGNOSIS — J449 Chronic obstructive pulmonary disease, unspecified: Secondary | ICD-10-CM | POA: Diagnosis not present

## 2022-06-02 DIAGNOSIS — Z7984 Long term (current) use of oral hypoglycemic drugs: Secondary | ICD-10-CM | POA: Diagnosis not present

## 2022-06-02 DIAGNOSIS — J9601 Acute respiratory failure with hypoxia: Secondary | ICD-10-CM | POA: Diagnosis not present

## 2022-06-02 DIAGNOSIS — J9602 Acute respiratory failure with hypercapnia: Secondary | ICD-10-CM | POA: Diagnosis not present

## 2022-06-02 DIAGNOSIS — F418 Other specified anxiety disorders: Secondary | ICD-10-CM

## 2022-06-02 DIAGNOSIS — R9431 Abnormal electrocardiogram [ECG] [EKG]: Secondary | ICD-10-CM | POA: Diagnosis not present

## 2022-06-02 DIAGNOSIS — R0789 Other chest pain: Secondary | ICD-10-CM | POA: Diagnosis not present

## 2022-06-02 DIAGNOSIS — F1190 Opioid use, unspecified, uncomplicated: Secondary | ICD-10-CM | POA: Diagnosis not present

## 2022-06-02 DIAGNOSIS — K7460 Unspecified cirrhosis of liver: Secondary | ICD-10-CM | POA: Diagnosis not present

## 2022-06-02 DIAGNOSIS — Z96652 Presence of left artificial knee joint: Secondary | ICD-10-CM | POA: Diagnosis not present

## 2022-06-02 DIAGNOSIS — R0602 Shortness of breath: Secondary | ICD-10-CM | POA: Diagnosis not present

## 2022-06-02 DIAGNOSIS — F17210 Nicotine dependence, cigarettes, uncomplicated: Secondary | ICD-10-CM | POA: Diagnosis not present

## 2022-06-02 DIAGNOSIS — K760 Fatty (change of) liver, not elsewhere classified: Secondary | ICD-10-CM | POA: Diagnosis not present

## 2022-06-02 DIAGNOSIS — E876 Hypokalemia: Secondary | ICD-10-CM | POA: Diagnosis not present

## 2022-06-02 DIAGNOSIS — Z79899 Other long term (current) drug therapy: Secondary | ICD-10-CM | POA: Diagnosis not present

## 2022-06-02 LAB — CBC WITH DIFFERENTIAL/PLATELET
Abs Immature Granulocytes: 0.01 10*3/uL (ref 0.00–0.07)
Basophils Absolute: 0 10*3/uL (ref 0.0–0.1)
Basophils Relative: 0 %
Eosinophils Absolute: 0.3 10*3/uL (ref 0.0–0.5)
Eosinophils Relative: 4 %
HCT: 33.9 % — ABNORMAL LOW (ref 36.0–46.0)
Hemoglobin: 10.5 g/dL — ABNORMAL LOW (ref 12.0–15.0)
Immature Granulocytes: 0 %
Lymphocytes Relative: 23 %
Lymphs Abs: 1.5 10*3/uL (ref 0.7–4.0)
MCH: 25.9 pg — ABNORMAL LOW (ref 26.0–34.0)
MCHC: 31 g/dL (ref 30.0–36.0)
MCV: 83.7 fL (ref 80.0–100.0)
Monocytes Absolute: 0.4 10*3/uL (ref 0.1–1.0)
Monocytes Relative: 6 %
Neutro Abs: 4.6 10*3/uL (ref 1.7–7.7)
Neutrophils Relative %: 67 %
Platelets: 158 10*3/uL (ref 150–400)
RBC: 4.05 MIL/uL (ref 3.87–5.11)
RDW: 15.9 % — ABNORMAL HIGH (ref 11.5–15.5)
WBC: 6.8 10*3/uL (ref 4.0–10.5)
nRBC: 0 % (ref 0.0–0.2)

## 2022-06-02 LAB — MAGNESIUM: Magnesium: 1.9 mg/dL (ref 1.7–2.4)

## 2022-06-02 LAB — KAPPA/LAMBDA LIGHT CHAINS
Kappa free light chain: 84.8 mg/L — ABNORMAL HIGH (ref 3.3–19.4)
Kappa, lambda light chain ratio: 1.74 — ABNORMAL HIGH (ref 0.26–1.65)
Lambda free light chains: 48.7 mg/L — ABNORMAL HIGH (ref 5.7–26.3)

## 2022-06-02 LAB — COMPREHENSIVE METABOLIC PANEL
ALT: 23 U/L (ref 0–44)
AST: 32 U/L (ref 15–41)
Albumin: 3.1 g/dL — ABNORMAL LOW (ref 3.5–5.0)
Alkaline Phosphatase: 91 U/L (ref 38–126)
Anion gap: 7 (ref 5–15)
BUN: 5 mg/dL — ABNORMAL LOW (ref 8–23)
CO2: 31 mmol/L (ref 22–32)
Calcium: 7.9 mg/dL — ABNORMAL LOW (ref 8.9–10.3)
Chloride: 99 mmol/L (ref 98–111)
Creatinine, Ser: 0.81 mg/dL (ref 0.44–1.00)
GFR, Estimated: >60 mL/min (ref >60)
Glucose, Bld: 297 mg/dL — ABNORMAL HIGH (ref 70–99)
Potassium: 4.4 mmol/L (ref 3.5–5.1)
Sodium: 137 mmol/L (ref 135–145)
Total Bilirubin: 0.6 mg/dL (ref 0.3–1.2)
Total Protein: 8.4 g/dL — ABNORMAL HIGH (ref 6.5–8.1)

## 2022-06-02 LAB — GLUCOSE, CAPILLARY
Glucose-Capillary: 260 mg/dL — ABNORMAL HIGH (ref 70–99)
Glucose-Capillary: 231 mg/dL — ABNORMAL HIGH (ref 70–99)

## 2022-06-02 NOTE — Progress Notes (Signed)
SATURATION QUALIFICATIONS: (This note is used to comply with regulatory documentation for home oxygen)  Patient Saturations on Room Air at Rest = 97%  Patient Saturations on Room Air while Ambulating = 92%  Pt ambulated approximately 100 ft while maintaining oxygen saturation > 92% on room air

## 2022-06-02 NOTE — Progress Notes (Signed)
CSW received consult for food and housing resources and transportation assistance. CSW spoke with patient at bedside. Patient reports she comes from home with spouse. Patient reports she is currently renting a house. Patient accepted area shelter resources if ever needed in the future. Patient accepted food resources and confirmed does not need transportation assistance.Patient reports her spouse will come and pick her up when medically ready for dc. All questions answered. No further questions reported at this time.

## 2022-06-02 NOTE — Evaluation (Signed)
Occupational Therapy Evaluation Patient Details Name: [name] MRN: [id number] DOB: [birth year] Today's Date: 06/02/2022   History of Present Illness pt is a 61 y/o female presenting with pain all over associated with difficulty breathing with weight gain, nausea/emesis x up to 24 times and chest pain under her left breast.  Needed increased oxygen to keep sats >90%.  PMHx:  asthma, CAD, chronic back and abdominal pain on opioids, cirrhosis due to NASH, COPD, depression, DM2, obesity, OSA   Clinical Impression   Prior to this admission, patient living with husband who would assist with IADLs, and occasionally provide stand by assist for showering if patient needed. Patient no longer drives, but completes basic ADLs independently. Patient is back at her baseline from an OT perspective, however reported feeling "foggy headed". Patient is cognitively appropriate and no visual deficits noted with assessment (patient stating she was having a hard time seeing the TV but has since cleared). Patient with no acute needs at this time, and no need for OT follow up. OT signing off at this time.      Recommendations for follow up therapy are one component of a multi-disciplinary discharge planning process, led by the attending physician.  Recommendations may be updated based on patient status, additional functional criteria and insurance authorization.   Follow Up Recommendations  No OT follow up    Assistance Recommended at Discharge PRN  Patient can return home with the following      Functional Status Assessment  Patient has had a recent decline in their functional status and demonstrates the ability to make significant improvements in function in a reasonable and predictable amount of time.  Equipment Recommendations  None recommended by OT    Recommendations for Other Services       Precautions / Restrictions Precautions Precautions: None Restrictions Weight Bearing Restrictions: No       Mobility Bed Mobility Overal bed mobility: Modified Independent                  Transfers Overall transfer level: Modified independent                 General transfer comment: standing at EOB upon arrival, moving freely around the room without use of AD      Balance Overall balance assessment: No apparent balance deficits (not formally assessed)                                         ADL either performed or assessed with clinical judgement   ADL Overall ADL's : At baseline                                       General ADL Comments: Patient reports she is at her baseline, and able to demonstrate for OT     Vision Baseline Vision/History: 1 Wears glasses (Readers, cataracts surgery previously) Ability to See in Adequate Light: 0 Adequate Patient Visual Report: No change from baseline       Perception     Praxis      Pertinent Vitals/Pain Pain Assessment Pain Assessment: Faces Faces Pain Scale: Hurts whole lot Pain Location: lower back Pain Descriptors / Indicators: Discomfort, Grimacing, Guarding Pain Intervention(s): Limited activity within patient's tolerance, Monitored during session, Repositioned     Hand Dominance  Extremity/Trunk Assessment Upper Extremity Assessment Upper Extremity Assessment: Overall WFL for tasks assessed   Lower Extremity Assessment Lower Extremity Assessment: Defer to PT evaluation;Overall [facility] for tasks assessed   Cervical / Trunk Assessment Cervical / Trunk Assessment: Kyphotic   Communication Communication Communication: No difficulties   Cognition Arousal/Alertness: Awake/alert Behavior During Therapy: WFL for tasks assessed/performed Overall Cognitive Status: Within Functional Limits for tasks assessed                                       General Comments  VSS on RA    Exercises     Shoulder Instructions      Home Living Family/patient expects  to be discharged to:: Private residence Living Arrangements: Spouse/significant other;Children Available Help at Discharge: Family;Available PRN/intermittently Type of Home: House Home Access: Stairs to enter   Entrance Stairs-Rails: Right;Left Home Layout: One level     Bathroom Shower/Tub: Occupational psychologist: Standard                Prior Functioning/Environment Prior Level of Function : Independent/Modified Independent             Mobility Comments: independent ADLs Comments: some standby assist for showering. Patient no longer drives and husband assists with IADLs        OT Problem List: Pain      OT Treatment/Interventions:      OT Goals(Current goals can be found in the care plan section) Acute Rehab OT Goals Patient Stated Goal: to be in less pain OT Goal Formulation: With patient Time For Goal Achievement: 06/16/22 Potential to Achieve Goals: Good  OT Frequency:      Co-evaluation              AM-PAC OT "6 Clicks" Daily Activity     Outcome Measure Help from another person eating meals?: None Help from another person taking care of personal grooming?: None Help from another person toileting, which includes using toliet, bedpan, or urinal?: None Help from another person bathing (including washing, rinsing, drying)?: None Help from another person to put on and taking off regular upper body clothing?: None Help from another person to put on and taking off regular lower body clothing?: None 6 Click Score: 24   End of Session Nurse Communication: Mobility status  Activity Tolerance: Patient tolerated treatment well Patient left: in bed;with call bell/phone within reach;Other (comment) (NT in room)  OT Visit Diagnosis: Pain Pain - part of body:  (Lower back)                Time: 9758-8325 OT Time Calculation (min): 11 min Charges:  OT General Charges $OT Visit: 1 Visit OT Evaluation $OT Eval Moderate Complexity: 1 Mod  Corinne Ports E. Joey Lierman, OTR/L Acute Rehabilitation Services [phone]   Ascencion Dike 06/02/2022, 8:38 AM

## 2022-06-02 NOTE — Discharge Summary (Signed)
Physician Discharge Summary   Patient: [name] MRN: [id number] DOB: May 11, 1961  Admit date:     05/30/2022  Discharge date: 06/02/22  Discharge Physician: Alma Friendly   PCP: Shirline Frees, MD   Recommendations at discharge:   PCP in 1 week  Discharge Diagnoses: Active Problems:   Chronic, continuous use of opioids   CAD (coronary artery disease)   Hypertension associated with diabetes ([name])   COPD (chronic obstructive pulmonary disease) ([name])   Morbid obesity ([name])   Chronic low back pain with left-sided sciatica   HLD (hyperlipidemia)   Type 2 diabetes mellitus with obesity ([name])   Depression with anxiety   Chronic abdominal pain   Cirrhosis of liver with ascites ([name])   Ascites   NAFLD (nonalcoholic fatty liver disease)   Chronic prescription benzodiazepine use   Nausea & vomiting   Central chest pain    Hospital Course: [name] is a 61 y.o. female with medical history significant of asthma, CAD, chronic back pain on opioids, cirrhosis 2/2 NASH, COPD, depression, anxiety, GERD, DM2, obesity, OSA, tobacco use who presents for multiple complaints of pain. Patient reports on the night prior to admission she began "hurting all over, worse around her abdomen for the past few days.  Complained of shortness of breath. She is on oxygen at home, but can't afford to have it all the time.  She was noted to also have weight gain, nausea, and emesis on the day prior to admission. She had chest pain under the left breast which was associated with nausea and sob.  She was requiring increased oxygen to keep saturations > 90 per report in the ED. She was noted to have gained 10 pounds in the outpatient setting.  She has not taken her torsemide.  She notes increased abdominal swelling. CXR showed no acute disease.  EKG showed prolonged QTc (508).  Patient admitted for further management.    Assessment and Plan:  Central chest pain/Atypical CAD (coronary artery  disease) Currently chest pain-free Trend TnI, currently 16 --> 23 EKG with prolonged Qtc Echo showed EF of 70 to 75%, no regional wall motion abnormalities, grade 2 diastolic dysfunction Follow-up with PCP   Prolonged Qtc EKG showed prolonged Qtc Avoid QTc prolonging meds Repeat EKG with normalized QTc   Hypomagnesemia/hypokalemia Replaced as needed   Cirrhosis of liver with ascites ([name]) Ascites NAFLD (nonalcoholic fatty liver disease) Pt is not on chronic diuretics, or lasix/spiro which is classic for cirrhosis Ultrasound to assess for ascites showed minimal ascites LFTs minimally elevated Lipase negative CT abdomen/pelvis showed cirrhotic liver, with mild splenomegaly, small volume ascites, noted large amount of stool throughout colon Continue PTA laxatives    Hypertension  BP fairly stable Continue amlodipine, benazepril, clonidine   Type 2 diabetes mellitus with obesity ([name]) A1c 6.6 Continue home regimen   COPD (chronic obstructive pulmonary disease) Asthma No wheezing, CXR clear Saturating well on 2 L of oxygen Continue home therapy of ICS/Laba and PRN albuterol Does not qualify for home O2, as saturations remained above 90% on ambulation   Anemia, normocytic Appears to be stable and at baseline Daily CBC    Chronic low back pain with left-sided sciatica Continue home pain regimen of oxycodone 8 hours as this was recently adjusted to prevent oversedation Reviewed PDMP and she receives 180 tablets of Oxy IR 38m/month    Depression with anxiety Continue home xanax, hydroxyzine, trazodone, escitalopram (PCP/psych to consider switching this medication)  Chronic prescription benzodiazepine use Chronic, continuous use of opioids Continue home medications   Morbid obesity Lifestyle modification advised   Tobacco abuse Advised to quit       Consultants: None Procedures performed: None Disposition: Home Diet recommendation:  Cardiac and Carb  modified diet    DISCHARGE MEDICATION: Allergies as of 06/02/2022       Reactions   Spironolactone Nausea Only   nausea   Gabapentin Other (See Comments)   sleepwalking   Sulfamethoxazole-trimethoprim Hives   Amoxicillin Nausea And Vomiting, Other (See Comments)   tolerated augmentin in the past (03/2013) without issue. Did it involve swelling of the face/tongue/throat, SOB, or low BP? No Did it involve sudden or severe rash/hives, skin peeling, or any reaction on the inside of your mouth or nose? No Did you need to seek medical attention at a hospital or doctor's office? No When did it last happen? unk       If all above answers are "NO", may proceed with cephalosporin use.   Clindamycin/lincomycin Nausea And Vomiting   Doxycycline Nausea And Vomiting   Treximet [sumatriptan-naproxen Sodium] Nausea And Vomiting   Bactrim Hives, Rash        Medication List     STOP taking these medications    cefdinir 300 MG capsule Commonly known as: OMNICEF   ciprofloxacin 500 MG tablet Commonly known as: CIPRO   metroNIDAZOLE 500 MG tablet Commonly known as: FLAGYL   naproxen sodium 220 MG tablet Commonly known as: ALEVE   ondansetron 4 MG tablet Commonly known as: ZOFRAN       TAKE these medications    acetaminophen 500 MG tablet Commonly known as: TYLENOL Take 1,000 mg by mouth every 6 (six) hours as needed for moderate pain.   albuterol (2.5 MG/3ML) 0.083% nebulizer solution Commonly known as: PROVENTIL Take 2.5 mg by nebulization every 6 (six) hours as needed for wheezing or shortness of breath.   albuterol 108 (90 Base) MCG/ACT inhaler Commonly known as: VENTOLIN HFA Inhale 2 puffs into the lungs every 4 (four) hours as needed for wheezing or shortness of breath.   ALPRAZolam 1 MG tablet Commonly known as: XANAX Take 2 mg by mouth at bedtime.   amLODipine 5 MG tablet Commonly known as: NORVASC TAKE 1 TABLET BY MOUTH IN THE MORNING AND AT BEDTIME What  changed: See the new instructions.   benazepril 40 MG tablet Commonly known as: LOTENSIN Take 40 mg by mouth daily.   cloNIDine 0.1 MG tablet Commonly known as: CATAPRES Take 0.2 mg by mouth 2 (two) times daily as needed (blood pressure).   docusate sodium 250 MG capsule Commonly known as: COLACE Take 250 mg by mouth daily as needed for constipation.   escitalopram 20 MG tablet Commonly known as: LEXAPRO Take 20 mg by mouth every evening.   estradiol 2 MG tablet Commonly known as: ESTRACE Take 2 mg by mouth daily.   fluticasone 50 MCG/ACT nasal spray Commonly known as: FLONASE Place 1 spray into both nostrils daily as needed for allergies.   hydrOXYzine 25 MG tablet Commonly known as: ATARAX Take 25 mg by mouth every 8 (eight) hours as needed for anxiety.   levocetirizine 5 MG tablet Commonly known as: XYZAL Take 5 mg by mouth every evening.   lidocaine 5 % ointment Commonly known as: XYLOCAINE Apply 1 application topically 2 (two) times daily as needed (hemorrhoids).   meloxicam 15 MG tablet Commonly known as: MOBIC Take 15 mg by mouth daily as  needed for pain.   metFORMIN 500 MG tablet Commonly known as: GLUCOPHAGE Take 2 tablets (1,000 mg total) by mouth 2 (two) times daily with a meal. What changed:  how much to take when to take this reasons to take this   Narcan 4 MG/0.1ML Liqd nasal spray kit Generic drug: naloxone Place 1 spray into the nose as needed (for overdose).   nystatin powder Commonly known as: MYCOSTATIN/NYSTOP Apply 1 g topically daily as needed (irritation. (Typically during Summer months)).   oxyCODONE 15 MG immediate release tablet Commonly known as: ROXICODONE Take 1 tablet (15 mg total) by mouth every 8 (eight) hours as needed for pain. What changed: when to take this   pantoprazole 40 MG tablet Commonly known as: PROTONIX Take 40 mg by mouth 2 (two) times daily.   Phazyme Maximum Strength 250 MG Caps Generic drug:  Simethicone Take 250 mg by mouth 2 (two) times daily as needed (gas relief).   polyethylene glycol 17 g packet Commonly known as: MIRALAX / GLYCOLAX Take 17 g by mouth daily as needed for moderate constipation.   psyllium 58.6 % packet Commonly known as: METAMUCIL Take 1 packet by mouth daily as needed (regularity).   REFRESH DRY EYE THERAPY OP Place 1 drop into both eyes daily as needed (dry eyes).   simvastatin 20 MG tablet Commonly known as: ZOCOR Take 20 mg by mouth at bedtime.   Symbicort 160-4.5 MCG/ACT inhaler Generic drug: budesonide-formoterol Inhale 2 puffs into the lungs in the morning and at bedtime.   torsemide 20 MG tablet Commonly known as: DEMADEX Take 40 mg by mouth daily as needed (fluid).   traZODone 50 MG tablet Commonly known as: DESYREL Take 50 mg by mouth at bedtime.   triamcinolone cream 0.1 % Commonly known as: KENALOG Apply 1 application topically 2 (two) times daily as needed (rash).   VAGISIL EX Apply 1 application topically daily.        Follow-up Information     Shirline Frees, MD. Schedule an appointment as soon as possible for a visit in 1 week(s).   Specialty: Family Medicine Contact information: [place] [address] [phone]         Sueanne Margarita, MD .   Specialty: Cardiology Contact information: [phone] [address] [place] [address] [phone]                Discharge Exam: Danley Danker Weights   05/30/22 0432  Weight: 108.9 kg   General: NAD, obese Cardiovascular: S1, S2 present Respiratory: CTAB Abdomen: Soft, nontender, nondistended, bowel sounds present Musculoskeletal: No bilateral pedal edema noted Skin: Normal Psychiatry: Normal mood   Condition at discharge: stable  The results of significant diagnostics from this hospitalization (including imaging, microbiology, ancillary and laboratory) are listed below for reference.   Imaging Studies: CT  ABDOMEN PELVIS W CONTRAST  Result Date: 05/31/2022 CLINICAL DATA:  Acute abdominal pain. EXAM: CT ABDOMEN AND PELVIS WITH CONTRAST TECHNIQUE: Multidetector CT imaging of the abdomen and pelvis was performed using the standard protocol following bolus administration of intravenous contrast. RADIATION DOSE REDUCTION: This exam was performed according to the departmental dose-optimization program which includes automated exposure control, adjustment of the mA and/or kV according to patient size and/or use of iterative reconstruction technique. CONTRAST:  6m OMNIPAQUE IOHEXOL 350 MG/ML SOLN COMPARISON:  Abdominal ultrasound 05/30/2022. CT abdomen and pelvis 03/22/2022. FINDINGS: Lower chest: No acute abnormality. Hepatobiliary: Nodular liver contour is again seen compatible with  cirrhosis. No focal liver lesions are identified. Gallbladder and bile ducts are within normal limits. Pancreas: Unremarkable. No pancreatic ductal dilatation or surrounding inflammatory changes. Spleen: There are 2 rounded hypodensities in the spleen which are favored as cysts. These measure up to 1.2 cm and appear unchanged from prior. Spleen is mildly enlarged, unchanged. Adrenals/Urinary Tract: There stable rounded hypodensities in the right kidney which are too small to characterize, likely cysts. Otherwise, the kidneys, adrenal glands and bladder are within normal limits. Stomach/Bowel: Stomach is within normal limits. Appendix is not seen. No evidence of bowel wall thickening, distention, or inflammatory changes. There is a large amount of stool throughout the colon. Vascular/Lymphatic: No significant vascular findings are present. No enlarged abdominal or pelvic lymph nodes. Reproductive: Status post hysterectomy. No adnexal masses. Other: There is no focal abdominal wall hernia. There is small volume ascites. Musculoskeletal: There are bilateral pars interarticularis defects at L5. There is stable grade 1 anterolisthesis of L5 on  S1. Degenerative changes affect the spine. IMPRESSION: 1. Cirrhotic liver with mild splenomegaly, unchanged. 2. Small volume ascites. 3. Subcentimeter right Bosniak 2 lesion, too small to characterize. No follow-up imaging is recommended. JACR 2018 Feb; 264-273, Management of the Incidental RenalMass on CT, [employer] 2021; [id number], Bosniak Classification of Cystic Renal Masses, Version 2019. Electronically Signed   By: Ronney Asters M.D.   On: 05/31/2022 19:16   ECHOCARDIOGRAM COMPLETE  Result Date: 05/31/2022    ECHOCARDIOGRAM REPORT   Patient Name:   DILIA ALEMANY Date of Exam: 05/31/2022 Medical Rec #:  [id number]    Height:       61.0 in Accession #:    [id number]   Weight:       240.0 lb Date of Birth:  1961/03/19    BSA:          2.041 m Patient Age:    29 years     BP:           149/75 mmHg Patient Gender: F            HR:           95 bpm. Exam Location:  Inpatient Procedure: 2D Echo, Cardiac Doppler and Color Doppler Indications:    Chest Pain  History:        Patient has no prior history of Echocardiogram examinations.                 CAD, COPD; Risk Factors:Diabetes, Hypertension, Dyslipidemia and                 Sleep Apnea.  Sonographer:    Jefferey Pica Referring Phys: [place]  1. Left ventricular ejection fraction, by estimation, is 70 to 75%. The left ventricle has hyperdynamic function. The left ventricle has no regional wall motion abnormalities. Left ventricular diastolic parameters are consistent with Grade II diastolic dysfunction (pseudonormalization). Elevated left atrial pressure.  2. Right ventricular systolic function is normal. The right ventricular size is normal. There is mildly elevated pulmonary artery systolic pressure.  3. Left atrial size was mildly dilated.  4. The mitral valve is normal in structure. No evidence of mitral valve regurgitation. No evidence of mitral stenosis.  5. The aortic valve is normal in structure. Aortic valve regurgitation is  not visualized. Aortic valve sclerosis is present, with no evidence of aortic valve stenosis.  6. The inferior vena cava is normal in size with greater than 50% respiratory variability, suggesting right atrial pressure of 3  mmHg. FINDINGS  Left Ventricle: Left ventricular ejection fraction, by estimation, is 70 to 75%. The left ventricle has hyperdynamic function. The left ventricle has no regional wall motion abnormalities. The left ventricular internal cavity size was normal in size. There is no left ventricular hypertrophy. Left ventricular diastolic parameters are consistent with Grade II diastolic dysfunction (pseudonormalization). Elevated left atrial pressure. Right Ventricle: The right ventricular size is normal. No increase in right ventricular wall thickness. Right ventricular systolic function is normal. There is mildly elevated pulmonary artery systolic pressure. The tricuspid regurgitant velocity is 2.99  m/s, and with an assumed right atrial pressure of 5 mmHg, the estimated right ventricular systolic pressure is 83.3 mmHg. Left Atrium: Left atrial size was mildly dilated. Right Atrium: Right atrial size was normal in size. Pericardium: There is no evidence of pericardial effusion. Mitral Valve: The mitral valve is normal in structure. No evidence of mitral valve regurgitation. No evidence of mitral valve stenosis. Tricuspid Valve: The tricuspid valve is normal in structure. Tricuspid valve regurgitation is not demonstrated. No evidence of tricuspid stenosis. Aortic Valve: Increased aortic gradients are due to hyperdynamic state. The aortic valve is normal in structure. Aortic valve regurgitation is not visualized. Aortic valve sclerosis is present, with no evidence of aortic valve stenosis. Aortic valve mean  gradient measures 10.6 mmHg. Aortic valve peak gradient measures 21.2 mmHg. Aortic valve area, by VTI measures 2.31 cm. Pulmonic Valve: The pulmonic valve was normal in structure. Pulmonic valve  regurgitation is not visualized. No evidence of pulmonic stenosis. Aorta: The aortic root is normal in size and structure. Venous: The inferior vena cava is normal in size with greater than 50% respiratory variability, suggesting right atrial pressure of 3 mmHg. IAS/Shunts: No atrial level shunt detected by color flow Doppler.  LEFT VENTRICLE PLAX 2D LVIDd:         5.30 cm   Diastology LVIDs:         2.70 cm   LV e' medial:    4.80 cm/s LV PW:         1.10 cm   LV E/e' medial:  24.6 LV IVS:        1.00 cm   LV e' lateral:   7.00 cm/s LVOT diam:     1.90 cm   LV E/e' lateral: 16.9 LV SV:         98 LV SV Index:   48 LVOT Area:     2.84 cm  RIGHT VENTRICLE             IVC RV S prime:     14.60 cm/s  IVC diam: 1.50 cm TAPSE (M-mode): 2.3 cm LEFT ATRIUM             Index        RIGHT ATRIUM           Index LA diam:        4.10 cm 2.01 cm/m   RA Area:     15.50 cm LA Vol (A2C):   72.0 ml 35.28 ml/m  RA Volume:   37.70 ml  18.47 ml/m LA Vol (A4C):   73.2 ml 35.87 ml/m LA Biplane Vol: 79.9 ml 39.15 ml/m  AORTIC VALVE                     PULMONIC VALVE AV Area (Vmax):    2.27 cm      PV Vmax:       1.27 m/s AV Area (  Vmean):   2.07 cm      PV Peak grad:  6.5 mmHg AV Area (VTI):     2.31 cm AV Vmax:           230.17 cm/s AV Vmean:          145.424 cm/s AV VTI:            0.425 m AV Peak Grad:      21.2 mmHg AV Mean Grad:      10.6 mmHg LVOT Vmax:         184.00 cm/s LVOT Vmean:        106.000 cm/s LVOT VTI:          0.347 m LVOT/AV VTI ratio: 0.82  AORTA Ao Root diam: 2.90 cm Ao Asc diam:  2.50 cm MITRAL VALVE                TRICUSPID VALVE MV Area (PHT): 3.99 cm     TR Peak grad:   35.8 mmHg MV Decel Time: 190 msec     TR Vmax:        299.00 cm/s MV E velocity: 118.00 cm/s MV A velocity: 81.90 cm/s   SHUNTS MV E/A ratio:  1.44         Systemic VTI:  0.35 m                             Systemic Diam: 1.90 cm Dani Gobble Croitoru MD Electronically signed by Sanda Klein MD Signature Date/Time: 05/31/2022/2:56:33 PM     Final    [place] ASCITES (ABDOMEN LIMITED)  Result Date: 05/30/2022 CLINICAL DATA:  Evaluate for ascites, history of cirrhosis EXAM: LIMITED ABDOMEN ULTRASOUND FOR ASCITES TECHNIQUE: Limited ultrasound survey for ascites was performed in all four abdominal quadrants. COMPARISON:  03/22/2022 CT FINDINGS: Minimal ascites is noted primarily right abdomen. IMPRESSION: Minimal ascites.  No sizable window for paracentesis is noted. Electronically Signed   By: Inez Catalina M.D.   On: 05/30/2022 22:25   DG Chest Portable 1 View  Result Date: 05/30/2022 CLINICAL DATA:  Shortness of breath, vomiting. EXAM: PORTABLE CHEST 1 VIEW COMPARISON:  02/08/2021. FINDINGS: The heart size and mediastinal contours are stable. No consolidation, effusion, or pneumothorax. No acute osseous abnormality. IMPRESSION: No active disease. Electronically Signed   By: Brett Fairy M.D.   On: 05/30/2022 05:00    Microbiology: Results for orders placed or performed during the hospital encounter of 05/30/22  Resp Panel by RT-PCR (Flu A&B, Covid) Anterior Nasal Swab     Status: None   Collection Time: 05/30/22  4:44 AM   Specimen: Anterior Nasal Swab  Result Value Ref Range Status   SARS Coronavirus 2 by RT PCR NEGATIVE NEGATIVE Final    Comment: (NOTE) SARS-CoV-2 target nucleic acids are NOT DETECTED.  The SARS-CoV-2 RNA is generally detectable in upper respiratory specimens during the acute phase of infection. The lowest concentration of SARS-CoV-2 viral copies this assay can detect is 138 copies/mL. A negative result does not preclude SARS-Cov-2 infection and should not be used as the sole basis for treatment or other patient management decisions. A negative result may occur with  improper specimen collection/handling, submission of specimen other than nasopharyngeal swab, presence of viral mutation(s) within the areas targeted by this assay, and inadequate number of viral copies(<138 copies/mL). A negative result must be  combined with clinical observations, patient history, and epidemiological information. The expected result is Negative.  Fact Sheet for Patients:  EntrepreneurPulse.com.au  Fact Sheet for Healthcare Providers:  IncredibleEmployment.be  This test is no t yet approved or cleared by the [place] FDA and  has been authorized for detection and/or diagnosis of SARS-CoV-2 by FDA under an Emergency Use Authorization (EUA). This EUA will remain  in effect (meaning this test can be used) for the duration of the COVID-19 declaration under Section 564(b)(1) of the Act, 21 U.S.C.section 360bbb-3(b)(1), unless the authorization is terminated  or revoked sooner.       Influenza A by PCR NEGATIVE NEGATIVE Final   Influenza B by PCR NEGATIVE NEGATIVE Final    Comment: (NOTE) The Xpert Xpress SARS-CoV-2/FLU/RSV plus assay is intended as an aid in the diagnosis of influenza from Nasopharyngeal swab specimens and should not be used as a sole basis for treatment. Nasal washings and aspirates are unacceptable for Xpert Xpress SARS-CoV-2/FLU/RSV testing.  Fact Sheet for Patients: EntrepreneurPulse.com.au  Fact Sheet for Healthcare Providers: IncredibleEmployment.be  This test is not yet approved or cleared by the [place] FDA and has been authorized for detection and/or diagnosis of SARS-CoV-2 by FDA under an Emergency Use Authorization (EUA). This EUA will remain in effect (meaning this test can be used) for the duration of the COVID-19 declaration under Section 564(b)(1) of the Act, 21 U.S.C. section 360bbb-3(b)(1), unless the authorization is terminated or revoked.  Performed at [facility], [address] [place], [place] [address]     Labs: CBC: Recent Labs  Lab 05/30/22 0444 05/30/22 0458 05/30/22 2119 05/31/22 0254 06/01/22 0209 06/02/22 0615  WBC 9.2  --  7.3 6.8 7.8 6.8   NEUTROABS 6.4  --   --   --  4.7 4.6  HGB 10.4* 11.2* 9.8* 9.7* 9.5* 10.5*  HCT 33.0* 33.0* 31.0* 30.8* 31.3* 33.9*  MCV 82.1  --  82.9 83.7 84.8 83.7  PLT 188  --  153 145* 152 726   Basic Metabolic Panel: Recent Labs  Lab 05/30/22 0444 05/30/22 0458 05/30/22 2119 05/31/22 0254 06/01/22 0209 06/02/22 0615  NA 134* 136  --  138 137 137  K 3.7 3.7  --  3.6 3.4* 4.4  CL 92*  --   --  95* 94* 99  CO2 32  --   --  31 35* 31  GLUCOSE 175*  --   --  194* 186* 297*  BUN 11  --   --  7* 5* 5*  CREATININE 0.78  --  0.90 0.88 0.83 0.81  CALCIUM 10.2  --   --  9.4 8.0* 7.9*  MG  --   --   --  1.3* 1.8 1.9   Liver Function Tests: Recent Labs  Lab 05/30/22 0444 05/31/22 0254 06/01/22 0209 06/02/22 0615  AST 42* 45* 35 32  ALT _0 ALKPHOS 97 87 79 91  BILITOT 0.7 0.5 0.6 0.6  PROT 9.1* 8.0 7.6 8.4*  ALBUMIN 4.0 3.0* 2.8* 3.1*   CBG: Recent Labs  Lab 06/01/22 1115 06/01/22 1704 06/01/22 2108 06/02/22 0808 06/02/22 1227  GLUCAP 258* 193* 201* 260* 231*    Discharge time spent: less than 30 minutes.  Signed: Alma Friendly, MD [facility] 06/02/2022

## 2022-06-02 NOTE — Progress Notes (Signed)
Mobility Specialist - Progress Note   06/02/22 1105  Mobility  Activity Ambulated with assistance in hallway  Level of Assistance Standby assist, set-up cues, supervision of patient - no hands on  Assistive Device None  Distance Ambulated (ft) 230 ft  Activity Response Tolerated fair  $Mobility charge 1 Mobility   Pt was received in room and agreeable to mobility. Session was limited due to c/o soreness in lower extremities. Pt was returned to bed with all needs met.   Larey Seat

## 2022-06-02 NOTE — Care Management (Addendum)
1202 06-02-22 Patient had questions regarding CPAP machine for home. Patient states she had an old CPAP and it had broken and thrown away. Patient states she has not had a sleep study within the last 15 years. Case Manager did send a secure chat to the provider to see if Cardiology can follow up for an outpatient sleep study. No further needs identified at this time.   1237 Case Manager spoke with MD regarding above information. Ambulatory sat completed by staff RN and the patient did not drop below 88% to qualify for home oxygen. Patient will need to follow up with her PCP for oxygen and CPAP needs. No further needs from Case Manager at this time.

## 2022-06-03 LAB — PROTEIN ELECTROPHORESIS, SERUM
A/G Ratio: 0.7 (ref 0.7–1.7)
Albumin ELP: 3.2 g/dL (ref 2.9–4.4)
Alpha-1-Globulin: 0.2 g/dL (ref 0.0–0.4)
Alpha-2-Globulin: 0.6 g/dL (ref 0.4–1.0)
Beta Globulin: 1.2 g/dL (ref 0.7–1.3)
Gamma Globulin: 2.4 g/dL — ABNORMAL HIGH (ref 0.4–1.8)
Globulin, Total: 4.5 g/dL — ABNORMAL HIGH (ref 2.2–3.9)
Total Protein ELP: 7.7 g/dL (ref 6.0–8.5)

## 2022-06-10 DIAGNOSIS — R109 Unspecified abdominal pain: Secondary | ICD-10-CM | POA: Diagnosis not present

## 2022-06-10 DIAGNOSIS — M542 Cervicalgia: Secondary | ICD-10-CM | POA: Diagnosis not present

## 2022-06-10 DIAGNOSIS — G8929 Other chronic pain: Secondary | ICD-10-CM | POA: Diagnosis not present

## 2022-06-10 DIAGNOSIS — M545 Low back pain, unspecified: Secondary | ICD-10-CM | POA: Diagnosis not present

## 2022-06-10 DIAGNOSIS — K7460 Unspecified cirrhosis of liver: Secondary | ICD-10-CM | POA: Diagnosis not present

## 2022-06-10 DIAGNOSIS — Z79899 Other long term (current) drug therapy: Secondary | ICD-10-CM | POA: Diagnosis not present

## 2022-06-12 DIAGNOSIS — Z79899 Other long term (current) drug therapy: Secondary | ICD-10-CM | POA: Diagnosis not present

## 2022-06-18 ENCOUNTER — Other Ambulatory Visit: Payer: Self-pay | Admitting: Family Medicine

## 2022-06-18 DIAGNOSIS — Z1231 Encounter for screening mammogram for malignant neoplasm of breast: Secondary | ICD-10-CM

## 2022-07-11 DIAGNOSIS — Z79899 Other long term (current) drug therapy: Secondary | ICD-10-CM | POA: Diagnosis not present

## 2022-07-11 DIAGNOSIS — R109 Unspecified abdominal pain: Secondary | ICD-10-CM | POA: Diagnosis not present

## 2022-07-11 DIAGNOSIS — M542 Cervicalgia: Secondary | ICD-10-CM | POA: Diagnosis not present

## 2022-07-11 DIAGNOSIS — G8929 Other chronic pain: Secondary | ICD-10-CM | POA: Diagnosis not present

## 2022-07-11 DIAGNOSIS — M545 Low back pain, unspecified: Secondary | ICD-10-CM | POA: Diagnosis not present

## 2022-07-11 DIAGNOSIS — K7460 Unspecified cirrhosis of liver: Secondary | ICD-10-CM | POA: Diagnosis not present

## 2022-07-15 DIAGNOSIS — Z79899 Other long term (current) drug therapy: Secondary | ICD-10-CM | POA: Diagnosis not present

## 2022-07-17 DIAGNOSIS — I10 Essential (primary) hypertension: Secondary | ICD-10-CM | POA: Diagnosis not present

## 2022-07-17 DIAGNOSIS — J449 Chronic obstructive pulmonary disease, unspecified: Secondary | ICD-10-CM | POA: Diagnosis not present

## 2022-07-17 DIAGNOSIS — Z0000 Encounter for general adult medical examination without abnormal findings: Secondary | ICD-10-CM | POA: Diagnosis not present

## 2022-07-17 DIAGNOSIS — K7460 Unspecified cirrhosis of liver: Secondary | ICD-10-CM | POA: Diagnosis not present

## 2022-07-17 DIAGNOSIS — E1169 Type 2 diabetes mellitus with other specified complication: Secondary | ICD-10-CM | POA: Diagnosis not present

## 2022-07-17 DIAGNOSIS — K7581 Nonalcoholic steatohepatitis (NASH): Secondary | ICD-10-CM | POA: Diagnosis not present

## 2022-07-17 DIAGNOSIS — J45909 Unspecified asthma, uncomplicated: Secondary | ICD-10-CM | POA: Diagnosis not present

## 2022-07-17 DIAGNOSIS — G4733 Obstructive sleep apnea (adult) (pediatric): Secondary | ICD-10-CM | POA: Diagnosis not present

## 2022-07-17 DIAGNOSIS — E119 Type 2 diabetes mellitus without complications: Secondary | ICD-10-CM | POA: Diagnosis not present

## 2022-07-18 DIAGNOSIS — J449 Chronic obstructive pulmonary disease, unspecified: Secondary | ICD-10-CM | POA: Diagnosis not present

## 2022-07-23 ENCOUNTER — Ambulatory Visit: Payer: Medicaid Other

## 2022-08-08 DIAGNOSIS — M545 Low back pain, unspecified: Secondary | ICD-10-CM | POA: Diagnosis not present

## 2022-08-08 DIAGNOSIS — Z79899 Other long term (current) drug therapy: Secondary | ICD-10-CM | POA: Diagnosis not present

## 2022-08-08 DIAGNOSIS — R109 Unspecified abdominal pain: Secondary | ICD-10-CM | POA: Diagnosis not present

## 2022-08-08 DIAGNOSIS — K7460 Unspecified cirrhosis of liver: Secondary | ICD-10-CM | POA: Diagnosis not present

## 2022-08-08 DIAGNOSIS — G8929 Other chronic pain: Secondary | ICD-10-CM | POA: Diagnosis not present

## 2022-08-08 DIAGNOSIS — M542 Cervicalgia: Secondary | ICD-10-CM | POA: Diagnosis not present

## 2022-08-11 ENCOUNTER — Encounter (HOSPITAL_BASED_OUTPATIENT_CLINIC_OR_DEPARTMENT_OTHER): Payer: Self-pay | Admitting: Emergency Medicine

## 2022-08-11 ENCOUNTER — Encounter (HOSPITAL_COMMUNITY): Payer: Self-pay

## 2022-08-11 ENCOUNTER — Emergency Department (HOSPITAL_BASED_OUTPATIENT_CLINIC_OR_DEPARTMENT_OTHER): Payer: Medicare Other

## 2022-08-11 ENCOUNTER — Other Ambulatory Visit: Payer: Self-pay

## 2022-08-11 ENCOUNTER — Inpatient Hospital Stay (HOSPITAL_BASED_OUTPATIENT_CLINIC_OR_DEPARTMENT_OTHER)
Admission: EM | Admit: 2022-08-11 | Discharge: 2022-08-16 | DRG: 871 | Disposition: A | Discharge status: Home / Self Care | Payer: Medicare Other | Attending: Internal Medicine | Admitting: Internal Medicine

## 2022-08-11 DIAGNOSIS — Z880 Allergy status to penicillin: Secondary | ICD-10-CM

## 2022-08-11 DIAGNOSIS — F17210 Nicotine dependence, cigarettes, uncomplicated: Secondary | ICD-10-CM | POA: Diagnosis present

## 2022-08-11 DIAGNOSIS — Z882 Allergy status to sulfonamides status: Secondary | ICD-10-CM

## 2022-08-11 DIAGNOSIS — Z881 Allergy status to other antibiotic agents status: Secondary | ICD-10-CM

## 2022-08-11 DIAGNOSIS — Z720 Tobacco use: Secondary | ICD-10-CM | POA: Diagnosis present

## 2022-08-11 DIAGNOSIS — R188 Other ascites: Secondary | ICD-10-CM | POA: Diagnosis present

## 2022-08-11 DIAGNOSIS — J440 Chronic obstructive pulmonary disease with acute lower respiratory infection: Secondary | ICD-10-CM | POA: Diagnosis present

## 2022-08-11 DIAGNOSIS — K7460 Unspecified cirrhosis of liver: Secondary | ICD-10-CM | POA: Diagnosis present

## 2022-08-11 DIAGNOSIS — Z8249 Family history of ischemic heart disease and other diseases of the circulatory system: Secondary | ICD-10-CM | POA: Diagnosis not present

## 2022-08-11 DIAGNOSIS — F32A Depression, unspecified: Secondary | ICD-10-CM | POA: Diagnosis present

## 2022-08-11 DIAGNOSIS — J9611 Chronic respiratory failure with hypoxia: Secondary | ICD-10-CM | POA: Diagnosis present

## 2022-08-11 DIAGNOSIS — K5900 Constipation, unspecified: Secondary | ICD-10-CM | POA: Diagnosis present

## 2022-08-11 DIAGNOSIS — I152 Hypertension secondary to endocrine disorders: Secondary | ICD-10-CM | POA: Diagnosis present

## 2022-08-11 DIAGNOSIS — Z6841 Body Mass Index (BMI) 40.0 and over, adult: Secondary | ICD-10-CM | POA: Diagnosis not present

## 2022-08-11 DIAGNOSIS — R911 Solitary pulmonary nodule: Secondary | ICD-10-CM | POA: Diagnosis not present

## 2022-08-11 DIAGNOSIS — Z823 Family history of stroke: Secondary | ICD-10-CM

## 2022-08-11 DIAGNOSIS — E1169 Type 2 diabetes mellitus with other specified complication: Secondary | ICD-10-CM | POA: Diagnosis present

## 2022-08-11 DIAGNOSIS — Z79891 Long term (current) use of opiate analgesic: Secondary | ICD-10-CM | POA: Diagnosis not present

## 2022-08-11 DIAGNOSIS — K7581 Nonalcoholic steatohepatitis (NASH): Secondary | ICD-10-CM | POA: Diagnosis present

## 2022-08-11 DIAGNOSIS — R079 Chest pain, unspecified: Secondary | ICD-10-CM | POA: Diagnosis not present

## 2022-08-11 DIAGNOSIS — M79604 Pain in right leg: Secondary | ICD-10-CM | POA: Diagnosis present

## 2022-08-11 DIAGNOSIS — K652 Spontaneous bacterial peritonitis: Secondary | ICD-10-CM | POA: Diagnosis present

## 2022-08-11 DIAGNOSIS — G8929 Other chronic pain: Secondary | ICD-10-CM | POA: Diagnosis present

## 2022-08-11 DIAGNOSIS — M5430 Sciatica, unspecified side: Secondary | ICD-10-CM | POA: Diagnosis present

## 2022-08-11 DIAGNOSIS — F419 Anxiety disorder, unspecified: Secondary | ICD-10-CM | POA: Diagnosis present

## 2022-08-11 DIAGNOSIS — F1190 Opioid use, unspecified, uncomplicated: Secondary | ICD-10-CM | POA: Diagnosis present

## 2022-08-11 DIAGNOSIS — Z79899 Other long term (current) drug therapy: Secondary | ICD-10-CM

## 2022-08-11 DIAGNOSIS — Z811 Family history of alcohol abuse and dependence: Secondary | ICD-10-CM

## 2022-08-11 DIAGNOSIS — A419 Sepsis, unspecified organism: Principal | ICD-10-CM | POA: Diagnosis present

## 2022-08-11 DIAGNOSIS — R0609 Other forms of dyspnea: Secondary | ICD-10-CM | POA: Diagnosis not present

## 2022-08-11 DIAGNOSIS — E669 Obesity, unspecified: Secondary | ICD-10-CM | POA: Diagnosis present

## 2022-08-11 DIAGNOSIS — Z86010 Personal history of colonic polyps: Secondary | ICD-10-CM

## 2022-08-11 DIAGNOSIS — G4733 Obstructive sleep apnea (adult) (pediatric): Secondary | ICD-10-CM | POA: Diagnosis present

## 2022-08-11 DIAGNOSIS — I2510 Atherosclerotic heart disease of native coronary artery without angina pectoris: Secondary | ICD-10-CM | POA: Diagnosis present

## 2022-08-11 DIAGNOSIS — J189 Pneumonia, unspecified organism: Secondary | ICD-10-CM | POA: Diagnosis present

## 2022-08-11 DIAGNOSIS — Z803 Family history of malignant neoplasm of breast: Secondary | ICD-10-CM

## 2022-08-11 DIAGNOSIS — Z1152 Encounter for screening for COVID-19: Secondary | ICD-10-CM

## 2022-08-11 DIAGNOSIS — E7800 Pure hypercholesterolemia, unspecified: Secondary | ICD-10-CM | POA: Diagnosis present

## 2022-08-11 DIAGNOSIS — F418 Other specified anxiety disorders: Secondary | ICD-10-CM | POA: Diagnosis present

## 2022-08-11 DIAGNOSIS — K7469 Other cirrhosis of liver: Secondary | ICD-10-CM | POA: Diagnosis not present

## 2022-08-11 DIAGNOSIS — G9341 Metabolic encephalopathy: Secondary | ICD-10-CM | POA: Diagnosis present

## 2022-08-11 DIAGNOSIS — R109 Unspecified abdominal pain: Secondary | ICD-10-CM | POA: Diagnosis not present

## 2022-08-11 DIAGNOSIS — Z90710 Acquired absence of both cervix and uterus: Secondary | ICD-10-CM

## 2022-08-11 DIAGNOSIS — Z888 Allergy status to other drugs, medicaments and biological substances status: Secondary | ICD-10-CM

## 2022-08-11 DIAGNOSIS — J449 Chronic obstructive pulmonary disease, unspecified: Secondary | ICD-10-CM | POA: Diagnosis present

## 2022-08-11 DIAGNOSIS — R52 Pain, unspecified: Secondary | ICD-10-CM | POA: Diagnosis not present

## 2022-08-11 LAB — URINALYSIS, ROUTINE W REFLEX MICROSCOPIC
Bilirubin Urine: NEGATIVE
Glucose, UA: NEGATIVE mg/dL
Hgb urine dipstick: NEGATIVE
Ketones, ur: NEGATIVE mg/dL
Leukocytes,Ua: NEGATIVE
Nitrite: NEGATIVE
Specific Gravity, Urine: 1.015 (ref 1.005–1.030)
pH: 8.5 — ABNORMAL HIGH (ref 5.0–8.0)

## 2022-08-11 LAB — COMPREHENSIVE METABOLIC PANEL
ALT: 14 U/L (ref 0–44)
AST: 26 U/L (ref 15–41)
Albumin: 3.6 g/dL (ref 3.5–5.0)
Alkaline Phosphatase: 88 U/L (ref 38–126)
Anion gap: 9 (ref 5–15)
BUN: 8 mg/dL (ref 8–23)
CO2: 28 mmol/L (ref 22–32)
Calcium: 8.9 mg/dL (ref 8.9–10.3)
Chloride: 96 mmol/L — ABNORMAL LOW (ref 98–111)
Creatinine, Ser: 0.79 mg/dL (ref 0.44–1.00)
GFR, Estimated: >60 mL/min (ref >60)
Glucose, Bld: 161 mg/dL — ABNORMAL HIGH (ref 70–99)
Potassium: 4.6 mmol/L (ref 3.5–5.1)
Sodium: 133 mmol/L — ABNORMAL LOW (ref 135–145)
Total Bilirubin: 0.7 mg/dL (ref 0.3–1.2)
Total Protein: 8.3 g/dL — ABNORMAL HIGH (ref 6.5–8.1)

## 2022-08-11 LAB — RESP PANEL BY RT-PCR (FLU A&B, COVID) ARPGX2
Influenza A by PCR: NEGATIVE
Influenza B by PCR: NEGATIVE
SARS Coronavirus 2 by RT PCR: NEGATIVE

## 2022-08-11 LAB — CBC
HCT: 34.9 % — ABNORMAL LOW (ref 36.0–46.0)
Hemoglobin: 10.7 g/dL — ABNORMAL LOW (ref 12.0–15.0)
MCH: 24.4 pg — ABNORMAL LOW (ref 26.0–34.0)
MCHC: 30.7 g/dL (ref 30.0–36.0)
MCV: 79.7 fL — ABNORMAL LOW (ref 80.0–100.0)
Platelets: 188 10*3/uL (ref 150–400)
RBC: 4.38 MIL/uL (ref 3.87–5.11)
RDW: 15.3 % (ref 11.5–15.5)
WBC: 14.2 10*3/uL — ABNORMAL HIGH (ref 4.0–10.5)
nRBC: 0 % (ref 0.0–0.2)

## 2022-08-11 LAB — DIFFERENTIAL
Abs Immature Granulocytes: 0.05 10*3/uL (ref 0.00–0.07)
Basophils Absolute: 0 10*3/uL (ref 0.0–0.1)
Basophils Relative: 0 %
Eosinophils Absolute: 0.1 10*3/uL (ref 0.0–0.5)
Eosinophils Relative: 1 %
Immature Granulocytes: 0 %
Lymphocytes Relative: 8 %
Lymphs Abs: 1.1 10*3/uL (ref 0.7–4.0)
Monocytes Absolute: 0.8 10*3/uL (ref 0.1–1.0)
Monocytes Relative: 6 %
Neutro Abs: 12.2 10*3/uL — ABNORMAL HIGH (ref 1.7–7.7)
Neutrophils Relative %: 86 %

## 2022-08-11 LAB — LIPASE, BLOOD: Lipase: 26 U/L (ref 11–51)

## 2022-08-11 LAB — PROTIME-INR
INR: 1.2 (ref 0.8–1.2)
Prothrombin Time: 14.9 seconds (ref 11.4–15.2)

## 2022-08-11 LAB — APTT: aPTT: 27 seconds (ref 24–36)

## 2022-08-11 LAB — LACTIC ACID, PLASMA
Lactic Acid, Venous: 1.4 mmol/L (ref 0.5–1.9)
Lactic Acid, Venous: 2 mmol/L (ref 0.5–1.9)

## 2022-08-11 LAB — CBG MONITORING, ED: Glucose-Capillary: 141 mg/dL — ABNORMAL HIGH (ref 70–99)

## 2022-08-11 MED ORDER — HYDROMORPHONE HCL 1 MG/ML IJ SOLN
1.0000 mg | Freq: Once | INTRAMUSCULAR | Status: AC
  Administered 2022-08-11: 1 mg via INTRAVENOUS
  Filled 2022-08-11: qty 1

## 2022-08-11 MED ORDER — LACTATED RINGERS IV BOLUS (SEPSIS)
600.0000 mL | Freq: Once | INTRAVENOUS | Status: AC
  Administered 2022-08-11: 600 mL via INTRAVENOUS

## 2022-08-11 MED ORDER — ACETAMINOPHEN 325 MG PO TABS
650.0000 mg | ORAL_TABLET | Freq: Once | ORAL | Status: AC
  Administered 2022-08-11: 650 mg via ORAL
  Filled 2022-08-11: qty 2

## 2022-08-11 MED ORDER — VANCOMYCIN HCL IN DEXTROSE 1-5 GM/200ML-% IV SOLN
1000.0000 mg | Freq: Once | INTRAVENOUS | Status: AC
  Administered 2022-08-11: 1000 mg via INTRAVENOUS
  Filled 2022-08-11: qty 200

## 2022-08-11 MED ORDER — LACTATED RINGERS IV SOLN: INTRAVENOUS | Status: DC

## 2022-08-11 MED ORDER — ONDANSETRON HCL 4 MG/2ML IJ SOLN
4.0000 mg | Freq: Once | INTRAMUSCULAR | Status: AC
  Administered 2022-08-11: 4 mg via INTRAVENOUS
  Filled 2022-08-11: qty 2

## 2022-08-11 MED ORDER — HYDROMORPHONE HCL 1 MG/ML IJ SOLN
1.0000 mg | INTRAMUSCULAR | Status: DC | PRN
  Administered 2022-08-11 – 2022-08-13 (×8): 1 mg via INTRAVENOUS
  Filled 2022-08-11 (×8): qty 1

## 2022-08-11 MED ORDER — IOHEXOL 300 MG/ML  SOLN
100.0000 mL | Freq: Once | INTRAMUSCULAR | Status: AC | PRN
  Administered 2022-08-11: 44 mL via INTRAVENOUS

## 2022-08-11 MED ORDER — OXYCODONE HCL 5 MG PO TABS
5.0000 mg | ORAL_TABLET | ORAL | Status: DC | PRN
  Administered 2022-08-11 (×2): 5 mg via ORAL
  Filled 2022-08-11 (×2): qty 1

## 2022-08-11 MED ORDER — SODIUM CHLORIDE 0.9 % IV SOLN
2.0000 g | Freq: Once | INTRAVENOUS | Status: AC
  Administered 2022-08-11: 2 g via INTRAVENOUS
  Filled 2022-08-11: qty 12.5

## 2022-08-11 MED ORDER — SODIUM CHLORIDE 0.9 % IV SOLN
2.0000 g | Freq: Three times a day (TID) | INTRAVENOUS | Status: AC
  Administered 2022-08-11 – 2022-08-15 (×13): 2 g via INTRAVENOUS
  Filled 2022-08-11 (×13): qty 12.5

## 2022-08-11 MED ORDER — LACTATED RINGERS IV BOLUS (SEPSIS)
1000.0000 mL | Freq: Once | INTRAVENOUS | Status: AC
  Administered 2022-08-11: 1000 mL via INTRAVENOUS

## 2022-08-11 MED ORDER — VANCOMYCIN HCL 1500 MG/300ML IV SOLN
1500.0000 mg | INTRAVENOUS | Status: DC
  Filled 2022-08-11: qty 300

## 2022-08-11 MED ORDER — METRONIDAZOLE 500 MG/100ML IV SOLN
500.0000 mg | Freq: Once | INTRAVENOUS | Status: AC
  Administered 2022-08-11: 500 mg via INTRAVENOUS
  Filled 2022-08-11: qty 100

## 2022-08-11 NOTE — ED Notes (Signed)
Patient placed on 2LNC at this time d/t desaturations to 86-87%.

## 2022-08-11 NOTE — ED Triage Notes (Signed)
Pt arrives to ED with c/o abdominal pain x5 days. Hx NASH.

## 2022-08-11 NOTE — ED Provider Notes (Signed)
[facility] [facility] EMERGENCY DEPT Provider Note   CSN: [id number] Arrival date & time: 08/11/22  4259     History  Chief Complaint  Patient presents with   Abdominal Pain    [name] is a 61 y.o. female.  Patient arrived by POV.  With a complaining of abdominal pain for about 5 days.  Patient has a history of nonalcoholic cirrhosis.  Vital signs significant on arrival temp 101.2 heart rate 106 respirations 26 blood pressure 152/85 oxygen sats on room air 96%.  Patient with recent admission September 15 for about 2 days for acute respiratory failure with hypoxia and hypercapnia that was at [facility].  Past medical history significant for obesity history of ascites cirrhosis of liver not due to alcohol hyperlipidemia hypertension sort of chronic pain COPD morbid obesity generalized abdominal pain diabetes.  Patient vital signs are concerning for sepsis.       Home Medications Prior to Admission medications   Medication Sig Start Date End Date Taking? Authorizing Provider  acetaminophen (TYLENOL) 500 MG tablet Take 1,000 mg by mouth every 6 (six) hours as needed for moderate pain.    [provider]  albuterol (PROVENTIL HFA;VENTOLIN HFA) 108 (90 BASE) MCG/ACT inhaler Inhale 2 puffs into the lungs every 4 (four) hours as needed for wheezing or shortness of breath.     [provider]  albuterol (PROVENTIL) (2.5 MG/3ML) 0.083% nebulizer solution Take 2.5 mg by nebulization every 6 (six) hours as needed for wheezing or shortness of breath.    [provider]  ALPRAZolam Duanne Moron) 1 MG tablet Take 2 mg by mouth at bedtime.    [provider]  amLODipine (NORVASC) 5 MG tablet TAKE 1 TABLET BY MOUTH IN THE MORNING AND AT BEDTIME Patient taking differently: Take 5 mg by mouth daily. 04/16/21   Bhagat, [name], PA  benazepril (LOTENSIN) 40 MG tablet Take 40 mg by mouth daily.     [provider]  Benzocaine-Resorcinol (VAGISIL EX)  Apply 1 application topically daily.    [provider]  cloNIDine (CATAPRES) 0.1 MG tablet Take 0.2 mg by mouth 2 (two) times daily as needed (blood pressure). 02/05/21   [provider]  docusate sodium (COLACE) 250 MG capsule Take 250 mg by mouth daily as needed for constipation.    [provider]  escitalopram (LEXAPRO) 20 MG tablet Take 20 mg by mouth every evening.  04/06/17   [provider]  estradiol (ESTRACE) 2 MG tablet Take 2 mg by mouth daily.     [provider]  fluticasone (FLONASE) 50 MCG/ACT nasal spray Place 1 spray into both nostrils daily as needed for allergies.    [provider]  Glycerin-Polysorbate 80 (REFRESH DRY EYE THERAPY OP) Place 1 drop into both eyes daily as needed (dry eyes).    [provider]  hydrOXYzine (ATARAX/VISTARIL) 25 MG tablet Take 25 mg by mouth every 8 (eight) hours as needed for anxiety.    [provider]  levocetirizine (XYZAL) 5 MG tablet Take 5 mg by mouth every evening.  09/16/19   [provider]  lidocaine (XYLOCAINE) 5 % ointment Apply 1 application topically 2 (two) times daily as needed (hemorrhoids).    [provider]  meloxicam (MOBIC) 15 MG tablet Take 15 mg by mouth daily as needed for pain.  09/16/19   [provider]  metFORMIN (GLUCOPHAGE) 500 MG tablet Take 2 tablets (1,000 mg total) by mouth 2 (two) times daily with a  meal. Patient taking differently: Take 500 mg by mouth daily as needed (for sugar). 12/13/14   Thurnell Lose, MD  naloxone [facility]) 4 MG/0.1ML LIQD nasal spray kit Place 1 spray into the nose as needed (for overdose).     [provider]  nystatin (MYCOSTATIN/NYSTOP) powder Apply 1 g topically daily as needed (irritation. (Typically during Summer months)).     [provider]  oxyCODONE (ROXICODONE) 15 MG immediate release tablet Take 1 tablet (15 mg total) by mouth every 8 (eight) hours as needed for  pain. Patient taking differently: Take 15 mg by mouth every 4 (four) hours. 03/29/21   [name], MD  pantoprazole (PROTONIX) 40 MG tablet Take 40 mg by mouth 2 (two) times daily.     [provider]  PHAZYME MAXIMUM STRENGTH 250 MG CAPS Take 250 mg by mouth 2 (two) times daily as needed (gas relief).    [provider]  polyethylene glycol (MIRALAX / GLYCOLAX) 17 g packet Take 17 g by mouth daily as needed for moderate constipation. 03/28/21   [name], MD  psyllium (METAMUCIL) 58.6 % packet Take 1 packet by mouth daily as needed (regularity).     [provider]  simvastatin (ZOCOR) 20 MG tablet Take 20 mg by mouth at bedtime.    [provider]  SYMBICORT 160-4.5 MCG/ACT inhaler Inhale 2 puffs into the lungs in the morning and at bedtime.  02/09/20   [provider]  torsemide (DEMADEX) 20 MG tablet Take 40 mg by mouth daily as needed (fluid).  12/30/18   [provider]  traZODone (DESYREL) 50 MG tablet Take 50 mg by mouth at bedtime. 01/15/21   [provider]  triamcinolone cream (KENALOG) 0.1 % Apply 1 application topically 2 (two) times daily as needed (rash).    [provider]      Allergies    Spironolactone, Gabapentin, Sulfamethoxazole-trimethoprim, Amoxicillin, Clindamycin/lincomycin, Doxycycline, Treximet [sumatriptan-naproxen sodium], and Bactrim    Review of Systems   Review of Systems  Constitutional:  Negative for chills and fever.  HENT:  Negative for ear pain, rhinorrhea and sore throat.   Eyes:  Negative for pain and visual disturbance.  Respiratory:  Positive for shortness of breath. Negative for cough.   Cardiovascular:  Negative for chest pain, palpitations and leg swelling.  Gastrointestinal:  Positive for abdominal pain. Negative for diarrhea, nausea and vomiting.  Genitourinary:  Negative for dysuria and hematuria.  Musculoskeletal:  Negative for arthralgias, back pain and neck  pain.  Skin:  Negative for color change and rash.  Neurological:  Negative for dizziness, seizures, syncope, light-headedness and headaches.  Hematological:  Does not bruise/bleed easily.  Psychiatric/Behavioral:  Negative for confusion.   All other systems reviewed and are negative.   Physical Exam Updated Vital Signs BP (!) 127/109   Pulse (!) 101   Temp 99 F (37.2 C)   Resp (!) 21   Ht 1.575 m (_0 )   Wt 108 kg   SpO2 93%   BMI 43.53 kg/m  Physical Exam Vitals and nursing note reviewed.  Constitutional:      General: She is in acute distress.     Appearance: She is well-developed.  HENT:     Head: Normocephalic and atraumatic.     Mouth/Throat:     Pharynx: No pharyngeal swelling or oropharyngeal exudate.     Comments: Dry Eyes:     Conjunctiva/sclera: Conjunctivae normal.  Cardiovascular:     Rate  and Rhythm: Regular rhythm. Tachycardia present.     Heart sounds: No murmur heard. Pulmonary:     Effort: Respiratory distress present.     Breath sounds: Normal breath sounds. No wheezing, rhonchi or rales.  Chest:     Chest wall: No tenderness.  Abdominal:     General: There is distension.     Palpations: Abdomen is soft.     Tenderness: There is no abdominal tenderness.     Hernia: No hernia is present.  Musculoskeletal:        General: No swelling.     Cervical back: Neck supple.  Skin:    General: Skin is warm and dry.     Capillary Refill: Capillary refill takes less than 2 seconds.  Neurological:     Mental Status: She is alert.  Psychiatric:        Mood and Affect: Mood normal.     ED Results / Procedures / Treatments   Labs (all labs ordered are listed, but only abnormal results are displayed) Labs Reviewed  COMPREHENSIVE METABOLIC PANEL - Abnormal; Notable for the following components:      Result Value   Sodium 133 (*)    Chloride 96 (*)    Glucose, Bld 161 (*)    Total Protein 8.3 (*)    All other components within normal limits  CBC -  Abnormal; Notable for the following components:   WBC 14.2 (*)    Hemoglobin 10.7 (*)    HCT 34.9 (*)    MCV 79.7 (*)    MCH 24.4 (*)    All other components within normal limits  URINALYSIS, ROUTINE W REFLEX MICROSCOPIC - Abnormal; Notable for the following components:   pH 8.5 (*)    Protein, ur TRACE (*)    Bacteria, UA RARE (*)    All other components within normal limits  LACTIC ACID, PLASMA - Abnormal; Notable for the following components:   Lactic Acid, Venous 2.0 (*)    All other components within normal limits  DIFFERENTIAL - Abnormal; Notable for the following components:   Neutro Abs 12.2 (*)    All other components within normal limits  RESP PANEL BY RT-PCR (FLU A&B, COVID) ARPGX2  CULTURE, BLOOD (ROUTINE X 2)  CULTURE, BLOOD (ROUTINE X 2)  URINE CULTURE  LIPASE, BLOOD  LACTIC ACID, PLASMA  PROTIME-INR  APTT  LACTIC ACID, PLASMA    EKG EKG Interpretation  Date/Time:  Monday August 11 2022 08:15:13 EST Ventricular Rate:  96 PR Interval:  109 QRS Duration: 99 QT Interval:  364 QTC Calculation: 460 R Axis:   93 Text Interpretation: Sinus rhythm Short PR interval Right axis deviation Confirmed by Fredia Sorrow [phone]) on 08/11/2022 8:29:28 AM  Radiology CT CHEST ABDOMEN PELVIS WO CONTRAST  Result Date: 08/11/2022 CLINICAL DATA:  Acute abdominal pain. Pain for 5 days. History of NASH cirrhosis EXAM: CT CHEST, ABDOMEN AND PELVIS WITHOUT   CONTRAST TECHNIQUE: Multidetector CT imaging of the chest, abdomen and pelvis was performed following the standard protocol with IV contrast. IV contrast within the ureters and bladder related to infiltration of IV contrast. Subsequent IV cannot be started in noncontrast imaging was performed. RADIATION DOSE REDUCTION: This exam was performed according to the departmental dose-optimization program which includes automated exposure control, adjustment of the mA and/or kV according to patient size and/or use of iterative  reconstruction technique. CONTRAST:  Infiltration of IV contrast. COMPARISON:  None Available. FINDINGS: CT CHEST FINDINGS Cardiovascular: Coronary artery calcification and  aortic atherosclerotic calcification. Mediastinum/Nodes: No axillary or supraclavicular adenopathy. No mediastinal or hilar adenopathy. No pericardial fluid. Esophagus normal. Lungs/Pleura: There is a cluster of nodules in the superior segment of the LEFT lower lobe in a tree-in-bud pattern (image 51 through 58 of series 6). No pleural fluid. Musculoskeletal: No aggressive osseous lesion. CT ABDOMEN AND PELVIS FINDINGS Hepatobiliary: Liver has a nodular contour. No enhancing lesion on delayed contrast imaging. Moderate volume of free fluid surrounds the anterior margin of the liver. Pancreas: Pancreas is normal. No ductal dilatation. No pancreatic inflammation. Spleen: Normal spleen Adrenals/urinary tract: Adrenal glands, kidneys, ureters and bladder normal. Stomach/Bowel: The stomach, duodenum, and small bowel normal. The colon and rectosigmoid colon are normal. Moderate volume stool throughout the colon. Vascular/Lymphatic: Abdominal aorta is normal caliber with atherosclerotic calcification. There is no retroperitoneal or periportal lymphadenopathy. No pelvic lymphadenopathy. Reproductive: Post hysterectomy.  Adnexa unremarkable Other: No free fluid. Musculoskeletal: No aggressive osseous lesion. IMPRESSION: Chest Impression: 1. Tree-in-bud pattern in the superior segment of the LEFT upper lobe suggest mild pulmonary infection. Recommend follow-up CT in 6 to 8 weeks to demonstrate resolution. 2. No mediastinal adenopathy. 3. Coronary artery calcification and Aortic Atherosclerosis (ICD10-I70.0). Abdomen / Pelvis Impression: 1. Moderate volume free fluid in the abdomen pelvis similar to comparison CT. Favor ascites related to cirrhosis. 2. Moderate volume stool suggest mild constipation. 3. Nodule liver consistent with cirrhosis. Electronically  Signed   By: Suzy Bouchard M.D.   On: 08/11/2022 14:10   DG Chest Port 1 View  Result Date: 08/11/2022 CLINICAL DATA:  Abdominal pain.  Question sepsis. EXAM: PORTABLE CHEST 1 VIEW COMPARISON:  05/30/2022 FINDINGS: The heart size and mediastinal contours are within normal limits. Both lungs are clear. The visualized skeletal structures are unremarkable. IMPRESSION: No active disease. Electronically Signed   By: Nelson Chimes M.D.   On: 08/11/2022 09:01    Procedures Procedures    Medications Ordered in ED Medications  lactated ringers infusion ( Intravenous New Bag/Given 08/11/22 1439)  vancomycin (VANCOREADY) IVPB 1500 mg/300 mL (has no administration in time range)  ceFEPIme (MAXIPIME) 2 g in sodium chloride 0.9 % 100 mL IVPB (has no administration in time range)  lactated ringers bolus 1,000 mL (0 mLs Intravenous Stopped 08/11/22 1045)    And  lactated ringers bolus 600 mL (600 mLs Intravenous New Bag/Given 08/11/22 1045)  ceFEPIme (MAXIPIME) 2 g in sodium chloride 0.9 % 100 mL IVPB (0 g Intravenous Stopped 08/11/22 0957)  metroNIDAZOLE (FLAGYL) IVPB 500 mg (500 mg Intravenous New Bag/Given 08/11/22 1047)  vancomycin (VANCOCIN) IVPB 1000 mg/200 mL premix (1,000 mg Intravenous New Bag/Given 08/11/22 1207)  vancomycin (VANCOCIN) IVPB 1000 mg/200 mL premix (1,000 mg Intravenous New Bag/Given 08/11/22 1439)  ondansetron (ZOFRAN) injection 4 mg (4 mg Intravenous Given 08/11/22 1008)  HYDROmorphone (DILAUDID) injection 1 mg (1 mg Intravenous Given 08/11/22 1010)  iohexol (OMNIPAQUE) 300 MG/ML solution 100 mL (44 mLs Intravenous Contrast Given 08/11/22 1017)  HYDROmorphone (DILAUDID) injection 1 mg (1 mg Intravenous Given 08/11/22 1305)  acetaminophen (TYLENOL) tablet 650 mg (650 mg Oral Given 08/11/22 1449)    ED Course/ Medical Decision Making/ A&P                           Medical Decision Making Amount and/or Complexity of Data Reviewed Labs: ordered. Radiology:  ordered. ECG/medicine tests: ordered.  Risk OTC drugs. Prescription drug management. Decision regarding hospitalization.   CRITICAL CARE Performed by: Fredia Sorrow Total critical  care time: 60 minutes Critical care time was exclusive of separately billable procedures and treating other patients. Critical care was necessary to treat or prevent imminent or life-threatening deterioration. Critical care was time spent personally by me on the following activities: development of treatment plan with patient and/or surrogate as well as nursing, discussions with consultants, evaluation of patient's response to treatment, examination of patient, obtaining history from patient or surrogate, ordering and performing treatments and interventions, ordering and review of laboratory studies, ordering and review of radiographic studies, pulse oximetry and re-evaluation of patient's condition.  Patient on arrival vital signs met concern for sepsis criteria.  Patient had a fever was tachypneic and tachycardic.  Blood pressure was always good.  Based on this and the abdominal pain known history of nonalcoholic cirrhosis known ascites sepsis protocol was initiated with broad-spectrum antibiotics.  Work-up initial lactic acid was less than 2 but repeat was 2 so it went up.  Urinalysis negative for urinary tract infection.  Chest x-ray negative.  Lipase 26 complete metabolic panel sodium 161 glucose 161 renal function very normal with GFR greater than 60 and the liver functions were normal to include total bili at 0.7.  Alk phos 88.  DBC White count was 14.2 hemoglobin 10.7.  Platelets good at 188.  COVID and influenza was negative.  Patient's INR was normal at 1.2.  Based on this went on with CT chest abdomen and pelvis.  CT chest raise concerns for left upper lobe pneumonia.  CT abdomen did show evidence of moderate ascites.  80s favored to be related to the cirrhosis.  No significant change from previous CT.   And moderate volume stool suggest mild constipation and certainly patient has chronic pain.  And is on chronic pain meds.  Patient's abdomen is distended but not particularly tender.  Not can turn her sick to concerned about peritonitis but possible.  But already treated with broad-spectrum antibiotics.  Although it says moderate ascites seems to be more.  Consideration for tap once admitted was expressed and discussed with the hospitalist.  Definitely seems to have concerns for left upper lobe pneumonia.  However no significant hypoxia on room air  Patient never got hypotensive.  Felt better with pain medicine and the fluids.  Discussed with hospitalist who will admit.    Final Clinical Impression(s) / ED Diagnoses Final diagnoses:  Sepsis, due to unspecified organism, unspecified whether acute organ dysfunction present [facility])  Cirrhosis of liver with ascites, unspecified hepatic cirrhosis type ([place])  Pneumonia of left upper lobe due to infectious organism    Rx / DC Orders ED Discharge Orders     None         Fredia Sorrow, MD 08/11/22 [phone]

## 2022-08-11 NOTE — Progress Notes (Signed)
Elink following code sepsis °

## 2022-08-11 NOTE — ED Notes (Incomplete)
Can we please get updated vitals & a CBG on this pt? Thanks!!

## 2022-08-11 NOTE — ED Notes (Signed)
Received patient in no acute distress breathing spontaneously to room air. Patient denies pain at this time . No IV complications noted . Continuing monitoring and care . Pending admission bed .

## 2022-08-11 NOTE — ED Notes (Signed)
Can we please get updated vitals & a CBG on this pt? Thanks!!

## 2022-08-11 NOTE — Progress Notes (Signed)
Pharmacy Antibiotic Note  [name] is a 61 y.o. female admitted on 08/11/2022 with sepsis. Pharmacy has been consulted for vancomycin and cefepime dosing. Pt is febrile with Tmax 101.2. WBC is elevated at 14.2. SCr and lactic acid are WNL.   Plan: Vancomycin 2gm IV x 1 then 1537m IV Q24H Cefepime 2gm IV Q8H F/u renal fxn, C&S, clinical status and peak/trough at SS  Height: 5' 2"  (157.5 cm) Weight: 108 kg (238 lb) IBW/kg (Calculated) : 50.1  Temp (24hrs), Avg:101.2 F (38.4 C), Min:101.2 F (38.4 C), Max:101.2 F (38.4 C)  Recent Labs  Lab 08/11/22 0803 08/11/22 0826  WBC 14.2*  --   CREATININE 0.79  --   LATICACIDVEN  --  1.4    Estimated Creatinine Clearance: 83.3 mL/min (by C-G formula based on SCr of 0.79 mg/dL).    Allergies  Allergen Reactions   Spironolactone Nausea Only    nausea   Gabapentin Other (See Comments)    sleepwalking   Sulfamethoxazole-Trimethoprim Hives   Amoxicillin Nausea And Vomiting and Other (See Comments)    tolerated augmentin in the past (03/2013) without issue. Did it involve swelling of the face/tongue/throat, SOB, or low BP? No Did it involve sudden or severe rash/hives, skin peeling, or any reaction on the inside of your mouth or nose? No Did you need to seek medical attention at a hospital or doctor's office? No When did it last happen? unk       If all above answers are "NO", may proceed with cephalosporin use.    Clindamycin/Lincomycin Nausea And Vomiting   Doxycycline Nausea And Vomiting   Treximet [Sumatriptan-Naproxen Sodium] Nausea And Vomiting   Bactrim Hives and Rash    Antimicrobials this admission: Vanc 11/27>> Cefepime 11/27> Flagyl x 1 11/27  Dose adjustments this admission: N/A  Microbiology results: Pending  Thank you for allowing pharmacy to be a part of this patient's care.  Davius Goudeau, RRande Lawman11/27/2023 8:46 AM

## 2022-08-12 ENCOUNTER — Inpatient Hospital Stay (HOSPITAL_COMMUNITY): Payer: Medicare Other

## 2022-08-12 DIAGNOSIS — A419 Sepsis, unspecified organism: Secondary | ICD-10-CM

## 2022-08-12 DIAGNOSIS — R188 Other ascites: Secondary | ICD-10-CM

## 2022-08-12 DIAGNOSIS — J9611 Chronic respiratory failure with hypoxia: Secondary | ICD-10-CM | POA: Diagnosis present

## 2022-08-12 DIAGNOSIS — J189 Pneumonia, unspecified organism: Secondary | ICD-10-CM

## 2022-08-12 HISTORY — PX: IR PARACENTESIS: IMG2679

## 2022-08-12 LAB — GLUCOSE, CAPILLARY
Glucose-Capillary: 127 mg/dL — ABNORMAL HIGH (ref 70–99)
Glucose-Capillary: 162 mg/dL — ABNORMAL HIGH (ref 70–99)
Glucose-Capillary: 135 mg/dL — ABNORMAL HIGH (ref 70–99)
Glucose-Capillary: 126 mg/dL — ABNORMAL HIGH (ref 70–99)

## 2022-08-12 LAB — COMPREHENSIVE METABOLIC PANEL
ALT: 15 U/L (ref 0–44)
AST: 24 U/L (ref 15–41)
Albumin: 2.6 g/dL — ABNORMAL LOW (ref 3.5–5.0)
Alkaline Phosphatase: 77 U/L (ref 38–126)
Anion gap: 10 (ref 5–15)
BUN: 7 mg/dL — ABNORMAL LOW (ref 8–23)
CO2: 28 mmol/L (ref 22–32)
Calcium: 8.1 mg/dL — ABNORMAL LOW (ref 8.9–10.3)
Chloride: 97 mmol/L — ABNORMAL LOW (ref 98–111)
Creatinine, Ser: 0.79 mg/dL (ref 0.44–1.00)
GFR, Estimated: >60 mL/min (ref >60)
Glucose, Bld: 125 mg/dL — ABNORMAL HIGH (ref 70–99)
Potassium: 3.9 mmol/L (ref 3.5–5.1)
Sodium: 135 mmol/L (ref 135–145)
Total Bilirubin: 1.3 mg/dL — ABNORMAL HIGH (ref 0.3–1.2)
Total Protein: 6.8 g/dL (ref 6.5–8.1)

## 2022-08-12 LAB — GRAM STAIN

## 2022-08-12 LAB — LACTATE DEHYDROGENASE, PLEURAL OR PERITONEAL FLUID: LD, Fluid: 106 U/L — ABNORMAL HIGH (ref 3–23)

## 2022-08-12 LAB — BODY FLUID CELL COUNT WITH DIFFERENTIAL
Eos, Fluid: 0 %
Lymphs, Fluid: 9 %
Monocyte-Macrophage-Serous Fluid: 14 % — ABNORMAL LOW (ref 50–90)
Neutrophil Count, Fluid: 77 % — ABNORMAL HIGH (ref 0–25)
Total Nucleated Cell Count, Fluid: 1710 cu mm — ABNORMAL HIGH (ref 0–1000)

## 2022-08-12 LAB — URINE CULTURE: Culture: NO GROWTH

## 2022-08-12 LAB — ALBUMIN, PLEURAL OR PERITONEAL FLUID: Albumin, Fluid: 1.7 g/dL

## 2022-08-12 LAB — PROTEIN, PLEURAL OR PERITONEAL FLUID: Total protein, fluid: 4.1 g/dL

## 2022-08-12 LAB — CBC
HCT: 31.3 % — ABNORMAL LOW (ref 36.0–46.0)
Hemoglobin: 9.6 g/dL — ABNORMAL LOW (ref 12.0–15.0)
MCH: 24.7 pg — ABNORMAL LOW (ref 26.0–34.0)
MCHC: 30.7 g/dL (ref 30.0–36.0)
MCV: 80.5 fL (ref 80.0–100.0)
Platelets: 168 10*3/uL (ref 150–400)
RBC: 3.89 MIL/uL (ref 3.87–5.11)
RDW: 15.6 % — ABNORMAL HIGH (ref 11.5–15.5)
WBC: 16.5 10*3/uL — ABNORMAL HIGH (ref 4.0–10.5)
nRBC: 0 % (ref 0.0–0.2)

## 2022-08-12 LAB — GLUCOSE, PLEURAL OR PERITONEAL FLUID: Glucose, Fluid: 149 mg/dL

## 2022-08-12 LAB — PROTIME-INR
INR: 1.4 — ABNORMAL HIGH (ref 0.8–1.2)
Prothrombin Time: 17.3 seconds — ABNORMAL HIGH (ref 11.4–15.2)

## 2022-08-12 LAB — PROCALCITONIN: Procalcitonin: 0.27 ng/mL

## 2022-08-12 MED ORDER — SIMVASTATIN 20 MG PO TABS
20.0000 mg | ORAL_TABLET | Freq: Every day | ORAL | Status: DC
  Administered 2022-08-12 – 2022-08-15 (×4): 20 mg via ORAL
  Filled 2022-08-12 (×4): qty 1

## 2022-08-12 MED ORDER — ACETAMINOPHEN 650 MG RE SUPP: 650.0000 mg | Freq: Four times a day (QID) | RECTAL | Status: DC | PRN

## 2022-08-12 MED ORDER — CYCLOBENZAPRINE HCL 10 MG PO TABS
10.0000 mg | ORAL_TABLET | Freq: Three times a day (TID) | ORAL | Status: DC | PRN
  Administered 2022-08-12 – 2022-08-16 (×6): 10 mg via ORAL
  Filled 2022-08-12 (×6): qty 1

## 2022-08-12 MED ORDER — ENOXAPARIN SODIUM 40 MG/0.4ML IJ SOSY
40.0000 mg | PREFILLED_SYRINGE | INTRAMUSCULAR | Status: DC
  Administered 2022-08-12 – 2022-08-15 (×4): 40 mg via SUBCUTANEOUS
  Filled 2022-08-12 (×4): qty 0.4

## 2022-08-12 MED ORDER — MOMETASONE FURO-FORMOTEROL FUM 200-5 MCG/ACT IN AERO
2.0000 | INHALATION_SPRAY | Freq: Two times a day (BID) | RESPIRATORY_TRACT | Status: DC
  Administered 2022-08-14 – 2022-08-15 (×3): 2 via RESPIRATORY_TRACT
  Filled 2022-08-12: qty 8.8

## 2022-08-12 MED ORDER — INSULIN ASPART 100 UNIT/ML IJ SOLN
0.0000 [IU] | Freq: Three times a day (TID) | INTRAMUSCULAR | Status: DC
  Administered 2022-08-12: 2 [IU] via SUBCUTANEOUS
  Administered 2022-08-12 – 2022-08-13 (×3): 1 [IU] via SUBCUTANEOUS
  Administered 2022-08-13: 2 [IU] via SUBCUTANEOUS
  Administered 2022-08-14: 3 [IU] via SUBCUTANEOUS
  Administered 2022-08-14: 2 [IU] via SUBCUTANEOUS
  Administered 2022-08-15: 1 [IU] via SUBCUTANEOUS
  Administered 2022-08-15: 3 [IU] via SUBCUTANEOUS
  Administered 2022-08-16: 5 [IU] via SUBCUTANEOUS
  Administered 2022-08-16: 2 [IU] via SUBCUTANEOUS

## 2022-08-12 MED ORDER — SENNOSIDES-DOCUSATE SODIUM 8.6-50 MG PO TABS
1.0000 | ORAL_TABLET | Freq: Two times a day (BID) | ORAL | Status: DC
  Administered 2022-08-12 – 2022-08-15 (×8): 1 via ORAL
  Filled 2022-08-12 (×8): qty 1

## 2022-08-12 MED ORDER — OXYCODONE HCL 5 MG PO TABS
15.0000 mg | ORAL_TABLET | ORAL | Status: DC
  Administered 2022-08-12 – 2022-08-14 (×14): 15 mg via ORAL
  Filled 2022-08-12 (×15): qty 3

## 2022-08-12 MED ORDER — ALPRAZOLAM 0.5 MG PO TABS
2.0000 mg | ORAL_TABLET | Freq: Every day | ORAL | Status: DC
  Administered 2022-08-12 – 2022-08-15 (×5): 2 mg via ORAL
  Filled 2022-08-12 (×5): qty 4

## 2022-08-12 MED ORDER — PANTOPRAZOLE SODIUM 40 MG PO TBEC
40.0000 mg | DELAYED_RELEASE_TABLET | Freq: Two times a day (BID) | ORAL | Status: DC
  Administered 2022-08-12 – 2022-08-16 (×9): 40 mg via ORAL
  Filled 2022-08-12 (×9): qty 1

## 2022-08-12 MED ORDER — SODIUM CHLORIDE 0.9 % IV SOLN
500.0000 mg | INTRAVENOUS | Status: DC
  Administered 2022-08-12 – 2022-08-15 (×4): 500 mg via INTRAVENOUS
  Filled 2022-08-12 (×4): qty 5

## 2022-08-12 MED ORDER — HYDROXYZINE HCL 25 MG PO TABS
25.0000 mg | ORAL_TABLET | Freq: Three times a day (TID) | ORAL | Status: DC | PRN
  Administered 2022-08-15 (×2): 25 mg via ORAL
  Filled 2022-08-12 (×2): qty 1

## 2022-08-12 MED ORDER — SIMETHICONE 80 MG PO CHEW: 80.0000 mg | CHEWABLE_TABLET | Freq: Four times a day (QID) | ORAL | Status: DC | PRN

## 2022-08-12 MED ORDER — ACETAMINOPHEN 325 MG PO TABS
650.0000 mg | ORAL_TABLET | Freq: Four times a day (QID) | ORAL | Status: DC | PRN
  Administered 2022-08-12 – 2022-08-14 (×5): 650 mg via ORAL
  Filled 2022-08-12 (×6): qty 2

## 2022-08-12 MED ORDER — ALBUTEROL SULFATE (2.5 MG/3ML) 0.083% IN NEBU: 2.5000 mg | INHALATION_SOLUTION | Freq: Four times a day (QID) | RESPIRATORY_TRACT | Status: DC | PRN

## 2022-08-12 MED ORDER — SENNOSIDES-DOCUSATE SODIUM 8.6-50 MG PO TABS: 1.0000 | ORAL_TABLET | Freq: Every evening | ORAL | Status: DC | PRN

## 2022-08-12 MED ORDER — SODIUM CHLORIDE 0.9% FLUSH
3.0000 mL | Freq: Two times a day (BID) | INTRAVENOUS | Status: DC
  Administered 2022-08-12 – 2022-08-16 (×7): 3 mL via INTRAVENOUS

## 2022-08-12 MED ORDER — ESCITALOPRAM OXALATE 10 MG PO TABS
20.0000 mg | ORAL_TABLET | Freq: Every evening | ORAL | Status: DC
  Administered 2022-08-12 – 2022-08-15 (×4): 20 mg via ORAL
  Filled 2022-08-12 (×4): qty 2

## 2022-08-12 MED ORDER — ONDANSETRON HCL 4 MG/2ML IJ SOLN: 4.0000 mg | Freq: Four times a day (QID) | INTRAMUSCULAR | Status: DC | PRN

## 2022-08-12 MED ORDER — LIDOCAINE HCL 1 % IJ SOLN
INTRAMUSCULAR | Status: AC
  Administered 2022-08-12: 10 mL
  Filled 2022-08-12: qty 20

## 2022-08-12 MED ORDER — SIMETHICONE 80 MG PO CHEW
160.0000 mg | CHEWABLE_TABLET | Freq: Four times a day (QID) | ORAL | Status: DC | PRN
  Administered 2022-08-12: 160 mg via ORAL
  Filled 2022-08-12: qty 2

## 2022-08-12 MED ORDER — ONDANSETRON HCL 4 MG PO TABS: 4.0000 mg | ORAL_TABLET | Freq: Four times a day (QID) | ORAL | Status: DC | PRN

## 2022-08-12 MED ORDER — TRAZODONE HCL 50 MG PO TABS
50.0000 mg | ORAL_TABLET | Freq: Every day | ORAL | Status: DC
  Administered 2022-08-12 – 2022-08-15 (×5): 50 mg via ORAL
  Filled 2022-08-12 (×5): qty 1

## 2022-08-12 NOTE — Progress Notes (Signed)
Mobility Specialist - Progress Note   08/12/22 1500  Mobility  Activity Transferred to/from [facility]  Level of Assistance Standby assist, set-up cues, supervision of patient - no hands on  Assistive Device None  Activity Response Tolerated well  Mobility Referral Yes  $Mobility charge 1 Mobility   Pt received in bed requesting assistance to [facility]. Pt declined further ambulation d/t pain and waiting for pain medicine. Pt was returned to bed with all needs met.   Franki Monte  Mobility Specialist Please contact via Solicitor or Rehab office at [phone]
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
## 2022-08-12 NOTE — Assessment & Plan Note (Signed)
History of [name] cirrhosis presenting with abdominal pain, distention, ascites.  Concern for infection/SBP as above.  Mentating well on admission. -US paracentesis -Consider adjustment in home meds to add spironolactone/Lasix

## 2022-08-12 NOTE — Assessment & Plan Note (Signed)
Chronic respiratory failure with hypoxia Stable, no wheezing on admission.  Previously using home O2 but states that she cannot afford it any longer. -Continue Dulera, albuterol as needed -Supplemental O2 as needed

## 2022-08-12 NOTE — Assessment & Plan Note (Signed)
Has not been using CPAP since her home equipment broke several years ago.  Has not had repeat sleep study.

## 2022-08-12 NOTE — Hospital Course (Signed)
[name] is a 61 y.o. female with medical history significant for CAD, COPD, cirrhosis due to NASH, T2DM, HTN, HLD, depression/anxiety, anemia, depression/anxiety, chronic back and abdominal pain on opioids, chronic benzodiazepine use, OSA not using CPAP who is admitted with sepsis.

## 2022-08-12 NOTE — Assessment & Plan Note (Signed)
Continue Lexapro and home Xanax 2 mg qhs.

## 2022-08-12 NOTE — Assessment & Plan Note (Signed)
BP stable.  Hold home amlodipine and benazepril.  Consider change to spironolactone/Lasix regimen given history of cirrhosis with ascites.

## 2022-08-12 NOTE — Assessment & Plan Note (Signed)
Continue home oxycodone 15 mg every 4 hours with hold parameters.

## 2022-08-12 NOTE — Procedures (Signed)
PROCEDURE SUMMARY:  Successful ultrasound guided paracentesis from the left lower quadrant.  Yielded 1.9 L of clear yellow fluid.  No immediate complications.  The patient tolerated the procedure well.   Specimen was sent for labs.  EBL < 67m  If the patient eventually requires >/=2 paracenteses in a 30 day period, [facility] will be assessed.  SCandiss Norse PA-C

## 2022-08-12 NOTE — Assessment & Plan Note (Signed)
Hold home meds and placed on SSI.

## 2022-08-12 NOTE — Progress Notes (Signed)
Pt states she does not take any inhalers at home other than her PRN inhaler and does not want to take [facility] while she is here. Medication documented under refused.

## 2022-08-12 NOTE — Assessment & Plan Note (Signed)
Presenting with leukocytosis, fever, tachycardia, tachypnea.  CT shows possible early LUL PNA.  She is not having any respiratory symptoms.  She is ascites on imaging with primary complaint of abdominal pain concerning for SBP.  COVID and flu negative.  UA negative for UTI. -Continue IV cefepime -Add azithromycin -Follow blood cultures -US paracentesis with labs ordered for a.m. -S/p IV fluid resuscitation in the ED, hold further maintenance fluids

## 2022-08-12 NOTE — Assessment & Plan Note (Signed)
Reports vaping.  Declines nicotine patch.

## 2022-08-12 NOTE — Care Management (Signed)
  Transition of Care [facility]) Screening Note   Patient Details  Name: [name] Date of Birth: [birth year]   Transition of Care [facility]) CM/SW Contact:    Bethena Roys, RN Phone Number: 08/12/2022, 12:27 PM    Transition of Care Department [facility]) has reviewed the patient and no TOC needs have been identified at this time. Case Manager will continue to monitor patient advancement through interdisciplinary progression rounds. If new patient transition needs arise, please place a TOC consult.

## 2022-08-12 NOTE — H&P (Signed)
History and Physical    [name] ERX:[id number] DOB: 21-Feb-1961 DOA: 08/11/2022  PCP: Shirline Frees, MD  Patient coming from: Home  I have personally briefly reviewed patient's old medical records in [place]  Chief Complaint: Abdominal pain  HPI: [name] is a 61 y.o. female with medical history significant for CAD, COPD, cirrhosis due to NASH, T2DM, HTN, HLD, depression/anxiety, anemia, depression/anxiety, chronic back and abdominal pain on opioids, chronic benzodiazepine use, OSA not using CPAP who presented to the ED for evaluation of abdominal pain.  Patient reports developing acute on chronic abdominal pain 5 days ago, more severe over the last 2 days.  She is not sure if her abdomen is larger than baseline.  Pain has been uncontrollable despite home oxycodone pain regimen.  She denies any dyspnea, cough, chest pain, nausea, vomiting, dysuria, diarrhea.  She has been febrile in the ED.  [facility] ED Course  Labs/Imaging on admission: I have personally reviewed following labs and imaging studies.  Initial vitals showed BP 152/85, pulse 106, RR 26, temp 101.2 F, SpO2 96% on room air.  Tmax 102.2 F.  Labs show WBC 14.2, hemoglobin 10.7, platelets 188,000, sodium 133, potassium 4.6, bicarb 28, BUN 8, creatinine 0.79, serum glucose 161, LFTs within normal limits, lactic acid 1.4 > 2.0.  Blood cultures in process.  Urinalysis negative for UTI.  Urine culture in process.  SARS-CoV-2 and influenza PCR negative.  CT chest/abdomen/pelvis without contrast showed tree-in-bud pattern in superior segment of the left upper lobe suggestive of mild pulmonary infection.  Moderate volume free fluid in the abdomen pelvis similar comparison CT, favor ascites related to cirrhosis.  Moderate volume stool noted.  Patient was given 1.6 L LR, IV vancomycin, cefepime, Flagyl, IV Dilaudid 1 mg x 4, Oxy IR 5 mg x 2.  The hospitalist service was consulted to admit for further  evaluation and management.  Review of Systems: All systems reviewed and are negative except as documented in history of present illness above.   Past Medical History:  Diagnosis Date   Abdominal pain 01/12/2019   Abdominal pain, generalized 07/10/2014   Acute on chronic respiratory failure with hypoxia ([name]) 10/18/2018   Anxiety    Arthritis    Ascites 05/01/2019   NALD   Asthma    CAD (coronary artery disease)    cath 2010 with 60-70% left Cx and normal LVF   Chronic low back pain with left-sided sciatica 10/15/2015   Chronic pain    Chronic prescription benzodiazepine use 05/01/2019   Chronic, continuous use of opioids 05/01/2019   Cirrhosis of liver not due to alcohol ([place]) 05/01/2019   Cirrhosis of liver: per CT 07/11/2014   Constipation by delayed colonic transit 12/09/2014   COPD (chronic obstructive pulmonary disease) ([place])    COPD exacerbation ([place]) 10/12/2016   Dehiscence of surgical wound left knee 03/16/2013   Depression    Depression with anxiety 01/11/2019   Deviated septum    Diabetes mellitus without complication ([place])    type 2   Generalized abdominal pain 12/09/2014   GERD (gastroesophageal reflux disease)    Headache(784.0)    migraines   Hepatic cirrhosis ([place]) 06/2014   HLD (hyperlipidemia) 01/11/2019   Hypercholesterolemia    Hypertension    Hypertension associated with diabetes ([place])    Ileus ([place]) 07/11/2014   Influenza A 10/2018   Lobar pneumonia ([place])    Microcytic anemia 01/11/2019   Migraine headache 12/09/2014   Morbid obesity ([place]) 03/09/2013  Obesity    OSA (obstructive sleep apnea) 01/11/2019   Pars defect with spondylolisthesis 05/01/2019   Perforated bowel ([place])    PONV (postoperative nausea and vomiting)    Respiratory failure, acute-on-chronic ([place]) 12/11/2010   S/P left PF UKR 03/07/2013   Seizures ([place])    as a little girl 61 years old   Shortness of breath    Sleep apnea    dont wear bipap . lost weight   Spondylolisthesis, grade 2     Tobacco abuse    Type 2 diabetes mellitus with obesity ([place]) 01/11/2019    Past Surgical History:  Procedure Laterality Date   ABDOMINAL EXPLORATION SURGERY     primary repair of colon perforation from [facility]   ABDOMINAL HYSTERECTOMY     total   BACK SURGERY     lumbar   COLONOSCOPY WITH PROPOFOL N/A 10/04/2019   Procedure: COLONOSCOPY WITH PROPOFOL;  Surgeon: Wilford Corner, MD;  Location: [facility] END[facility];  Service: Endoscopy;  Laterality: N/A;   ESOPHAGOGASTRODUODENOSCOPY (EGD) WITH PROPOFOL N/A 10/29/2016   Procedure: ESOPHAGOGASTRODUODENOSCOPY (EGD) WITH PROPOFOL;  Surgeon: Laurence Spates, MD;  Location: [facility];  Service: Endoscopy;  Laterality: N/A;   ESOPHAGOGASTRODUODENOSCOPY (EGD) WITH PROPOFOL N/A 10/04/2019   Procedure: ESOPHAGOGASTRODUODENOSCOPY (EGD) WITH PROPOFOL;  Surgeon: Wilford Corner, MD;  Location: [facility];  Service: Endoscopy;  Laterality: N/A;   flank ablation     IR PARACENTESIS  01/12/2019   IR PARACENTESIS  05/02/2019   IR PARACENTESIS  08/03/2019   IRRIGATION AND DEBRIDEMENT KNEE Left 03/14/2013   Procedure: IRRIGATION AND DEBRIDEMENT  and Closure of wound left KNEE;  Surgeon: Mauri Pole, MD;  Location: [facility] ORS;  Service: Orthopedics;  Laterality: Left;   PATELLA-FEMORAL ARTHROPLASTY Left 03/07/2013   Procedure: LEFT PATELLA-FEMORAL ARTHROPLASTY;  Surgeon: Mauri Pole, MD;  Location: [facility];  Service: Orthopedics;  Laterality: Left;   SAVORY DILATION N/A 10/29/2016   Procedure: SAVORY DILATION;  Surgeon: Laurence Spates, MD;  Location: [facility];  Service: Endoscopy;  Laterality: N/A;    Social History:  reports that she has been smoking cigarettes. She has been smoking an average of 1.5 packs per day. She has never used smokeless tobacco. She reports that she does not drink alcohol and does not use drugs.  Allergies  Allergen Reactions   Spironolactone Nausea Only    nausea   Gabapentin Other (See Comments)    sleepwalking    Sulfamethoxazole-Trimethoprim Hives   Amoxicillin Nausea And Vomiting and Other (See Comments)    tolerated augmentin in the past (03/2013) without issue. Did it involve swelling of the face/tongue/throat, SOB, or low BP? No Did it involve sudden or severe rash/hives, skin peeling, or any reaction on the inside of your mouth or nose? No Did you need to seek medical attention at a hospital or doctor's office? No When did it last happen? unk       If all above answers are "NO", may proceed with cephalosporin use.    Clindamycin/Lincomycin Nausea And Vomiting   Doxycycline Nausea And Vomiting   Treximet [Sumatriptan-Naproxen Sodium] Nausea And Vomiting   Bactrim Hives and Rash    Family History  Problem Relation Age of Onset   Stroke Mother    Cancer - Lung Father    Heart attack Sister    Alcohol abuse Sister    Breast cancer Maternal Aunt    Hypertension Brother      Prior to Admission medications   Medication Sig Start Date End Date Taking?  Authorizing Provider  acetaminophen (TYLENOL) 500 MG tablet Take 1,000 mg by mouth every 6 (six) hours as needed for moderate pain.    [provider]  albuterol (PROVENTIL HFA;VENTOLIN HFA) 108 (90 BASE) MCG/ACT inhaler Inhale 2 puffs into the lungs every 4 (four) hours as needed for wheezing or shortness of breath.     [provider]  albuterol (PROVENTIL) (2.5 MG/3ML) 0.083% nebulizer solution Take 2.5 mg by nebulization every 6 (six) hours as needed for wheezing or shortness of breath.    [provider]  ALPRAZolam Duanne Moron) 1 MG tablet Take 2 mg by mouth at bedtime.    [provider]  amLODipine (NORVASC) 5 MG tablet TAKE 1 TABLET BY MOUTH IN THE MORNING AND AT BEDTIME Patient taking differently: Take 5 mg by mouth daily. 04/16/21   Bhagat, [name], PA  benazepril (LOTENSIN) 40 MG tablet Take 40 mg by mouth daily.     [provider]  Benzocaine-Resorcinol (VAGISIL EX) Apply 1 application  topically daily.    [provider]  cloNIDine (CATAPRES) 0.1 MG tablet Take 0.2 mg by mouth 2 (two) times daily as needed (blood pressure). 02/05/21   [provider]  docusate sodium (COLACE) 250 MG capsule Take 250 mg by mouth daily as needed for constipation.    [provider]  escitalopram (LEXAPRO) 20 MG tablet Take 20 mg by mouth every evening.  04/06/17   [provider]  estradiol (ESTRACE) 2 MG tablet Take 2 mg by mouth daily.     [provider]  fluticasone (FLONASE) 50 MCG/ACT nasal spray Place 1 spray into both nostrils daily as needed for allergies.    [provider]  Glycerin-Polysorbate 80 (REFRESH DRY EYE THERAPY OP) Place 1 drop into both eyes daily as needed (dry eyes).    [provider]  hydrOXYzine (ATARAX/VISTARIL) 25 MG tablet Take 25 mg by mouth every 8 (eight) hours as needed for anxiety.    [provider]  levocetirizine (XYZAL) 5 MG tablet Take 5 mg by mouth every evening.  09/16/19   [provider]  lidocaine (XYLOCAINE) 5 % ointment Apply 1 application topically 2 (two) times daily as needed (hemorrhoids).    [provider]  meloxicam (MOBIC) 15 MG tablet Take 15 mg by mouth daily as needed for pain.  09/16/19   [provider]  metFORMIN (GLUCOPHAGE) 500 MG tablet Take 2 tablets (1,000 mg total) by mouth 2 (two) times daily with a meal. Patient taking differently: Take 500 mg by mouth daily as needed (for sugar). 12/13/14   Thurnell Lose, MD  naloxone [facility]) 4 MG/0.1ML LIQD nasal spray kit Place 1 spray into the nose as needed (for overdose).     [provider]  nystatin (MYCOSTATIN/NYSTOP) powder Apply 1 g topically daily as needed (irritation. (Typically during Summer months)).     [provider]  oxyCODONE (ROXICODONE) 15 MG immediate release tablet Take 1 tablet (15 mg total) by mouth every 8 (eight) hours as needed for pain. Patient taking  differently: Take 15 mg by mouth every 4 (four) hours. 03/29/21   [name], MD  pantoprazole (PROTONIX) 40 MG tablet Take 40 mg by mouth 2 (two) times daily.     [provider]  PHAZYME MAXIMUM STRENGTH 250 MG CAPS Take 250 mg by mouth 2 (two) times daily as needed (gas relief).    [provider]  polyethylene glycol (MIRALAX / GLYCOLAX) 17 g packet Take 17  g by mouth daily as needed for moderate constipation. 03/28/21   [name], MD  psyllium (METAMUCIL) 58.6 % packet Take 1 packet by mouth daily as needed (regularity).     [provider]  simvastatin (ZOCOR) 20 MG tablet Take 20 mg by mouth at bedtime.    [provider]  SYMBICORT 160-4.5 MCG/ACT inhaler Inhale 2 puffs into the lungs in the morning and at bedtime.  02/09/20   [provider]  torsemide (DEMADEX) 20 MG tablet Take 40 mg by mouth daily as needed (fluid).  12/30/18   [provider]  traZODone (DESYREL) 50 MG tablet Take 50 mg by mouth at bedtime. 01/15/21   [provider]  triamcinolone cream (KENALOG) 0.1 % Apply 1 application topically 2 (two) times daily as needed (rash).    [provider]    Physical Exam: Vitals:   08/11/22 2153 08/11/22 2156 08/11/22 2240 08/11/22 2340  BP:   (!) 160/78 117/71  Pulse: 98 96 95 99  Resp: (!) _0 Temp:   99.7 F (37.6 C) (!) 101 F (38.3 C)  TempSrc:   Oral Oral  SpO2: (!) 86% 94% 96% 98%  Weight:    110.1 kg  Height:    _1  (1.575 m)   Constitutional: Resting supine in bed, NAD, calm, comfortable Eyes: EOMI, lids and conjunctivae normal ENMT: Mucous membranes are moist. Posterior pharynx clear of any exudate or lesions.Normal dentition.  Neck: normal, supple, no masses. Respiratory: clear to auscultation bilaterally, no wheezing, no crackles. Normal respiratory effort. No accessory muscle use.  Cardiovascular: Regular rate and rhythm, no murmurs / rubs / gallops. No extremity  edema. 2+ pedal pulses. Abdomen: Distended somewhat taut abdomen, mild generalized tenderness. Musculoskeletal: no clubbing / cyanosis. No joint deformity upper and lower extremities. Good ROM, no contractures. Normal muscle tone.  Skin: no rashes, lesions, ulcers. No induration Neurologic: Sensation intact. Strength equal bilaterally. Psychiatric: Alert and oriented x 3. Normal mood.   EKG: Personally reviewed. Sinus rhythm, rate 96, no acute ischemic changes.  Similar to prior.  Assessment/Plan Principal Problem:   Sepsis ([place]) Active Problems:   Cirrhosis of liver with ascites ([name])   COPD (chronic obstructive pulmonary disease) ([name])   Chronic respiratory failure with hypoxia ([name])   Type 2 diabetes mellitus with obesity ([name])   Hypertension associated with diabetes ([name])   Chronic, continuous use of opioids   Tobacco use   Hyperlipidemia associated with type 2 diabetes mellitus ([name])   Depression with anxiety   OSA (obstructive sleep apnea)   [name] is a 61 y.o. female with medical history significant for CAD, COPD, cirrhosis due to NASH, T2DM, HTN, HLD, depression/anxiety, anemia, depression/anxiety, chronic back and abdominal pain on opioids, chronic benzodiazepine use, OSA not using CPAP who is admitted with sepsis.  Assessment and Plan: * Sepsis ([place]) Presenting with leukocytosis, fever, tachycardia, tachypnea.  CT shows possible early LUL PNA.  She is not having any respiratory symptoms.  She is ascites on imaging with primary complaint of abdominal pain concerning for SBP.  COVID and flu negative.  UA negative for UTI. -Continue IV cefepime -Add azithromycin -Follow blood cultures -US paracentesis with labs ordered for a.m. -S/p IV fluid resuscitation in the ED, hold further maintenance fluids  Cirrhosis of liver with ascites ([place]) History of [place] cirrhosis presenting with abdominal pain, distention, ascites.  Concern for infection/SBP as above.  Mentating well on  admission. -US paracentesis -Consider adjustment  in home meds to add spironolactone/Lasix  COPD (chronic obstructive pulmonary disease) ([name]) Chronic respiratory failure with hypoxia Stable, no wheezing on admission.  Previously using home O2 but states that she cannot afford it any longer. -Continue Dulera, albuterol as needed -Supplemental O2 as needed  Type 2 diabetes mellitus with obesity ([place]) Hold home meds and placed on SSI.  Hypertension associated with diabetes ([place]) BP stable.  Hold home amlodipine and benazepril.  Consider change to spironolactone/Lasix regimen given history of cirrhosis with ascites.  Chronic, continuous use of opioids Continue home oxycodone 15 mg every 4 hours with hold parameters.  OSA (obstructive sleep apnea) Has not been using CPAP since her home equipment broke several years ago.  Has not had repeat sleep study.  Depression with anxiety Continue Lexapro and home Xanax 2 mg qhs.  Hyperlipidemia associated with type 2 diabetes mellitus ([name]) Continue simvastatin.  Tobacco use Reports vaping.  Declines nicotine patch.  DVT prophylaxis: enoxaparin (LOVENOX) injection 40 mg Start: 08/12/22 2200 Code Status: Full code, confirmed with patient on admission  Family Communication: Discussed with patient, she has discussed with family Disposition Plan: From home and likely discharge to home pending clinical progress Consults called: None Severity of Illness: The appropriate patient status for this patient is INPATIENT. Inpatient status is judged to be reasonable and necessary in order to provide the required intensity of service to ensure the patient's safety. The patient's presenting symptoms, physical exam findings, and initial radiographic and laboratory data in the context of their chronic comorbidities is felt to place them at high risk for further clinical deterioration. Furthermore, it is not anticipated that the patient will be medically stable  for discharge from the hospital within 2 midnights of admission.   * I certify that at the point of admission it is my clinical judgment that the patient will require inpatient hospital care spanning beyond 2 midnights from the point of admission due to high intensity of service, high risk for further deterioration and high frequency of surveillance required.Zada Finders MD [facility]  If 7PM-7AM, please contact night-coverage [email or web address]  08/12/2022, 1:09 AM

## 2022-08-12 NOTE — Assessment & Plan Note (Signed)
Continue simvastatin. 

## 2022-08-13 DIAGNOSIS — J189 Pneumonia, unspecified organism: Secondary | ICD-10-CM | POA: Diagnosis not present

## 2022-08-13 DIAGNOSIS — K652 Spontaneous bacterial peritonitis: Secondary | ICD-10-CM | POA: Insufficient documentation

## 2022-08-13 LAB — CBC WITH DIFFERENTIAL/PLATELET
Abs Immature Granulocytes: 0.07 10*3/uL (ref 0.00–0.07)
Basophils Absolute: 0 10*3/uL (ref 0.0–0.1)
Basophils Relative: 0 %
Eosinophils Absolute: 0.2 10*3/uL (ref 0.0–0.5)
Eosinophils Relative: 2 %
HCT: 35.7 % — ABNORMAL LOW (ref 36.0–46.0)
Hemoglobin: 10.7 g/dL — ABNORMAL LOW (ref 12.0–15.0)
Immature Granulocytes: 1 %
Lymphocytes Relative: 17 %
Lymphs Abs: 2.4 10*3/uL (ref 0.7–4.0)
MCH: 24.5 pg — ABNORMAL LOW (ref 26.0–34.0)
MCHC: 30 g/dL (ref 30.0–36.0)
MCV: 81.7 fL (ref 80.0–100.0)
Monocytes Absolute: 1.1 10*3/uL — ABNORMAL HIGH (ref 0.1–1.0)
Monocytes Relative: 8 %
Neutro Abs: 10.4 10*3/uL — ABNORMAL HIGH (ref 1.7–7.7)
Neutrophils Relative %: 72 %
Platelets: 167 10*3/uL (ref 150–400)
RBC: 4.37 MIL/uL (ref 3.87–5.11)
RDW: 15.7 % — ABNORMAL HIGH (ref 11.5–15.5)
WBC: 14.3 10*3/uL — ABNORMAL HIGH (ref 4.0–10.5)
nRBC: 0 % (ref 0.0–0.2)

## 2022-08-13 LAB — COMPREHENSIVE METABOLIC PANEL
ALT: 16 U/L (ref 0–44)
AST: 24 U/L (ref 15–41)
Albumin: 2.6 g/dL — ABNORMAL LOW (ref 3.5–5.0)
Alkaline Phosphatase: 83 U/L (ref 38–126)
Anion gap: 6 (ref 5–15)
BUN: 8 mg/dL (ref 8–23)
CO2: 27 mmol/L (ref 22–32)
Calcium: 7.8 mg/dL — ABNORMAL LOW (ref 8.9–10.3)
Chloride: 100 mmol/L (ref 98–111)
Creatinine, Ser: 0.8 mg/dL (ref 0.44–1.00)
GFR, Estimated: >60 mL/min (ref >60)
Glucose, Bld: 137 mg/dL — ABNORMAL HIGH (ref 70–99)
Potassium: 3.5 mmol/L (ref 3.5–5.1)
Sodium: 133 mmol/L — ABNORMAL LOW (ref 135–145)
Total Bilirubin: 0.6 mg/dL (ref 0.3–1.2)
Total Protein: 7.5 g/dL (ref 6.5–8.1)

## 2022-08-13 LAB — GLUCOSE, CAPILLARY
Glucose-Capillary: 167 mg/dL — ABNORMAL HIGH (ref 70–99)
Glucose-Capillary: 119 mg/dL — ABNORMAL HIGH (ref 70–99)
Glucose-Capillary: 140 mg/dL — ABNORMAL HIGH (ref 70–99)
Glucose-Capillary: 170 mg/dL — ABNORMAL HIGH (ref 70–99)

## 2022-08-13 MED ORDER — SPIRONOLACTONE 25 MG PO TABS
25.0000 mg | ORAL_TABLET | Freq: Every day | ORAL | Status: DC
  Administered 2022-08-14: 25 mg via ORAL
  Filled 2022-08-13: qty 1

## 2022-08-13 MED ORDER — FUROSEMIDE 20 MG PO TABS
20.0000 mg | ORAL_TABLET | Freq: Every day | ORAL | Status: DC
  Administered 2022-08-13 – 2022-08-16 (×4): 20 mg via ORAL
  Filled 2022-08-13 (×4): qty 1

## 2022-08-13 MED ORDER — HYDROMORPHONE HCL 1 MG/ML IJ SOLN
0.5000 mg | INTRAMUSCULAR | Status: DC | PRN
  Administered 2022-08-13 – 2022-08-14 (×4): 0.5 mg via INTRAVENOUS
  Filled 2022-08-13 (×5): qty 1

## 2022-08-13 MED ORDER — POTASSIUM CHLORIDE CRYS ER 20 MEQ PO TBCR
20.0000 meq | EXTENDED_RELEASE_TABLET | Freq: Once | ORAL | Status: AC
  Administered 2022-08-13: 20 meq via ORAL
  Filled 2022-08-13: qty 1

## 2022-08-13 NOTE — Evaluation (Signed)
Physical Therapy Evaluation & Discharge Patient Details Name: [name] MRN: [id number] DOB: [birth year] Today's Date: 08/13/2022  History of Present Illness  Pt is a 61 y.o. female who presented 08/11/22 with x5 day hx of abdominal pain, admitted for sepsis. CT shows possible early LUL PNA. S/p paracentesis L lower quadrant 11/28. PMHx:  asthma, CAD, chronic back and abdominal pain on opioids, cirrhosis due to NASH, COPD, depression, DM2, obesity, OSA, HTN, HLD, ascites   Clinical Impression  Pt presents with condition above. PTA, she was independent with functional mobility, primarily without AD unless she was having pain then she would use a cane. She lives with her husband in a 1-level house with 3 STE. Currently, pt reports to be and appears to be at her baseline with functional mobility. Pt was able to ambulate without UE support, navigate stairs with bil handrails, and perform transfers and bed mobility without LOB or assistance. Pt is limited primarily by her "sciatic" pain and abdominal pain. Provided education to manage her back pain. She does demonstrate some deficits in endurance and reports difficulty maintaining her stability with longer community distances. Pt reporting her knees buckle when she fatigues with community mobility, thus recommending a rollator at d/c. All education completed and questions answered. PT will sign off.     Recommendations for follow up therapy are one component of a multi-disciplinary discharge planning process, led by the attending physician.  Recommendations may be updated based on patient status, additional functional criteria and insurance authorization.  Follow Up Recommendations No PT follow up      Assistance Recommended at Discharge PRN  Patient can return home with the following  Assist for transportation;Help with stairs or ramp for entrance;Assistance with cooking/housework    Equipment Recommendations Rollator (4 wheels)   Recommendations for Other Services       Functional Status Assessment Patient has not had a recent decline in their functional status     Precautions / Restrictions Precautions Precautions: None Restrictions Weight Bearing Restrictions: No      Mobility  Bed Mobility Overal bed mobility: Modified Independent             General bed mobility comments: Pt able to transition supine > sit L EOB with HOB elevated without assistance    Transfers Overall transfer level: Needs assistance Equipment used: None Transfers: Sit to/from Stand, Bed to chair/wheelchair/BSC Sit to Stand: Supervision   Step pivot transfers: Supervision       General transfer comment: Semi-slow to rise and stand step bed > commode in tight space, but no LOB, supervision for safety    Ambulation/Gait Ambulation/Gait assistance: Min guard, Supervision Gait Distance (Feet): 280 Feet Assistive device: None Gait Pattern/deviations: Step-through pattern, Decreased stride length Gait velocity: WFL Gait velocity interpretation: >2.62 ft/sec, indicative of community ambulatory   General Gait Details: Pt with a functional gait speed, reporting she is ambulating faster currently than she typically chooses to at baseline. No LOB, slightly stiff due to pain in abdomen and "sciatic" pain. Min guard initially, progressed to supervision quickly.  Stairs Stairs: Yes Stairs assistance: Min guard Stair Management: Two rails, Step to pattern, Forwards Number of Stairs: 3 General stair comments: Ascends with bil rails forward with step-to pattern, descends with bil rails forward with slight turn with step-to pattern. Slow, but no LOB, min guard for safety  Wheelchair Mobility    Modified Rankin (Stroke Patients Only)       Balance Overall balance assessment: No apparent balance deficits (  not formally assessed)                                           Pertinent Vitals/Pain Pain  Assessment Pain Assessment: Faces Faces Pain Scale: Hurts little more Pain Location: abdomen, bil legs Pain Descriptors / Indicators: Discomfort, Grimacing, Guarding, Shooting Pain Intervention(s): Limited activity within patient's tolerance, Monitored during session, Repositioned, Patient requesting pain meds-RN notified, RN gave pain meds during session    Home Living Family/patient expects to be discharged to:: Private residence Living Arrangements: Spouse/significant other Available Help at Discharge: Family;Available PRN/intermittently Type of Home: House Home Access: Stairs to enter Entrance Stairs-Rails: Right;Left;Can reach both Entrance Stairs-Number of Steps: 3   Home Layout: One level Home Equipment: Other (comment) ([email or web address]) Additional Comments: info carried over from entry 06/02/22 as pt denied any changes in home info    Prior Function Prior Level of Function : Independent/Modified Independent             Mobility Comments: IND without AD majority of time, but when having pain she sometimes uses her cane ADLs Comments: some standby assist for showering. Patient no longer drives and husband assists with IADLs (info carried over from entry 06/02/22 as pt denied any changes)     Hand Dominance        Extremity/Trunk Assessment   Upper Extremity Assessment Upper Extremity Assessment: Overall WFL for tasks assessed    Lower Extremity Assessment Lower Extremity Assessment: Overall WFL for tasks assessed    Cervical / Trunk Assessment Cervical / Trunk Assessment: Other exceptions Cervical / Trunk Exceptions: increased body habitus  Communication   Communication: No difficulties  Cognition Arousal/Alertness: Awake/alert Behavior During Therapy: WFL for tasks assessed/performed Overall Cognitive Status: Within Functional Limits for tasks assessed                                          General Comments General comments (skin integrity,  edema, etc.): VSS on RA; [employer] HEP handout and education provided to manage back pain- access code: Access Code: [facility]; encouraged pt to mobilize with staff more frequently and further    Exercises     Assessment/Plan    PT Assessment Patient does not need any further PT services  PT Problem List Decreased activity tolerance       PT Treatment Interventions DME instruction;Gait training;Stair training;Functional mobility training;Therapeutic activities;Therapeutic exercise;Balance training;Neuromuscular re-education;Patient/family education    PT Goals (Current goals can be found in the Care Plan section)  Acute Rehab PT Goals Patient Stated Goal: to feel better PT Goal Formulation: All assessment and education complete, DC therapy Time For Goal Achievement: 08/14/22 Potential to Achieve Goals: Good    Frequency       Co-evaluation               AM-PAC PT "6 Clicks" Mobility  Outcome Measure Help needed turning from your back to your side while in a flat bed without using bedrails?: None Help needed moving from lying on your back to sitting on the side of a flat bed without using bedrails?: None Help needed moving to and from a bed to a chair (including a wheelchair)?: A Little Help needed standing up from a chair using your arms (e.g., wheelchair or bedside chair)?: A Little Help needed  to walk in hospital room?: A Little Help needed climbing 3-5 steps with a railing? : A Little 6 Click Score: 20    End of Session Equipment Utilized During Treatment: Gait belt Activity Tolerance: Patient tolerated treatment well Patient left: in chair;with call bell/phone within reach;with chair alarm set Nurse Communication: Mobility status;Patient requests pain meds PT Visit Diagnosis: Other abnormalities of gait and mobility (R26.89);Pain Pain - Right/Left:  (bil) Pain - part of body: Leg (and abdomen)    Time: 9672-8979 PT Time Calculation (min) (ACUTE ONLY): 32  min   Charges:   PT Evaluation $PT Eval Low Complexity: 1 Low PT Treatments $Therapeutic Activity: 8-22 mins        Moishe Spice, PT, DPT Acute Rehabilitation Services  Office: [phone]   Orvan Falconer 08/13/2022, 1:56 PM

## 2022-08-13 NOTE — Progress Notes (Signed)
Mobility Specialist - Progress Note   08/13/22 0946  Mobility  Activity Stood at bedside;Transferred to/from [facility]  Level of Assistance Standby assist, set-up cues, supervision of patient - no hands on  Assistive Device None  Activity Response Tolerated poorly  Mobility Referral Yes  $Mobility charge 1 Mobility    Post-mobility:94 HR  Pt received in bed  and moaning d/t pain. Pt was c/o leg, glute, and stomach pain. Pt was agreeable to stand at bedside but deferred further mobility d/t pain. While standing at bedside pt requested assistance to [facility]. Pt was independent for peri care. Pt was returned to bed with all needs met.   Franki Monte  Mobility Specialist Please contact via Solicitor or Rehab office at [phone]

## 2022-08-13 NOTE — Consult Note (Addendum)
[facility] Consult  Referring Provider: No ref. provider found Primary Care Physician:  Shirline Frees, MD Primary Gastroenterologist: Dr. Michail Sermon [facility])  Reason for Consultation: spontaneous bacterial peritonitis  SUBJECTIVE:   HPI: [name] is a 61 y.o. female who presented to [facility] on 08/11/22 with chief complaint of worsening of her chronic abdominal pain. 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 She has history of diabetes mellitus and COPD. She was last seen in office at [facility] on 02/12/21 for follow up after emergency department visit for severe lower extremity edema and constipation. Constipation improved with doses of Miralax. At that visit, she was recommended to use Miralax and dulcolax regularly given her chronic narcotic use for pain relief. She was provided Rocephin IM in office for right lower extremity cellulitis and recommended to continue on torsemide therapy for lower extremity edema. Last EGD and colonoscopy completed on 10/04/2019 for generalized abdominal pain and personal history colon polyps and - findings were significant for normal appearing esophagus with no varices, mild portal hypertensive gastropathy, localized mild inflammation in pre-pyloric region of the stomach, normal duodenum, grade I internal hemorrhoids, otherwise normal colonoscopy. Last paracenteses completed on 08/03/2019, 05/02/2019 and 01/12/2019.   Patient noted that abdominal pain in bilateral flanks is chronic, she appears to be on chronic opiate mediations for this. She noted having worsened abdominal pain (generalized) starting on 08/07/22. She has chronic nausea, no vomiting. She has dark brown bowel movements, no melena. She did not feel febrile prior to admission.   On presentation, febrile to 101.2 F, tachycardic, tachypneic. Labs showed Na 135, K 3.9, AST/ALT 24/15, ALP 77, total  bilirubin 1.3, WBC 16.5, Hgb 9.6, PLT 168, lactic acid 1.4. CT scan of abdomen and pelvis completed and showed nodular liver, moderate free fluid, moderate stool, tree in budd pattern in left upper lobe. Paracentesis was completed on 08/12/22 with showed SAAG 1.2, total protein 4.1, PMN count 1,316. Her most recent echocardiogram was completed on 05/31/22 and showed ejection fraction 70-75%. She is on torsemide prior to admission. She recently had amlodipine added to her medication regimen given hypertension. No SBP prophylaxis on prior to admission medications.   Past Medical History:  Diagnosis Date   Abdominal pain 01/12/2019   Abdominal pain, generalized 07/10/2014   Acute on chronic respiratory failure with hypoxia ([name]) 10/18/2018   Anxiety    Arthritis    Ascites 05/01/2019   NALD   Asthma    CAD (coronary artery disease)    cath 2010 with 60-70% left Cx and normal LVF   Chronic low back pain with left-sided sciatica 10/15/2015   Chronic pain    Chronic prescription benzodiazepine use 05/01/2019   Chronic, continuous use of opioids 05/01/2019   Cirrhosis of liver not due to alcohol ([place]) 05/01/2019   Cirrhosis of liver: per CT 07/11/2014   Constipation by delayed colonic transit 12/09/2014   COPD (chronic obstructive pulmonary disease) ([place])    COPD exacerbation ([place]) 10/12/2016   Dehiscence of surgical wound left knee 03/16/2013   Depression    Depression with anxiety 01/11/2019   Deviated septum    Diabetes mellitus without complication ([place])    type 2   Generalized abdominal pain 12/09/2014   GERD (gastroesophageal reflux disease)    Headache(784.0)    migraines   Hepatic cirrhosis ([place]) 06/2014   HLD (hyperlipidemia) 01/11/2019   Hypercholesterolemia    Hypertension    Hypertension associated  with diabetes ([place])    Ileus ([place]) 07/11/2014   Influenza A 10/2018   Lobar pneumonia ([place])    Microcytic anemia 01/11/2019   Migraine headache 12/09/2014   Morbid obesity ([place]) 03/09/2013    Obesity    OSA (obstructive sleep apnea) 01/11/2019   Pars defect with spondylolisthesis 05/01/2019   Perforated bowel ([place])    PONV (postoperative nausea and vomiting)    Respiratory failure, acute-on-chronic ([place]) 12/11/2010   S/P left PF UKR 03/07/2013   Seizures ([place])    as a little girl 61 years old   Shortness of breath    Sleep apnea    dont wear bipap . lost weight   Spondylolisthesis, grade 2    Tobacco abuse    Type 2 diabetes mellitus with obesity ([place]) 01/11/2019   Past Surgical History:  Procedure Laterality Date   ABDOMINAL EXPLORATION SURGERY     primary repair of colon perforation from [facility]   ABDOMINAL HYSTERECTOMY     total   BACK SURGERY     lumbar   COLONOSCOPY WITH PROPOFOL N/A 10/04/2019   Procedure: COLONOSCOPY WITH PROPOFOL;  Surgeon: Wilford Corner, MD;  Location: [facility] END[facility]COPY;  Service: Endoscopy;  Laterality: N/A;   ESOPHAGOGASTRODUODENOSCOPY (EGD) WITH PROPOFOL N/A 10/29/2016   Procedure: ESOPHAGOGASTRODUODENOSCOPY (EGD) WITH PROPOFOL;  Surgeon: Laurence Spates, MD;  Location: [facility];  Service: Endoscopy;  Laterality: N/A;   ESOPHAGOGASTRODUODENOSCOPY (EGD) WITH PROPOFOL N/A 10/04/2019   Procedure: ESOPHAGOGASTRODUODENOSCOPY (EGD) WITH PROPOFOL;  Surgeon: Wilford Corner, MD;  Location: [facility];  Service: Endoscopy;  Laterality: N/A;   flank ablation     IR PARACENTESIS  01/12/2019   IR PARACENTESIS  05/02/2019   IR PARACENTESIS  08/03/2019   IR PARACENTESIS  08/12/2022   IRRIGATION AND DEBRIDEMENT KNEE Left 03/14/2013   Procedure: IRRIGATION AND DEBRIDEMENT  and Closure of wound left KNEE;  Surgeon: Mauri Pole, MD;  Location: [facility] ORS;  Service: Orthopedics;  Laterality: Left;   PATELLA-FEMORAL ARTHROPLASTY Left 03/07/2013   Procedure: LEFT PATELLA-FEMORAL ARTHROPLASTY;  Surgeon: Mauri Pole, MD;  Location: [facility];  Service: Orthopedics;  Laterality: Left;   SAVORY DILATION N/A 10/29/2016   Procedure: SAVORY DILATION;  Surgeon: Laurence Spates,  MD;  Location: [facility];  Service: Endoscopy;  Laterality: N/A;   Prior to Admission medications   Medication Sig Start Date End Date Taking? Authorizing Provider  acetaminophen (TYLENOL) 500 MG tablet Take 1,000 mg by mouth every 6 (six) hours as needed for moderate pain.   Yes [provider]  albuterol (PROVENTIL HFA;VENTOLIN HFA) 108 (90 BASE) MCG/ACT inhaler Inhale 2 puffs into the lungs every 6 (six) hours as needed for wheezing or shortness of breath.   Yes [provider]  albuterol (PROVENTIL) (2.5 MG/3ML) 0.083% nebulizer solution Take 2.5 mg by nebulization every 6 (six) hours as needed for wheezing or shortness of breath.   Yes [provider]  ALPRAZolam Duanne Moron) 1 MG tablet Take by mouth See admin instructions. Take one tablet (1 mg) by mouth in the morning and two tablets (2 mg) in the evening.   Yes [provider]  amLODipine (NORVASC) 5 MG tablet TAKE 1 TABLET BY MOUTH IN THE MORNING AND AT BEDTIME Patient taking differently: Take 10 mg by mouth daily. 04/16/21  Yes Bhagat, Bhavinkumar, PA  augmented betamethasone dipropionate (DIPROLENE-AF) 0.05 % cream Apply 1 Application topically daily.   Yes [provider]  baclofen (LIORESAL) 10 MG tablet Take 10 mg by mouth 2 (two) times daily  as needed for muscle spasms.   Yes [provider]  benazepril (LOTENSIN) 40 MG tablet Take 40 mg by mouth daily. benazepril (LOTENSIN) hcl 40 MG tablet   Yes [provider]  Benzocaine-Resorcinol (VAGISIL EX) Apply 1 application topically daily.   Yes [provider]  budesonide (PULMICORT) 1 MG/2ML nebulizer solution Take 1 mg by nebulization daily.   Yes [provider]  cloNIDine (CATAPRES) 0.2 MG tablet Take 0.2 mg by mouth 2 (two) times daily. 02/05/21  Yes [provider]  cyclobenzaprine (FLEXERIL) 10 MG tablet Take 10 mg by mouth 3 (three) times daily as needed for muscle spasms.   Yes [provider]  diclofenac Sodium (VOLTAREN) 1 % GEL Apply 1 Application topically 4 (four) times daily.   Yes [provider]  docusate sodium (COLACE) 250 MG capsule Take 250 mg by mouth daily as needed for constipation.   Yes [provider]  escitalopram (LEXAPRO) 20 MG tablet Take 20 mg by mouth every evening.  04/06/17  Yes [provider]  estradiol (ESTRACE) 2 MG tablet Take 2 mg by mouth daily.    Yes [provider]  fluticasone (FLONASE) 50 MCG/ACT nasal spray Place 1 spray into both nostrils daily as needed for allergies.   Yes [provider]  Glycerin-Polysorbate 80 (REFRESH DRY EYE THERAPY OP) Place 1 drop into both eyes daily as needed (dry eyes).   Yes [provider]  hydrOXYzine (ATARAX/VISTARIL) 25 MG tablet Take 25 mg by mouth every 8 (eight) hours as needed for anxiety.   Yes [provider]  ipratropium-albuterol (DUONEB) 0.5-2.5 (3) MG/3ML SOLN Take 3 mLs by nebulization every 6 (six) hours as needed (shortness of breath).   Yes [provider]  levocetirizine (XYZAL) 5 MG tablet Take 5 mg by mouth every evening.  09/16/19  Yes [provider]  lidocaine (XYLOCAINE) 5 % ointment Apply 1 application topically 2 (two) times daily as needed (hemorrhoids).   Yes [provider]  metFORMIN (GLUCOPHAGE) 500 MG tablet Take 2 tablets (1,000 mg total) by mouth 2 (two) times daily with a meal. Patient taking differently: Take 500 mg by mouth daily as needed (for sugar). 12/13/14  Yes Thurnell Lose, MD  mupirocin cream (BACTROBAN) 2 % Apply 1 Application topically 2 (two) times daily.   Yes [provider]  Naproxen Sodium 220 MG CAPS Take 220 mg by mouth every 12 (twelve) hours. As needed   Yes [provider]  nystatin (MYCOSTATIN/NYSTOP) powder Apply 1 g topically daily as needed (irritation. (Typically during Summer months)).    Yes [provider]  ondansetron  (ZOFRAN) 8 MG tablet Take 8 mg by mouth every 8 (eight) hours as needed for nausea or vomiting.   Yes [provider]  oxyCODONE (ROXICODONE) 15 MG immediate release tablet Take 1 tablet (15 mg total) by mouth every 8 (eight) hours as needed for pain. Patient taking differently: Take 15 mg by mouth every 4 (four) hours. 03/29/21  Yes [name], MD  pantoprazole (PROTONIX) 40 MG tablet Take 40 mg by mouth 2 (two) times daily.    Yes [provider]  PHAZYME MAXIMUM STRENGTH 250 MG CAPS Take 250 mg by mouth 2 (two) times daily as needed (gas relief).   Yes [provider]  polyethylene glycol (MIRALAX / GLYCOLAX) 17 g packet Take 17 g by mouth daily as needed for moderate constipation. 03/28/21  Yes [name], MD  prochlorperazine (COMPAZINE)  10 MG tablet Take 10 mg by mouth every 8 (eight) hours as needed for nausea or vomiting.   Yes [provider]  promethazine (PHENERGAN) 25 MG tablet Take 25 mg by mouth every 8 (eight) hours as needed for nausea or vomiting. hcl   Yes [provider]  rizatriptan (MAXALT-MLT) 10 MG disintegrating tablet Take 10 mg by mouth as needed for migraine. May repeat in 2 hours if needed Rizatriptan benzoate 51m tablet disintegrating   Yes [provider]  simvastatin (ZOCOR) 20 MG tablet Take 20 mg by mouth at bedtime.   Yes [provider]  SYMBICORT 160-4.5 MCG/ACT inhaler Inhale 2 puffs into the lungs in the morning and at bedtime.  02/09/20  Yes [provider]  topiramate (TOPAMAX) 50 MG tablet Take 50 mg by mouth daily.   Yes [provider]  torsemide (DEMADEX) 20 MG tablet Take 40 mg by mouth daily as needed (fluid).  12/30/18  Yes [provider]  traZODone (DESYREL) 50 MG tablet Take 50 mg by mouth at bedtime as needed for sleep. 01/15/21  Yes [provider]  triamcinolone cream (KENALOG) 0.1 % Apply 1 application topically 2 (two) times daily as  needed (rash).   Yes [provider]  meloxicam (MOBIC) 15 MG tablet Take 15 mg by mouth daily as needed for pain.  Patient not taking: Reported on 08/12/2022 09/16/19   [provider]  naloxone ([name] 4 MG/0.1ML LIQD nasal spray kit Place 1 spray into the nose as needed (for overdose).     [provider]  SEMAGLUTIDE-WEIGHT MANAGEMENT [place] Inject into the skin. Ozempic    [provider]   Current Facility-Administered Medications  Medication Dose Route Frequency Provider Last Rate Last Admin   acetaminophen (TYLENOL) tablet 650 mg  650 mg Oral Q6H PRN PLenore Cordia MD   650 mg at 08/12/22 1103   Or   acetaminophen (TYLENOL) suppository 650 mg  650 mg Rectal Q6H PRN PZada FindersR, MD       albuterol (PROVENTIL) (2.5 MG/3ML) 0.083% nebulizer solution 2.5 mg  2.5 mg Nebulization Q6H PRN PLenore Cordia MD       ALPRAZolam (Duanne Moron tablet 2 mg  2 mg Oral QHS PZada FindersR, MD   2 mg at 08/12/22 2153   azithromycin (ZITHROMAX) 500 mg in sodium chloride 0.9 % 250 mL IVPB  500 mg Intravenous Q24H PZada FindersR, MD 250 mL/hr at 08/13/22 0555 500 mg at 08/13/22 0555   ceFEPIme (MAXIPIME) 2 g in sodium chloride 0.9 % 100 mL IVPB  2 g Intravenous Q8H Rumbarger, Rachel L, RPH 200 mL/hr at 08/13/22 0906 2 g at 08/13/22 0906   cyclobenzaprine (FLEXERIL) tablet 10 mg  10 mg Oral TID PRN Mansy, Jan A, MD   10 mg at 08/12/22 2358   enoxaparin (LOVENOX) injection 40 mg  40 mg Subcutaneous Q24H PZada FindersR, MD   40 mg at 08/12/22 2135   escitalopram (LEXAPRO) tablet 20 mg  20 mg Oral QPM PZada FindersR, MD   20 mg at 08/12/22 1800   HYDROmorphone (DILAUDID) injection 0.5 mg  0.5 mg Intravenous Q4H PRN Regalado, Belkys A, MD   0.5 mg at 08/13/22 1419   hydrOXYzine (ATARAX) tablet 25 mg  25 mg Oral Q8H PRN PZada FindersR, MD       insulin aspart (novoLOG) injection 0-9 Units  0-9 Units Subcutaneous TID WC PLenore Cordia MD   2 Units  at 08/13/22 0906    mometasone-formoterol (DULERA) 200-5 MCG/ACT inhaler 2 puff  2 puff Inhalation BID Lenore Cordia, MD       ondansetron (ZOFRAN) tablet 4 mg  4 mg Oral Q6H PRN Lenore Cordia, MD       Or   ondansetron (ZOFRAN) injection 4 mg  4 mg Intravenous Q6H PRN Lenore Cordia, MD       oxyCODONE (Oxy IR/ROXICODONE) immediate release tablet 15 mg  15 mg Oral Q4H Zada Finders R, MD   15 mg at 08/13/22 1618   pantoprazole (PROTONIX) EC tablet 40 mg  40 mg Oral BID Lenore Cordia, MD   40 mg at 08/13/22 9024   senna-docusate (Senokot-S) tablet 1 tablet  1 tablet Oral BID Patrecia Pour, MD   1 tablet at 08/13/22 0973   simethicone (MYLICON) chewable tablet 160 mg  160 mg Oral QID PRN Mansy, Jan A, MD   160 mg at 08/12/22 2205   simvastatin (ZOCOR) tablet 20 mg  20 mg Oral QHS Zada Finders R, MD   20 mg at 08/12/22 2138   sodium chloride flush (NS) 0.9 % injection 3 mL  3 mL Intravenous Q12H Zada Finders R, MD   3 mL at 08/12/22 2200   traZODone (DESYREL) tablet 50 mg  50 mg Oral QHS Zada Finders R, MD   50 mg at 08/12/22 2139   Allergies as of 08/11/2022 - Review Complete 08/11/2022  Allergen Reaction Noted   Spironolactone Nausea Only 02/16/2020   Gabapentin Other (See Comments) 08/15/2019   Sulfamethoxazole-trimethoprim Hives 08/15/2019   Amoxicillin Nausea And Vomiting and Other (See Comments) 11/09/2011   Clindamycin/lincomycin Nausea And Vomiting 11/09/2011   Doxycycline Nausea And Vomiting 11/09/2011   Treximet [sumatriptan-naproxen sodium] Nausea And Vomiting 11/09/2011   Bactrim Hives and Rash 11/09/2011   Family History  Problem Relation Age of Onset   Stroke Mother    Cancer - Lung Father    Heart attack Sister    Alcohol abuse Sister    Breast cancer Maternal Aunt    Hypertension Brother    Social History   Socioeconomic History   Marital status: Married    Spouse name: Not on file   Number of children: 1   Years of education: some coll.   Highest education level: Not on  file  Occupational History   Occupation: disability  Tobacco Use   Smoking status: Some Days    Packs/day: 1.50    Types: Cigarettes   Smokeless tobacco: Never   Tobacco comments:    quit 14 days and Daughter had drug relapse and pt. smoked  Vaping Use   Vaping Use: Not on file  Substance and Sexual Activity   Alcohol use: No   Drug use: No   Sexual activity: Never  Other Topics Concern   Not on file  Social History Narrative   Patient drinks very little caffeine.   Patient is right handed.    Social Determinants of Health   Financial Resource Strain: Not on file  Food Insecurity: No Food Insecurity (08/12/2022)   Hunger Vital Sign    Worried About Running Out of Food in the Last Year: Never true    Ran Out of Food in the Last Year: Never true  Recent Concern: Food Insecurity - Food Insecurity Present (06/02/2022)   Hunger Vital Sign    Worried About Running Out of Food in the Last Year: Often true    Ran Out of  Food in the Last Year: Sometimes true  Transportation Needs: No Transportation Needs (08/12/2022)   PRAPARE - Hydrologist (Medical): No    Lack of Transportation (Non-Medical): No  Recent Concern: Transportation Needs - Unmet Transportation Needs (05/30/2022)   PRAPARE - Hydrologist (Medical): Yes    Lack of Transportation (Non-Medical): Yes  Physical Activity: Not on file  Stress: Not on file  Social Connections: Not on file  Intimate Partner Violence: Not At Risk (08/12/2022)   Humiliation, Afraid, Rape, and Kick questionnaire    Fear of Current or Ex-Partner: No    Emotionally Abused: No    Physically Abused: No    Sexually Abused: No   Review of Systems:  Review of Systems  Constitutional:  Negative for fever.  Respiratory:  Negative for shortness of breath.   Cardiovascular:  Negative for chest pain.  Gastrointestinal:  Positive for abdominal pain and nausea. Negative for blood in stool,  melena and vomiting.    OBJECTIVE:   Temp:  [98.5 F (36.9 C)-98.6 F (37 C)] 98.5 F (36.9 C) (11/29 1644) Pulse Rate:  [86-94] 86 (11/29 1644) Resp:  [15-20] 17 (11/29 1644) BP: (143-166)/(54-63) 166/54 (11/29 1644) SpO2:  [96 %-98 %] 98 % (11/29 1644) Last BM Date : 08/12/22 Physical Exam Constitutional:      General: She is not in acute distress.    Appearance: She is not ill-appearing, toxic-appearing or diaphoretic.  Cardiovascular:     Rate and Rhythm: Normal rate and regular rhythm.  Pulmonary:     Effort: No respiratory distress.     Breath sounds: Normal breath sounds.  Abdominal:     General: Bowel sounds are normal. There is no distension.     Palpations: Abdomen is soft.     Tenderness: There is abdominal tenderness (diffuse). There is no guarding.  Musculoskeletal:     Right lower leg: No edema.     Left lower leg: No edema.  Skin:    General: Skin is warm and dry.  Neurological:     Mental Status: She is alert.     Labs: Recent Labs    08/11/22 0803 08/12/22 0246 08/13/22 0350  WBC 14.2* 16.5* 14.3*  HGB 10.7* 9.6* 10.7*  HCT 34.9* 31.3* 35.7*  PLT 188 168 167   BMET Recent Labs    08/11/22 0803 08/12/22 0246 08/13/22 0350  NA 133* 135 133*  K 4.6 3.9 3.5  CL 96* 97* 100  CO2 _0 GLUCOSE 161* 125* 137*  BUN 8 7* 8  CREATININE 0.79 0.79 0.80  CALCIUM 8.9 8.1* 7.8*   LFT Recent Labs    08/13/22 0350  PROT 7.5  ALBUMIN 2.6*  AST 24  ALT 16  ALKPHOS 83  BILITOT 0.6   PT/INR Recent Labs    08/11/22 0800 08/12/22 0246  LABPROT 14.9 17.3*  INR 1.2 1.4*   Diagnostic imaging: IR Paracentesis  Result Date: 08/12/2022 INDICATION: Patient history of NASH cirrhosis recurrent ascites. Request to IR for diagnostic and therapeutic paracentesis. EXAM: ULTRASOUND GUIDED DIAGNOSTIC AND THERAPEUTIC PARACENTESIS MEDICATIONS: 8 mL 1% lidocaine COMPLICATIONS: None immediate. PROCEDURE: Informed written consent was obtained from the  patient after a discussion of the risks, benefits and alternatives to treatment. A timeout was performed prior to the initiation of the procedure. Initial ultrasound scanning demonstrates a moderate amount of ascites within the left lower abdominal quadrant. The left lower abdomen was prepped and draped  in the usual sterile fashion. 1% lidocaine was used for local anesthesia. Following this, a 19 gauge, 10-cm, Yueh catheter was introduced. An ultrasound image was saved for documentation purposes. The paracentesis was performed. The catheter was removed and a dressing was applied. The patient tolerated the procedure well without immediate post procedural complication. FINDINGS: A total of approximately 1.9 L of clear yellow fluid was removed. Samples were sent to the laboratory as requested by the clinical team. IMPRESSION: Successful ultrasound-guided paracentesis yielding 1.9 liters of peritoneal fluid. Read by Candiss Norse, PA-C PLAN: If the patient eventually requires >/=2 paracenteses in a 30 day period, candidacy for formal evaluation by the [facility] will be assessed. Electronically Signed   By: Ruthann Cancer M.D.   On: 08/12/2022 11:31    IMPRESSION: 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  -Decompensated by ascites, on torsemide prior to admission  -First episode spontaneous bacterial peritonitis, paracentesis 08/12/22 with SAAG 1.2, total protein 4.1, PMN 1,316  -Last EGD/COL completed 10/04/2019, no esophageal varices  -No history hepatic encephalopathy  -No enhancing liver lesion on delayed contrast imaging 08/11/22  -Last hepatitis A and B testing in 2015 Chronic abdominal pain  Community acquired pneumonia Chronic obstructive pulmonary disease Diabetes mellitus  PLAN: -Recommend continued antibiotic therapy for spontaneous bacterial peritonitis, cefepime OK, 5-7 days -No role for IV albumin given no elevation in  total bilirubin, no elevation in BUN or creatinine -Recommend repeat paracentesis in 48 hours to re-evaluate PMN count in fluid -Total protein in ascitic fluid is elevated, unexpected in cirrhosis, would recommend updating echocardiogram -Diuretic therapy OK from GI standpoint -Recommend secondary SBP prophylaxis on discharge, ciprofloxacin 500 mg PO daily if able -Recommend update AFP -Recommend update hepatitis A and B serology, if non-immune recommend vaccination -Recommend outpatient follow up with Dr. Michail Sermon to discuss timing for next EGD -Monitor bowel movements, may require bowel regimen given opiate medication -[facility] will follow   LOS: 2 days   Danton Clap, [facility]
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
## 2022-08-14 ENCOUNTER — Inpatient Hospital Stay (HOSPITAL_COMMUNITY): Payer: Medicare Other

## 2022-08-14 DIAGNOSIS — A419 Sepsis, unspecified organism: Secondary | ICD-10-CM | POA: Diagnosis not present

## 2022-08-14 DIAGNOSIS — R188 Other ascites: Secondary | ICD-10-CM | POA: Diagnosis not present

## 2022-08-14 DIAGNOSIS — K7460 Unspecified cirrhosis of liver: Secondary | ICD-10-CM

## 2022-08-14 DIAGNOSIS — R0609 Other forms of dyspnea: Secondary | ICD-10-CM

## 2022-08-14 DIAGNOSIS — J189 Pneumonia, unspecified organism: Secondary | ICD-10-CM

## 2022-08-14 HISTORY — PX: IR PARACENTESIS: IMG2679

## 2022-08-14 LAB — HEPATITIS B SURFACE ANTIGEN: Hepatitis B Surface Ag: NONREACTIVE

## 2022-08-14 LAB — BASIC METABOLIC PANEL
Anion gap: 11 (ref 5–15)
BUN: 6 mg/dL — ABNORMAL LOW (ref 8–23)
CO2: 30 mmol/L (ref 22–32)
Calcium: 8.3 mg/dL — ABNORMAL LOW (ref 8.9–10.3)
Chloride: 98 mmol/L (ref 98–111)
Creatinine, Ser: 0.75 mg/dL (ref 0.44–1.00)
GFR, Estimated: >60 mL/min (ref >60)
Glucose, Bld: 130 mg/dL — ABNORMAL HIGH (ref 70–99)
Potassium: 4.3 mmol/L (ref 3.5–5.1)
Sodium: 139 mmol/L (ref 135–145)

## 2022-08-14 LAB — ECHOCARDIOGRAM COMPLETE
AR max vel: 2.43 cm2
AV Area VTI: 2.6 cm2
AV Area mean vel: 2.42 cm2
AV Mean grad: 6 mmHg
AV Peak grad: 11.4 mmHg
Ao pk vel: 1.69 m/s
Area-P 1/2: 3.72 cm2
Height: 62 in
MV VTI: 2.45 cm2
S' Lateral: 3.2 cm
Weight: 3883.2 oz

## 2022-08-14 LAB — CBC
HCT: 38.6 % (ref 36.0–46.0)
Hemoglobin: 11.3 g/dL — ABNORMAL LOW (ref 12.0–15.0)
MCH: 24.2 pg — ABNORMAL LOW (ref 26.0–34.0)
MCHC: 29.3 g/dL — ABNORMAL LOW (ref 30.0–36.0)
MCV: 82.7 fL (ref 80.0–100.0)
Platelets: 203 10*3/uL (ref 150–400)
RBC: 4.67 MIL/uL (ref 3.87–5.11)
RDW: 15.9 % — ABNORMAL HIGH (ref 11.5–15.5)
WBC: 9.2 10*3/uL (ref 4.0–10.5)
nRBC: 0 % (ref 0.0–0.2)

## 2022-08-14 LAB — ALBUMIN, PLEURAL OR PERITONEAL FLUID: Albumin, Fluid: 1.6 g/dL

## 2022-08-14 LAB — AMMONIA: Ammonia: 66 umol/L — ABNORMAL HIGH (ref 9–35)

## 2022-08-14 LAB — HEPATITIS B CORE ANTIBODY, IGM: Hep B C IgM: NONREACTIVE

## 2022-08-14 LAB — GRAM STAIN

## 2022-08-14 LAB — BODY FLUID CELL COUNT WITH DIFFERENTIAL
Eos, Fluid: 1 %
Lymphs, Fluid: 77 %
Monocyte-Macrophage-Serous Fluid: 12 % — ABNORMAL LOW (ref 50–90)
Neutrophil Count, Fluid: 10 % (ref 0–25)
Total Nucleated Cell Count, Fluid: 595 cu mm (ref 0–1000)

## 2022-08-14 LAB — GLUCOSE, CAPILLARY
Glucose-Capillary: 171 mg/dL — ABNORMAL HIGH (ref 70–99)
Glucose-Capillary: 204 mg/dL — ABNORMAL HIGH (ref 70–99)
Glucose-Capillary: 168 mg/dL — ABNORMAL HIGH (ref 70–99)
Glucose-Capillary: 130 mg/dL — ABNORMAL HIGH (ref 70–99)

## 2022-08-14 LAB — MAGNESIUM: Magnesium: 1.8 mg/dL (ref 1.7–2.4)

## 2022-08-14 LAB — CYTOLOGY - NON PAP

## 2022-08-14 LAB — HEPATITIS A ANTIBODY, TOTAL: hep A Total Ab: NONREACTIVE

## 2022-08-14 MED ORDER — HYDROMORPHONE HCL 1 MG/ML IJ SOLN
0.5000 mg | Freq: Once | INTRAMUSCULAR | Status: AC
  Administered 2022-08-14: 0.5 mg via INTRAVENOUS
  Filled 2022-08-14: qty 1

## 2022-08-14 MED ORDER — OXYCODONE HCL 5 MG PO TABS
15.0000 mg | ORAL_TABLET | ORAL | Status: DC | PRN
  Administered 2022-08-14 – 2022-08-16 (×11): 15 mg via ORAL
  Filled 2022-08-14 (×12): qty 3

## 2022-08-14 MED ORDER — MAGNESIUM SULFATE 2 GM/50ML IV SOLN
2.0000 g | Freq: Once | INTRAVENOUS | Status: AC
  Administered 2022-08-14: 2 g via INTRAVENOUS
  Filled 2022-08-14: qty 50

## 2022-08-14 MED ORDER — PERFLUTREN LIPID MICROSPHERE
1.0000 mL | INTRAVENOUS | Status: AC | PRN
  Administered 2022-08-14: 3 mL via INTRAVENOUS

## 2022-08-14 MED ORDER — OXYCODONE HCL 5 MG PO TABS: 15.0000 mg | ORAL_TABLET | Freq: Three times a day (TID) | ORAL | Status: DC

## 2022-08-14 MED ORDER — HYDROMORPHONE HCL 1 MG/ML IJ SOLN
0.5000 mg | Freq: Three times a day (TID) | INTRAMUSCULAR | Status: DC | PRN
  Administered 2022-08-14 – 2022-08-15 (×3): 0.5 mg via INTRAVENOUS
  Filled 2022-08-14 (×3): qty 1

## 2022-08-14 MED ORDER — SODIUM CHLORIDE 0.9 % IV SOLN: INTRAVENOUS | Status: DC | PRN

## 2022-08-14 MED ORDER — LIDOCAINE HCL 1 % IJ SOLN
INTRAMUSCULAR | Status: AC
  Filled 2022-08-14: qty 20

## 2022-08-14 MED ORDER — LACTULOSE 10 GM/15ML PO SOLN
30.0000 g | Freq: Three times a day (TID) | ORAL | Status: DC
  Administered 2022-08-14 (×2): 30 g via ORAL
  Filled 2022-08-14 (×2): qty 45

## 2022-08-14 NOTE — Progress Notes (Signed)
[facility] night cross cover note:   I was contacted by patient's RN regarding the patient's complaint of breakthrough abdominal and leg discomfort, after prn IV Dilaudid was decreased today, now 0.5 mg IV every 8 hours prn.  Patient requesting a one-time dose of pain medication for this breakthrough discomfort.  I subsequently placed order for a one-time dose of Dilaudid 0.5 mg IV.      Babs Bertin, DO Hospitalist

## 2022-08-14 NOTE — Procedures (Signed)
PROCEDURE SUMMARY:  Successful image-guided paracentesis from the right lateral abdomen.  Yielded 1 liter of hazy yellow fluid.  No immediate complications.  EBL = trace. Patient tolerated well.   Specimen was sent for labs.  Please see imaging section of Epic for full dictation.  This is  the second paracentesis in past 30 days, however, the second paracentesis was ordered mainly for diagnostic purpose as patient had elevated PMN count but no organism on gram stain.  Patient has not been arranged for [facility] [place] PA-C 08/14/2022 1:13 PM

## 2022-08-14 NOTE — Progress Notes (Signed)
[facility] Progress Note  SUBJECTIVE:   Interval history: [name] was seen and evaluated today at bedside.  Resting comfortably in bed.  Noted that she has significant abdominal pain.  She had specific questions regarding alterations that were made in her pain regimen.  She was falling asleep on my exam today.  Nausea is improved from overnight.  She had no vomiting.  She reported a cough.  No shortness of breath.  No chest pain.  She had a large dark brown bowel movement yesterday.  Past Medical History:  Diagnosis Date   Abdominal pain 01/12/2019   Abdominal pain, generalized 07/10/2014   Acute on chronic respiratory failure with hypoxia ([name]) 10/18/2018   Anxiety    Arthritis    Ascites 05/01/2019   NALD   Asthma    CAD (coronary artery disease)    cath 2010 with 60-70% left Cx and normal LVF   Chronic low back pain with left-sided sciatica 10/15/2015   Chronic pain    Chronic prescription benzodiazepine use 05/01/2019   Chronic, continuous use of opioids 05/01/2019   Cirrhosis of liver not due to alcohol ([place]) 05/01/2019   Cirrhosis of liver: per CT 07/11/2014   Constipation by delayed colonic transit 12/09/2014   COPD (chronic obstructive pulmonary disease) ([name])    COPD exacerbation ([place]) 10/12/2016   Dehiscence of surgical wound left knee 03/16/2013   Depression    Depression with anxiety 01/11/2019   Deviated septum    Diabetes mellitus without complication ([name])    type 2   Generalized abdominal pain 12/09/2014   GERD (gastroesophageal reflux disease)    Headache(784.0)    migraines   Hepatic cirrhosis ([place]) 06/2014   HLD (hyperlipidemia) 01/11/2019   Hypercholesterolemia    Hypertension    Hypertension associated with diabetes ([place])    Ileus ([place]) 07/11/2014   Influenza A 10/2018   Lobar pneumonia ([place])    Microcytic anemia 01/11/2019   Migraine headache 12/09/2014   Morbid obesity ([place]) 03/09/2013   Obesity    OSA (obstructive sleep apnea) 01/11/2019   Pars  defect with spondylolisthesis 05/01/2019   Perforated bowel ([place])    PONV (postoperative nausea and vomiting)    Respiratory failure, acute-on-chronic ([place]) 12/11/2010   S/P left PF UKR 03/07/2013   Seizures ([place])    as a little girl 61 years old   Shortness of breath    Sleep apnea    dont wear bipap . lost weight   Spondylolisthesis, grade 2    Tobacco abuse    Type 2 diabetes mellitus with obesity ([place]) 01/11/2019   Past Surgical History:  Procedure Laterality Date   ABDOMINAL EXPLORATION SURGERY     primary repair of colon perforation from [facility]   ABDOMINAL HYSTERECTOMY     total   BACK SURGERY     lumbar   COLONOSCOPY WITH PROPOFOL N/A 10/04/2019   Procedure: COLONOSCOPY WITH PROPOFOL;  Surgeon: Wilford Corner, MD;  Location: [facility];  Service: Endoscopy;  Laterality: N/A;   ESOPHAGOGASTRODUODENOSCOPY (EGD) WITH PROPOFOL N/A 10/29/2016   Procedure: ESOPHAGOGASTRODUODENOSCOPY (EGD) WITH PROPOFOL;  Surgeon: Laurence Spates, MD;  Location: [facility];  Service: Endoscopy;  Laterality: N/A;   ESOPHAGOGASTRODUODENOSCOPY (EGD) WITH PROPOFOL N/A 10/04/2019   Procedure: ESOPHAGOGASTRODUODENOSCOPY (EGD) WITH PROPOFOL;  Surgeon: Wilford Corner, MD;  Location: [facility];  Service: Endoscopy;  Laterality: N/A;   flank ablation     IR PARACENTESIS  01/12/2019   IR PARACENTESIS  05/02/2019   IR PARACENTESIS  08/03/2019  IR PARACENTESIS  08/12/2022   IRRIGATION AND DEBRIDEMENT KNEE Left 03/14/2013   Procedure: IRRIGATION AND DEBRIDEMENT  and Closure of wound left KNEE;  Surgeon: Mauri Pole, MD;  Location: [facility];  Service: Orthopedics;  Laterality: Left;   PATELLA-FEMORAL ARTHROPLASTY Left 03/07/2013   Procedure: LEFT PATELLA-FEMORAL ARTHROPLASTY;  Surgeon: Mauri Pole, MD;  Location: [facility];  Service: Orthopedics;  Laterality: Left;   SAVORY DILATION N/A 10/29/2016   Procedure: SAVORY DILATION;  Surgeon: Laurence Spates, MD;  Location: [facility];  Service: Endoscopy;  Laterality:  N/A;   Current Facility-Administered Medications  Medication Dose Route Frequency Provider Last Rate Last Admin   acetaminophen (TYLENOL) tablet 650 mg  650 mg Oral Q6H PRN Lenore Cordia, MD   650 mg at 08/14/22 0820   Or   acetaminophen (TYLENOL) suppository 650 mg  650 mg Rectal Q6H PRN Lenore Cordia, MD       albuterol (PROVENTIL) (2.5 MG/3ML) 0.083% nebulizer solution 2.5 mg  2.5 mg Nebulization Q6H PRN Lenore Cordia, MD       ALPRAZolam Duanne Moron) tablet 2 mg  2 mg Oral QHS Zada Finders R, MD   2 mg at 08/13/22 2023   azithromycin (ZITHROMAX) 500 mg in sodium chloride 0.9 % 250 mL IVPB  500 mg Intravenous Q24H Zada Finders R, MD 250 mL/hr at 08/14/22 0429 500 mg at 08/14/22 0429   ceFEPIme (MAXIPIME) 2 g in sodium chloride 0.9 % 100 mL IVPB  2 g Intravenous Q8H Rumbarger, Rachel L, RPH 200 mL/hr at 08/14/22 0947 2 g at 08/14/22 0947   cyclobenzaprine (FLEXERIL) tablet 10 mg  10 mg Oral TID PRN Mansy, Jan A, MD   10 mg at 08/14/22 1150   enoxaparin (LOVENOX) injection 40 mg  40 mg Subcutaneous Q24H Zada Finders R, MD   40 mg at 08/13/22 2028   escitalopram (LEXAPRO) tablet 20 mg  20 mg Oral QPM Lenore Cordia, MD   20 mg at 08/13/22 1807   furosemide (LASIX) tablet 20 mg  20 mg Oral Daily Regalado, Belkys A, MD   20 mg at 08/14/22 0820   HYDROmorphone (DILAUDID) injection 0.5 mg  0.5 mg Intravenous Q8H PRN Regalado, Belkys A, MD       hydrOXYzine (ATARAX) tablet 25 mg  25 mg Oral Q8H PRN Zada Finders R, MD       insulin aspart (novoLOG) injection 0-9 Units  0-9 Units Subcutaneous TID WC Lenore Cordia, MD   3 Units at 08/14/22 1204   lactulose (CHRONULAC) 10 GM/15ML solution 30 g  30 g Oral TID Regalado, Belkys A, MD       lidocaine (XYLOCAINE) 1 % (with pres) injection            mometasone-formoterol (DULERA) 200-5 MCG/ACT inhaler 2 puff  2 puff Inhalation BID Lenore Cordia, MD   2 puff at 08/14/22 0806   ondansetron (ZOFRAN) tablet 4 mg  4 mg Oral Q6H PRN Lenore Cordia, MD        Or   ondansetron (ZOFRAN) injection 4 mg  4 mg Intravenous Q6H PRN Zada Finders R, MD       oxyCODONE (Oxy IR/ROXICODONE) immediate release tablet 15 mg  15 mg Oral Q4H PRN Regalado, Belkys A, MD   15 mg at 08/14/22 1204   pantoprazole (PROTONIX) EC tablet 40 mg  40 mg Oral BID Lenore Cordia, MD   40 mg at 08/14/22 0820   senna-docusate (Senokot-S) tablet  1 tablet  1 tablet Oral BID Patrecia Pour, MD   1 tablet at 08/14/22 0820   simethicone (MYLICON) chewable tablet 160 mg  160 mg Oral QID PRN Mansy, Jan A, MD   160 mg at 08/12/22 2205   simvastatin (ZOCOR) tablet 20 mg  20 mg Oral QHS Zada Finders R, MD   20 mg at 08/13/22 2024   sodium chloride flush (NS) 0.9 % injection 3 mL  3 mL Intravenous Q12H Zada Finders R, MD   3 mL at 08/14/22 0820   traZODone (DESYREL) tablet 50 mg  50 mg Oral QHS Zada Finders R, MD   50 mg at 08/13/22 2023   Allergies as of 08/11/2022 - Review Complete 08/11/2022  Allergen Reaction Noted   Spironolactone Nausea Only 02/16/2020   Gabapentin Other (See Comments) 08/15/2019   Sulfamethoxazole-trimethoprim Hives 08/15/2019   Amoxicillin Nausea And Vomiting and Other (See Comments) 11/09/2011   Clindamycin/lincomycin Nausea And Vomiting 11/09/2011   Doxycycline Nausea And Vomiting 11/09/2011   Treximet [sumatriptan-naproxen sodium] Nausea And Vomiting 11/09/2011   Bactrim Hives and Rash 11/09/2011   Review of Systems:  Review of Systems  Respiratory:  Positive for cough. Negative for shortness of breath.   Cardiovascular:  Negative for chest pain.  Gastrointestinal:  Positive for abdominal pain. Negative for blood in stool, constipation, diarrhea, nausea and vomiting.    OBJECTIVE:   Temp:  [98.3 F (36.8 C)-98.6 F (37 C)] 98.3 F (36.8 C) (11/30 1407) Pulse Rate:  [85-91] 91 (11/30 1407) Resp:  [17-18] 18 (11/30 1407) BP: (125-166)/(54-65) 139/65 (11/30 1407) SpO2:  [94 %-98 %] 95 % (11/30 1407) Last BM Date : 08/12/22 Physical  Exam Constitutional:      General: She is not in acute distress.    Appearance: She is not ill-appearing, toxic-appearing or diaphoretic.     Comments: Intermittently somnolent though will awaken to conversation  Cardiovascular:     Rate and Rhythm: Normal rate and regular rhythm.  Pulmonary:     Effort: No respiratory distress.     Breath sounds: Normal breath sounds.  Abdominal:     General: Bowel sounds are normal. There is no distension.     Palpations: Abdomen is soft.     Tenderness: There is abdominal tenderness (Diffuse).  Skin:    General: Skin is warm and dry.     Labs: Recent Labs    08/12/22 0246 08/13/22 0350 08/14/22 0728  WBC 16.5* 14.3* 9.2  HGB 9.6* 10.7* 11.3*  HCT 31.3* 35.7* 38.6  PLT 168 167 203   BMET Recent Labs    08/12/22 0246 08/13/22 0350 08/14/22 0728  NA 135 133* 139  K 3.9 3.5 4.3  CL 97* 100 98  CO2 28 27 30   GLUCOSE 125* 137* 130*  BUN 7* 8 6*  CREATININE 0.79 0.80 0.75  CALCIUM 8.1* 7.8* 8.3*   LFT Recent Labs    08/13/22 0350  PROT 7.5  ALBUMIN 2.6*  AST 24  ALT 16  ALKPHOS 83  BILITOT 0.6   PT/INR Recent Labs    08/12/22 0246  LABPROT 17.3*  INR 1.4*   Diagnostic imaging: ECHOCARDIOGRAM COMPLETE  Result Date: 08/14/2022    ECHOCARDIOGRAM REPORT   Patient Name:   SHAUNTE TUFT Date of Exam: 08/14/2022 Medical Rec #:  [id number]    Height:       62.0 in Accession #:    [id number]   Weight:       242.7 lb Date  of Birth:  May 26, 1961    BSA:          2.076 m Patient Age:    1 years     BP:           125/61 mmHg Patient Gender: F            HR:           86 bpm. Exam Location:  Inpatient Procedure: 2D Echo, Cardiac Doppler, Color Doppler and Intracardiac            Opacification Agent Indications:    Dyspnea  History:        Patient has prior history of Echocardiogram examinations, most                 recent 05/31/2022. CAD, COPD; Risk Factors:Hypertension,                 Diabetes, Dyslipidemia and Sleep Apnea.   Sonographer:    Clayton Lefort RDCS (AE) Referring Phys: Elmarie Shiley  Sonographer Comments: Technically difficult study due to poor echo windows, patient is obese and no subcostal window. Image acquisition challenging due to patient body habitus. IMPRESSIONS  1. Left ventricular ejection fraction, by estimation, is 60 to 65%. The left ventricle has normal function. The left ventricle has no regional wall motion abnormalities. Left ventricular diastolic parameters are consistent with Grade II diastolic dysfunction (pseudonormalization).  2. Right ventricular systolic function is normal. The right ventricular size is normal. Tricuspid regurgitation signal is inadequate for assessing PA pressure.  3. No evidence of mitral valve regurgitation.  4. The aortic valve was not well visualized. Aortic valve regurgitation is not visualized.  5. The inferior vena cava is normal in size with greater than 50% respiratory variability, suggesting right atrial pressure of 3 mmHg. Comparison(s): No significant change from prior study. FINDINGS  Left Ventricle: Left ventricular ejection fraction, by estimation, is 60 to 65%. The left ventricle has normal function. The left ventricle has no regional wall motion abnormalities. Definity contrast agent was given IV to delineate the left ventricular  endocardial borders. The left ventricular internal cavity size was normal in size. There is no left ventricular hypertrophy. Left ventricular diastolic parameters are consistent with Grade II diastolic dysfunction (pseudonormalization). Right Ventricle: The right ventricular size is normal. Right ventricular systolic function is normal. Tricuspid regurgitation signal is inadequate for assessing PA pressure. Left Atrium: Left atrial size was normal in size. Right Atrium: Right atrial size was normal in size. Pericardium: There is no evidence of pericardial effusion. Mitral Valve: No evidence of mitral valve regurgitation. MV peak gradient,  8.2 mmHg. The mean mitral valve gradient is 3.0 mmHg. Tricuspid Valve: Tricuspid valve regurgitation is not demonstrated. Aortic Valve: The aortic valve was not well visualized. Aortic valve regurgitation is not visualized. Aortic valve mean gradient measures 6.0 mmHg. Aortic valve peak gradient measures 11.4 mmHg. Aortic valve area, by VTI measures 2.60 cm. Pulmonic Valve: Pulmonic valve regurgitation is not visualized. Aorta: The aortic root and ascending aorta are structurally normal, with no evidence of dilitation. Venous: The inferior vena cava is normal in size with greater than 50% respiratory variability, suggesting right atrial pressure of 3 mmHg. IAS/Shunts: The interatrial septum was not well visualized.  LEFT VENTRICLE PLAX 2D LVIDd:         5.00 cm   Diastology LVIDs:         3.20 cm   LV e' medial:    9.57 cm/s LV PW:  1.10 cm   LV E/e' medial:  14.4 LV IVS:        1.10 cm   LV e' lateral:   8.49 cm/s LVOT diam:     1.90 cm   LV E/e' lateral: 16.3 LV SV:         84 LV SV Index:   40 LVOT Area:     2.84 cm  RIGHT VENTRICLE RV Basal diam:  3.20 cm RV S prime:     16.50 cm/s TAPSE (M-mode): 2.2 cm LEFT ATRIUM             Index        RIGHT ATRIUM           Index LA diam:        3.60 cm 1.73 cm/m   RA Area:     16.70 cm LA Vol (A2C):   55.4 ml 26.69 ml/m  RA Volume:   41.80 ml  20.14 ml/m LA Vol (A4C):   71.0 ml 34.21 ml/m LA Biplane Vol: 63.4 ml 30.55 ml/m  AORTIC VALVE AV Area (Vmax):    2.43 cm AV Area (Vmean):   2.42 cm AV Area (VTI):     2.60 cm AV Vmax:           169.00 cm/s AV Vmean:          117.000 cm/s AV VTI:            0.323 m AV Peak Grad:      11.4 mmHg AV Mean Grad:      6.0 mmHg LVOT Vmax:         145.00 cm/s LVOT Vmean:        99.800 cm/s LVOT VTI:          0.296 m LVOT/AV VTI ratio: 0.92  AORTA Ao Root diam: 2.50 cm Ao Asc diam:  3.10 cm MITRAL VALVE MV Area (PHT): 3.72 cm     SHUNTS MV Area VTI:   2.45 cm     Systemic VTI:  0.30 m MV Peak grad:  8.2 mmHg     Systemic  Diam: 1.90 cm MV Mean grad:  3.0 mmHg MV Vmax:       1.43 m/s MV Vmean:      85.8 cm/s MV Decel Time: 204 msec MV E velocity: 138.00 cm/s MV A velocity: 77.30 cm/s MV E/A ratio:  1.79 Landscape architect signed by Phineas Inches Signature Date/Time: 08/14/2022/9:44:23 AM    Final     IMPRESSION: 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             -Decompensated by ascites, on torsemide prior to admission             -First episode spontaneous bacterial peritonitis, paracentesis 08/12/22 with SAAG 1.2, total protein 4.1, PMN 1,316             -Last EGD/COL completed 10/04/2019, no esophageal varices             -No history hepatic encephalopathy             -No enhancing liver lesion on delayed contrast imaging 08/11/22             -Last hepatitis A and B testing in 2015 Chronic abdominal pain  Community acquired pneumonia Chronic obstructive pulmonary disease Diabetes mellitus  PLAN: -Repeat paracentesis completed today, follow-up fluid studies -Echocardiogram completed today showing left ventricular ejection fraction 60-65% -Hepatitis A and B serologies unremarkable, recommend  vaccination in the outpatient setting -Continue IV antibiotics as ordered -Follow-up AFP testing -Recommend bowel regimen with senna and Miralax, would discontinue lactulose given ability to cause abdominal cramping -Recommend outpatient follow-up with gastroenterology, Dr. Pamalee Leyden [facility] will follow   LOS: 3 days   Danton Clap, [facility]

## 2022-08-14 NOTE — Progress Notes (Signed)
  Echocardiogram 2D Echocardiogram has been performed.  [name] 08/14/2022, 8:43 AM
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
## 2022-08-14 NOTE — Progress Notes (Signed)
\  Mobility Specialist Progress Note   08/14/22 1522  Mobility  Activity Refused mobility   Patient declined despite max encouragement secondary to soreness and pain in abdomen. Stated she just woke from sedation and is too sore to do anything at this time. Will continue to follow.   [name], BS EXP Mobility Specialist Please contact via SecureChat or Rehab office at [phone]

## 2022-08-15 ENCOUNTER — Inpatient Hospital Stay (HOSPITAL_COMMUNITY): Payer: Medicare Other

## 2022-08-15 DIAGNOSIS — K7460 Unspecified cirrhosis of liver: Secondary | ICD-10-CM | POA: Diagnosis not present

## 2022-08-15 DIAGNOSIS — R188 Other ascites: Secondary | ICD-10-CM | POA: Diagnosis not present

## 2022-08-15 DIAGNOSIS — A419 Sepsis, unspecified organism: Secondary | ICD-10-CM | POA: Diagnosis not present

## 2022-08-15 DIAGNOSIS — R52 Pain, unspecified: Secondary | ICD-10-CM

## 2022-08-15 LAB — CBC
HCT: 37.1 % (ref 36.0–46.0)
Hemoglobin: 11.1 g/dL — ABNORMAL LOW (ref 12.0–15.0)
MCH: 24.7 pg — ABNORMAL LOW (ref 26.0–34.0)
MCHC: 29.9 g/dL — ABNORMAL LOW (ref 30.0–36.0)
MCV: 82.6 fL (ref 80.0–100.0)
Platelets: 178 10*3/uL (ref 150–400)
RBC: 4.49 MIL/uL (ref 3.87–5.11)
RDW: 15.8 % — ABNORMAL HIGH (ref 11.5–15.5)
WBC: 6.7 10*3/uL (ref 4.0–10.5)
nRBC: 0 % (ref 0.0–0.2)

## 2022-08-15 LAB — BASIC METABOLIC PANEL
Anion gap: 8 (ref 5–15)
BUN: <5 mg/dL — ABNORMAL LOW (ref 8–23)
CO2: 32 mmol/L (ref 22–32)
Calcium: 7.8 mg/dL — ABNORMAL LOW (ref 8.9–10.3)
Chloride: 97 mmol/L — ABNORMAL LOW (ref 98–111)
Creatinine, Ser: 0.64 mg/dL (ref 0.44–1.00)
GFR, Estimated: >60 mL/min (ref >60)
Glucose, Bld: 227 mg/dL — ABNORMAL HIGH (ref 70–99)
Potassium: 4.2 mmol/L (ref 3.5–5.1)
Sodium: 137 mmol/L (ref 135–145)

## 2022-08-15 LAB — GLUCOSE, CAPILLARY
Glucose-Capillary: 115 mg/dL — ABNORMAL HIGH (ref 70–99)
Glucose-Capillary: 207 mg/dL — ABNORMAL HIGH (ref 70–99)
Glucose-Capillary: 124 mg/dL — ABNORMAL HIGH (ref 70–99)
Glucose-Capillary: 282 mg/dL — ABNORMAL HIGH (ref 70–99)

## 2022-08-15 LAB — AFP TUMOR MARKER: AFP, Serum, Tumor Marker: 2.6 ng/mL (ref 0.0–9.2)

## 2022-08-15 MED ORDER — CEFDINIR 300 MG PO CAPS
300.0000 mg | ORAL_CAPSULE | Freq: Two times a day (BID) | ORAL | Status: DC
  Administered 2022-08-16: 300 mg via ORAL
  Filled 2022-08-15 (×2): qty 1

## 2022-08-15 MED ORDER — CIPROFLOXACIN HCL 500 MG PO TABS: 500.0000 mg | ORAL_TABLET | Freq: Every day | ORAL | Status: DC

## 2022-08-15 MED ORDER — HYDROMORPHONE HCL 1 MG/ML IJ SOLN
0.5000 mg | Freq: Four times a day (QID) | INTRAMUSCULAR | Status: DC | PRN
  Administered 2022-08-15 – 2022-08-16 (×4): 0.5 mg via INTRAVENOUS
  Filled 2022-08-15 (×4): qty 1

## 2022-08-15 MED ORDER — POLYETHYLENE GLYCOL 3350 17 G PO PACK
17.0000 g | PACK | Freq: Two times a day (BID) | ORAL | Status: DC
  Filled 2022-08-15 (×2): qty 1

## 2022-08-15 MED ORDER — AZITHROMYCIN 250 MG PO TABS
500.0000 mg | ORAL_TABLET | Freq: Every day | ORAL | Status: AC
  Administered 2022-08-16: 500 mg via ORAL
  Filled 2022-08-15: qty 2
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
## 2022-08-15 NOTE — Evaluation (Signed)
Physical Therapy Evaluation Patient Details Name: [name] MRN: [id number] DOB: [birth year] Today's Date: 08/15/2022  History of Present Illness  Pt is a 61 y.o. female who presented 08/11/22 with x5 day hx of abdominal pain, admitted for sepsis. CT shows possible early LUL PNA. S/p paracentesis L lower quadrant 11/28. PMHx:  asthma, CAD, chronic back and abdominal pain on opioids, cirrhosis due to NASH, COPD, depression, DM2, obesity, OSA, HTN, HLD, ascites.  Clinical Impression  Patient presents again close to functional baseline despite new R LE > L LE pain.  Currently S for mobility due to lines.  SpO2 97% on RA with ambulation.  She was able to negotiate steps appropriate for home entry without physical assist.  Spouse present initially and supportive.  No follow up PT needs identified either.  PT will sign off and encourage pt to mobilize with nursing/mobility specialist until d/c.       Recommendations for follow up therapy are one component of a multi-disciplinary discharge planning process, led by the attending physician.  Recommendations may be updated based on patient status, additional functional criteria and insurance authorization.  Follow Up Recommendations No PT follow up      Assistance Recommended at Discharge Set up Supervision/Assistance  Patient can return home with the following  Assistance with cooking/housework;Assist for transportation;Help with stairs or ramp for entrance    Equipment Recommendations Rollator (4 wheels)  Recommendations for Other Services       Functional Status Assessment Patient has not had a recent decline in their functional status     Precautions / Restrictions Precautions Precautions: Fall      Mobility  Bed Mobility Overal bed mobility: Modified Independent                  Transfers Overall transfer level: Needs assistance   Transfers: Sit to/from Stand, Bed to chair/wheelchair/BSC Sit to Stand: Supervision    Step pivot transfers: Supervision       General transfer comment: assist for lines; stepped to [facility] prior to ambulation    Ambulation/Gait Ambulation/Gait assistance: Min guard, Supervision Gait Distance (Feet): 180 Feet Assistive device: None Gait Pattern/deviations: Step-through pattern, Decreased stride length, Antalgic       General Gait Details: slow pace, stops at times to hold abdomen and to speak with GI MD in hallway  Stairs Stairs: Yes Stairs assistance: Supervision Stair Management: Two rails, Step to pattern, Forwards, Sideways, One rail Right Number of Stairs: 3 General stair comments: step to pattern forward to ascend with bilat rails, step to with R rail sideways technique  Wheelchair Mobility    Modified Rankin (Stroke Patients Only)       Balance                                             Pertinent Vitals/Pain Pain Assessment Faces Pain Scale: Hurts even more Pain Location: abdomen, bil legs (R>L) Pain Descriptors / Indicators: Aching, Sharp Pain Intervention(s): Monitored during session, Premedicated before session    Home Living Family/patient expects to be discharged to:: Private residence Living Arrangements: Spouse/significant other Available Help at Discharge: Family;Available PRN/intermittently Type of Home: House Home Access: Stairs to enter Entrance Stairs-Rails: Right;Left;Can reach both Entrance Stairs-Number of Steps: 3   Home Layout: One level Home Equipment: Conservation officer, nature (2 wheels);Kasandra Knudsen - single point      Prior Function Prior  Level of Function : Independent/Modified Independent             Mobility Comments: IND without AD majority of time, but when having pain she sometimes uses her cane, has a walker that is broken down she says ADLs Comments: some standby assist for showering. Patient no longer drives and husband assists with IADLs     Hand Dominance   Dominant Hand: Right     Extremity/Trunk Assessment   Upper Extremity Assessment Upper Extremity Assessment: Overall WFL for tasks assessed    Lower Extremity Assessment Lower Extremity Assessment: Overall WFL for tasks assessed    Cervical / Trunk Assessment Cervical / Trunk Exceptions: increased body habitus  Communication   Communication: No difficulties  Cognition Arousal/Alertness: Awake/alert Behavior During Therapy: WFL for tasks assessed/performed Overall Cognitive Status: Within Functional Limits for tasks assessed                                          General Comments      Exercises     Assessment/Plan    PT Assessment Patient does not need any further PT services  PT Problem List         PT Treatment Interventions      PT Goals (Current goals can be found in the Care Plan section)  Acute Rehab PT Goals PT Goal Formulation: All assessment and education complete, DC therapy    Frequency       Co-evaluation               AM-PAC PT "6 Clicks" Mobility  Outcome Measure Help needed turning from your back to your side while in a flat bed without using bedrails?: None Help needed moving from lying on your back to sitting on the side of a flat bed without using bedrails?: None Help needed moving to and from a bed to a chair (including a wheelchair)?: None Help needed standing up from a chair using your arms (e.g., wheelchair or bedside chair)?: None Help needed to walk in hospital room?: None Help needed climbing 3-5 steps with a railing? : A Little 6 Click Score: 23    End of Session Equipment Utilized During Treatment: Gait belt Activity Tolerance: Patient limited by pain Patient left: in bed   PT Visit Diagnosis: Difficulty in walking, not elsewhere classified (R26.2);Pain Pain - Right/Left: Right Pain - part of body: Leg    Time: 5859-2924 PT Time Calculation (min) (ACUTE ONLY): 33 min   Charges:   PT Evaluation $PT Re-evaluation: 1  Re-eval PT Treatments $Gait Training: 8-22 mins        Magda Kiel, PT Acute Rehabilitation Services Office:[phone] 08/15/2022   Reginia Naas 08/15/2022, 11:13 AM

## 2022-08-15 NOTE — Progress Notes (Signed)
Very poor vasculature. Veins are either to small to place PIV in, or dept to great to place PIV or midline. Frequent restarts due to infiltration/poor vasculature. Notified nurse. Fran Lowes, RN VAST

## 2022-08-15 NOTE — Care Management Important Message (Signed)
Important Message  Patient Details  Name: CHAMPAGNE PALETTA MRN: [id number] Date of Birth: 06/20/1961   Medicare Important Message Given:  Yes     Shelda Altes 08/15/2022, 9:11 AM

## 2022-08-15 NOTE — Progress Notes (Signed)
PHARMACIST - PHYSICIAN COMMUNICATION   CONCERNING: IV to Oral Route Change Policy  RECOMMENDATION: This patient is receiving Azithromycin by the intravenous route.  Based on criteria approved by the Pharmacy and Therapeutics Committee, the intravenous medication(s) is/are being converted to the equivalent oral dose form(s).   DESCRIPTION: These criteria include: The patient is eating (either orally or via tube) and/or has been taking other orally administered medications for a least 24 hours The patient has no evidence of active gastrointestinal bleeding or impaired GI absorption (gastrectomy, short bowel, patient on TNA or NPO).  If you have questions about this conversion, please contact the Pharmacy Department  []   [phone] )  [facility] []   [phone] )  [facility] [x]   [phone] )  [facility] []   [phone] )  [facility] []   [phone] )  [place], PharmD Clinical Pharmacist **Pharmacist phone directory can now be found on [email or web address] ([email or web address]).  Listed under [place].

## 2022-08-15 NOTE — Progress Notes (Signed)
[facility] Progress Note  SUBJECTIVE:   Interval history: [name] was seen and evaluated today.  She was walking with physical therapy.  She noted having some abdominal discomfort.  Denied nausea or vomiting.  Noted having had a good night sleep.  No chest pain or shortness of breath.  Had a dark brown bowel movement today.  Had IR paracentesis yesterday, removed 1 L fluid, PMN count 59.5.  Echocardiogram results reviewed.  AFP unremarkable.  Hepatitis a and B testing unremarkable, recommend outpatient vaccination.  Past Medical History:  Diagnosis Date   Abdominal pain 01/12/2019   Abdominal pain, generalized 07/10/2014   Acute on chronic respiratory failure with hypoxia ([name]) 10/18/2018   Anxiety    Arthritis    Ascites 05/01/2019   NALD   Asthma    CAD (coronary artery disease)    cath 2010 with 60-70% left Cx and normal LVF   Chronic low back pain with left-sided sciatica 10/15/2015   Chronic pain    Chronic prescription benzodiazepine use 05/01/2019   Chronic, continuous use of opioids 05/01/2019   Cirrhosis of liver not due to alcohol ([place]) 05/01/2019   Cirrhosis of liver: per CT 07/11/2014   Constipation by delayed colonic transit 12/09/2014   COPD (chronic obstructive pulmonary disease) ([name])    COPD exacerbation ([place]) 10/12/2016   Dehiscence of surgical wound left knee 03/16/2013   Depression    Depression with anxiety 01/11/2019   Deviated septum    Diabetes mellitus without complication ([name])    type 2   Generalized abdominal pain 12/09/2014   GERD (gastroesophageal reflux disease)    Headache(784.0)    migraines   Hepatic cirrhosis ([place]) 06/2014   HLD (hyperlipidemia) 01/11/2019   Hypercholesterolemia    Hypertension    Hypertension associated with diabetes ([place])    Ileus ([place]) 07/11/2014   Influenza A 10/2018   Lobar pneumonia ([place])    Microcytic anemia 01/11/2019   Migraine headache 12/09/2014   Morbid obesity ([place]) 03/09/2013   Obesity    OSA  (obstructive sleep apnea) 01/11/2019   Pars defect with spondylolisthesis 05/01/2019   Perforated bowel ([place])    PONV (postoperative nausea and vomiting)    Respiratory failure, acute-on-chronic ([place]) 12/11/2010   S/P left PF UKR 03/07/2013   Seizures ([place])    as a little girl 61 years old   Shortness of breath    Sleep apnea    dont wear bipap . lost weight   Spondylolisthesis, grade 2    Tobacco abuse    Type 2 diabetes mellitus with obesity ([place]) 01/11/2019   Past Surgical History:  Procedure Laterality Date   ABDOMINAL EXPLORATION SURGERY     primary repair of colon perforation from [facility]   ABDOMINAL HYSTERECTOMY     total   BACK SURGERY     lumbar   COLONOSCOPY WITH PROPOFOL N/A 10/04/2019   Procedure: COLONOSCOPY WITH PROPOFOL;  Surgeon: Wilford Corner, MD;  Location: [facility];  Service: Endoscopy;  Laterality: N/A;   ESOPHAGOGASTRODUODENOSCOPY (EGD) WITH PROPOFOL N/A 10/29/2016   Procedure: ESOPHAGOGASTRODUODENOSCOPY (EGD) WITH PROPOFOL;  Surgeon: Laurence Spates, MD;  Location: [facility];  Service: Endoscopy;  Laterality: N/A;   ESOPHAGOGASTRODUODENOSCOPY (EGD) WITH PROPOFOL N/A 10/04/2019   Procedure: ESOPHAGOGASTRODUODENOSCOPY (EGD) WITH PROPOFOL;  Surgeon: Wilford Corner, MD;  Location: [facility];  Service: Endoscopy;  Laterality: N/A;   flank ablation     IR PARACENTESIS  01/12/2019   IR PARACENTESIS  05/02/2019   IR PARACENTESIS  08/03/2019  IR PARACENTESIS  08/12/2022   IR PARACENTESIS  08/14/2022   IRRIGATION AND DEBRIDEMENT KNEE Left 03/14/2013   Procedure: IRRIGATION AND DEBRIDEMENT  and Closure of wound left KNEE;  Surgeon: Mauri Pole, MD;  Location: [facility];  Service: Orthopedics;  Laterality: Left;   PATELLA-FEMORAL ARTHROPLASTY Left 03/07/2013   Procedure: LEFT PATELLA-FEMORAL ARTHROPLASTY;  Surgeon: Mauri Pole, MD;  Location: [facility];  Service: Orthopedics;  Laterality: Left;   SAVORY DILATION N/A 10/29/2016   Procedure: SAVORY DILATION;  Surgeon:  Laurence Spates, MD;  Location: [facility];  Service: Endoscopy;  Laterality: N/A;   Current Facility-Administered Medications  Medication Dose Route Frequency Provider Last Rate Last Admin   0.9 %  sodium chloride infusion   Intravenous PRN Regalado, Belkys A, MD   Stopped at 08/15/22 0453   acetaminophen (TYLENOL) tablet 650 mg  650 mg Oral Q6H PRN Lenore Cordia, MD   650 mg at 08/14/22 2008   Or   acetaminophen (TYLENOL) suppository 650 mg  650 mg Rectal Q6H PRN Lenore Cordia, MD       albuterol (PROVENTIL) (2.5 MG/3ML) 0.083% nebulizer solution 2.5 mg  2.5 mg Nebulization Q6H PRN Lenore Cordia, MD       ALPRAZolam Duanne Moron) tablet 2 mg  2 mg Oral QHS Zada Finders R, MD   2 mg at 08/14/22 2116   [START ON 08/16/2022] azithromycin (ZITHROMAX) tablet 500 mg  500 mg Oral Daily Kris Mouton, [facility]       [START ON 08/16/2022] cefdinir (OMNICEF) capsule 300 mg  300 mg Oral Q12H Regalado, Belkys A, MD       [START ON 08/18/2022] ciprofloxacin (CIPRO) tablet 500 mg  500 mg Oral Q breakfast Regalado, Belkys A, MD       cyclobenzaprine (FLEXERIL) tablet 10 mg  10 mg Oral TID PRN Mansy, Jan A, MD   10 mg at 08/15/22 1042   enoxaparin (LOVENOX) injection 40 mg  40 mg Subcutaneous Q24H Zada Finders R, MD   40 mg at 08/14/22 2117   escitalopram (LEXAPRO) tablet 20 mg  20 mg Oral QPM Zada Finders R, MD   20 mg at 08/15/22 1607   furosemide (LASIX) tablet 20 mg  20 mg Oral Daily Regalado, Belkys A, MD   20 mg at 08/15/22 0819   HYDROmorphone (DILAUDID) injection 0.5 mg  0.5 mg Intravenous Q6H PRN Regalado, Belkys A, MD   0.5 mg at 08/15/22 1641   hydrOXYzine (ATARAX) tablet 25 mg  25 mg Oral Q8H PRN Zada Finders R, MD   25 mg at 08/15/22 1042   insulin aspart (novoLOG) injection 0-9 Units  0-9 Units Subcutaneous TID WC Zada Finders R, MD   1 Units at 08/15/22 1607   mometasone-formoterol (DULERA) 200-5 MCG/ACT inhaler 2 puff  2 puff Inhalation BID Lenore Cordia, MD   2 puff at 08/15/22 0840    ondansetron (ZOFRAN) tablet 4 mg  4 mg Oral Q6H PRN Lenore Cordia, MD       Or   ondansetron (ZOFRAN) injection 4 mg  4 mg Intravenous Q6H PRN Zada Finders R, MD       oxyCODONE (Oxy IR/ROXICODONE) immediate release tablet 15 mg  15 mg Oral Q4H PRN Regalado, Belkys A, MD   15 mg at 08/15/22 1643   pantoprazole (PROTONIX) EC tablet 40 mg  40 mg Oral BID Zada Finders R, MD   40 mg at 08/15/22 0818   polyethylene glycol (MIRALAX / GLYCOLAX)  packet 17 g  17 g Oral BID Regalado, Belkys A, MD       senna-docusate (Senokot-S) tablet 1 tablet  1 tablet Oral BID Patrecia Pour, MD   1 tablet at 08/15/22 0819   simethicone (MYLICON) chewable tablet 160 mg  160 mg Oral QID PRN Mansy, Jan A, MD   160 mg at 08/12/22 2205   simvastatin (ZOCOR) tablet 20 mg  20 mg Oral QHS Zada Finders R, MD   20 mg at 08/14/22 2117   sodium chloride flush (NS) 0.9 % injection 3 mL  3 mL Intravenous Q12H Zada Finders R, MD   3 mL at 08/15/22 1093   traZODone (DESYREL) tablet 50 mg  50 mg Oral QHS Zada Finders R, MD   50 mg at 08/14/22 2116   Allergies as of 08/11/2022 - Review Complete 08/11/2022  Allergen Reaction Noted   Spironolactone Nausea Only 02/16/2020   Gabapentin Other (See Comments) 08/15/2019   Sulfamethoxazole-trimethoprim Hives 08/15/2019   Amoxicillin Nausea And Vomiting and Other (See Comments) 11/09/2011   Clindamycin/lincomycin Nausea And Vomiting 11/09/2011   Doxycycline Nausea And Vomiting 11/09/2011   Treximet [sumatriptan-naproxen sodium] Nausea And Vomiting 11/09/2011   Bactrim Hives and Rash 11/09/2011   Review of Systems:  Review of Systems  Respiratory:  Negative for shortness of breath.   Cardiovascular:  Negative for chest pain.  Gastrointestinal:  Positive for abdominal pain. Negative for blood in stool, nausea and vomiting.    OBJECTIVE:   Temp:  [97.8 F (36.6 C)-98.5 F (36.9 C)] 98.5 F (36.9 C) (12/01 1601) Pulse Rate:  [91-96] 96 (12/01 1601) Resp:  [17-20] 18 (12/01  1601) BP: (123-144)/(53-84) 125/70 (12/01 1601) SpO2:  [96 %-99 %] 97 % (12/01 1601) Last BM Date : 08/15/22 Physical Exam Constitutional:      General: She is not in acute distress.    Appearance: She is not ill-appearing, toxic-appearing or diaphoretic.  Cardiovascular:     Rate and Rhythm: Normal rate and regular rhythm.  Pulmonary:     Effort: No respiratory distress.     Breath sounds: Normal breath sounds.  Abdominal:     General: Bowel sounds are normal. There is no distension.     Palpations: Abdomen is soft.     Tenderness: There is abdominal tenderness (diffuse).  Neurological:     Mental Status: She is alert.     Labs: Recent Labs    08/13/22 0350 08/14/22 0728 08/15/22 0916  WBC 14.3* 9.2 6.7  HGB 10.7* 11.3* 11.1*  HCT 35.7* 38.6 37.1  PLT 167 203 178   BMET Recent Labs    08/13/22 0350 08/14/22 0728 08/15/22 0916  NA 133* 139 137  K 3.5 4.3 4.2  CL 100 98 97*  CO2 27 30 32  GLUCOSE 137* 130* 227*  BUN 8 6* <5*  CREATININE 0.80 0.75 0.64  CALCIUM 7.8* 8.3* 7.8*   LFT Recent Labs    08/13/22 0350  PROT 7.5  ALBUMIN 2.6*  AST 24  ALT 16  ALKPHOS 83  BILITOT 0.6   PT/INR No results for input(s): "LABPROT", "INR" in the last 72 hours. Diagnostic imaging: VAS [place] LOWER EXTREMITY VENOUS (DVT)  Result Date: 08/15/2022  Lower Venous DVT Study Patient Name:  [name]  Date of Exam:   08/15/2022 Medical Rec #: [id number]     Accession #:    [id number] Date of Birth: 1961-05-06     Patient Gender: F Patient Age:   61 years  Exam Location:  [facility] Procedure:      VAS [place] LOWER EXTREMITY VENOUS (DVT) Referring Phys: Jerald Kief REGALADO --------------------------------------------------------------------------------  Indications: Pain.  Comparison Study: 02/21/20 prior Performing Technologist: Archie Patten RVS  Examination Guidelines: A complete evaluation includes B-mode imaging, spectral Doppler, color Doppler, and power Doppler as needed of all  accessible portions of each vessel. Bilateral testing is considered an integral part of a complete examination. Limited examinations for reoccurring indications may be performed as noted. The reflux portion of the exam is performed with the patient in reverse Trendelenburg.  +---------+---------------+---------+-----------+----------+--------------+ RIGHT    CompressibilityPhasicitySpontaneityPropertiesThrombus Aging +---------+---------------+---------+-----------+----------+--------------+ CFV      Full           Yes      Yes                                 +---------+---------------+---------+-----------+----------+--------------+ SFJ      Full                                                        +---------+---------------+---------+-----------+----------+--------------+ FV Prox  Full                                                        +---------+---------------+---------+-----------+----------+--------------+ FV Mid   Full                                                        +---------+---------------+---------+-----------+----------+--------------+ FV DistalFull                                                        +---------+---------------+---------+-----------+----------+--------------+ PFV      Full                                                        +---------+---------------+---------+-----------+----------+--------------+ POP      Full           Yes      Yes                                 +---------+---------------+---------+-----------+----------+--------------+ PTV      Full                                                        +---------+---------------+---------+-----------+----------+--------------+ PERO     Full                                                        +---------+---------------+---------+-----------+----------+--------------+   +----+---------------+---------+-----------+----------+--------------+  LEFTCompressibilityPhasicitySpontaneityPropertiesThrombus Aging +----+---------------+---------+-----------+----------+--------------+ CFV Full           Yes      Yes                                 +----+---------------+---------+-----------+----------+--------------+     Summary: RIGHT: - There is no evidence of deep vein thrombosis in the lower extremity.  - No cystic structure found in the popliteal fossa.  LEFT: - No evidence of common femoral vein obstruction.  *See table(s) above for measurements and observations.    Preliminary    IR Paracentesis  Result Date: 08/14/2022 INDICATION: Patient with history of cirrhosis and ascites status post diagnostic paracentesis on 08/04/2022, fluid analysis showed nucleated cells in neutrophil with no bacteria. Request for repeat diagnostic paracentesis. EXAM: ULTRASOUND GUIDED  PARACENTESIS MEDICATIONS: 10 mL 1% lidocaine COMPLICATIONS: None immediate. PROCEDURE: Informed written consent was obtained from the patient after a discussion of the risks, benefits and alternatives to treatment. A timeout was performed prior to the initiation of the procedure. Initial ultrasound scanning demonstrates a small amount of ascites within the right lateral abdominal quadrant. The right lateral abdomen was prepped and draped in the usual sterile fashion. 1% lidocaine was used for local anesthesia. Following this, a 19 gauge, 7-cm, Yueh catheter was introduced. An ultrasound image was saved for documentation purposes. The paracentesis was performed. The catheter was removed and a dressing was applied. The patient tolerated the procedure well without immediate post procedural complication. FINDINGS: A total of approximately 1L of hazy yellow fluid was removed. Samples were sent to the laboratory as requested by the clinical team. IMPRESSION: Successful ultrasound-guided paracentesis yielding 1 liters of peritoneal fluid. PLAN: Patient had required to paracentesis in  past 30 days, however, the second paracentesis with older beginning mainly for diagnostic purposes and patient only had 1 L of ascites. But re-evaluation by [facility] portal hypertension clinic has not been established. Read by: Durenda Guthrie, PA-C Electronically Signed   By: Jerilynn Mages.  Shick M.D.   On: 08/14/2022 15:34   ECHOCARDIOGRAM COMPLETE  Result Date: 08/14/2022    ECHOCARDIOGRAM REPORT   Patient Name:   [name] Date of Exam: 08/14/2022 Medical Rec #:  [id number]    Height:       62.0 in Accession #:    [id number]   Weight:       242.7 lb Date of Birth:  Feb 27, 1961    BSA:          2.076 m Patient Age:    88 years     BP:           125/61 mmHg Patient Gender: F            HR:           86 bpm. Exam Location:  Inpatient Procedure: 2D Echo, Cardiac Doppler, Color Doppler and Intracardiac            Opacification Agent Indications:    Dyspnea  History:        Patient has prior history of Echocardiogram examinations, most                 recent 05/31/2022. CAD, COPD; Risk Factors:Hypertension,                 Diabetes, Dyslipidemia and Sleep Apnea.  Sonographer:    Clayton Lefort RDCS (AE) Referring Phys: Elmarie Shiley  Sonographer Comments: Technically  difficult study due to poor echo windows, patient is obese and no subcostal window. Image acquisition challenging due to patient body habitus. IMPRESSIONS  1. Left ventricular ejection fraction, by estimation, is 60 to 65%. The left ventricle has normal function. The left ventricle has no regional wall motion abnormalities. Left ventricular diastolic parameters are consistent with Grade II diastolic dysfunction (pseudonormalization).  2. Right ventricular systolic function is normal. The right ventricular size is normal. Tricuspid regurgitation signal is inadequate for assessing PA pressure.  3. No evidence of mitral valve regurgitation.  4. The aortic valve was not well visualized. Aortic valve regurgitation is not visualized.  5. The inferior vena cava  is normal in size with greater than 50% respiratory variability, suggesting right atrial pressure of 3 mmHg. Comparison(s): No significant change from prior study. FINDINGS  Left Ventricle: Left ventricular ejection fraction, by estimation, is 60 to 65%. The left ventricle has normal function. The left ventricle has no regional wall motion abnormalities. Definity contrast agent was given IV to delineate the left ventricular  endocardial borders. The left ventricular internal cavity size was normal in size. There is no left ventricular hypertrophy. Left ventricular diastolic parameters are consistent with Grade II diastolic dysfunction (pseudonormalization). Right Ventricle: The right ventricular size is normal. Right ventricular systolic function is normal. Tricuspid regurgitation signal is inadequate for assessing PA pressure. Left Atrium: Left atrial size was normal in size. Right Atrium: Right atrial size was normal in size. Pericardium: There is no evidence of pericardial effusion. Mitral Valve: No evidence of mitral valve regurgitation. MV peak gradient, 8.2 mmHg. The mean mitral valve gradient is 3.0 mmHg. Tricuspid Valve: Tricuspid valve regurgitation is not demonstrated. Aortic Valve: The aortic valve was not well visualized. Aortic valve regurgitation is not visualized. Aortic valve mean gradient measures 6.0 mmHg. Aortic valve peak gradient measures 11.4 mmHg. Aortic valve area, by VTI measures 2.60 cm. Pulmonic Valve: Pulmonic valve regurgitation is not visualized. Aorta: The aortic root and ascending aorta are structurally normal, with no evidence of dilitation. Venous: The inferior vena cava is normal in size with greater than 50% respiratory variability, suggesting right atrial pressure of 3 mmHg. IAS/Shunts: The interatrial septum was not well visualized.  LEFT VENTRICLE PLAX 2D LVIDd:         5.00 cm   Diastology LVIDs:         3.20 cm   LV e' medial:    9.57 cm/s LV PW:         1.10 cm   LV E/e'  medial:  14.4 LV IVS:        1.10 cm   LV e' lateral:   8.49 cm/s LVOT diam:     1.90 cm   LV E/e' lateral: 16.3 LV SV:         84 LV SV Index:   40 LVOT Area:     2.84 cm  RIGHT VENTRICLE RV Basal diam:  3.20 cm RV S prime:     16.50 cm/s TAPSE (M-mode): 2.2 cm LEFT ATRIUM             Index        RIGHT ATRIUM           Index LA diam:        3.60 cm 1.73 cm/m   RA Area:     16.70 cm LA Vol (A2C):   55.4 ml 26.69 ml/m  RA Volume:   41.80 ml  20.14 ml/m LA Vol (A4C):  71.0 ml 34.21 ml/m LA Biplane Vol: 63.4 ml 30.55 ml/m  AORTIC VALVE AV Area (Vmax):    2.43 cm AV Area (Vmean):   2.42 cm AV Area (VTI):     2.60 cm AV Vmax:           169.00 cm/s AV Vmean:          117.000 cm/s AV VTI:            0.323 m AV Peak Grad:      11.4 mmHg AV Mean Grad:      6.0 mmHg LVOT Vmax:         145.00 cm/s LVOT Vmean:        99.800 cm/s LVOT VTI:          0.296 m LVOT/AV VTI ratio: 0.92  AORTA Ao Root diam: 2.50 cm Ao Asc diam:  3.10 cm MITRAL VALVE MV Area (PHT): 3.72 cm     SHUNTS MV Area VTI:   2.45 cm     Systemic VTI:  0.30 m MV Peak grad:  8.2 mmHg     Systemic Diam: 1.90 cm MV Mean grad:  3.0 mmHg MV Vmax:       1.43 m/s MV Vmean:      85.8 cm/s MV Decel Time: 204 msec MV E velocity: 138.00 cm/s MV A velocity: 77.30 cm/s MV E/A ratio:  1.79 Landscape architect signed by Phineas Inches Signature Date/Time: 08/14/2022/9:44:23 AM    Final     IMPRESSION: 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             -Decompensated by ascites, on torsemide prior to admission             -First episode spontaneous bacterial peritonitis, paracentesis 08/12/22 with SAAG 1.2, total protein 4.1, PMN 1,316, repeat paracentesis 08/15/2022 with PMN count 59.5             -Last EGD/COL completed 10/04/2019, no esophageal varices             -No history hepatic encephalopathy             -No enhancing liver lesion on delayed contrast imaging 08/11/22             -AFP unremarkable  -Nonimmune to hepatitis a and  B Chronic abdominal pain  Community acquired pneumonia Chronic obstructive pulmonary disease Diabetes mellitus  PLAN: -Continue IV antibiotic therapy for total 5-7 days, repeat paracentesis fluid encouraging -Recommend secondary SBP prophylaxis with ciprofloxacin 500 mg p.o. daily indefinitely -Recommend bowel regimen with senna and MiraLAX, can increase MiraLAX dosing to symptom control -Recommend outpatient follow-up with gastroenterology, Dr. Michail Sermon -Diuretic therapy per internal medicine team -[facility] for discharge from GI standpoint -Gastroenterology team will respectfully sign off and be available for any questions that arise   LOS: 4 days   Danton Clap, DO [facility]

## 2022-08-15 NOTE — Progress Notes (Signed)
Continues to endorse high level pain, now mostly in legs compared to abdomen. Leg pain has been increasing over last couple days; unsure origin. No changes in leg assessment. Frequently back and forth from the bed, stating standing helps with the pain a little. Asked for additional pain medicine. RN paged and gave one time dose of dilaudid per new order. Plan to continue giving PRN pain meds and monitor patient.

## 2022-08-15 NOTE — Progress Notes (Signed)
Dear Doctor: This patient has been identified as a candidate for PICC or EJ for the following reason (s): IV therapy over 48 hours, poor veins/poor circulatory system (CHF, COPD, emphysema, diabetes, steroid use, IV drug abuse, etc.), and restarts due to phlebitis and infiltration in 24 hours If you agree, please write an order for the indicated device.   Thank you for supporting the early vascular access assessment program.

## 2022-08-15 NOTE — Progress Notes (Signed)
Mobility Specialist - Progress Note   08/15/22 1348  Mobility  Activity Ambulated with assistance in hallway  Level of Assistance Standby assist, set-up cues, supervision of patient - no hands on  Assistive Device None  Distance Ambulated (ft) 200 ft  Activity Response Tolerated well  Mobility Referral Yes  $Mobility charge 1 Mobility   Pt received standing at bedside and agreeable to mobility. No complaints throughout ambulation. Pt was returned to room with all needs met.   Franki Monte  Mobility Specialist Please contact via Solicitor or Rehab office at [phone]

## 2022-08-15 NOTE — Progress Notes (Signed)
Lower extremity venous duplex has been completed.   Preliminary results in CV Proc.   Jinny Blossom Jadie Comas 08/15/2022 3:54 PM

## 2022-08-16 DIAGNOSIS — A419 Sepsis, unspecified organism: Secondary | ICD-10-CM | POA: Diagnosis not present

## 2022-08-16 LAB — CULTURE, BLOOD (ROUTINE X 2)
Culture: NO GROWTH
Culture: NO GROWTH
Special Requests: ADEQUATE
Special Requests: ADEQUATE

## 2022-08-16 LAB — GLUCOSE, CAPILLARY
Glucose-Capillary: 161 mg/dL — ABNORMAL HIGH (ref 70–99)
Glucose-Capillary: 266 mg/dL — ABNORMAL HIGH (ref 70–99)

## 2022-08-16 MED ORDER — POLYETHYLENE GLYCOL 3350 17 G PO PACK: 17.0000 g | PACK | Freq: Two times a day (BID) | ORAL | 0 refills | Status: DC

## 2022-08-16 MED ORDER — SENNOSIDES-DOCUSATE SODIUM 8.6-50 MG PO TABS: 1.0000 | ORAL_TABLET | Freq: Two times a day (BID) | ORAL | 0 refills | Status: DC

## 2022-08-16 MED ORDER — CIPROFLOXACIN HCL 500 MG PO TABS: 500.0000 mg | ORAL_TABLET | Freq: Every day | ORAL | 0 refills | Status: DC

## 2022-08-16 MED ORDER — CEFDINIR 300 MG PO CAPS: 300.0000 mg | ORAL_CAPSULE | Freq: Two times a day (BID) | ORAL | 0 refills | Status: AC

## 2022-08-16 MED ORDER — FUROSEMIDE 20 MG PO TABS: 20.0000 mg | ORAL_TABLET | Freq: Every day | ORAL | 0 refills | Status: DC

## 2022-08-16 NOTE — Discharge Summary (Signed)
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 bowel normal. The colon and rectosigmoid colon are normal. Moderate volume stool throughout the colon. Vascular/Lymphatic: Abdominal aorta is normal caliber with atherosclerotic calcification. There is no retroperitoneal or periportal lymphadenopathy. No pelvic lymphadenopathy. Reproductive: Post hysterectomy.  Adnexa unremarkable Other: No free fluid. Musculoskeletal: No aggressive osseous lesion. IMPRESSION: Chest Impression: 1. Tree-in-bud pattern in the superior segment of the LEFT upper lobe suggest mild pulmonary infection. Recommend follow-up CT in 6 to 8 weeks to demonstrate resolution. 2. No mediastinal adenopathy. 3. Coronary artery calcification and Aortic Atherosclerosis (ICD10-I70.0). Abdomen / Pelvis Impression: 1. Moderate volume free fluid in the abdomen pelvis similar to comparison  CT. Favor ascites related to cirrhosis. 2. Moderate volume stool suggest mild constipation. 3. Nodule liver consistent with cirrhosis. Electronically Signed   By: Suzy Bouchard M.D.   On: 08/11/2022 14:10   DG Chest Port 1 View  Result Date: 08/11/2022  CLINICAL DATA:  Abdominal pain.  Question sepsis. EXAM: PORTABLE CHEST 1 VIEW COMPARISON:  05/30/2022 FINDINGS: The heart size and mediastinal contours are within normal limits. Both lungs are clear. The visualized skeletal structures are unremarkable. IMPRESSION: No active disease. Electronically Signed   By: Nelson Chimes M.D.   On: 08/11/2022 09:01    Microbiology: Results for orders placed or performed during the hospital encounter of 08/11/22  Resp Panel by RT-PCR (Flu A&B, Covid) Anterior Nasal Swab     Status: None   Collection Time: 08/11/22  8:00 AM   Specimen: Anterior Nasal Swab  Result Value Ref Range Status   SARS Coronavirus 2 by RT PCR NEGATIVE NEGATIVE Final    Comment: (NOTE) SARS-CoV-2 target nucleic acids are NOT DETECTED.  The SARS-CoV-2 RNA is generally detectable in upper respiratory specimens during the acute phase of infection. The lowest concentration of SARS-CoV-2 viral copies this assay can detect is 138 copies/mL. A negative result does not preclude SARS-Cov-2 infection and should not be used as the sole basis for treatment or other patient management decisions. A negative result may occur with  improper specimen collection/handling, submission of specimen other than nasopharyngeal swab, presence of viral mutation(s) within the areas targeted by this assay, and inadequate number of viral copies(<138 copies/mL). A negative result must be combined with clinical observations, patient history, and epidemiological information. The expected result is Negative.  Fact Sheet for Patients:  EntrepreneurPulse.com.au  Fact Sheet for Healthcare Providers:   IncredibleEmployment.be  This test is no t yet approved or cleared by the [place] FDA and  has been authorized for detection and/or diagnosis of SARS-CoV-2 by FDA under an Emergency Use Authorization (EUA). This EUA will remain  in effect (meaning this test can be used) for the duration of the COVID-19 declaration under Section 564(b)(1) of the Act, 21 U.S.C.section 360bbb-3(b)(1), unless the authorization is terminated  or revoked sooner.       Influenza A by PCR NEGATIVE NEGATIVE Final   Influenza B by PCR NEGATIVE NEGATIVE Final    Comment: (NOTE) The Xpert Xpress SARS-CoV-2/FLU/RSV plus assay is intended as an aid in the diagnosis of influenza from Nasopharyngeal swab specimens and should not be used as a sole basis for treatment. Nasal washings and aspirates are unacceptable for Xpert Xpress SARS-CoV-2/FLU/RSV testing.  Fact Sheet for Patients: EntrepreneurPulse.com.au  Fact Sheet for Healthcare Providers: IncredibleEmployment.be  This test is not yet approved or cleared by the [place] FDA and has been authorized for detection and/or diagnosis of SARS-CoV-2 by FDA under an Emergency Use Authorization (EUA). This EUA will remain in effect (meaning this test can be used) for the duration of the COVID-19 declaration under Section 564(b)(1) of the Act, 21 U.S.C. section 360bbb-3(b)(1), unless the authorization is terminated or revoked.  Performed at [facility], [address] [place], [place] [address]   Blood Culture (routine x 2)     Status: None   Collection Time: 08/11/22  8:27 AM   Specimen: BLOOD  Result Value Ref Range Status   Specimen Description   Final    BLOOD RIGHT ANTECUBITAL Performed at [facility], [address] [place], [place] [address]    Special Requests   Final    Blood Culture adequate volume BOTTLES DRAWN AEROBIC AND  ANAEROBIC Performed at [facility], [address], [place], [place] [address]    Culture   Final    NO GROWTH 5 DAYS Performed at [facility],  [address], [place], [place] [address]    Report Status 08/16/2022 FINAL  Final  Blood Culture (routine x 2)     Status: None   Collection Time: 08/11/22  9:20 AM   Specimen: BLOOD  Result Value Ref Range Status   Specimen Description   Final    BLOOD BLOOD RIGHT ARM Performed at [facility], [address] [place], [place] [address]    Special Requests   Final    Blood Culture adequate volume BOTTLES DRAWN AEROBIC AND ANAEROBIC Performed at [facility], [address] [place], [place] [address]    Culture   Final    NO GROWTH 5 DAYS Performed at [facility], [place] [address], [place], [place] [address]    Report Status 08/16/2022 FINAL  Final  Urine Culture     Status: None   Collection Time: 08/11/22 11:56 AM   Specimen: In/Out Cath Urine  Result Value Ref Range Status   Specimen Description   Final    IN/OUT CATH URINE Performed at [facility], [address], [place], [place] [address]    Special Requests   Final    NONE Performed at [facility], [address] [place], [place] [address]    Culture   Final    NO GROWTH Performed at [facility], [place] [address], [place], [place] [address]    Report Status 08/12/2022 FINAL  Final  Culture, body fluid w Gram Stain-bottle     Status: None (Preliminary result)   Collection Time: 08/12/22 11:03 AM   Specimen: Peritoneal Washings  Result Value Ref Range Status   Specimen Description PERITONEAL  Final   Special Requests NONE  Final   Culture   Final    NO GROWTH 4 DAYS Performed at [facility] [address], [place], [place] [address]    Report Status PENDING  Incomplete  Culture, body fluid w Gram Stain-bottle     Status: None (Preliminary  result)   Collection Time: 08/14/22  1:18 PM   Specimen: Fluid  Result Value Ref Range Status   Specimen Description FLUID PERITONEAL ABDOMEN  Final   Special Requests BOTTLES DRAWN AEROBIC AND ANAEROBIC  Final   Culture   Final    NO GROWTH 2 DAYS Performed at [facility], [place] [address], [place], [place] [address]    Report Status PENDING  Incomplete  Gram stain     Status: None   Collection Time: 08/14/22  1:18 PM   Specimen: Fluid  Result Value Ref Range Status   Specimen Description FLUID PERITONEAL ABDOMEN  Final   Special Requests NONE  Final   Gram Stain   Final    WBC PRESENT,BOTH PMN AND MONONUCLEAR NO ORGANISMS SEEN CYTOSPIN SMEAR Performed at [facility], [address], [place],  [address]    Report Status 08/14/2022 FINAL  Final    Labs: CBC: Recent Labs  Lab 08/11/22 0803 08/12/22 0246 08/13/22 0350 08/14/22 0728 08/15/22 0916  WBC 14.2* 16.5* 14.3* 9.2 6.7  NEUTROABS 12.2*  --  10.4*  --   --   HGB 10.7* 9.6* 10.7* 11.3* 11.1*  HCT 34.9* 31.3* 35.7* 38.6 37.1  MCV 79.7* 80.5 81.7 82.7 82.6  PLT 188 168 167 203 001   Basic Metabolic Panel: Recent Labs  Lab 08/11/22 0803 08/12/22 0246 08/13/22 0350 08/14/22 0728 08/15/22 0916  NA 133* 135 133* 139 137  K  4.6 3.9 3.5 4.3 4.2  CL 96* 97* 100 98 97*  CO2 _0 32  GLUCOSE 161* 125* 137* 130* 227*  BUN 8 7* 8 6* <5*  CREATININE 0.79 0.79 0.80 0.75 0.64  CALCIUM 8.9 8.1* 7.8* 8.3* 7.8*  MG  --   --   --  1.8  --    Liver Function Tests: Recent Labs  Lab 08/11/22 0803 08/12/22 0246 08/13/22 0350  AST _1 ALT _2 ALKPHOS 88 77 83  BILITOT 0.7 1.3* 0.6  PROT 8.3* 6.8 7.5  ALBUMIN 3.6 2.6* 2.6*   CBG: Recent Labs  Lab 08/15/22 1130 08/15/22 1557 08/15/22 2158 08/16/22 0804 08/16/22 1227  GLUCAP 207* 124* 282* 161* 266*    Discharge time spent: greater than 30 minutes.  Signed: Elmarie Shiley, MD [facility] 08/16/2022

## 2022-08-16 NOTE — Progress Notes (Signed)
Refused to wear supplemental oxygen while asleep. Sp02 = >95% on room air. Plan of care ongoing.

## 2022-08-16 NOTE — Plan of Care (Signed)
  Problem: Education: Goal: Knowledge of General Education information will improve Description: Including pain rating scale, medication(s)/side effects and non-pharmacologic comfort measures Outcome: Progressing   Problem: Health Behavior/Discharge Planning: Goal: Ability to manage health-related needs will improve Outcome: Progressing  Problem: Coping: Goal: Level of anxiety will decrease Outcome: Progressing   Problem: Pain Managment: Goal: General experience of comfort will improve Outcome: Progressing   Problem: Safety: Goal: Ability to remain free from injury will improve Outcome: Progressing   Problem: Clinical Measurements: Goal: Cardiovascular complication will be avoided Outcome: Progressing

## 2022-08-16 NOTE — Care Management (Signed)
  Transition of Care [facility]) Screening Note   Patient Details  Name: [name] Date of Birth: [birth year]   Transition of Care [facility]) CM/SW Contact:    Bethena Roys, RN Phone Number: 08/16/2022, 1:33 PM    Transition of Care Department [facility]) has reviewed the patient. Patient planned for discharge home later this evening. Patient has used [employer] in the past for DME needs. Patient is in need of a rollator and is okay with Case Manager submitted the referral to [employer]. No further needs identified at this time.

## 2022-08-17 LAB — CULTURE, BODY FLUID W GRAM STAIN -BOTTLE: Culture: NO GROWTH

## 2022-08-17 LAB — PATHOLOGIST SMEAR REVIEW

## 2022-08-19 LAB — CULTURE, BODY FLUID W GRAM STAIN -BOTTLE: Culture: NO GROWTH

## 2022-08-21 DIAGNOSIS — J449 Chronic obstructive pulmonary disease, unspecified: Secondary | ICD-10-CM | POA: Diagnosis not present

## 2022-08-21 DIAGNOSIS — K7460 Unspecified cirrhosis of liver: Secondary | ICD-10-CM | POA: Diagnosis not present

## 2022-08-21 DIAGNOSIS — I10 Essential (primary) hypertension: Secondary | ICD-10-CM | POA: Diagnosis not present

## 2022-09-05 DIAGNOSIS — K7460 Unspecified cirrhosis of liver: Secondary | ICD-10-CM | POA: Diagnosis not present

## 2022-09-05 DIAGNOSIS — G8929 Other chronic pain: Secondary | ICD-10-CM | POA: Diagnosis not present

## 2022-09-05 DIAGNOSIS — R109 Unspecified abdominal pain: Secondary | ICD-10-CM | POA: Diagnosis not present

## 2022-09-05 DIAGNOSIS — M542 Cervicalgia: Secondary | ICD-10-CM | POA: Diagnosis not present

## 2022-09-05 DIAGNOSIS — Z79899 Other long term (current) drug therapy: Secondary | ICD-10-CM | POA: Diagnosis not present

## 2022-09-05 DIAGNOSIS — M545 Low back pain, unspecified: Secondary | ICD-10-CM | POA: Diagnosis not present

## 2022-09-15 DIAGNOSIS — J449 Chronic obstructive pulmonary disease, unspecified: Secondary | ICD-10-CM | POA: Diagnosis not present

## 2022-10-03 DIAGNOSIS — R109 Unspecified abdominal pain: Secondary | ICD-10-CM | POA: Diagnosis not present

## 2022-10-03 DIAGNOSIS — M542 Cervicalgia: Secondary | ICD-10-CM | POA: Diagnosis not present

## 2022-10-03 DIAGNOSIS — Z79899 Other long term (current) drug therapy: Secondary | ICD-10-CM | POA: Diagnosis not present

## 2022-10-03 DIAGNOSIS — M545 Low back pain, unspecified: Secondary | ICD-10-CM | POA: Diagnosis not present

## 2022-10-03 DIAGNOSIS — G8929 Other chronic pain: Secondary | ICD-10-CM | POA: Diagnosis not present

## 2022-10-03 DIAGNOSIS — K7460 Unspecified cirrhosis of liver: Secondary | ICD-10-CM | POA: Diagnosis not present

## 2022-10-17 ENCOUNTER — Other Ambulatory Visit: Payer: Self-pay | Admitting: Gastroenterology

## 2022-10-17 DIAGNOSIS — K7460 Unspecified cirrhosis of liver: Secondary | ICD-10-CM | POA: Diagnosis not present

## 2022-10-17 DIAGNOSIS — R188 Other ascites: Secondary | ICD-10-CM | POA: Diagnosis not present

## 2022-10-17 DIAGNOSIS — R1033 Periumbilical pain: Secondary | ICD-10-CM | POA: Diagnosis not present

## 2022-10-18 DIAGNOSIS — J449 Chronic obstructive pulmonary disease, unspecified: Secondary | ICD-10-CM | POA: Diagnosis not present

## 2022-10-27 DIAGNOSIS — J3489 Other specified disorders of nose and nasal sinuses: Secondary | ICD-10-CM | POA: Diagnosis not present

## 2022-10-27 DIAGNOSIS — L98491 Non-pressure chronic ulcer of skin of other sites limited to breakdown of skin: Secondary | ICD-10-CM | POA: Diagnosis not present

## 2022-10-31 DIAGNOSIS — Z79899 Other long term (current) drug therapy: Secondary | ICD-10-CM | POA: Diagnosis not present

## 2022-10-31 DIAGNOSIS — K7460 Unspecified cirrhosis of liver: Secondary | ICD-10-CM | POA: Diagnosis not present

## 2022-10-31 DIAGNOSIS — M545 Low back pain, unspecified: Secondary | ICD-10-CM | POA: Diagnosis not present

## 2022-10-31 DIAGNOSIS — R109 Unspecified abdominal pain: Secondary | ICD-10-CM | POA: Diagnosis not present

## 2022-10-31 DIAGNOSIS — G8929 Other chronic pain: Secondary | ICD-10-CM | POA: Diagnosis not present

## 2022-10-31 DIAGNOSIS — M542 Cervicalgia: Secondary | ICD-10-CM | POA: Diagnosis not present

## 2022-11-05 ENCOUNTER — Observation Stay (HOSPITAL_BASED_OUTPATIENT_CLINIC_OR_DEPARTMENT_OTHER)
Admission: EM | Admit: 2022-11-05 | Discharge: 2022-11-06 | Disposition: A | Discharge status: Home / Self Care | Payer: 59 | Attending: Internal Medicine | Admitting: Internal Medicine

## 2022-11-05 ENCOUNTER — Emergency Department (HOSPITAL_BASED_OUTPATIENT_CLINIC_OR_DEPARTMENT_OTHER): Payer: 59

## 2022-11-05 ENCOUNTER — Encounter (HOSPITAL_BASED_OUTPATIENT_CLINIC_OR_DEPARTMENT_OTHER): Payer: Self-pay | Admitting: Emergency Medicine

## 2022-11-05 ENCOUNTER — Other Ambulatory Visit: Payer: Self-pay

## 2022-11-05 DIAGNOSIS — Z7952 Long term (current) use of systemic steroids: Secondary | ICD-10-CM | POA: Insufficient documentation

## 2022-11-05 DIAGNOSIS — D649 Anemia, unspecified: Secondary | ICD-10-CM | POA: Diagnosis not present

## 2022-11-05 DIAGNOSIS — K760 Fatty (change of) liver, not elsewhere classified: Secondary | ICD-10-CM | POA: Diagnosis present

## 2022-11-05 DIAGNOSIS — R0902 Hypoxemia: Secondary | ICD-10-CM | POA: Diagnosis present

## 2022-11-05 DIAGNOSIS — Z1152 Encounter for screening for COVID-19: Secondary | ICD-10-CM | POA: Diagnosis not present

## 2022-11-05 DIAGNOSIS — E1169 Type 2 diabetes mellitus with other specified complication: Secondary | ICD-10-CM | POA: Diagnosis present

## 2022-11-05 DIAGNOSIS — R188 Other ascites: Secondary | ICD-10-CM | POA: Diagnosis not present

## 2022-11-05 DIAGNOSIS — K7460 Unspecified cirrhosis of liver: Secondary | ICD-10-CM | POA: Diagnosis not present

## 2022-11-05 DIAGNOSIS — Z79899 Other long term (current) drug therapy: Secondary | ICD-10-CM | POA: Diagnosis not present

## 2022-11-05 DIAGNOSIS — E871 Hypo-osmolality and hyponatremia: Secondary | ICD-10-CM | POA: Insufficient documentation

## 2022-11-05 DIAGNOSIS — I10 Essential (primary) hypertension: Secondary | ICD-10-CM | POA: Insufficient documentation

## 2022-11-05 DIAGNOSIS — R0602 Shortness of breath: Secondary | ICD-10-CM | POA: Insufficient documentation

## 2022-11-05 DIAGNOSIS — F17210 Nicotine dependence, cigarettes, uncomplicated: Secondary | ICD-10-CM | POA: Insufficient documentation

## 2022-11-05 DIAGNOSIS — E119 Type 2 diabetes mellitus without complications: Secondary | ICD-10-CM | POA: Diagnosis not present

## 2022-11-05 DIAGNOSIS — I2510 Atherosclerotic heart disease of native coronary artery without angina pectoris: Secondary | ICD-10-CM | POA: Diagnosis not present

## 2022-11-05 DIAGNOSIS — J45909 Unspecified asthma, uncomplicated: Secondary | ICD-10-CM | POA: Diagnosis not present

## 2022-11-05 DIAGNOSIS — J449 Chronic obstructive pulmonary disease, unspecified: Secondary | ICD-10-CM | POA: Insufficient documentation

## 2022-11-05 DIAGNOSIS — E876 Hypokalemia: Secondary | ICD-10-CM | POA: Diagnosis not present

## 2022-11-05 DIAGNOSIS — E785 Hyperlipidemia, unspecified: Secondary | ICD-10-CM | POA: Diagnosis present

## 2022-11-05 DIAGNOSIS — E1159 Type 2 diabetes mellitus with other circulatory complications: Secondary | ICD-10-CM | POA: Diagnosis present

## 2022-11-05 DIAGNOSIS — J9622 Acute and chronic respiratory failure with hypercapnia: Secondary | ICD-10-CM | POA: Diagnosis not present

## 2022-11-05 DIAGNOSIS — J9621 Acute and chronic respiratory failure with hypoxia: Principal | ICD-10-CM | POA: Insufficient documentation

## 2022-11-05 DIAGNOSIS — G8929 Other chronic pain: Secondary | ICD-10-CM | POA: Diagnosis present

## 2022-11-05 DIAGNOSIS — G4733 Obstructive sleep apnea (adult) (pediatric): Secondary | ICD-10-CM | POA: Diagnosis present

## 2022-11-05 DIAGNOSIS — K7581 Nonalcoholic steatohepatitis (NASH): Secondary | ICD-10-CM | POA: Diagnosis not present

## 2022-11-05 DIAGNOSIS — M5442 Lumbago with sciatica, left side: Secondary | ICD-10-CM | POA: Diagnosis not present

## 2022-11-05 DIAGNOSIS — J9611 Chronic respiratory failure with hypoxia: Secondary | ICD-10-CM | POA: Diagnosis present

## 2022-11-05 DIAGNOSIS — R9431 Abnormal electrocardiogram [ECG] [EKG]: Secondary | ICD-10-CM | POA: Insufficient documentation

## 2022-11-05 DIAGNOSIS — J441 Chronic obstructive pulmonary disease with (acute) exacerbation: Secondary | ICD-10-CM | POA: Diagnosis not present

## 2022-11-05 DIAGNOSIS — E669 Obesity, unspecified: Secondary | ICD-10-CM | POA: Diagnosis present

## 2022-11-05 DIAGNOSIS — Z720 Tobacco use: Secondary | ICD-10-CM | POA: Diagnosis present

## 2022-11-05 DIAGNOSIS — R7989 Other specified abnormal findings of blood chemistry: Secondary | ICD-10-CM | POA: Diagnosis not present

## 2022-11-05 DIAGNOSIS — K7031 Alcoholic cirrhosis of liver with ascites: Secondary | ICD-10-CM | POA: Diagnosis not present

## 2022-11-05 LAB — RESP PANEL BY RT-PCR (RSV, FLU A&B, COVID)  RVPGX2
Influenza A by PCR: NEGATIVE
Influenza B by PCR: NEGATIVE
Resp Syncytial Virus by PCR: NEGATIVE
SARS Coronavirus 2 by RT PCR: NEGATIVE

## 2022-11-05 LAB — BASIC METABOLIC PANEL
Anion gap: 12 (ref 5–15)
BUN: 11 mg/dL (ref 8–23)
CO2: 32 mmol/L (ref 22–32)
Calcium: 8.4 mg/dL — ABNORMAL LOW (ref 8.9–10.3)
Chloride: 86 mmol/L — ABNORMAL LOW (ref 98–111)
Creatinine, Ser: 1.07 mg/dL — ABNORMAL HIGH (ref 0.44–1.00)
GFR, Estimated: 58 mL/min — ABNORMAL LOW (ref >60)
Glucose, Bld: 118 mg/dL — ABNORMAL HIGH (ref 70–99)
Potassium: 4.2 mmol/L (ref 3.5–5.1)
Sodium: 130 mmol/L — ABNORMAL LOW (ref 135–145)

## 2022-11-05 LAB — I-STAT ARTERIAL BLOOD GAS, ED
Acid-Base Excess: 17 mmol/L — ABNORMAL HIGH (ref 0.0–2.0)
Bicarbonate: 45.2 mmol/L — ABNORMAL HIGH (ref 20.0–28.0)
Calcium, Ion: 1.04 mmol/L — ABNORMAL LOW (ref 1.15–1.40)
HCT: 32 % — ABNORMAL LOW (ref 36.0–46.0)
Hemoglobin: 10.9 g/dL — ABNORMAL LOW (ref 12.0–15.0)
O2 Saturation: 85 %
Patient temperature: 98.6
Potassium: 3.4 mmol/L — ABNORMAL LOW (ref 3.5–5.1)
Sodium: 133 mmol/L — ABNORMAL LOW (ref 135–145)
TCO2: 48 mmol/L — ABNORMAL HIGH (ref 22–32)
pCO2 arterial: 79.3 mmHg (ref 32–48)
pH, Arterial: 7.364 (ref 7.35–7.45)
pO2, Arterial: 55 mmHg — ABNORMAL LOW (ref 83–108)

## 2022-11-05 LAB — CBC WITH DIFFERENTIAL/PLATELET
Abs Immature Granulocytes: 0.02 10*3/uL (ref 0.00–0.07)
Basophils Absolute: 0 10*3/uL (ref 0.0–0.1)
Basophils Relative: 0 %
Eosinophils Absolute: 0.1 10*3/uL (ref 0.0–0.5)
Eosinophils Relative: 2 %
HCT: 33.6 % — ABNORMAL LOW (ref 36.0–46.0)
Hemoglobin: 10.1 g/dL — ABNORMAL LOW (ref 12.0–15.0)
Immature Granulocytes: 0 %
Lymphocytes Relative: 23 %
Lymphs Abs: 1.6 10*3/uL (ref 0.7–4.0)
MCH: 24.1 pg — ABNORMAL LOW (ref 26.0–34.0)
MCHC: 30.1 g/dL (ref 30.0–36.0)
MCV: 80.2 fL (ref 80.0–100.0)
Monocytes Absolute: 0.5 10*3/uL (ref 0.1–1.0)
Monocytes Relative: 7 %
Neutro Abs: 4.8 10*3/uL (ref 1.7–7.7)
Neutrophils Relative %: 68 %
Platelets: 203 10*3/uL (ref 150–400)
RBC: 4.19 MIL/uL (ref 3.87–5.11)
RDW: 16.7 % — ABNORMAL HIGH (ref 11.5–15.5)
WBC: 7.1 10*3/uL (ref 4.0–10.5)
nRBC: 0 % (ref 0.0–0.2)

## 2022-11-05 LAB — TROPONIN I (HIGH SENSITIVITY)
Troponin I (High Sensitivity): 84 ng/L — ABNORMAL HIGH (ref <18)
Troponin I (High Sensitivity): 142 ng/L (ref <18)

## 2022-11-05 LAB — BRAIN NATRIURETIC PEPTIDE: B Natriuretic Peptide: 30.1 pg/mL (ref 0.0–100.0)

## 2022-11-05 MED ORDER — OXYCODONE HCL 5 MG PO TABS
15.0000 mg | ORAL_TABLET | ORAL | Status: DC | PRN
  Administered 2022-11-05 – 2022-11-06 (×5): 15 mg via ORAL
  Filled 2022-11-05 (×5): qty 3

## 2022-11-05 MED ORDER — ASPIRIN 81 MG PO CHEW
324.0000 mg | CHEWABLE_TABLET | Freq: Once | ORAL | Status: AC
  Administered 2022-11-05: 324 mg via ORAL
  Filled 2022-11-05: qty 4

## 2022-11-05 MED ORDER — PROCHLORPERAZINE EDISYLATE 10 MG/2ML IJ SOLN: 5.0000 mg | Freq: Four times a day (QID) | INTRAMUSCULAR | Status: DC | PRN

## 2022-11-05 MED ORDER — OXYCODONE HCL 5 MG PO TABS
15.0000 mg | ORAL_TABLET | Freq: Three times a day (TID) | ORAL | Status: DC | PRN
  Administered 2022-11-05: 15 mg via ORAL
  Filled 2022-11-05: qty 3

## 2022-11-05 MED ORDER — MAGNESIUM SULFATE IN D5W 1-5 GM/100ML-% IV SOLN
1.0000 g | Freq: Once | INTRAVENOUS | Status: AC
  Administered 2022-11-05: 1 g via INTRAVENOUS
  Filled 2022-11-05: qty 100

## 2022-11-05 MED ORDER — FUROSEMIDE 20 MG PO TABS: 20.0000 mg | ORAL_TABLET | Freq: Every day | ORAL | Status: DC

## 2022-11-05 MED ORDER — IPRATROPIUM-ALBUTEROL 0.5-2.5 (3) MG/3ML IN SOLN
3.0000 mL | Freq: Four times a day (QID) | RESPIRATORY_TRACT | Status: DC
  Administered 2022-11-05 – 2022-11-06 (×3): 3 mL via RESPIRATORY_TRACT
  Filled 2022-11-05 (×3): qty 3

## 2022-11-05 MED ORDER — BUDESONIDE 0.5 MG/2ML IN SUSP
1.0000 mg | Freq: Every day | RESPIRATORY_TRACT | Status: DC
  Administered 2022-11-05 – 2022-11-06 (×2): 1 mg via RESPIRATORY_TRACT
  Filled 2022-11-05 (×2): qty 4

## 2022-11-05 MED ORDER — POTASSIUM CHLORIDE CRYS ER 20 MEQ PO TBCR
40.0000 meq | EXTENDED_RELEASE_TABLET | Freq: Once | ORAL | Status: AC
  Administered 2022-11-05: 40 meq via ORAL
  Filled 2022-11-05: qty 2

## 2022-11-05 MED ORDER — TORSEMIDE 20 MG PO TABS: 20.0000 mg | ORAL_TABLET | Freq: Every day | ORAL | Status: DC

## 2022-11-05 MED ORDER — HEPARIN SODIUM (PORCINE) 5000 UNIT/ML IJ SOLN
5000.0000 [IU] | Freq: Three times a day (TID) | INTRAMUSCULAR | Status: DC
  Administered 2022-11-05 – 2022-11-06 (×2): 5000 [IU] via SUBCUTANEOUS
  Filled 2022-11-05 (×2): qty 1

## 2022-11-05 MED ORDER — TORSEMIDE 20 MG PO TABS
20.0000 mg | ORAL_TABLET | Freq: Every day | ORAL | Status: DC
  Administered 2022-11-06: 40 mg via ORAL
  Filled 2022-11-05: qty 2

## 2022-11-05 MED ORDER — TOPIRAMATE 25 MG PO TABS
50.0000 mg | ORAL_TABLET | Freq: Every day | ORAL | Status: DC
  Administered 2022-11-05 – 2022-11-06 (×2): 50 mg via ORAL
  Filled 2022-11-05 (×2): qty 2

## 2022-11-05 MED ORDER — BENAZEPRIL HCL 20 MG PO TABS
40.0000 mg | ORAL_TABLET | Freq: Every day | ORAL | Status: DC
  Administered 2022-11-05 – 2022-11-06 (×2): 40 mg via ORAL
  Filled 2022-11-05 (×2): qty 2

## 2022-11-05 MED ORDER — SIMVASTATIN 20 MG PO TABS
20.0000 mg | ORAL_TABLET | Freq: Every day | ORAL | Status: DC
  Administered 2022-11-05: 20 mg via ORAL
  Filled 2022-11-05: qty 1

## 2022-11-05 MED ORDER — ALBUTEROL SULFATE (2.5 MG/3ML) 0.083% IN NEBU: 2.5000 mg | INHALATION_SOLUTION | RESPIRATORY_TRACT | Status: DC | PRN

## 2022-11-05 MED ORDER — ACETAMINOPHEN 325 MG PO TABS
650.0000 mg | ORAL_TABLET | Freq: Once | ORAL | Status: AC
  Administered 2022-11-05: 650 mg via ORAL
  Filled 2022-11-05: qty 2

## 2022-11-05 MED ORDER — ALPRAZOLAM 0.5 MG PO TABS
1.0000 mg | ORAL_TABLET | Freq: Every day | ORAL | Status: DC
  Administered 2022-11-06: 1 mg via ORAL
  Filled 2022-11-05: qty 2

## 2022-11-05 MED ORDER — NALOXONE HCL 4 MG/0.1ML NA LIQD: 1.0000 | NASAL | Status: DC | PRN

## 2022-11-05 MED ORDER — ONDANSETRON HCL 4 MG/2ML IJ SOLN: 4.0000 mg | Freq: Once | INTRAMUSCULAR | Status: DC

## 2022-11-05 MED ORDER — TRAZODONE HCL 50 MG PO TABS: 50.0000 mg | ORAL_TABLET | Freq: Every evening | ORAL | Status: DC | PRN

## 2022-11-05 MED ORDER — FLUTICASONE PROPIONATE 50 MCG/ACT NA SUSP: 1.0000 | Freq: Every day | NASAL | Status: DC | PRN

## 2022-11-05 MED ORDER — CLONIDINE HCL 0.2 MG PO TABS
0.2000 mg | ORAL_TABLET | Freq: Two times a day (BID) | ORAL | Status: DC
  Administered 2022-11-05 – 2022-11-06 (×2): 0.2 mg via ORAL
  Filled 2022-11-05 (×2): qty 1

## 2022-11-05 MED ORDER — INSULIN ASPART 100 UNIT/ML IJ SOLN
0.0000 [IU] | Freq: Three times a day (TID) | INTRAMUSCULAR | Status: DC
  Administered 2022-11-06: 2 [IU] via SUBCUTANEOUS

## 2022-11-05 MED ORDER — ALPRAZOLAM 0.5 MG PO TABS: 0.5000 mg | ORAL_TABLET | Freq: Three times a day (TID) | ORAL | Status: DC | PRN

## 2022-11-05 MED ORDER — CIPROFLOXACIN HCL 500 MG PO TABS
500.0000 mg | ORAL_TABLET | Freq: Every day | ORAL | Status: DC
  Administered 2022-11-06: 500 mg via ORAL
  Filled 2022-11-05 (×2): qty 1

## 2022-11-05 MED ORDER — OXYCODONE HCL 5 MG PO TABS: 15.0000 mg | ORAL_TABLET | Freq: Four times a day (QID) | ORAL | Status: DC | PRN

## 2022-11-05 MED ORDER — ESCITALOPRAM OXALATE 10 MG PO TABS
20.0000 mg | ORAL_TABLET | Freq: Every evening | ORAL | Status: DC
  Filled 2022-11-05: qty 2

## 2022-11-05 MED ORDER — LACTATED RINGERS IV SOLN: INTRAVENOUS | Status: DC

## 2022-11-05 MED ORDER — ALPRAZOLAM 0.5 MG PO TABS
2.0000 mg | ORAL_TABLET | Freq: Every day | ORAL | Status: DC
  Administered 2022-11-05: 2 mg via ORAL
  Filled 2022-11-05: qty 4

## 2022-11-05 MED ORDER — FUROSEMIDE 10 MG/ML IJ SOLN: 40.0000 mg | Freq: Once | INTRAMUSCULAR | Status: DC

## 2022-11-05 MED ORDER — ACETAMINOPHEN 325 MG PO TABS: 325.0000 mg | ORAL_TABLET | Freq: Four times a day (QID) | ORAL | Status: DC | PRN

## 2022-11-05 MED ORDER — PANTOPRAZOLE SODIUM 40 MG PO TBEC
40.0000 mg | DELAYED_RELEASE_TABLET | Freq: Two times a day (BID) | ORAL | Status: DC
  Administered 2022-11-05 – 2022-11-06 (×2): 40 mg via ORAL
  Filled 2022-11-05 (×2): qty 1

## 2022-11-05 MED ORDER — AMLODIPINE BESYLATE 5 MG PO TABS
5.0000 mg | ORAL_TABLET | Freq: Every day | ORAL | Status: DC
  Administered 2022-11-05 – 2022-11-06 (×2): 5 mg via ORAL
  Filled 2022-11-05 (×2): qty 1

## 2022-11-05 MED ORDER — INSULIN ASPART 100 UNIT/ML IJ SOLN: 0.0000 [IU] | Freq: Every day | INTRAMUSCULAR | Status: DC

## 2022-11-05 MED ORDER — SODIUM BICARBONATE 8.4 % IV SOLN
50.0000 meq | Freq: Once | INTRAVENOUS | Status: AC
  Administered 2022-11-05: 50 meq via INTRAVENOUS
  Filled 2022-11-05: qty 50

## 2022-11-05 MED ORDER — LACTULOSE 10 GM/15ML PO SOLN: 10.0000 g | Freq: Two times a day (BID) | ORAL | Status: DC | PRN

## 2022-11-05 MED ORDER — FUROSEMIDE 10 MG/ML IJ SOLN
60.0000 mg | Freq: Once | INTRAMUSCULAR | Status: AC
  Administered 2022-11-05: 60 mg via INTRAVENOUS
  Filled 2022-11-05: qty 6

## 2022-11-05 MED ORDER — CYCLOBENZAPRINE HCL 10 MG PO TABS: 10.0000 mg | ORAL_TABLET | Freq: Three times a day (TID) | ORAL | Status: DC | PRN
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
## 2022-11-05 NOTE — ED Triage Notes (Signed)
Pt in with sob, reports home oxygen 68% when she checked it this morning. States she is supposed to wear home O2, but does not right now, since she cannot afford it. Sats 78% on RA when pt roomed. 2LNC applied, and sats >94% now.

## 2022-11-05 NOTE — ED Notes (Signed)
Pt reports HA, tylenol per protocol.

## 2022-11-05 NOTE — Progress Notes (Signed)
Change alprazolam to home dose per Dr. Waldron Labs.  Onnie Boer, PharmD, BCIDP, AAHIVP, CPP Infectious Disease Pharmacist 11/05/2022 6:22 PM

## 2022-11-05 NOTE — ED Notes (Signed)
Date and time results received: 11/05/22 9:43 AM  (use smartphrase ".now" to insert current time)  Test: Trop Critical Value: 142  Name of Provider Notified: Dr. Regenia Skeeter  Orders Received? Or Actions Taken?: see chart

## 2022-11-05 NOTE — ED Notes (Signed)
Pt sitting up in bed, pt nodding off, pt reports 8/10 headache, resps even and unlabored. Pt on cardiac and O2 sat monitor.

## 2022-11-05 NOTE — ED Notes (Signed)
Pt in bed with eyes closed, resps even and unlabored, pt satting 88-91% on 2L, repositioned pt in bed and O2 sat improved to 90-92%.  Upon arousal pt reports 6/10 headache.

## 2022-11-05 NOTE — ED Provider Notes (Addendum)
[facility] Provider Note   CSN: [id number] Arrival date & time: 11/05/22  [id number]     History  Chief Complaint  Patient presents with   Hypoxia    [name] is a 62 y.o. female.  The history is provided by the patient.  Illness Location:  At home Quality:  Low oxygen saturation Severity:  Moderate Onset quality:  Gradual Timing:  Constant Progression:  Unchanged Chronicity:  Recurrent Context:  Patient with COPD who is supposed to be on home oxygen but is not using secondary to mean and continuing to vape presents with low oxygen and vomiting at home Relieved by:  Nothing Worsened by:  Nothing Ineffective treatments:  None Associated symptoms: nausea and vomiting   Associated symptoms: no chest pain, no fever and no rhinorrhea   Patient with COPD who is supposed to be on 2L home O2 but is not presents with low sat and vomiting at home.  She states that she vapes.  Patient also reports that she vomited yeaterday and felt she was going to vomit in her sleep.      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   Cirrhosis of liver: per CT 07/11/2014   Constipation by delayed colonic transit 12/09/2014   COPD (chronic obstructive pulmonary disease) ([name])    COPD exacerbation ([place]) 10/12/2016   Dehiscence of surgical wound left knee 03/16/2013   Depression    Depression with anxiety 01/11/2019   Deviated septum    Diabetes mellitus  without complication ([name])    type 2   Generalized abdominal pain 12/09/2014   GERD (gastroesophageal reflux disease)    Headache(784.0)    migraines   Hepatic cirrhosis ([place]) 06/2014   HLD (hyperlipidemia) 01/11/2019   Hypercholesterolemia    Hypertension    Hypertension associated with diabetes ([place])    Ileus ([place]) 07/11/2014   Influenza A 10/2018   Lobar pneumonia ([place])    Microcytic anemia 01/11/2019   Migraine headache 12/09/2014   Morbid obesity ([place]) 03/09/2013   Obesity    OSA (obstructive sleep apnea) 01/11/2019   Pars defect with spondylolisthesis 05/01/2019   Perforated bowel ([place])    PONV (postoperative nausea and vomiting)    Respiratory failure, acute-on-chronic ([place]) 12/11/2010   S/P left PF UKR 03/07/2013   Seizures ([place])    as a little girl 62 years old   Shortness of breath    Sleep apnea    dont wear bipap . lost weight   Spondylolisthesis, grade 2    Tobacco abuse    Type 2 diabetes mellitus with obesity ([place]) 01/11/2019    Home Medications Prior to Admission medications   Medication Sig Start Date End Date Taking? Authorizing Provider  albuterol (PROVENTIL HFA;VENTOLIN HFA) 108 (90 BASE) MCG/ACT inhaler Inhale 2 puffs into the lungs every 6 (six) hours as needed for wheezing or shortness of breath.  [provider]  albuterol (PROVENTIL) (2.5 MG/3ML) 0.083% nebulizer solution Take 2.5 mg by nebulization every 6 (six) hours as needed for wheezing or shortness of breath.    [provider]  ALPRAZolam Duanne Moron) 1 MG tablet Take by mouth See admin instructions. Take one tablet (1 mg) by mouth in the morning and two tablets (2 mg) in the evening.    [provider]  augmented betamethasone dipropionate (DIPROLENE-AF) 0.05 % cream Apply 1 Application topically daily.    [provider]  Benzocaine-Resorcinol (VAGISIL EX) Apply 1 application topically daily.    [provider]  budesonide (PULMICORT) 1 MG/2ML nebulizer  solution Take 1 mg by nebulization daily.    [provider]  ciprofloxacin (CIPRO) 500 MG tablet Take 1 tablet (500 mg total) by mouth daily with breakfast. 08/18/22   Regalado, Belkys A, MD  cloNIDine (CATAPRES) 0.2 MG tablet Take 0.2 mg by mouth 2 (two) times daily. 02/05/21   [provider]  cyclobenzaprine (FLEXERIL) 10 MG tablet Take 10 mg by mouth 3 (three) times daily as needed for muscle spasms.    [provider]  diclofenac Sodium (VOLTAREN) 1 % GEL Apply 1 Application topically 4 (four) times daily.    [provider]  docusate sodium (COLACE) 250 MG capsule Take 250 mg by mouth daily as needed for constipation.    [provider]  escitalopram (LEXAPRO) 20 MG tablet Take 20 mg by mouth every evening.  04/06/17   [provider]  estradiol (ESTRACE) 2 MG tablet Take 2 mg by mouth daily.     [provider]  fluticasone (FLONASE) 50 MCG/ACT nasal spray Place 1 spray into both nostrils daily as needed for allergies.    [provider]  furosemide (LASIX) 20 MG tablet Take 1 tablet (20 mg total) by mouth daily. 08/16/22   Regalado, Belkys A, MD  Glycerin-Polysorbate 80 (REFRESH DRY EYE THERAPY OP) Place 1 drop into both eyes daily as needed (dry eyes).    [provider]  hydrOXYzine (ATARAX/VISTARIL) 25 MG tablet Take 25 mg by mouth every 8 (eight) hours as needed for anxiety.    [provider]  ipratropium-albuterol (DUONEB) 0.5-2.5 (3) MG/3ML SOLN Take 3 mLs by nebulization every 6 (six) hours as needed (shortness of breath).    [provider]  levocetirizine (XYZAL) 5 MG tablet Take 5 mg by mouth every evening.  09/16/19   [provider]  lidocaine (XYLOCAINE) 5 % ointment Apply 1 application topically 2 (two) times daily as needed (hemorrhoids).    [provider]  meloxicam (MOBIC) 15 MG tablet Take 15 mg by mouth daily as needed for pain.  Patient not taking: Reported on  08/12/2022 09/16/19   [provider]  mupirocin cream (BACTROBAN) 2 % Apply 1 Application topically 2 (two) times daily.    [provider]  naloxone [facility]) 4 MG/0.1ML LIQD nasal spray kit Place 1 spray into the nose as needed (for overdose).     [provider]  nystatin (MYCOSTATIN/NYSTOP) powder Apply 1 g topically daily as needed (irritation. (Typically during Summer months)).     [provider]  ondansetron (ZOFRAN) 8 MG tablet Take 8 mg by mouth every 8 (eight) hours as needed for nausea or vomiting.    [provider]  oxyCODONE (ROXICODONE) 15 MG immediate release tablet Take 1 tablet (15 mg total) by mouth every 8 (eight) hours as needed for pain. Patient taking differently: Take 15 mg  by mouth every 4 (four) hours. 03/29/21   [name], MD  pantoprazole (PROTONIX) 40 MG tablet Take 40 mg by mouth 2 (two) times daily.     [provider]  PHAZYME MAXIMUM STRENGTH 250 MG CAPS Take 250 mg by mouth 2 (two) times daily as needed (gas relief).    [provider]  polyethylene glycol (MIRALAX / GLYCOLAX) 17 g packet Take 17 g by mouth 2 (two) times daily. 08/16/22   Regalado, Belkys A, MD  prochlorperazine (COMPAZINE) 10 MG tablet Take 10 mg by mouth every 8 (eight) hours as needed for nausea or vomiting.    [provider]  rizatriptan (MAXALT-MLT) 10 MG disintegrating tablet Take 10 mg by mouth as needed for migraine. May repeat in 2 hours if needed Rizatriptan benzoate 14m tablet disintegrating    [provider]  SEMAGLUTIDE-WEIGHT MANAGEMENT [place] Inject into the skin. Ozempic    [provider]  senna-docusate (SENOKOT-S) 8.6-50 MG tablet Take 1 tablet by mouth 2 (two) times daily. 08/16/22   Regalado, Belkys A, MD  simvastatin (ZOCOR) 20 MG tablet Take 20 mg by mouth at bedtime.    [provider]  SYMBICORT 160-4.5 MCG/ACT inhaler Inhale 2 puffs into the lungs in the morning and at  bedtime.  02/09/20   [provider]  topiramate (TOPAMAX) 50 MG tablet Take 50 mg by mouth daily.    [provider]  traZODone (DESYREL) 50 MG tablet Take 50 mg by mouth at bedtime as needed for sleep. 01/15/21   [provider]  triamcinolone cream (KENALOG) 0.1 % Apply 1 application topically 2 (two) times daily as needed (rash).    [provider]      Allergies    Spironolactone, Gabapentin, Sulfamethoxazole-trimethoprim, Amoxicillin, Clindamycin/lincomycin, Doxycycline, Treximet [sumatriptan-naproxen sodium], and Bactrim    Review of Systems   Review of Systems  Constitutional:  Negative for fever.  HENT:  Negative for rhinorrhea.   Eyes:  Negative for photophobia.  Cardiovascular:  Negative for chest pain.  Gastrointestinal:  Positive for nausea and vomiting.  All other systems reviewed and are negative.   Physical Exam Updated Vital Signs There were no vitals taken for this visit. Physical Exam Vitals and nursing note reviewed. Exam conducted with a chaperone present.  Constitutional:      General: She is not in acute distress.    Appearance: Normal appearance. She is well-developed.  HENT:     Head: Normocephalic and atraumatic.     Nose: Nose normal.  Eyes:     Pupils: Pupils are equal, round, and reactive to light.  Cardiovascular:     Rate and Rhythm: Normal rate and regular rhythm.     Pulses: Normal pulses.     Heart sounds: Normal heart sounds.  Pulmonary:     Effort: Pulmonary effort is normal. No respiratory distress.     Breath sounds: Normal breath sounds. No stridor. No wheezing.  Abdominal:     General: Bowel sounds are normal. There is no distension.     Palpations: Abdomen is soft.     Tenderness: There is no abdominal tenderness. There is no guarding or rebound.  Genitourinary:    Vagina: No vaginal discharge.  Musculoskeletal:        General: Normal range of motion.     Cervical back: Neck supple.  Skin:     General: Skin is warm and dry.     Capillary Refill: Capillary refill takes less than 2 seconds.  Findings: No erythema or rash.  Neurological:     General: No focal deficit present.     Mental Status: She is alert and oriented to person, place, and time.     Deep Tendon Reflexes: Reflexes normal.  Psychiatric:        Mood and Affect: Mood normal.     ED Results / Procedures / Treatments   Labs (all labs ordered are listed, but only abnormal results are displayed) Results for orders placed or performed during the hospital encounter of 11/05/22  CBC with Differential  Result Value Ref Range   WBC 7.1 4.0 - 10.5 K/uL   RBC 4.19 3.87 - 5.11 MIL/uL   Hemoglobin 10.1 (L) 12.0 - 15.0 g/dL   HCT 33.6 (L) 36.0 - 46.0 %   MCV 80.2 80.0 - 100.0 fL   MCH 24.1 (L) 26.0 - 34.0 pg   MCHC 30.1 30.0 - 36.0 g/dL   RDW 16.7 (H) 11.5 - 15.5 %   Platelets 203 150 - 400 K/uL   nRBC 0.0 0.0 - 0.2 %   Neutrophils Relative % 68 %   Neutro Abs 4.8 1.7 - 7.7 K/uL   Lymphocytes Relative 23 %   Lymphs Abs 1.6 0.7 - 4.0 K/uL   Monocytes Relative 7 %   Monocytes Absolute 0.5 0.1 - 1.0 K/uL   Eosinophils Relative 2 %   Eosinophils Absolute 0.1 0.0 - 0.5 K/uL   Basophils Relative 0 %   Basophils Absolute 0.0 0.0 - 0.1 K/uL   Immature Granulocytes 0 %   Abs Immature Granulocytes 0.02 0.00 - 0.07 K/uL   No results found.  EKG  EKG Interpretation  Date/Time:  Wednesday November 05 2022 06:22:28 EST Ventricular Rate:  96 PR Interval:  106 QRS Duration: 97 QT Interval:  426 QTC Calculation: 539 R Axis:   89 Text Interpretation: Sinus rhythm Short PR interval Prolonged QT interval Confirmed by Dory Horn) on 11/05/2022 6:27:10 AM         Radiology No results found.  Procedures Procedures    Medications Ordered in ED   ED Course/ Medical Decision Making/ A&P                             Medical Decision Making Patient with COPD who is supposed to be on O2 but is not  secondary to means presents with hypoxia and vomiting at home   Amount and/or Complexity of Data Reviewed External Data Reviewed: labs and notes.    Details: Previous notes and labs reviewed  Labs: ordered.    Details: White count 7.1 normal, anemia 10.1, normal platelet count 203K.  Hypercapnia 79.3  Radiology: ordered and independent interpretation performed.    Details: No PNA by me on CXR ECG/medicine tests: ordered and independent interpretation performed. Decision-making details documented in ED Course.  Risk Prescription drug management. Risk Details: Long Qt noted on EKG, this was repeated and is consistent.  I have made a note in the chart that the patient is not to have QT prolonging medications.  Will need to review whether there are electrolytes that can be corrected to improve this.  Labs are pending at this time     Final Clinical Impression(s) / ED Diagnoses Final diagnoses:  None   Signed out to Dr. Regenia Skeeter pending labs     [place], Naim Murtha, MD 11/05/22 [phone]

## 2022-11-05 NOTE — ED Provider Notes (Addendum)
Care transferred to me.  Patient is maintaining her O2 sats on 2 L but is still only 91%.  She did feel like she had some wheezing last night and there may be a mild acute component but it seems like the vomiting led her to check her oxygen which is low and it seems like she is supposed to chronically be on oxygen.  Either way, has a mild AKI and a troponin leak which I suspect is from hypoxia rather than ACS.  She did have a brief episode of chest pain under her breasts but this seemed positional last night.  No current chest pain.  Will give aspirin and trend troponin.  Will need admission. Discussed with Dr. Tamala Julian.  Of note she has cirrhosis and ascites but does not have any current abdominal pain and my suspicion that she has SBP is pretty low at this time.   Sherwood Gambler, MD 11/05/22 0806  11:00 AM Of note, her Troponin went to 142. However with no chest pain or ischemia on EKG, seems unlikley this is from ACS. I think this is probably from hypoxia. I did discuss case with Dr. [place] of cardiology, he feels this is likely hypoxia and no further treatment or workup is needed and do not need to keep checking troponins.  Will continue admission to hospitalist service.   Sherwood Gambler, MD 11/05/22 1101

## 2022-11-05 NOTE — ED Notes (Signed)
ED TO INPATIENT HANDOFF REPORT  ED Nurse Name and Phone #: Dorian Pod Stacie Knutzen  S Name/Age/Gender [name] 62 y.o. female Room/Bed: DB014/DB014  Code Status   Code Status: Prior  Home/SNF/Other Home Patient oriented to: self, place, time, and situation Is this baseline? Yes   Triage Complete: Triage complete  Chief Complaint Acute on chronic respiratory failure with hypoxia and hypercapnia ([place]) [J96.21, J96.22]  Triage Note Pt in with sob, reports home oxygen 68% when she checked it this morning. States she is supposed to wear home O2, but does not right now, since she cannot afford it. Sats 78% on RA when pt roomed. 2LNC applied, and sats >94% now.   Allergies Allergies  Allergen Reactions   Spironolactone Nausea Only    nausea   Gabapentin Other (See Comments)    sleepwalking   Sulfamethoxazole-Trimethoprim Hives    Hives all over body   Amoxicillin Nausea And Vomiting and Other (See Comments)    tolerated augmentin in the past (03/2013) without issue. Did it involve swelling of the face/tongue/throat, SOB, or low BP? No Did it involve sudden or severe rash/hives, skin peeling, or any reaction on the inside of your mouth or nose? No Did you need to seek medical attention at a hospital or doctor's office? No When did it last happen? unk       If all above answers are "NO", may proceed with cephalosporin use.    Clindamycin/Lincomycin Nausea And Vomiting   Doxycycline Nausea And Vomiting   Treximet [Sumatriptan-Naproxen Sodium] Nausea And Vomiting   Trimethoprim Hives   Bactrim Hives and Rash    Level of Care/Admitting Diagnosis ED Disposition     ED Disposition  Admit   Condition  --   Comment  Hospital Area: [facility] [[id number]]  Level of Care: Telemetry Medical [104]  Interfacility transfer: Yes  May place patient in observation at [facility] or [facility] if equivalent level of care is available:: No  Covid Evaluation: Asymptomatic  - no recent exposure (last 10 days) testing not required  Diagnosis: Acute on chronic respiratory failure with hypoxia and hypercapnia [facility]) [id number]  Admitting Physician: Norval [place] [id number]  Attending Physician: Norval [place] [id number]          B Medical/Surgery History Past Medical History:  Diagnosis Date   Abdominal pain 01/12/2019   Abdominal pain, generalized 07/10/2014   Acute on chronic respiratory failure with hypoxia ([place]) 10/18/2018   Anxiety    Arthritis    Ascites 05/01/2019   NALD   Asthma    CAD (coronary artery disease)    cath 2010 with 60-70% left Cx and normal LVF   Chronic low back pain with left-sided sciatica 10/15/2015   Chronic pain    Chronic prescription benzodiazepine use 05/01/2019   Chronic, continuous use of opioids 05/01/2019   Cirrhosis of liver not due to alcohol ([place]) 05/01/2019   Cirrhosis of liver: per CT 07/11/2014   Constipation by delayed colonic transit 12/09/2014   COPD (chronic obstructive pulmonary disease) ([name])    COPD exacerbation ([place]) 10/12/2016   Dehiscence of surgical wound left knee 03/16/2013   Depression    Depression with anxiety 01/11/2019   Deviated septum    Diabetes mellitus without complication ([place])    type 2   Generalized abdominal pain 12/09/2014   GERD (gastroesophageal reflux disease)    Headache(784.0)    migraines   Hepatic cirrhosis ([place]) 06/2014   HLD (hyperlipidemia) 01/11/2019  Hypercholesterolemia    Hypertension    Hypertension associated with diabetes ([place])    Ileus ([place]) 07/11/2014   Influenza A 10/2018   Lobar pneumonia ([place])    Microcytic anemia 01/11/2019   Migraine headache 12/09/2014   Morbid obesity ([place]) 03/09/2013   Obesity    OSA (obstructive sleep apnea) 01/11/2019   Pars defect with spondylolisthesis 05/01/2019   Perforated bowel ([place])    PONV (postoperative nausea and vomiting)    Respiratory failure, acute-on-chronic ([place]) 12/11/2010   S/P left PF UKR 03/07/2013   Seizures ([place])     as a little girl 62 years old   Shortness of breath    Sleep apnea    dont wear bipap . lost weight   Spondylolisthesis, grade 2    Tobacco abuse    Type 2 diabetes mellitus with obesity ([place]) 01/11/2019   Past Surgical History:  Procedure Laterality Date   ABDOMINAL EXPLORATION SURGERY     primary repair of colon perforation from [facility]   ABDOMINAL HYSTERECTOMY     total   BACK SURGERY     lumbar   COLONOSCOPY WITH PROPOFOL N/A 10/04/2019   Procedure: COLONOSCOPY WITH PROPOFOL;  Surgeon: Wilford Corner, MD;  Location: [facility] ENDOSCOPY;  Service: Endoscopy;  Laterality: N/A;   ESOPHAGOGASTRODUODENOSCOPY (EGD) WITH PROPOFOL N/A 10/29/2016   Procedure: ESOPHAGOGASTRODUODENOSCOPY (EGD) WITH PROPOFOL;  Surgeon: Laurence Spates, MD;  Location: [facility];  Service: Endoscopy;  Laterality: N/A;   ESOPHAGOGASTRODUODENOSCOPY (EGD) WITH PROPOFOL N/A 10/04/2019   Procedure: ESOPHAGOGASTRODUODENOSCOPY (EGD) WITH PROPOFOL;  Surgeon: Wilford Corner, MD;  Location: [facility];  Service: Endoscopy;  Laterality: N/A;   flank ablation     IR PARACENTESIS  01/12/2019   IR PARACENTESIS  05/02/2019   IR PARACENTESIS  08/03/2019   IR PARACENTESIS  08/12/2022   IR PARACENTESIS  08/14/2022   IRRIGATION AND DEBRIDEMENT KNEE Left 03/14/2013   Procedure: IRRIGATION AND DEBRIDEMENT  and Closure of wound left KNEE;  Surgeon: Mauri Pole, MD;  Location: [facility] ORS;  Service: Orthopedics;  Laterality: Left;   PATELLA-FEMORAL ARTHROPLASTY Left 03/07/2013   Procedure: LEFT PATELLA-FEMORAL ARTHROPLASTY;  Surgeon: Mauri Pole, MD;  Location: [facility];  Service: Orthopedics;  Laterality: Left;   SAVORY DILATION N/A 10/29/2016   Procedure: SAVORY DILATION;  Surgeon: Laurence Spates, MD;  Location: [facility];  Service: Endoscopy;  Laterality: N/A;     A IV Location/Drains/Wounds Patient Lines/Drains/Airways Status     Active Line/Drains/Airways     Name Placement date Placement time Site Days   Peripheral IV  11/05/22 20 G Left Antecubital 11/05/22  0625  Antecubital  less than 1   Wound / Incision (Open or Dehisced) 10/13/16 Other (Comment) Arm Right skin tear 10/13/16  2100  Arm  2214            Intake/Output Last 24 hours No intake or output data in the 24 hours ending 11/05/22 1539  Labs/Imaging Results for orders placed or performed during the hospital encounter of 11/05/22 (from the past 48 hour(s))  CBC with Differential     Status: Abnormal   Collection Time: 11/05/22  6:30 AM  Result Value Ref Range   WBC 7.1 4.0 - 10.5 K/uL   RBC 4.19 3.87 - 5.11 MIL/uL   Hemoglobin 10.1 (L) 12.0 - 15.0 g/dL   HCT 33.6 (L) 36.0 - 46.0 %   MCV 80.2 80.0 - 100.0 fL   MCH 24.1 (L) 26.0 - 34.0 pg   MCHC 30.1 30.0 - 36.0  g/dL   RDW 16.7 (H) 11.5 - 15.5 %   Platelets 203 150 - 400 K/uL   nRBC 0.0 0.0 - 0.2 %   Neutrophils Relative % 68 %   Neutro Abs 4.8 1.7 - 7.7 K/uL   Lymphocytes Relative 23 %   Lymphs Abs 1.6 0.7 - 4.0 K/uL   Monocytes Relative 7 %   Monocytes Absolute 0.5 0.1 - 1.0 K/uL   Eosinophils Relative 2 %   Eosinophils Absolute 0.1 0.0 - 0.5 K/uL   Basophils Relative 0 %   Basophils Absolute 0.0 0.0 - 0.1 K/uL   Immature Granulocytes 0 %   Abs Immature Granulocytes 0.02 0.00 - 0.07 K/uL    Comment: Performed at [facility], [address], [place], [place] [id number]  Basic metabolic panel     Status: Abnormal   Collection Time: 11/05/22  6:30 AM  Result Value Ref Range   Sodium 130 (L) 135 - 145 mmol/L   Potassium 4.2 3.5 - 5.1 mmol/L   Chloride 86 (L) 98 - 111 mmol/L   CO2 32 22 - 32 mmol/L   Glucose, Bld 118 (H) 70 - 99 mg/dL    Comment: Glucose reference range applies only to samples taken after fasting for at least 8 hours.   BUN 11 8 - 23 mg/dL   Creatinine, Ser 1.07 (H) 0.44 - 1.00 mg/dL   Calcium 8.4 (L) 8.9 - 10.3 mg/dL   GFR, Estimated 58 (L) >60 mL/min    Comment: (NOTE) Calculated using the CKD-EPI Creatinine Equation (2021)    Anion  gap 12 5 - 15    Comment: Performed at [facility], [address], [place], [place] [address]  Brain natriuretic peptide     Status: None   Collection Time: 11/05/22  6:30 AM  Result Value Ref Range   B Natriuretic Peptide 30.1 0.0 - 100.0 pg/mL    Comment: Performed at [facility], [place], [place] [address]  Troponin I (High Sensitivity)     Status: Abnormal   Collection Time: 11/05/22  6:30 AM  Result Value Ref Range   Troponin I (High Sensitivity) 84 (H) <18 ng/L    Comment: (NOTE) Elevated high sensitivity troponin I (hsTnI) values and significant  changes across serial measurements may suggest ACS but many other  chronic and acute conditions are known to elevate hsTnI results.  Refer to the "Links" section for chest pain algorithms and additional  guidance. Performed at [facility], [address], [place], [place] [address]   Resp panel by RT-PCR (RSV, Flu A&B, Covid) Anterior Nasal Swab     Status: None   Collection Time: 11/05/22  6:30 AM   Specimen: Anterior Nasal Swab  Result Value Ref Range   SARS Coronavirus 2 by RT PCR NEGATIVE NEGATIVE    Comment: (NOTE) SARS-CoV-2 target nucleic acids are NOT DETECTED.  The SARS-CoV-2 RNA is generally detectable in upper respiratory specimens during the acute phase of infection. The lowest concentration of SARS-CoV-2 viral copies this assay can detect is 138 copies/mL. A negative result does not preclude SARS-Cov-2 infection and should not be used as the sole basis for treatment or other patient management decisions. A negative result may occur with  improper specimen collection/handling, submission of specimen other than nasopharyngeal swab, presence of viral mutation(s) within the areas targeted by this assay, and inadequate number of viral copies(<138 copies/mL). A negative result must be combined with clinical observations, patient history, and  epidemiological information. The expected result is Negative.  Fact Sheet for Patients:  EntrepreneurPulse.com.au  Fact Sheet for Healthcare Providers:  IncredibleEmployment.be  This test is no t yet approved or cleared by the [place] FDA and  has been authorized for detection and/or diagnosis of SARS-CoV-2 by FDA under an Emergency Use Authorization (EUA). This EUA will remain  in effect (meaning this test can be used) for the duration of the COVID-19 declaration under Section 564(b)(1) of the Act, 21 U.S.C.section 360bbb-3(b)(1), unless the authorization is terminated  or revoked sooner.       Influenza A by PCR NEGATIVE NEGATIVE   Influenza B by PCR NEGATIVE NEGATIVE    Comment: (NOTE) The Xpert Xpress SARS-CoV-2/FLU/RSV plus assay is intended as an aid in the diagnosis of influenza from Nasopharyngeal swab specimens and should not be used as a sole basis for treatment. Nasal washings and aspirates are unacceptable for Xpert Xpress SARS-CoV-2/FLU/RSV testing.  Fact Sheet for Patients: EntrepreneurPulse.com.au  Fact Sheet for Healthcare Providers: IncredibleEmployment.be  This test is not yet approved or cleared by the [place] FDA and has been authorized for detection and/or diagnosis of SARS-CoV-2 by FDA under an Emergency Use Authorization (EUA). This EUA will remain in effect (meaning this test can be used) for the duration of the COVID-19 declaration under Section 564(b)(1) of the Act, 21 U.S.C. section 360bbb-3(b)(1), unless the authorization is terminated or revoked.     Resp Syncytial Virus by PCR NEGATIVE NEGATIVE    Comment: (NOTE) Fact Sheet for Patients: EntrepreneurPulse.com.au  Fact Sheet for Healthcare Providers: IncredibleEmployment.be  This test is not yet approved or cleared by the [place] FDA and has been authorized for  detection and/or diagnosis of SARS-CoV-2 by FDA under an Emergency Use Authorization (EUA). This EUA will remain in effect (meaning this test can be used) for the duration of the COVID-19 declaration under Section 564(b)(1) of the Act, 21 U.S.C. section 360bbb-3(b)(1), unless the authorization is terminated or revoked.  Performed at [facility], [address] [place], [place] [address]   I-Stat arterial blood gas, ED [facility], [facility], [facility])     Status: Abnormal   Collection Time: 11/05/22  6:50 AM  Result Value Ref Range   pH, Arterial 7.364 7.35 - 7.45   pCO2 arterial 79.3 (HH) 32 - 48 mmHg   pO2, Arterial 55 (L) 83 - 108 mmHg   Bicarbonate 45.2 (H) 20.0 - 28.0 mmol/L   TCO2 48 (H) 22 - 32 mmol/L   O2 Saturation 85 %   Acid-Base Excess 17.0 (H) 0.0 - 2.0 mmol/L   Sodium 133 (L) 135 - 145 mmol/L   Potassium 3.4 (L) 3.5 - 5.1 mmol/L   Calcium, Ion 1.04 (L) 1.15 - 1.40 mmol/L   HCT 32.0 (L) 36.0 - 46.0 %   Hemoglobin 10.9 (L) 12.0 - 15.0 g/dL   Patient temperature 98.6 F    Collection site RADIAL, ALLEN'S TEST ACCEPTABLE    Drawn by RT    Sample type ARTERIAL    Comment NOTIFIED PHYSICIAN   Troponin I (High Sensitivity)     Status: Abnormal   Collection Time: 11/05/22  8:05 AM  Result Value Ref Range   Troponin I (High Sensitivity) 142 (HH) <18 ng/L    Comment: DELTA CHECK NOTED CRITICAL RESULT CALLED TO, READ BACK BY AND VERIFIED WITH: DOSS,M,RN @ [id number] 11/05/22 BY GWYN,P (NOTE) Elevated high sensitivity troponin I (hsTnI) values and significant  changes across serial measurements may suggest ACS but  many other  chronic and acute conditions are known to elevate hsTnI results.  Refer to the Links section for chest pain algorithms and additional  guidance. Performed at [facility], [address], [place], [place] [address]    DG Chest Portable 1 View  Result Date: 11/05/2022 CLINICAL DATA:  62 year old female with shortness of breath.  Smoker. EXAM: PORTABLE CHEST 1 VIEW COMPARISON:  CT Chest, Abdomen, and Pelvis 08/11/2022 and earlier. FINDINGS: Portable AP upright view at 0634 hours. Stable somewhat low lung volumes. Mediastinal contours remain within normal limits. Visualized tracheal air column is within normal limits. Allowing for portable technique the lungs are clear. No pneumothorax or pleural effusion. Paucity bowel gas in the visible abdomen. No acute osseous abnormality identified. IMPRESSION: No acute cardiopulmonary abnormality. Electronically Signed   By: Genevie Ann M.D.   On: 11/05/2022 06:44    Pending Labs Unresulted Labs (From admission, onward)    None       Vitals/Pain Today's Vitals   11/05/22 1233 11/05/22 1300 11/05/22 1400 11/05/22 1500  BP:    (!) 136/109  Pulse:  86 88 88  Resp:  18 18 18  $ Temp:      TempSrc:      SpO2:  94% 95% 97%  Weight:      Height:      PainSc: 6        Isolation Precautions No active isolations  Medications Medications  lactated ringers infusion ( Intravenous New Bag/Given 11/05/22 0751)  oxyCODONE (Oxy IR/ROXICODONE) immediate release tablet 15 mg (15 mg Oral Given 11/05/22 1355)  acetaminophen (TYLENOL) tablet 650 mg (has no administration in time range)  sodium bicarbonate injection 50 mEq (50 mEq Intravenous Given 11/05/22 0629)  aspirin chewable tablet 324 mg (324 mg Oral Given 11/05/22 0748)    Mobility walks     Focused Assessments   R Recommendations: See Admitting Provider Note  Report given to:   Additional Notes:

## 2022-11-06 DIAGNOSIS — J9621 Acute and chronic respiratory failure with hypoxia: Secondary | ICD-10-CM | POA: Diagnosis not present

## 2022-11-06 DIAGNOSIS — Z79899 Other long term (current) drug therapy: Secondary | ICD-10-CM | POA: Diagnosis not present

## 2022-11-06 DIAGNOSIS — J45909 Unspecified asthma, uncomplicated: Secondary | ICD-10-CM | POA: Diagnosis not present

## 2022-11-06 DIAGNOSIS — J449 Chronic obstructive pulmonary disease, unspecified: Secondary | ICD-10-CM | POA: Diagnosis not present

## 2022-11-06 DIAGNOSIS — R7989 Other specified abnormal findings of blood chemistry: Secondary | ICD-10-CM | POA: Diagnosis not present

## 2022-11-06 DIAGNOSIS — J9611 Chronic respiratory failure with hypoxia: Secondary | ICD-10-CM | POA: Diagnosis not present

## 2022-11-06 DIAGNOSIS — I2510 Atherosclerotic heart disease of native coronary artery without angina pectoris: Secondary | ICD-10-CM | POA: Diagnosis not present

## 2022-11-06 DIAGNOSIS — E119 Type 2 diabetes mellitus without complications: Secondary | ICD-10-CM | POA: Diagnosis not present

## 2022-11-06 DIAGNOSIS — I10 Essential (primary) hypertension: Secondary | ICD-10-CM | POA: Diagnosis not present

## 2022-11-06 DIAGNOSIS — R188 Other ascites: Secondary | ICD-10-CM | POA: Diagnosis not present

## 2022-11-06 DIAGNOSIS — Z7952 Long term (current) use of systemic steroids: Secondary | ICD-10-CM | POA: Diagnosis not present

## 2022-11-06 DIAGNOSIS — J9622 Acute and chronic respiratory failure with hypercapnia: Secondary | ICD-10-CM | POA: Diagnosis not present

## 2022-11-06 DIAGNOSIS — E871 Hypo-osmolality and hyponatremia: Secondary | ICD-10-CM | POA: Diagnosis not present

## 2022-11-06 DIAGNOSIS — E876 Hypokalemia: Secondary | ICD-10-CM | POA: Diagnosis not present

## 2022-11-06 DIAGNOSIS — K7460 Unspecified cirrhosis of liver: Secondary | ICD-10-CM | POA: Diagnosis not present

## 2022-11-06 DIAGNOSIS — Z1152 Encounter for screening for COVID-19: Secondary | ICD-10-CM | POA: Diagnosis not present

## 2022-11-06 LAB — CBC
HCT: 29.9 % — ABNORMAL LOW (ref 36.0–46.0)
Hemoglobin: 9.1 g/dL — ABNORMAL LOW (ref 12.0–15.0)
MCH: 24.7 pg — ABNORMAL LOW (ref 26.0–34.0)
MCHC: 30.4 g/dL (ref 30.0–36.0)
MCV: 81 fL (ref 80.0–100.0)
Platelets: 150 10*3/uL (ref 150–400)
RBC: 3.69 MIL/uL — ABNORMAL LOW (ref 3.87–5.11)
RDW: 16.4 % — ABNORMAL HIGH (ref 11.5–15.5)
WBC: 6.5 10*3/uL (ref 4.0–10.5)
nRBC: 0 % (ref 0.0–0.2)

## 2022-11-06 LAB — BASIC METABOLIC PANEL
Anion gap: 10 (ref 5–15)
BUN: 6 mg/dL — ABNORMAL LOW (ref 8–23)
CO2: 33 mmol/L — ABNORMAL HIGH (ref 22–32)
Calcium: 7.5 mg/dL — ABNORMAL LOW (ref 8.9–10.3)
Chloride: 89 mmol/L — ABNORMAL LOW (ref 98–111)
Creatinine, Ser: 0.87 mg/dL (ref 0.44–1.00)
GFR, Estimated: >60 mL/min (ref >60)
Glucose, Bld: 117 mg/dL — ABNORMAL HIGH (ref 70–99)
Potassium: 3.6 mmol/L (ref 3.5–5.1)
Sodium: 132 mmol/L — ABNORMAL LOW (ref 135–145)

## 2022-11-06 LAB — GLUCOSE, CAPILLARY
Glucose-Capillary: 123 mg/dL — ABNORMAL HIGH (ref 70–99)
Glucose-Capillary: 129 mg/dL — ABNORMAL HIGH (ref 70–99)
Glucose-Capillary: 127 mg/dL — ABNORMAL HIGH (ref 70–99)

## 2022-11-06 LAB — MAGNESIUM: Magnesium: 1.6 mg/dL — ABNORMAL LOW (ref 1.7–2.4)

## 2022-11-06 NOTE — Discharge Summary (Addendum)
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 authorization is terminated or revoked.     Resp Syncytial Virus by PCR NEGATIVE NEGATIVE Final    Comment: (NOTE) Fact Sheet for Patients: EntrepreneurPulse.com.au  Fact Sheet for Healthcare Providers: IncredibleEmployment.be  This test is not yet approved or cleared by the [place] FDA and has been authorized for detection and/or diagnosis of SARS-CoV-2 by FDA under an  Emergency Use Authorization (EUA). This EUA will remain in effect (meaning this test can be used) for the duration of the COVID-19 declaration under Section 564(b)(1) of the Act, 21 U.S.C. section 360bbb-3(b)(1), unless the authorization is terminated or revoked.  Performed at [facility], [address] [place], [place] [address]      Time coordinating discharge: Over 30 minutes  SIGNED:   Phillips Climes, MD  [facility] 11/06/2022, 11:55 AM Pager   If 7PM-7AM, please contact night-coverage [email or web address]

## 2022-11-06 NOTE — Plan of Care (Signed)

## 2022-11-06 NOTE — Discharge Instructions (Signed)
Follow with Primary MD Shirline Frees, MD in 7 days   Get CBC, CMP, checked  by Primary MD next visit.    Activity: As tolerated with Full fall precautions use walker/cane & assistance as needed   Disposition Home    Diet: Heart Healthy   On your next visit with your primary care physician please Get Medicines reviewed and adjusted.   Please request your Prim.MD to go over all Hospital Tests and Procedure/Radiological results at the follow up, please get all Hospital records sent to your Prim MD by signing hospital release before you go home.   If you experience worsening of your admission symptoms, develop shortness of breath, life threatening emergency, suicidal or homicidal thoughts you must seek medical attention immediately by calling 911 or calling your MD immediately  if symptoms less severe.  You Must read complete instructions/literature along with all the possible adverse reactions/side effects for all the Medicines you take and that have been prescribed to you. Take any new Medicines after you have completely understood and accpet all the possible adverse reactions/side effects.   Do not drive, operating heavy machinery, perform activities at heights, swimming or participation in water activities or provide baby sitting services if your were admitted for syncope or siezures until you have seen by Primary MD or a Neurologist and advised to do so again.  Do not drive when taking Pain medications.    Do not take more than prescribed Pain, Sleep and Anxiety Medications  Special Instructions: If you have smoked or chewed Tobacco  in the last 2 yrs please stop smoking, stop any regular Alcohol  and or any Recreational drug use.  Wear Seat belts while driving.   Please note  You were cared for by a hospitalist during your hospital stay. If you have any questions about your discharge medications or the care you received while you were in the hospital after you are discharged,  you can call the unit and asked to speak with the hospitalist on call if the hospitalist that took care of you is not available. Once you are discharged, your primary care physician will handle any further medical issues. Please note that NO REFILLS for any discharge medications will be authorized once you are discharged, as it is imperative that you return to your primary care physician (or establish a relationship with a primary care physician if you do not have one) for your aftercare needs so that they can reassess your need for medications and monitor your lab values.

## 2022-11-06 NOTE — TOC Transition Note (Addendum)
Transition of Care [facility]) - CM/SW Discharge Note   Patient Details  Name: [name] MRN: [id number] Date of Birth: Aug 29, 1961  [facility]) CM/SW Contact:  Zenon Mayo, RN Phone Number: 11/06/2022, 12:45 PM   Clinical Narrative:    Patient has oxygen in her room , her spouse will transport her home at dc.  [employer] will work with patient outpatient regarding the bipap.  Patient states she has a walker and a cane at home , and a power w/chair.  She does not have any HH needs.         Patient Goals and CMS Choice      Discharge Placement                         Discharge Plan and Services Additional resources added to the After Visit Summary for                                       Social Determinants of Health (SDOH) Interventions SDOH Screenings   Food Insecurity: No Food Insecurity (11/05/2022)  Housing: Low Risk  (11/05/2022)  Transportation Needs: No Transportation Needs (11/05/2022)  Utilities: Not At Risk (11/05/2022)  Tobacco Use: High Risk (11/05/2022)     Readmission Risk Interventions     No data to display

## 2022-11-06 NOTE — TOC Progression Note (Signed)
Transition of Care [facility]) - Progression Note    Patient Details  Name: [name] MRN: [id number] Date of Birth: [birth year]  [facility]) CM/SW Contact  Zenon Mayo, RN Phone Number: 11/06/2022, 11:16 AM  Clinical Narrative:    NCM spoke with patient at the bedside, offered choice for home oxygen and trilogy, she does not have a preference, NCM made referral to [facility] with [facility] for home oxygen and to work with patient outpatient for NIV.        Expected Discharge Plan and Services                                               Social Determinants of Health (SDOH) Interventions SDOH Screenings   Food Insecurity: No Food Insecurity (11/05/2022)  Housing: Low Risk  (11/05/2022)  Transportation Needs: No Transportation Needs (11/05/2022)  Utilities: Not At Risk (11/05/2022)  Tobacco Use: High Risk (11/05/2022)    Readmission Risk Interventions     No data to display

## 2022-11-06 NOTE — Care Management Obs Status (Signed)
[facility] NOTIFICATION   Patient Details  Name: [name] MRN: [id number] Date of Birth: 1961-07-22   Medicare Observation Status Notification Given:  Yes    Zenon Mayo, RN 11/06/2022, 12:16 PM

## 2022-11-07 ENCOUNTER — Emergency Department (HOSPITAL_BASED_OUTPATIENT_CLINIC_OR_DEPARTMENT_OTHER): Payer: 59 | Admitting: Radiology

## 2022-11-07 ENCOUNTER — Other Ambulatory Visit: Payer: Self-pay

## 2022-11-07 ENCOUNTER — Encounter (HOSPITAL_BASED_OUTPATIENT_CLINIC_OR_DEPARTMENT_OTHER): Payer: Self-pay

## 2022-11-07 ENCOUNTER — Emergency Department (HOSPITAL_BASED_OUTPATIENT_CLINIC_OR_DEPARTMENT_OTHER)
Admission: EM | Admit: 2022-11-07 | Discharge: 2022-11-07 | Disposition: A | Discharge status: Home / Self Care | Payer: 59 | Attending: Emergency Medicine | Admitting: Emergency Medicine

## 2022-11-07 DIAGNOSIS — Z7984 Long term (current) use of oral hypoglycemic drugs: Secondary | ICD-10-CM | POA: Diagnosis not present

## 2022-11-07 DIAGNOSIS — M545 Low back pain, unspecified: Secondary | ICD-10-CM | POA: Insufficient documentation

## 2022-11-07 DIAGNOSIS — G8929 Other chronic pain: Secondary | ICD-10-CM | POA: Insufficient documentation

## 2022-11-07 DIAGNOSIS — R0602 Shortness of breath: Secondary | ICD-10-CM | POA: Diagnosis not present

## 2022-11-07 DIAGNOSIS — Z7985 Long-term (current) use of injectable non-insulin antidiabetic drugs: Secondary | ICD-10-CM | POA: Diagnosis not present

## 2022-11-07 DIAGNOSIS — J9622 Acute and chronic respiratory failure with hypercapnia: Secondary | ICD-10-CM | POA: Diagnosis not present

## 2022-11-07 DIAGNOSIS — M546 Pain in thoracic spine: Secondary | ICD-10-CM | POA: Insufficient documentation

## 2022-11-07 DIAGNOSIS — M62830 Muscle spasm of back: Secondary | ICD-10-CM | POA: Diagnosis not present

## 2022-11-07 DIAGNOSIS — J9621 Acute and chronic respiratory failure with hypoxia: Secondary | ICD-10-CM | POA: Diagnosis not present

## 2022-11-07 DIAGNOSIS — I10 Essential (primary) hypertension: Secondary | ICD-10-CM | POA: Insufficient documentation

## 2022-11-07 DIAGNOSIS — J449 Chronic obstructive pulmonary disease, unspecified: Secondary | ICD-10-CM | POA: Insufficient documentation

## 2022-11-07 DIAGNOSIS — M62838 Other muscle spasm: Secondary | ICD-10-CM | POA: Diagnosis not present

## 2022-11-07 DIAGNOSIS — I2510 Atherosclerotic heart disease of native coronary artery without angina pectoris: Secondary | ICD-10-CM | POA: Insufficient documentation

## 2022-11-07 DIAGNOSIS — Z79899 Other long term (current) drug therapy: Secondary | ICD-10-CM | POA: Diagnosis not present

## 2022-11-07 DIAGNOSIS — J441 Chronic obstructive pulmonary disease with (acute) exacerbation: Secondary | ICD-10-CM | POA: Diagnosis not present

## 2022-11-07 DIAGNOSIS — J9811 Atelectasis: Secondary | ICD-10-CM | POA: Diagnosis not present

## 2022-11-07 LAB — CBC
HCT: 31.5 % — ABNORMAL LOW (ref 36.0–46.0)
Hemoglobin: 9.5 g/dL — ABNORMAL LOW (ref 12.0–15.0)
MCH: 24.6 pg — ABNORMAL LOW (ref 26.0–34.0)
MCHC: 30.2 g/dL (ref 30.0–36.0)
MCV: 81.6 fL (ref 80.0–100.0)
Platelets: 163 10*3/uL (ref 150–400)
RBC: 3.86 MIL/uL — ABNORMAL LOW (ref 3.87–5.11)
RDW: 17.1 % — ABNORMAL HIGH (ref 11.5–15.5)
WBC: 8 10*3/uL (ref 4.0–10.5)
nRBC: 0 % (ref 0.0–0.2)

## 2022-11-07 LAB — BASIC METABOLIC PANEL
Anion gap: 8 (ref 5–15)
BUN: 17 mg/dL (ref 8–23)
CO2: 32 mmol/L (ref 22–32)
Calcium: 9.1 mg/dL (ref 8.9–10.3)
Chloride: 89 mmol/L — ABNORMAL LOW (ref 98–111)
Creatinine, Ser: 1.04 mg/dL — ABNORMAL HIGH (ref 0.44–1.00)
GFR, Estimated: >60 mL/min (ref >60)
Glucose, Bld: 98 mg/dL (ref 70–99)
Potassium: 4.6 mmol/L (ref 3.5–5.1)
Sodium: 129 mmol/L — ABNORMAL LOW (ref 135–145)

## 2022-11-07 LAB — TROPONIN I (HIGH SENSITIVITY)
Troponin I (High Sensitivity): 75 ng/L — ABNORMAL HIGH (ref <18)
Troponin I (High Sensitivity): 88 ng/L — ABNORMAL HIGH (ref <18)

## 2022-11-07 MED ORDER — LIDOCAINE 5 % EX PTCH: 1.0000 | MEDICATED_PATCH | CUTANEOUS | 0 refills | Status: AC

## 2022-11-07 MED ORDER — FENTANYL CITRATE PF 50 MCG/ML IJ SOSY
50.0000 ug | PREFILLED_SYRINGE | Freq: Once | INTRAMUSCULAR | Status: AC
  Administered 2022-11-07: 50 ug via INTRAVENOUS
  Filled 2022-11-07: qty 1

## 2022-11-07 NOTE — Discharge Instructions (Addendum)
Your history, exam, workup today were overall reassuring.  Your labs appeared similar to prior with improved cardiac enzymes than your admission several days ago.  I suspect your back pain is worsened by the recent increase in breathing and coughing you have had and this has exacerbated your back spasm and pain.  Please continue her home medicines and add the Lidoderm patches.  Please call your pain doctor to discuss medication regimen changes.  Your imaging today did not show evidence of new fracture in your back and it did not show pneumonia.  Given your reassuring vital signs, we feel you are safe for discharge home.  If any symptoms change or worsen acutely, please return to the nearest emergency department.

## 2022-11-07 NOTE — ED Notes (Addendum)
Patient back from imaging

## 2022-11-07 NOTE — ED Notes (Signed)
Patient woken up by staff and reports that her pain has returned in her abdomen.  She's requesting fentanyl over home oxycodone because she reports "it doesn't work."  MD Tegeler made aware.

## 2022-11-07 NOTE — ED Provider Notes (Signed)
[facility] Provider Note   CSN: [id number] Arrival date & time: 11/07/22  1707     History  Chief Complaint  Patient presents with   Shortness of Breath    [name] is a 62 y.o. female.  The history is provided by the patient and medical records.  Back Pain Location:  Lumbar spine and thoracic spine Quality:  Cramping and aching Radiates to:  Does not radiate Pain severity:  Severe Pain is:  Unable to specify Onset quality:  Gradual Duration:  2 days Timing:  Constant Progression:  Waxing and waning Chronicity:  Chronic Relieved by:  Nothing Worsened by:  Coughing Ineffective treatments:  None tried Associated symptoms: no abdominal pain, no abdominal swelling (unchanged from baseline), no chest pain, no dysuria, no fever, no headaches, no leg pain, no numbness and no weakness        Home Medications Prior to Admission medications   Medication Sig Start Date End Date Taking? Authorizing Provider  acetaminophen (TYLENOL) 500 MG tablet Take 500 mg by mouth every 6 (six) hours as needed.    [provider]  albuterol (PROVENTIL HFA;VENTOLIN HFA) 108 (90 BASE) MCG/ACT inhaler Inhale 2 puffs into the lungs every 6 (six) hours as needed for wheezing or shortness of breath.    [provider]  albuterol (PROVENTIL) (2.5 MG/3ML) 0.083% nebulizer solution Take 2.5 mg by nebulization every 6 (six) hours as needed for wheezing or shortness of breath.    [provider]  ALPRAZolam Duanne Moron) 1 MG tablet Take by mouth See admin instructions. Take one tablet (1 mg) by mouth in the morning and two tablets (2 mg) in the evening.    [provider]  amLODipine (NORVASC) 5 MG tablet Take 5 mg by mouth daily.    [provider]  baclofen (LIORESAL) 10 MG tablet Take 10 mg by mouth 2 (two) times daily.    [provider]  benazepril (LOTENSIN) 40 MG tablet Take 40 mg by mouth daily.     [provider]  Benzocaine-Resorcinol (VAGISIL EX) Apply 1 application topically daily.    [provider]  budesonide (PULMICORT) 1 MG/2ML nebulizer solution Take 1 mg by nebulization daily.    [provider]  ciprofloxacin (CIPRO) 500 MG tablet Take 1 tablet (500 mg total) by mouth daily with breakfast. 08/18/22   Regalado, Belkys A, MD  cloNIDine (CATAPRES) 0.2 MG tablet Take 0.2 mg by mouth 2 (two) times daily. 02/05/21   [provider]  cyclobenzaprine (FLEXERIL) 10 MG tablet Take 10 mg by mouth 3 (three) times daily as needed for muscle spasms.    [provider]  diclofenac Sodium (VOLTAREN) 1 % GEL Apply 1 Application topically 4 (four) times daily.    [provider]  docusate sodium (COLACE) 250 MG capsule Take 250 mg by mouth daily as needed for constipation.    [provider]  estradiol (ESTRACE) 2 MG tablet Take 2 mg by mouth daily.     [provider]  fluticasone (FLONASE) 50 MCG/ACT nasal spray Place 1 spray into both nostrils daily as needed for allergies.    [provider]  furosemide (LASIX) 20 MG tablet Take 1 tablet (20 mg total) by mouth daily. 08/16/22   Regalado, Belkys A, MD  Glycerin-Polysorbate 80 (REFRESH DRY EYE THERAPY OP) Place 1 drop into both eyes daily as needed (dry eyes).    [provider]  hydrOXYzine (ATARAX/VISTARIL) 25 MG  tablet Take 25 mg by mouth every 8 (eight) hours as needed for anxiety.    [provider]  ipratropium-albuterol (DUONEB) 0.5-2.5 (3) MG/3ML SOLN Take 3 mLs by nebulization every 6 (six) hours as needed (shortness of breath).    [provider]  lactulose (CHRONULAC) 10 GM/15ML solution Take 15 mLs by mouth 2 (two) times daily as needed. 10/22/22   [provider]  levocetirizine (XYZAL) 5 MG tablet Take 5 mg by mouth every evening.  09/16/19   [provider]  lidocaine (XYLOCAINE) 5 % ointment Apply 1 application  topically 2 (two) times daily as needed (hemorrhoids).    [provider]  metFORMIN (GLUCOPHAGE) 500 MG tablet Take 500 mg by mouth 2 (two) times daily with a meal.    [provider]  mupirocin ointment (BACTROBAN) 2 % Place into the nose. 10/27/22   [provider]  naloxone Karma Greaser) 4 MG/0.1ML LIQD nasal spray kit Place 1 spray into the nose as needed (for overdose).     [provider]  nystatin (MYCOSTATIN/NYSTOP) powder Apply 1 g topically 4 (four) times daily.    [provider]  oxyCODONE (ROXICODONE) 15 MG immediate release tablet Take 1 tablet (15 mg total) by mouth every 8 (eight) hours as needed for pain. Patient taking differently: Take 15 mg by mouth every 4 (four) hours. 03/29/21   [name], MD  pantoprazole (PROTONIX) 40 MG tablet Take 40 mg by mouth 2 (two) times daily.     [provider]  PHAZYME MAXIMUM STRENGTH 250 MG CAPS Take 180 mg by mouth 4 (four) times daily - after meals and at bedtime.    [provider]  polyethylene glycol (MIRALAX / GLYCOLAX) 17 g packet Take 17 g by mouth 2 (two) times daily. Patient taking differently: Take 17 g by mouth as directed. 08/16/22   Regalado, Belkys A, MD  psyllium (METAMUCIL) 58.6 % packet Take 1 packet by mouth daily.    [provider]  rizatriptan (MAXALT-MLT) 10 MG disintegrating tablet Take 10 mg by mouth as needed for migraine. May repeat in 2 hours if needed Rizatriptan benzoate '10mg'$  tablet disintegrating    [provider]  SEMAGLUTIDE-WEIGHT MANAGEMENT [place] Inject into the skin. Ozempic    [provider]  senna-docusate (SENOKOT-S) 8.6-50 MG tablet Take 1 tablet by mouth 2 (two) times daily. Patient not taking: Reported on 11/05/2022 08/16/22   Regalado, Jerald Kief A, MD  simvastatin (ZOCOR) 20 MG tablet Take 20 mg by mouth at bedtime.    [provider]  SYMBICORT 160-4.5 MCG/ACT inhaler Inhale 2 puffs into the lungs in the  morning and at bedtime.  02/09/20   [provider]  topiramate (TOPAMAX) 50 MG tablet Take 50 mg by mouth daily.    [provider]  torsemide (DEMADEX) 20 MG tablet Take 20-40 mg by mouth daily.    [provider]  triamcinolone cream (KENALOG) 0.1 % Apply 1 application topically 2 (two) times daily as needed (rash).    [provider]      Allergies    Spironolactone, Gabapentin, Sulfamethoxazole-trimethoprim, Amoxicillin, Clindamycin/lincomycin, Doxycycline, Treximet [sumatriptan-naproxen sodium], Trimethoprim, and Bactrim    Review of Systems   Review of Systems  Constitutional:  Negative for chills, fatigue and fever.  HENT:  Negative for congestion.   Respiratory:  Negative for cough, chest tightness, shortness of breath and wheezing.   Cardiovascular:  Negative for chest pain, palpitations and leg swelling (at baseline per  pt).  Gastrointestinal:  Negative for abdominal pain, constipation, diarrhea, nausea and vomiting.  Genitourinary:  Negative for dysuria and flank pain.  Musculoskeletal:  Positive for back pain. Negative for neck pain and neck stiffness.  Skin:  Negative for rash and wound.  Neurological:  Negative for weakness, light-headedness, numbness and headaches.  Psychiatric/Behavioral:  Negative for agitation and confusion.   All other systems reviewed and are negative.   Physical Exam Updated Vital Signs BP (!) 114/59 (BP Location: Right Arm)   Pulse 83   Temp 97.7 F (36.5 C)   Resp (!) 22   Ht 5' (1.524 m)   Wt 106.1 kg   SpO2 (!) 89%   BMI 45.68 kg/m  Physical Exam Vitals and nursing note reviewed.  Constitutional:      General: She is not in acute distress.    Appearance: She is well-developed. She is not ill-appearing, toxic-appearing or diaphoretic.  HENT:     Head: Normocephalic and atraumatic.     Mouth/Throat:     Mouth: Mucous membranes are moist.  Eyes:     Conjunctiva/sclera: Conjunctivae normal.      Pupils: Pupils are equal, round, and reactive to light.  Cardiovascular:     Rate and Rhythm: Normal rate and regular rhythm.     Heart sounds: No murmur heard. Pulmonary:     Effort: Pulmonary effort is normal. No tachypnea or respiratory distress.     Breath sounds: Rhonchi present. No wheezing or rales.  Chest:     Chest wall: No tenderness.  Abdominal:     Palpations: Abdomen is soft.     Tenderness: There is no abdominal tenderness.  Musculoskeletal:        General: No swelling.     Cervical back: Neck supple.     Right lower leg: Edema present.     Left lower leg: Edema present.  Skin:    General: Skin is warm and dry.     Capillary Refill: Capillary refill takes less than 2 seconds.     Findings: No erythema.  Neurological:     General: No focal deficit present.     Mental Status: She is alert.  Psychiatric:        Mood and Affect: Mood normal.     ED Results / Procedures / Treatments   Labs (all labs ordered are listed, but only abnormal results are displayed) Labs Reviewed  BASIC METABOLIC PANEL - Abnormal; Notable for the following components:      Result Value   Sodium 129 (*)    Chloride 89 (*)    Creatinine, Ser 1.04 (*)    All other components within normal limits  CBC - Abnormal; Notable for the following components:   RBC 3.86 (*)    Hemoglobin 9.5 (*)    HCT 31.5 (*)    MCH 24.6 (*)    RDW 17.1 (*)    All other components within normal limits  TROPONIN I (HIGH SENSITIVITY) - Abnormal; Notable for the following components:   Troponin I (High Sensitivity) 75 (*)    All other components within normal limits  TROPONIN I (HIGH SENSITIVITY) - Abnormal; Notable for the following components:   Troponin I (High Sensitivity) 88 (*)    All other components within normal limits  URINALYSIS, ROUTINE W REFLEX MICROSCOPIC    EKG EKG Interpretation  Date/Time:  Friday November 07 2022 17:22:40 EST Ventricular Rate:  84 PR Interval:  118 QRS  Duration: 88 QT Interval:  436 QTC Calculation: 515 R Axis:   80 Text Interpretation: Normal sinus rhythm Prolonged QT Abnormal ECG When compared with ECG of 06-Nov-2022 00:18, No significant change was found when compared to prior, similar appearance No STEMI Confirmed by Antony Blackbird [phone]) on 11/07/2022 6:12:19 PM  Radiology DG Chest 2 View  Result Date: 11/07/2022 CLINICAL DATA:  Shortness of breath. Recent admission and discharge for COPD exacerbation. EXAM: CHEST - 2 VIEW COMPARISON:  Radiograph 11/05/2022, CT 08/11/2022 FINDINGS: Patient's chin obscures the left lung apex. Stable heart size and mediastinal contours. Subsegmental atelectasis in the left mid lung. No confluent consolidation. No pleural fluid, pulmonary edema or pneumothorax. Stable osseous structures. IMPRESSION: Subsegmental atelectasis in the left mid lung. No other acute findings. Electronically Signed   By: Keith Rake M.D.   On: 11/07/2022 18:03    Procedures Procedures    Medications Ordered in ED Medications  fentaNYL (SUBLIMAZE) injection 50 mcg (50 mcg Intravenous Given 11/07/22 1838)  fentaNYL (SUBLIMAZE) injection 50 mcg (50 mcg Intravenous Given 11/07/22 2145)    ED Course/ Medical Decision Making/ A&P                             Medical Decision Making Amount and/or Complexity of Data Reviewed Labs: ordered. Radiology: ordered.  Risk Prescription drug management.    CURTISA HELMKAMP is a 62 y.o. female with a past medical history significant for CAD, ascites and NASH cirrhosis, seizures, obesity, migraines, hypertension, hyperlipidemia, diabetes, and recent admission for COPD exacerbation and discharged yesterday on home oxygen who presents with worsened back pain.  According to patient, she just got her oxygen set up today and feels that now she is on oxygen she is breathing better.  She denies any new fevers, chills, congestion and reports the cough is improved.  She denies that worsening.  She  denies any chest pain or palpitations.  Denies any nausea, vomiting, or abdominal pain.  Denies any constipation, diarrhea, or urinary changes.  She says that her edema in her legs is improved.  She says that she is managed by a pain management physician and she is on oxycodone daily and normally it helps but has not helped today.  She reports the pain is severe in her low back.  She denies trauma but feels that it feels like she had a fall.  Denies any new leg symptoms.  On exam, lungs were slightly coarse in the bases but otherwise clear.  No wheezing or rales.  Chest nontender.  Abdomen nontender.  Low back is tender paraspinally but no midline tenderness.  No bruising or lacerations to indicate trauma.  Legs slightly edematous but otherwise reportedly unchanged from baseline.  Intact pulses.  Bowel sounds appreciated.  Abdomen completely nontender and patient reports it does not feel like when she had SBP in the past.  She is on antibiotics chronically to prevent it again.  Clinically I suspect patient has been breathing heavier and having coughing from her recent admission and this may have exacerbated her chronic back pain.  She reports the pain is in the same location as her chronic pain but is just worse.  I spoke she may have pulled a muscle in the paraspinal low back.  That being said, given her history of severe pain, we will get x-ray of the thoracic and lumbar spine to make sure there is not a new fracture or compression injury.  Will also get urinalysis to  make sure she does not have a UTI or pyelonephritis.  She had some screening labs in triage that were similar to prior.  Her troponin was 75 which is actually improved from several days ago.  Will trend to make sure it is not rising precipitously.  Otherwise her CBC shows no leukocytosis and her hemoglobin is mildly anemic.  Metabolic panel appears similar with a sodium of 129 when it was 130 recently.  Creatinine reassuring around 1.04.  Other  electrolytes overall similar.  Chest x-ray showed some atelectasis but no acute pneumonia.  She reports the pain in her back does not come from the front and is not sharp and stabbing.  Doubt aortic etiology of symptoms at this time.  Given lack of any chest pain or upper back pain, doubt PE as cause of symptoms.  Patient agrees with plan of getting some x-rays and given her dose of pain medicine and check urine.  If other trouble is ruled out and she is starting to feel better, dissipate discharge home now that she has oxygen at home to manage the shortness of breath which she is not complaining of and has resolved on oxygen and likely PCP follow-up.  11:02 PM Imaging of the back did not show evidence of acute fracture.  Known degenerative changes seen and we discussed with the patient.  Chest x-ray did not show pneumonia and her pain improved with medications.  Suspect this is exacerbation of chronic back pain with her recent coughing.  Patient appears stable for discharge home and will add Lidoderm patches to her home regimen.  She will call her pain doctor and discussed management.  She is already on pain medicine and muscle relaxants will not add to that.  Patient agrees with plan of care and was discharged in good condition with reassuring vital signs.         Final Clinical Impression(s) / ED Diagnoses Final diagnoses:  Chronic low back pain without sciatica, unspecified back pain laterality  Muscle spasm    Rx / DC Orders ED Discharge Orders          Ordered    lidocaine (LIDODERM) 5 %  Every 24 hours        11/07/22 2259            Clinical Impression: 1. Chronic low back pain without sciatica, unspecified back pain laterality   2. Muscle spasm     Disposition: Discharge  Condition: Good  I have discussed the results, Dx and Tx plan with the pt(& family if present). He/she/they expressed understanding and agree(s) with the plan. Discharge instructions discussed at  great length. Strict return precautions discussed and pt &/or family have verbalized understanding of the instructions. No further questions at time of discharge.    New Prescriptions   LIDOCAINE (LIDODERM) 5 %    Place 1 patch onto the skin daily. Remove & Discard patch within 12 hours or as directed by MD    Follow Up: Shirline Frees, MD [place] [address] [phone]     your pain team and back doctor         Nuriya Stuck, Gwenyth Allegra, MD 11/07/22 2303

## 2022-11-07 NOTE — ED Notes (Signed)
EDP at bedside  

## 2022-11-07 NOTE — ED Notes (Addendum)
Patient to imaging.

## 2022-11-07 NOTE — ED Triage Notes (Signed)
Patient here POV from Home.  Endorses SOB that began a few days ago. Was admitted for COPD Exacerbation and discharged yesterday. Home Care established Supplemental Oxygen and CPAP.  Seeks Evaluation for Worsened SOB.  NAD Noted during Triage. A&Ox4. GCS 15. BIB Wheelchair.

## 2022-11-16 DIAGNOSIS — J449 Chronic obstructive pulmonary disease, unspecified: Secondary | ICD-10-CM | POA: Diagnosis not present

## 2022-11-28 DIAGNOSIS — G8929 Other chronic pain: Secondary | ICD-10-CM | POA: Diagnosis not present

## 2022-11-28 DIAGNOSIS — R109 Unspecified abdominal pain: Secondary | ICD-10-CM | POA: Diagnosis not present

## 2022-11-28 DIAGNOSIS — Z79899 Other long term (current) drug therapy: Secondary | ICD-10-CM | POA: Diagnosis not present

## 2022-11-28 DIAGNOSIS — M545 Low back pain, unspecified: Secondary | ICD-10-CM | POA: Diagnosis not present

## 2022-11-28 DIAGNOSIS — K7460 Unspecified cirrhosis of liver: Secondary | ICD-10-CM | POA: Diagnosis not present

## 2022-11-28 DIAGNOSIS — M542 Cervicalgia: Secondary | ICD-10-CM | POA: Diagnosis not present

## 2022-11-28 DIAGNOSIS — J449 Chronic obstructive pulmonary disease, unspecified: Secondary | ICD-10-CM | POA: Diagnosis not present

## 2022-12-03 ENCOUNTER — Other Ambulatory Visit: Payer: Self-pay

## 2022-12-03 ENCOUNTER — Emergency Department (HOSPITAL_COMMUNITY): Payer: 59

## 2022-12-03 ENCOUNTER — Encounter (HOSPITAL_COMMUNITY): Payer: Self-pay | Admitting: *Deleted

## 2022-12-03 ENCOUNTER — Inpatient Hospital Stay (HOSPITAL_COMMUNITY)
Admission: EM | Admit: 2022-12-03 | Discharge: 2022-12-07 | DRG: 189 | Disposition: A | Discharge status: Home / Self Care | Payer: 59 | Attending: Internal Medicine | Admitting: Internal Medicine

## 2022-12-03 DIAGNOSIS — K7581 Nonalcoholic steatohepatitis (NASH): Secondary | ICD-10-CM | POA: Diagnosis present

## 2022-12-03 DIAGNOSIS — K7460 Unspecified cirrhosis of liver: Secondary | ICD-10-CM | POA: Diagnosis not present

## 2022-12-03 DIAGNOSIS — E1159 Type 2 diabetes mellitus with other circulatory complications: Secondary | ICD-10-CM | POA: Diagnosis present

## 2022-12-03 DIAGNOSIS — D638 Anemia in other chronic diseases classified elsewhere: Secondary | ICD-10-CM | POA: Diagnosis present

## 2022-12-03 DIAGNOSIS — E871 Hypo-osmolality and hyponatremia: Secondary | ICD-10-CM | POA: Diagnosis not present

## 2022-12-03 DIAGNOSIS — G894 Chronic pain syndrome: Secondary | ICD-10-CM | POA: Diagnosis present

## 2022-12-03 DIAGNOSIS — G4733 Obstructive sleep apnea (adult) (pediatric): Secondary | ICD-10-CM | POA: Diagnosis present

## 2022-12-03 DIAGNOSIS — R109 Unspecified abdominal pain: Secondary | ICD-10-CM | POA: Diagnosis present

## 2022-12-03 DIAGNOSIS — R140 Abdominal distension (gaseous): Secondary | ICD-10-CM | POA: Diagnosis present

## 2022-12-03 DIAGNOSIS — L03116 Cellulitis of left lower limb: Secondary | ICD-10-CM | POA: Diagnosis not present

## 2022-12-03 DIAGNOSIS — F418 Other specified anxiety disorders: Secondary | ICD-10-CM | POA: Diagnosis present

## 2022-12-03 DIAGNOSIS — R531 Weakness: Secondary | ICD-10-CM | POA: Diagnosis not present

## 2022-12-03 DIAGNOSIS — Z6841 Body Mass Index (BMI) 40.0 and over, adult: Secondary | ICD-10-CM

## 2022-12-03 DIAGNOSIS — G43909 Migraine, unspecified, not intractable, without status migrainosus: Secondary | ICD-10-CM | POA: Diagnosis present

## 2022-12-03 DIAGNOSIS — Z79899 Other long term (current) drug therapy: Secondary | ICD-10-CM

## 2022-12-03 DIAGNOSIS — R079 Chest pain, unspecified: Secondary | ICD-10-CM | POA: Diagnosis not present

## 2022-12-03 DIAGNOSIS — F17210 Nicotine dependence, cigarettes, uncomplicated: Secondary | ICD-10-CM | POA: Diagnosis present

## 2022-12-03 DIAGNOSIS — I152 Hypertension secondary to endocrine disorders: Secondary | ICD-10-CM | POA: Diagnosis present

## 2022-12-03 DIAGNOSIS — K5901 Slow transit constipation: Secondary | ICD-10-CM | POA: Diagnosis not present

## 2022-12-03 DIAGNOSIS — R0602 Shortness of breath: Secondary | ICD-10-CM | POA: Diagnosis not present

## 2022-12-03 DIAGNOSIS — E8770 Fluid overload, unspecified: Secondary | ICD-10-CM | POA: Diagnosis not present

## 2022-12-03 DIAGNOSIS — Z7951 Long term (current) use of inhaled steroids: Secondary | ICD-10-CM

## 2022-12-03 DIAGNOSIS — R188 Other ascites: Secondary | ICD-10-CM | POA: Diagnosis not present

## 2022-12-03 DIAGNOSIS — J9621 Acute and chronic respiratory failure with hypoxia: Principal | ICD-10-CM | POA: Diagnosis present

## 2022-12-03 DIAGNOSIS — I959 Hypotension, unspecified: Secondary | ICD-10-CM | POA: Diagnosis not present

## 2022-12-03 DIAGNOSIS — Z7984 Long term (current) use of oral hypoglycemic drugs: Secondary | ICD-10-CM

## 2022-12-03 DIAGNOSIS — E669 Obesity, unspecified: Secondary | ICD-10-CM | POA: Diagnosis present

## 2022-12-03 DIAGNOSIS — Z1152 Encounter for screening for COVID-19: Secondary | ICD-10-CM | POA: Diagnosis not present

## 2022-12-03 DIAGNOSIS — E7800 Pure hypercholesterolemia, unspecified: Secondary | ICD-10-CM | POA: Diagnosis not present

## 2022-12-03 DIAGNOSIS — Z811 Family history of alcohol abuse and dependence: Secondary | ICD-10-CM

## 2022-12-03 DIAGNOSIS — I2510 Atherosclerotic heart disease of native coronary artery without angina pectoris: Secondary | ICD-10-CM | POA: Diagnosis present

## 2022-12-03 DIAGNOSIS — F1190 Opioid use, unspecified, uncomplicated: Secondary | ICD-10-CM | POA: Diagnosis present

## 2022-12-03 DIAGNOSIS — G40909 Epilepsy, unspecified, not intractable, without status epilepticus: Secondary | ICD-10-CM | POA: Diagnosis present

## 2022-12-03 DIAGNOSIS — Z79891 Long term (current) use of opiate analgesic: Secondary | ICD-10-CM

## 2022-12-03 DIAGNOSIS — H04123 Dry eye syndrome of bilateral lacrimal glands: Secondary | ICD-10-CM | POA: Diagnosis present

## 2022-12-03 DIAGNOSIS — Z881 Allergy status to other antibiotic agents status: Secondary | ICD-10-CM

## 2022-12-03 DIAGNOSIS — J4489 Other specified chronic obstructive pulmonary disease: Secondary | ICD-10-CM | POA: Diagnosis not present

## 2022-12-03 DIAGNOSIS — M545 Low back pain, unspecified: Secondary | ICD-10-CM | POA: Diagnosis present

## 2022-12-03 DIAGNOSIS — F32A Depression, unspecified: Secondary | ICD-10-CM | POA: Diagnosis present

## 2022-12-03 DIAGNOSIS — Z888 Allergy status to other drugs, medicaments and biological substances status: Secondary | ICD-10-CM

## 2022-12-03 DIAGNOSIS — Z9981 Dependence on supplemental oxygen: Secondary | ICD-10-CM

## 2022-12-03 DIAGNOSIS — Z803 Family history of malignant neoplasm of breast: Secondary | ICD-10-CM

## 2022-12-03 DIAGNOSIS — E8809 Other disorders of plasma-protein metabolism, not elsewhere classified: Secondary | ICD-10-CM | POA: Diagnosis not present

## 2022-12-03 DIAGNOSIS — K760 Fatty (change of) liver, not elsewhere classified: Secondary | ICD-10-CM | POA: Diagnosis present

## 2022-12-03 DIAGNOSIS — Z882 Allergy status to sulfonamides status: Secondary | ICD-10-CM

## 2022-12-03 DIAGNOSIS — Z8249 Family history of ischemic heart disease and other diseases of the circulatory system: Secondary | ICD-10-CM

## 2022-12-03 DIAGNOSIS — E1169 Type 2 diabetes mellitus with other specified complication: Secondary | ICD-10-CM | POA: Diagnosis present

## 2022-12-03 DIAGNOSIS — J449 Chronic obstructive pulmonary disease, unspecified: Secondary | ICD-10-CM | POA: Diagnosis present

## 2022-12-03 DIAGNOSIS — Z823 Family history of stroke: Secondary | ICD-10-CM

## 2022-12-03 DIAGNOSIS — K219 Gastro-esophageal reflux disease without esophagitis: Secondary | ICD-10-CM | POA: Diagnosis present

## 2022-12-03 DIAGNOSIS — J9611 Chronic respiratory failure with hypoxia: Secondary | ICD-10-CM | POA: Diagnosis present

## 2022-12-03 DIAGNOSIS — F419 Anxiety disorder, unspecified: Secondary | ICD-10-CM | POA: Diagnosis present

## 2022-12-03 LAB — BASIC METABOLIC PANEL
Anion gap: 9 (ref 5–15)
BUN: 6 mg/dL — ABNORMAL LOW (ref 8–23)
CO2: 30 mmol/L (ref 22–32)
Calcium: 9.3 mg/dL (ref 8.9–10.3)
Chloride: 93 mmol/L — ABNORMAL LOW (ref 98–111)
Creatinine, Ser: 0.88 mg/dL (ref 0.44–1.00)
GFR, Estimated: >60 mL/min (ref >60)
Glucose, Bld: 89 mg/dL (ref 70–99)
Potassium: 3.7 mmol/L (ref 3.5–5.1)
Sodium: 132 mmol/L — ABNORMAL LOW (ref 135–145)

## 2022-12-03 LAB — CBC WITH DIFFERENTIAL/PLATELET
Abs Immature Granulocytes: 0.01 10*3/uL (ref 0.00–0.07)
Basophils Absolute: 0 10*3/uL (ref 0.0–0.1)
Basophils Relative: 1 %
Eosinophils Absolute: 0.3 10*3/uL (ref 0.0–0.5)
Eosinophils Relative: 5 %
HCT: 30.7 % — ABNORMAL LOW (ref 36.0–46.0)
Hemoglobin: 9.4 g/dL — ABNORMAL LOW (ref 12.0–15.0)
Immature Granulocytes: 0 %
Lymphocytes Relative: 28 %
Lymphs Abs: 1.8 10*3/uL (ref 0.7–4.0)
MCH: 25.1 pg — ABNORMAL LOW (ref 26.0–34.0)
MCHC: 30.6 g/dL (ref 30.0–36.0)
MCV: 81.9 fL (ref 80.0–100.0)
Monocytes Absolute: 0.4 10*3/uL (ref 0.1–1.0)
Monocytes Relative: 7 %
Neutro Abs: 3.8 10*3/uL (ref 1.7–7.7)
Neutrophils Relative %: 59 %
Platelets: 156 10*3/uL (ref 150–400)
RBC: 3.75 MIL/uL — ABNORMAL LOW (ref 3.87–5.11)
RDW: 16.7 % — ABNORMAL HIGH (ref 11.5–15.5)
WBC: 6.3 10*3/uL (ref 4.0–10.5)
nRBC: 0 % (ref 0.0–0.2)

## 2022-12-03 LAB — TROPONIN I (HIGH SENSITIVITY)
Troponin I (High Sensitivity): 6 ng/L (ref <18)
Troponin I (High Sensitivity): 7 ng/L (ref <18)

## 2022-12-03 MED ORDER — FUROSEMIDE 10 MG/ML IJ SOLN
40.0000 mg | Freq: Once | INTRAMUSCULAR | Status: AC
  Administered 2022-12-03: 40 mg via INTRAVENOUS
  Filled 2022-12-03: qty 4

## 2022-12-03 MED ORDER — MORPHINE SULFATE (PF) 4 MG/ML IV SOLN
4.0000 mg | Freq: Once | INTRAVENOUS | Status: AC
  Administered 2022-12-03: 4 mg via INTRAVENOUS
  Filled 2022-12-03: qty 1

## 2022-12-03 MED ORDER — ONDANSETRON HCL 4 MG/2ML IJ SOLN
4.0000 mg | Freq: Once | INTRAMUSCULAR | Status: AC
  Administered 2022-12-03: 4 mg via INTRAVENOUS
  Filled 2022-12-03: qty 2

## 2022-12-03 MED ORDER — ALBUTEROL SULFATE HFA 108 (90 BASE) MCG/ACT IN AERS
2.0000 | INHALATION_SPRAY | RESPIRATORY_TRACT | Status: DC | PRN
  Filled 2022-12-03: qty 6.7

## 2022-12-03 NOTE — ED Notes (Signed)
Pt SpO2 on initial vitals sign were 95% with a good pleth. Spo2 sats now read 83% RA. Pt placed on 3L [place] and oxygen sats now read 97% w/ good waveform. Triage RN and provider made aware.

## 2022-12-03 NOTE — ED Triage Notes (Signed)
Patient reported to have 3 day hx of not feeling well with n/v.  She had 3 episodes of vomitting on yesterday.  She states her oxygen saturation has been low at times.  She reports oxygen sat of 82% on room air at home.  Patient also reports new swelling in the left lower leg that is red and painful from the knee to her foot.  No trauma.  Patient has hx of sleep apnea with need to add oxygen in the past month.  She states she may have had a fever in the past few days.  No emesis today but she continues to have nausea and unable to eat but she is tolerating fluids.

## 2022-12-03 NOTE — ED Provider Triage Note (Signed)
Emergency Medicine Provider Triage Evaluation Note  [name] , a 62 y.o. female  was evaluated in triage.  Pt complains of 3 days of nausea, vomiting, shortness of breath, and severe back pain.  She reports her SPO2 has been low at home with lowest of 82% on RA.  She has home oxygen that she wears when needed.  She has not had emesis today, but has nausea and decreased appetite.  She also has swelling in both legs despite taking diuretics.  Hx significant for CHF, COPD, CAD.   Review of Systems  Positive: As above Negative: As above  Physical Exam  BP 118/63 (BP Location: Right Arm)   Pulse 86   Temp 98.1 F (36.7 C) (Oral)   Resp 20   Ht 5' (1.524 m)   Wt 102.1 kg   SpO2 (!) 87%   BMI 43.94 kg/m  Gen:   Awake, no distress   Resp:  Normal effort, on oxygen, diminished breath sounds MSK:   Moves extremities without difficulty  Other:    Medical Decision Making  Medically screening exam initiated at 8:55 PM.  Appropriate orders placed.  [name] was informed that the remainder of the evaluation will be completed by another provider, this initial triage assessment does not replace that evaluation, and the importance of remaining in the ED until their evaluation is complete.     [name], [place] 12/03/22 2104

## 2022-12-04 ENCOUNTER — Inpatient Hospital Stay (HOSPITAL_COMMUNITY): Payer: 59

## 2022-12-04 DIAGNOSIS — R188 Other ascites: Secondary | ICD-10-CM | POA: Diagnosis not present

## 2022-12-04 DIAGNOSIS — E871 Hypo-osmolality and hyponatremia: Secondary | ICD-10-CM | POA: Diagnosis present

## 2022-12-04 DIAGNOSIS — J9621 Acute and chronic respiratory failure with hypoxia: Secondary | ICD-10-CM | POA: Diagnosis present

## 2022-12-04 DIAGNOSIS — Z1152 Encounter for screening for COVID-19: Secondary | ICD-10-CM | POA: Diagnosis not present

## 2022-12-04 DIAGNOSIS — E1169 Type 2 diabetes mellitus with other specified complication: Secondary | ICD-10-CM | POA: Diagnosis present

## 2022-12-04 DIAGNOSIS — I152 Hypertension secondary to endocrine disorders: Secondary | ICD-10-CM | POA: Diagnosis present

## 2022-12-04 DIAGNOSIS — D638 Anemia in other chronic diseases classified elsewhere: Secondary | ICD-10-CM | POA: Diagnosis present

## 2022-12-04 DIAGNOSIS — M7989 Other specified soft tissue disorders: Secondary | ICD-10-CM | POA: Diagnosis not present

## 2022-12-04 DIAGNOSIS — L03116 Cellulitis of left lower limb: Secondary | ICD-10-CM | POA: Diagnosis present

## 2022-12-04 DIAGNOSIS — F32A Depression, unspecified: Secondary | ICD-10-CM | POA: Diagnosis present

## 2022-12-04 DIAGNOSIS — F419 Anxiety disorder, unspecified: Secondary | ICD-10-CM | POA: Diagnosis present

## 2022-12-04 DIAGNOSIS — F17210 Nicotine dependence, cigarettes, uncomplicated: Secondary | ICD-10-CM | POA: Diagnosis present

## 2022-12-04 DIAGNOSIS — K7581 Nonalcoholic steatohepatitis (NASH): Secondary | ICD-10-CM | POA: Diagnosis present

## 2022-12-04 DIAGNOSIS — E7800 Pure hypercholesterolemia, unspecified: Secondary | ICD-10-CM | POA: Diagnosis present

## 2022-12-04 DIAGNOSIS — J9622 Acute and chronic respiratory failure with hypercapnia: Secondary | ICD-10-CM | POA: Diagnosis not present

## 2022-12-04 DIAGNOSIS — R140 Abdominal distension (gaseous): Secondary | ICD-10-CM | POA: Diagnosis not present

## 2022-12-04 DIAGNOSIS — Z79899 Other long term (current) drug therapy: Secondary | ICD-10-CM | POA: Diagnosis not present

## 2022-12-04 DIAGNOSIS — E8809 Other disorders of plasma-protein metabolism, not elsewhere classified: Secondary | ICD-10-CM | POA: Diagnosis present

## 2022-12-04 DIAGNOSIS — K7460 Unspecified cirrhosis of liver: Secondary | ICD-10-CM | POA: Diagnosis not present

## 2022-12-04 DIAGNOSIS — J4489 Other specified chronic obstructive pulmonary disease: Secondary | ICD-10-CM | POA: Diagnosis present

## 2022-12-04 DIAGNOSIS — R110 Nausea: Secondary | ICD-10-CM | POA: Diagnosis not present

## 2022-12-04 DIAGNOSIS — J9611 Chronic respiratory failure with hypoxia: Secondary | ICD-10-CM | POA: Diagnosis not present

## 2022-12-04 DIAGNOSIS — N281 Cyst of kidney, acquired: Secondary | ICD-10-CM | POA: Diagnosis not present

## 2022-12-04 DIAGNOSIS — R109 Unspecified abdominal pain: Secondary | ICD-10-CM | POA: Diagnosis not present

## 2022-12-04 DIAGNOSIS — I2510 Atherosclerotic heart disease of native coronary artery without angina pectoris: Secondary | ICD-10-CM | POA: Diagnosis not present

## 2022-12-04 DIAGNOSIS — M545 Low back pain, unspecified: Secondary | ICD-10-CM | POA: Diagnosis not present

## 2022-12-04 DIAGNOSIS — Z6841 Body Mass Index (BMI) 40.0 and over, adult: Secondary | ICD-10-CM | POA: Diagnosis not present

## 2022-12-04 DIAGNOSIS — G40909 Epilepsy, unspecified, not intractable, without status epilepticus: Secondary | ICD-10-CM | POA: Diagnosis present

## 2022-12-04 DIAGNOSIS — G894 Chronic pain syndrome: Secondary | ICD-10-CM | POA: Diagnosis present

## 2022-12-04 DIAGNOSIS — K5901 Slow transit constipation: Secondary | ICD-10-CM | POA: Diagnosis present

## 2022-12-04 DIAGNOSIS — M47816 Spondylosis without myelopathy or radiculopathy, lumbar region: Secondary | ICD-10-CM | POA: Diagnosis not present

## 2022-12-04 DIAGNOSIS — I959 Hypotension, unspecified: Secondary | ICD-10-CM | POA: Diagnosis present

## 2022-12-04 LAB — BASIC METABOLIC PANEL
Anion gap: 12 (ref 5–15)
BUN: 6 mg/dL — ABNORMAL LOW (ref 8–23)
CO2: 32 mmol/L (ref 22–32)
Calcium: 9 mg/dL (ref 8.9–10.3)
Chloride: 90 mmol/L — ABNORMAL LOW (ref 98–111)
Creatinine, Ser: 0.83 mg/dL (ref 0.44–1.00)
GFR, Estimated: >60 mL/min (ref >60)
Glucose, Bld: 100 mg/dL — ABNORMAL HIGH (ref 70–99)
Potassium: 3.6 mmol/L (ref 3.5–5.1)
Sodium: 134 mmol/L — ABNORMAL LOW (ref 135–145)

## 2022-12-04 LAB — HEPATIC FUNCTION PANEL
ALT: 29 U/L (ref 0–44)
AST: 55 U/L — ABNORMAL HIGH (ref 15–41)
Albumin: 3.3 g/dL — ABNORMAL LOW (ref 3.5–5.0)
Alkaline Phosphatase: 88 U/L (ref 38–126)
Bilirubin, Direct: <0.1 mg/dL (ref 0.0–0.2)
Total Bilirubin: 0.7 mg/dL (ref 0.3–1.2)
Total Protein: 8.5 g/dL — ABNORMAL HIGH (ref 6.5–8.1)

## 2022-12-04 LAB — PROTIME-INR
INR: 1.3 — ABNORMAL HIGH (ref 0.8–1.2)
Prothrombin Time: 15.6 seconds — ABNORMAL HIGH (ref 11.4–15.2)

## 2022-12-04 LAB — CBC
HCT: 32 % — ABNORMAL LOW (ref 36.0–46.0)
Hemoglobin: 9.9 g/dL — ABNORMAL LOW (ref 12.0–15.0)
MCH: 25.1 pg — ABNORMAL LOW (ref 26.0–34.0)
MCHC: 30.9 g/dL (ref 30.0–36.0)
MCV: 81.2 fL (ref 80.0–100.0)
Platelets: 167 10*3/uL (ref 150–400)
RBC: 3.94 MIL/uL (ref 3.87–5.11)
RDW: 16.8 % — ABNORMAL HIGH (ref 11.5–15.5)
WBC: 6.1 10*3/uL (ref 4.0–10.5)
nRBC: 0 % (ref 0.0–0.2)

## 2022-12-04 LAB — CREATININE, SERUM
Creatinine, Ser: 0.88 mg/dL (ref 0.44–1.00)
GFR, Estimated: >60 mL/min (ref >60)

## 2022-12-04 LAB — BRAIN NATRIURETIC PEPTIDE: B Natriuretic Peptide: 74.2 pg/mL (ref 0.0–100.0)

## 2022-12-04 MED ORDER — ESTRADIOL 0.5 MG PO TABS
2.0000 mg | ORAL_TABLET | Freq: Every day | ORAL | Status: DC
  Administered 2022-12-04 – 2022-12-07 (×4): 2 mg via ORAL
  Filled 2022-12-04 (×4): qty 4

## 2022-12-04 MED ORDER — CYCLOBENZAPRINE HCL 10 MG PO TABS
10.0000 mg | ORAL_TABLET | Freq: Three times a day (TID) | ORAL | Status: DC
  Administered 2022-12-04 – 2022-12-06 (×6): 10 mg via ORAL
  Filled 2022-12-04 (×6): qty 1

## 2022-12-04 MED ORDER — ALBUTEROL SULFATE (2.5 MG/3ML) 0.083% IN NEBU: 2.5000 mg | INHALATION_SOLUTION | Freq: Four times a day (QID) | RESPIRATORY_TRACT | Status: DC | PRN

## 2022-12-04 MED ORDER — HYDROXYZINE HCL 25 MG PO TABS: 25.0000 mg | ORAL_TABLET | Freq: Three times a day (TID) | ORAL | Status: DC | PRN

## 2022-12-04 MED ORDER — OXYCODONE HCL 5 MG PO TABS: 15.0000 mg | ORAL_TABLET | ORAL | Status: DC | PRN

## 2022-12-04 MED ORDER — MOMETASONE FURO-FORMOTEROL FUM 200-5 MCG/ACT IN AERO
2.0000 | INHALATION_SPRAY | Freq: Two times a day (BID) | RESPIRATORY_TRACT | Status: DC
  Administered 2022-12-04 – 2022-12-06 (×4): 2 via RESPIRATORY_TRACT
  Filled 2022-12-04: qty 8.8

## 2022-12-04 MED ORDER — LORATADINE 10 MG PO TABS
5.0000 mg | ORAL_TABLET | Freq: Every evening | ORAL | Status: DC
  Administered 2022-12-04 – 2022-12-06 (×3): 5 mg via ORAL
  Filled 2022-12-04 (×3): qty 1

## 2022-12-04 MED ORDER — DOXYCYCLINE HYCLATE 100 MG PO TABS
100.0000 mg | ORAL_TABLET | Freq: Two times a day (BID) | ORAL | Status: DC
  Administered 2022-12-04 – 2022-12-07 (×7): 100 mg via ORAL
  Filled 2022-12-04 (×7): qty 1

## 2022-12-04 MED ORDER — LACTULOSE 10 GM/15ML PO SOLN
10.0000 g | Freq: Two times a day (BID) | ORAL | Status: DC
  Administered 2022-12-04 – 2022-12-07 (×7): 10 g via ORAL
  Filled 2022-12-04 (×7): qty 15

## 2022-12-04 MED ORDER — OXYCODONE HCL 5 MG PO TABS
15.0000 mg | ORAL_TABLET | ORAL | Status: DC | PRN
  Administered 2022-12-04 – 2022-12-07 (×20): 15 mg via ORAL
  Filled 2022-12-04 (×20): qty 3

## 2022-12-04 MED ORDER — OXYCODONE HCL 5 MG PO TABS
15.0000 mg | ORAL_TABLET | ORAL | Status: AC | PRN
  Administered 2022-12-04 (×2): 15 mg via ORAL
  Filled 2022-12-04 (×2): qty 3

## 2022-12-04 MED ORDER — SENNOSIDES-DOCUSATE SODIUM 8.6-50 MG PO TABS
2.0000 | ORAL_TABLET | Freq: Two times a day (BID) | ORAL | Status: DC
  Administered 2022-12-04 – 2022-12-07 (×5): 2 via ORAL
  Filled 2022-12-04 (×5): qty 2

## 2022-12-04 MED ORDER — CIPROFLOXACIN HCL 500 MG PO TABS
500.0000 mg | ORAL_TABLET | Freq: Every day | ORAL | Status: DC
  Administered 2022-12-04 – 2022-12-07 (×4): 500 mg via ORAL
  Filled 2022-12-04 (×4): qty 1

## 2022-12-04 MED ORDER — ALBUMIN HUMAN 25 % IV SOLN
25.0000 g | Freq: Four times a day (QID) | INTRAVENOUS | Status: AC
  Administered 2022-12-04: 12.5 g via INTRAVENOUS
  Administered 2022-12-04: 25 g via INTRAVENOUS
  Filled 2022-12-04 (×2): qty 100

## 2022-12-04 MED ORDER — ALBUTEROL SULFATE (2.5 MG/3ML) 0.083% IN NEBU: 2.5000 mg | INHALATION_SOLUTION | RESPIRATORY_TRACT | Status: DC | PRN

## 2022-12-04 MED ORDER — HYDROMORPHONE HCL 1 MG/ML IJ SOLN
0.5000 mg | Freq: Once | INTRAMUSCULAR | Status: AC | PRN
  Administered 2022-12-05: 0.5 mg via INTRAVENOUS
  Filled 2022-12-04: qty 0.5

## 2022-12-04 MED ORDER — NALOXONE HCL 0.4 MG/ML IJ SOLN: 0.4000 mg | INTRAMUSCULAR | Status: DC | PRN

## 2022-12-04 MED ORDER — LIDOCAINE HCL 1 % IJ SOLN
INTRAMUSCULAR | Status: AC
  Filled 2022-12-04: qty 20

## 2022-12-04 MED ORDER — PANTOPRAZOLE SODIUM 40 MG PO TBEC
40.0000 mg | DELAYED_RELEASE_TABLET | Freq: Two times a day (BID) | ORAL | Status: DC
  Administered 2022-12-04 – 2022-12-07 (×6): 40 mg via ORAL
  Filled 2022-12-04 (×6): qty 1

## 2022-12-04 MED ORDER — BUDESONIDE 0.5 MG/2ML IN SUSP: 1.0000 mg | Freq: Every day | RESPIRATORY_TRACT | Status: DC

## 2022-12-04 MED ORDER — FUROSEMIDE 10 MG/ML IJ SOLN
40.0000 mg | Freq: Every day | INTRAMUSCULAR | Status: DC
  Administered 2022-12-04: 40 mg via INTRAVENOUS
  Filled 2022-12-04: qty 4

## 2022-12-04 MED ORDER — DICLOFENAC SODIUM 1 % EX GEL
2.0000 g | Freq: Four times a day (QID) | CUTANEOUS | Status: DC
  Administered 2022-12-05 – 2022-12-07 (×6): 2 g via TOPICAL
  Filled 2022-12-04: qty 100

## 2022-12-04 MED ORDER — IPRATROPIUM-ALBUTEROL 0.5-2.5 (3) MG/3ML IN SOLN
3.0000 mL | Freq: Four times a day (QID) | RESPIRATORY_TRACT | Status: DC
  Administered 2022-12-04 – 2022-12-06 (×7): 3 mL via RESPIRATORY_TRACT
  Filled 2022-12-04 (×12): qty 3

## 2022-12-04 MED ORDER — FUROSEMIDE 10 MG/ML IJ SOLN: 40.0000 mg | Freq: Every day | INTRAMUSCULAR | Status: DC

## 2022-12-04 MED ORDER — LIDOCAINE 5 % EX PTCH
1.0000 | MEDICATED_PATCH | CUTANEOUS | Status: DC
  Administered 2022-12-04 – 2022-12-07 (×4): 1 via TRANSDERMAL
  Filled 2022-12-04 (×4): qty 1

## 2022-12-04 MED ORDER — TOPIRAMATE 25 MG PO TABS
50.0000 mg | ORAL_TABLET | Freq: Every day | ORAL | Status: DC
  Administered 2022-12-04 – 2022-12-07 (×4): 50 mg via ORAL
  Filled 2022-12-04 (×4): qty 2

## 2022-12-04 MED ORDER — IPRATROPIUM-ALBUTEROL 0.5-2.5 (3) MG/3ML IN SOLN: 3.0000 mL | Freq: Four times a day (QID) | RESPIRATORY_TRACT | Status: DC | PRN

## 2022-12-04 MED ORDER — ENOXAPARIN SODIUM 40 MG/0.4ML IJ SOSY
40.0000 mg | PREFILLED_SYRINGE | INTRAMUSCULAR | Status: DC
  Administered 2022-12-04 – 2022-12-07 (×4): 40 mg via SUBCUTANEOUS
  Filled 2022-12-04 (×4): qty 0.4

## 2022-12-04 MED ORDER — PANTOPRAZOLE SODIUM 40 MG PO TBEC
40.0000 mg | DELAYED_RELEASE_TABLET | Freq: Two times a day (BID) | ORAL | Status: DC
  Administered 2022-12-04: 40 mg via ORAL
  Filled 2022-12-04: qty 1

## 2022-12-04 MED ORDER — ALPRAZOLAM 0.5 MG PO TABS
1.0000 mg | ORAL_TABLET | Freq: Three times a day (TID) | ORAL | Status: DC | PRN
  Administered 2022-12-04 – 2022-12-06 (×3): 1 mg via ORAL
  Filled 2022-12-04 (×3): qty 2

## 2022-12-04 NOTE — Plan of Care (Signed)

## 2022-12-04 NOTE — Progress Notes (Signed)
Patient received from ED via stretcher. AO x4. Berating even and unlabored in 3l o2 via [place]. CHG bath completed. Connected to tele and CCMD notified. Oriented pt to room and call bell system. Call bell within reach. Plan of care continues.

## 2022-12-04 NOTE — ED Notes (Signed)
ED TO INPATIENT HANDOFF REPORT  ED Nurse Name and Phone #: Dewanda Fennema [phone]  S Name/Age/Gender [name] 62 y.o. female Room/Bed: 026C/026C  Code Status   Code Status: Prior  Home/SNF/Other Home Patient oriented to: self Is this baseline? Yes   Triage Complete: Triage complete  Chief Complaint Acute on chronic hypoxic respiratory failure ([place]) [J96.21]  Triage Note Patient reported to have 3 day hx of not feeling well with n/v.  She had 3 episodes of vomitting on yesterday.  She states her oxygen saturation has been low at times.  She reports oxygen sat of 82% on room air at home.  Patient also reports new swelling in the left lower leg that is red and painful from the knee to her foot.  No trauma.  Patient has hx of sleep apnea with need to add oxygen in the past month.  She states she may have had a fever in the past few days.  No emesis today but she continues to have nausea and unable to eat but she is tolerating fluids.     Allergies Allergies  Allergen Reactions   Spironolactone Nausea Only    nausea   Gabapentin Other (See Comments)    sleepwalking   Sulfamethoxazole-Trimethoprim Hives    Hives all over body   Amoxicillin Nausea And Vomiting and Other (See Comments)    tolerated augmentin in the past (03/2013) without issue. Did it involve swelling of the face/tongue/throat, SOB, or low BP? No Did it involve sudden or severe rash/hives, skin peeling, or any reaction on the inside of your mouth or nose? No Did you need to seek medical attention at a hospital or doctor's office? No When did it last happen? unk       If all above answers are "NO", may proceed with cephalosporin use.    Clindamycin/Lincomycin Nausea And Vomiting   Doxycycline Nausea And Vomiting   Treximet [Sumatriptan-Naproxen Sodium] Nausea And Vomiting   Trimethoprim Hives   Bactrim Hives and Rash    Level of Care/Admitting Diagnosis ED Disposition     ED Disposition  Admit   Condition  --    Comment  Hospital Area: [facility] [[id number]]  Level of Care: Progressive [102]  Admit to Progressive based on following criteria: MULTISYSTEM THREATS such as stable sepsis, metabolic/electrolyte imbalance with or without encephalopathy that is responding to early treatment.  May admit patient to [facility] or [facility] if equivalent level of care is available:: No  Covid Evaluation: Asymptomatic - no recent exposure (last 10 days) testing not required  Diagnosis: Acute on chronic hypoxic respiratory failure [facility]) [[id number]]  Admitting Physician: Kayleen Memos [[id number]]  Attending Physician: Kayleen Memos [[id number]]  Certification:: I certify this patient will need inpatient services for at least 2 midnights  Estimated Length of Stay: 2          B Medical/Surgery History Past Medical History:  Diagnosis Date   Abdominal pain 01/12/2019   Abdominal pain, generalized 07/10/2014   Acute on chronic respiratory failure with hypoxia ([place]) 10/18/2018   Anxiety    Arthritis    Ascites 05/01/2019   NALD   Asthma    CAD (coronary artery disease)    cath 2010 with 60-70% left Cx and normal LVF   Chronic low back pain with left-sided sciatica 10/15/2015   Chronic pain    Chronic prescription benzodiazepine use 05/01/2019   Chronic, continuous use of opioids 05/01/2019   Cirrhosis of liver not  due to alcohol ([place]) 05/01/2019   Cirrhosis of liver: per CT 07/11/2014   Constipation by delayed colonic transit 12/09/2014   COPD (chronic obstructive pulmonary disease) ([name])    COPD exacerbation ([place]) 10/12/2016   Dehiscence of surgical wound left knee 03/16/2013   Depression    Depression with anxiety 01/11/2019   Deviated septum    Diabetes mellitus without complication ([place])    type 2   Generalized abdominal pain 12/09/2014   GERD (gastroesophageal reflux disease)    Headache(784.0)    migraines   Hepatic cirrhosis ([place]) 06/2014   HLD (hyperlipidemia) 01/11/2019    Hypercholesterolemia    Hypertension    Hypertension associated with diabetes ([place])    Ileus ([place]) 07/11/2014   Influenza A 10/2018   Lobar pneumonia ([place])    Microcytic anemia 01/11/2019   Migraine headache 12/09/2014   Morbid obesity ([place]) 03/09/2013   Obesity    OSA (obstructive sleep apnea) 01/11/2019   Pars defect with spondylolisthesis 05/01/2019   Perforated bowel ([place])    PONV (postoperative nausea and vomiting)    Respiratory failure, acute-on-chronic ([place]) 12/11/2010   S/P left PF UKR 03/07/2013   Seizures ([place])    as a little girl 62 years old   Shortness of breath    Sleep apnea    dont wear bipap . lost weight   Spondylolisthesis, grade 2    Tobacco abuse    Type 2 diabetes mellitus with obesity ([place]) 01/11/2019   Past Surgical History:  Procedure Laterality Date   ABDOMINAL EXPLORATION SURGERY     primary repair of colon perforation from [facility]   ABDOMINAL HYSTERECTOMY     total   BACK SURGERY     lumbar   COLONOSCOPY WITH PROPOFOL N/A 10/04/2019   Procedure: COLONOSCOPY WITH PROPOFOL;  Surgeon: Wilford Corner, MD;  Location: [facility];  Service: Endoscopy;  Laterality: N/A;   ESOPHAGOGASTRODUODENOSCOPY (EGD) WITH PROPOFOL N/A 10/29/2016   Procedure: ESOPHAGOGASTRODUODENOSCOPY (EGD) WITH PROPOFOL;  Surgeon: Laurence Spates, MD;  Location: [facility];  Service: Endoscopy;  Laterality: N/A;   ESOPHAGOGASTRODUODENOSCOPY (EGD) WITH PROPOFOL N/A 10/04/2019   Procedure: ESOPHAGOGASTRODUODENOSCOPY (EGD) WITH PROPOFOL;  Surgeon: Wilford Corner, MD;  Location: [facility];  Service: Endoscopy;  Laterality: N/A;   flank ablation     IR PARACENTESIS  01/12/2019   IR PARACENTESIS  05/02/2019   IR PARACENTESIS  08/03/2019   IR PARACENTESIS  08/12/2022   IR PARACENTESIS  08/14/2022   IRRIGATION AND DEBRIDEMENT KNEE Left 03/14/2013   Procedure: IRRIGATION AND DEBRIDEMENT  and Closure of wound left KNEE;  Surgeon: Mauri Pole, MD;  Location: [facility];  Service: Orthopedics;   Laterality: Left;   PATELLA-FEMORAL ARTHROPLASTY Left 03/07/2013   Procedure: LEFT PATELLA-FEMORAL ARTHROPLASTY;  Surgeon: Mauri Pole, MD;  Location: [facility];  Service: Orthopedics;  Laterality: Left;   SAVORY DILATION N/A 10/29/2016   Procedure: SAVORY DILATION;  Surgeon: Laurence Spates, MD;  Location: [facility];  Service: Endoscopy;  Laterality: N/A;     A IV Location/Drains/Wounds Patient Lines/Drains/Airways Status     Active Line/Drains/Airways     Name Placement date Placement time Site Days   Peripheral IV 12/03/22 20 G Right Antecubital 12/03/22  2210  Antecubital  1   Wound / Incision (Open or Dehisced) 10/13/16 Other (Comment) Arm Right skin tear 10/13/16  2100  Arm  2243            Intake/Output Last 24 hours No intake or output data in the 24 hours  ending 12/04/22 0025  Labs/Imaging Results for orders placed or performed during the hospital encounter of 12/03/22 (from the past 48 hour(s))  Basic metabolic panel     Status: Abnormal   Collection Time: 12/03/22  8:53 PM  Result Value Ref Range   Sodium 132 (L) 135 - 145 mmol/L   Potassium 3.7 3.5 - 5.1 mmol/L   Chloride 93 (L) 98 - 111 mmol/L   CO2 30 22 - 32 mmol/L   Glucose, Bld 89 70 - 99 mg/dL    Comment: Glucose reference range applies only to samples taken after fasting for at least 8 hours.   BUN 6 (L) 8 - 23 mg/dL   Creatinine, Ser 0.88 0.44 - 1.00 mg/dL   Calcium 9.3 8.9 - 10.3 mg/dL   GFR, Estimated >60 >60 mL/min    Comment: (NOTE) Calculated using the CKD-EPI Creatinine Equation (2021)    Anion gap 9 5 - 15    Comment: Performed at [facility] [address] [place], [place] [address]  Troponin I (High Sensitivity)     Status: None   Collection Time: 12/03/22  8:53 PM  Result Value Ref Range   Troponin I (High Sensitivity) 6 <18 ng/L    Comment: (NOTE) Elevated high sensitivity troponin I (hsTnI) values and significant  changes across serial measurements may suggest ACS but many  other  chronic and acute conditions are known to elevate hsTnI results.  Refer to the "Links" section for chest pain algorithms and additional  guidance. Performed at [facility], [place] [address], [place], [place] [address]   CBC with Differential     Status: Abnormal   Collection Time: 12/03/22  8:53 PM  Result Value Ref Range   WBC 6.3 4.0 - 10.5 K/uL   RBC 3.75 (L) 3.87 - 5.11 MIL/uL   Hemoglobin 9.4 (L) 12.0 - 15.0 g/dL   HCT 30.7 (L) 36.0 - 46.0 %   MCV 81.9 80.0 - 100.0 fL   MCH 25.1 (L) 26.0 - 34.0 pg   MCHC 30.6 30.0 - 36.0 g/dL   RDW 16.7 (H) 11.5 - 15.5 %   Platelets 156 150 - 400 K/uL   nRBC 0.0 0.0 - 0.2 %   Neutrophils Relative % 59 %   Neutro Abs 3.8 1.7 - 7.7 K/uL   Lymphocytes Relative 28 %   Lymphs Abs 1.8 0.7 - 4.0 K/uL   Monocytes Relative 7 %   Monocytes Absolute 0.4 0.1 - 1.0 K/uL   Eosinophils Relative 5 %   Eosinophils Absolute 0.3 0.0 - 0.5 K/uL   Basophils Relative 1 %   Basophils Absolute 0.0 0.0 - 0.1 K/uL   Immature Granulocytes 0 %   Abs Immature Granulocytes 0.01 0.00 - 0.07 K/uL    Comment: Performed at [facility], [address], [place], [place] [address]  Troponin I (High Sensitivity)     Status: None   Collection Time: 12/03/22 10:10 PM  Result Value Ref Range   Troponin I (High Sensitivity) 7 <18 ng/L    Comment: (NOTE) Elevated high sensitivity troponin I (hsTnI) values and significant  changes across serial measurements may suggest ACS but many other  chronic and acute conditions are known to elevate hsTnI results.  Refer to the "Links" section for chest pain algorithms and additional  guidance. Performed at [facility], [place] [address] [place],  [address]    DG Chest 2 View  Result Date: 12/03/2022 CLINICAL  DATA:  Shortness of breath, weakness and back pain. EXAM: CHEST - 2 VIEW COMPARISON:  AP Lat chest 11/07/2022 FINDINGS: The lungs are clear of infiltrates. Linear scarring or atelectasis is again  noted in the left lower lung field. The sulci are sharp. The mediastinum is normally outlined. There is mild cardiomegaly without evidence of CHF. There is thoracic kyphosis and spondylosis. No spinal compression fracture is seen. IMPRESSION: No evidence of acute chest disease. Mild cardiomegaly. Thoracic kyphosis and spondylosis. Stable chest. Electronically Signed   By: Telford Nab M.D.   On: 12/03/2022 21:17    Pending Labs Unresulted Labs (From admission, onward)     Start     Ordered   12/04/22 0009  Hepatic function panel  Once,   URGENT        12/04/22 0008   12/03/22 2149  Brain natriuretic peptide  Once,   URGENT        12/03/22 2148            Vitals/Pain Today's Vitals   12/03/22 2300 12/03/22 2330 12/03/22 2347 12/04/22 0000  BP: 117/84 133/88  (!) 108/56  Pulse: 79 84  82  Resp: 14 14  17   Temp:      TempSrc:      SpO2: 97% (!) 85%  100%  Weight:      Height:      PainSc:   2      Isolation Precautions No active isolations  Medications Medications  albuterol (VENTOLIN HFA) 108 (90 Base) MCG/ACT inhaler 2 puff (has no administration in time range)  morphine (PF) 4 MG/ML injection 4 mg (4 mg Intravenous Given 12/03/22 2216)  furosemide (LASIX) injection 40 mg (40 mg Intravenous Given 12/03/22 2218)  ondansetron (ZOFRAN) injection 4 mg (4 mg Intravenous Given 12/03/22 2216)    Mobility walks with person assist     Focused Assessments Pulmonary Assessment Handoff:  Lung sounds:   O2 Device: Nasal Cannula O2 Flow Rate (L/min): 3 L/min    R Recommendations: See Admitting Provider Note  Report given to:   Additional Notes: walks with a cane, on home 02, 3L [place] here. On purewick.

## 2022-12-04 NOTE — H&P (Addendum)
History and Physical  [name] [id number] DOB: 09-13-1961 DOA: 12/03/2022  Referring physician: Dr. Matilde Sprang, [place] PCP: Shirline Frees, MD  Outpatient Specialists: GI, ENT, cardiology. Patient coming from: Home.  Chief Complaint: Low oxygen level at home  HPI: [name] is a 61 y.o. female with medical history significant for [name] cirrhosis, SBP diagnosed 3 months ago on ciprofloxacin chronic hypoxic respiratory failure on 2 L nasal cannula nightly, coronary artery disease, seizure disorder, obesity, migraines, hypertension, hyperlipidemia, type 2 diabetes, morbid obesity, COPD, OSA on CPAP, who presented to [facility] ED due to complaints of worsening hypoxia at home having to use his oxygen supplementation more frequently, abdominal distention, pain which she states is chronic, and left lower extremity pain, erythema and edema.  States she has not been moving much.  No recent lengthy trips.  No recent falls or trauma.  Admits to intermittent chills for the past 2 days.  Also admits to vomiting 2 days ago.  Denies hematochezia, coffee-ground emesis, melena or hematochezia.  Denies having any recent confusion.  She takes lactulose daily and is having 1 bowel movements per day.  In the ED, volume overload on exam with abdominal distention, tenderness on palpation, bilateral lower extremity edema left greater than right.  The patient received 1 dose of IV Lasix 40 mg x 1 as well as IV opioid-based analgesics and IV antiemetics.  TRH, hospitalist service, was asked to admit.  ED Course: Tmax 98.1.  BP 98/81, pulse 78, respiration rate 78, O2 saturation 99% on 3 L.  Lab studies remarkable for serum sodium 132, chloride 93.  BNP 74.  WBC 9.4, close to baseline.  WBC 6.3,.  Platelet count 156.  Review of Systems: Review of systems as noted in the HPI. All other systems reviewed and are negative.   Past Medical History:  Diagnosis Date   Abdominal pain 01/12/2019   Abdominal pain, generalized  07/10/2014   Acute on chronic respiratory failure with hypoxia ([name]) 10/18/2018   Anxiety    Arthritis    Ascites 05/01/2019   NALD   Asthma    CAD (coronary artery disease)    cath 2010 with 60-70% left Cx and normal LVF   Chronic low back pain with left-sided sciatica 10/15/2015   Chronic pain    Chronic prescription benzodiazepine use 05/01/2019   Chronic, continuous use of opioids 05/01/2019   Cirrhosis of liver not due to alcohol ([place]) 05/01/2019   Cirrhosis of liver: per CT 07/11/2014   Constipation by delayed colonic transit 12/09/2014   COPD (chronic obstructive pulmonary disease) ([place])    COPD exacerbation ([place]) 10/12/2016   Dehiscence of surgical wound left knee 03/16/2013   Depression    Depression with anxiety 01/11/2019   Deviated septum    Diabetes mellitus without complication ([place])    type 2   Generalized abdominal pain 12/09/2014   GERD (gastroesophageal reflux disease)    Headache(784.0)    migraines   Hepatic cirrhosis ([place]) 06/2014   HLD (hyperlipidemia) 01/11/2019   Hypercholesterolemia    Hypertension    Hypertension associated with diabetes ([place])    Ileus ([place]) 07/11/2014   Influenza A 10/2018   Lobar pneumonia ([place])    Microcytic anemia 01/11/2019   Migraine headache 12/09/2014   Morbid obesity ([place]) 03/09/2013   Obesity    OSA (obstructive sleep apnea) 01/11/2019   Pars defect with spondylolisthesis 05/01/2019   Perforated bowel ([name])    PONV (postoperative nausea and vomiting)    Respiratory failure,  acute-on-chronic ([place]) 12/11/2010   S/P left PF UKR 03/07/2013   Seizures ([place])    as a little girl 62 years old   Shortness of breath    Sleep apnea    dont wear bipap . lost weight   Spondylolisthesis, grade 2    Tobacco abuse    Type 2 diabetes mellitus with obesity ([place]) 01/11/2019   Past Surgical History:  Procedure Laterality Date   ABDOMINAL EXPLORATION SURGERY     primary repair of colon perforation from [facility]   ABDOMINAL HYSTERECTOMY     total   BACK  SURGERY     lumbar   COLONOSCOPY WITH PROPOFOL N/A 10/04/2019   Procedure: COLONOSCOPY WITH PROPOFOL;  Surgeon: Wilford Corner, MD;  Location: [facility];  Service: Endoscopy;  Laterality: N/A;   ESOPHAGOGASTRODUODENOSCOPY (EGD) WITH PROPOFOL N/A 10/29/2016   Procedure: ESOPHAGOGASTRODUODENOSCOPY (EGD) WITH PROPOFOL;  Surgeon: Laurence Spates, MD;  Location: [facility];  Service: Endoscopy;  Laterality: N/A;   ESOPHAGOGASTRODUODENOSCOPY (EGD) WITH PROPOFOL N/A 10/04/2019   Procedure: ESOPHAGOGASTRODUODENOSCOPY (EGD) WITH PROPOFOL;  Surgeon: Wilford Corner, MD;  Location: [facility];  Service: Endoscopy;  Laterality: N/A;   flank ablation     IR PARACENTESIS  01/12/2019   IR PARACENTESIS  05/02/2019   IR PARACENTESIS  08/03/2019   IR PARACENTESIS  08/12/2022   IR PARACENTESIS  08/14/2022   IRRIGATION AND DEBRIDEMENT KNEE Left 03/14/2013   Procedure: IRRIGATION AND DEBRIDEMENT  and Closure of wound left KNEE;  Surgeon: Mauri Pole, MD;  Location: [facility];  Service: Orthopedics;  Laterality: Left;   PATELLA-FEMORAL ARTHROPLASTY Left 03/07/2013   Procedure: LEFT PATELLA-FEMORAL ARTHROPLASTY;  Surgeon: Mauri Pole, MD;  Location: [facility];  Service: Orthopedics;  Laterality: Left;   SAVORY DILATION N/A 10/29/2016   Procedure: SAVORY DILATION;  Surgeon: Laurence Spates, MD;  Location: [facility];  Service: Endoscopy;  Laterality: N/A;    Social History:  reports that she has been smoking cigarettes. She has been smoking an average of 1.5 packs per day. She has never used smokeless tobacco. She reports that she does not drink alcohol and does not use drugs.   Allergies  Allergen Reactions   Spironolactone Nausea Only    nausea   Gabapentin Other (See Comments)    sleepwalking   Sulfamethoxazole-Trimethoprim Hives    Hives all over body   Amoxicillin Nausea And Vomiting and Other (See Comments)    tolerated augmentin in the past (03/2013) without issue. Did it involve swelling of the  face/tongue/throat, SOB, or low BP? No Did it involve sudden or severe rash/hives, skin peeling, or any reaction on the inside of your mouth or nose? No Did you need to seek medical attention at a hospital or doctor's office? No When did it last happen? unk       If all above answers are "NO", may proceed with cephalosporin use.    Clindamycin/Lincomycin Nausea And Vomiting   Doxycycline Nausea And Vomiting   Treximet [Sumatriptan-Naproxen Sodium] Nausea And Vomiting   Trimethoprim Hives   Bactrim Hives and Rash    Family History  Problem Relation Age of Onset   Stroke Mother    Cancer - Lung Father    Heart attack Sister    Alcohol abuse Sister    Breast cancer Maternal Aunt    Hypertension Brother       Prior to Admission medications   Medication Sig Start Date End Date Taking? Authorizing Provider  acetaminophen (TYLENOL) 500 MG tablet Take 500  mg by mouth every 6 (six) hours as needed.    [provider]  albuterol (PROVENTIL HFA;VENTOLIN HFA) 108 (90 BASE) MCG/ACT inhaler Inhale 2 puffs into the lungs every 6 (six) hours as needed for wheezing or shortness of breath.    [provider]  albuterol (PROVENTIL) (2.5 MG/3ML) 0.083% nebulizer solution Take 2.5 mg by nebulization every 6 (six) hours as needed for wheezing or shortness of breath.    [provider]  ALPRAZolam Duanne Moron) 1 MG tablet Take by mouth See admin instructions. Take one tablet (1 mg) by mouth in the morning and two tablets (2 mg) in the evening.    [provider]  amLODipine (NORVASC) 5 MG tablet Take 5 mg by mouth daily.    [provider]  baclofen (LIORESAL) 10 MG tablet Take 10 mg by mouth 2 (two) times daily.    [provider]  benazepril (LOTENSIN) 40 MG tablet Take 40 mg by mouth daily.    [provider]  Benzocaine-Resorcinol (VAGISIL EX) Apply 1 application topically daily.    [provider]  budesonide (PULMICORT) 1 MG/2ML  nebulizer solution Take 1 mg by nebulization daily.    [provider]  ciprofloxacin (CIPRO) 500 MG tablet Take 1 tablet (500 mg total) by mouth daily with breakfast. 08/18/22   Regalado, Belkys A, MD  cloNIDine (CATAPRES) 0.2 MG tablet Take 0.2 mg by mouth 2 (two) times daily. 02/05/21   [provider]  cyclobenzaprine (FLEXERIL) 10 MG tablet Take 10 mg by mouth 3 (three) times daily as needed for muscle spasms.    [provider]  diclofenac Sodium (VOLTAREN) 1 % GEL Apply 1 Application topically 4 (four) times daily.    [provider]  docusate sodium (COLACE) 250 MG capsule Take 250 mg by mouth daily as needed for constipation.    [provider]  estradiol (ESTRACE) 2 MG tablet Take 2 mg by mouth daily.     [provider]  fluticasone (FLONASE) 50 MCG/ACT nasal spray Place 1 spray into both nostrils daily as needed for allergies.    [provider]  furosemide (LASIX) 20 MG tablet Take 1 tablet (20 mg total) by mouth daily. 08/16/22   Regalado, Belkys A, MD  Glycerin-Polysorbate 80 (REFRESH DRY EYE THERAPY OP) Place 1 drop into both eyes daily as needed (dry eyes).    [provider]  hydrOXYzine (ATARAX/VISTARIL) 25 MG tablet Take 25 mg by mouth every 8 (eight) hours as needed for anxiety.    [provider]  ipratropium-albuterol (DUONEB) 0.5-2.5 (3) MG/3ML SOLN Take 3 mLs by nebulization every 6 (six) hours as needed (shortness of breath).    [provider]  lactulose (CHRONULAC) 10 GM/15ML solution Take 15 mLs by mouth 2 (two) times daily as needed. 10/22/22   [provider]  levocetirizine (XYZAL) 5 MG tablet Take 5 mg by mouth every evening.  09/16/19   [provider]  lidocaine (LIDODERM) 5 % Place 1 patch onto the skin daily. Remove & Discard patch within 12 hours or as directed by MD 11/07/22   Tegeler, Gwenyth Allegra, MD  lidocaine (XYLOCAINE) 5 % ointment Apply 1 application  topically 2 (two) times daily as needed (hemorrhoids).    [provider]  metFORMIN (GLUCOPHAGE) 500 MG tablet Take 500 mg by mouth 2 (two) times daily with a meal.    [provider]  mupirocin ointment (BACTROBAN) 2 % Place into the nose. 10/27/22  [provider]  naloxone [facility]) 4 MG/0.1ML LIQD nasal spray kit Place 1 spray into the nose as needed (for overdose).     [provider]  nystatin (MYCOSTATIN/NYSTOP) powder Apply 1 g topically 4 (four) times daily.    [provider]  oxyCODONE (ROXICODONE) 15 MG immediate release tablet Take 1 tablet (15 mg total) by mouth every 8 (eight) hours as needed for pain. Patient taking differently: Take 15 mg by mouth every 4 (four) hours. 03/29/21   [name], MD  pantoprazole (PROTONIX) 40 MG tablet Take 40 mg by mouth 2 (two) times daily.     [provider]  PHAZYME MAXIMUM STRENGTH 250 MG CAPS Take 180 mg by mouth 4 (four) times daily - after meals and at bedtime.    [provider]  polyethylene glycol (MIRALAX / GLYCOLAX) 17 g packet Take 17 g by mouth 2 (two) times daily. Patient taking differently: Take 17 g by mouth as directed. 08/16/22   Regalado, Belkys A, MD  psyllium (METAMUCIL) 58.6 % packet Take 1 packet by mouth daily.    [provider]  rizatriptan (MAXALT-MLT) 10 MG disintegrating tablet Take 10 mg by mouth as needed for migraine. May repeat in 2 hours if needed Rizatriptan benzoate 10mg  tablet disintegrating    [provider]  SEMAGLUTIDE-WEIGHT MANAGEMENT [place] Inject into the skin. Ozempic    [provider]  senna-docusate (SENOKOT-S) 8.6-50 MG tablet Take 1 tablet by mouth 2 (two) times daily. Patient not taking: Reported on 11/05/2022 08/16/22   Regalado, Jerald Kief A, MD  simvastatin (ZOCOR) 20 MG tablet Take 20 mg by mouth at bedtime.    [provider]  SYMBICORT 160-4.5 MCG/ACT inhaler Inhale 2 puffs into the lungs in the  morning and at bedtime.  02/09/20   [provider]  topiramate (TOPAMAX) 50 MG tablet Take 50 mg by mouth daily.    [provider]  torsemide (DEMADEX) 20 MG tablet Take 20-40 mg by mouth daily.    [provider]  triamcinolone cream (KENALOG) 0.1 % Apply 1 application topically 2 (two) times daily as needed (rash).    [provider]    Physical Exam: BP 133/88   Pulse 84   Temp (!) 97.3 F (36.3 C) (Oral)   Resp 14   Ht 5' (1.524 m)   Wt 102.1 kg   SpO2 (!) 85%   BMI 43.94 kg/m   General: 62 y.o. year-old female well developed well nourished in no acute distress.  Alert and oriented x3. Cardiovascular: Regular rate and rhythm with no rubs or gallops.  No thyromegaly or JVD noted.  Bilateral lower extremity edema left greater than right.  2/4 pulses in all 4 extremities. Respiratory: Clear to auscultation with no wheezes or rales. Good inspiratory effort. Abdomen: Soft diffusely tender.  Distended.  With normal bowel sounds x4 quadrants. Muskuloskeletal: No cyanosis or clubbing noted bilaterally.  Bilateral lower extremity edema left greater than right. Neuro: CN II-XII intact, strength, sensation, reflexes Skin: Erythema noted in the left lower extremity. Psychiatry: Judgement and insight appear normal. Mood is appropriate for condition and setting          Labs on Admission:  Basic Metabolic Panel: Recent Labs  Lab 12/03/22 2053  NA 132*  K 3.7  CL 93*  CO2 30  GLUCOSE 89  BUN 6*  CREATININE 0.88  CALCIUM 9.3   Liver Function Tests: No results for input(s): "AST", "ALT", "ALKPHOS", "BILITOT", "PROT", "ALBUMIN"  in the last 168 hours. No results for input(s): "LIPASE", "AMYLASE" in the last 168 hours. No results for input(s): "AMMONIA" in the last 168 hours. CBC: Recent Labs  Lab 12/03/22 2053  WBC 6.3  NEUTROABS 3.8  HGB 9.4*  HCT 30.7*  MCV 81.9  PLT 156   Cardiac Enzymes: No results for input(s): "CKTOTAL", "CKMB",  "CKMBINDEX", "TROPONINI" in the last 168 hours.  BNP (last 3 results) Recent Labs    05/30/22 0444 11/05/22 0630  BNP 41.3 30.1    ProBNP (last 3 results) No results for input(s): "PROBNP" in the last 8760 hours.  CBG: No results for input(s): "GLUCAP" in the last 168 hours.  Radiological Exams on Admission: DG Chest 2 View  Result Date: 12/03/2022 CLINICAL DATA:  Shortness of breath, weakness and back pain. EXAM: CHEST - 2 VIEW COMPARISON:  AP Lat chest 11/07/2022 FINDINGS: The lungs are clear of infiltrates. Linear scarring or atelectasis is again noted in the left lower lung field. The sulci are sharp. The mediastinum is normally outlined. There is mild cardiomegaly without evidence of CHF. There is thoracic kyphosis and spondylosis. No spinal compression fracture is seen. IMPRESSION: No evidence of acute chest disease. Mild cardiomegaly. Thoracic kyphosis and spondylosis. Stable chest. Electronically Signed   By: Telford Nab M.D.   On: 12/03/2022 21:17    EKG: I independently viewed the EKG done and my findings are as followed: Normal sinus rhythm rate of 82.  Nonspecific ST-T changes.  QTc 486.  Assessment/Plan Present on Admission:  Acute on chronic hypoxic respiratory failure ([name])  Principal Problem:   Acute on chronic hypoxic respiratory failure ([name])  Acute on chronic hypoxic respite failure suspect secondary to volume overload Volume overload on exam with increase of girth At baseline 2 L nasal cannula nightly Recently requiring O2 supplementation even during the day Bronchodilators Pulmonary toilet Incentive spirometer Wean off oxygen supplementation as tolerated.  [place] Cirrhosis with large ascites, POA History of SBP, diagnosed 3 months ago, on ciprofloxacin PTA. The patient is on ciprofloxacin prophylactically IR consulted for possible paracentesis on 12/04/2022 Maintain MAP greater than 65 Resume home medications. MELD score based on INR 1.4 is  10. Restart home lactulose, goal 2-3 bowel movement per day. 2 g sodium restriction diet  Volume overload in the setting of [place] cirrhosis IV Lasix started in the ED, continue Closely monitor urine output IV albumin 25 g every 6 hours x 2 doses Continue IV Lasix 40 mg daily x 2 doses.  Left lower extremity edema with concern for cellulitis Rule out DVT with Doppler ultrasound left lower extremity Obtain MRSA screening test Treat with IV antibiotics empirically  Hypervolemic hyponatremia Presented with serum sodium of 132  History of hypertension Hold off home oral hypotensive due to soft BPs. Closely monitor vital signs  Chronic pain syndrome The patient requested to be put on her home regimen of opioids  History of seizure disorder Resume home seizure medications Seizure precautions  Type 2 diabetes Hold off home oral hypoglycemic Start insulin sliding scale  Hyperlipidemia Hold off hold Zocor  Obesity BMI 43 Recommend weight loss outpatient with regular physical activity and healthy dieting.  Anemia of chronic disease Hemoglobin 9.4, close to baseline. No overt bleeding reported.   Critical care time: 65 minutes.   DVT prophylaxis:  Subcu Lovenox daily  Code Status: Full as stated by the patient.  Family Communication: None at bedside   Disposition Plan: Admitted to stepdown unit   Consults called: None  Admission status: Inpat status    Status is: Inpatient The patient requires at least 2 midnights for further evaluation and treatment of present condition.   Kayleen Memos MD [facility] Pager [phone]  If 7PM-7AM, please contact night-coverage [email or web address] Password [facility]  12/04/2022, 12:13 AM

## 2022-12-04 NOTE — Progress Notes (Signed)
[facility] Progress Note Patient: [name] [id number] DOB: [birth year] DOA: 12/03/2022  DOS: the patient was seen and examined on 12/04/2022  Brief hospital course: PMH of [place] due to NASH, COPD, OSA on CPAP, chronic respiratory failure on 2 LPM, CAD, seizures, HTN, HLD, type II DM, morbid obesity, chronic pain syndrome presented to the hospital with complaints of low oxygen levels as well as acute worsening of back pain. Admitted for concerns for volume overload which currently have resolved. Assessment and Plan: Acute on chronic hypoxic respiratory failure. Back on her home oxygen. Does not appear to be having any COPD exacerbation. Continue incentive spirometry. Suspect that most likely her presentation is secondary to inaccurate measurement with her home pulse ox rather than an acute volume overload. Will resume home Lasix which is currently on hold due to hypotension.  Acute on chronic back pain. Patient reports that she has chronic back pain in her lower back area for which she takes oxycodone 15 mg every 4 hours at home. Currently the pain is more pronounced and more muscular in nature. Resume home home muscle relaxant. Continue pain control. Check x-ray lumbar spine.  History of cirrhosis secondary to NASH. Concern for ascites. Underwent ultrasound which was negative for any large volume ascites. Blood pressure is soft and therefore currently on hold. Continue lactulose. Continue ciprofloxacin.  Left leg cellulitis. Patient is on Cipro at home. Will add doxycycline. Lower extremity Doppler negative for DVT.  History of seizures. Continue home regimen.  HLD. Holding statin.  Type 2 diabetes mellitus controlled without long-term insulin use with neuropathy. Currently on sliding scale insulin. Holding home regimen.   Subjective: No nausea no vomiting.  Primary complaint is back pain.  No fever no chills.  No cough.  Breathing back to normal.  Unable to  walk secondary to pain.  Physical Exam: General: in Mild distress, No Rash Cardiovascular: S1 and S2 Present, No Murmur Respiratory: Good respiratory effort, Bilateral Air entry present. No Crackles, No wheezes Abdomen: Bowel Sound present, No tenderness Extremities: Bilateral left more than right edema, left leg redness and warmth Neuro: Alert and oriented x3, no new focal deficit  Data Reviewed: I have Reviewed nursing notes, Vitals, and Lab results. Since last encounter, pertinent lab results CBC and BMP   . I have ordered test including CBC and BMP  . I have ordered imaging x-ray lumbar spine  .   Disposition: Status is: Inpatient Remains inpatient appropriate because: Need for stabilization of blood pressure and cellulitis  enoxaparin (LOVENOX) injection 40 mg Start: 12/04/22 1000   Family Communication: No one at bedside Level of care: Progressive   Vitals:   12/04/22 0737 12/04/22 1122 12/04/22 1433 12/04/22 1615  BP:  98/63  (!) 106/54  Pulse: 79 77 80 78  Resp: 16 16 18 15   Temp:  98.4 F (36.9 C)  98 F (36.7 C)  TempSrc:  Oral  Oral  SpO2: 93% 96% 98% 96%  Weight:      Height:         Author: Berle Mull, MD 12/04/2022 8:07 PM  Please look on [email or web address] to find out who is on call.

## 2022-12-04 NOTE — Hospital Course (Addendum)
PMH of cirrhosi due to NASH, COPD, OSA on CPAP, chronic respiratory failure on 2 LPM, CAD, seizures, HTN, HLD, type II DM, morbid obesity, chronic pain syndrome presented to the hospital with complaints of low oxygen levels as well as acute worsening of back pain. Admitted for concerns for volume overload which currently have resolved.  Due to ongoing abdominal pain and CT abdomen was performed which was unremarkable other than constipation.  At present patient does not have any acute indication for IV narcotics.

## 2022-12-04 NOTE — Progress Notes (Addendum)
[facility] night cross cover note:   I was notified by RN that the patient is complaining of suboptimal pain control as relates to her abdominal discomfort after prn oxycodone.  I subsequently placed a one-time order for as needed IV Dilaudid.  Update: Patient reports that her abdominal pain is refractory to the previous dose of Dilaudid 0.5 mg IV x 1.  She notes that, historically, that her typical dose of Dilaudid that has been effective in controlling similar abdominal discomfort has been 1 to 2 mg IV.  I subsequently placed order for Dilaudid 1 mg IV x 1 dose now.  Update: Abdominal pain now slightly improved after the above interventions, but still rates discomfort as 8 out of 10.  Patient requesting additional IV Dilaudid.  I subsequently placed order for Dilaudid 1 mg IV x 1 additional dose.   Babs Bertin, DO Hospitalist

## 2022-12-04 NOTE — ED Provider Notes (Signed)
[facility] 4E CV SURGICAL PROGRESSIVE CARE Provider Note  CSN: [id number] Arrival date & time: 12/03/22 2013  Chief Complaint(s) Shortness of Breath (/), Nausea, Back Pain, and Leg Pain  HPI [name] is a 62 y.o. female with PMH CAD, liver cirrhosis, COPD on intermittent home oxygen use at night, OSA on CPAP, T2DM, SBP on daily antibiotic therapy who presents emergency department for evaluation of multiple complaints including shortness of breath, weight gain, back pain and leg pain.  Patient states that over the last 3 days she feels that she has been gaining weight and her legs have started to swell.  She also endorses nausea and vomiting..  She states that she will intermittently wear home oxygen but recently has required oxygen all the time and found herself to be 82% on room air with a home pulse ox.  She does take torsemide daily but has not been taking this medication because it makes her urinate too frequently and she states that it would be too painful to walk from the bed to the bathroom.  Also endorses abdominal distention.  Denies fever, chest pain, headache or other systemic symptoms.   Past Medical History Past Medical History:  Diagnosis Date   Abdominal pain 01/12/2019   Abdominal pain, generalized 07/10/2014   Acute on chronic respiratory failure with hypoxia ([name]) 10/18/2018   Anxiety    Arthritis    Ascites 05/01/2019   NALD   Asthma    CAD (coronary artery disease)    cath 2010 with 60-70% left Cx and normal LVF   Chronic low back pain with left-sided sciatica 10/15/2015   Chronic pain    Chronic prescription benzodiazepine use 05/01/2019   Chronic, continuous use of opioids 05/01/2019   Cirrhosis of liver not due to alcohol ([place]) 05/01/2019   Cirrhosis of liver: per CT 07/11/2014   Constipation by delayed colonic transit 12/09/2014   COPD (chronic obstructive pulmonary disease) ([name])    COPD exacerbation ([place]) 10/12/2016   Dehiscence of surgical wound left knee 03/16/2013    Depression    Depression with anxiety 01/11/2019   Deviated septum    Diabetes mellitus without complication ([place])    type 2   Generalized abdominal pain 12/09/2014   GERD (gastroesophageal reflux disease)    Headache(784.0)    migraines   Hepatic cirrhosis ([place]) 06/2014   HLD (hyperlipidemia) 01/11/2019   Hypercholesterolemia    Hypertension    Hypertension associated with diabetes ([place])    Ileus ([place]) 07/11/2014   Influenza A 10/2018   Lobar pneumonia ([place])    Microcytic anemia 01/11/2019   Migraine headache 12/09/2014   Morbid obesity ([place]) 03/09/2013   Obesity    OSA (obstructive sleep apnea) 01/11/2019   Pars defect with spondylolisthesis 05/01/2019   Perforated bowel ([place])    PONV (postoperative nausea and vomiting)    Respiratory failure, acute-on-chronic ([place]) 12/11/2010   S/P left PF UKR 03/07/2013   Seizures ([place])    as a little girl 62 years old   Shortness of breath    Sleep apnea    dont wear bipap . lost weight   Spondylolisthesis, grade 2    Tobacco abuse    Type 2 diabetes mellitus with obesity ([place]) 01/11/2019   Patient Active Problem List   Diagnosis Date Noted   Acute on chronic hypoxic respiratory failure ([place]) 12/04/2022   Acute on chronic respiratory failure with hypoxia and hypercapnia ([place]) 11/05/2022   SBP (spontaneous bacterial peritonitis) ([place]) 08/13/2022   Chronic  respiratory failure with hypoxia ([place]) 08/12/2022   Sepsis ([place]) 08/11/2022   Acute CHF (congestive heart failure) ([place]) 05/30/2022   Central chest pain 05/30/2022   Sepsis, unspecified organism ([place]) 03/27/2021   Nausea & vomiting 03/27/2021   AKI (acute kidney injury) ([place]) 03/27/2021   Hypotension 03/27/2021   Colitis 03/26/2021   Hyponatremia 02/28/2020   Peripheral edema 02/28/2020   Pain in left knee 09/12/2019   Cirrhosis of liver with ascites ([place]) 05/01/2019   Ascites 05/01/2019   NAFLD (nonalcoholic fatty liver disease) 05/01/2019   Chronic prescription benzodiazepine use  05/01/2019   Chronic, continuous use of opioids 05/01/2019   Chronic abdominal pain 01/12/2019   Hyperlipidemia associated with type 2 diabetes mellitus ([place]) 01/11/2019   Type 2 diabetes mellitus with obesity ([place]) 01/11/2019   Depression with anxiety 01/11/2019   OSA (obstructive sleep apnea) 01/11/2019   Hypomagnesemia    Hypophosphatemia    Degeneration of lumbar intervertebral disc 08/26/2018   Lumbar spondylosis 07/29/2018   Lumbar radiculopathy 10/03/2017   Chronic low back pain with left-sided sciatica 10/15/2015   Migraine headache 12/09/2014   Constipation by delayed colonic transit 12/09/2014   History of prosthetic unicompartmental arthroplasty of left knee 03/24/2013   Morbid obesity ([place]) 03/09/2013   Chronic pain    Tobacco use    CAD (coronary artery disease)    Hypertension associated with diabetes ([place])    COPD (chronic obstructive pulmonary disease) ([place])    Home Medication(s) Prior to Admission medications   Medication Sig Start Date End Date Taking? Authorizing Provider  acetaminophen (TYLENOL) 500 MG tablet Take 500 mg by mouth every 6 (six) hours as needed.    [provider]  albuterol (PROVENTIL HFA;VENTOLIN HFA) 108 (90 BASE) MCG/ACT inhaler Inhale 2 puffs into the lungs every 6 (six) hours as needed for wheezing or shortness of breath.    [provider]  albuterol (PROVENTIL) (2.5 MG/3ML) 0.083% nebulizer solution Take 2.5 mg by nebulization every 6 (six) hours as needed for wheezing or shortness of breath.    [provider]  ALPRAZolam Duanne Moron) 1 MG tablet Take by mouth See admin instructions. Take one tablet (1 mg) by mouth in the morning and two tablets (2 mg) in the evening.    [provider]  amLODipine (NORVASC) 5 MG tablet Take 5 mg by mouth daily.    [provider]  baclofen (LIORESAL) 10 MG tablet Take 10 mg by mouth 2 (two) times daily.    [provider]  benazepril (LOTENSIN) 40 MG tablet  Take 40 mg by mouth daily.    [provider]  Benzocaine-Resorcinol (VAGISIL EX) Apply 1 application topically daily.    [provider]  budesonide (PULMICORT) 1 MG/2ML nebulizer solution Take 1 mg by nebulization daily.    [provider]  ciprofloxacin (CIPRO) 500 MG tablet Take 1 tablet (500 mg total) by mouth daily with breakfast. 08/18/22   Regalado, Belkys A, MD  cloNIDine (CATAPRES) 0.2 MG tablet Take 0.2 mg by mouth 2 (two) times daily. 02/05/21   [provider]  cyclobenzaprine (FLEXERIL) 10 MG tablet Take 10 mg by mouth 3 (three) times daily as needed for muscle spasms.    [provider]  diclofenac Sodium (VOLTAREN) 1 % GEL Apply 1 Application topically 4 (four) times daily.    [provider]  docusate sodium (COLACE) 250 MG capsule Take 250 mg by mouth daily as needed for constipation.    [provider]  estradiol (  ESTRACE) 2 MG tablet Take 2 mg by mouth daily.     [provider]  fluticasone (FLONASE) 50 MCG/ACT nasal spray Place 1 spray into both nostrils daily as needed for allergies.    [provider]  furosemide (LASIX) 20 MG tablet Take 1 tablet (20 mg total) by mouth daily. 08/16/22   Regalado, Belkys A, MD  Glycerin-Polysorbate 80 (REFRESH DRY EYE THERAPY OP) Place 1 drop into both eyes daily as needed (dry eyes).    [provider]  hydrOXYzine (ATARAX/VISTARIL) 25 MG tablet Take 25 mg by mouth every 8 (eight) hours as needed for anxiety.    [provider]  ipratropium-albuterol (DUONEB) 0.5-2.5 (3) MG/3ML SOLN Take 3 mLs by nebulization every 6 (six) hours as needed (shortness of breath).    [provider]  lactulose (CHRONULAC) 10 GM/15ML solution Take 15 mLs by mouth 2 (two) times daily as needed. 10/22/22   [provider]  levocetirizine (XYZAL) 5 MG tablet Take 5 mg by mouth every evening.  09/16/19   [provider]  lidocaine (LIDODERM) 5 %  Place 1 patch onto the skin daily. Remove & Discard patch within 12 hours or as directed by MD 11/07/22   Tegeler, Gwenyth Allegra, MD  lidocaine (XYLOCAINE) 5 % ointment Apply 1 application topically 2 (two) times daily as needed (hemorrhoids).    [provider]  metFORMIN (GLUCOPHAGE) 500 MG tablet Take 500 mg by mouth 2 (two) times daily with a meal.    [provider]  mupirocin ointment (BACTROBAN) 2 % Place into the nose. 10/27/22   [provider]  naloxone Karma Greaser) 4 MG/0.1ML LIQD nasal spray kit Place 1 spray into the nose as needed (for overdose).     [provider]  nystatin (MYCOSTATIN/NYSTOP) powder Apply 1 g topically 4 (four) times daily.    [provider]  oxyCODONE (ROXICODONE) 15 MG immediate release tablet Take 1 tablet (15 mg total) by mouth every 8 (eight) hours as needed for pain. Patient taking differently: Take 15 mg by mouth every 4 (four) hours. 03/29/21   [name], MD  pantoprazole (PROTONIX) 40 MG tablet Take 40 mg by mouth 2 (two) times daily.     [provider]  PHAZYME MAXIMUM STRENGTH 250 MG CAPS Take 180 mg by mouth 4 (four) times daily - after meals and at bedtime.    [provider]  polyethylene glycol (MIRALAX / GLYCOLAX) 17 g packet Take 17 g by mouth 2 (two) times daily. Patient taking differently: Take 17 g by mouth as directed. 08/16/22   Regalado, Belkys A, MD  psyllium (METAMUCIL) 58.6 % packet Take 1 packet by mouth daily.    [provider]  rizatriptan (MAXALT-MLT) 10 MG disintegrating tablet Take 10 mg by mouth as needed for migraine. May repeat in 2 hours if needed Rizatriptan benzoate 10mg  tablet disintegrating    [provider]  SEMAGLUTIDE-WEIGHT MANAGEMENT [place] Inject into the skin. Ozempic    [provider]  senna-docusate (SENOKOT-S) 8.6-50 MG tablet Take 1 tablet by mouth 2 (two) times daily. Patient not taking: Reported on 11/05/2022 08/16/22    Regalado, Jerald Kief A, MD  simvastatin (ZOCOR) 20 MG tablet Take 20 mg by mouth at bedtime.    [provider]  SYMBICORT 160-4.5 MCG/ACT inhaler Inhale 2 puffs into the lungs in the morning and at bedtime.  02/09/20   [provider]  topiramate (TOPAMAX) 50 MG tablet Take 50 mg by mouth  daily.    [provider]  torsemide (DEMADEX) 20 MG tablet Take 20-40 mg by mouth daily.    [provider]  triamcinolone cream (KENALOG) 0.1 % Apply 1 application topically 2 (two) times daily as needed (rash).    [provider]                                                                                                                                    Past Surgical History Past Surgical History:  Procedure Laterality Date   ABDOMINAL EXPLORATION SURGERY     primary repair of colon perforation from [facility]   ABDOMINAL HYSTERECTOMY     total   BACK SURGERY     lumbar   COLONOSCOPY WITH PROPOFOL N/A 10/04/2019   Procedure: COLONOSCOPY WITH PROPOFOL;  Surgeon: Wilford Corner, MD;  Location: [facility] ENDOSCOPY;  Service: Endoscopy;  Laterality: N/A;   ESOPHAGOGASTRODUODENOSCOPY (EGD) WITH PROPOFOL N/A 10/29/2016   Procedure: ESOPHAGOGASTRODUODENOSCOPY (EGD) WITH PROPOFOL;  Surgeon: Laurence Spates, MD;  Location: [facility];  Service: Endoscopy;  Laterality: N/A;   ESOPHAGOGASTRODUODENOSCOPY (EGD) WITH PROPOFOL N/A 10/04/2019   Procedure: ESOPHAGOGASTRODUODENOSCOPY (EGD) WITH PROPOFOL;  Surgeon: Wilford Corner, MD;  Location: [facility];  Service: Endoscopy;  Laterality: N/A;   flank ablation     IR PARACENTESIS  01/12/2019   IR PARACENTESIS  05/02/2019   IR PARACENTESIS  08/03/2019   IR PARACENTESIS  08/12/2022   IR PARACENTESIS  08/14/2022   IRRIGATION AND DEBRIDEMENT KNEE Left 03/14/2013   Procedure: IRRIGATION AND DEBRIDEMENT  and Closure of wound left KNEE;  Surgeon: Mauri Pole, MD;  Location: [facility] ORS;  Service: Orthopedics;  Laterality: Left;    PATELLA-FEMORAL ARTHROPLASTY Left 03/07/2013   Procedure: LEFT PATELLA-FEMORAL ARTHROPLASTY;  Surgeon: Mauri Pole, MD;  Location: [facility];  Service: Orthopedics;  Laterality: Left;   SAVORY DILATION N/A 10/29/2016   Procedure: SAVORY DILATION;  Surgeon: Laurence Spates, MD;  Location: [facility];  Service: Endoscopy;  Laterality: N/A;   Family History Family History  Problem Relation Age of Onset   Stroke Mother    Cancer - Lung Father    Heart attack Sister    Alcohol abuse Sister    Breast cancer Maternal Aunt    Hypertension Brother     Social History Social History   Tobacco Use   Smoking status: Some Days    Packs/day: 1.5    Types: Cigarettes   Smokeless tobacco: Never   Tobacco comments:    quit 14 days and Daughter had drug relapse and pt. smoked  Substance Use Topics   Alcohol use: No   Drug use: No   Allergies Spironolactone, Gabapentin, Sulfamethoxazole-trimethoprim, Amoxicillin, Clindamycin/lincomycin, Doxycycline, Treximet [sumatriptan-naproxen sodium], Trimethoprim, and Bactrim  Review of Systems Review of Systems  Respiratory:  Positive for shortness of breath.   Cardiovascular:  Positive for leg swelling.  Gastrointestinal:  Positive for abdominal distention, nausea and vomiting.  Musculoskeletal:  Positive for back pain.    Physical Exam Vital Signs  I have reviewed the triage vital signs BP 98/63 (BP Location: Left Wrist)   Pulse 77   Temp 98.4 F (36.9 C) (Oral)   Resp 16   Ht 5' (1.524 m)   Wt 107.1 kg   SpO2 96%   BMI 46.11 kg/m   Physical Exam Vitals and nursing note reviewed.  Constitutional:      General: She is not in acute distress.    Appearance: She is well-developed. She is ill-appearing.  HENT:     Head: Normocephalic and atraumatic.  Eyes:     Conjunctiva/sclera: Conjunctivae normal.  Cardiovascular:     Rate and Rhythm: Normal rate and regular rhythm.     Heart sounds: No murmur heard. Pulmonary:     Effort:  Pulmonary effort is normal. No respiratory distress.     Breath sounds: Normal breath sounds.  Abdominal:     General: There is distension.     Palpations: Abdomen is soft.     Tenderness: There is no abdominal tenderness.  Musculoskeletal:        General: No swelling.     Cervical back: Neck supple.     Right lower leg: Edema present.     Left lower leg: Edema present.  Skin:    General: Skin is warm and dry.     Capillary Refill: Capillary refill takes less than 2 seconds.  Neurological:     Mental Status: She is alert.  Psychiatric:        Mood and Affect: Mood normal.     ED Results and Treatments Labs (all labs ordered are listed, but only abnormal results are displayed) Labs Reviewed  BASIC METABOLIC PANEL - Abnormal; Notable for the following components:      Result Value   Sodium 132 (*)    Chloride 93 (*)    BUN 6 (*)    All other components within normal limits  CBC WITH DIFFERENTIAL/PLATELET - Abnormal; Notable for the following components:   RBC 3.75 (*)    Hemoglobin 9.4 (*)    HCT 30.7 (*)    MCH 25.1 (*)    RDW 16.7 (*)    All other components within normal limits  HEPATIC FUNCTION PANEL - Abnormal; Notable for the following components:   Total Protein 8.5 (*)    Albumin 3.3 (*)    AST 55 (*)    All other components within normal limits  CBC - Abnormal; Notable for the following components:   Hemoglobin 9.9 (*)    HCT 32.0 (*)    MCH 25.1 (*)    RDW 16.8 (*)    All other components within normal limits  PROTIME-INR - Abnormal; Notable for the following components:   Prothrombin Time 15.6 (*)    INR 1.3 (*)    All other components within normal limits  BRAIN NATRIURETIC PEPTIDE  CREATININE, SERUM  TROPONIN I (HIGH SENSITIVITY)  TROPONIN I (HIGH SENSITIVITY)  Radiology IR ABDOMEN US LIMITED  Result Date: 12/04/2022 CLINICAL  DATA:  Abdominal distension, assess for ascites EXAM: LIMITED ABDOMEN ULTRASOUND FOR ASCITES TECHNIQUE: Limited ultrasound survey for ascites was performed in all four abdominal quadrants. COMPARISON:  None Available. FINDINGS: Survey of the abdominal 4 quadrants demonstrates a trace amount of abdominopelvic ascites. There is not enough to warrant therapeutic paracentesis. Procedure not performed. IMPRESSION: Trace abdominopelvic ascites by ultrasound. Electronically Signed   By: Jerilynn Mages.  Shick M.D.   On: 12/04/2022 11:01   DG Chest 2 View  Result Date: 12/03/2022 CLINICAL DATA:  Shortness of breath, weakness and back pain. EXAM: CHEST - 2 VIEW COMPARISON:  AP Lat chest 11/07/2022 FINDINGS: The lungs are clear of infiltrates. Linear scarring or atelectasis is again noted in the left lower lung field. The sulci are sharp. The mediastinum is normally outlined. There is mild cardiomegaly without evidence of CHF. There is thoracic kyphosis and spondylosis. No spinal compression fracture is seen. IMPRESSION: No evidence of acute chest disease. Mild cardiomegaly. Thoracic kyphosis and spondylosis. Stable chest. Electronically Signed   By: Telford Nab M.D.   On: 12/03/2022 21:17    Pertinent labs & imaging results that were available during my care of the patient were reviewed by me and considered in my medical decision making (see MDM for details).  Medications Ordered in ED Medications  ciprofloxacin (CIPRO) tablet 500 mg (500 mg Oral Given 12/04/22 0806)  ipratropium-albuterol (DUONEB) 0.5-2.5 (3) MG/3ML nebulizer solution 3 mL (3 mLs Nebulization Given 12/04/22 0737)  enoxaparin (LOVENOX) injection 40 mg (40 mg Subcutaneous Given 12/04/22 1005)  albuterol (PROVENTIL) (2.5 MG/3ML) 0.083% nebulizer solution 2.5 mg (has no administration in time range)  hydrOXYzine (ATARAX) tablet 25 mg (has no administration in time range)  lactulose (CHRONULAC) 10 GM/15ML solution 10 g (10 g Oral Given 12/04/22 0946)   lidocaine (LIDODERM) 5 % 1 patch (1 patch Transdermal Patch Applied 12/04/22 0944)  pantoprazole (PROTONIX) EC tablet 40 mg (40 mg Oral Given 12/04/22 1005)  mometasone-formoterol (DULERA) 200-5 MCG/ACT inhaler 2 puff (2 puffs Inhalation Given 12/04/22 0737)  topiramate (TOPAMAX) tablet 50 mg (has no administration in time range)  oxyCODONE (Oxy IR/ROXICODONE) immediate release tablet 15 mg (15 mg Oral Given 12/04/22 1019)  morphine (PF) 4 MG/ML injection 4 mg (4 mg Intravenous Given 12/03/22 2216)  furosemide (LASIX) injection 40 mg (40 mg Intravenous Given 12/03/22 2218)  ondansetron (ZOFRAN) injection 4 mg (4 mg Intravenous Given 12/03/22 2216)  oxyCODONE (Oxy IR/ROXICODONE) immediate release tablet 15 mg (15 mg Oral Given 12/04/22 0537)  albumin human 25 % solution 25 g (12.5 g Intravenous New Bag/Given 12/04/22 0952)                                                                                                                                     Procedures .Critical Care  Performed by: Teressa Lower, MD Authorized by: Teressa Lower, MD  Critical care provider statement:    Critical care time (minutes):  30   Critical care was necessary to treat or prevent imminent or life-threatening deterioration of the following conditions:  Respiratory failure   Critical care was time spent personally by me on the following activities:  Development of treatment plan with patient or surrogate, discussions with consultants, evaluation of patient's response to treatment, examination of patient, ordering and review of laboratory studies, ordering and review of radiographic studies, ordering and performing treatments and interventions, pulse oximetry, re-evaluation of patient's condition and review of old charts Ultrasound ED Abd  Date/Time: 12/04/2022 12:05 PM  Performed by: Teressa Lower, MD Authorized by: Teressa Lower, MD   Procedure details:    Assessment for:  Intra-abdominal fluid    Images:  not archived      (including critical care time)  Medical Decision Making / ED Course   This patient presents to the ED for concern of shortness of breath, leg swelling, abdominal distention, this involves an extensive number of treatment options, and is a complaint that carries with it a high risk of complications and morbidity.  The differential diagnosis includes fluid overload, pneumonia, pleural effusion, SBP, hypoalbuminemia, ACS  MDM: Patient seen emergency room for evaluation of multiple complaints described above.  Physical exam with bilateral lower extremity pitting edema, abdominal distention with fluid wave.  Laboratory evaluation with a hemoglobin of 9.4, sodium 132, mild hypoalbuminemia to 8.3.  BNP and high-sensitivity troponin unremarkable.  Chest x-ray unremarkable.  Bedside ultrasound showing intra-abdominal ascites.  Diuresis begun and given patient's new consistent oxygen requirement with fluid overload, she will require hospital admission.  Orders placed for IR paracentesis.  Patient then admitted.   Additional history obtained:  -External records from outside source obtained and reviewed including: Chart review including previous notes, labs, imaging, consultation notes   Lab Tests: -I ordered, reviewed, and interpreted labs.   The pertinent results include:   Labs Reviewed  BASIC METABOLIC PANEL - Abnormal; Notable for the following components:      Result Value   Sodium 132 (*)    Chloride 93 (*)    BUN 6 (*)    All other components within normal limits  CBC WITH DIFFERENTIAL/PLATELET - Abnormal; Notable for the following components:   RBC 3.75 (*)    Hemoglobin 9.4 (*)    HCT 30.7 (*)    MCH 25.1 (*)    RDW 16.7 (*)    All other components within normal limits  HEPATIC FUNCTION PANEL - Abnormal; Notable for the following components:   Total Protein 8.5 (*)    Albumin 3.3 (*)    AST 55 (*)    All other components within normal limits  CBC - Abnormal;  Notable for the following components:   Hemoglobin 9.9 (*)    HCT 32.0 (*)    MCH 25.1 (*)    RDW 16.8 (*)    All other components within normal limits  PROTIME-INR - Abnormal; Notable for the following components:   Prothrombin Time 15.6 (*)    INR 1.3 (*)    All other components within normal limits  BRAIN NATRIURETIC PEPTIDE  CREATININE, SERUM  TROPONIN I (HIGH SENSITIVITY)  TROPONIN I (HIGH SENSITIVITY)      EKG   EKG Interpretation  Date/Time:  Wednesday December 03 2022 20:19:11 EDT Ventricular Rate:  82 PR Interval:  132 QRS Duration: 92 QT Interval:  416 QTC Calculation: 486 R Axis:   92 Text Interpretation: Normal  sinus rhythm Rightward axis Low voltage QRS Nonspecific T wave abnormality Prolonged QT Abnormal ECG When compared with ECG of 07-Nov-2022 17:22, PREVIOUS ECG IS PRESENT Confirmed by Georgina Snell [phone]) on 12/04/2022 11:04:56 AM         Imaging Studies ordered: I ordered imaging studies including chest x-ray I independently visualized and interpreted imaging. I agree with the radiologist interpretation   Medicines ordered and prescription drug management: Meds ordered this encounter  Medications   DISCONTD: albuterol (VENTOLIN HFA) 108 (90 Base) MCG/ACT inhaler 2 puff   morphine (PF) 4 MG/ML injection 4 mg   furosemide (LASIX) injection 40 mg   ondansetron (ZOFRAN) injection 4 mg   DISCONTD: oxyCODONE (Oxy IR/ROXICODONE) immediate release tablet 15 mg   oxyCODONE (Oxy IR/ROXICODONE) immediate release tablet 15 mg   ciprofloxacin (CIPRO) tablet 500 mg   DISCONTD: albuterol (PROVENTIL) (2.5 MG/3ML) 0.083% nebulizer solution 2.5 mg   ipratropium-albuterol (DUONEB) 0.5-2.5 (3) MG/3ML nebulizer solution 3 mL   DISCONTD: furosemide (LASIX) injection 40 mg   albumin human 25 % solution 25 g   DISCONTD: furosemide (LASIX) injection 40 mg   enoxaparin (LOVENOX) injection 40 mg   albuterol (PROVENTIL) (2.5 MG/3ML) 0.083% nebulizer solution 2.5 mg    hydrOXYzine (ATARAX) tablet 25 mg   lactulose (CHRONULAC) 10 GM/15ML solution 10 g   lidocaine (LIDODERM) 5 % 1 patch    Remove & Discard patch within 12 hours or as directed by MD     pantoprazole (PROTONIX) EC tablet 40 mg   mometasone-formoterol (DULERA) 200-5 MCG/ACT inhaler 2 puff   topiramate (TOPAMAX) tablet 50 mg   DISCONTD: lidocaine (XYLOCAINE) 1 % (with pres) injection    King-Cushman, Juliet: cabinet override   oxyCODONE (Oxy IR/ROXICODONE) immediate release tablet 15 mg    -I have reviewed the patients home medicines and have made adjustments as needed  Critical interventions Oxygen supplementation, diuresis    Cardiac Monitoring: The patient was maintained on a cardiac monitor.  I personally viewed and interpreted the cardiac monitored which showed an underlying rhythm of: NSR  Social Determinants of Health:  Factors impacting patients care include: none   Reevaluation: After the interventions noted above, I reevaluated the patient and found that they have :improved  Co morbidities that complicate the patient evaluation  Past Medical History:  Diagnosis Date   Abdominal pain 01/12/2019   Abdominal pain, generalized 07/10/2014   Acute on chronic respiratory failure with hypoxia ([place]) 10/18/2018   Anxiety    Arthritis    Ascites 05/01/2019   NALD   Asthma    CAD (coronary artery disease)    cath 2010 with 60-70% left Cx and normal LVF   Chronic low back pain with left-sided sciatica 10/15/2015   Chronic pain    Chronic prescription benzodiazepine use 05/01/2019   Chronic, continuous use of opioids 05/01/2019   Cirrhosis of liver not due to alcohol ([place]) 05/01/2019   Cirrhosis of liver: per CT 07/11/2014   Constipation by delayed colonic transit 12/09/2014   COPD (chronic obstructive pulmonary disease) ([place])    COPD exacerbation ([place]) 10/12/2016   Dehiscence of surgical wound left knee 03/16/2013   Depression    Depression with anxiety 01/11/2019   Deviated septum     Diabetes mellitus without complication ([place])    type 2   Generalized abdominal pain 12/09/2014   GERD (gastroesophageal reflux disease)    Headache(784.0)    migraines   Hepatic cirrhosis ([place]) 06/2014   HLD (hyperlipidemia) 01/11/2019   Hypercholesterolemia  Hypertension    Hypertension associated with diabetes ([place])    Ileus ([place]) 07/11/2014   Influenza A 10/2018   Lobar pneumonia ([place])    Microcytic anemia 01/11/2019   Migraine headache 12/09/2014   Morbid obesity ([place]) 03/09/2013   Obesity    OSA (obstructive sleep apnea) 01/11/2019   Pars defect with spondylolisthesis 05/01/2019   Perforated bowel ([place])    PONV (postoperative nausea and vomiting)    Respiratory failure, acute-on-chronic ([place]) 12/11/2010   S/P left PF UKR 03/07/2013   Seizures ([place])    as a little girl 62 years old   Shortness of breath    Sleep apnea    dont wear bipap . lost weight   Spondylolisthesis, grade 2    Tobacco abuse    Type 2 diabetes mellitus with obesity ([place]) 01/11/2019      Dispostion: I considered admission for this patient, and due to new consistent oxygen requirement and fluid overload patient require hospital admission     Final Clinical Impression(s) / ED Diagnoses Final diagnoses:  None     @PCDICTATION @    Teressa Lower, MD 12/04/22 1208

## 2022-12-04 NOTE — Progress Notes (Signed)
Left lower extremity venous duplex has been completed. Preliminary results can be found in CV Proc through chart review.   12/04/22 12:54 PM [name] RVT

## 2022-12-05 ENCOUNTER — Inpatient Hospital Stay (HOSPITAL_COMMUNITY): Payer: 59

## 2022-12-05 DIAGNOSIS — J9621 Acute and chronic respiratory failure with hypoxia: Secondary | ICD-10-CM | POA: Diagnosis not present

## 2022-12-05 MED ORDER — IOHEXOL 350 MG/ML SOLN
75.0000 mL | Freq: Once | INTRAVENOUS | Status: AC | PRN
  Administered 2022-12-05: 75 mL via INTRAVENOUS

## 2022-12-05 MED ORDER — HYDROMORPHONE HCL 1 MG/ML IJ SOLN
1.0000 mg | Freq: Once | INTRAMUSCULAR | Status: AC
  Administered 2022-12-05: 1 mg via INTRAVENOUS
  Filled 2022-12-05: qty 1

## 2022-12-05 MED ORDER — IOHEXOL 9 MG/ML PO SOLN
500.0000 mL | ORAL | Status: AC
  Administered 2022-12-05 (×2): 500 mL via ORAL

## 2022-12-05 MED ORDER — BUDESONIDE 0.5 MG/2ML IN SUSP
1.0000 mg | Freq: Every day | RESPIRATORY_TRACT | Status: DC
  Administered 2022-12-05: 1 mg via RESPIRATORY_TRACT
  Filled 2022-12-05: qty 4

## 2022-12-05 MED ORDER — DIPHENHYDRAMINE HCL 25 MG PO CAPS
25.0000 mg | ORAL_CAPSULE | Freq: Three times a day (TID) | ORAL | Status: DC | PRN
  Administered 2022-12-05 – 2022-12-06 (×4): 25 mg via ORAL
  Filled 2022-12-05 (×4): qty 1

## 2022-12-05 MED ORDER — BISACODYL 10 MG RE SUPP: 10.0000 mg | Freq: Once | RECTAL | Status: DC

## 2022-12-05 MED ORDER — FLEET ENEMA 7-19 GM/118ML RE ENEM
1.0000 | ENEMA | Freq: Every day | RECTAL | Status: DC | PRN
  Administered 2022-12-07: 1 via RECTAL
  Filled 2022-12-05 (×2): qty 1

## 2022-12-05 MED ORDER — FUROSEMIDE 40 MG PO TABS
40.0000 mg | ORAL_TABLET | Freq: Every day | ORAL | Status: DC
  Administered 2022-12-06 – 2022-12-07 (×2): 40 mg via ORAL
  Filled 2022-12-05 (×2): qty 1

## 2022-12-05 NOTE — Progress Notes (Signed)
[facility] Progress Note Patient: [name] [id number] DOB: 11/28/1960 DOA: 12/03/2022  DOS: the patient was seen and examined on 12/05/2022  Brief hospital course: PMH of [place] due to NASH, COPD, OSA on CPAP, chronic respiratory failure on 2 LPM, CAD, seizures, HTN, HLD, type II DM, morbid obesity, chronic pain syndrome presented to the hospital with complaints of low oxygen levels as well as acute worsening of back pain. Admitted for concerns for volume overload which currently have resolved.  Assessment and Plan: Acute on chronic hypoxic respiratory failure. Uses 2.5 L at home of oxygen. Back on her home oxygen.  Both at rest as well as on exertion. Does not appear to be having any COPD exacerbation. Continue incentive spirometry. Suspect that most likely her presentation is secondary to inaccurate measurement with her home pulse ox rather than an acute volume overload. Treated with IV Lasix.  Resume home Lasix tomorrow.   Acute on chronic back pain. Patient reports that she has chronic back pain in her lower back area for which she takes oxycodone 15 mg every 4 hours at home. Currently the pain is more pronounced and more muscular in nature. Resume home home muscle relaxant. Continue pain control. On this plan as well as CT abdomen shows no evidence of any acute fracture in the thoracolumbar region. But patient claims that she is not able to ambulate due to pain she was able to walk 200 feet with physical therapy.  6 clicks score 22.   History of cirrhosis secondary to NASH. Concern for ascites. Underwent ultrasound which was negative for any large volume ascites. Blood pressure is soft. Continue lactulose. Continue ciprofloxacin initiated for SBP.   Left leg cellulitis. Patient is on Cipro at home. Will add doxycycline. Lower extremity Doppler negative for DVT.  Severe abdominal pain. Constipation. Patient required multiple rounds of IV Dilaudid on the night  of 3/21-22. Due to ongoing nausea as well as abdominal pain CT abdomen was requested.  Shows evidence of constipation without any evidence of acute abnormality. Will aggressively treat constipation and monitor.   History of seizures. Continue home regimen.   HLD. Holding statin.  Type 2 diabetes mellitus controlled without long-term insulin use with neuropathy. Currently on sliding scale insulin. Holding home regimen.  OSA. Class III obesity. Body mass index is 45.17 kg/m.  Placing the patient at high risk for poor outcome. Patient is on semaglutide outpatient.  Subjective: Reports nausea no vomiting.  Continues to have abdominal pain.  Continues to have back pain.  No fever no chills.  Had a BM later in the day.  Physical Exam: General: in Mild distress, No Rash Cardiovascular: S1 and S2 Present, No Murmur Respiratory: Good respiratory effort, Bilateral Air entry present. No Crackles, No wheezes Abdomen: Bowel Sound present, left-sided tenderness Extremities: Improving edema Neuro: Alert and oriented x3, no new focal deficit  Data Reviewed: I have Reviewed nursing notes, Vitals, and Lab results. Since last encounter, pertinent lab results CBC and BMP   . I have ordered test including CBC and BMP  . I have ordered imaging CT abdomen  .   Disposition: Status is: Inpatient Remains inpatient appropriate because: Need for improvement in blood pressure and pain symptoms enoxaparin (LOVENOX) injection 40 mg Start: 12/04/22 1000   Family Communication: No one at bedside Level of care: Progressive switch to telemetry Vitals:   12/05/22 0441 12/05/22 0752 12/05/22 0759 12/05/22 1534  BP: (!) 146/72 (!) 110/55  (!) 146/62  Pulse: 89 81 89  94  Resp: 15 15 16 14   Temp: 98 F (36.7 C) 98.4 F (36.9 C)  98.3 F (36.8 C)  TempSrc: Oral Oral  Oral  SpO2: 92% 95% 94% 98%  Weight: 104.9 kg     Height:         Author: Berle Mull, MD 12/05/2022 6:32 PM  Please look on  [email or web address] to find out who is on call.

## 2022-12-05 NOTE — Evaluation (Signed)
Physical Therapy Evaluation Patient Details Name: [name] MRN: [id number] DOB: 1961-02-22 Today's Date: 12/05/2022  History of Present Illness  62 yo female admitted 3/20 with hypoxia and back pain . PMHx; cirrhosis due to NASH, COPD, OSA on CPAP, chronic respiratory failure on 2 LPM, CAD, seizures, HTN, HLD, t2DM, morbid obesity, chronic pain syndrome  Clinical Impression  Pt pleasant and reports back pain and difficulty breathing at home. Pt states she normally only wears O2 at night and as needed during the day. Pt with highly fluctuant saturation as static on RA ranges 87-99 with accurate pleth and similar with gait at times able to tolerate mobility on RA and other trials desaturating. Pt maintained >90% on 1L with activity during session.  Pt with decreased activity tolerance compared to baseline and reliance on RW vs her baseline cane who will benefit from acute therapy to maximize mobility and function to return home. Encouraged up to chair for meals, toilet and daily ambulation with nursing/mobility.   HR 84-96      Recommendations for follow up therapy are one component of a multi-disciplinary discharge planning process, led by the attending physician.  Recommendations may be updated based on patient status, additional functional criteria and insurance authorization.  Follow Up Recommendations No PT follow up      Assistance Recommended at Discharge PRN  Patient can return home with the following  Assistance with cooking/housework;Assist for transportation    Equipment Recommendations None recommended by PT  Recommendations for Other Services       Functional Status Assessment Patient has had a recent decline in their functional status and/or demonstrates limited ability to make significant improvements in function in a reasonable and predictable amount of time     Precautions / Restrictions Precautions Precautions: Fall;Other (comment) Precaution Comments: watch sats       Mobility  Bed Mobility Overal bed mobility: Modified Independent             General bed mobility comments: with rail, bed flat    Transfers Overall transfer level: Modified independent                 General transfer comment: pt able to rise from bed and toilet without assist    Ambulation/Gait Ambulation/Gait assistance: Supervision Gait Distance (Feet): 200 Feet Assistive device: Rolling walker (2 wheels) Gait Pattern/deviations: Step-through pattern, Decreased stride length   Gait velocity interpretation: >2.62 ft/sec, indicative of community ambulatory   General Gait Details: pt initially walked 100' on RA with desaturation to 85% without consistent pleth. 2nd trial 200' with RW on 2L and maintained >95% even with decrease to 1L end of session  Stairs            Wheelchair Mobility    Modified [place] (Stroke Patients Only)       Balance Overall balance assessment: Needs assistance, History of Falls   Sitting balance-Leahy Scale: Good       Standing balance-Leahy Scale: Good Standing balance comment: pt able to stand at sink without UE support and walk limited distance without UE support                             Pertinent Vitals/Pain Pain Assessment Pain Assessment: 0-10 Pain Score: 5  Pain Location: low back Pain Descriptors / Indicators: Aching, Guarding Pain Intervention(s): Limited activity within patient's tolerance, Monitored during session, Repositioned    Home Living Family/patient expects to be discharged to::  Private residence Living Arrangements: Spouse/significant other Available Help at Discharge: Family;Available 24 hours/day Type of Home: House Home Access: Stairs to enter Entrance Stairs-Rails: Right Entrance Stairs-Number of Steps: 3   Home Layout: One level Home Equipment: Conservation officer, nature (2 wheels);Cane - single point;Tub bench      Prior Function Prior Level of Function :  Independent/Modified Independent             Mobility Comments: normally walks without AD, using cane recently ADLs Comments: sits on bench to bathe, doesn't drive     Hand Dominance        Extremity/Trunk Assessment   Upper Extremity Assessment Upper Extremity Assessment: Generalized weakness    Lower Extremity Assessment Lower Extremity Assessment: Generalized weakness    Cervical / Trunk Assessment Cervical / Trunk Assessment: Other exceptions Cervical / Trunk Exceptions: pt with tendency to maintain left neck flexion and shift hips to the right  Communication   Communication: No difficulties  Cognition Arousal/Alertness: Awake/alert Behavior During Therapy: WFL for tasks assessed/performed Overall Cognitive Status: Within Functional Limits for tasks assessed                                          General Comments      Exercises     Assessment/Plan    PT Assessment Patient needs continued PT services  PT Problem List Decreased activity tolerance;Cardiopulmonary status limiting activity;Decreased balance;Decreased knowledge of use of DME       PT Treatment Interventions DME instruction;Therapeutic activities;Gait training;Stair training;Functional mobility training;Patient/family education;Therapeutic exercise    PT Goals (Current goals can be found in the Care Plan section)  Acute Rehab PT Goals Patient Stated Goal: return home and get rid of this back pain PT Goal Formulation: With patient Time For Goal Achievement: 12/12/22 Potential to Achieve Goals: Good    Frequency Min 1X/week     Co-evaluation               AM-PAC PT "6 Clicks" Mobility  Outcome Measure Help needed turning from your back to your side while in a flat bed without using bedrails?: None Help needed moving from lying on your back to sitting on the side of a flat bed without using bedrails?: None Help needed moving to and from a bed to a chair (including  a wheelchair)?: None Help needed standing up from a chair using your arms (e.g., wheelchair or bedside chair)?: None Help needed to walk in hospital room?: A Little Help needed climbing 3-5 steps with a railing? : A Little 6 Click Score: 22    End of Session Equipment Utilized During Treatment: Oxygen Activity Tolerance: Patient tolerated treatment well Patient left: in chair;with call bell/phone within reach;with chair alarm set Nurse Communication: Mobility status PT Visit Diagnosis: Other abnormalities of gait and mobility (R26.89)    Time: [id number] PT Time Calculation (min) (ACUTE ONLY): 25 min   Charges:   PT Evaluation $PT Eval Moderate Complexity: 1 Mod          [place], PT Acute Rehabilitation Services Office: [phone]   Lamarr Lulas 12/05/2022, 10:53 AM

## 2022-12-05 NOTE — Progress Notes (Signed)
Pt refused to wear bipap at night

## 2022-12-05 NOTE — Evaluation (Addendum)
Occupational Therapy Evaluation Patient Details Name: [name] MRN: [id number] DOB: 01/06/1961 Today's Date: 12/05/2022   History of Present Illness 62 yo female admitted 3/20 with hypoxia and back pain . PMHx; cirrhosis due to NASH, COPD, OSA on CPAP, chronic respiratory failure on 2 LPM, CAD, seizures, HTN, HLD, t2DM, morbid obesity, chronic pain syndrome   Clinical Impression   PTA, patient reports being independent in bADLs and iADLs ambulating without AD mostly but recently using SPC as needed. Patient lives with spouse who is able to provide 24/7 support and assist although patient expresses they wish spouse would help with bathing in particular. Pt currently completing bed mobility with Mod I, functional ambulation with Supervision + RW but patient is not reliant on RW for support for brief periods. Pt completed toilet t/f and seated pericare with Supervision. Pt LLE does display decrease foot clearance when completing sit>supine, Pt would likely benefit from tub bench to safely perform bathing and address her fear of falling. Pt sats ranged from 87%-95% Sp02 during functional activity. Pt would benefit from continued skilled acute OT services to address above deficits and help transition home. Patient presenting near functional baseline, no follow-up OT recommended at this time.      Recommendations for follow up therapy are one component of a multi-disciplinary discharge planning process, led by the attending physician.  Recommendations may be updated based on patient status, additional functional criteria and insurance authorization.   Follow Up Recommendations  No OT follow up     Assistance Recommended at Discharge PRN  Patient can return home with the following A little help with bathing/dressing/bathroom;Assist for transportation;Assistance with cooking/housework    Functional Status Assessment  Patient has had a recent decline in their functional status and demonstrates the  ability to make significant improvements in function in a reasonable and predictable amount of time.  Equipment Recommendations  Tub/shower bench    Recommendations for Other Services       Precautions / Restrictions Precautions Precautions: Fall;Other (comment) Precaution Comments: watch sats Restrictions Weight Bearing Restrictions: No      Mobility Bed Mobility Overal bed mobility: Modified Independent             General bed mobility comments: with rail, HOB elevated    Transfers Overall transfer level: Modified independent Equipment used: Rolling walker (2 wheels)               General transfer comment: Pt completed toilet t/f and sit>stand without AD or assist      Balance Overall balance assessment: Needs assistance, History of Falls Sitting-balance support: Feet supported, No upper extremity supported Sitting balance-Leahy Scale: Good     Standing balance support: During functional activity Standing balance-Leahy Scale: Good Standing balance comment: Pt able to enter bathroom without use of AD, performed action for short distance ~67ft                           ADL either performed or assessed with clinical judgement   ADL Overall ADL's : Needs assistance/impaired Eating/Feeding: Independent   Grooming: Supervision/safety;Standing   Upper Body Bathing: Supervision/ safety;Standing   Lower Body Bathing: Min guard;Sitting/lateral leans   Upper Body Dressing : Supervision/safety;Sitting   Lower Body Dressing: Min guard;Sitting/lateral leans   Toilet Transfer: Supervision/safety;Rolling walker (2 wheels);Ambulation   Toileting- Clothing Manipulation and Hygiene: Supervision/safety;Sitting/lateral lean Toileting - Clothing Manipulation Details (indicate cue type and reason): Pt completed seated pericare with Supervision Tub/ Shower  Transfer: Min guard;Ambulation;Rolling walker (2 wheels) Tub/Shower Transfer Details (indicate cue  type and reason): Pt with decreased LLE foot clearance noted during bed mobility Functional mobility during ADLs: Supervision/safety;Rolling walker (2 wheels)       Vision   Additional Comments: Pt reports having cataracts suirgery two years ago, Pt reports mild discrepanies in reading small print and may need to go back to examine vision soon     Perception     Praxis      Pertinent Vitals/Pain Pain Assessment Pain Score: 5  Pain Location: low back Pain Descriptors / Indicators: Aching, Guarding Pain Intervention(s): Limited activity within patient's tolerance, Monitored during session     Hand Dominance     Extremity/Trunk Assessment Upper Extremity Assessment Upper Extremity Assessment: Generalized weakness   Lower Extremity Assessment Lower Extremity Assessment: Generalized weakness   Cervical / Trunk Assessment Cervical / Trunk Assessment: Other exceptions Cervical / Trunk Exceptions: Pt has residual abdominal pain from a previous incidence   Communication Communication Communication: No difficulties   Cognition Arousal/Alertness: Awake/alert Behavior During Therapy: WFL for tasks assessed/performed Overall Cognitive Status: Within Functional Limits for tasks assessed                                       General Comments  Pt Sp02 fluctuating between 87%-95% on RA, quickly rebounds. At end of OT session Pt reported a headache, BP 148/78 nurse notified    Exercises     Shoulder Instructions      Home Living Family/patient expects to be discharged to:: Private residence Living Arrangements: Spouse/significant other Available Help at Discharge: Family;Available 24 hours/day Type of Home: House Home Access: Stairs to enter [employer] of Steps: 5 steps back entrance Entrance Stairs-Rails: Right Home Layout: One level     Bathroom Shower/Tub: Teacher, early years/pre: Standard     Home Equipment: Cane - single  point;Rollator (4 wheels);Hand held shower head;Grab bars - tub/shower;Shower seat          Prior Functioning/Environment Prior Level of Function : Independent/Modified Independent             Mobility Comments: normally walks without AD, using cane recently. Does not drive ADLs Comments: Pt reports she sits on shower seat to bathe, someitmes stands in the shower but has fear of falling and would like assist with bathing from spouse        OT Problem List: Decreased strength;Decreased activity tolerance;Impaired balance (sitting and/or standing);Cardiopulmonary status limiting activity      OT Treatment/Interventions: Self-care/ADL training;Energy conservation;Balance training;Therapeutic exercise;Patient/family education    OT Goals(Current goals can be found in the care plan section) Acute Rehab OT Goals Patient Stated Goal: To return home OT Goal Formulation: With patient Time For Goal Achievement: 12/19/22 Potential to Achieve Goals: Good  OT Frequency: Min 2X/week    Co-evaluation              AM-PAC OT "6 Clicks" Daily Activity     Outcome Measure Help from another person eating meals?: None Help from another person taking care of personal grooming?: None Help from another person toileting, which includes using toliet, bedpan, or urinal?: A Little Help from another person bathing (including washing, rinsing, drying)?: A Little Help from another person to put on and taking off regular upper body clothing?: None Help from another person to put on and taking off regular lower body  clothing?: A Little 6 Click Score: 21   End of Session Equipment Utilized During Treatment: Gait belt;Rolling walker (2 wheels) Nurse Communication: Mobility status  Activity Tolerance: Patient tolerated treatment well Patient left: in bed;with call bell/phone within reach  OT Visit Diagnosis: Unsteadiness on feet (R26.81);Repeated falls (R29.6);Muscle weakness (generalized) (M62.81)                 Time: [id number] OT Time Calculation (min): 26 min Charges:  OT General Charges $OT Visit: 1 Visit OT Evaluation $OT Eval Moderate Complexity: 1 Mod OT Treatments $Therapeutic Activity: 8-22 mins  12/05/2022  AB, OTR/L  Acute Rehabilitation Services  Office: [phone]   Cori Razor 12/05/2022, 1:33 PM

## 2022-12-06 DIAGNOSIS — J9621 Acute and chronic respiratory failure with hypoxia: Secondary | ICD-10-CM | POA: Diagnosis not present

## 2022-12-06 LAB — CBC
HCT: 32.8 % — ABNORMAL LOW (ref 36.0–46.0)
Hemoglobin: 9.5 g/dL — ABNORMAL LOW (ref 12.0–15.0)
MCH: 24.6 pg — ABNORMAL LOW (ref 26.0–34.0)
MCHC: 29 g/dL — ABNORMAL LOW (ref 30.0–36.0)
MCV: 85 fL (ref 80.0–100.0)
Platelets: 147 10*3/uL — ABNORMAL LOW (ref 150–400)
RBC: 3.86 MIL/uL — ABNORMAL LOW (ref 3.87–5.11)
RDW: 17.1 % — ABNORMAL HIGH (ref 11.5–15.5)
WBC: 6.1 10*3/uL (ref 4.0–10.5)
nRBC: 0 % (ref 0.0–0.2)

## 2022-12-06 LAB — BASIC METABOLIC PANEL
Anion gap: 6 (ref 5–15)
BUN: <5 mg/dL — ABNORMAL LOW (ref 8–23)
CO2: 34 mmol/L — ABNORMAL HIGH (ref 22–32)
Calcium: 8.4 mg/dL — ABNORMAL LOW (ref 8.9–10.3)
Chloride: 98 mmol/L (ref 98–111)
Creatinine, Ser: 0.82 mg/dL (ref 0.44–1.00)
GFR, Estimated: >60 mL/min (ref >60)
Glucose, Bld: 95 mg/dL (ref 70–99)
Potassium: 3.6 mmol/L (ref 3.5–5.1)
Sodium: 138 mmol/L (ref 135–145)

## 2022-12-06 LAB — MAGNESIUM: Magnesium: 1.3 mg/dL — ABNORMAL LOW (ref 1.7–2.4)

## 2022-12-06 MED ORDER — DICYCLOMINE HCL 10 MG PO CAPS
10.0000 mg | ORAL_CAPSULE | Freq: Three times a day (TID) | ORAL | Status: DC
  Administered 2022-12-06 – 2022-12-07 (×4): 10 mg via ORAL
  Filled 2022-12-06 (×7): qty 1

## 2022-12-06 MED ORDER — BUDESONIDE 0.5 MG/2ML IN SUSP
1.0000 mg | Freq: Every day | RESPIRATORY_TRACT | Status: DC
  Filled 2022-12-06: qty 4

## 2022-12-06 MED ORDER — MAGNESIUM SULFATE 2 GM/50ML IV SOLN
2.0000 g | Freq: Once | INTRAVENOUS | Status: AC
  Administered 2022-12-06: 2 g via INTRAVENOUS
  Filled 2022-12-06: qty 50

## 2022-12-06 MED ORDER — FUROSEMIDE 10 MG/ML IJ SOLN
40.0000 mg | Freq: Once | INTRAMUSCULAR | Status: AC
  Administered 2022-12-06: 40 mg via INTRAVENOUS
  Filled 2022-12-06: qty 4

## 2022-12-06 MED ORDER — KETOROLAC TROMETHAMINE 15 MG/ML IJ SOLN
15.0000 mg | Freq: Four times a day (QID) | INTRAMUSCULAR | Status: DC
  Administered 2022-12-06 – 2022-12-07 (×4): 15 mg via INTRAVENOUS
  Filled 2022-12-06 (×4): qty 1

## 2022-12-06 MED ORDER — LINACLOTIDE 145 MCG PO CAPS
145.0000 ug | ORAL_CAPSULE | Freq: Every day | ORAL | Status: DC
  Administered 2022-12-06 – 2022-12-07 (×2): 145 ug via ORAL
  Filled 2022-12-06 (×2): qty 1

## 2022-12-06 MED ORDER — BACLOFEN 10 MG PO TABS
10.0000 mg | ORAL_TABLET | Freq: Three times a day (TID) | ORAL | Status: DC
  Administered 2022-12-06 – 2022-12-07 (×3): 10 mg via ORAL
  Filled 2022-12-06 (×5): qty 1

## 2022-12-06 MED ORDER — ONDANSETRON HCL 4 MG/2ML IJ SOLN
4.0000 mg | Freq: Four times a day (QID) | INTRAMUSCULAR | Status: DC | PRN
  Administered 2022-12-06 – 2022-12-07 (×3): 4 mg via INTRAVENOUS
  Filled 2022-12-06 (×3): qty 2

## 2022-12-06 NOTE — Progress Notes (Signed)
[facility] Progress Note Patient: [name] [id number] DOB: Nov 28, 1960 DOA: 12/03/2022  DOS: the patient was seen and examined on 12/06/2022  Brief hospital course: PMH of [place] due to NASH, COPD, OSA on CPAP, chronic respiratory failure on 2 LPM, CAD, seizures, HTN, HLD, type II DM, morbid obesity, chronic pain syndrome presented to the hospital with complaints of low oxygen levels as well as acute worsening of back pain. Admitted for concerns for volume overload which currently have resolved.  Due to ongoing abdominal pain and CT abdomen was performed which was unremarkable other than constipation.  At present patient does not have any acute indication for IV narcotics.  Assessment and Plan: Acute on chronic hypoxic respiratory failure. Uses 2.5 L at home of oxygen. Back on her home oxygen.  Both at rest as well as on exertion. Does not appear to be having any COPD exacerbation. Continue incentive spirometry. Suspect that most likely her presentation is secondary to inaccurate measurement with her home pulse ox rather than an acute volume overload. Treated with IV Lasix.  Will give 1 more dose of IV Lasix and resume oral tomorrow.   Acute on chronic back pain. Patient reports that she has chronic back pain in her lower back area for which she takes oxycodone 15 mg every 4 hours at home. Currently the pain is more pronounced and more muscular in nature. Patient continues to report ongoing pain control issues and requesting IV Dilaudid. Patient has been educated that she does not have anything acutely broken or fractured based on the CT scan as well as x-ray to indicate any IV therapy. Would recommend to continue oral narcotic regimen per home. I offered to increase her Flexeril to baclofen which she mentions has worked in the past. CT abdomen shows no evidence of any acute fracture in the thoracolumbar region. patient claims that she is not able to ambulate due to pain she was  able to walk 200 feet with physical therapy.  6 clicks score 22.   History of cirrhosis secondary to NASH. Concern for ascites. Underwent ultrasound which was negative for any large volume ascites. Blood pressure is soft. Continue lactulose. Continue ciprofloxacin initiated for SBP.   Left leg cellulitis. Patient is on Cipro at home. Continue to doxycycline. Lower extremity Doppler negative for DVT.  Severe abdominal pain. Constipation. Patient required multiple rounds of IV Dilaudid on the night of 3/21-22. Due to ongoing nausea as well as abdominal pain CT abdomen was requested.  Shows evidence of constipation without any evidence of acute abnormality. Will aggressively treat constipation and monitor.   History of seizures. Continue home regimen.   HLD. Holding statin.  Type 2 diabetes mellitus controlled without long-term insulin use with neuropathy. Currently on sliding scale insulin. Holding home regimen.  OSA. Class III obesity. Body mass index is 45.08 kg/m.  Placing the patient at high risk for poor outcome. Patient is on semaglutide outpatient.  Subjective: Continues to report pain.  Now in her right toe as well as in the left leg.  Reports that she was crying at the edge of the bed yesterday due to pain in her back. No nausea or vomiting but requesting IV pain medication.  Physical Exam: In mild distress. Left leg swelling more pronounced. No acute abnormality in the right forefoot or foot area. Pulses palpable bilaterally. Mild warmth seen on the left leg. S1-S2 present. Clear to auscultation. Bowel sound present. Diffuse tenderness on the abdominal examination. Heart rate was WNL while the  patient reported severe pain. Patient was on room air with saturation above 90%.  Data Reviewed: I have Reviewed nursing notes, Vitals, and Lab results. Reviewed CBC and BMP.  Reordered CBC and BMP.  Disposition: Status is: Inpatient Remains inpatient appropriate  because: Will provide IV Lasix and monitor for improvement.  Patient has been educated that she will most likely will be stable for discharge tomorrow on 3/24.  enoxaparin (LOVENOX) injection 40 mg Start: 12/04/22 1000   Family Communication: No one at bedside Level of care: Progressive switch to telemetry Vitals:   12/06/22 0533 12/06/22 0747 12/06/22 1307 12/06/22 1709  BP:  (!) 145/82 (!) 144/78 136/64  Pulse:    82  Resp:  12  12  Temp:  98.1 F (36.7 C) 97.9 F (36.6 C) 98.1 F (36.7 C)  TempSrc:  Oral Oral Oral  SpO2:   90% 96%  Weight: 104.7 kg     Height:         Author: Berle Mull, MD 12/06/2022 6:00 PM  Please look on [email or web address] to find out who is on call.

## 2022-12-07 LAB — BASIC METABOLIC PANEL
Anion gap: 6 (ref 5–15)
BUN: 8 mg/dL (ref 8–23)
CO2: 32 mmol/L (ref 22–32)
Calcium: 8.2 mg/dL — ABNORMAL LOW (ref 8.9–10.3)
Chloride: 96 mmol/L — ABNORMAL LOW (ref 98–111)
Creatinine, Ser: 0.91 mg/dL (ref 0.44–1.00)
GFR, Estimated: >60 mL/min (ref >60)
Glucose, Bld: 94 mg/dL (ref 70–99)
Potassium: 3.5 mmol/L (ref 3.5–5.1)
Sodium: 134 mmol/L — ABNORMAL LOW (ref 135–145)

## 2022-12-07 LAB — CBC
HCT: 33.6 % — ABNORMAL LOW (ref 36.0–46.0)
Hemoglobin: 10.1 g/dL — ABNORMAL LOW (ref 12.0–15.0)
MCH: 25 pg — ABNORMAL LOW (ref 26.0–34.0)
MCHC: 30.1 g/dL (ref 30.0–36.0)
MCV: 83.2 fL (ref 80.0–100.0)
Platelets: 154 10*3/uL (ref 150–400)
RBC: 4.04 MIL/uL (ref 3.87–5.11)
RDW: 17.1 % — ABNORMAL HIGH (ref 11.5–15.5)
WBC: 7 10*3/uL (ref 4.0–10.5)
nRBC: 0 % (ref 0.0–0.2)

## 2022-12-07 LAB — LIPASE, BLOOD: Lipase: 28 U/L (ref 11–51)

## 2022-12-07 LAB — MAGNESIUM: Magnesium: 1.6 mg/dL — ABNORMAL LOW (ref 1.7–2.4)

## 2022-12-07 MED ORDER — LINACLOTIDE 145 MCG PO CAPS: 145.0000 ug | ORAL_CAPSULE | Freq: Every day | ORAL | 0 refills | Status: DC

## 2022-12-07 MED ORDER — MAGNESIUM OXIDE -MG SUPPLEMENT 400 (240 MG) MG PO TABS
400.0000 mg | ORAL_TABLET | Freq: Two times a day (BID) | ORAL | Status: DC
  Administered 2022-12-07: 400 mg via ORAL
  Filled 2022-12-07: qty 1

## 2022-12-07 MED ORDER — DOXYCYCLINE HYCLATE 100 MG PO TABS: 100.0000 mg | ORAL_TABLET | Freq: Two times a day (BID) | ORAL | 0 refills | Status: AC

## 2022-12-07 MED ORDER — MAGNESIUM OXIDE -MG SUPPLEMENT 400 (240 MG) MG PO TABS: 400.0000 mg | ORAL_TABLET | Freq: Two times a day (BID) | ORAL | 0 refills | Status: DC

## 2022-12-07 MED ORDER — BACLOFEN 10 MG PO TABS: 10.0000 mg | ORAL_TABLET | Freq: Three times a day (TID) | ORAL | 0 refills | Status: AC

## 2022-12-07 MED ORDER — GLYCERIN (LAXATIVE) 2 G RE SUPP
1.0000 | Freq: Once | RECTAL | Status: AC
  Administered 2022-12-07: 1 via RECTAL
  Filled 2022-12-07: qty 1

## 2022-12-07 MED ORDER — POLYETHYLENE GLYCOL 3350 17 G PO PACK: 17.0000 g | PACK | Freq: Two times a day (BID) | ORAL | 0 refills | Status: AC

## 2022-12-07 MED ORDER — SIMETHICONE 80 MG PO TABS: 1.0000 | ORAL_TABLET | Freq: Four times a day (QID) | ORAL | 0 refills | Status: AC

## 2022-12-07 MED ORDER — MAGNESIUM SULFATE 4 GM/100ML IV SOLN
4.0000 g | Freq: Once | INTRAVENOUS | Status: AC
  Administered 2022-12-07: 4 g via INTRAVENOUS
  Filled 2022-12-07: qty 100

## 2022-12-07 MED ORDER — HYDROMORPHONE HCL 1 MG/ML IJ SOLN
1.0000 mg | Freq: Once | INTRAMUSCULAR | Status: AC | PRN
  Administered 2022-12-07: 1 mg via INTRAVENOUS
  Filled 2022-12-07: qty 1

## 2022-12-07 MED ORDER — BISACODYL 10 MG RE SUPP: 10.0000 mg | Freq: Every day | RECTAL | 0 refills | Status: AC | PRN

## 2022-12-07 MED ORDER — BUDESONIDE 0.5 MG/2ML IN SUSP
1.0000 mg | Freq: Every day | RESPIRATORY_TRACT | Status: DC
  Filled 2022-12-07: qty 4

## 2022-12-07 MED ORDER — CLONIDINE HCL 0.1 MG PO TABS: 0.1000 mg | ORAL_TABLET | Freq: Every day | ORAL | 0 refills | Status: DC

## 2022-12-07 MED ORDER — ENSURE ENLIVE PO LIQD
237.0000 mL | Freq: Two times a day (BID) | ORAL | Status: DC
  Administered 2022-12-07: 237 mL via ORAL
  Filled 2022-12-07: qty 237

## 2022-12-07 MED ORDER — MOMETASONE FURO-FORMOTEROL FUM 200-5 MCG/ACT IN AERO: 2.0000 | INHALATION_SPRAY | Freq: Two times a day (BID) | RESPIRATORY_TRACT | Status: DC | PRN

## 2022-12-07 MED ORDER — DICYCLOMINE HCL 10 MG PO CAPS: 10.0000 mg | ORAL_CAPSULE | Freq: Three times a day (TID) | ORAL | 0 refills | Status: DC

## 2022-12-07 MED ORDER — NALOXONE HCL 0.4 MG/ML IJ SOLN: 0.4000 mg | INTRAMUSCULAR | Status: DC | PRN

## 2022-12-07 MED ORDER — FUROSEMIDE 40 MG PO TABS: 40.0000 mg | ORAL_TABLET | Freq: Every day | ORAL | 1 refills | Status: AC

## 2022-12-07 NOTE — Progress Notes (Signed)
Mobility Specialist Progress Note:   12/07/22 1245  Mobility  Activity Ambulated with assistance in hallway  Level of Assistance Standby assist, set-up cues, supervision of patient - no hands on  Assistive Device Front wheel walker  Distance Ambulated (ft) 250 ft  Activity Response Tolerated well  $Mobility charge 1 Mobility   Pre- Mobility:95% SpO2 (RA) Post Mobility: 94% SpO2 (RA)  Pt EOB willing to participate in mobility. No complaints of pain. Left EOB with call bell in reach and ll needs met.   Gareth Eagle Oluwatosin Bracy Mobility Specialist Please contact via [employer] or  Rehab Office at [phone]

## 2022-12-07 NOTE — Progress Notes (Addendum)
[facility] night cross cover note:   I was notified by RN that the patient experienced some postprandial abdominal pain following consumption of pack of graham crackers this evening.  The patient reports suboptimal pain control on existing prn oxycodone, and requests additional pain medications for the above.  She notes that when she is historically received IV Dilaudid, that she typically receives 1 to 2 mg per dose.  I subsequently ordered Dilaudid 1 mg IV x 1 dose prn, and ordered lipase level as well.   Update: glycerin suppository ordered for constipation.    Babs Bertin, DO Hospitalist

## 2022-12-07 NOTE — Plan of Care (Signed)
  Problem: Clinical Measurements: Goal: Ability to maintain clinical measurements within normal limits will improve Outcome: Progressing Goal: Will remain free from infection Outcome: Progressing Goal: Cardiovascular complication will be avoided Outcome: Progressing   Problem: Activity: Goal: Risk for activity intolerance will decrease Outcome: Progressing   Problem: Nutrition: Goal: Adequate nutrition will be maintained Outcome: Progressing   Problem: Coping: Goal: Level of anxiety will decrease Outcome: Progressing

## 2022-12-07 NOTE — Plan of Care (Signed)
Patient is discharging to home.

## 2022-12-09 ENCOUNTER — Encounter (HOSPITAL_COMMUNITY): Payer: Self-pay | Admitting: Gastroenterology

## 2022-12-09 NOTE — Discharge Summary (Signed)
Physician Discharge Summary   Patient: [name] MRN: [id number] DOB: Jun 15, 1961  Admit date:     12/03/2022  Discharge date: 12/07/2022  Discharge Physician: Berle Mull  PCP: Shirline Frees, MD  Recommendations at discharge: Follow-up with PCP in 1 week.   Follow-up Information     Shirline Frees, MD. Schedule an appointment as soon as possible for a visit in 1 week(s).   Specialty: Family Medicine Why: Discuss switching Escitalopram to Duloxetine. Contact information: [place] [address] [phone]                Discharge Diagnoses: Principal Problem:   Acute on chronic hypoxic respiratory failure ([place]) Active Problems:   COPD (chronic obstructive pulmonary disease) ([name])   Chronic respiratory failure with hypoxia ([name])   Type 2 diabetes mellitus with obesity ([name])   Hypertension associated with diabetes ([name])   Chronic, continuous use of opioids   CAD (coronary artery disease)   Morbid obesity ([name])   Constipation by delayed colonic transit   Hyperlipidemia associated with type 2 diabetes mellitus ([place])   Depression with anxiety   OSA (obstructive sleep apnea)   NAFLD (nonalcoholic fatty liver disease)   Hypotension  Hospital Course: PMH of cirrhosi due to NASH, COPD, OSA on CPAP, chronic respiratory failure on 2 LPM, CAD, seizures, HTN, HLD, type II DM, morbid obesity, chronic pain syndrome presented to the hospital with complaints of low oxygen levels as well as acute worsening of back pain. Admitted for concerns for volume overload which currently have resolved.  Due to ongoing abdominal pain and CT abdomen was performed which was unremarkable other than constipation.  At present patient does not have any acute indication for IV narcotics. Assessment and Plan  Acute on chronic hypoxic respiratory failure. Uses 2.5 L at home of oxygen. Back on her home oxygen.  Both at rest as well as on exertion. Does not appear to be  having any COPD exacerbation. Continue incentive spirometry. Suspect that most likely her presentation is secondary to inaccurate measurement with her home pulse ox rather than an acute volume overload. Treated with IV Lasix.  Continue oral Lasix.   Acute on chronic back pain. Patient reports that she has chronic back pain in her lower back area for which she takes oxycodone 15 mg every 4 hours at home. Currently the pain is more pronounced and more muscular in nature. Patient continues to report ongoing pain control issues and requesting IV Dilaudid. Patient has been educated that she does not have anything acutely broken or fractured based on the CT scan as well as x-ray to indicate any IV therapy. Would recommend to continue oral narcotic regimen per home. Switching from Flexeril to baclofen. CT abdomen shows no evidence of any acute fracture in the thoracolumbar region. patient claims that she is not able to ambulate due to pain she was able to walk 200 feet with physical therapy.  6 clicks score 22.   History of cirrhosis secondary to NASH. Concern for ascites. Underwent ultrasound which was negative for any large volume ascites. Blood pressure is soft. Continue lactulose. Continue ciprofloxacin initiated for SBP.   Left leg cellulitis. Patient is on Cipro at home. Continue to doxycycline. Lower extremity Doppler negative for DVT.   Severe abdominal pain. Constipation. Patient required multiple rounds of IV Dilaudid on the night of 3/21-22. Due to ongoing nausea as well as abdominal pain CT abdomen was requested.  Shows evidence of constipation without any  evidence of acute abnormality. Continue to aggressively treat constipation and monitor. Linzess added.  As well as Bentyl.  Recommend to switch Lexapro to Cymbalta.   History of seizures. Continue home regimen.   HLD. Resuming statin.   Type 2 diabetes mellitus controlled without long-term insulin use with  neuropathy. Currently on sliding scale insulin. Resuming home regimen.   OSA. Class III obesity. Body mass index is 45.08 kg/m.  Placing the patient at high risk for poor outcome. Patient is on semaglutide outpatient.     Pain control - [employer] Controlled Substance Reporting System database was reviewed. and patient was instructed, not to drive, operate heavy machinery, perform activities at heights, swimming or participation in water activities or provide baby-sitting services while on Pain, Sleep and Anxiety Medications; until their outpatient Physician has advised to do so again. Also recommended to not to take more than prescribed Pain, Sleep and Anxiety Medications.  Consultants:  none  Procedures performed:  none  DISCHARGE MEDICATION: Allergies as of 12/07/2022       Reactions   Spironolactone Nausea Only   Gabapentin Other (See Comments)   sleepwalking   Sulfamethoxazole-trimethoprim Hives   Hives all over body   Amoxicillin Nausea And Vomiting, Other (See Comments)   tolerated augmentin in the past (03/2013) without issue. Did it involve swelling of the face/tongue/throat, SOB, or low BP? No Did it involve sudden or severe rash/hives, skin peeling, or any reaction on the inside of your mouth or nose? No Did you need to seek medical attention at a hospital or doctor's office? No When did it last happen? unk       If all above answers are "NO", may proceed with cephalosporin use.   Clindamycin/lincomycin Nausea And Vomiting   Doxycycline Nausea And Vomiting   Treximet [sumatriptan-naproxen Sodium] Nausea And Vomiting        Medication List     STOP taking these medications    amLODipine 5 MG tablet Commonly known as: NORVASC   cyclobenzaprine 10 MG tablet Commonly known as: FLEXERIL   docusate sodium 250 MG capsule Commonly known as: COLACE   Phazyme Maximum Strength 250 MG Caps Generic drug: Simethicone Replaced by: Simethicone 80 MG Tabs    psyllium 58.6 % packet Commonly known as: METAMUCIL   torsemide 20 MG tablet Commonly known as: DEMADEX       TAKE these medications    acetaminophen 500 MG tablet Commonly known as: TYLENOL Take 500 mg by mouth every 6 (six) hours as needed for mild pain.   albuterol (2.5 MG/3ML) 0.083% nebulizer solution Commonly known as: PROVENTIL Take 2.5 mg by nebulization every 6 (six) hours as needed for wheezing or shortness of breath.   albuterol 108 (90 Base) MCG/ACT inhaler Commonly known as: VENTOLIN HFA Inhale 2 puffs into the lungs every 6 (six) hours as needed for wheezing or shortness of breath.   ALPRAZolam 1 MG tablet Commonly known as: XANAX Take 1-2 mg by mouth See admin instructions. Take one tablet (1 mg) by mouth in the morning and two tablets (2 mg) in the evening.   baclofen 10 MG tablet Commonly known as: LIORESAL Take 1 tablet (10 mg total) by mouth 3 (three) times daily. What changed: when to take this   benazepril 40 MG tablet Commonly known as: LOTENSIN Take 40 mg by mouth daily.   bisacodyl 10 MG suppository Commonly known as: DULCOLAX Place 1 suppository (10 mg total) rectally daily as needed for moderate constipation.  budesonide 1 MG/2ML nebulizer solution Commonly known as: PULMICORT Take 1 mg by nebulization daily.   ciprofloxacin 500 MG tablet Commonly known as: CIPRO Take 1 tablet (500 mg total) by mouth daily with breakfast.   cloNIDine 0.1 MG tablet Commonly known as: CATAPRES Take 1 tablet (0.1 mg total) by mouth daily. What changed:  medication strength how much to take when to take this   dicyclomine 10 MG capsule Commonly known as: BENTYL Take 1 capsule (10 mg total) by mouth 4 (four) times daily -  before meals and at bedtime.   doxycycline 100 MG tablet Commonly known as: VIBRA-TABS Take 1 tablet (100 mg total) by mouth 2 (two) times daily for 4 days.   escitalopram 20 MG tablet Commonly known as: LEXAPRO Take 20 mg by  mouth daily.   estradiol 2 MG tablet Commonly known as: ESTRACE Take 2 mg by mouth daily.   fluticasone 50 MCG/ACT nasal spray Commonly known as: FLONASE Place 1 spray into both nostrils daily as needed for allergies.   furosemide 40 MG tablet Commonly known as: LASIX Take 1 tablet (40 mg total) by mouth daily. Take additional 40 mg when you have a weight gain of 3 lbs in 1 day or 5 lbs in 1 week. What changed:  medication strength how much to take additional instructions   hydrOXYzine 25 MG tablet Commonly known as: ATARAX Take 25 mg by mouth every 8 (eight) hours as needed for anxiety.   ipratropium-albuterol 0.5-2.5 (3) MG/3ML Soln Commonly known as: DUONEB Take 3 mLs by nebulization every 6 (six) hours as needed (shortness of breath).   lactulose 10 GM/15ML solution Commonly known as: CHRONULAC Take 15 mLs by mouth 2 (two) times daily as needed for mild constipation.   levocetirizine 5 MG tablet Commonly known as: XYZAL Take 5 mg by mouth every evening.   lidocaine 5 % ointment Commonly known as: XYLOCAINE Apply 1 application topically 2 (two) times daily as needed (hemorrhoids).   lidocaine 5 % Commonly known as: Lidoderm Place 1 patch onto the skin daily. Remove & Discard patch within 12 hours or as directed by MD   linaclotide 145 MCG Caps capsule Commonly known as: LINZESS Take 1 capsule (145 mcg total) by mouth daily before breakfast.   magnesium oxide 400 (240 Mg) MG tablet Commonly known as: MAG-OX Take 1 tablet (400 mg total) by mouth 2 (two) times daily.   metFORMIN 500 MG tablet Commonly known as: GLUCOPHAGE Take 500 mg by mouth 2 (two) times daily with a meal.   mupirocin ointment 2 % Commonly known as: BACTROBAN Place into the nose.   Narcan 4 MG/0.1ML Liqd nasal spray kit Generic drug: naloxone Place 1 spray into the nose as needed (for overdose).   nystatin powder Commonly known as: MYCOSTATIN/NYSTOP Apply 1 g topically 4 (four) times  daily.   oxyCODONE 15 MG immediate release tablet Commonly known as: ROXICODONE Take 1 tablet (15 mg total) by mouth every 8 (eight) hours as needed for pain. What changed: when to take this   pantoprazole 40 MG tablet Commonly known as: PROTONIX Take 40 mg by mouth 2 (two) times daily.   polyethylene glycol 17 g packet Commonly known as: MIRALAX / GLYCOLAX Take 17 g by mouth 2 (two) times daily. What changed: when to take this   REFRESH DRY EYE THERAPY OP Place 1 drop into both eyes daily as needed (dry eyes).   rizatriptan 10 MG disintegrating tablet Commonly known as: MAXALT-MLT Take  10 mg by mouth as needed for migraine. May repeat in 2 hours if needed Rizatriptan benzoate 10mg  tablet disintegrating   SEMAGLUTIDE-WEIGHT MANAGEMENT [place] Inject into the skin. Ozempic   senna-docusate 8.6-50 MG tablet Commonly known as: Senokot-S Take 1 tablet by mouth 2 (two) times daily.   Simethicone 80 MG Tabs Take 1 tablet (80 mg total) by mouth in the morning, at noon, in the evening, and at bedtime. Replaces: Phazyme Maximum Strength 250 MG Caps   simvastatin 20 MG tablet Commonly known as: ZOCOR Take 20 mg by mouth at bedtime.   Symbicort 160-4.5 MCG/ACT inhaler Generic drug: budesonide-formoterol Inhale 2 puffs into the lungs in the morning and at bedtime.   topiramate 50 MG tablet Commonly known as: TOPAMAX Take 50 mg by mouth daily.   triamcinolone cream 0.1 % Commonly known as: KENALOG Apply 1 application topically 2 (two) times daily as needed (rash).   VAGISIL EX Apply 1 application topically daily.   Voltaren 1 % Gel Generic drug: diclofenac Sodium Apply 1 Application topically 4 (four) times daily.       Disposition: Home Diet recommendation: Cardiac diet  Discharge Exam: Vitals:   12/06/22 2312 12/07/22 0854 12/07/22 0900 12/07/22 1231  BP: (!) 149/78 124/69  (!) 140/75  Pulse: 93     Resp: 18   19  Temp:  98.2 F (36.8 C)  (!) 97.5 F (36.4 C)   TempSrc:  Oral  Oral  SpO2: 93%  95% 95%  Weight:      Height:       General: Appear in mild distress; no visible Abnormal Neck Mass Or lumps, Conjunctiva normal Cardiovascular: S1 and S2 Present, no Murmur, Respiratory: good respiratory effort, Bilateral Air entry present and CTA, no Crackles, no wheezes Abdomen: Bowel Sound present, mild diffuse tenderness Extremities: left Pedal edema Neurology: alert and oriented to time, place, and person  [facility] Weights   12/04/22 0357 12/05/22 0441 12/06/22 0533  Weight: 107.1 kg 104.9 kg 104.7 kg   Condition at discharge: stable  The results of significant diagnostics from this hospitalization (including imaging, microbiology, ancillary and laboratory) are listed below for reference.   Imaging Studies: CT ABDOMEN PELVIS W CONTRAST  Result Date: 12/05/2022 CLINICAL DATA:  Acute on chronic hypoxic respiratory failure, acute abdominal pain, shortness of breath, nausea, back and leg pain EXAM: CT ABDOMEN AND PELVIS WITH CONTRAST TECHNIQUE: Multidetector CT imaging of the abdomen and pelvis was performed using the standard protocol following bolus administration of intravenous contrast. RADIATION DOSE REDUCTION: This exam was performed according to the departmental dose-optimization program which includes automated exposure control, adjustment of the mA and/or kV according to patient size and/or use of iterative reconstruction technique. CONTRAST:  66mL OMNIPAQUE IOHEXOL 350 MG/ML SOLN IV. Dilute oral contrast. COMPARISON:  08/11/2022 FINDINGS: Lower chest: Linear subsegmental atelectasis LEFT lower lobe Hepatobiliary: Nodular cirrhotic liver. No discrete hepatic mass. Gallbladder unremarkable. Pancreas: Unremarkable Spleen: Low-attenuation lesion superiorly within the spleen 17 x 14 mm previously 15 x 14 mm, likely cyst. Additional tiny probable cyst 5 mm. Adrenals/Urinary Tract: Small RIGHT renal cysts largest 12 mm diameter; no follow-up imaging  recommended. Adrenal glands, kidneys, ureters, and bladder otherwise normal appearance. Stomach/Bowel: Appendix not visualized. Stool throughout colon. Food debris in stomach. Stomach and bowel loops otherwise normal appearance. Vascular/Lymphatic: Atherosclerotic calcifications aorta and iliac arteries without aneurysm. Normal sized celiac axis and periportal lymph nodes. No adenopathy. Reproductive: Uterus surgically absent. Nonvisualization of ovaries. Other: Scattered ascites. No free air, hernia, or  inflammatory process. Musculoskeletal: Grade 1 spondylolisthesis L5-S1 secondary to BILATERAL spondylolysis L5. Degenerative disc disease changes L5-S1. IMPRESSION: Cirrhotic liver with scattered ascites. Grade 1 spondylolisthesis L5-S1 secondary to BILATERAL spondylolysis L5. No acute intra-abdominal or intrapelvic abnormalities. Probable small splenic cysts as above. Aortic Atherosclerosis (ICD10-I70.0). Electronically Signed   By: Lavonia Dana M.D.   On: 12/05/2022 12:06   DG Lumbar Spine 2-3 Views  Result Date: 12/04/2022 CLINICAL DATA:  [id number] with back pain. EXAM: LUMBAR SPINE - 2-3 VIEW COMPARISON:  Lumbar spine films 11/07/2022 FINDINGS: There is no evidence of lumbar spine fracture. There is a grossly normal bone mineralization. There are chronic L5 pars defects, L5-S1 disc collapse and vacuum phenomenon, and again noted grade 1, borderline grade 2 anterolisthesis of L5 on S1. Also noted is trace retrolisthesis of L2-3, L3-4 and L4-5 likely degenerative. No new alignment abnormality is seen. There is lumbar spondylosis greatest at L1 and L2 but no bridging osteophytes. In addition to L5-S1 disc collapse there is mild-to-moderate disc space loss at T12-L1, L1-L2, and L2-L3 with mild disc narrowing L3-4. Only the L4-5 disc is normal in height. There is progressive facet hypertrophy from L2-3 inferiorly. The SI joints are unremarkable, as visualized. There is scattered calcific plaque in the abdominal aorta.  Moderate to severe fecal stasis. Comparison to the prior study reveals no significant interval change. IMPRESSION: 1. No evidence of lumbar spine fracture. 2. Chronic L5 pars defects with grade 1, borderline grade 2 anterolisthesis of L5 on S1. 3. Multilevel degenerative disc disease, spondylosis, and facet hypertrophy the latter with inferiorly progressive gradient. 4. Aortic atherosclerosis. 5. Moderate to severe fecal stasis. 6. No significant interval change. Electronically Signed   By: Telford Nab M.D.   On: 12/04/2022 20:36   VAS [place] LOWER EXTREMITY VENOUS (DVT)  Result Date: 12/04/2022  Lower Venous DVT Study Patient Name:  [name]  Date of Exam:   12/04/2022 Medical Rec #: [id number]     Accession #:    [id number] Date of Birth: 1961/01/12     Patient Gender: F Patient Age:   62 years Exam Location[facility]  [facility] Procedure:      VAS [place] LOWER EXTREMITY VENOUS (DVT) Referring Phys: Irene Pap --------------------------------------------------------------------------------  Indications: Swelling.  Risk Factors: None identified. Limitations: Body habitus and poor ultrasound/tissue interface. Comparison Study: No prior studies. Performing Technologist: Oliver Hum RVT  Examination Guidelines: A complete evaluation includes B-mode imaging, spectral Doppler, color Doppler, and power Doppler as needed of all accessible portions of each vessel. Bilateral testing is considered an integral part of a complete examination. Limited examinations for reoccurring indications may be performed as noted. The reflux portion of the exam is performed with the patient in reverse Trendelenburg.  +-----+---------------+---------+-----------+----------+--------------+ RIGHTCompressibilityPhasicitySpontaneityPropertiesThrombus Aging +-----+---------------+---------+-----------+----------+--------------+ CFV  Full           Yes      Yes                                  +-----+---------------+---------+-----------+----------+--------------+   +---------+---------------+---------+-----------+----------+--------------+ LEFT     CompressibilityPhasicitySpontaneityPropertiesThrombus Aging +---------+---------------+---------+-----------+----------+--------------+ CFV      Full           Yes      Yes                                 +---------+---------------+---------+-----------+----------+--------------+ SFJ  Full                                                        +---------+---------------+---------+-----------+----------+--------------+ FV Prox  Full                                                        +---------+---------------+---------+-----------+----------+--------------+ FV Mid   Full                                                        +---------+---------------+---------+-----------+----------+--------------+ FV Distal               Yes      Yes                                 +---------+---------------+---------+-----------+----------+--------------+ PFV      Full                                                        +---------+---------------+---------+-----------+----------+--------------+ POP      Full           Yes      Yes                                 +---------+---------------+---------+-----------+----------+--------------+ PTV      Full                                                        +---------+---------------+---------+-----------+----------+--------------+ PERO     Full                                                        +---------+---------------+---------+-----------+----------+--------------+    Summary: RIGHT: - No evidence of common femoral vein obstruction.  LEFT: - There is no evidence of deep vein thrombosis in the lower extremity. However, portions of this examination were limited- see technologist comments above.  - No cystic structure found in the popliteal  fossa.  *See table(s) above for measurements and observations. Electronically signed by Jamelle Haring on 12/04/2022 at 3:51:14 PM.    Final    IR ABDOMEN US LIMITED  Result Date: 12/04/2022 CLINICAL DATA:  Abdominal distension, assess for ascites EXAM: LIMITED ABDOMEN ULTRASOUND FOR ASCITES TECHNIQUE: Limited ultrasound survey for ascites was performed in all four abdominal quadrants. COMPARISON:  None Available. FINDINGS: Survey of the abdominal 4 quadrants  demonstrates a trace amount of abdominopelvic ascites. There is not enough to warrant therapeutic paracentesis. Procedure not performed. IMPRESSION: Trace abdominopelvic ascites by ultrasound. Electronically Signed   By: Jerilynn Mages.  Shick M.D.   On: 12/04/2022 11:01   DG Chest 2 View  Result Date: 12/03/2022 CLINICAL DATA:  Shortness of breath, weakness and back pain. EXAM: CHEST - 2 VIEW COMPARISON:  AP Lat chest 11/07/2022 FINDINGS: The lungs are clear of infiltrates. Linear scarring or atelectasis is again noted in the left lower lung field. The sulci are sharp. The mediastinum is normally outlined. There is mild cardiomegaly without evidence of CHF. There is thoracic kyphosis and spondylosis. No spinal compression fracture is seen. IMPRESSION: No evidence of acute chest disease. Mild cardiomegaly. Thoracic kyphosis and spondylosis. Stable chest. Electronically Signed   By: Telford Nab M.D.   On: 12/03/2022 21:17    Microbiology: Results for orders placed or performed during the hospital encounter of 11/05/22  Resp panel by RT-PCR (RSV, Flu A&B, Covid) Anterior Nasal Swab     Status: None   Collection Time: 11/05/22  6:30 AM   Specimen: Anterior Nasal Swab  Result Value Ref Range Status   SARS Coronavirus 2 by RT PCR NEGATIVE NEGATIVE Final    Comment: (NOTE) SARS-CoV-2 target nucleic acids are NOT DETECTED.  The SARS-CoV-2 RNA is generally detectable in upper respiratory specimens during the acute phase of infection. The lowest concentration  of SARS-CoV-2 viral copies this assay can detect is 138 copies/mL. A negative result does not preclude SARS-Cov-2 infection and should not be used as the sole basis for treatment or other patient management decisions. A negative result may occur with  improper specimen collection/handling, submission of specimen other than nasopharyngeal swab, presence of viral mutation(s) within the areas targeted by this assay, and inadequate number of viral copies(<138 copies/mL). A negative result must be combined with clinical observations, patient history, and epidemiological information. The expected result is Negative.  Fact Sheet for Patients:  EntrepreneurPulse.com.au  Fact Sheet for Healthcare Providers:  IncredibleEmployment.be  This test is no t yet approved or cleared by the [place] FDA and  has been authorized for detection and/or diagnosis of SARS-CoV-2 by FDA under an Emergency Use Authorization (EUA). This EUA will remain  in effect (meaning this test can be used) for the duration of the COVID-19 declaration under Section 564(b)(1) of the Act, 21 U.S.C.section 360bbb-3(b)(1), unless the authorization is terminated  or revoked sooner.       Influenza A by PCR NEGATIVE NEGATIVE Final   Influenza B by PCR NEGATIVE NEGATIVE Final    Comment: (NOTE) The Xpert Xpress SARS-CoV-2/FLU/RSV plus assay is intended as an aid in the diagnosis of influenza from Nasopharyngeal swab specimens and should not be used as a sole basis for treatment. Nasal washings and aspirates are unacceptable for Xpert Xpress SARS-CoV-2/FLU/RSV testing.  Fact Sheet for Patients: EntrepreneurPulse.com.au  Fact Sheet for Healthcare Providers: IncredibleEmployment.be  This test is not yet approved or cleared by the [place] FDA and has been authorized for detection and/or diagnosis of SARS-CoV-2 by FDA under an Emergency Use  Authorization (EUA). This EUA will remain in effect (meaning this test can be used) for the duration of the COVID-19 declaration under Section 564(b)(1) of the Act, 21 U.S.C. section 360bbb-3(b)(1), unless the authorization is terminated or revoked.     Resp Syncytial Virus by PCR NEGATIVE NEGATIVE Final    Comment: (NOTE) Fact Sheet for Patients: EntrepreneurPulse.com.au  Fact Sheet for Healthcare Providers:  IncredibleEmployment.be  This test is not yet approved or cleared by the [place] and has been authorized for detection and/or diagnosis of SARS-CoV-2 by FDA under an Emergency Use Authorization (EUA). This EUA will remain in effect (meaning this test can be used) for the duration of the COVID-19 declaration under Section 564(b)(1) of the Act, 21 U.S.C. section 360bbb-3(b)(1), unless the authorization is terminated or revoked.  Performed at [facility], [address] [place], [place] [address]    Labs: CBC: Recent Labs  Lab 12/03/22 2053 12/04/22 0615 12/06/22 0045 12/07/22 0136  WBC 6.3 6.1 6.1 7.0  NEUTROABS 3.8  --   --   --   HGB 9.4* 9.9* 9.5* 10.1*  HCT 30.7* 32.0* 32.8* 33.6*  MCV 81.9 81.2 85.0 83.2  PLT 156 167 147* [id number]   Basic Metabolic Panel: Recent Labs  Lab 12/03/22 2053 12/04/22 0615 12/06/22 0045 12/07/22 0136  NA 132* 134* 138 134*  K 3.7 3.6 3.6 3.5  CL 93* 90* 98 96*  CO2 30 32 34* 32  GLUCOSE 89 100* 95 94  BUN 6* 6* <5* 8  CREATININE 0.88 0.83  0.88 0.82 0.91  CALCIUM 9.3 9.0 8.4* 8.2*  MG  --   --  1.3* 1.6*   Liver Function Tests: Recent Labs  Lab 12/04/22 0137  AST 55*  ALT 29  ALKPHOS 88  BILITOT 0.7  PROT 8.5*  ALBUMIN 3.3*   CBG: No results for input(s): "GLUCAP" in the last 168 hours.  Discharge time spent: greater than 30 minutes.  Signed: Berle Mull, MD [facility] 12/07/2022

## 2022-12-11 DIAGNOSIS — G8929 Other chronic pain: Secondary | ICD-10-CM | POA: Diagnosis not present

## 2022-12-11 DIAGNOSIS — M533 Sacrococcygeal disorders, not elsewhere classified: Secondary | ICD-10-CM | POA: Diagnosis not present

## 2022-12-11 DIAGNOSIS — M545 Low back pain, unspecified: Secondary | ICD-10-CM | POA: Diagnosis not present

## 2022-12-16 ENCOUNTER — Ambulatory Visit (HOSPITAL_COMMUNITY): Admission: RE | Admit: 2022-12-16 | Payer: 59 | Source: Home / Self Care | Admitting: Gastroenterology

## 2022-12-16 SURGERY — ESOPHAGOGASTRODUODENOSCOPY (EGD) WITH PROPOFOL
Anesthesia: Monitor Anesthesia Care

## 2022-12-17 ENCOUNTER — Other Ambulatory Visit: Payer: Self-pay | Admitting: Gastroenterology

## 2022-12-17 ENCOUNTER — Encounter (HOSPITAL_COMMUNITY): Payer: Self-pay

## 2022-12-17 ENCOUNTER — Ambulatory Visit (HOSPITAL_COMMUNITY): Admit: 2022-12-17 | Payer: 59 | Admitting: Gastroenterology

## 2022-12-17 DIAGNOSIS — J449 Chronic obstructive pulmonary disease, unspecified: Secondary | ICD-10-CM | POA: Diagnosis not present

## 2022-12-17 SURGERY — EGD (ESOPHAGOGASTRODUODENOSCOPY)
Anesthesia: Monitor Anesthesia Care

## 2022-12-19 DIAGNOSIS — E1169 Type 2 diabetes mellitus with other specified complication: Secondary | ICD-10-CM | POA: Diagnosis not present

## 2022-12-19 DIAGNOSIS — R110 Nausea: Secondary | ICD-10-CM | POA: Diagnosis not present

## 2022-12-19 DIAGNOSIS — G894 Chronic pain syndrome: Secondary | ICD-10-CM | POA: Diagnosis not present

## 2022-12-19 DIAGNOSIS — K7581 Nonalcoholic steatohepatitis (NASH): Secondary | ICD-10-CM | POA: Diagnosis not present

## 2022-12-19 DIAGNOSIS — J449 Chronic obstructive pulmonary disease, unspecified: Secondary | ICD-10-CM | POA: Diagnosis not present

## 2022-12-19 DIAGNOSIS — I10 Essential (primary) hypertension: Secondary | ICD-10-CM | POA: Diagnosis not present

## 2022-12-19 DIAGNOSIS — G4733 Obstructive sleep apnea (adult) (pediatric): Secondary | ICD-10-CM | POA: Diagnosis not present

## 2022-12-19 DIAGNOSIS — E119 Type 2 diabetes mellitus without complications: Secondary | ICD-10-CM | POA: Diagnosis not present

## 2022-12-19 DIAGNOSIS — J45909 Unspecified asthma, uncomplicated: Secondary | ICD-10-CM | POA: Diagnosis not present

## 2022-12-25 DIAGNOSIS — M533 Sacrococcygeal disorders, not elsewhere classified: Secondary | ICD-10-CM | POA: Diagnosis not present

## 2022-12-26 DIAGNOSIS — K7460 Unspecified cirrhosis of liver: Secondary | ICD-10-CM | POA: Diagnosis not present

## 2022-12-26 DIAGNOSIS — Z79899 Other long term (current) drug therapy: Secondary | ICD-10-CM | POA: Diagnosis not present

## 2022-12-26 DIAGNOSIS — M545 Low back pain, unspecified: Secondary | ICD-10-CM | POA: Diagnosis not present

## 2022-12-26 DIAGNOSIS — M542 Cervicalgia: Secondary | ICD-10-CM | POA: Diagnosis not present

## 2022-12-26 DIAGNOSIS — R109 Unspecified abdominal pain: Secondary | ICD-10-CM | POA: Diagnosis not present

## 2022-12-26 DIAGNOSIS — G8929 Other chronic pain: Secondary | ICD-10-CM | POA: Diagnosis not present

## 2022-12-26 DIAGNOSIS — J449 Chronic obstructive pulmonary disease, unspecified: Secondary | ICD-10-CM | POA: Diagnosis not present

## 2022-12-30 DIAGNOSIS — Z79899 Other long term (current) drug therapy: Secondary | ICD-10-CM | POA: Diagnosis not present

## 2023-01-05 DIAGNOSIS — I2510 Atherosclerotic heart disease of native coronary artery without angina pectoris: Secondary | ICD-10-CM | POA: Diagnosis not present

## 2023-01-05 DIAGNOSIS — J9611 Chronic respiratory failure with hypoxia: Secondary | ICD-10-CM | POA: Diagnosis not present

## 2023-01-05 DIAGNOSIS — J9621 Acute and chronic respiratory failure with hypoxia: Secondary | ICD-10-CM | POA: Diagnosis not present

## 2023-01-05 DIAGNOSIS — J9622 Acute and chronic respiratory failure with hypercapnia: Secondary | ICD-10-CM | POA: Diagnosis not present

## 2023-01-06 DIAGNOSIS — J9622 Acute and chronic respiratory failure with hypercapnia: Secondary | ICD-10-CM | POA: Diagnosis not present

## 2023-01-06 DIAGNOSIS — J9621 Acute and chronic respiratory failure with hypoxia: Secondary | ICD-10-CM | POA: Diagnosis not present

## 2023-01-16 DIAGNOSIS — J449 Chronic obstructive pulmonary disease, unspecified: Secondary | ICD-10-CM | POA: Diagnosis not present

## 2023-01-21 DIAGNOSIS — K7460 Unspecified cirrhosis of liver: Secondary | ICD-10-CM | POA: Diagnosis not present

## 2023-01-21 DIAGNOSIS — Z79899 Other long term (current) drug therapy: Secondary | ICD-10-CM | POA: Diagnosis not present

## 2023-01-21 DIAGNOSIS — M542 Cervicalgia: Secondary | ICD-10-CM | POA: Diagnosis not present

## 2023-01-21 DIAGNOSIS — R109 Unspecified abdominal pain: Secondary | ICD-10-CM | POA: Diagnosis not present

## 2023-01-21 DIAGNOSIS — M545 Low back pain, unspecified: Secondary | ICD-10-CM | POA: Diagnosis not present

## 2023-01-21 DIAGNOSIS — J449 Chronic obstructive pulmonary disease, unspecified: Secondary | ICD-10-CM | POA: Diagnosis not present

## 2023-01-21 DIAGNOSIS — G8929 Other chronic pain: Secondary | ICD-10-CM | POA: Diagnosis not present

## 2023-01-21 DIAGNOSIS — M25561 Pain in right knee: Secondary | ICD-10-CM | POA: Diagnosis not present

## 2023-01-22 DIAGNOSIS — E119 Type 2 diabetes mellitus without complications: Secondary | ICD-10-CM | POA: Diagnosis not present

## 2023-01-22 DIAGNOSIS — K7581 Nonalcoholic steatohepatitis (NASH): Secondary | ICD-10-CM | POA: Diagnosis not present

## 2023-01-22 DIAGNOSIS — J301 Allergic rhinitis due to pollen: Secondary | ICD-10-CM | POA: Diagnosis not present

## 2023-01-22 DIAGNOSIS — D508 Other iron deficiency anemias: Secondary | ICD-10-CM | POA: Diagnosis not present

## 2023-01-22 DIAGNOSIS — J449 Chronic obstructive pulmonary disease, unspecified: Secondary | ICD-10-CM | POA: Diagnosis not present

## 2023-01-22 DIAGNOSIS — G894 Chronic pain syndrome: Secondary | ICD-10-CM | POA: Diagnosis not present

## 2023-01-22 DIAGNOSIS — R6889 Other general symptoms and signs: Secondary | ICD-10-CM | POA: Diagnosis not present

## 2023-01-22 DIAGNOSIS — I2510 Atherosclerotic heart disease of native coronary artery without angina pectoris: Secondary | ICD-10-CM | POA: Diagnosis not present

## 2023-01-22 DIAGNOSIS — I700 Atherosclerosis of aorta: Secondary | ICD-10-CM | POA: Diagnosis not present

## 2023-01-27 ENCOUNTER — Encounter (HOSPITAL_COMMUNITY): Payer: Self-pay | Admitting: Gastroenterology

## 2023-01-27 NOTE — Progress Notes (Signed)
[name]  Prep instructions- n/a   PCP-William Tiburcio Pea MD Cardiologist- Dr Mayford Knife  EKG-12/05/22 Echo-08/14/22 Cath-n/a Stress-2018 ICD/PM-n/a Blood thinner- n/a GLP-1-Ozempic 7 day hold  Hx: CAD, HTN, Asthma, COPD, Cirrhosis, DM, OSA. Recently went to ED for SOB, was admitted 3/20- 3/24 for acute resp failure. Since d/c patient states she isn't using 02 but is using cpap at night. Per patient, they said the resp failure was due to heart failure. I asked if she sees a pulm. MD and she said no, her primary manages everything, saw them 5/9 ( records in care everywhere but cannot view them). She stated she does see cardiology and last [facility] was in 2021 but she said she only sees them once/ every couple years. Currently she states her breathing is good, no cardiac symptoms.   Anesthesia Review: Yes

## 2023-02-03 ENCOUNTER — Encounter (HOSPITAL_COMMUNITY)
Admission: RE | Disposition: A | Discharge status: Home / Self Care | Payer: Self-pay | Source: Home / Self Care | Attending: Gastroenterology

## 2023-02-03 ENCOUNTER — Ambulatory Visit (HOSPITAL_BASED_OUTPATIENT_CLINIC_OR_DEPARTMENT_OTHER): Payer: 59 | Admitting: Anesthesiology

## 2023-02-03 ENCOUNTER — Ambulatory Visit (HOSPITAL_COMMUNITY): Payer: 59 | Admitting: Anesthesiology

## 2023-02-03 ENCOUNTER — Ambulatory Visit (HOSPITAL_COMMUNITY)
Admission: RE | Admit: 2023-02-03 | Discharge: 2023-02-03 | Disposition: A | Discharge status: Home / Self Care | Payer: 59 | Attending: Gastroenterology | Admitting: Gastroenterology

## 2023-02-03 ENCOUNTER — Other Ambulatory Visit: Payer: Self-pay

## 2023-02-03 DIAGNOSIS — K766 Portal hypertension: Secondary | ICD-10-CM | POA: Diagnosis not present

## 2023-02-03 DIAGNOSIS — I2510 Atherosclerotic heart disease of native coronary artery without angina pectoris: Secondary | ICD-10-CM

## 2023-02-03 DIAGNOSIS — J449 Chronic obstructive pulmonary disease, unspecified: Secondary | ICD-10-CM | POA: Insufficient documentation

## 2023-02-03 DIAGNOSIS — R1084 Generalized abdominal pain: Secondary | ICD-10-CM | POA: Insufficient documentation

## 2023-02-03 DIAGNOSIS — E119 Type 2 diabetes mellitus without complications: Secondary | ICD-10-CM | POA: Diagnosis not present

## 2023-02-03 DIAGNOSIS — K7460 Unspecified cirrhosis of liver: Secondary | ICD-10-CM

## 2023-02-03 DIAGNOSIS — G4733 Obstructive sleep apnea (adult) (pediatric): Secondary | ICD-10-CM | POA: Diagnosis not present

## 2023-02-03 DIAGNOSIS — K3189 Other diseases of stomach and duodenum: Secondary | ICD-10-CM | POA: Diagnosis not present

## 2023-02-03 DIAGNOSIS — F17200 Nicotine dependence, unspecified, uncomplicated: Secondary | ICD-10-CM | POA: Diagnosis not present

## 2023-02-03 DIAGNOSIS — I509 Heart failure, unspecified: Secondary | ICD-10-CM | POA: Diagnosis not present

## 2023-02-03 DIAGNOSIS — I110 Hypertensive heart disease with heart failure: Secondary | ICD-10-CM

## 2023-02-03 DIAGNOSIS — E1149 Type 2 diabetes mellitus with other diabetic neurological complication: Secondary | ICD-10-CM | POA: Diagnosis not present

## 2023-02-03 HISTORY — PX: ESOPHAGOGASTRODUODENOSCOPY (EGD) WITH PROPOFOL: SHX5813

## 2023-02-03 LAB — GLUCOSE, CAPILLARY
Glucose-Capillary: 64 mg/dL — ABNORMAL LOW (ref 70–99)
Glucose-Capillary: 124 mg/dL — ABNORMAL HIGH (ref 70–99)
Glucose-Capillary: 88 mg/dL (ref 70–99)

## 2023-02-03 SURGERY — ESOPHAGOGASTRODUODENOSCOPY (EGD) WITH PROPOFOL
Anesthesia: Monitor Anesthesia Care

## 2023-02-03 MED ORDER — FENTANYL CITRATE (PF) 250 MCG/5ML IJ SOLN
50.0000 ug | Freq: Once | INTRAMUSCULAR | Status: AC
  Administered 2023-02-03: 50 ug via INTRAVENOUS

## 2023-02-03 MED ORDER — DEXTROSE 50 % IV SOLN
INTRAVENOUS | Status: AC
  Filled 2023-02-03: qty 50

## 2023-02-03 MED ORDER — PHENYLEPHRINE HCL (PRESSORS) 10 MG/ML IV SOLN
INTRAVENOUS | Status: DC | PRN
  Administered 2023-02-03 (×2): 160 ug via INTRAVENOUS

## 2023-02-03 MED ORDER — LACTATED RINGERS IV SOLN
INTRAVENOUS | Status: DC
  Administered 2023-02-03: 1000 mL via INTRAVENOUS

## 2023-02-03 MED ORDER — LIDOCAINE HCL (CARDIAC) PF 100 MG/5ML IV SOSY
PREFILLED_SYRINGE | INTRAVENOUS | Status: DC | PRN
  Administered 2023-02-03: 60 mg via INTRAVENOUS

## 2023-02-03 MED ORDER — FENTANYL CITRATE (PF) 100 MCG/2ML IJ SOLN
INTRAMUSCULAR | Status: AC
  Filled 2023-02-03: qty 2

## 2023-02-03 MED ORDER — PROPOFOL 10 MG/ML IV BOLUS
INTRAVENOUS | Status: DC | PRN
  Administered 2023-02-03: 50 mg via INTRAVENOUS

## 2023-02-03 MED ORDER — SODIUM CHLORIDE 0.9 % IV SOLN: INTRAVENOUS | Status: DC

## 2023-02-03 MED ORDER — DEXTROSE 50 % IV SOLN
12.5000 g | INTRAVENOUS | Status: AC
  Administered 2023-02-03: 12.5 g via INTRAVENOUS

## 2023-02-03 MED ORDER — PROPOFOL 500 MG/50ML IV EMUL
INTRAVENOUS | Status: DC | PRN
  Administered 2023-02-03: 150 ug/kg/min via INTRAVENOUS

## 2023-02-03 SURGICAL SUPPLY — 15 items

## 2023-02-03 NOTE — Interval H&P Note (Signed)
History and Physical Interval Note:  02/03/2023 12:07 PM  [name]  has presented today for surgery, with the diagnosis of Cirrhosis/Abdominal pain.  The various methods of treatment have been discussed with the patient and family. After consideration of risks, benefits and other options for treatment, the patient has consented to  Procedure(s): ESOPHAGOGASTRODUODENOSCOPY (EGD) WITH PROPOFOL (N/A) as a surgical intervention.  The patient's history has been reviewed, patient examined, no change in status, stable for surgery.  I have reviewed the patient's chart and labs.  Questions were answered to the patient's satisfaction.     Shirley Friar

## 2023-02-03 NOTE — H&P (Signed)
Date of Initial H&P: 01/28/23  History reviewed, patient examined, no change in status, stable for surgery.

## 2023-02-03 NOTE — Discharge Instructions (Signed)

## 2023-02-03 NOTE — Anesthesia Postprocedure Evaluation (Signed)
Anesthesia Post Note  Patient: MALINDA MCCROSKEY  Procedure(s) Performed: ESOPHAGOGASTRODUODENOSCOPY (EGD) WITH PROPOFOL     Patient location during evaluation: Endoscopy Anesthesia Type: MAC Level of consciousness: oriented, awake and alert and awake Pain management: pain level controlled Vital Signs Assessment: post-procedure vital signs reviewed and stable Respiratory status: spontaneous breathing, nonlabored ventilation, respiratory function stable and patient connected to nasal cannula oxygen Cardiovascular status: blood pressure returned to baseline and stable Postop Assessment: no headache, no backache and no apparent nausea or vomiting Anesthetic complications: no   No notable events documented.  Last Vitals:  Vitals:   02/03/23 1240 02/03/23 1250  BP: (!) 96/41 (!) 104/47  Pulse: 72 77  Resp: 14 17  Temp:    SpO2: 97% 96%    Last Pain:  Vitals:   02/03/23 1250  TempSrc:   PainSc: 6                  Collene Schlichter

## 2023-02-03 NOTE — Transfer of Care (Signed)
Immediate Anesthesia Transfer of Care Note  Patient: [name]  Procedure(s) Performed: ESOPHAGOGASTRODUODENOSCOPY (EGD) WITH PROPOFOL  Patient Location: Short Stay  Anesthesia Type:MAC  Level of Consciousness: awake, alert , oriented, and patient cooperative  Airway & Oxygen Therapy: Patient Spontanous Breathing and Patient connected to face mask oxygen  Post-op Assessment: Report given to RN, Post -op Vital signs reviewed and stable, and Patient moving all extremities X 4  Post vital signs: Reviewed and stable  Last Vitals:  Vitals Value Taken Time  BP 109/47 02/03/23 1232  Temp    Pulse 70 02/03/23 1233  Resp 14 02/03/23 1233  SpO2 100 % 02/03/23 1233  Vitals shown include unvalidated device data.  Last Pain:  Vitals:   02/03/23 1201  TempSrc:   PainSc: 7          Complications: No notable events documented.

## 2023-02-03 NOTE — Anesthesia Preprocedure Evaluation (Signed)
Anesthesia Evaluation  Patient identified by MRN, date of birth, ID band Patient awake    Reviewed: Allergy & Precautions, NPO status , Patient's Chart, lab work & pertinent test results  History of Anesthesia Complications (+) PONV and history of anesthetic complications  Airway Mallampati: II  TM Distance: >3 FB Neck ROM: Full    Dental  (+) Dental Advisory Given, Edentulous Lower, Edentulous Upper   Pulmonary asthma , sleep apnea , COPD,  COPD inhaler, Current Smoker   Pulmonary exam normal breath sounds clear to auscultation       Cardiovascular hypertension, Pt. on medications + CAD and +CHF  Normal cardiovascular exam Rhythm:Regular Rate:Normal     Neuro/Psych  Headaches, Seizures -,  PSYCHIATRIC DISORDERS Anxiety Depression     Neuromuscular disease    GI/Hepatic ,GERD  Medicated,,(+) Cirrhosis         Endo/Other  diabetes, Type 2  Morbid obesity  Renal/GU negative Renal ROS     Musculoskeletal  (+) Arthritis ,    Abdominal   Peds  Hematology negative hematology ROS (+)   Anesthesia Other Findings Day of surgery medications reviewed with the patient.  Reproductive/Obstetrics                              Anesthesia Physical Anesthesia Plan  ASA: 3  Anesthesia Plan: MAC   Post-op Pain Management: Minimal or no pain anticipated   Induction: Intravenous  PONV Risk Score and Plan: 2 and TIVA and Treatment may vary due to age or medical condition  Airway Management Planned: Simple Face Mask and Natural Airway  Additional Equipment:   Intra-op Plan:   Post-operative Plan:   Informed Consent: I have reviewed the patients History and Physical, chart, labs and discussed the procedure including the risks, benefits and alternatives for the proposed anesthesia with the patient or authorized representative who has indicated his/her understanding and acceptance.     Dental  advisory given  Plan Discussed with: CRNA  Anesthesia Plan Comments:          Anesthesia Quick Evaluation

## 2023-02-03 NOTE — Op Note (Signed)
[facility] Patient Name: [name] Procedure Date: 02/03/2023 MRN: [id number] Attending MD: Shirley Friar , MD, [id number] Date of Birth: 1961/01/18 CSN: [id number] Age: 62 Admit Type: Inpatient Procedure:                Upper GI endoscopy Indications:              Generalized abdominal pain, Cirrhosis rule out                            esophageal varices Providers:                Shirley Friar, MD, Carlena Hurl RN, RN,                            Rozetta Nunnery, Technician Referring MD:             Johny Blamer Medicines:                Propofol per Anesthesia, Monitored Anesthesia Care Complications:            No immediate complications. Estimated Blood Loss:     Estimated blood loss: none. Procedure:                Pre-Anesthesia Assessment:                           - Prior to the procedure, a History and Physical                            was performed, and patient medications and                            allergies were reviewed. The patient's tolerance of                            previous anesthesia was also reviewed. The risks                            and benefits of the procedure and the sedation                            options and risks were discussed with the patient.                            All questions were answered, and informed consent                            was obtained. Prior Anticoagulants: The patient has                            taken no anticoagulant or antiplatelet agents. ASA                            Grade Assessment: III - A patient with severe  systemic disease. After reviewing the risks and                            benefits, the patient was deemed in satisfactory                            condition to undergo the procedure.                           After obtaining informed consent, the endoscope was                            passed under direct vision. Throughout the                             procedure, the patient's blood pressure, pulse, and                            oxygen saturations were monitored continuously. The                            [id number]-[id number] ([id number]) Olympus endoscope was introduced                            through the mouth, and advanced to the second part                            of duodenum. The upper GI endoscopy was                            accomplished without difficulty. The patient                            tolerated the procedure well. Scope In: Scope Out: Findings:      The examined esophagus was normal.      There is no endoscopic evidence of varices in the entire esophagus.      The Z-line was regular and was found 42 cm from the incisors.      A large amount of food (residue) was found in the entire examined       stomach.      Mild portal hypertensive gastropathy was found in the gastric fundus and       in the gastric body.      The examined duodenum was normal. Impression:               - Normal esophagus.                           - Z-line regular, 42 cm from the incisors.                           - A large amount of food (residue) in the stomach.                           - Portal hypertensive gastropathy.                           -  Normal examined duodenum.                           - No specimens collected.                           - Suspect delayed emptying of stomach                            (gastroparesis) with the amount of food particles                            in gastric lumen. Moderate Sedation:      N/A - MAC procedure Recommendation:           - Patient has a contact number available for                            emergencies. The signs and symptoms of potential                            delayed complications were discussed with the                            patient. Return to normal activities tomorrow.                            Written discharge instructions were provided to the                             patient.                           - Low sodium diet. Procedure Code(s):        --- Professional ---                           [phone], Esophagogastroduodenoscopy, flexible,                            transoral; diagnostic, including collection of                            specimen(s) by brushing or washing, when performed                            (separate procedure) Diagnosis Code(s):        --- Professional ---                           K74.60, Unspecified cirrhosis of liver                           R10.84, Generalized abdominal pain                           K76.6, Portal hypertension  K31.89, Other diseases of stomach and duodenum CPT copyright 2022 [employer]. All rights reserved. The codes documented in this report are preliminary and upon coder review may  be revised to meet current compliance requirements. Shirley Friar, MD 02/03/2023 12:35:16 PM This report has been signed electronically. Number of Addenda: 0

## 2023-02-04 DIAGNOSIS — J9622 Acute and chronic respiratory failure with hypercapnia: Secondary | ICD-10-CM | POA: Diagnosis not present

## 2023-02-04 DIAGNOSIS — I2510 Atherosclerotic heart disease of native coronary artery without angina pectoris: Secondary | ICD-10-CM | POA: Diagnosis not present

## 2023-02-04 DIAGNOSIS — J9611 Chronic respiratory failure with hypoxia: Secondary | ICD-10-CM | POA: Diagnosis not present

## 2023-02-04 DIAGNOSIS — J9621 Acute and chronic respiratory failure with hypoxia: Secondary | ICD-10-CM | POA: Diagnosis not present

## 2023-02-05 DIAGNOSIS — M7918 Myalgia, other site: Secondary | ICD-10-CM | POA: Diagnosis not present

## 2023-02-05 DIAGNOSIS — J9621 Acute and chronic respiratory failure with hypoxia: Secondary | ICD-10-CM | POA: Diagnosis not present

## 2023-02-05 DIAGNOSIS — M533 Sacrococcygeal disorders, not elsewhere classified: Secondary | ICD-10-CM | POA: Diagnosis not present

## 2023-02-05 DIAGNOSIS — G5701 Lesion of sciatic nerve, right lower limb: Secondary | ICD-10-CM | POA: Diagnosis not present

## 2023-02-05 DIAGNOSIS — G8929 Other chronic pain: Secondary | ICD-10-CM | POA: Diagnosis not present

## 2023-02-05 DIAGNOSIS — J9622 Acute and chronic respiratory failure with hypercapnia: Secondary | ICD-10-CM | POA: Diagnosis not present

## 2023-02-08 ENCOUNTER — Encounter (HOSPITAL_COMMUNITY): Payer: Self-pay | Admitting: Gastroenterology

## 2023-02-11 DIAGNOSIS — G5701 Lesion of sciatic nerve, right lower limb: Secondary | ICD-10-CM | POA: Diagnosis not present

## 2023-02-16 DIAGNOSIS — J449 Chronic obstructive pulmonary disease, unspecified: Secondary | ICD-10-CM | POA: Diagnosis not present

## 2023-02-20 DIAGNOSIS — M25561 Pain in right knee: Secondary | ICD-10-CM | POA: Diagnosis not present

## 2023-02-20 DIAGNOSIS — K7460 Unspecified cirrhosis of liver: Secondary | ICD-10-CM | POA: Diagnosis not present

## 2023-02-20 DIAGNOSIS — M545 Low back pain, unspecified: Secondary | ICD-10-CM | POA: Diagnosis not present

## 2023-02-20 DIAGNOSIS — R109 Unspecified abdominal pain: Secondary | ICD-10-CM | POA: Diagnosis not present

## 2023-02-20 DIAGNOSIS — G8929 Other chronic pain: Secondary | ICD-10-CM | POA: Diagnosis not present

## 2023-02-20 DIAGNOSIS — Z79899 Other long term (current) drug therapy: Secondary | ICD-10-CM | POA: Diagnosis not present

## 2023-02-20 DIAGNOSIS — M542 Cervicalgia: Secondary | ICD-10-CM | POA: Diagnosis not present

## 2023-02-20 DIAGNOSIS — J449 Chronic obstructive pulmonary disease, unspecified: Secondary | ICD-10-CM | POA: Diagnosis not present

## 2023-03-07 DIAGNOSIS — I2510 Atherosclerotic heart disease of native coronary artery without angina pectoris: Secondary | ICD-10-CM | POA: Diagnosis not present

## 2023-03-07 DIAGNOSIS — J9611 Chronic respiratory failure with hypoxia: Secondary | ICD-10-CM | POA: Diagnosis not present

## 2023-03-07 DIAGNOSIS — J9622 Acute and chronic respiratory failure with hypercapnia: Secondary | ICD-10-CM | POA: Diagnosis not present

## 2023-03-07 DIAGNOSIS — J9621 Acute and chronic respiratory failure with hypoxia: Secondary | ICD-10-CM | POA: Diagnosis not present

## 2023-03-08 DIAGNOSIS — J9621 Acute and chronic respiratory failure with hypoxia: Secondary | ICD-10-CM | POA: Diagnosis not present

## 2023-03-08 DIAGNOSIS — J9622 Acute and chronic respiratory failure with hypercapnia: Secondary | ICD-10-CM | POA: Diagnosis not present

## 2023-03-18 DIAGNOSIS — J449 Chronic obstructive pulmonary disease, unspecified: Secondary | ICD-10-CM | POA: Diagnosis not present

## 2023-03-20 DIAGNOSIS — M545 Low back pain, unspecified: Secondary | ICD-10-CM | POA: Diagnosis not present

## 2023-03-20 DIAGNOSIS — M542 Cervicalgia: Secondary | ICD-10-CM | POA: Diagnosis not present

## 2023-03-20 DIAGNOSIS — Z79899 Other long term (current) drug therapy: Secondary | ICD-10-CM | POA: Diagnosis not present

## 2023-03-20 DIAGNOSIS — G8929 Other chronic pain: Secondary | ICD-10-CM | POA: Diagnosis not present

## 2023-03-20 DIAGNOSIS — J449 Chronic obstructive pulmonary disease, unspecified: Secondary | ICD-10-CM | POA: Diagnosis not present

## 2023-03-20 DIAGNOSIS — K7460 Unspecified cirrhosis of liver: Secondary | ICD-10-CM | POA: Diagnosis not present

## 2023-03-20 DIAGNOSIS — R109 Unspecified abdominal pain: Secondary | ICD-10-CM | POA: Diagnosis not present

## 2023-04-06 DIAGNOSIS — J9622 Acute and chronic respiratory failure with hypercapnia: Secondary | ICD-10-CM | POA: Diagnosis not present

## 2023-04-06 DIAGNOSIS — J9611 Chronic respiratory failure with hypoxia: Secondary | ICD-10-CM | POA: Diagnosis not present

## 2023-04-06 DIAGNOSIS — J9621 Acute and chronic respiratory failure with hypoxia: Secondary | ICD-10-CM | POA: Diagnosis not present

## 2023-04-06 DIAGNOSIS — I2510 Atherosclerotic heart disease of native coronary artery without angina pectoris: Secondary | ICD-10-CM | POA: Diagnosis not present

## 2023-04-07 ENCOUNTER — Other Ambulatory Visit: Payer: Self-pay | Admitting: Family Medicine

## 2023-04-07 DIAGNOSIS — J9621 Acute and chronic respiratory failure with hypoxia: Secondary | ICD-10-CM | POA: Diagnosis not present

## 2023-04-07 DIAGNOSIS — J9622 Acute and chronic respiratory failure with hypercapnia: Secondary | ICD-10-CM | POA: Diagnosis not present

## 2023-04-07 DIAGNOSIS — N644 Mastodynia: Secondary | ICD-10-CM

## 2023-04-09 DIAGNOSIS — M7918 Myalgia, other site: Secondary | ICD-10-CM | POA: Diagnosis not present

## 2023-04-09 DIAGNOSIS — G5701 Lesion of sciatic nerve, right lower limb: Secondary | ICD-10-CM | POA: Diagnosis not present

## 2023-04-09 DIAGNOSIS — M533 Sacrococcygeal disorders, not elsewhere classified: Secondary | ICD-10-CM | POA: Diagnosis not present

## 2023-04-09 DIAGNOSIS — G8929 Other chronic pain: Secondary | ICD-10-CM | POA: Diagnosis not present

## 2023-04-17 DIAGNOSIS — G8929 Other chronic pain: Secondary | ICD-10-CM | POA: Diagnosis not present

## 2023-04-17 DIAGNOSIS — Z79899 Other long term (current) drug therapy: Secondary | ICD-10-CM | POA: Diagnosis not present

## 2023-04-17 DIAGNOSIS — M545 Low back pain, unspecified: Secondary | ICD-10-CM | POA: Diagnosis not present

## 2023-04-17 DIAGNOSIS — R109 Unspecified abdominal pain: Secondary | ICD-10-CM | POA: Diagnosis not present

## 2023-04-17 DIAGNOSIS — M542 Cervicalgia: Secondary | ICD-10-CM | POA: Diagnosis not present

## 2023-04-17 DIAGNOSIS — J449 Chronic obstructive pulmonary disease, unspecified: Secondary | ICD-10-CM | POA: Diagnosis not present

## 2023-04-17 DIAGNOSIS — K7460 Unspecified cirrhosis of liver: Secondary | ICD-10-CM | POA: Diagnosis not present

## 2023-04-18 DIAGNOSIS — J449 Chronic obstructive pulmonary disease, unspecified: Secondary | ICD-10-CM | POA: Diagnosis not present

## 2023-04-22 ENCOUNTER — Other Ambulatory Visit: Payer: 59

## 2023-05-07 DIAGNOSIS — J9622 Acute and chronic respiratory failure with hypercapnia: Secondary | ICD-10-CM | POA: Diagnosis not present

## 2023-05-07 DIAGNOSIS — I2510 Atherosclerotic heart disease of native coronary artery without angina pectoris: Secondary | ICD-10-CM | POA: Diagnosis not present

## 2023-05-07 DIAGNOSIS — J9621 Acute and chronic respiratory failure with hypoxia: Secondary | ICD-10-CM | POA: Diagnosis not present

## 2023-05-07 DIAGNOSIS — J9611 Chronic respiratory failure with hypoxia: Secondary | ICD-10-CM | POA: Diagnosis not present

## 2023-05-08 DIAGNOSIS — J9621 Acute and chronic respiratory failure with hypoxia: Secondary | ICD-10-CM | POA: Diagnosis not present

## 2023-05-08 DIAGNOSIS — J9622 Acute and chronic respiratory failure with hypercapnia: Secondary | ICD-10-CM | POA: Diagnosis not present

## 2023-05-12 ENCOUNTER — Other Ambulatory Visit: Payer: 59

## 2023-05-14 DIAGNOSIS — K7460 Unspecified cirrhosis of liver: Secondary | ICD-10-CM | POA: Diagnosis not present

## 2023-05-14 DIAGNOSIS — E119 Type 2 diabetes mellitus without complications: Secondary | ICD-10-CM | POA: Diagnosis not present

## 2023-05-14 DIAGNOSIS — J449 Chronic obstructive pulmonary disease, unspecified: Secondary | ICD-10-CM | POA: Diagnosis not present

## 2023-05-14 DIAGNOSIS — E1169 Type 2 diabetes mellitus with other specified complication: Secondary | ICD-10-CM | POA: Diagnosis not present

## 2023-05-14 DIAGNOSIS — I2510 Atherosclerotic heart disease of native coronary artery without angina pectoris: Secondary | ICD-10-CM | POA: Diagnosis not present

## 2023-05-14 DIAGNOSIS — I10 Essential (primary) hypertension: Secondary | ICD-10-CM | POA: Diagnosis not present

## 2023-05-14 DIAGNOSIS — K7581 Nonalcoholic steatohepatitis (NASH): Secondary | ICD-10-CM | POA: Diagnosis not present

## 2023-05-14 DIAGNOSIS — I700 Atherosclerosis of aorta: Secondary | ICD-10-CM | POA: Diagnosis not present

## 2023-05-15 DIAGNOSIS — G8929 Other chronic pain: Secondary | ICD-10-CM | POA: Diagnosis not present

## 2023-05-15 DIAGNOSIS — K7460 Unspecified cirrhosis of liver: Secondary | ICD-10-CM | POA: Diagnosis not present

## 2023-05-15 DIAGNOSIS — M545 Low back pain, unspecified: Secondary | ICD-10-CM | POA: Diagnosis not present

## 2023-05-15 DIAGNOSIS — J449 Chronic obstructive pulmonary disease, unspecified: Secondary | ICD-10-CM | POA: Diagnosis not present

## 2023-05-15 DIAGNOSIS — R109 Unspecified abdominal pain: Secondary | ICD-10-CM | POA: Diagnosis not present

## 2023-05-15 DIAGNOSIS — Z79899 Other long term (current) drug therapy: Secondary | ICD-10-CM | POA: Diagnosis not present

## 2023-05-15 DIAGNOSIS — M542 Cervicalgia: Secondary | ICD-10-CM | POA: Diagnosis not present

## 2023-05-19 DIAGNOSIS — J449 Chronic obstructive pulmonary disease, unspecified: Secondary | ICD-10-CM | POA: Diagnosis not present

## 2023-06-07 DIAGNOSIS — J9622 Acute and chronic respiratory failure with hypercapnia: Secondary | ICD-10-CM | POA: Diagnosis not present

## 2023-06-07 DIAGNOSIS — I2510 Atherosclerotic heart disease of native coronary artery without angina pectoris: Secondary | ICD-10-CM | POA: Diagnosis not present

## 2023-06-07 DIAGNOSIS — J9621 Acute and chronic respiratory failure with hypoxia: Secondary | ICD-10-CM | POA: Diagnosis not present

## 2023-06-07 DIAGNOSIS — J9611 Chronic respiratory failure with hypoxia: Secondary | ICD-10-CM | POA: Diagnosis not present

## 2023-06-08 ENCOUNTER — Other Ambulatory Visit (HOSPITAL_BASED_OUTPATIENT_CLINIC_OR_DEPARTMENT_OTHER): Payer: Self-pay

## 2023-06-08 DIAGNOSIS — J9622 Acute and chronic respiratory failure with hypercapnia: Secondary | ICD-10-CM | POA: Diagnosis not present

## 2023-06-08 DIAGNOSIS — J9621 Acute and chronic respiratory failure with hypoxia: Secondary | ICD-10-CM | POA: Diagnosis not present

## 2023-06-10 ENCOUNTER — Ambulatory Visit
Admission: RE | Admit: 2023-06-10 | Discharge: 2023-06-10 | Disposition: A | Discharge status: Home / Self Care | Payer: 59 | Source: Ambulatory Visit | Attending: Family Medicine | Admitting: Family Medicine

## 2023-06-10 DIAGNOSIS — N6321 Unspecified lump in the left breast, upper outer quadrant: Secondary | ICD-10-CM | POA: Diagnosis not present

## 2023-06-10 DIAGNOSIS — N6322 Unspecified lump in the left breast, upper inner quadrant: Secondary | ICD-10-CM | POA: Diagnosis not present

## 2023-06-10 DIAGNOSIS — N644 Mastodynia: Secondary | ICD-10-CM

## 2023-06-10 DIAGNOSIS — N6320 Unspecified lump in the left breast, unspecified quadrant: Secondary | ICD-10-CM | POA: Diagnosis not present

## 2023-06-10 DIAGNOSIS — N6310 Unspecified lump in the right breast, unspecified quadrant: Secondary | ICD-10-CM | POA: Diagnosis not present

## 2023-06-12 DIAGNOSIS — M545 Low back pain, unspecified: Secondary | ICD-10-CM | POA: Diagnosis not present

## 2023-06-12 DIAGNOSIS — G8929 Other chronic pain: Secondary | ICD-10-CM | POA: Diagnosis not present

## 2023-06-12 DIAGNOSIS — J449 Chronic obstructive pulmonary disease, unspecified: Secondary | ICD-10-CM | POA: Diagnosis not present

## 2023-06-12 DIAGNOSIS — M542 Cervicalgia: Secondary | ICD-10-CM | POA: Diagnosis not present

## 2023-06-12 DIAGNOSIS — R109 Unspecified abdominal pain: Secondary | ICD-10-CM | POA: Diagnosis not present

## 2023-06-12 DIAGNOSIS — K7460 Unspecified cirrhosis of liver: Secondary | ICD-10-CM | POA: Diagnosis not present

## 2023-06-12 DIAGNOSIS — Z79899 Other long term (current) drug therapy: Secondary | ICD-10-CM | POA: Diagnosis not present

## 2023-06-16 DIAGNOSIS — Z79899 Other long term (current) drug therapy: Secondary | ICD-10-CM | POA: Diagnosis not present

## 2023-06-29 ENCOUNTER — Other Ambulatory Visit: Payer: Self-pay

## 2023-06-29 ENCOUNTER — Emergency Department (HOSPITAL_BASED_OUTPATIENT_CLINIC_OR_DEPARTMENT_OTHER): Payer: 59

## 2023-06-29 ENCOUNTER — Emergency Department (HOSPITAL_BASED_OUTPATIENT_CLINIC_OR_DEPARTMENT_OTHER)
Admission: EM | Admit: 2023-06-29 | Discharge: 2023-06-29 | Disposition: A | Discharge status: Home / Self Care | Payer: 59 | Attending: Emergency Medicine | Admitting: Emergency Medicine

## 2023-06-29 ENCOUNTER — Encounter (HOSPITAL_BASED_OUTPATIENT_CLINIC_OR_DEPARTMENT_OTHER): Payer: Self-pay

## 2023-06-29 DIAGNOSIS — E119 Type 2 diabetes mellitus without complications: Secondary | ICD-10-CM | POA: Diagnosis not present

## 2023-06-29 DIAGNOSIS — I2510 Atherosclerotic heart disease of native coronary artery without angina pectoris: Secondary | ICD-10-CM | POA: Insufficient documentation

## 2023-06-29 DIAGNOSIS — I10 Essential (primary) hypertension: Secondary | ICD-10-CM | POA: Insufficient documentation

## 2023-06-29 DIAGNOSIS — G8929 Other chronic pain: Secondary | ICD-10-CM | POA: Insufficient documentation

## 2023-06-29 DIAGNOSIS — R519 Headache, unspecified: Secondary | ICD-10-CM | POA: Diagnosis not present

## 2023-06-29 DIAGNOSIS — J449 Chronic obstructive pulmonary disease, unspecified: Secondary | ICD-10-CM | POA: Diagnosis not present

## 2023-06-29 DIAGNOSIS — R1084 Generalized abdominal pain: Secondary | ICD-10-CM | POA: Insufficient documentation

## 2023-06-29 DIAGNOSIS — I6523 Occlusion and stenosis of bilateral carotid arteries: Secondary | ICD-10-CM | POA: Diagnosis not present

## 2023-06-29 LAB — CBC WITH DIFFERENTIAL/PLATELET
Abs Immature Granulocytes: 0.02 10*3/uL (ref 0.00–0.07)
Basophils Absolute: 0 10*3/uL (ref 0.0–0.1)
Basophils Relative: 0 %
Eosinophils Absolute: 0.2 10*3/uL (ref 0.0–0.5)
Eosinophils Relative: 3 %
HCT: 34 % — ABNORMAL LOW (ref 36.0–46.0)
Hemoglobin: 11 g/dL — ABNORMAL LOW (ref 12.0–15.0)
Immature Granulocytes: 0 %
Lymphocytes Relative: 30 %
Lymphs Abs: 1.7 10*3/uL (ref 0.7–4.0)
MCH: 27.3 pg (ref 26.0–34.0)
MCHC: 32.4 g/dL (ref 30.0–36.0)
MCV: 84.4 fL (ref 80.0–100.0)
Monocytes Absolute: 0.4 10*3/uL (ref 0.1–1.0)
Monocytes Relative: 7 %
Neutro Abs: 3.5 10*3/uL (ref 1.7–7.7)
Neutrophils Relative %: 60 %
Platelets: 128 10*3/uL — ABNORMAL LOW (ref 150–400)
RBC: 4.03 MIL/uL (ref 3.87–5.11)
RDW: 14.7 % (ref 11.5–15.5)
WBC: 5.8 10*3/uL (ref 4.0–10.5)
nRBC: 0 % (ref 0.0–0.2)

## 2023-06-29 LAB — COMPREHENSIVE METABOLIC PANEL
ALT: 8 U/L (ref 0–44)
AST: 18 U/L (ref 15–41)
Albumin: 3.4 g/dL — ABNORMAL LOW (ref 3.5–5.0)
Alkaline Phosphatase: 55 U/L (ref 38–126)
Anion gap: 6 (ref 5–15)
BUN: 6 mg/dL — ABNORMAL LOW (ref 8–23)
CO2: 33 mmol/L — ABNORMAL HIGH (ref 22–32)
Calcium: 8.3 mg/dL — ABNORMAL LOW (ref 8.9–10.3)
Chloride: 102 mmol/L (ref 98–111)
Creatinine, Ser: 0.69 mg/dL (ref 0.44–1.00)
GFR, Estimated: >60 mL/min (ref >60)
Glucose, Bld: 88 mg/dL (ref 70–99)
Potassium: 3.4 mmol/L — ABNORMAL LOW (ref 3.5–5.1)
Sodium: 141 mmol/L (ref 135–145)
Total Bilirubin: 0.6 mg/dL (ref 0.3–1.2)
Total Protein: 7.2 g/dL (ref 6.5–8.1)

## 2023-06-29 LAB — PROTIME-INR
INR: 1.3 — ABNORMAL HIGH (ref 0.8–1.2)
Prothrombin Time: 15.9 s — ABNORMAL HIGH (ref 11.4–15.2)

## 2023-06-29 MED ORDER — HYDROMORPHONE HCL 1 MG/ML IJ SOLN
1.0000 mg | Freq: Once | INTRAMUSCULAR | Status: AC
  Administered 2023-06-29: 1 mg via INTRAVENOUS
  Filled 2023-06-29: qty 1

## 2023-06-29 MED ORDER — PROCHLORPERAZINE EDISYLATE 10 MG/2ML IJ SOLN
5.0000 mg | Freq: Once | INTRAMUSCULAR | Status: AC
  Administered 2023-06-29: 5 mg via INTRAVENOUS
  Filled 2023-06-29: qty 2

## 2023-06-29 NOTE — Discharge Instructions (Signed)
Follow-up with your doctor.  He may need adjustment of your blood pressure medicines.

## 2023-06-29 NOTE — ED Triage Notes (Signed)
States having high BP with headache.  Pain to frontal head and at time top of head.  At home BP 200/100.  Also states has lump to right arm.

## 2023-06-29 NOTE — ED Notes (Signed)
Pt teaching provided on medications that may cause drowsiness. Pt instructed not to drive or operate heavy machinery while taking the prescribed medication. Pt verbalized understanding.   Pt provided discharge instructions and prescription information. Pt was given the opportunity to ask questions and questions were answered.

## 2023-06-29 NOTE — ED Provider Notes (Signed)
[place] EMERGENCY DEPARTMENT AT [facility] Provider Note   CSN: [id number] Arrival date & time: 06/29/23  1713     History  Chief Complaint  Patient presents with   Hypertension    [name] is a 62 y.o. female.   Hypertension  Patient with history of [name].  States she has a headache and high blood pressure.  States has had for the last few days.  States she feels like this when her blood pressure goes high.  Dull in the back of her head.  Worse with lying down.  Also states abdomen is more pain.  History of cirrhosis.  Has had previous infections.  No fevers.  States the 20 mg oxycodone she is on does not control the pain in her abdomen anymore.  No nausea or vomiting.  No visual changes.  No trauma.    Past Medical History:  Diagnosis Date   Abdominal pain 01/12/2019   Abdominal pain, generalized 07/10/2014   Acute on chronic respiratory failure with hypoxia ([name]) 10/18/2018   Anxiety    Arthritis    Ascites 05/01/2019   NALD   Asthma    CAD (coronary artery disease)    cath 2010 with 60-70% left Cx and normal LVF   Chronic low back pain with left-sided sciatica 10/15/2015   Chronic pain    Chronic prescription benzodiazepine use 05/01/2019   Chronic, continuous use of opioids 05/01/2019   Cirrhosis of liver not due to alcohol ([name]) 05/01/2019   Cirrhosis of liver: per CT 07/11/2014   Constipation by delayed colonic transit 12/09/2014   COPD (chronic obstructive pulmonary disease) ([name])    COPD exacerbation ([name]) 10/12/2016   Dehiscence of surgical wound left knee 03/16/2013   Depression    Depression with anxiety 01/11/2019   Deviated septum    Diabetes mellitus without complication ([name])    type 2   Generalized abdominal pain 12/09/2014   GERD (gastroesophageal reflux disease)    Headache(784.0)    migraines   Hepatic cirrhosis ([name]) 06/2014   HLD (hyperlipidemia) 01/11/2019   Hypercholesterolemia    Hypertension    Hypertension associated with diabetes  ([name])    Ileus ([name]) 07/11/2014   Influenza A 10/2018   Lobar pneumonia ([name])    Microcytic anemia 01/11/2019   Migraine headache 12/09/2014   Morbid obesity ([name]) 03/09/2013   Obesity    OSA (obstructive sleep apnea) 01/11/2019   Pars defect with spondylolisthesis 05/01/2019   Perforated bowel ([name])    PONV (postoperative nausea and vomiting)    Respiratory failure, acute-on-chronic ([name]) 12/11/2010   S/P left PF UKR 03/07/2013   Seizures ([name])    as a little girl 62 years old   Shortness of breath    Sleep apnea    dont wear bipap . lost weight   Spondylolisthesis, grade 2    Tobacco abuse    Type 2 diabetes mellitus with obesity ([name]) 01/11/2019    Home Medications Prior to Admission medications   Medication Sig Start Date End Date Taking? Authorizing Provider  acetaminophen (TYLENOL) 500 MG tablet Take 1,000 mg by mouth every 6 (six) hours as needed for mild pain.    [provider]  albuterol (PROVENTIL HFA;VENTOLIN HFA) 108 (90 BASE) MCG/ACT inhaler Inhale 2 puffs into the lungs every 6 (six) hours as needed for wheezing or shortness of breath.    [provider]  albuterol (PROVENTIL) (2.5 MG/3ML) 0.083% nebulizer solution Take 2.5 mg by nebulization every 6 (six)  hours as needed for wheezing or shortness of breath.    [provider]  ALPRAZolam Prudy Feeler) 1 MG tablet Take 1-2 mg by mouth See admin instructions. Take one tablet (1 mg) by mouth in the morning and two tablets (2 mg) in the evening.    [provider]  baclofen (LIORESAL) 10 MG tablet Take 1 tablet (10 mg total) by mouth 3 (three) times daily. Patient taking differently: Take 10 mg by mouth 3 (three) times daily as needed for muscle spasms. 12/07/22   Rolly Salter, MD  benazepril (LOTENSIN) 40 MG tablet Take 40 mg by mouth daily.    [provider]  Benzocaine-Resorcinol (VAGISIL EX) Apply 1 application topically daily.    [provider]  bisacodyl (DULCOLAX) 10 MG  suppository Place 1 suppository (10 mg total) rectally daily as needed for moderate constipation. Patient not taking: Reported on 12/11/2022 12/07/22   Rolly Salter, MD  budesonide (PULMICORT) 1 MG/2ML nebulizer solution Take 1 mg by nebulization daily as needed (shortness of breath). 04/08/21   [provider]  ciprofloxacin (CIPRO) 500 MG tablet Take 1 tablet (500 mg total) by mouth daily with breakfast. 08/18/22   Regalado, Belkys A, MD  cloNIDine (CATAPRES) 0.1 MG tablet Take 1 tablet (0.1 mg total) by mouth daily. 12/08/22   Rolly Salter, MD  cyclobenzaprine (FLEXERIL) 10 MG tablet Take 10 mg by mouth daily as needed for muscle spasms.    [provider]  dicyclomine (BENTYL) 10 MG capsule Take 1 capsule (10 mg total) by mouth 4 (four) times daily -  before meals and at bedtime. Patient not taking: Reported on 01/29/2023 12/07/22   Rolly Salter, MD  escitalopram (LEXAPRO) 20 MG tablet Take 20 mg by mouth daily.    [provider]  estradiol (ESTRACE) 2 MG tablet Take 2 mg by mouth daily.     [provider]  fluticasone (FLONASE) 50 MCG/ACT nasal spray Place 1 spray into both nostrils daily as needed for allergies.    [provider]  furosemide (LASIX) 40 MG tablet Take 1 tablet (40 mg total) by mouth daily. Take additional 40 mg when you have a weight gain of 3 lbs in 1 day or 5 lbs in 1 week. 12/08/22   Rolly Salter, MD  Glycerin-Polysorbate 80 (REFRESH DRY EYE THERAPY OP) Place 1 drop into both eyes daily as needed (dry eyes).    [provider]  lactulose (CHRONULAC) 10 GM/15ML solution Take 15 mLs by mouth 2 (two) times daily as needed for mild constipation. 10/22/22   [provider]  levocetirizine (XYZAL) 5 MG tablet Take 5 mg by mouth every evening.  09/16/19   [provider]  lidocaine (LIDODERM) 5 % Place 1 patch onto the skin daily. Remove & Discard patch within 12 hours or as directed by MD Patient taking  differently: Place 1 patch onto the skin daily as needed (pain). Remove & Discard patch within 12 hours or as directed by MD 11/07/22   Tegeler, Canary Brim, MD  lidocaine (XYLOCAINE) 5 % ointment Apply 1 application topically 2 (two) times daily as needed (hemorrhoids).    [provider]  linaclotide Karlene Einstein) 145 MCG CAPS capsule Take 1 capsule (145 mcg total) by mouth daily before breakfast. Patient not taking: Reported on 01/29/2023 12/08/22   Rolly Salter, MD  magnesium oxide (MAG-OX) 400 (240 Mg) MG tablet Take 1 tablet (400 mg total) by mouth 2 (two) times daily.  Patient not taking: Reported on 01/29/2023 12/07/22   Rolly Salter, MD  meloxicam (MOBIC) 15 MG tablet Take 15 mg by mouth daily.    [provider]  mupirocin ointment (BACTROBAN) 2 % Apply 1 Application topically 2 (two) times daily. Nose sore 10/27/22   [provider]  naloxone [facility]) 4 MG/0.1ML LIQD nasal spray kit Place 1 spray into the nose as needed (for overdose).     [provider]  nystatin (MYCOSTATIN/NYSTOP) powder Apply 1 g topically 4 (four) times daily as needed (irritation).    [provider]  ondansetron (ZOFRAN) 8 MG tablet Take 8 mg by mouth 2 (two) times daily as needed for nausea or vomiting.    [provider]  oxyCODONE (ROXICODONE) 15 MG immediate release tablet Take 1 tablet (15 mg total) by mouth every 8 (eight) hours as needed for pain. Patient taking differently: Take 15 mg by mouth every 4 (four) hours. Up to 6 tablets a day 03/29/21   Hollice Espy, MD  pantoprazole (PROTONIX) 40 MG tablet Take 40 mg by mouth 2 (two) times daily.     [provider]  polyethylene glycol (MIRALAX / GLYCOLAX) 17 g packet Take 17 g by mouth 2 (two) times daily. Patient taking differently: Take 17 g by mouth daily as needed for moderate constipation. 12/07/22   Rolly Salter, MD  prochlorperazine (COMPAZINE) 10 MG tablet Take 10 mg by mouth every 6  (six) hours as needed for nausea or vomiting.    [provider]  promethazine (PHENERGAN) 25 MG tablet Take 25 mg by mouth every 8 (eight) hours as needed for nausea or vomiting.    [provider]  rizatriptan (MAXALT-MLT) 10 MG disintegrating tablet Take 10 mg by mouth as needed for migraine. May repeat in 2 hours if needed Rizatriptan benzoate 10mg  tablet disintegrating    [provider]  Semaglutide, 1 MG/DOSE, (OZEMPIC, 1 MG/DOSE,) 4 MG/3ML SOPN Inject 1 mg into the skin every Monday.    [provider]  Sennosides (SENNA LAX PO) Take 25 mg by mouth 2 (two) times daily.    [provider]  Simethicone 180 MG CAPS Take 180 mg by mouth 2 (two) times daily as needed for flatulence.    [provider]  Simethicone 80 MG TABS Take 1 tablet (80 mg total) by mouth in the morning, at noon, in the evening, and at bedtime. Patient not taking: Reported on 12/11/2022 12/07/22   Rolly Salter, MD  simvastatin (ZOCOR) 20 MG tablet Take 20 mg by mouth at bedtime.    [provider]  topiramate (TOPAMAX) 50 MG tablet Take 50 mg by mouth daily as needed (migraines).    [provider]  traZODone (DESYREL) 50 MG tablet Take 100 mg by mouth at bedtime. 09/05/22   [provider]  triamcinolone cream (KENALOG) 0.1 % Apply 1 application topically 2 (two) times daily as needed (rash).    [provider]      Allergies    Spironolactone, Bactrim [sulfamethoxazole-trimethoprim], Gabapentin, Amoxicillin, Clindamycin/lincomycin, Doxycycline, Influenza virus vaccine, Linzess [linaclotide], Metronidazole, and Treximet [sumatriptan-naproxen sodium]    Review of Systems   Review of Systems  Physical Exam Updated Vital Signs BP (!) 180/92   Pulse 62   Temp 97.9 F (36.6 C) (Oral)   Resp 20   Ht 5\' 1"  (1.549 m)   Wt 98.1 kg   SpO2 96%   BMI 40.87 kg/m  Physical Exam Vitals  and nursing note reviewed.  HENT:     Head:  Atraumatic.  Eyes:     Pupils: Pupils are equal, round, and reactive to light.  Cardiovascular:     Rate and Rhythm: Regular rhythm.  Abdominal:     Tenderness: There is abdominal tenderness.     Comments: Somewhat diffuse tenderness.  No hernia palpated.  No rebound or guarding.  Musculoskeletal:     Cervical back: Neck supple.     Comments: Slight edema on the right antecubital area.  No erythema.  Slight tenderness over right bicep also.  Neurological:     Mental Status: She is alert and oriented to person, place, and time.     ED Results / Procedures / Treatments   Labs (all labs ordered are listed, but only abnormal results are displayed) Labs Reviewed  COMPREHENSIVE METABOLIC PANEL - Abnormal; Notable for the following components:      Result Value   Potassium 3.4 (*)    CO2 33 (*)    BUN 6 (*)    Calcium 8.3 (*)    Albumin 3.4 (*)    All other components within normal limits  CBC WITH DIFFERENTIAL/PLATELET - Abnormal; Notable for the following components:   Hemoglobin 11.0 (*)    HCT 34.0 (*)    Platelets 128 (*)    All other components within normal limits  PROTIME-INR - Abnormal; Notable for the following components:   Prothrombin Time 15.9 (*)    INR 1.3 (*)    All other components within normal limits    EKG None  Radiology CT Head Wo Contrast  Result Date: 06/29/2023 CLINICAL DATA:  Headache EXAM: CT HEAD WITHOUT CONTRAST TECHNIQUE: Contiguous axial images were obtained from the base of the skull through the vertex without intravenous contrast. RADIATION DOSE REDUCTION: This exam was performed according to the departmental dose-optimization program which includes automated exposure control, adjustment of the mA and/or kV according to patient size and/or use of iterative reconstruction technique. COMPARISON:  Head CT 05/20/2011 FINDINGS: Brain: No evidence of acute infarction, hemorrhage, hydrocephalus, extra-axial collection or mass lesion/mass effect.  Vascular: Atherosclerotic calcifications are present within the cavernous internal carotid arteries. Skull: Normal. Negative for fracture or focal lesion. Sinuses/Orbits: No acute finding. Other: None. IMPRESSION: No acute intracranial abnormality. Electronically Signed   By: Darliss Cheney M.D.   On: 06/29/2023 22:26    Procedures Procedures    Medications Ordered in ED Medications  prochlorperazine (COMPAZINE) injection 5 mg (5 mg Intravenous Given 06/29/23 1915)  HYDROmorphone (DILAUDID) injection 1 mg (1 mg Intravenous Given 06/29/23 2019)  HYDROmorphone (DILAUDID) injection 1 mg (1 mg Intravenous Given 06/29/23 2235)    ED Course/ Medical Decision Making/ A&P                                 Medical Decision Making Amount and/or Complexity of Data Reviewed Labs: ordered. Radiology: ordered.  Risk Prescription drug management.   Patient with headache and hypertension.  History of hypertension.  States she gets a headache when her blood pressure goes up.  Denies change in medications.  States abdomen pain is now uncontrolled.  On chronic pain meds.  Differential diagnosis does include nonspecific headache, blood pressure induced headache.  For now we will treat with Compazine.  Will also get head CT to further evaluate.  Will get basic blood work also evaluate abdomen.  Reviewed recent ultrasounds of abdomen.  Blood pressure has improved some.  I think and follow-up short-term with PCP.   Head CT reassuring.  Blood work reassuring.  Feeling better after treatment. Doubt intra-abdominal infection at this time.       Final Clinical Impression(s) / ED Diagnoses Final diagnoses:  Hypertension, unspecified type  Acute nonintractable headache, unspecified headache type  Chronic abdominal pain    Rx / DC Orders ED Discharge Orders     None         Benjiman Core, MD 06/29/23 2247

## 2023-06-30 DIAGNOSIS — I10 Essential (primary) hypertension: Secondary | ICD-10-CM | POA: Diagnosis not present

## 2023-07-07 DIAGNOSIS — J9621 Acute and chronic respiratory failure with hypoxia: Secondary | ICD-10-CM | POA: Diagnosis not present

## 2023-07-07 DIAGNOSIS — J9611 Chronic respiratory failure with hypoxia: Secondary | ICD-10-CM | POA: Diagnosis not present

## 2023-07-07 DIAGNOSIS — I2510 Atherosclerotic heart disease of native coronary artery without angina pectoris: Secondary | ICD-10-CM | POA: Diagnosis not present

## 2023-07-07 DIAGNOSIS — J9622 Acute and chronic respiratory failure with hypercapnia: Secondary | ICD-10-CM | POA: Diagnosis not present

## 2023-07-08 DIAGNOSIS — J9621 Acute and chronic respiratory failure with hypoxia: Secondary | ICD-10-CM | POA: Diagnosis not present

## 2023-07-08 DIAGNOSIS — J9622 Acute and chronic respiratory failure with hypercapnia: Secondary | ICD-10-CM | POA: Diagnosis not present

## 2023-07-10 DIAGNOSIS — J449 Chronic obstructive pulmonary disease, unspecified: Secondary | ICD-10-CM | POA: Diagnosis not present

## 2023-07-10 DIAGNOSIS — Z79899 Other long term (current) drug therapy: Secondary | ICD-10-CM | POA: Diagnosis not present

## 2023-07-10 DIAGNOSIS — M545 Low back pain, unspecified: Secondary | ICD-10-CM | POA: Diagnosis not present

## 2023-07-10 DIAGNOSIS — G8929 Other chronic pain: Secondary | ICD-10-CM | POA: Diagnosis not present

## 2023-07-10 DIAGNOSIS — R109 Unspecified abdominal pain: Secondary | ICD-10-CM | POA: Diagnosis not present

## 2023-07-10 DIAGNOSIS — M542 Cervicalgia: Secondary | ICD-10-CM | POA: Diagnosis not present

## 2023-07-10 DIAGNOSIS — K7460 Unspecified cirrhosis of liver: Secondary | ICD-10-CM | POA: Diagnosis not present

## 2023-07-13 ENCOUNTER — Other Ambulatory Visit (HOSPITAL_COMMUNITY): Payer: Self-pay | Admitting: Gastroenterology

## 2023-07-13 DIAGNOSIS — K7581 Nonalcoholic steatohepatitis (NASH): Secondary | ICD-10-CM | POA: Diagnosis not present

## 2023-07-13 DIAGNOSIS — R188 Other ascites: Secondary | ICD-10-CM

## 2023-07-13 DIAGNOSIS — K7460 Unspecified cirrhosis of liver: Secondary | ICD-10-CM | POA: Diagnosis not present

## 2023-07-15 DIAGNOSIS — H33312 Horseshoe tear of retina without detachment, left eye: Secondary | ICD-10-CM | POA: Diagnosis not present

## 2023-07-15 DIAGNOSIS — Z961 Presence of intraocular lens: Secondary | ICD-10-CM | POA: Diagnosis not present

## 2023-07-15 DIAGNOSIS — H52223 Regular astigmatism, bilateral: Secondary | ICD-10-CM | POA: Diagnosis not present

## 2023-07-15 DIAGNOSIS — H43393 Other vitreous opacities, bilateral: Secondary | ICD-10-CM | POA: Diagnosis not present

## 2023-07-15 DIAGNOSIS — H35033 Hypertensive retinopathy, bilateral: Secondary | ICD-10-CM | POA: Diagnosis not present

## 2023-07-15 DIAGNOSIS — D3132 Benign neoplasm of left choroid: Secondary | ICD-10-CM | POA: Diagnosis not present

## 2023-07-15 DIAGNOSIS — H524 Presbyopia: Secondary | ICD-10-CM | POA: Diagnosis not present

## 2023-07-15 DIAGNOSIS — E119 Type 2 diabetes mellitus without complications: Secondary | ICD-10-CM | POA: Diagnosis not present

## 2023-07-15 DIAGNOSIS — H5203 Hypermetropia, bilateral: Secondary | ICD-10-CM | POA: Diagnosis not present

## 2023-07-15 DIAGNOSIS — H43813 Vitreous degeneration, bilateral: Secondary | ICD-10-CM | POA: Diagnosis not present

## 2023-07-16 ENCOUNTER — Ambulatory Visit (HOSPITAL_COMMUNITY)
Admission: RE | Admit: 2023-07-16 | Discharge: 2023-07-16 | Disposition: A | Discharge status: Home / Self Care | Payer: 59 | Source: Ambulatory Visit | Attending: Gastroenterology | Admitting: Gastroenterology

## 2023-07-16 DIAGNOSIS — R188 Other ascites: Secondary | ICD-10-CM | POA: Insufficient documentation

## 2023-07-16 DIAGNOSIS — K7581 Nonalcoholic steatohepatitis (NASH): Secondary | ICD-10-CM | POA: Diagnosis not present

## 2023-07-16 DIAGNOSIS — K7460 Unspecified cirrhosis of liver: Secondary | ICD-10-CM | POA: Diagnosis not present

## 2023-07-16 HISTORY — PX: IR PARACENTESIS: IMG2679

## 2023-07-16 MED ORDER — LIDOCAINE HCL 1 % IJ SOLN
INTRAMUSCULAR | Status: AC
  Filled 2023-07-16: qty 20

## 2023-07-16 MED ORDER — LIDOCAINE HCL 1 % IJ SOLN
20.0000 mL | Freq: Once | INTRAMUSCULAR | Status: AC
  Administered 2023-07-16: 10 mL

## 2023-07-16 NOTE — Procedures (Signed)
PROCEDURE SUMMARY:  Successful US guided paracentesis from right lateral abdomen.  Yielded 1.6 liters of blood-tinged fluid.  No immediate complications.  Pt tolerated well.   Specimen was not sent for labs.  EBL < 5mL  Hoyt Koch PA-C 07/16/2023 10:46 AM

## 2023-07-18 ENCOUNTER — Emergency Department (HOSPITAL_BASED_OUTPATIENT_CLINIC_OR_DEPARTMENT_OTHER)
Admission: EM | Admit: 2023-07-18 | Discharge: 2023-07-18 | Disposition: A | Discharge status: Home / Self Care | Payer: 59 | Attending: Emergency Medicine | Admitting: Emergency Medicine

## 2023-07-18 ENCOUNTER — Encounter (HOSPITAL_BASED_OUTPATIENT_CLINIC_OR_DEPARTMENT_OTHER): Payer: Self-pay | Admitting: Emergency Medicine

## 2023-07-18 DIAGNOSIS — I2510 Atherosclerotic heart disease of native coronary artery without angina pectoris: Secondary | ICD-10-CM | POA: Diagnosis not present

## 2023-07-18 DIAGNOSIS — J449 Chronic obstructive pulmonary disease, unspecified: Secondary | ICD-10-CM | POA: Diagnosis not present

## 2023-07-18 DIAGNOSIS — R1012 Left upper quadrant pain: Secondary | ICD-10-CM | POA: Insufficient documentation

## 2023-07-18 DIAGNOSIS — Z79899 Other long term (current) drug therapy: Secondary | ICD-10-CM | POA: Insufficient documentation

## 2023-07-18 DIAGNOSIS — G8929 Other chronic pain: Secondary | ICD-10-CM | POA: Insufficient documentation

## 2023-07-18 DIAGNOSIS — E119 Type 2 diabetes mellitus without complications: Secondary | ICD-10-CM | POA: Insufficient documentation

## 2023-07-18 DIAGNOSIS — K219 Gastro-esophageal reflux disease without esophagitis: Secondary | ICD-10-CM | POA: Diagnosis not present

## 2023-07-18 DIAGNOSIS — Z794 Long term (current) use of insulin: Secondary | ICD-10-CM | POA: Diagnosis not present

## 2023-07-18 DIAGNOSIS — I10 Essential (primary) hypertension: Secondary | ICD-10-CM | POA: Diagnosis not present

## 2023-07-18 LAB — URINALYSIS, ROUTINE W REFLEX MICROSCOPIC
Bilirubin Urine: NEGATIVE
Glucose, UA: NEGATIVE mg/dL
Hgb urine dipstick: NEGATIVE
Ketones, ur: NEGATIVE mg/dL
Leukocytes,Ua: NEGATIVE
Nitrite: NEGATIVE
Protein, ur: NEGATIVE mg/dL
Specific Gravity, Urine: 1.009 (ref 1.005–1.030)
pH: 5 (ref 5.0–8.0)

## 2023-07-18 LAB — COMPREHENSIVE METABOLIC PANEL
ALT: 8 U/L (ref 0–44)
AST: 19 U/L (ref 15–41)
Albumin: 3.5 g/dL (ref 3.5–5.0)
Alkaline Phosphatase: 67 U/L (ref 38–126)
Anion gap: 8 (ref 5–15)
BUN: 15 mg/dL (ref 8–23)
CO2: 33 mmol/L — ABNORMAL HIGH (ref 22–32)
Calcium: 8.9 mg/dL (ref 8.9–10.3)
Chloride: 93 mmol/L — ABNORMAL LOW (ref 98–111)
Creatinine, Ser: 0.97 mg/dL (ref 0.44–1.00)
GFR, Estimated: >60 mL/min (ref >60)
Glucose, Bld: 125 mg/dL — ABNORMAL HIGH (ref 70–99)
Potassium: 3.3 mmol/L — ABNORMAL LOW (ref 3.5–5.1)
Sodium: 134 mmol/L — ABNORMAL LOW (ref 135–145)
Total Bilirubin: 0.6 mg/dL (ref 0.3–1.2)
Total Protein: 7.8 g/dL (ref 6.5–8.1)

## 2023-07-18 LAB — CBC
HCT: 37.7 % (ref 36.0–46.0)
Hemoglobin: 12.1 g/dL (ref 12.0–15.0)
MCH: 27.1 pg (ref 26.0–34.0)
MCHC: 32.1 g/dL (ref 30.0–36.0)
MCV: 84.3 fL (ref 80.0–100.0)
Platelets: 158 10*3/uL (ref 150–400)
RBC: 4.47 MIL/uL (ref 3.87–5.11)
RDW: 14.6 % (ref 11.5–15.5)
WBC: 8.5 10*3/uL (ref 4.0–10.5)
nRBC: 0 % (ref 0.0–0.2)

## 2023-07-18 LAB — LIPASE, BLOOD: Lipase: 43 U/L (ref 11–51)

## 2023-07-18 MED ORDER — HYDROMORPHONE HCL 1 MG/ML IJ SOLN
1.0000 mg | Freq: Once | INTRAMUSCULAR | Status: AC
  Administered 2023-07-18: 1 mg via INTRAVENOUS
  Filled 2023-07-18: qty 1

## 2023-07-18 NOTE — Discharge Instructions (Signed)
Follow up with your doctor for recheck if pain remains hard to control. Return to the ED with any unusual severe pain, fever, vomiting or new concern.

## 2023-07-18 NOTE — ED Provider Notes (Signed)
[place] EMERGENCY DEPARTMENT AT [facility] Provider Note   CSN: [id number] Arrival date & time: 07/18/23  2042     History  Chief Complaint  Patient presents with   Abdominal Pain    [name] is a 62 y.o. female.  Patient with history of nonalcoholic cirrhosis (NASH), chronic pain, HTN, GERD, COPD, CAD, T2DM, seizures, HLD, ascites with last paracentesis 10/28 Bosie Clos).  Presents to ED with c/o severe LUQ pain since last night, different from her chronic diffuse abdominal pain. No vomiting, fever, chest pain, SOB or cough.   The history is provided by the patient. No language interpreter was used.  Abdominal Pain      Home Medications Prior to Admission medications   Medication Sig Start Date End Date Taking? Authorizing Provider  acetaminophen (TYLENOL) 500 MG tablet Take 1,000 mg by mouth every 6 (six) hours as needed for mild pain.    [provider]  albuterol (PROVENTIL HFA;VENTOLIN HFA) 108 (90 BASE) MCG/ACT inhaler Inhale 2 puffs into the lungs every 6 (six) hours as needed for wheezing or shortness of breath.    [provider]  albuterol (PROVENTIL) (2.5 MG/3ML) 0.083% nebulizer solution Take 2.5 mg by nebulization every 6 (six) hours as needed for wheezing or shortness of breath.    [provider]  ALPRAZolam Prudy Feeler) 1 MG tablet Take 1-2 mg by mouth See admin instructions. Take one tablet (1 mg) by mouth in the morning and two tablets (2 mg) in the evening.    [provider]  baclofen (LIORESAL) 10 MG tablet Take 1 tablet (10 mg total) by mouth 3 (three) times daily. Patient taking differently: Take 10 mg by mouth 3 (three) times daily as needed for muscle spasms. 12/07/22   Rolly Salter, MD  benazepril (LOTENSIN) 40 MG tablet Take 40 mg by mouth daily.    [provider]  Benzocaine-Resorcinol (VAGISIL EX) Apply 1 application topically daily.    [provider]  bisacodyl (DULCOLAX) 10 MG  suppository Place 1 suppository (10 mg total) rectally daily as needed for moderate constipation. Patient not taking: Reported on 12/11/2022 12/07/22   Rolly Salter, MD  budesonide (PULMICORT) 1 MG/2ML nebulizer solution Take 1 mg by nebulization daily as needed (shortness of breath). 04/08/21   [provider]  ciprofloxacin (CIPRO) 500 MG tablet Take 1 tablet (500 mg total) by mouth daily with breakfast. 08/18/22   Regalado, Belkys A, MD  cloNIDine (CATAPRES) 0.1 MG tablet Take 1 tablet (0.1 mg total) by mouth daily. 12/08/22   Rolly Salter, MD  cyclobenzaprine (FLEXERIL) 10 MG tablet Take 10 mg by mouth daily as needed for muscle spasms.    [provider]  dicyclomine (BENTYL) 10 MG capsule Take 1 capsule (10 mg total) by mouth 4 (four) times daily -  before meals and at bedtime. Patient not taking: Reported on 01/29/2023 12/07/22   Rolly Salter, MD  escitalopram (LEXAPRO) 20 MG tablet Take 20 mg by mouth daily.    [provider]  estradiol (ESTRACE) 2 MG tablet Take 2 mg by mouth daily.     [provider]  fluticasone (FLONASE) 50 MCG/ACT nasal spray Place 1 spray into both nostrils daily as needed for allergies.    [provider]  furosemide (LASIX) 40 MG tablet Take 1 tablet (40 mg total) by mouth daily. Take additional 40 mg when you have a weight gain of 3 lbs in 1 day or 5 lbs in  1 week. 12/08/22   Rolly Salter, MD  Glycerin-Polysorbate 80 (REFRESH DRY EYE THERAPY OP) Place 1 drop into both eyes daily as needed (dry eyes).    [provider]  lactulose (CHRONULAC) 10 GM/15ML solution Take 15 mLs by mouth 2 (two) times daily as needed for mild constipation. 10/22/22   [provider]  levocetirizine (XYZAL) 5 MG tablet Take 5 mg by mouth every evening.  09/16/19   [provider]  lidocaine (LIDODERM) 5 % Place 1 patch onto the skin daily. Remove & Discard patch within 12 hours or as directed by MD Patient taking  differently: Place 1 patch onto the skin daily as needed (pain). Remove & Discard patch within 12 hours or as directed by MD 11/07/22   Tegeler, Canary Brim, MD  lidocaine (XYLOCAINE) 5 % ointment Apply 1 application topically 2 (two) times daily as needed (hemorrhoids).    [provider]  linaclotide Karlene Einstein) 145 MCG CAPS capsule Take 1 capsule (145 mcg total) by mouth daily before breakfast. Patient not taking: Reported on 01/29/2023 12/08/22   Rolly Salter, MD  magnesium oxide (MAG-OX) 400 (240 Mg) MG tablet Take 1 tablet (400 mg total) by mouth 2 (two) times daily. Patient not taking: Reported on 01/29/2023 12/07/22   Rolly Salter, MD  meloxicam (MOBIC) 15 MG tablet Take 15 mg by mouth daily.    [provider]  mupirocin ointment (BACTROBAN) 2 % Apply 1 Application topically 2 (two) times daily. Nose sore 10/27/22   [provider]  naloxone [facility]) 4 MG/0.1ML LIQD nasal spray kit Place 1 spray into the nose as needed (for overdose).     [provider]  nystatin (MYCOSTATIN/NYSTOP) powder Apply 1 g topically 4 (four) times daily as needed (irritation).    [provider]  ondansetron (ZOFRAN) 8 MG tablet Take 8 mg by mouth 2 (two) times daily as needed for nausea or vomiting.    [provider]  oxyCODONE (ROXICODONE) 15 MG immediate release tablet Take 1 tablet (15 mg total) by mouth every 8 (eight) hours as needed for pain. Patient taking differently: Take 15 mg by mouth every 4 (four) hours. Up to 6 tablets a day 03/29/21   Hollice Espy, MD  pantoprazole (PROTONIX) 40 MG tablet Take 40 mg by mouth 2 (two) times daily.     [provider]  polyethylene glycol (MIRALAX / GLYCOLAX) 17 g packet Take 17 g by mouth 2 (two) times daily. Patient taking differently: Take 17 g by mouth daily as needed for moderate constipation. 12/07/22   Rolly Salter, MD  prochlorperazine (COMPAZINE) 10 MG tablet Take 10 mg by mouth every 6  (six) hours as needed for nausea or vomiting.    [provider]  promethazine (PHENERGAN) 25 MG tablet Take 25 mg by mouth every 8 (eight) hours as needed for nausea or vomiting.    [provider]  rizatriptan (MAXALT-MLT) 10 MG disintegrating tablet Take 10 mg by mouth as needed for migraine. May repeat in 2 hours if needed Rizatriptan benzoate 10mg  tablet disintegrating    [provider]  Semaglutide, 1 MG/DOSE, (OZEMPIC, 1 MG/DOSE,) 4 MG/3ML SOPN Inject 1 mg into the skin every Monday.    [provider]  Sennosides (SENNA LAX PO) Take 25 mg by mouth 2 (two) times daily.    [provider]  Simethicone 180 MG CAPS Take 180 mg by mouth 2 (two) times daily as needed for flatulence.  [provider]  Simethicone 80 MG TABS Take 1 tablet (80 mg total) by mouth in the morning, at noon, in the evening, and at bedtime. Patient not taking: Reported on 12/11/2022 12/07/22   Rolly Salter, MD  simvastatin (ZOCOR) 20 MG tablet Take 20 mg by mouth at bedtime.    [provider]  topiramate (TOPAMAX) 50 MG tablet Take 50 mg by mouth daily as needed (migraines).    [provider]  traZODone (DESYREL) 50 MG tablet Take 100 mg by mouth at bedtime. 09/05/22   [provider]  triamcinolone cream (KENALOG) 0.1 % Apply 1 application topically 2 (two) times daily as needed (rash).    [provider]      Allergies    Spironolactone, Bactrim [sulfamethoxazole-trimethoprim], Gabapentin, Amoxicillin, Clindamycin/lincomycin, Doxycycline, Influenza virus vaccine, Linzess [linaclotide], Metronidazole, and Treximet [sumatriptan-naproxen sodium]    Review of Systems   Review of Systems  Gastrointestinal:  Positive for abdominal pain.    Physical Exam Updated Vital Signs BP 122/66 (BP Location: Right Arm)   Pulse 65   Temp 97.8 F (36.6 C)   Resp 20   SpO2 100%  Physical Exam Vitals and nursing note reviewed.   Constitutional:      General: She is not in acute distress.    Appearance: She is well-developed.  Cardiovascular:     Rate and Rhythm: Normal rate and regular rhythm.  Pulmonary:     Effort: Pulmonary effort is normal.     Breath sounds: No wheezing, rhonchi or rales.  Abdominal:     General: Abdomen is protuberant. Bowel sounds are normal.     Palpations: Abdomen is soft. There is no fluid wave.     Tenderness: There is generalized abdominal tenderness. There is no guarding.  Skin:    General: Skin is warm and dry.  Neurological:     Mental Status: She is alert and oriented to person, place, and time.     ED Results / Procedures / Treatments   Labs (all labs ordered are listed, but only abnormal results are displayed) Labs Reviewed  COMPREHENSIVE METABOLIC PANEL - Abnormal; Notable for the following components:      Result Value   Sodium 134 (*)    Potassium 3.3 (*)    Chloride 93 (*)    CO2 33 (*)    Glucose, Bld 125 (*)    All other components within normal limits  LIPASE, BLOOD  CBC  URINALYSIS, ROUTINE W REFLEX MICROSCOPIC   Results for orders placed or performed during the hospital encounter of 07/18/23  Lipase, blood  Result Value Ref Range   Lipase 43 11 - 51 U/L  Comprehensive metabolic panel  Result Value Ref Range   Sodium 134 (L) 135 - 145 mmol/L   Potassium 3.3 (L) 3.5 - 5.1 mmol/L   Chloride 93 (L) 98 - 111 mmol/L   CO2 33 (H) 22 - 32 mmol/L   Glucose, Bld 125 (H) 70 - 99 mg/dL   BUN 15 8 - 23 mg/dL   Creatinine, Ser 1.61 0.44 - 1.00 mg/dL   Calcium 8.9 8.9 - 09.6 mg/dL   Total Protein 7.8 6.5 - 8.1 g/dL   Albumin 3.5 3.5 - 5.0 g/dL   AST 19 15 - 41 U/L   ALT 8 0 - 44 U/L   Alkaline Phosphatase 67 38 - 126 U/L   Total Bilirubin 0.6 0.3 - 1.2 mg/dL   GFR, Estimated >04 >54 mL/min   Anion  gap 8 5 - 15  CBC  Result Value Ref Range   WBC 8.5 4.0 - 10.5 K/uL   RBC 4.47 3.87 - 5.11 MIL/uL   Hemoglobin 12.1 12.0 - 15.0 g/dL   HCT 24.4 01.0 -  27.2 %   MCV 84.3 80.0 - 100.0 fL   MCH 27.1 26.0 - 34.0 pg   MCHC 32.1 30.0 - 36.0 g/dL   RDW 53.6 64.4 - 03.4 %   Platelets 158 150 - 400 K/uL   nRBC 0.0 0.0 - 0.2 %  Urinalysis, Routine w reflex microscopic -Urine, Clean Catch  Result Value Ref Range   Color, Urine YELLOW YELLOW   APPearance CLEAR CLEAR   Specific Gravity, Urine 1.009 1.005 - 1.030   pH 5.0 5.0 - 8.0   Glucose, UA NEGATIVE NEGATIVE mg/dL   Hgb urine dipstick NEGATIVE NEGATIVE   Bilirubin Urine NEGATIVE NEGATIVE   Ketones, ur NEGATIVE NEGATIVE mg/dL   Protein, ur NEGATIVE NEGATIVE mg/dL   Nitrite NEGATIVE NEGATIVE   Leukocytes,Ua NEGATIVE NEGATIVE     EKG None  Radiology No results found.  Procedures Procedures    Medications Ordered in ED Medications  HYDROmorphone (DILAUDID) injection 1 mg (1 mg Intravenous Given 07/18/23 2131)  HYDROmorphone (DILAUDID) injection 1 mg (1 mg Intravenous Given 07/18/23 2305)    ED Course/ Medical Decision Making/ A&P Clinical Course as of 07/18/23 2331  Sat Jul 18, 2023  2243 Patient with history of ascites, cirrhosis (NASH), chronic pain here with increased abdominal pain. No fever, vomiting. Labs reviewed and are essentially baseline when compared to previous lab studies. No evidence of acute process.  [SU]  2328 Pain medication provided in ED. On re-examination, the patient is feeling much better. No indication for imaging tonight. She is comfortable with discharge home and plans to call her doctor Monday for follow up. VSS. Appropriate for discharge.  [SU]    Clinical Course User Index [SU] Elpidio Anis, PA-C                                 Medical Decision Making Amount and/or Complexity of Data Reviewed Labs: ordered.  Risk Prescription drug management.           Final Clinical Impression(s) / ED Diagnoses Final diagnoses:  Left upper quadrant abdominal pain  Chronic abdominal pain    Rx / DC Orders ED Discharge Orders     None          Danne Harbor 07/18/23 2331    Benjiman Core, MD 07/20/23 1536

## 2023-07-18 NOTE — ED Triage Notes (Signed)
Abdo pain. LLQ  Recent paracentesis.

## 2023-07-19 DIAGNOSIS — R1012 Left upper quadrant pain: Secondary | ICD-10-CM | POA: Diagnosis not present

## 2023-08-05 DIAGNOSIS — Z0000 Encounter for general adult medical examination without abnormal findings: Secondary | ICD-10-CM | POA: Diagnosis not present

## 2023-08-05 DIAGNOSIS — I10 Essential (primary) hypertension: Secondary | ICD-10-CM | POA: Diagnosis not present

## 2023-08-05 DIAGNOSIS — Z9989 Dependence on other enabling machines and devices: Secondary | ICD-10-CM | POA: Diagnosis not present

## 2023-08-05 DIAGNOSIS — I700 Atherosclerosis of aorta: Secondary | ICD-10-CM | POA: Diagnosis not present

## 2023-08-05 DIAGNOSIS — J449 Chronic obstructive pulmonary disease, unspecified: Secondary | ICD-10-CM | POA: Diagnosis not present

## 2023-08-05 DIAGNOSIS — E119 Type 2 diabetes mellitus without complications: Secondary | ICD-10-CM | POA: Diagnosis not present

## 2023-08-05 DIAGNOSIS — K7460 Unspecified cirrhosis of liver: Secondary | ICD-10-CM | POA: Diagnosis not present

## 2023-08-05 DIAGNOSIS — Z9181 History of falling: Secondary | ICD-10-CM | POA: Diagnosis not present

## 2023-08-06 DIAGNOSIS — R208 Other disturbances of skin sensation: Secondary | ICD-10-CM | POA: Diagnosis not present

## 2023-08-06 DIAGNOSIS — L821 Other seborrheic keratosis: Secondary | ICD-10-CM | POA: Diagnosis not present

## 2023-08-06 DIAGNOSIS — I781 Nevus, non-neoplastic: Secondary | ICD-10-CM | POA: Diagnosis not present

## 2023-08-07 DIAGNOSIS — R109 Unspecified abdominal pain: Secondary | ICD-10-CM | POA: Diagnosis not present

## 2023-08-07 DIAGNOSIS — J9621 Acute and chronic respiratory failure with hypoxia: Secondary | ICD-10-CM | POA: Diagnosis not present

## 2023-08-07 DIAGNOSIS — G8929 Other chronic pain: Secondary | ICD-10-CM | POA: Diagnosis not present

## 2023-08-07 DIAGNOSIS — M545 Low back pain, unspecified: Secondary | ICD-10-CM | POA: Diagnosis not present

## 2023-08-07 DIAGNOSIS — I2510 Atherosclerotic heart disease of native coronary artery without angina pectoris: Secondary | ICD-10-CM | POA: Diagnosis not present

## 2023-08-07 DIAGNOSIS — M2550 Pain in unspecified joint: Secondary | ICD-10-CM | POA: Diagnosis not present

## 2023-08-07 DIAGNOSIS — J9622 Acute and chronic respiratory failure with hypercapnia: Secondary | ICD-10-CM | POA: Diagnosis not present

## 2023-08-07 DIAGNOSIS — K7460 Unspecified cirrhosis of liver: Secondary | ICD-10-CM | POA: Diagnosis not present

## 2023-08-07 DIAGNOSIS — Z79899 Other long term (current) drug therapy: Secondary | ICD-10-CM | POA: Diagnosis not present

## 2023-08-07 DIAGNOSIS — J449 Chronic obstructive pulmonary disease, unspecified: Secondary | ICD-10-CM | POA: Diagnosis not present

## 2023-08-07 DIAGNOSIS — M542 Cervicalgia: Secondary | ICD-10-CM | POA: Diagnosis not present

## 2023-08-07 DIAGNOSIS — J9611 Chronic respiratory failure with hypoxia: Secondary | ICD-10-CM | POA: Diagnosis not present

## 2023-08-08 DIAGNOSIS — J9622 Acute and chronic respiratory failure with hypercapnia: Secondary | ICD-10-CM | POA: Diagnosis not present

## 2023-08-08 DIAGNOSIS — J9621 Acute and chronic respiratory failure with hypoxia: Secondary | ICD-10-CM | POA: Diagnosis not present

## 2023-08-10 DIAGNOSIS — Z79899 Other long term (current) drug therapy: Secondary | ICD-10-CM | POA: Diagnosis not present

## 2023-09-04 DIAGNOSIS — M545 Low back pain, unspecified: Secondary | ICD-10-CM | POA: Diagnosis not present

## 2023-09-04 DIAGNOSIS — J449 Chronic obstructive pulmonary disease, unspecified: Secondary | ICD-10-CM | POA: Diagnosis not present

## 2023-09-04 DIAGNOSIS — R109 Unspecified abdominal pain: Secondary | ICD-10-CM | POA: Diagnosis not present

## 2023-09-04 DIAGNOSIS — M2550 Pain in unspecified joint: Secondary | ICD-10-CM | POA: Diagnosis not present

## 2023-09-04 DIAGNOSIS — G8929 Other chronic pain: Secondary | ICD-10-CM | POA: Diagnosis not present

## 2023-09-04 DIAGNOSIS — Z79899 Other long term (current) drug therapy: Secondary | ICD-10-CM | POA: Diagnosis not present

## 2023-09-04 DIAGNOSIS — K7460 Unspecified cirrhosis of liver: Secondary | ICD-10-CM | POA: Diagnosis not present

## 2023-09-04 DIAGNOSIS — M542 Cervicalgia: Secondary | ICD-10-CM | POA: Diagnosis not present

## 2023-09-06 DIAGNOSIS — J9611 Chronic respiratory failure with hypoxia: Secondary | ICD-10-CM | POA: Diagnosis not present

## 2023-09-06 DIAGNOSIS — I2510 Atherosclerotic heart disease of native coronary artery without angina pectoris: Secondary | ICD-10-CM | POA: Diagnosis not present

## 2023-09-06 DIAGNOSIS — J9621 Acute and chronic respiratory failure with hypoxia: Secondary | ICD-10-CM | POA: Diagnosis not present

## 2023-09-06 DIAGNOSIS — J9622 Acute and chronic respiratory failure with hypercapnia: Secondary | ICD-10-CM | POA: Diagnosis not present

## 2023-09-11 ENCOUNTER — Encounter (HOSPITAL_BASED_OUTPATIENT_CLINIC_OR_DEPARTMENT_OTHER): Payer: Self-pay | Admitting: Emergency Medicine

## 2023-09-11 ENCOUNTER — Emergency Department (HOSPITAL_BASED_OUTPATIENT_CLINIC_OR_DEPARTMENT_OTHER)
Admission: EM | Admit: 2023-09-11 | Discharge: 2023-09-11 | Disposition: A | Discharge status: Home / Self Care | Payer: 59 | Attending: Emergency Medicine | Admitting: Emergency Medicine

## 2023-09-11 ENCOUNTER — Other Ambulatory Visit: Payer: Self-pay

## 2023-09-11 DIAGNOSIS — M545 Low back pain, unspecified: Secondary | ICD-10-CM | POA: Insufficient documentation

## 2023-09-11 DIAGNOSIS — R188 Other ascites: Secondary | ICD-10-CM | POA: Insufficient documentation

## 2023-09-11 DIAGNOSIS — R1084 Generalized abdominal pain: Secondary | ICD-10-CM | POA: Diagnosis not present

## 2023-09-11 LAB — COMPREHENSIVE METABOLIC PANEL
ALT: 6 U/L (ref 0–44)
AST: 19 U/L (ref 15–41)
Albumin: 3.3 g/dL — ABNORMAL LOW (ref 3.5–5.0)
Alkaline Phosphatase: 75 U/L (ref 38–126)
Anion gap: 6 (ref 5–15)
BUN: 6 mg/dL — ABNORMAL LOW (ref 8–23)
CO2: 30 mmol/L (ref 22–32)
Calcium: 8.4 mg/dL — ABNORMAL LOW (ref 8.9–10.3)
Chloride: 104 mmol/L (ref 98–111)
Creatinine, Ser: 0.81 mg/dL (ref 0.44–1.00)
GFR, Estimated: >60 mL/min (ref >60)
Glucose, Bld: 100 mg/dL — ABNORMAL HIGH (ref 70–99)
Potassium: 3.6 mmol/L (ref 3.5–5.1)
Sodium: 140 mmol/L (ref 135–145)
Total Bilirubin: 0.4 mg/dL (ref <1.2)
Total Protein: 7.7 g/dL (ref 6.5–8.1)

## 2023-09-11 LAB — CBC
HCT: 35.7 % — ABNORMAL LOW (ref 36.0–46.0)
Hemoglobin: 11.3 g/dL — ABNORMAL LOW (ref 12.0–15.0)
MCH: 26.7 pg (ref 26.0–34.0)
MCHC: 31.7 g/dL (ref 30.0–36.0)
MCV: 84.2 fL (ref 80.0–100.0)
Platelets: 131 10*3/uL — ABNORMAL LOW (ref 150–400)
RBC: 4.24 MIL/uL (ref 3.87–5.11)
RDW: 14.4 % (ref 11.5–15.5)
WBC: 5.9 10*3/uL (ref 4.0–10.5)
nRBC: 0 % (ref 0.0–0.2)

## 2023-09-11 LAB — URINALYSIS, ROUTINE W REFLEX MICROSCOPIC
Bilirubin Urine: NEGATIVE
Glucose, UA: NEGATIVE mg/dL
Hgb urine dipstick: NEGATIVE
Ketones, ur: NEGATIVE mg/dL
Leukocytes,Ua: NEGATIVE
Nitrite: NEGATIVE
Protein, ur: NEGATIVE mg/dL
Specific Gravity, Urine: <1.005 — ABNORMAL LOW (ref 1.005–1.030)
pH: 6 (ref 5.0–8.0)

## 2023-09-11 LAB — LIPASE, BLOOD: Lipase: 38 U/L (ref 11–51)

## 2023-09-11 MED ORDER — MORPHINE SULFATE (PF) 4 MG/ML IV SOLN
6.0000 mg | Freq: Once | INTRAVENOUS | Status: AC
  Administered 2023-09-11: 6 mg via INTRAVENOUS
  Filled 2023-09-11: qty 2

## 2023-09-11 NOTE — ED Provider Notes (Signed)
[place] EMERGENCY DEPARTMENT AT [facility] [place] Provider Note   CSN: [id number] Arrival date & time: 09/11/23  1631     History  Chief Complaint  Patient presents with   Abdominal Pain    [name] is a 62 y.o. female.  Patient is a 62 year old female with previous diagnosis of NASH as well as liver cirrhosis with ascites with previous therapeutic abdominal paracentesis presenting for complaints of generalized abdominal pain and low back pain. States since then she has had slowly progressive abdominal fullness. Admits to new severe pain on bilateral abdominal sides with associated back pain. Patient also admits to 13 pound weight gain in the last 28 days.  Progressing abdominal fullness is uncomfortable and she has not been Since October.  The history is provided by the patient. No language interpreter was used.  Abdominal Pain Associated symptoms: no chest pain, no chills, no cough, no dysuria, no fever, no hematuria, no shortness of breath, no sore throat and no vomiting        Home Medications Prior to Admission medications   Medication Sig Start Date End Date Taking? Authorizing Provider  acetaminophen (TYLENOL) 500 MG tablet Take 1,000 mg by mouth every 6 (six) hours as needed for mild pain.    [provider]  albuterol (PROVENTIL HFA;VENTOLIN HFA) 108 (90 BASE) MCG/ACT inhaler Inhale 2 puffs into the lungs every 6 (six) hours as needed for wheezing or shortness of breath.    [provider]  albuterol (PROVENTIL) (2.5 MG/3ML) 0.083% nebulizer solution Take 2.5 mg by nebulization every 6 (six) hours as needed for wheezing or shortness of breath.    [provider]  ALPRAZolam Prudy Feeler) 1 MG tablet Take 1-2 mg by mouth See admin instructions. Take one tablet (1 mg) by mouth in the morning and two tablets (2 mg) in the evening.    [provider]  baclofen (LIORESAL) 10 MG tablet Take 1 tablet (10 mg total) by mouth 3 (three) times  daily. Patient taking differently: Take 10 mg by mouth 3 (three) times daily as needed for muscle spasms. 12/07/22   Rolly Salter, MD  benazepril (LOTENSIN) 40 MG tablet Take 40 mg by mouth daily.    [provider]  Benzocaine-Resorcinol (VAGISIL EX) Apply 1 application topically daily.    [provider]  bisacodyl (DULCOLAX) 10 MG suppository Place 1 suppository (10 mg total) rectally daily as needed for moderate constipation. Patient not taking: Reported on 12/11/2022 12/07/22   Rolly Salter, MD  budesonide (PULMICORT) 1 MG/2ML nebulizer solution Take 1 mg by nebulization daily as needed (shortness of breath). 04/08/21   [provider]  ciprofloxacin (CIPRO) 500 MG tablet Take 1 tablet (500 mg total) by mouth daily with breakfast. 08/18/22   Regalado, Belkys A, MD  cloNIDine (CATAPRES) 0.1 MG tablet Take 1 tablet (0.1 mg total) by mouth daily. 12/08/22   Rolly Salter, MD  cyclobenzaprine (FLEXERIL) 10 MG tablet Take 10 mg by mouth daily as needed for muscle spasms.    [provider]  dicyclomine (BENTYL) 10 MG capsule Take 1 capsule (10 mg total) by mouth 4 (four) times daily -  before meals and at bedtime. Patient not taking: Reported on 01/29/2023 12/07/22   Rolly Salter, MD  escitalopram (LEXAPRO) 20 MG tablet Take 20 mg by mouth daily.    [provider]  estradiol (ESTRACE) 2 MG tablet Take 2 mg by mouth daily.     [provider]  fluticasone (FLONASE) 50 MCG/ACT nasal spray Place 1 spray into both nostrils daily as needed for allergies.    [provider]  furosemide (LASIX) 40 MG tablet Take 1 tablet (40 mg total) by mouth daily. Take additional 40 mg when you have a weight gain of 3 lbs in 1 day or 5 lbs in 1 week. 12/08/22   Rolly Salter, MD  Glycerin-Polysorbate 80 (REFRESH DRY EYE THERAPY OP) Place 1 drop into both eyes daily as needed (dry eyes).    [provider]  lactulose (CHRONULAC) 10 GM/15ML  solution Take 15 mLs by mouth 2 (two) times daily as needed for mild constipation. 10/22/22   [provider]  levocetirizine (XYZAL) 5 MG tablet Take 5 mg by mouth every evening.  09/16/19   [provider]  lidocaine (LIDODERM) 5 % Place 1 patch onto the skin daily. Remove & Discard patch within 12 hours or as directed by MD Patient taking differently: Place 1 patch onto the skin daily as needed (pain). Remove & Discard patch within 12 hours or as directed by MD 11/07/22   Tegeler, Canary Brim, MD  lidocaine (XYLOCAINE) 5 % ointment Apply 1 application topically 2 (two) times daily as needed (hemorrhoids).    [provider]  linaclotide Karlene Einstein) 145 MCG CAPS capsule Take 1 capsule (145 mcg total) by mouth daily before breakfast. Patient not taking: Reported on 01/29/2023 12/08/22   Rolly Salter, MD  magnesium oxide (MAG-OX) 400 (240 Mg) MG tablet Take 1 tablet (400 mg total) by mouth 2 (two) times daily. Patient not taking: Reported on 01/29/2023 12/07/22   Rolly Salter, MD  meloxicam (MOBIC) 15 MG tablet Take 15 mg by mouth daily.    [provider]  mupirocin ointment (BACTROBAN) 2 % Apply 1 Application topically 2 (two) times daily. Nose sore 10/27/22   [provider]  naloxone [facility]) 4 MG/0.1ML LIQD nasal spray kit Place 1 spray into the nose as needed (for overdose).     [provider]  nystatin (MYCOSTATIN/NYSTOP) powder Apply 1 g topically 4 (four) times daily as needed (irritation).    [provider]  ondansetron (ZOFRAN) 8 MG tablet Take 8 mg by mouth 2 (two) times daily as needed for nausea or vomiting.    [provider]  oxyCODONE (ROXICODONE) 15 MG immediate release tablet Take 1 tablet (15 mg total) by mouth every 8 (eight) hours as needed for pain. Patient taking differently: Take 15 mg by mouth every 4 (four) hours. Up to 6 tablets a day 03/29/21   Hollice Espy, MD  pantoprazole (PROTONIX) 40 MG tablet  Take 40 mg by mouth 2 (two) times daily.     [provider]  polyethylene glycol (MIRALAX / GLYCOLAX) 17 g packet Take 17 g by mouth 2 (two) times daily. Patient taking differently: Take 17 g by mouth daily as needed for moderate constipation. 12/07/22   Rolly Salter, MD  prochlorperazine (COMPAZINE) 10 MG tablet Take 10 mg by mouth every 6 (six) hours as needed for nausea or vomiting.    [provider]  promethazine (PHENERGAN) 25 MG tablet Take 25 mg by mouth every 8 (eight) hours as needed for nausea or vomiting.    [provider]  rizatriptan (MAXALT-MLT) 10 MG disintegrating tablet Take 10 mg by mouth as needed for migraine. May repeat in 2 hours if needed Rizatriptan benzoate 10mg  tablet disintegrating    [provider]  Semaglutide, 1 MG/DOSE, (  OZEMPIC, 1 MG/DOSE,) 4 MG/3ML SOPN Inject 1 mg into the skin every Monday.    [provider]  Sennosides (SENNA LAX PO) Take 25 mg by mouth 2 (two) times daily.    [provider]  Simethicone 180 MG CAPS Take 180 mg by mouth 2 (two) times daily as needed for flatulence.    [provider]  Simethicone 80 MG TABS Take 1 tablet (80 mg total) by mouth in the morning, at noon, in the evening, and at bedtime. Patient not taking: Reported on 12/11/2022 12/07/22   Rolly Salter, MD  simvastatin (ZOCOR) 20 MG tablet Take 20 mg by mouth at bedtime.    [provider]  topiramate (TOPAMAX) 50 MG tablet Take 50 mg by mouth daily as needed (migraines).    [provider]  traZODone (DESYREL) 50 MG tablet Take 100 mg by mouth at bedtime. 09/05/22   [provider]  triamcinolone cream (KENALOG) 0.1 % Apply 1 application topically 2 (two) times daily as needed (rash).    [provider]      Allergies    Spironolactone, Bactrim [sulfamethoxazole-trimethoprim], Gabapentin, Amoxicillin, Clindamycin/lincomycin, Doxycycline, Influenza virus vaccine, Linzess  [linaclotide], Metronidazole, and Treximet [sumatriptan-naproxen sodium]    Review of Systems   Review of Systems  Constitutional:  Negative for chills and fever.  HENT:  Negative for ear pain and sore throat.   Eyes:  Negative for pain and visual disturbance.  Respiratory:  Negative for cough and shortness of breath.   Cardiovascular:  Negative for chest pain and palpitations.  Gastrointestinal:  Positive for abdominal pain. Negative for vomiting.  Genitourinary:  Negative for dysuria and hematuria.  Musculoskeletal:  Negative for arthralgias and back pain.  Skin:  Negative for color change and rash.  Neurological:  Negative for seizures and syncope.  All other systems reviewed and are negative.   Physical Exam Updated Vital Signs BP (!) 168/96   Pulse 78   Temp 98.3 F (36.8 C) (Oral)   Resp 18   Wt 103.4 kg   SpO2 97%   BMI 43.08 kg/m  Physical Exam Vitals and nursing note reviewed.  Constitutional:      General: She is not in acute distress.    Appearance: She is well-developed.  HENT:     Head: Normocephalic and atraumatic.  Eyes:     Conjunctiva/sclera: Conjunctivae normal.  Cardiovascular:     Rate and Rhythm: Normal rate and regular rhythm.     Heart sounds: No murmur heard. Pulmonary:     Effort: Pulmonary effort is normal. No respiratory distress.     Breath sounds: Normal breath sounds.  Abdominal:     Palpations: Abdomen is soft.     Tenderness: There is no abdominal tenderness.  Musculoskeletal:        General: No swelling.     Cervical back: Neck supple.  Skin:    General: Skin is warm and dry.     Capillary Refill: Capillary refill takes less than 2 seconds.  Neurological:     Mental Status: She is alert.  Psychiatric:        Mood and Affect: Mood normal.     ED Results / Procedures / Treatments   Labs (all labs ordered are listed, but only abnormal results are displayed) Labs Reviewed  COMPREHENSIVE METABOLIC PANEL - Abnormal; Notable for  the following components:      Result Value   Glucose, Bld 100 (*)    BUN 6 (*)  Calcium 8.4 (*)    Albumin 3.3 (*)    All other components within normal limits  CBC - Abnormal; Notable for the following components:   Hemoglobin 11.3 (*)    HCT 35.7 (*)    Platelets 131 (*)    All other components within normal limits  URINALYSIS, ROUTINE W REFLEX MICROSCOPIC - Abnormal; Notable for the following components:   Color, Urine COLORLESS (*)    Specific Gravity, Urine <1.005 (*)    All other components within normal limits  LIPASE, BLOOD    EKG None  Radiology No results found.  Procedures Procedures    Medications Ordered in ED Medications  morphine (PF) 4 MG/ML injection 6 mg (6 mg Intravenous Given 09/11/23 2247)    ED Course/ Medical Decision Making/ A&P                                 Medical Decision Making Amount and/or Complexity of Data Reviewed Labs: ordered.  Risk Prescription drug management.   Patient is a 62 year old female with previous diagnosis of NASH as well as liver cirrhosis with ascites with previous therapeutic abdominal paracentesis presenting for complaints of generalized abdominal pain and low back pain.  Patient is alert and oriented x 3, no acute distress, afebrile, stable vital signs.  Physical exam demonstrates a protuberant abdomen likely distended with fluid.  No fluid wave.  No abdominal tenderness.  Doubt SBP.  Patient admits to abdominal fullness and low back pain and early satiety likely secondary to large amount of fluid ascites in the abdomen.  Recommend close follow-up with GI for therapeutic paracentesis.  Stable laboratory studies without signs or symptoms of sepsis.  No leukocytosis.  I spoke with patient's GI specialist Dr. Bosie Clos who will arrange for the office to call her for therapeutic paracentesis with interventional radiology next week.  Patient in no distress and overall condition improved here in the ED. Detailed  discussions were had with the patient regarding current findings, and need for close f/u with PCP or on call doctor. The patient has been instructed to return immediately if the symptoms worsen in any way for re-evaluation. Patient verbalized understanding and is in agreement with current care plan. All questions answered prior to discharge.         Final Clinical Impression(s) / ED Diagnoses Final diagnoses:  Other ascites    Rx / DC Orders ED Discharge Orders     None         Franne Forts, DO 09/11/23 2333

## 2023-09-11 NOTE — Discharge Instructions (Addendum)
I spoke with your GI specialist Dr. Bosie Clos who is going to have his office call you first thing next week to arrange for an interventional radiology appointment for therapeutic paracentesis which is when they drain the fluid off of your abdomen to help with your symptoms of abdominal fullness and back pain.

## 2023-09-11 NOTE — ED Triage Notes (Signed)
Cirrhosis. Pain medication not helping. -N/-V/-CP/-SOB.

## 2023-09-11 NOTE — ED Notes (Signed)
No changes while waiting in lobby

## 2023-09-12 DIAGNOSIS — R188 Other ascites: Secondary | ICD-10-CM | POA: Diagnosis not present

## 2023-09-14 ENCOUNTER — Encounter: Payer: Self-pay | Admitting: Gastroenterology

## 2023-09-14 ENCOUNTER — Other Ambulatory Visit (HOSPITAL_COMMUNITY): Payer: Self-pay | Admitting: Gastroenterology

## 2023-09-14 DIAGNOSIS — R188 Other ascites: Secondary | ICD-10-CM

## 2023-09-14 DIAGNOSIS — R140 Abdominal distension (gaseous): Secondary | ICD-10-CM

## 2023-09-17 ENCOUNTER — Ambulatory Visit (HOSPITAL_COMMUNITY)
Admission: RE | Admit: 2023-09-17 | Discharge: 2023-09-17 | Disposition: A | Discharge status: Home / Self Care | Payer: 59 | Source: Ambulatory Visit | Attending: Gastroenterology | Admitting: Gastroenterology

## 2023-09-17 DIAGNOSIS — K7581 Nonalcoholic steatohepatitis (NASH): Secondary | ICD-10-CM | POA: Diagnosis not present

## 2023-09-17 DIAGNOSIS — R188 Other ascites: Secondary | ICD-10-CM | POA: Diagnosis not present

## 2023-09-17 DIAGNOSIS — K7460 Unspecified cirrhosis of liver: Secondary | ICD-10-CM | POA: Diagnosis not present

## 2023-09-17 DIAGNOSIS — R140 Abdominal distension (gaseous): Secondary | ICD-10-CM

## 2023-09-17 HISTORY — PX: IR PARACENTESIS: IMG2679

## 2023-09-17 MED ORDER — LIDOCAINE HCL 1 % IJ SOLN
INTRAMUSCULAR | Status: AC
  Filled 2023-09-17: qty 20

## 2023-09-17 NOTE — Procedures (Addendum)
 PROCEDURE SUMMARY:  Successful US guided paracentesis from right lateral abdomen.  Yielded 3.1 liters of blood tinged fluid.  No immediate complications.  Patient tolerated well.  EBL = trace  Jeweldean Drohan S Lilyian Quayle PA-C 09/17/2023 10:33 AM

## 2023-10-02 DIAGNOSIS — K7460 Unspecified cirrhosis of liver: Secondary | ICD-10-CM | POA: Diagnosis not present

## 2023-10-02 DIAGNOSIS — J449 Chronic obstructive pulmonary disease, unspecified: Secondary | ICD-10-CM | POA: Diagnosis not present

## 2023-10-02 DIAGNOSIS — M2550 Pain in unspecified joint: Secondary | ICD-10-CM | POA: Diagnosis not present

## 2023-10-02 DIAGNOSIS — M545 Low back pain, unspecified: Secondary | ICD-10-CM | POA: Diagnosis not present

## 2023-10-02 DIAGNOSIS — M542 Cervicalgia: Secondary | ICD-10-CM | POA: Diagnosis not present

## 2023-10-02 DIAGNOSIS — R109 Unspecified abdominal pain: Secondary | ICD-10-CM | POA: Diagnosis not present

## 2023-10-02 DIAGNOSIS — Z79899 Other long term (current) drug therapy: Secondary | ICD-10-CM | POA: Diagnosis not present

## 2023-10-02 DIAGNOSIS — G8929 Other chronic pain: Secondary | ICD-10-CM | POA: Diagnosis not present

## 2023-10-07 DIAGNOSIS — J9611 Chronic respiratory failure with hypoxia: Secondary | ICD-10-CM | POA: Diagnosis not present

## 2023-10-07 DIAGNOSIS — J9621 Acute and chronic respiratory failure with hypoxia: Secondary | ICD-10-CM | POA: Diagnosis not present

## 2023-10-07 DIAGNOSIS — I2510 Atherosclerotic heart disease of native coronary artery without angina pectoris: Secondary | ICD-10-CM | POA: Diagnosis not present

## 2023-10-07 DIAGNOSIS — J9622 Acute and chronic respiratory failure with hypercapnia: Secondary | ICD-10-CM | POA: Diagnosis not present

## 2023-10-25 ENCOUNTER — Inpatient Hospital Stay (HOSPITAL_COMMUNITY): Payer: 59

## 2023-10-25 ENCOUNTER — Inpatient Hospital Stay (HOSPITAL_COMMUNITY)
Admission: EM | Admit: 2023-10-25 | Discharge: 2023-10-31 | DRG: 177 | Disposition: A | Discharge status: Home / Self Care | Payer: 59 | Attending: Internal Medicine | Admitting: Internal Medicine

## 2023-10-25 ENCOUNTER — Emergency Department (HOSPITAL_COMMUNITY): Payer: 59

## 2023-10-25 ENCOUNTER — Encounter (HOSPITAL_COMMUNITY): Payer: Self-pay

## 2023-10-25 ENCOUNTER — Other Ambulatory Visit: Payer: Self-pay

## 2023-10-25 DIAGNOSIS — G894 Chronic pain syndrome: Secondary | ICD-10-CM | POA: Diagnosis present

## 2023-10-25 DIAGNOSIS — F1120 Opioid dependence, uncomplicated: Secondary | ICD-10-CM | POA: Diagnosis present

## 2023-10-25 DIAGNOSIS — I10 Essential (primary) hypertension: Secondary | ICD-10-CM | POA: Diagnosis not present

## 2023-10-25 DIAGNOSIS — Z823 Family history of stroke: Secondary | ICD-10-CM

## 2023-10-25 DIAGNOSIS — J441 Chronic obstructive pulmonary disease with (acute) exacerbation: Secondary | ICD-10-CM | POA: Diagnosis present

## 2023-10-25 DIAGNOSIS — K7460 Unspecified cirrhosis of liver: Secondary | ICD-10-CM | POA: Diagnosis present

## 2023-10-25 DIAGNOSIS — Z6841 Body Mass Index (BMI) 40.0 and over, adult: Secondary | ICD-10-CM

## 2023-10-25 DIAGNOSIS — E1169 Type 2 diabetes mellitus with other specified complication: Secondary | ICD-10-CM | POA: Diagnosis present

## 2023-10-25 DIAGNOSIS — Z881 Allergy status to other antibiotic agents status: Secondary | ICD-10-CM

## 2023-10-25 DIAGNOSIS — R188 Other ascites: Secondary | ICD-10-CM | POA: Diagnosis present

## 2023-10-25 DIAGNOSIS — U071 COVID-19: Principal | ICD-10-CM | POA: Diagnosis present

## 2023-10-25 DIAGNOSIS — Z888 Allergy status to other drugs, medicaments and biological substances status: Secondary | ICD-10-CM

## 2023-10-25 DIAGNOSIS — M545 Low back pain, unspecified: Secondary | ICD-10-CM | POA: Diagnosis not present

## 2023-10-25 DIAGNOSIS — J440 Chronic obstructive pulmonary disease with acute lower respiratory infection: Secondary | ICD-10-CM | POA: Diagnosis present

## 2023-10-25 DIAGNOSIS — R6889 Other general symptoms and signs: Secondary | ICD-10-CM | POA: Diagnosis not present

## 2023-10-25 DIAGNOSIS — Z882 Allergy status to sulfonamides status: Secondary | ICD-10-CM

## 2023-10-25 DIAGNOSIS — Z887 Allergy status to serum and vaccine status: Secondary | ICD-10-CM

## 2023-10-25 DIAGNOSIS — R0902 Hypoxemia: Secondary | ICD-10-CM | POA: Diagnosis not present

## 2023-10-25 DIAGNOSIS — E66813 Obesity, class 3: Secondary | ICD-10-CM | POA: Diagnosis present

## 2023-10-25 DIAGNOSIS — K828 Other specified diseases of gallbladder: Secondary | ICD-10-CM | POA: Diagnosis not present

## 2023-10-25 DIAGNOSIS — E876 Hypokalemia: Secondary | ICD-10-CM | POA: Diagnosis present

## 2023-10-25 DIAGNOSIS — Z743 Need for continuous supervision: Secondary | ICD-10-CM | POA: Diagnosis not present

## 2023-10-25 DIAGNOSIS — D61818 Other pancytopenia: Secondary | ICD-10-CM | POA: Diagnosis present

## 2023-10-25 DIAGNOSIS — K219 Gastro-esophageal reflux disease without esophagitis: Secondary | ICD-10-CM | POA: Diagnosis present

## 2023-10-25 DIAGNOSIS — Z7951 Long term (current) use of inhaled steroids: Secondary | ICD-10-CM

## 2023-10-25 DIAGNOSIS — Z811 Family history of alcohol abuse and dependence: Secondary | ICD-10-CM

## 2023-10-25 DIAGNOSIS — Z96652 Presence of left artificial knee joint: Secondary | ICD-10-CM | POA: Diagnosis present

## 2023-10-25 DIAGNOSIS — K5909 Other constipation: Secondary | ICD-10-CM | POA: Diagnosis present

## 2023-10-25 DIAGNOSIS — E1165 Type 2 diabetes mellitus with hyperglycemia: Secondary | ICD-10-CM | POA: Diagnosis present

## 2023-10-25 DIAGNOSIS — I152 Hypertension secondary to endocrine disorders: Secondary | ICD-10-CM | POA: Diagnosis present

## 2023-10-25 DIAGNOSIS — J159 Unspecified bacterial pneumonia: Secondary | ICD-10-CM | POA: Diagnosis present

## 2023-10-25 DIAGNOSIS — M2550 Pain in unspecified joint: Secondary | ICD-10-CM | POA: Diagnosis not present

## 2023-10-25 DIAGNOSIS — G8929 Other chronic pain: Secondary | ICD-10-CM | POA: Diagnosis not present

## 2023-10-25 DIAGNOSIS — M542 Cervicalgia: Secondary | ICD-10-CM | POA: Diagnosis not present

## 2023-10-25 DIAGNOSIS — I517 Cardiomegaly: Secondary | ICD-10-CM | POA: Diagnosis not present

## 2023-10-25 DIAGNOSIS — J9621 Acute and chronic respiratory failure with hypoxia: Secondary | ICD-10-CM | POA: Diagnosis present

## 2023-10-25 DIAGNOSIS — Z801 Family history of malignant neoplasm of trachea, bronchus and lung: Secondary | ICD-10-CM

## 2023-10-25 DIAGNOSIS — J1282 Pneumonia due to coronavirus disease 2019: Secondary | ICD-10-CM | POA: Diagnosis present

## 2023-10-25 DIAGNOSIS — I2510 Atherosclerotic heart disease of native coronary artery without angina pectoris: Secondary | ICD-10-CM | POA: Diagnosis present

## 2023-10-25 DIAGNOSIS — F32A Depression, unspecified: Secondary | ICD-10-CM | POA: Diagnosis present

## 2023-10-25 DIAGNOSIS — E7800 Pure hypercholesterolemia, unspecified: Secondary | ICD-10-CM | POA: Diagnosis present

## 2023-10-25 DIAGNOSIS — F418 Other specified anxiety disorders: Secondary | ICD-10-CM | POA: Diagnosis present

## 2023-10-25 DIAGNOSIS — J9611 Chronic respiratory failure with hypoxia: Secondary | ICD-10-CM | POA: Diagnosis present

## 2023-10-25 DIAGNOSIS — R1900 Intra-abdominal and pelvic swelling, mass and lump, unspecified site: Secondary | ICD-10-CM | POA: Diagnosis not present

## 2023-10-25 DIAGNOSIS — M7989 Other specified soft tissue disorders: Secondary | ICD-10-CM | POA: Diagnosis not present

## 2023-10-25 DIAGNOSIS — G4733 Obstructive sleep apnea (adult) (pediatric): Secondary | ICD-10-CM | POA: Diagnosis present

## 2023-10-25 DIAGNOSIS — K5730 Diverticulosis of large intestine without perforation or abscess without bleeding: Secondary | ICD-10-CM | POA: Diagnosis not present

## 2023-10-25 DIAGNOSIS — K760 Fatty (change of) liver, not elsewhere classified: Secondary | ICD-10-CM | POA: Diagnosis present

## 2023-10-25 DIAGNOSIS — J189 Pneumonia, unspecified organism: Secondary | ICD-10-CM

## 2023-10-25 DIAGNOSIS — Z79818 Long term (current) use of other agents affecting estrogen receptors and estrogen levels: Secondary | ICD-10-CM

## 2023-10-25 DIAGNOSIS — F419 Anxiety disorder, unspecified: Secondary | ICD-10-CM | POA: Diagnosis present

## 2023-10-25 DIAGNOSIS — F17210 Nicotine dependence, cigarettes, uncomplicated: Secondary | ICD-10-CM | POA: Diagnosis present

## 2023-10-25 DIAGNOSIS — Z79899 Other long term (current) drug therapy: Secondary | ICD-10-CM

## 2023-10-25 DIAGNOSIS — Z7985 Long-term (current) use of injectable non-insulin antidiabetic drugs: Secondary | ICD-10-CM

## 2023-10-25 DIAGNOSIS — Z880 Allergy status to penicillin: Secondary | ICD-10-CM

## 2023-10-25 DIAGNOSIS — R140 Abdominal distension (gaseous): Secondary | ICD-10-CM | POA: Diagnosis not present

## 2023-10-25 DIAGNOSIS — J449 Chronic obstructive pulmonary disease, unspecified: Secondary | ICD-10-CM | POA: Diagnosis not present

## 2023-10-25 DIAGNOSIS — K222 Esophageal obstruction: Secondary | ICD-10-CM | POA: Insufficient documentation

## 2023-10-25 DIAGNOSIS — Z803 Family history of malignant neoplasm of breast: Secondary | ICD-10-CM

## 2023-10-25 DIAGNOSIS — R109 Unspecified abdominal pain: Secondary | ICD-10-CM | POA: Diagnosis not present

## 2023-10-25 DIAGNOSIS — Z8249 Family history of ischemic heart disease and other diseases of the circulatory system: Secondary | ICD-10-CM

## 2023-10-25 DIAGNOSIS — J811 Chronic pulmonary edema: Secondary | ICD-10-CM | POA: Diagnosis not present

## 2023-10-25 DIAGNOSIS — Z791 Long term (current) use of non-steroidal anti-inflammatories (NSAID): Secondary | ICD-10-CM

## 2023-10-25 DIAGNOSIS — G43909 Migraine, unspecified, not intractable, without status migrainosus: Secondary | ICD-10-CM | POA: Diagnosis present

## 2023-10-25 DIAGNOSIS — K7689 Other specified diseases of liver: Secondary | ICD-10-CM | POA: Diagnosis not present

## 2023-10-25 LAB — GLUCOSE, PLEURAL OR PERITONEAL FLUID: Glucose, Fluid: 83 mg/dL

## 2023-10-25 LAB — RESP PANEL BY RT-PCR (RSV, FLU A&B, COVID)  RVPGX2
Influenza A by PCR: NEGATIVE
Influenza B by PCR: NEGATIVE
Resp Syncytial Virus by PCR: NEGATIVE
SARS Coronavirus 2 by RT PCR: POSITIVE — AB

## 2023-10-25 LAB — COMPREHENSIVE METABOLIC PANEL
ALT: 12 U/L (ref 0–44)
AST: 29 U/L (ref 15–41)
Albumin: 3 g/dL — ABNORMAL LOW (ref 3.5–5.0)
Alkaline Phosphatase: 62 U/L (ref 38–126)
Anion gap: 9 (ref 5–15)
BUN: 5 mg/dL — ABNORMAL LOW (ref 8–23)
CO2: 33 mmol/L — ABNORMAL HIGH (ref 22–32)
Calcium: 7.8 mg/dL — ABNORMAL LOW (ref 8.9–10.3)
Chloride: 98 mmol/L (ref 98–111)
Creatinine, Ser: 0.69 mg/dL (ref 0.44–1.00)
GFR, Estimated: >60 mL/min (ref >60)
Glucose, Bld: 87 mg/dL (ref 70–99)
Potassium: 3 mmol/L — ABNORMAL LOW (ref 3.5–5.1)
Sodium: 140 mmol/L (ref 135–145)
Total Bilirubin: 0.9 mg/dL (ref 0.0–1.2)
Total Protein: 7.6 g/dL (ref 6.5–8.1)

## 2023-10-25 LAB — I-STAT CG4 LACTIC ACID, ED: Lactic Acid, Venous: 0.7 mmol/L (ref 0.5–1.9)

## 2023-10-25 LAB — CBC WITH DIFFERENTIAL/PLATELET
Abs Immature Granulocytes: 0.03 10*3/uL (ref 0.00–0.07)
Basophils Absolute: 0 10*3/uL (ref 0.0–0.1)
Basophils Relative: 0 %
Eosinophils Absolute: 0 10*3/uL (ref 0.0–0.5)
Eosinophils Relative: 0 %
HCT: 36.1 % (ref 36.0–46.0)
Hemoglobin: 10.8 g/dL — ABNORMAL LOW (ref 12.0–15.0)
Immature Granulocytes: 1 %
Lymphocytes Relative: 26 %
Lymphs Abs: 1 10*3/uL (ref 0.7–4.0)
MCH: 25.5 pg — ABNORMAL LOW (ref 26.0–34.0)
MCHC: 29.9 g/dL — ABNORMAL LOW (ref 30.0–36.0)
MCV: 85.3 fL (ref 80.0–100.0)
Monocytes Absolute: 0.3 10*3/uL (ref 0.1–1.0)
Monocytes Relative: 7 %
Neutro Abs: 2.5 10*3/uL (ref 1.7–7.7)
Neutrophils Relative %: 66 %
Platelets: 125 10*3/uL — ABNORMAL LOW (ref 150–400)
RBC: 4.23 MIL/uL (ref 3.87–5.11)
RDW: 15.4 % (ref 11.5–15.5)
WBC: 3.7 10*3/uL — ABNORMAL LOW (ref 4.0–10.5)
nRBC: 0 % (ref 0.0–0.2)

## 2023-10-25 LAB — URINALYSIS, W/ REFLEX TO CULTURE (INFECTION SUSPECTED)
Bilirubin Urine: NEGATIVE
Glucose, UA: NEGATIVE mg/dL
Hgb urine dipstick: NEGATIVE
Ketones, ur: NEGATIVE mg/dL
Leukocytes,Ua: NEGATIVE
Nitrite: NEGATIVE
Protein, ur: NEGATIVE mg/dL
Specific Gravity, Urine: 1.006 (ref 1.005–1.030)
pH: 6 (ref 5.0–8.0)

## 2023-10-25 LAB — PROCALCITONIN: Procalcitonin: 3.5 ng/mL

## 2023-10-25 LAB — ALBUMIN, PLEURAL OR PERITONEAL FLUID: Albumin, Fluid: 1.9 g/dL

## 2023-10-25 LAB — APTT: aPTT: 30 s (ref 24–36)

## 2023-10-25 LAB — LACTATE DEHYDROGENASE, PLEURAL OR PERITONEAL FLUID: LD, Fluid: 105 U/L — ABNORMAL HIGH (ref 3–23)

## 2023-10-25 LAB — MAGNESIUM: Magnesium: 1.9 mg/dL (ref 1.7–2.4)

## 2023-10-25 LAB — PHOSPHORUS: Phosphorus: 2.1 mg/dL — ABNORMAL LOW (ref 2.5–4.6)

## 2023-10-25 LAB — PROTEIN, PLEURAL OR PERITONEAL FLUID: Total protein, fluid: 4 g/dL

## 2023-10-25 LAB — CBG MONITORING, ED
Glucose-Capillary: 75 mg/dL (ref 70–99)
Glucose-Capillary: 124 mg/dL — ABNORMAL HIGH (ref 70–99)
Glucose-Capillary: 160 mg/dL — ABNORMAL HIGH (ref 70–99)

## 2023-10-25 LAB — PROTIME-INR
INR: 1.2 (ref 0.8–1.2)
Prothrombin Time: 15.6 s — ABNORMAL HIGH (ref 11.4–15.2)

## 2023-10-25 LAB — LIPASE, BLOOD: Lipase: 33 U/L (ref 11–51)

## 2023-10-25 LAB — GLUCOSE, CAPILLARY: Glucose-Capillary: 175 mg/dL — ABNORMAL HIGH (ref 70–99)

## 2023-10-25 MED ORDER — IOHEXOL 300 MG/ML  SOLN
100.0000 mL | Freq: Once | INTRAMUSCULAR | Status: AC | PRN
  Administered 2023-10-25: 100 mL via INTRAVENOUS

## 2023-10-25 MED ORDER — SODIUM CHLORIDE 0.9 % IV SOLN
2.0000 g | INTRAVENOUS | Status: AC
  Administered 2023-10-26 – 2023-10-29 (×4): 2 g via INTRAVENOUS
  Filled 2023-10-25 (×4): qty 20

## 2023-10-25 MED ORDER — TRAZODONE HCL 100 MG PO TABS
100.0000 mg | ORAL_TABLET | Freq: Every day | ORAL | Status: DC
  Administered 2023-10-25 – 2023-10-30 (×6): 100 mg via ORAL
  Filled 2023-10-25 (×6): qty 1

## 2023-10-25 MED ORDER — PROCHLORPERAZINE EDISYLATE 10 MG/2ML IJ SOLN
10.0000 mg | Freq: Once | INTRAMUSCULAR | Status: AC
  Administered 2023-10-25: 10 mg via INTRAVENOUS
  Filled 2023-10-25: qty 2

## 2023-10-25 MED ORDER — POLYETHYLENE GLYCOL 3350 17 G PO PACK
17.0000 g | PACK | Freq: Every day | ORAL | Status: DC | PRN
  Filled 2023-10-25: qty 1

## 2023-10-25 MED ORDER — CLONIDINE HCL 0.2 MG PO TABS
0.2000 mg | ORAL_TABLET | Freq: Every day | ORAL | Status: DC
  Administered 2023-10-25 – 2023-10-31 (×7): 0.2 mg via ORAL
  Filled 2023-10-25 (×6): qty 1
  Filled 2023-10-25: qty 2

## 2023-10-25 MED ORDER — ONDANSETRON HCL 4 MG PO TABS
4.0000 mg | ORAL_TABLET | Freq: Four times a day (QID) | ORAL | Status: DC | PRN
  Administered 2023-10-28 – 2023-10-29 (×2): 4 mg via ORAL
  Filled 2023-10-25 (×2): qty 1

## 2023-10-25 MED ORDER — CEFTRIAXONE SODIUM 2 G IJ SOLR
2.0000 g | Freq: Once | INTRAMUSCULAR | Status: AC
  Administered 2023-10-25: 2 g via INTRAVENOUS
  Filled 2023-10-25: qty 20

## 2023-10-25 MED ORDER — MORPHINE SULFATE (PF) 4 MG/ML IV SOLN
4.0000 mg | Freq: Once | INTRAVENOUS | Status: AC
  Administered 2023-10-25: 4 mg via INTRAVENOUS
  Filled 2023-10-25: qty 1

## 2023-10-25 MED ORDER — HYDROMORPHONE HCL 1 MG/ML IJ SOLN
1.0000 mg | Freq: Once | INTRAMUSCULAR | Status: AC
  Administered 2023-10-25: 1 mg via INTRAVENOUS
  Filled 2023-10-25: qty 1

## 2023-10-25 MED ORDER — ACETAMINOPHEN 650 MG RE SUPP: 650.0000 mg | Freq: Four times a day (QID) | RECTAL | Status: DC | PRN

## 2023-10-25 MED ORDER — LIDOCAINE HCL 1 % IJ SOLN
INTRAMUSCULAR | Status: AC
  Filled 2023-10-25: qty 20

## 2023-10-25 MED ORDER — MAGNESIUM OXIDE -MG SUPPLEMENT 400 (240 MG) MG PO TABS: 400.0000 mg | ORAL_TABLET | Freq: Two times a day (BID) | ORAL | Status: DC

## 2023-10-25 MED ORDER — SODIUM CHLORIDE 0.9 % IV SOLN
500.0000 mg | INTRAVENOUS | Status: DC
  Filled 2023-10-25: qty 5

## 2023-10-25 MED ORDER — OXYCODONE HCL 5 MG PO TABS: 15.0000 mg | ORAL_TABLET | Freq: Three times a day (TID) | ORAL | Status: DC | PRN

## 2023-10-25 MED ORDER — BENAZEPRIL HCL 20 MG PO TABS
40.0000 mg | ORAL_TABLET | Freq: Every day | ORAL | Status: DC
  Administered 2023-10-25 – 2023-10-31 (×7): 40 mg via ORAL
  Filled 2023-10-25 (×7): qty 2

## 2023-10-25 MED ORDER — LACTULOSE 10 GM/15ML PO SOLN: 10.0000 g | Freq: Two times a day (BID) | ORAL | Status: DC | PRN

## 2023-10-25 MED ORDER — MORPHINE SULFATE (PF) 4 MG/ML IV SOLN
4.0000 mg | Freq: Once | INTRAVENOUS | Status: AC
Start: 2023-10-25 — End: 2023-10-25
  Administered 2023-10-25: 4 mg via INTRAVENOUS
  Filled 2023-10-25: qty 1

## 2023-10-25 MED ORDER — HYDROMORPHONE HCL 1 MG/ML IJ SOLN
1.0000 mg | INTRAMUSCULAR | Status: DC | PRN
  Administered 2023-10-25 – 2023-10-26 (×6): 1 mg via INTRAVENOUS
  Filled 2023-10-25 (×7): qty 1

## 2023-10-25 MED ORDER — ONDANSETRON HCL 4 MG/2ML IJ SOLN
4.0000 mg | Freq: Once | INTRAMUSCULAR | Status: AC
  Administered 2023-10-25: 4 mg via INTRAVENOUS
  Filled 2023-10-25: qty 2

## 2023-10-25 MED ORDER — SODIUM CHLORIDE 0.9 % IV SOLN
500.0000 mg | Freq: Once | INTRAVENOUS | Status: AC
  Administered 2023-10-25: 500 mg via INTRAVENOUS
  Filled 2023-10-25: qty 5

## 2023-10-25 MED ORDER — POTASSIUM CHLORIDE 20 MEQ PO PACK
40.0000 meq | PACK | Freq: Once | ORAL | Status: AC
  Administered 2023-10-25: 40 meq via ORAL
  Filled 2023-10-25: qty 2

## 2023-10-25 MED ORDER — ACETAMINOPHEN 325 MG PO TABS
650.0000 mg | ORAL_TABLET | Freq: Four times a day (QID) | ORAL | Status: DC | PRN
  Administered 2023-10-25 – 2023-10-27 (×3): 650 mg via ORAL
  Filled 2023-10-25 (×3): qty 2

## 2023-10-25 MED ORDER — LOPERAMIDE HCL 2 MG PO CAPS
2.0000 mg | ORAL_CAPSULE | Freq: Once | ORAL | Status: AC
  Administered 2023-10-26: 2 mg via ORAL
  Filled 2023-10-25: qty 1

## 2023-10-25 MED ORDER — FUROSEMIDE 40 MG PO TABS
40.0000 mg | ORAL_TABLET | Freq: Every day | ORAL | Status: DC
  Administered 2023-10-26 – 2023-10-31 (×6): 40 mg via ORAL
  Filled 2023-10-25 (×6): qty 1

## 2023-10-25 MED ORDER — ALBUTEROL SULFATE (2.5 MG/3ML) 0.083% IN NEBU: 2.5000 mg | INHALATION_SOLUTION | Freq: Four times a day (QID) | RESPIRATORY_TRACT | Status: DC | PRN

## 2023-10-25 MED ORDER — DEXAMETHASONE SODIUM PHOSPHATE 10 MG/ML IJ SOLN
6.0000 mg | INTRAMUSCULAR | Status: DC
  Administered 2023-10-25: 6 mg via INTRAVENOUS
  Filled 2023-10-25: qty 1

## 2023-10-25 MED ORDER — OXYCODONE HCL 5 MG PO TABS
20.0000 mg | ORAL_TABLET | Freq: Every day | ORAL | Status: DC
  Administered 2023-10-25 – 2023-10-31 (×28): 20 mg via ORAL
  Filled 2023-10-25 (×29): qty 4

## 2023-10-25 MED ORDER — ESCITALOPRAM OXALATE 20 MG PO TABS
20.0000 mg | ORAL_TABLET | Freq: Every day | ORAL | Status: DC
  Administered 2023-10-25 – 2023-10-31 (×7): 20 mg via ORAL
  Filled 2023-10-25 (×3): qty 1
  Filled 2023-10-25: qty 2
  Filled 2023-10-25 (×2): qty 1
  Filled 2023-10-25: qty 2

## 2023-10-25 MED ORDER — ONDANSETRON HCL 4 MG/2ML IJ SOLN
4.0000 mg | Freq: Four times a day (QID) | INTRAMUSCULAR | Status: DC | PRN
  Administered 2023-10-26 – 2023-10-29 (×5): 4 mg via INTRAVENOUS
  Filled 2023-10-25 (×5): qty 2

## 2023-10-25 MED ORDER — IPRATROPIUM-ALBUTEROL 0.5-2.5 (3) MG/3ML IN SOLN
3.0000 mL | Freq: Four times a day (QID) | RESPIRATORY_TRACT | Status: DC
  Administered 2023-10-25 – 2023-10-26 (×3): 3 mL via RESPIRATORY_TRACT
  Filled 2023-10-25 (×4): qty 3

## 2023-10-25 NOTE — H&P (Signed)
 History and Physical    Patient: [name] FMW:[id number] DOB: 02/16/1961 DOA: 10/25/2023 DOS: the patient was seen and examined on 10/25/2023 PCP: Arloa Elsie SAUNDERS, MD  Patient coming from: Home  Chief Complaint:  Chief Complaint  Patient presents with   Abdominal Pain   HPI: [name] is a 63 y.o. female with medical history significant of chronic abdominal pain, chronic respiratory failure with hypoxia, anxiety, depression, osteoarthritis, nonalcoholic liver cirrhosis who presented to the emergency department with complaints of exacerbation of chronic abdominal pain associated with fever, chills, fatigue, malaise, decreased appetite, nausea and vomiting.  She has also been dyspneic with wheezing and having an occasional productive cough at times. No constipation, melena or hematochezia.  No flank pain, dysuria, frequency or hematuria.  No hemoptysis.  No chest pain, palpitations, diaphoresis, PND, orthopnea or significant pitting edema of the lower extremities.No polyuria, polydipsia, polyphagia or blurred vision.   Lab work: Urinalysis was hazy with rare bacteria microscopic examination.  CBC showed a white count of 3.7 with 66% neutrophils, hemoglobin 10.8 g/dL platelets 874.  PT 15.6 INR 1.2 and PTT 30.  Lactic acid and lipase were normal.  Fluid analysis was normal, with the exception of LDH of 105 units/L.  CMP showed a potassium of 3.0 and CO2 of 33 mmol/L with a normal anion gap.  Albumin  was 3.0 g/dL and corrected calcium was around 8.7 mg/dL.  The rest of the LFTs, electrolytes and renal function were unremarkable.  Phosphorus 2.1 and magnesium  1.9 mg deciliter.  Procalcitonin 3.15 ng/mL.  Coronavirus PCR was positive, negative influenza A/B and RSV PCR.  Imaging: Portable 1 view chest radiograph showing patchy, symmetric perihilar lung opacity more resembling multilobar infection then asymmetric pulmonary edema.  No consolidation or pleural effusion.  CT abdomen/pelvis with contrast  showing groundglass opacity in the lung bases bilateral suspicious for pneumonia.  Mildly enlarged subcarinal lymph node is partially visualized measuring up to 1.5 cm most likely inflammatory, however incompletely evaluated on this examination.  Progressive cirrhosis with moderate abdominal ascites.  ED course: Initial vital signs were temperature 98.3 F, pulse 76, respirations 17, BP 183/94 mmHg O2 sat 96% on room air.  The patient received azithromycin  500 mg IVPB, ceftriaxone  2 g IVPB, morphine  4 mg IVP x 3, ondansetron  4 mg IVP x 2, KCl 40 mEq p.o. x 1 and I added prochlorperazine  10 mg plus hydromorphone  1 mg IVP x 1 each.  Dr. Pamella performed at diagnostic paracentesis.  Review of Systems: As mentioned in the history of present illness. All other systems reviewed and are negative. Past Medical History:  Diagnosis Date   Abdominal pain 01/12/2019   Abdominal pain, generalized 07/10/2014   Acute on chronic respiratory failure with hypoxia ([name]) 10/18/2018   Anxiety    Arthritis    Ascites 05/01/2019   NALD   Asthma    CAD (coronary artery disease)    cath 2010 with 60-70% left Cx and normal LVF   Chronic low back pain with left-sided sciatica 10/15/2015   Chronic pain    Chronic prescription benzodiazepine use 05/01/2019   Chronic, continuous use of opioids 05/01/2019   Cirrhosis of liver not due to alcohol  ([name]) 05/01/2019   Cirrhosis of liver: per CT 07/11/2014   Constipation by delayed colonic transit 12/09/2014   COPD (chronic obstructive pulmonary disease) ([name])    COPD exacerbation ([name]) 10/12/2016   Dehiscence of surgical wound left knee 03/16/2013   Depression    Depression with anxiety 01/11/2019  Deviated septum    Diabetes mellitus without complication ([name])    type 2   Generalized abdominal pain 12/09/2014   GERD (gastroesophageal reflux disease)    Headache(784.0)    migraines   Hepatic cirrhosis ([name]) 06/2014   HLD (hyperlipidemia) 01/11/2019   Hypercholesterolemia     Hypertension    Hypertension associated with diabetes ([name])    Ileus ([name]) 07/11/2014   Influenza A 10/2018   Lobar pneumonia ([name])    Microcytic anemia 01/11/2019   Migraine headache 12/09/2014   Morbid obesity ([name]) 03/09/2013   Obesity    OSA (obstructive sleep apnea) 01/11/2019   Pars defect with spondylolisthesis 05/01/2019   Perforated bowel ([name])    PONV (postoperative nausea and vomiting)    Respiratory failure, acute-on-chronic ([name]) 12/11/2010   S/P left PF UKR 03/07/2013   Seizures ([name])    as a little girl 63 years old   Shortness of breath    Sleep apnea    dont wear bipap . lost weight   Spondylolisthesis, grade 2    Tobacco abuse    Type 2 diabetes mellitus with obesity ([name]) 01/11/2019   Past Surgical History:  Procedure Laterality Date   ABDOMINAL EXPLORATION SURGERY     primary repair of colon perforation from [facility]   ABDOMINAL HYSTERECTOMY     total   BACK SURGERY     lumbar   COLONOSCOPY WITH PROPOFOL  N/A 10/04/2019   Procedure: COLONOSCOPY WITH PROPOFOL ;  Surgeon: Dianna Specking, MD;  Location: [facility];  Service: Endoscopy;  Laterality: N/A;   ESOPHAGOGASTRODUODENOSCOPY (EGD) WITH PROPOFOL  N/A 10/29/2016   Procedure: ESOPHAGOGASTRODUODENOSCOPY (EGD) WITH PROPOFOL ;  Surgeon: Lynwood Bohr, MD;  Location: [facility];  Service: Endoscopy;  Laterality: N/A;   ESOPHAGOGASTRODUODENOSCOPY (EGD) WITH PROPOFOL  N/A 10/04/2019   Procedure: ESOPHAGOGASTRODUODENOSCOPY (EGD) WITH PROPOFOL ;  Surgeon: Dianna Specking, MD;  Location: [facility];  Service: Endoscopy;  Laterality: N/A;   ESOPHAGOGASTRODUODENOSCOPY (EGD) WITH PROPOFOL  N/A 02/03/2023   Procedure: ESOPHAGOGASTRODUODENOSCOPY (EGD) WITH PROPOFOL ;  Surgeon: Dianna Specking, MD;  Location: [facility];  Service: Gastroenterology;  Laterality: N/A;   flank ablation     IR PARACENTESIS  01/12/2019   IR PARACENTESIS  05/02/2019   IR PARACENTESIS  08/03/2019   IR PARACENTESIS  08/12/2022   IR PARACENTESIS   08/14/2022   IR PARACENTESIS  07/16/2023   IR PARACENTESIS  09/17/2023   IRRIGATION AND DEBRIDEMENT KNEE Left 03/14/2013   Procedure: IRRIGATION AND DEBRIDEMENT  and Closure of wound left KNEE;  Surgeon: Donnice JONETTA Car, MD;  Location: [facility];  Service: Orthopedics;  Laterality: Left;   PATELLA-FEMORAL ARTHROPLASTY Left 03/07/2013   Procedure: LEFT PATELLA-FEMORAL ARTHROPLASTY;  Surgeon: Donnice JONETTA Car, MD;  Location: [facility];  Service: Orthopedics;  Laterality: Left;   SAVORY DILATION N/A 10/29/2016   Procedure: SAVORY DILATION;  Surgeon: Lynwood Bohr, MD;  Location: [facility];  Service: Endoscopy;  Laterality: N/A;   Social History:  reports that she has been smoking cigarettes. She has never used smokeless tobacco. She reports that she does not drink alcohol  and does not use drugs.  Allergies  Allergen Reactions   Spironolactone  Nausea Only   Bactrim [Sulfamethoxazole-Trimethoprim] Hives    Hives all over body   Gabapentin Other (See Comments)    sleepwalking   Amoxicillin  Nausea And Vomiting and Other (See Comments)    tolerated augmentin  in the past (03/2013) without issue. Did it involve swelling of the face/tongue/throat, SOB, or low BP? No Did it involve sudden or severe rash/hives, skin  peeling, or any reaction on the inside of your mouth or nose? No Did you need to seek medical attention at a hospital or doctor's office? No When did it last happen? unk       If all above answers are "NO", may proceed with cephalosporin use.    Clindamycin/Lincomycin Nausea And Vomiting   Doxycycline  Nausea And Vomiting   Influenza Virus Vaccine     Other Reaction(s): mom got Guillian Barre   Linzess  [Linaclotide ] Nausea And Vomiting   Metronidazole  Nausea Only   Treximet [Sumatriptan-Naproxen Sodium] Nausea And Vomiting    Family History  Problem Relation Age of Onset   Stroke Mother    Cancer - Lung Father    Heart attack Sister    Alcohol  abuse Sister    Breast cancer Maternal Aunt     Hypertension Brother     Prior to Admission medications   Medication Sig Start Date End Date Taking? Authorizing Provider  acetaminophen  (TYLENOL ) 500 MG tablet Take 1,000 mg by mouth every 6 (six) hours as needed for mild pain.    [provider]  albuterol  (PROVENTIL  HFA;VENTOLIN  HFA) 108 (90 BASE) MCG/ACT inhaler Inhale 2 puffs into the lungs every 6 (six) hours as needed for wheezing or shortness of breath.    [provider]  albuterol  (PROVENTIL ) (2.5 MG/3ML) 0.083% nebulizer solution Take 2.5 mg by nebulization every 6 (six) hours as needed for wheezing or shortness of breath.    [provider]  ALPRAZolam  (XANAX ) 1 MG tablet Take 1-2 mg by mouth See admin instructions. Take one tablet (1 mg) by mouth in the morning and two tablets (2 mg) in the evening.    [provider]  baclofen  (LIORESAL ) 10 MG tablet Take 1 tablet (10 mg total) by mouth 3 (three) times daily. Patient taking differently: Take 10 mg by mouth 3 (three) times daily as needed for muscle spasms. 12/07/22   Tobie Yetta HERO, MD  benazepril  (LOTENSIN ) 40 MG tablet Take 40 mg by mouth daily.    [provider]  Benzocaine-Resorcinol (VAGISIL EX) Apply 1 application topically daily.    [provider]  bisacodyl  (DULCOLAX) 10 MG suppository Place 1 suppository (10 mg total) rectally daily as needed for moderate constipation. Patient not taking: Reported on 12/11/2022 12/07/22   Tobie Yetta HERO, MD  budesonide  (PULMICORT ) 1 MG/2ML nebulizer solution Take 1 mg by nebulization daily as needed (shortness of breath). 04/08/21   [provider]  ciprofloxacin  (CIPRO ) 500 MG tablet Take 1 tablet (500 mg total) by mouth daily with breakfast. 08/18/22   Regalado, Belkys A, MD  cloNIDine  (CATAPRES ) 0.1 MG tablet Take 1 tablet (0.1 mg total) by mouth daily. 12/08/22   Tobie Yetta HERO, MD  cyclobenzaprine  (FLEXERIL ) 10 MG tablet Take 10 mg by mouth daily as needed for muscle  spasms.    [provider]  dicyclomine  (BENTYL ) 10 MG capsule Take 1 capsule (10 mg total) by mouth 4 (four) times daily -  before meals and at bedtime. Patient not taking: Reported on 01/29/2023 12/07/22   Tobie Yetta HERO, MD  escitalopram  (LEXAPRO ) 20 MG tablet Take 20 mg by mouth daily.    [provider]  estradiol  (ESTRACE ) 2 MG tablet Take 2 mg by mouth daily.     [provider]  fluticasone  (FLONASE ) 50 MCG/ACT nasal spray Place 1 spray into both nostrils daily as needed for allergies.    [provider]  furosemide  (LASIX ) 40 MG tablet Take  1 tablet (40 mg total) by mouth daily. Take additional 40 mg when you have a weight gain of 3 lbs in 1 day or 5 lbs in 1 week. 12/08/22   Tobie Yetta HERO, MD  Glycerin -Polysorbate 80 (REFRESH DRY EYE THERAPY OP) Place 1 drop into both eyes daily as needed (dry eyes).    [provider]  lactulose  (CHRONULAC ) 10 GM/15ML solution Take 15 mLs by mouth 2 (two) times daily as needed for mild constipation. 10/22/22   [provider]  levocetirizine (XYZAL ) 5 MG tablet Take 5 mg by mouth every evening.  09/16/19   [provider]  lidocaine  (LIDODERM ) 5 % Place 1 patch onto the skin daily. Remove & Discard patch within 12 hours or as directed by MD Patient taking differently: Place 1 patch onto the skin daily as needed (pain). Remove & Discard patch within 12 hours or as directed by MD 11/07/22   Tegeler, Lonni PARAS, MD  lidocaine  (XYLOCAINE ) 5 % ointment Apply 1 application topically 2 (two) times daily as needed (hemorrhoids).    [provider]  linaclotide  (LINZESS ) 145 MCG CAPS capsule Take 1 capsule (145 mcg total) by mouth daily before breakfast. Patient not taking: Reported on 01/29/2023 12/08/22   Patel, Pranav M, MD  magnesium  oxide (MAG-OX) 400 (240 Mg) MG tablet Take 1 tablet (400 mg total) by mouth 2 (two) times daily. Patient not taking: Reported on 01/29/2023 12/07/22   Tobie Yetta HERO, MD  meloxicam  (MOBIC ) 15 MG tablet Take 15 mg by mouth daily.    [provider]  mupirocin ointment (BACTROBAN) 2 % Apply 1 Application topically 2 (two) times daily. Nose sore 10/27/22   [provider]  naloxone  (NARCAN ) 4 MG/0.1ML LIQD nasal spray kit Place 1 spray into the nose as needed (for overdose).     [provider]  nystatin  (MYCOSTATIN /NYSTOP ) powder Apply 1 g topically 4 (four) times daily as needed (irritation).    [provider]  ondansetron  (ZOFRAN ) 8 MG tablet Take 8 mg by mouth 2 (two) times daily as needed for nausea or vomiting.    [provider]  oxyCODONE  (ROXICODONE ) 15 MG immediate release tablet Take 1 tablet (15 mg total) by mouth every 8 (eight) hours as needed for pain. Patient taking differently: Take 15 mg by mouth every 4 (four) hours. Up to 6 tablets a day 03/29/21   Krishnan, Sendil K, MD  pantoprazole  (PROTONIX ) 40 MG tablet Take 40 mg by mouth 2 (two) times daily.     [provider]  polyethylene glycol (MIRALAX  / GLYCOLAX ) 17 g packet Take 17 g by mouth 2 (two) times daily. Patient taking differently: Take 17 g by mouth daily as needed for moderate constipation. 12/07/22   Tobie Yetta HERO, MD  prochlorperazine  (COMPAZINE ) 10 MG tablet Take 10 mg by mouth every 6 (six) hours as needed for nausea or vomiting.    [provider]  promethazine  (PHENERGAN ) 25 MG tablet Take 25 mg by mouth every 8 (eight) hours as needed for nausea or vomiting.    [provider]  rizatriptan (MAXALT-MLT) 10 MG disintegrating tablet Take 10 mg by mouth as needed for migraine. May repeat in 2 hours if needed Rizatriptan benzoate 10mg  tablet disintegrating    [provider]  Semaglutide, 1 MG/DOSE, (OZEMPIC, 1 MG/DOSE,) 4 MG/3ML SOPN Inject 1 mg into the skin every Monday.    [provider]  Sennosides (SENNA LAX PO) Take 25 mg by mouth 2 (  two) times daily.    [provider]   Simethicone  180 MG CAPS Take 180 mg by mouth 2 (two) times daily as needed for flatulence.    [provider]  Simethicone  80 MG TABS Take 1 tablet (80 mg total) by mouth in the morning, at noon, in the evening, and at bedtime. Patient not taking: Reported on 12/11/2022 12/07/22   Tobie Yetta HERO, MD  simvastatin  (ZOCOR ) 20 MG tablet Take 20 mg by mouth at bedtime.    [provider]  topiramate  (TOPAMAX ) 50 MG tablet Take 50 mg by mouth daily as needed (migraines).    [provider]  traZODone  (DESYREL ) 50 MG tablet Take 100 mg by mouth at bedtime. 09/05/22   [provider]  triamcinolone  cream (KENALOG ) 0.1 % Apply 1 application topically 2 (two) times daily as needed (rash).    [provider]    Physical Exam: Vitals:   10/25/23 0250 10/25/23 0400  BP: (!) 183/94 (!) 177/90  Pulse: 76 71  Resp: 17 14  Temp: 98.3 F (36.8 C)   TempSrc: Oral   SpO2: 96% 93%   Physical Exam Vitals and nursing note reviewed.  Constitutional:      General: She is awake. She is not in acute distress.    Appearance: She is obese. She is ill-appearing.     Interventions: Nasal cannula in place.  HENT:     Head: Normocephalic.     Nose: No rhinorrhea.     Mouth/Throat:     Mouth: Mucous membranes are moist.  Eyes:     General: No scleral icterus.    Pupils: Pupils are equal, round, and reactive to light.  Neck:     Vascular: No JVD.  Cardiovascular:     Rate and Rhythm: Normal rate and regular rhythm.     Heart sounds: S1 normal and S2 normal.  Pulmonary:     Effort: No accessory muscle usage or respiratory distress.     Breath sounds: Wheezing and rales present. No rhonchi.  Abdominal:     General: Abdomen is protuberant. Bowel sounds are normal. There is distension.     Palpations: Abdomen is soft. There is shifting dullness.     Tenderness: There is abdominal tenderness.  Musculoskeletal:     Cervical back: Neck supple.     Right lower leg:  No edema.     Left lower leg: No edema.  Skin:    General: Skin is warm and dry.  Neurological:     General: No focal deficit present.     Mental Status: She is alert and oriented to person, place, and time.  Psychiatric:        Mood and Affect: Mood normal.        Behavior: Behavior normal. Behavior is cooperative.     Data Reviewed:  Results are pending, will review when available. 08/14/2022 echocardiogram report for. IMPRESSIONS:   1. Left ventricular ejection fraction, by estimation, is 60 to 65%. The  left ventricle has normal function. The left ventricle has no regional  wall motion abnormalities. Left ventricular diastolic parameters are  consistent with Grade II diastolic  dysfunction (pseudonormalization).   2. Right ventricular systolic function is normal. The right ventricular  size is normal. Tricuspid regurgitation signal is inadequate for assessing  PA pressure.   3. No evidence of mitral valve regurgitation.   4. The aortic valve was not well visualized. Aortic valve regurgitation  is not visualized.   5.  The inferior vena cava is normal in size with greater than 50%  respiratory variability, suggesting right atrial pressure of 3 mmHg.   EKG: Vent. rate 70 BPM PR interval 100 ms QRS duration 97 ms QT/QTcB 517/558 ms P-R-T axes 46 111 121 Sinus rhythm Short PR interval Right axis deviation Low voltage, precordial leads Borderline repolarization abnormality Prolonged QT interval  Assessment and Plan: Principal Problem:   Pneumonia due to COVID-19 virus Superimposed on:   Chronic respiratory failure with hypoxia ([name]) Admit to PCU/inpatient. Continue supplemental oxygen . Scheduled and as needed bronchodilators. Incentive spirometry while awake. Flutter valve exercises. Antitussives as needed. Dexamethasone  6 mg IVP daily. Continue ceftriaxone  1 g IVPB daily. Continue azithromycin  500 mg IVPB daily. Check strep pneumoniae urinary antigen. Check  sputum Gram stain, culture and sensitivity. Follow-up blood culture and sensitivity. Follow-up CBC and chemistry in the morning.  Active Problems:   Cirrhosis of liver with ascites ([name]) Secondary to:   NAFLD (nonalcoholic fatty liver disease) No SBP. Therapeutic paracentesis done by IR appreciated.    Pancytopenia ([name]) In the setting of liver cirrhosis.    Type 2 diabetes mellitus with obesity ([name]) Carbohydrate modified diet. CBG monitoring 4 times daily. Begin RI SS as needed. Check hemoglobin A1c.    Essential hypertension Continue furosemide  40 mg p.o. daily tomorrow. Continue benazepril  40 mg p.o. daily. Continue clonidine  0.1 mg p.o. twice daily.    CAD (coronary artery disease) On benazepril . Holding statin for now.    Hypophosphatemia Replacing.    Hyperlipidemia associated with type 2 diabetes mellitus ([name]) Hold simvastatin  given liver cirrhosis.    Depression with anxiety Continue Lexapro  20 mg p.o. daily.    OSA (obstructive sleep apnea) Not using CPAP at home.     Advance Care Planning:   Code Status: Full Code   Consults: Interventional radiology.  Family Communication:   Severity of Illness: The appropriate patient status for this patient is INPATIENT. Inpatient status is judged to be reasonable and necessary in order to provide the required intensity of service to ensure the patient's safety. The patient's presenting symptoms, physical exam findings, and initial radiographic and laboratory data in the context of their chronic comorbidities is felt to place them at high risk for further clinical deterioration. Furthermore, it is not anticipated that the patient will be medically stable for discharge from the hospital within 2 midnights of admission.   * I certify that at the point of admission it is my clinical judgment that the patient will require inpatient hospital care spanning beyond 2 midnights from the point of admission due to high  intensity of service, high risk for further deterioration and high frequency of surveillance required.*  Author: Alm Dorn Castor, MD 10/25/2023 8:33 AM  For on call review [email or web address].   This document was prepared using Dragon voice recognition software and may contain some unintended transcription errors.

## 2023-10-25 NOTE — ED Notes (Signed)
 Dr. Leota Randy

## 2023-10-25 NOTE — ED Notes (Signed)
 Dr. Ranelle Buys at bedside performing Paracentesis with ultrasound guidance. 10ml of peritoneal fluid collected in syrine and sent to lab. Unsuccessful with any further fluid collection in containers. JRPRN

## 2023-10-25 NOTE — ED Notes (Signed)
Patient taking to [place]

## 2023-10-25 NOTE — ED Notes (Signed)
 CBG 75, apple juice given to patient. JRPRN

## 2023-10-25 NOTE — ED Provider Notes (Signed)
 [place] EMERGENCY DEPARTMENT AT [facility] Provider Note   CSN: [id number] Arrival date & time: 10/25/23  0229     History  Chief Complaint  Patient presents with   Abdominal Pain    AINE STRYCHARZ is a 63 y.o. female.  The history is provided by the patient.  Abdominal Pain KELLIANNE EK is a 63 y.o. female who presents to the Emergency Department complaining of abdominal pain.  She presents to the emergency department for nausea, vomiting and abdominal pain for the last 4 days.  She reports fevers at home to 102.3.  No associated diarrhea.  Last BM was today.  She does report increased cough.  She complains of pain in her entire abdomen with associated lower chest discomfort.  She thinks it may be secondary to reflux.  She also has chronic pain and cannot keep her pain meds down.  No associated dysuria.  She does have shortness of breath.  She is not on oxygen  at home.  She has a history of Hollie cirrhosis and CHF.  She does take daily Cipro .   Home Medications Prior to Admission medications   Medication Sig Start Date End Date Taking? Authorizing Provider  acetaminophen  (TYLENOL ) 500 MG tablet Take 1,000 mg by mouth every 6 (six) hours as needed for mild pain.    [provider]  albuterol  (PROVENTIL  HFA;VENTOLIN  HFA) 108 (90 BASE) MCG/ACT inhaler Inhale 2 puffs into the lungs every 6 (six) hours as needed for wheezing or shortness of breath.    [provider]  albuterol  (PROVENTIL ) (2.5 MG/3ML) 0.083% nebulizer solution Take 2.5 mg by nebulization every 6 (six) hours as needed for wheezing or shortness of breath.    [provider]  ALPRAZolam  (XANAX ) 1 MG tablet Take 1-2 mg by mouth See admin instructions. Take one tablet (1 mg) by mouth in the morning and two tablets (2 mg) in the evening.    [provider]  baclofen  (LIORESAL ) 10 MG tablet Take 1 tablet (10 mg total) by mouth 3 (three) times daily. Patient taking differently: Take 10  mg by mouth 3 (three) times daily as needed for muscle spasms. 12/07/22   Tobie Yetta CHRISTELLA, MD  benazepril  (LOTENSIN ) 40 MG tablet Take 40 mg by mouth daily.    [provider]  Benzocaine-Resorcinol (VAGISIL EX) Apply 1 application topically daily.    [provider]  bisacodyl  (DULCOLAX) 10 MG suppository Place 1 suppository (10 mg total) rectally daily as needed for moderate constipation. Patient not taking: Reported on 12/11/2022 12/07/22   Tobie Yetta CHRISTELLA, MD  budesonide  (PULMICORT ) 1 MG/2ML nebulizer solution Take 1 mg by nebulization daily as needed (shortness of breath). 04/08/21   [provider]  ciprofloxacin  (CIPRO ) 500 MG tablet Take 1 tablet (500 mg total) by mouth daily with breakfast. 08/18/22   Regalado, Belkys A, MD  cloNIDine  (CATAPRES ) 0.1 MG tablet Take 1 tablet (0.1 mg total) by mouth daily. 12/08/22   Tobie Yetta CHRISTELLA, MD  cyclobenzaprine  (FLEXERIL ) 10 MG tablet Take 10 mg by mouth daily as needed for muscle spasms.    [provider]  dicyclomine  (BENTYL ) 10 MG capsule Take 1 capsule (10 mg total) by mouth 4 (four) times daily -  before meals and at bedtime. Patient not taking: Reported on 01/29/2023 12/07/22   Tobie Yetta CHRISTELLA, MD  escitalopram  (LEXAPRO ) 20 MG tablet Take 20 mg by mouth daily.    [provider]  estradiol  (ESTRACE ) 2 MG tablet  Take 2 mg by mouth daily.     [provider]  fluticasone  (FLONASE ) 50 MCG/ACT nasal spray Place 1 spray into both nostrils daily as needed for allergies.    [provider]  furosemide  (LASIX ) 40 MG tablet Take 1 tablet (40 mg total) by mouth daily. Take additional 40 mg when you have a weight gain of 3 lbs in 1 day or 5 lbs in 1 week. 12/08/22   Tobie Yetta HERO, MD  Glycerin -Polysorbate 80 (REFRESH DRY EYE THERAPY OP) Place 1 drop into both eyes daily as needed (dry eyes).    [provider]  lactulose  (CHRONULAC ) 10 GM/15ML solution Take 15 mLs by mouth 2 (two) times daily  as needed for mild constipation. 10/22/22   [provider]  levocetirizine (XYZAL ) 5 MG tablet Take 5 mg by mouth every evening.  09/16/19   [provider]  lidocaine  (LIDODERM ) 5 % Place 1 patch onto the skin daily. Remove & Discard patch within 12 hours or as directed by MD Patient taking differently: Place 1 patch onto the skin daily as needed (pain). Remove & Discard patch within 12 hours or as directed by MD 11/07/22   Tegeler, Lonni PARAS, MD  lidocaine  (XYLOCAINE ) 5 % ointment Apply 1 application topically 2 (two) times daily as needed (hemorrhoids).    [provider]  linaclotide  (LINZESS ) 145 MCG CAPS capsule Take 1 capsule (145 mcg total) by mouth daily before breakfast. Patient not taking: Reported on 01/29/2023 12/08/22   Patel, Pranav M, MD  magnesium  oxide (MAG-OX) 400 (240 Mg) MG tablet Take 1 tablet (400 mg total) by mouth 2 (two) times daily. Patient not taking: Reported on 01/29/2023 12/07/22   Tobie Yetta HERO, MD  meloxicam  (MOBIC ) 15 MG tablet Take 15 mg by mouth daily.    [provider]  mupirocin ointment (BACTROBAN) 2 % Apply 1 Application topically 2 (two) times daily. Nose sore 10/27/22   [provider]  naloxone  (NARCAN ) 4 MG/0.1ML LIQD nasal spray kit Place 1 spray into the nose as needed (for overdose).     [provider]  nystatin  (MYCOSTATIN /NYSTOP ) powder Apply 1 g topically 4 (four) times daily as needed (irritation).    [provider]  ondansetron  (ZOFRAN ) 8 MG tablet Take 8 mg by mouth 2 (two) times daily as needed for nausea or vomiting.    [provider]  oxyCODONE  (ROXICODONE ) 15 MG immediate release tablet Take 1 tablet (15 mg total) by mouth every 8 (eight) hours as needed for pain. Patient taking differently: Take 15 mg by mouth every 4 (four) hours. Up to 6 tablets a day 03/29/21   Krishnan, Sendil K, MD  pantoprazole  (PROTONIX ) 40 MG tablet Take 40 mg by mouth 2 (two) times daily.      [provider]  polyethylene glycol (MIRALAX  / GLYCOLAX ) 17 g packet Take 17 g by mouth 2 (two) times daily. Patient taking differently: Take 17 g by mouth daily as needed for moderate constipation. 12/07/22   Tobie Yetta HERO, MD  prochlorperazine  (COMPAZINE ) 10 MG tablet Take 10 mg by mouth every 6 (six) hours as needed for nausea or vomiting.    [provider]  promethazine  (PHENERGAN ) 25 MG tablet Take 25 mg by mouth every 8 (eight) hours as needed for nausea or vomiting.    [provider]  rizatriptan (MAXALT-MLT) 10 MG disintegrating tablet Take 10 mg by mouth as needed for migraine. May repeat in 2 hours if needed Rizatriptan  benzoate 10mg  tablet disintegrating    [provider]  Semaglutide, 1 MG/DOSE, (OZEMPIC, 1 MG/DOSE,) 4 MG/3ML SOPN Inject 1 mg into the skin every Monday.    [provider]  Sennosides (SENNA LAX PO) Take 25 mg by mouth 2 (two) times daily.    [provider]  Simethicone  180 MG CAPS Take 180 mg by mouth 2 (two) times daily as needed for flatulence.    [provider]  Simethicone  80 MG TABS Take 1 tablet (80 mg total) by mouth in the morning, at noon, in the evening, and at bedtime. Patient not taking: Reported on 12/11/2022 12/07/22   Tobie Yetta HERO, MD  simvastatin  (ZOCOR ) 20 MG tablet Take 20 mg by mouth at bedtime.    [provider]  topiramate  (TOPAMAX ) 50 MG tablet Take 50 mg by mouth daily as needed (migraines).    [provider]  traZODone  (DESYREL ) 50 MG tablet Take 100 mg by mouth at bedtime. 09/05/22   [provider]  triamcinolone  cream (KENALOG ) 0.1 % Apply 1 application topically 2 (two) times daily as needed (rash).    [provider]      Allergies    Spironolactone , Bactrim [sulfamethoxazole-trimethoprim], Gabapentin, Amoxicillin , Clindamycin/lincomycin, Doxycycline , Influenza virus vaccine, Linzess  [linaclotide ], Metronidazole , and Treximet  [sumatriptan-naproxen sodium]    Review of Systems   Review of Systems  Gastrointestinal:  Positive for abdominal pain.  All other systems reviewed and are negative.   Physical Exam Updated Vital Signs BP (!) 177/90   Pulse 71   Temp 98.3 F (36.8 C) (Oral)   Resp 14   SpO2 93%  Physical Exam Vitals and nursing note reviewed.  Constitutional:      Appearance: She is well-developed.  HENT:     Head: Normocephalic and atraumatic.  Cardiovascular:     Rate and Rhythm: Normal rate and regular rhythm.     Heart sounds: No murmur heard. Pulmonary:     Effort: Pulmonary effort is normal. No respiratory distress.     Breath sounds: Normal breath sounds.  Abdominal:     General: There is distension.     Palpations: Abdomen is soft.     Tenderness: There is no guarding or rebound.     Comments: Moderate generalized abdominal tenderness  Musculoskeletal:        General: No swelling or tenderness.  Skin:    General: Skin is warm and dry.  Neurological:     Mental Status: She is alert and oriented to person, place, and time.  Psychiatric:        Behavior: Behavior normal.     ED Results / Procedures / Treatments   Labs (all labs ordered are listed, but only abnormal results are displayed) Labs Reviewed  RESP PANEL BY RT-PCR (RSV, FLU A&B, COVID)  RVPGX2 - Abnormal; Notable for the following components:      Result Value   SARS Coronavirus 2 by RT PCR POSITIVE (*)    All other components within normal limits  COMPREHENSIVE METABOLIC PANEL - Abnormal; Notable for the following components:   Potassium 3.0 (*)    CO2 33 (*)    BUN 5 (*)    Calcium 7.8 (*)    Albumin  3.0 (*)    All other components within normal limits  CBC WITH DIFFERENTIAL/PLATELET - Abnormal; Notable for the following components:   WBC 3.7 (*)    Hemoglobin 10.8 (*)    MCH 25.5 (*)    MCHC 29.9 (*)  Platelets 125 (*)    All other components within normal limits  PROTIME-INR - Abnormal; Notable for  the following components:   Prothrombin Time 15.6 (*)    All other components within normal limits  URINALYSIS, W/ REFLEX TO CULTURE (INFECTION SUSPECTED) - Abnormal; Notable for the following components:   APPearance HAZY (*)    Bacteria, UA RARE (*)    All other components within normal limits  CULTURE, BLOOD (ROUTINE X 2)  CULTURE, BLOOD (ROUTINE X 2)  APTT  LIPASE, BLOOD  I-STAT CG4 LACTIC ACID, ED  I-STAT CG4 LACTIC ACID, ED  CBG MONITORING, ED    EKG None  Radiology DG Chest Port 1 View Result Date: 10/25/2023 CLINICAL DATA:  63 year old female with possible sepsis. EXAM: PORTABLE CHEST 1 VIEW COMPARISON:  Chest radiographs 12/03/2022 and earlier. FINDINGS: Portable AP semi upright view at 0534 hours. Mildly improved lung volumes. Stable borderline to mild cardiomegaly. Other mediastinal contours are within normal limits. Visualized tracheal air column is within normal limits. No pneumothorax, pleural effusion, consolidation. Mild chronic increased pulmonary interstitial opacity, with new patchy and asymmetric right greater than left opacity now. Paucity of bowel gas.  No acute osseous abnormality identified. IMPRESSION: Patchy, asymmetric perihilar lung opacity more resembles multilobar infection than asymmetric pulmonary edema. No consolidation or pleural effusion. Electronically Signed   By: VEAR Hurst M.D.   On: 10/25/2023 06:58    Procedures Procedures    Medications Ordered in ED Medications  azithromycin  (ZITHROMAX ) 500 mg in sodium chloride  0.9 % 250 mL IVPB (has no administration in time range)  potassium chloride  (KLOR-CON ) packet 40 mEq (has no administration in time range)  ondansetron  (ZOFRAN ) injection 4 mg (has no administration in time range)  morphine  (PF) 4 MG/ML injection 4 mg (has no administration in time range)  morphine  (PF) 4 MG/ML injection 4 mg (has no administration in time range)  ondansetron  (ZOFRAN ) injection 4 mg (4 mg Intravenous Given 10/25/23 0458)   morphine  (PF) 4 MG/ML injection 4 mg (4 mg Intravenous Given 10/25/23 0458)  cefTRIAXone  (ROCEPHIN ) 2 g in sodium chloride  0.9 % 100 mL IVPB (0 g Intravenous Stopped 10/25/23 0713)  iohexol  (OMNIPAQUE ) 300 MG/ML solution 100 mL (100 mLs Intravenous Contrast Given 10/25/23 9370)    ED Course/ Medical Decision Making/ A&P                                 Medical Decision Making Amount and/or Complexity of Data Reviewed Labs: ordered. Radiology: ordered.  Risk Prescription drug management.   Patient with history of cirrhosis here for evaluation of abdominal pain, vomiting, diarrhea for the last 4 days with reports of fevers at home.  She also does have some associated shortness of breath.  She has abdominal tenderness on examination.  Overall she appears to be euvolemic.  She was treated empirically with antibiotics pending additional workup.  Lactic acid is within normal limits.  WBC is borderline low.  Chest x-ray with possible pneumonia.  Plan to obtain CT abdomen pelvis.  Patient care transferred pending CT abdomen pelvis, repeat evaluation.       Final Clinical Impression(s) / ED Diagnoses Final diagnoses:  None    Rx / DC Orders ED Discharge Orders     None         Griselda Norris, MD 10/25/23 [phone]

## 2023-10-25 NOTE — ED Provider Notes (Signed)
  Physical Exam  BP (!) 177/90   Pulse 71   Temp 98.3 F (36.8 C) (Oral)   Resp 14   SpO2 93%   Physical Exam Vitals and nursing note reviewed.  HENT:     Head: Normocephalic and atraumatic.  Eyes:     Pupils: Pupils are equal, round, and reactive to light.  Cardiovascular:     Rate and Rhythm: Normal rate and regular rhythm.  Pulmonary:     Effort: Pulmonary effort is normal.     Breath sounds: Normal breath sounds.  Abdominal:     General: There is distension.     Palpations: Abdomen is soft. There is fluid wave.     Tenderness: There is generalized abdominal tenderness.  Skin:    General: Skin is warm and dry.  Neurological:     Mental Status: She is alert.  Psychiatric:        Mood and Affect: Mood normal.     Procedures  ABDOMINAL PARACENTESIS  Date/Time: 10/25/2023 8:51 AM  Performed by: Pamella Ozell LABOR, DO Authorized by: Pamella Ozell LABOR, DO  Consent: Verbal consent obtained. Written consent not obtained. Consent given by: patient Patient identity confirmed: verbally with patient Time out: Immediately prior to procedure a time out was called to verify the correct patient, procedure, equipment, support staff and site/side marked as required. Preparation: Patient was prepped and draped in the usual sterile fashion. Local anesthesia used: yes Anesthesia: local infiltration  Anesthesia: Local anesthesia used: yes Local Anesthetic: lidocaine  2% without epinephrine  Anesthetic total: 10 mL  Sedation: Patient sedated: no  Comments: 3 cc bloody ascitic fluid collected and sent for laboratory testing     ED Course / MDM   Clinical Course as of 10/25/23 0853  [facility] Oct 25, 2023  0815 Diagnostic paracentesis completed with sample to lab.  Patient tolerated procedure well [MP]  [phone] Discussed with admitting hospitalist Dr. Celinda who accepts patient for admission [MP]    Clinical Course User Index [MP] Pamella Ozell LABOR, DO   Medical Decision Making I,  Ozell Pamella DO, have assumed care of this patient from the previous provider pending diagnostic paracentesis and admission  Amount and/or Complexity of Data Reviewed Labs: ordered. Radiology: ordered.  Risk Prescription drug management. Decision regarding hospitalization.          Pamella Ozell LABOR, DO 10/25/23 [phone]

## 2023-10-25 NOTE — ED Notes (Signed)
Patient ambulated to the bathroom. JRPRN 

## 2023-10-25 NOTE — ED Notes (Signed)
 Pt placed back on 3L [place] due to drop in O2

## 2023-10-25 NOTE — ED Triage Notes (Signed)
 Pt came in via EMS with N/V/D x 4 days. Hx of ascites and COPD. Usually has to be drained every couple of months. Pt believes this is an exacerbation. Pt states they took 8mg  of Zofran  around 11pm with no relief. Abdomen descended upon assessment.   EMS  190/55 HR 72 RR 16 O2 90% 3L 97%

## 2023-10-25 NOTE — ED Notes (Addendum)
 Pt is 97% on RA. Dr. Bonita Bussing notified to see if pt can be placed on a lower level of care. Dr. Bonita Bussing states possibly tomorrow pt can be moved to lower level of care

## 2023-10-26 DIAGNOSIS — U071 COVID-19: Secondary | ICD-10-CM | POA: Diagnosis not present

## 2023-10-26 DIAGNOSIS — J1282 Pneumonia due to coronavirus disease 2019: Secondary | ICD-10-CM | POA: Diagnosis not present

## 2023-10-26 LAB — CBC
HCT: 36.8 % (ref 36.0–46.0)
Hemoglobin: 10.8 g/dL — ABNORMAL LOW (ref 12.0–15.0)
MCH: 25.4 pg — ABNORMAL LOW (ref 26.0–34.0)
MCHC: 29.3 g/dL — ABNORMAL LOW (ref 30.0–36.0)
MCV: 86.6 fL (ref 80.0–100.0)
Platelets: 128 10*3/uL — ABNORMAL LOW (ref 150–400)
RBC: 4.25 MIL/uL (ref 3.87–5.11)
RDW: 15.6 % — ABNORMAL HIGH (ref 11.5–15.5)
WBC: 2.6 10*3/uL — ABNORMAL LOW (ref 4.0–10.5)
nRBC: 0 % (ref 0.0–0.2)

## 2023-10-26 LAB — COMPREHENSIVE METABOLIC PANEL
ALT: 14 U/L (ref 0–44)
AST: 32 U/L (ref 15–41)
Albumin: 3.2 g/dL — ABNORMAL LOW (ref 3.5–5.0)
Alkaline Phosphatase: 64 U/L (ref 38–126)
Anion gap: 7 (ref 5–15)
BUN: 6 mg/dL — ABNORMAL LOW (ref 8–23)
CO2: 35 mmol/L — ABNORMAL HIGH (ref 22–32)
Calcium: 8 mg/dL — ABNORMAL LOW (ref 8.9–10.3)
Chloride: 95 mmol/L — ABNORMAL LOW (ref 98–111)
Creatinine, Ser: 0.78 mg/dL (ref 0.44–1.00)
GFR, Estimated: >60 mL/min (ref >60)
Glucose, Bld: 147 mg/dL — ABNORMAL HIGH (ref 70–99)
Potassium: 3.7 mmol/L (ref 3.5–5.1)
Sodium: 137 mmol/L (ref 135–145)
Total Bilirubin: 0.8 mg/dL (ref 0.0–1.2)
Total Protein: 7.9 g/dL (ref 6.5–8.1)

## 2023-10-26 LAB — GLUCOSE, CAPILLARY
Glucose-Capillary: 152 mg/dL — ABNORMAL HIGH (ref 70–99)
Glucose-Capillary: 141 mg/dL — ABNORMAL HIGH (ref 70–99)
Glucose-Capillary: 151 mg/dL — ABNORMAL HIGH (ref 70–99)
Glucose-Capillary: 140 mg/dL — ABNORMAL HIGH (ref 70–99)

## 2023-10-26 LAB — HEMOGLOBIN A1C
Hgb A1c MFr Bld: 4.9 % (ref 4.8–5.6)
Mean Plasma Glucose: 93.93 mg/dL

## 2023-10-26 LAB — HIV ANTIBODY (ROUTINE TESTING W REFLEX): HIV Screen 4th Generation wRfx: NONREACTIVE

## 2023-10-26 LAB — PROCALCITONIN: Procalcitonin: 1.99 ng/mL

## 2023-10-26 MED ORDER — BUDESONIDE 0.25 MG/2ML IN SUSP
0.2500 mg | Freq: Two times a day (BID) | RESPIRATORY_TRACT | Status: DC
  Administered 2023-10-26 – 2023-10-31 (×11): 0.25 mg via RESPIRATORY_TRACT
  Filled 2023-10-26 (×11): qty 2

## 2023-10-26 MED ORDER — GLYCOPYRROLATE 1 MG PO TABS
1.0000 mg | ORAL_TABLET | Freq: Every day | ORAL | Status: DC | PRN
Start: 2023-10-26 — End: 2023-10-31

## 2023-10-26 MED ORDER — AZITHROMYCIN 500 MG PO TABS
500.0000 mg | ORAL_TABLET | Freq: Every day | ORAL | Status: AC
  Administered 2023-10-26 – 2023-10-29 (×4): 500 mg via ORAL
  Filled 2023-10-26: qty 2
  Filled 2023-10-26 (×3): qty 1

## 2023-10-26 MED ORDER — BISACODYL 10 MG RE SUPP
10.0000 mg | Freq: Every day | RECTAL | Status: DC | PRN
Start: 2023-10-26 — End: 2023-10-31

## 2023-10-26 MED ORDER — ALPRAZOLAM 0.5 MG PO TABS: 1.0000 mg | ORAL_TABLET | Freq: Every day | ORAL | Status: DC | PRN

## 2023-10-26 MED ORDER — PHENOL 1.4 % MT LIQD
1.0000 | OROMUCOSAL | Status: DC | PRN
  Administered 2023-10-26: 1 via OROMUCOSAL
  Filled 2023-10-26: qty 177

## 2023-10-26 MED ORDER — ARFORMOTEROL TARTRATE 15 MCG/2ML IN NEBU
15.0000 ug | INHALATION_SOLUTION | Freq: Two times a day (BID) | RESPIRATORY_TRACT | Status: DC
  Administered 2023-10-26 – 2023-10-31 (×11): 15 ug via RESPIRATORY_TRACT
  Filled 2023-10-26 (×11): qty 2

## 2023-10-26 MED ORDER — BACLOFEN 10 MG PO TABS
10.0000 mg | ORAL_TABLET | Freq: Three times a day (TID) | ORAL | Status: DC | PRN
  Administered 2023-10-26 – 2023-10-29 (×3): 10 mg via ORAL
  Filled 2023-10-26 (×3): qty 1

## 2023-10-26 MED ORDER — PANTOPRAZOLE SODIUM 40 MG PO TBEC
40.0000 mg | DELAYED_RELEASE_TABLET | Freq: Two times a day (BID) | ORAL | Status: DC
  Administered 2023-10-26 – 2023-10-31 (×10): 40 mg via ORAL
  Filled 2023-10-26 (×10): qty 1

## 2023-10-26 MED ORDER — ALPRAZOLAM 0.5 MG PO TABS
1.0000 mg | ORAL_TABLET | Freq: Once | ORAL | Status: AC
  Administered 2023-10-26: 1 mg via ORAL
  Filled 2023-10-26: qty 2

## 2023-10-26 MED ORDER — REVEFENACIN 175 MCG/3ML IN SOLN
175.0000 ug | Freq: Every day | RESPIRATORY_TRACT | Status: DC
  Administered 2023-10-26 – 2023-10-31 (×6): 175 ug via RESPIRATORY_TRACT
  Filled 2023-10-26 (×6): qty 3

## 2023-10-26 MED ORDER — METHYLPREDNISOLONE SODIUM SUCC 40 MG IJ SOLR
40.0000 mg | INTRAMUSCULAR | Status: DC
  Administered 2023-10-26 – 2023-10-28 (×3): 40 mg via INTRAVENOUS
  Filled 2023-10-26 (×3): qty 1

## 2023-10-26 MED ORDER — CETIRIZINE HCL 10 MG PO TABS
10.0000 mg | ORAL_TABLET | Freq: Every evening | ORAL | Status: DC
  Administered 2023-10-26 – 2023-10-30 (×5): 10 mg via ORAL
  Filled 2023-10-26 (×5): qty 1

## 2023-10-26 MED ORDER — ALPRAZOLAM 0.5 MG PO TABS
2.0000 mg | ORAL_TABLET | Freq: Every day | ORAL | Status: DC
  Administered 2023-10-26 – 2023-10-30 (×5): 2 mg via ORAL
  Filled 2023-10-26 (×5): qty 4

## 2023-10-26 MED ORDER — HYDROMORPHONE HCL 1 MG/ML IJ SOLN
0.5000 mg | INTRAMUSCULAR | Status: DC | PRN
  Administered 2023-10-26 – 2023-10-29 (×10): 0.5 mg via INTRAVENOUS
  Filled 2023-10-26 (×11): qty 0.5

## 2023-10-26 MED ORDER — ESTRADIOL 0.5 MG PO TABS
2.0000 mg | ORAL_TABLET | Freq: Every day | ORAL | Status: DC
  Administered 2023-10-26 – 2023-10-31 (×6): 2 mg via ORAL
  Filled 2023-10-26 (×6): qty 4

## 2023-10-26 MED ORDER — ALPRAZOLAM 0.5 MG PO TABS: 1.0000 mg | ORAL_TABLET | ORAL | Status: DC

## 2023-10-26 MED ORDER — GUAIFENESIN-DM 100-10 MG/5ML PO SYRP
5.0000 mL | ORAL_SOLUTION | ORAL | Status: DC | PRN
  Administered 2023-10-27 – 2023-10-28 (×3): 5 mL via ORAL
  Filled 2023-10-26 (×3): qty 10

## 2023-10-26 MED ORDER — LEVOCETIRIZINE DIHYDROCHLORIDE 5 MG PO TABS: 5.0000 mg | ORAL_TABLET | Freq: Every evening | ORAL | Status: DC

## 2023-10-26 MED ORDER — FLUTICASONE PROPIONATE 50 MCG/ACT NA SUSP: 1.0000 | Freq: Every day | NASAL | Status: DC | PRN

## 2023-10-26 MED ORDER — SALINE SPRAY 0.65 % NA SOLN
1.0000 | NASAL | Status: DC | PRN
  Filled 2023-10-26: qty 44

## 2023-10-26 NOTE — Plan of Care (Signed)
  Problem: Education: Goal: Knowledge of risk factors and measures for prevention of condition will improve Outcome: Progressing   Problem: Respiratory: Goal: Will maintain a patent airway Outcome: Progressing   Problem: Health Behavior/Discharge Planning: Goal: Ability to manage health-related needs will improve Outcome: Progressing   

## 2023-10-26 NOTE — Progress Notes (Addendum)
 PROGRESS NOTE [name]  ZOX:[id number] DOB: [birth year] DOA: 10/25/2023 PCP: Roselind Congo, MD  Brief Narrative/Hospital Course: 63 YOF W/ history of chronic abdominal pain, chronic respiratory failure with hypoxia, anxiety, depression, osteoarthritis, nonalcoholic liver cirrhosis last paracentesis ON 09/17/23 with 3.1 L of blood-tinged fluid who presented to the ED w/ complaints of exacerbation of chronic abdominal pain associated with fever, chills, fatigue, malaise, decreased appetite, nausea and vomiting along with dyspnea,wheezing and having an occasional productive cough at times. In the ED: Vitals stable afebrile BP on higher side, labs showed hypokalemia 3.0 normal LFTs, renal function and chronic anemia with hemoglobin 10.8 g and platelet 1 25K, COVID-19 with positive UA unremarkable. CXR>>patchy, symmetric perihilar lung opacity more resembling multilobar infection then asymmetric pulmonary edema.  No consolidation or pleural effusion. CT abdomen/pelvis with contrast>>groundglass opacity in the lung bases bilateral suspicious for pneumonia.  Mildly enlarged subcarinal lymph node is partially visualized measuring up to 1.5 cm most likely inflammatory, however incompletely evaluated on this examination.  Progressive cirrhosis with moderate abdominal ascites. Patient was given electrolyte replacement antiemetics antibiotics and admitted    Subjective: Seen this morning Has been off oxygen  for 20 minutes for eating breakfast we checked her oxygen  saturating at 92-95 RA at REST Overall feels better with mild cough Reports She has history of COPD AND asthma and uses vape, does not use CPAP at home   Assessment and Plan: Principal Problem:   Pneumonia due to COVID-19 virus Active Problems:   Cirrhosis of liver with ascites ([name])   Chronic respiratory failure with hypoxia ([name])   Type 2 diabetes mellitus with obesity ([name])   CAD (coronary artery disease)   Hypophosphatemia    Hyperlipidemia associated with type 2 diabetes mellitus ([name])   Depression with anxiety   OSA (obstructive sleep apnea)   NAFLD (nonalcoholic fatty liver disease)   Pancytopenia ([name])   Essential hypertension   COVID-19 virus infection with pneumonia Acute hypoxic respiratory failure Acute COPD exacerbation due to COVID: Patient tested positive for COVID 19 having respiratory symptoms and imaging shows groundglass opacity in the lung bases bilaterally.  Continue empiric antibiotics, bronchodilators AND change Decadron  to IV Solu-Medrol  due to ongoing wheezing, check procalcitonin, encourage vaping cessation.  Add will Pulmicort , Brovana  and Yupelri  nebulizer.  This morning able to come off oxygen   Decompensated liver cirrhosis with ascites NAFLD: Paracentesis done by IR with 3 cc fluid collected for analysis-Gram stain no growth, LDH 105.  No cell counts obtained. +2.2 liter removed today  Pancytopenia:  In the setting of liver cirrhosis/viral illness. monitor  Type 2 diabetes mellitus with obesity: A1c stable blood sugar control keep on SSI-monitor while on steroid Recent Labs  Lab 10/25/23 0827 10/25/23 1556 10/25/23 1714 10/25/23 2117 10/26/23 0503 10/26/23 0847  GLUCAP 75 124* 160* 175*  --  152*  HGBA1C  --   --   --   --  4.9  --     Essential hypertension BP well-controlled continue home Lasix , Benzapril and clonidine     CAD HLD No chest pain.Holding statin    Hypophosphatemia Replaced  Depression with anxiety Mood stable on Lexapro  20 mg p.o. daily.   OSA Not using CPAP at home at baseline.  Chronic opiate/chronic pain: Continue home oxy  DVT prophylaxis: SCDs Start: 10/25/23 0956 Code Status:   Code Status: Full Code Family Communication: plan of care discussed with patient at bedside. Patient status is: Remains hospitalized because of severity of illness Level of care: Progressive  Dispo: The patient is from: Lives with husband             Anticipated disposition: TBD Objective: Vitals last 24 hrs: Vitals:   10/26/23 0309 10/26/23 0600 10/26/23 0723 10/26/23 0753  BP: (!) 166/80  125/71   Pulse: 63  69   Resp: 16 18 20    Temp: 98.3 F (36.8 C)  98.1 F (36.7 C)   TempSrc: Oral     SpO2: 98%  100% 99%   Weight change:   Physical Examination: General exam: alert awake,at baseline, older than stated age HEENT:Oral mucosa moist, Ear/Nose WNL grossly Respiratory system: With diffuse wheezing,no use of accessory muscle Cardiovascular system: S1 & S2 +, No JVD. Gastrointestinal system: Abdomen soft,NT,ND, BS+ Nervous System: Alert, awake, moving all extremities,and following commands. Extremities: LE edema neg,distal peripheral pulses palpable and warm.  Skin: No rashes,no icterus. MSK: Normal muscle bulk,tone, power   Medications reviewed:  Scheduled Meds:  arformoterol   15 mcg Nebulization BID   benazepril   40 mg Oral Daily   budesonide  (PULMICORT ) nebulizer solution  0.25 mg Nebulization BID   cloNIDine   0.2 mg Oral Daily   escitalopram   20 mg Oral Daily   furosemide   40 mg Oral Daily   methylPREDNISolone  (SOLU-MEDROL ) injection  40 mg Intravenous Q24H   oxyCODONE   20 mg Oral 5 X Daily   revefenacin   175 mcg Nebulization Daily   traZODone   100 mg Oral QHS   Continuous Infusions:  azithromycin      cefTRIAXone  (ROCEPHIN )  IV      Diet Order             Diet heart healthy/carb modified Room service appropriate? Yes; Fluid consistency: Thin; Fluid restriction: 1200 mL Fluid  Diet effective now                  Intake/Output Summary (Last 24 hours) at 10/26/2023 0903 Last data filed at 10/26/2023 0835 Gross per 24 hour  Intake 970 ml  Output --  Net 970 ml   Net IO Since Admission: 1,070 mL [10/26/23 0903]  Wt Readings from Last 3 Encounters:  09/11/23 103.4 kg  06/29/23 98.1 kg  02/03/23 96.6 kg     Unresulted Labs (From admission, onward)     Start     Ordered   10/26/23 0500  HIV Antibody  (routine testing w rflx)  (HIV Antibody (Routine testing w reflex) panel)  Tomorrow morning,   R        10/25/23 0957          Data Reviewed: I have personally reviewed following labs and imaging studies CBC: Recent Labs  Lab 10/25/23 0454 10/26/23 0503  WBC 3.7* 2.6*  NEUTROABS 2.5  --   HGB 10.8* 10.8*  HCT 36.1 36.8  MCV 85.3 86.6  PLT 125* 128*   Basic Metabolic Panel:  Recent Labs  Lab 10/25/23 0451 10/25/23 0454 10/26/23 0503  NA  --  140 137  K  --  3.0* 3.7  CL  --  98 95*  CO2  --  33* 35*  GLUCOSE  --  87 147*  BUN  --  5* 6*  CREATININE  --  0.69 0.78  CALCIUM  --  7.8* 8.0*  MG 1.9  --   --   PHOS 2.1*  --   --    GFR: CrCl cannot be calculated (Unknown ideal weight.). Liver Function Tests:  Recent Labs  Lab 10/25/23 0454 10/26/23 0503  AST 29  32  ALT 12 14  ALKPHOS 62 64  BILITOT 0.9 0.8  PROT 7.6 7.9  ALBUMIN  3.0* 3.2*   Recent Labs  Lab 10/25/23 0454  LIPASE 33   No results for input(s): "AMMONIA" in the last 168 hours. Coagulation Profile:  Recent Labs  Lab 10/25/23 0454  INR 1.2   No results for input(s): "PROBNP" in the last 168 hours.  Recent Labs    10/26/23 0503  HGBA1C 4.9   Recent Labs  Lab 10/25/23 0827 10/25/23 1556 10/25/23 1714 10/25/23 2117 10/26/23 0847  GLUCAP 75 124* 160* 175* 152*   No results for input(s): "CHOL", "HDL", "LDLCALC", "TRIG", "CHOLHDL", "LDLDIRECT" in the last 72 hours. No results for input(s): "TSH", "T4TOTAL", "FREET4", "T3FREE", "THYROIDAB" in the last 72 hours. Sepsis Labs: Recent Labs  Lab 10/25/23 0451 10/25/23 0507 10/26/23 0503  PROCALCITON 3.50  --  1.99  LATICACIDVEN  --  0.7  --    Recent Results (from the past 240 hours)  Resp panel by RT-PCR (RSV, Flu A&B, Covid) Anterior Nasal Swab     Status: Abnormal   Collection Time: 10/25/23  4:13 AM   Specimen: Anterior Nasal Swab  Result Value Ref Range Status   SARS Coronavirus 2 by RT PCR POSITIVE (A) NEGATIVE Final     Comment: (NOTE) SARS-CoV-2 target nucleic acids are DETECTED.  The SARS-CoV-2 RNA is generally detectable in upper respiratory specimens during the acute phase of infection. Positive results are indicative of the presence of the identified virus, but do not rule out bacterial infection or co-infection with other pathogens not detected by the test. Clinical correlation with patient history and other diagnostic information is necessary to determine patient infection status. The expected result is Negative.  Fact Sheet for Patients: BloggerCourse.com  Fact Sheet for Healthcare Providers: SeriousBroker.it  This test is not yet approved or cleared by the [place]  FDA and  has been authorized for detection and/or diagnosis of SARS-CoV-2 by FDA under an Emergency Use Authorization (EUA).  This EUA will remain in effect (meaning this test can be used) for the duration of  the COVID-19 declaration under Section 564(b)(1) of the A ct, 21 U.S.C. section 360bbb-3(b)(1), unless the authorization is terminated or revoked sooner.     Influenza A by PCR NEGATIVE NEGATIVE Final   Influenza B by PCR NEGATIVE NEGATIVE Final    Comment: (NOTE) The Xpert Xpress SARS-CoV-2/FLU/RSV plus assay is intended as an aid in the diagnosis of influenza from Nasopharyngeal swab specimens and should not be used as a sole basis for treatment. Nasal washings and aspirates are unacceptable for Xpert Xpress SARS-CoV-2/FLU/RSV testing.  Fact Sheet for Patients: BloggerCourse.com  Fact Sheet for Healthcare Providers: SeriousBroker.it  This test is not yet approved or cleared by the [place]  FDA and has been authorized for detection and/or diagnosis of SARS-CoV-2 by FDA under an Emergency Use Authorization (EUA). This EUA will remain in effect (meaning this test can be used) for the duration of  the COVID-19 declaration under Section 564(b)(1) of the Act, 21 U.S.C. section 360bbb-3(b)(1), unless the authorization is terminated or revoked.     Resp Syncytial Virus by PCR NEGATIVE NEGATIVE Final    Comment: (NOTE) Fact Sheet for Patients: BloggerCourse.com  Fact Sheet for Healthcare Providers: SeriousBroker.it  This test is not yet approved or cleared by the [place]  FDA and has been authorized for detection and/or diagnosis of SARS-CoV-2 by FDA under an Emergency Use Authorization (EUA). This EUA will remain in effect (  meaning this test can be used) for the duration of the COVID-19 declaration under Section 564(b)(1) of the Act, 21 U.S.C. section 360bbb-3(b)(1), unless the authorization is terminated or revoked.  Performed at [facility], [address], [place], [place] [address]   Blood Culture (routine x 2)     Status: None (Preliminary result)   Collection Time: 10/25/23  4:54 AM   Specimen: BLOOD  Result Value Ref Range Status   Specimen Description   Final    BLOOD BLOOD RIGHT ARM Performed at [facility], [address] [place], [place] [address]    Special Requests   Final    Blood Culture adequate volume BOTTLES DRAWN AEROBIC AND ANAEROBIC Performed at [facility], [address] [place], [place] [address]    Culture   Final    NO GROWTH < 12 HOURS Performed at [facility], [address], [place], [place] [address]    Report Status PENDING  Incomplete  Blood Culture (routine x 2)     Status: None (Preliminary result)   Collection Time: 10/25/23  4:54 AM   Specimen: BLOOD  Result Value Ref Range Status   Specimen Description   Final    BLOOD LEFT ANTECUBITAL Performed at [facility], [address] [place], [place] [address]    Special Requests   Final    Blood Culture adequate volume BOTTLES DRAWN AEROBIC  AND ANAEROBIC Performed at [facility], [address] [place], [place] [address]    Culture   Final    NO GROWTH < 12 HOURS Performed at [facility], [address], [place], [place] [address]    Report Status PENDING  Incomplete  Body fluid culture w Gram Stain     Status: None (Preliminary result)   Collection Time: 10/25/23  8:19 AM   Specimen: Peritoneal Cavity; Peritoneal Fluid  Result Value Ref Range Status   Specimen Description   Final    PERITONEAL CAVITY Performed at [facility], [address], [place], [place] [address]    Special Requests   Final    NONE Performed at [facility], [address] [place], [place] [address]    Gram Stain   Final    NO WBC SEEN NO ORGANISMS SEEN Performed at [facility], [address] [place], [place] [address]    Culture PENDING  Incomplete   Report Status PENDING  Incomplete    Antimicrobials/Microbiology: Anti-infectives (From admission, onward)    Start     Dose/Rate Route Frequency Ordered Stop   10/26/23 1000  azithromycin  (ZITHROMAX ) 500 mg in sodium chloride  0.9 % 250 mL IVPB        500 mg 250 mL/hr over 60 Minutes Intravenous Every 24 hours 10/25/23 0957 10/30/23 0959   10/26/23 0600  cefTRIAXone  (ROCEPHIN ) 2 g in sodium chloride  0.9 % 100 mL IVPB        2 g 200 mL/hr over 30 Minutes Intravenous Every 24 hours 10/25/23 0957 10/30/23 0559   10/25/23 0715  azithromycin  (ZITHROMAX ) 500 mg in sodium chloride  0.9 % 250 mL IVPB        500 mg 250 mL/hr over 60 Minutes Intravenous  Once 10/25/23 0712 10/25/23 1225   10/25/23 0545  cefTRIAXone  (ROCEPHIN ) 2 g in sodium chloride  0.9 % 100 mL IVPB        2 g 200 mL/hr over 30 Minutes Intravenous  Once 10/25/23 0532 10/25/23 0713         Component Value Date/Time   SDES  10/25/2023 0819    PERITONEAL CAVITY Performed at [facility], [address], [place], [place] [address]     SPECREQUEST  10/25/2023 3474    NONE Performed at [facility], [address] [place], [place] [address]    CULT PENDING 10/25/2023 [phone]   REPTSTATUS PENDING 10/25/2023 0819     Radiology Studies: CT ABDOMEN PELVIS W CONTRAST Result Date: 10/25/2023 CLINICAL DATA:  Sepsis.  Abdominal pain. EXAM: CT ABDOMEN AND PELVIS WITH CONTRAST TECHNIQUE: Multidetector CT imaging of the abdomen and pelvis was performed using the standard protocol following bolus administration of intravenous contrast. RADIATION DOSE REDUCTION: This exam was performed according to the departmental dose-optimization program which includes automated exposure control, adjustment of the mA and/or kV according to patient size and/or use of iterative reconstruction technique. CONTRAST:  OMNIPAQUE  IOHEXOL  300 MG/ML  SOLN COMPARISON:  12/05/2022 FINDINGS: Lower chest: Ground-glass opacities seen in the lung bases bilaterally suspicious for pneumonia. Enlarged subcarinal node partially visualized measuring up to 1.5 cm in short axis. Hepatobiliary: Nodular shrunken liver consistent with cirrhosis. These changes have progressed since prior CT from 12/05/2022. No suspicious liver lesion is identified. The gallbladder is mildly distended but otherwise unremarkable. No bile duct dilatation is present. Pancreas: Unremarkable. No pancreatic ductal dilatation or surrounding inflammatory changes. Spleen: 1.8 cm low-density lesion within the spleen does not appear significantly changed in size dating back to 08/11/2022 which favors a benign etiology such as a cyst or hemangioma. Smaller adjacent lesion measuring 0.5 cm is also unchanged since 08/11/2022 and favored to be a benign etiology. Adrenals/Urinary Tract: Adrenal glands are normal. 1.2 cm simple cyst present in the lower pole of the right kidney does not require dedicated imaging surveillance. Additional subcentimeter right renal hypodensities are too small to fully  characterize but also likely represent simple cysts and do not require dedicated imaging surveillance. No hydronephrosis or hydroureter. Evaluation of the bladder is limited due to underdistention. There is grossly unremarkable. Stomach/Bowel: No significant abnormality of the stomach or doudenum. No bowel dilatation to indicate ileus or obstruction. The appendix is not identified Vascular/Lymphatic: Mild scattered atheromatous plaque of the abdominal aorta. Hepatic, portal, splenic, and superior mesenteric veins are patent. No enlarged abdominal or pelvic lymph nodes. Reproductive: Status post hysterectomy. No adnexal masses. Other: No abnormality of the abdominal wall. Moderate abdominal ascites. Musculoskeletal: Chronic bilateral L5 pars defects with grade 1 anterolisthesis of L5 on S1. No acute osseous abnormality. IMPRESSION: 1. Ground-glass opacities in the lung bases bilaterally suspicious for pneumonia. Mildly enlarged subcarinal lymph node is partially visualized measuring up to 1.5 cm most likely inflammatory, however incompletely evaluated on this examination. 2. Progressive cirrhosis with moderate abdominal ascites. Electronically Signed   By: Elester Grim M.D.   On: 10/25/2023 07:36   DG Chest Port 1 View Result Date: 10/25/2023 CLINICAL DATA:  63 year old female with possible sepsis. EXAM: PORTABLE CHEST 1 VIEW COMPARISON:  Chest radiographs 12/03/2022 and earlier. FINDINGS: Portable AP semi upright view at 0534 hours. Mildly improved lung volumes. Stable borderline to mild cardiomegaly. Other mediastinal contours are within normal limits. Visualized tracheal air column is within normal limits. No pneumothorax, pleural effusion, consolidation. Mild chronic increased pulmonary interstitial opacity, with new patchy and asymmetric right greater than left opacity now. Paucity of bowel gas.  No acute osseous abnormality identified. IMPRESSION: Patchy, asymmetric perihilar lung opacity more  resembles  multilobar infection than asymmetric pulmonary edema. No consolidation or pleural effusion. Electronically Signed   By: Marlise Simpers M.D.   On: 10/25/2023 06:58     LOS: 1 day   Total time spent in review of labs and imaging, patient evaluation, formulation of plan, documentation and communication with family: 50 minutes  Lesa Rape, MD  [facility]  10/26/2023, 9:03 AM

## 2023-10-26 NOTE — Hospital Course (Addendum)
 62 YOF W/ history of chronic abdominal pain, chronic respiratory failure with hypoxia, anxiety, depression, osteoarthritis, nonalcoholic liver cirrhosis last paracentesis on 09/17/23 with 3.1 L of blood-tinged fluid who presented to the ED w/ complaints of exacerbation of chronic abdominal pain associated with fever, chills, fatigue, malaise, decreased appetite, nausea and vomiting along with dyspnea,wheezing and having an occasional productive cough at times.  Symptoms have been going for several days. In the ED: Vitals stable afebrile BP on higher side, labs showed hypokalemia 3.0 normal LFTs, renal function and chronic anemia with hemoglobin 10.8 g and platelet 1 25K, COVID-19 with positive UA unremarkable. CXR>>patchy, symmetric perihilar lung opacity more resembling multilobar infection then asymmetric pulmonary edema.  No consolidation or pleural effusion. CT abdomen/pelvis with contrast>>groundglass opacity in the lung bases bilateral suspicious for pneumonia.  Mildly enlarged subcarinal lymph node is partially visualized measuring up to 1.5 cm most likely inflammatory, however incompletely evaluated on this examination.  Progressive cirrhosis with moderate abdominal ascites. Patient was given electrolyte replacement antiemetics antibiotics and admitted Overall patient remains hemodynamically stable afebrile labs ok.  She was complaining of left buttock bone underwent CT pelvis along with duplex lower extremity no acute finding

## 2023-10-26 NOTE — Progress Notes (Signed)
 Patient status post paracentesjs from left later abdomen with 2.2 liters blood tinged fluid removed.  No complications.  EBL 0ml

## 2023-10-27 ENCOUNTER — Inpatient Hospital Stay (HOSPITAL_COMMUNITY): Payer: 59

## 2023-10-27 ENCOUNTER — Ambulatory Visit: Payer: 59 | Admitting: Cardiology

## 2023-10-27 DIAGNOSIS — M7989 Other specified soft tissue disorders: Secondary | ICD-10-CM

## 2023-10-27 DIAGNOSIS — U071 COVID-19: Secondary | ICD-10-CM | POA: Diagnosis not present

## 2023-10-27 DIAGNOSIS — J1282 Pneumonia due to coronavirus disease 2019: Secondary | ICD-10-CM | POA: Diagnosis not present

## 2023-10-27 LAB — COMPREHENSIVE METABOLIC PANEL
ALT: 13 U/L (ref 0–44)
AST: 28 U/L (ref 15–41)
Albumin: 2.8 g/dL — ABNORMAL LOW (ref 3.5–5.0)
Alkaline Phosphatase: 59 U/L (ref 38–126)
Anion gap: 6 (ref 5–15)
BUN: 11 mg/dL (ref 8–23)
CO2: 36 mmol/L — ABNORMAL HIGH (ref 22–32)
Calcium: 7.8 mg/dL — ABNORMAL LOW (ref 8.9–10.3)
Chloride: 94 mmol/L — ABNORMAL LOW (ref 98–111)
Creatinine, Ser: 0.86 mg/dL (ref 0.44–1.00)
GFR, Estimated: >60 mL/min (ref >60)
Glucose, Bld: 128 mg/dL — ABNORMAL HIGH (ref 70–99)
Potassium: 3.5 mmol/L (ref 3.5–5.1)
Sodium: 136 mmol/L (ref 135–145)
Total Bilirubin: 0.7 mg/dL (ref 0.0–1.2)
Total Protein: 7.1 g/dL (ref 6.5–8.1)

## 2023-10-27 LAB — CBC
HCT: 33.9 % — ABNORMAL LOW (ref 36.0–46.0)
Hemoglobin: 9.7 g/dL — ABNORMAL LOW (ref 12.0–15.0)
MCH: 25 pg — ABNORMAL LOW (ref 26.0–34.0)
MCHC: 28.6 g/dL — ABNORMAL LOW (ref 30.0–36.0)
MCV: 87.4 fL (ref 80.0–100.0)
Platelets: 104 10*3/uL — ABNORMAL LOW (ref 150–400)
RBC: 3.88 MIL/uL (ref 3.87–5.11)
RDW: 15.6 % — ABNORMAL HIGH (ref 11.5–15.5)
WBC: 5.2 10*3/uL (ref 4.0–10.5)
nRBC: 0 % (ref 0.0–0.2)

## 2023-10-27 LAB — GLUCOSE, CAPILLARY
Glucose-Capillary: 119 mg/dL — ABNORMAL HIGH (ref 70–99)
Glucose-Capillary: 221 mg/dL — ABNORMAL HIGH (ref 70–99)
Glucose-Capillary: 235 mg/dL — ABNORMAL HIGH (ref 70–99)
Glucose-Capillary: 152 mg/dL — ABNORMAL HIGH (ref 70–99)

## 2023-10-27 LAB — C-REACTIVE PROTEIN: CRP: 3.8 mg/dL — ABNORMAL HIGH (ref <1.0)

## 2023-10-27 MED ORDER — IOHEXOL 300 MG/ML  SOLN
100.0000 mL | Freq: Once | INTRAMUSCULAR | Status: AC | PRN
  Administered 2023-10-27: 100 mL via INTRAVENOUS

## 2023-10-27 NOTE — Progress Notes (Signed)
Bilateral lower extremity venous duplex has been completed. Preliminary results can be found in CV Proc through chart review.   10/27/23 1:36 PM Olen Cordial RVT

## 2023-10-27 NOTE — Plan of Care (Signed)
  Problem: Education: Goal: Knowledge of risk factors and measures for prevention of condition will improve Outcome: Progressing   Problem: Coping: Goal: Psychosocial and spiritual needs will be supported Outcome: Progressing   Problem: Coping: Goal: Ability to adjust to condition or change in health will improve Outcome: Progressing   Problem: Nutritional: Goal: Progress toward achieving an optimal weight will improve Outcome: Progressing

## 2023-10-27 NOTE — Progress Notes (Signed)
PROGRESS NOTE [name]  ZOX:[id number] DOB: [birth year] DOA: 10/25/2023 PCP: Noberto Retort, MD  Brief Narrative/Hospital Course: 4 YOF W/ history of chronic abdominal pain, chronic respiratory failure with hypoxia, anxiety, depression, osteoarthritis, nonalcoholic liver cirrhosis last paracentesis ON 09/17/23 with 3.1 L of blood-tinged fluid who presented to the ED w/ complaints of exacerbation of chronic abdominal pain associated with fever, chills, fatigue, malaise, decreased appetite, nausea and vomiting along with dyspnea,wheezing and having an occasional productive cough at times. In the ED: Vitals stable afebrile BP on higher side, labs showed hypokalemia 3.0 normal LFTs, renal function and chronic anemia with hemoglobin 10.8 g and platelet 1 25K, COVID-19 with positive UA unremarkable. CXR>>patchy, symmetric perihilar lung opacity more resembling multilobar infection then asymmetric pulmonary edema.  No consolidation or pleural effusion. CT abdomen/pelvis with contrast>>groundglass opacity in the lung bases bilateral suspicious for pneumonia.  Mildly enlarged subcarinal lymph node is partially visualized measuring up to 1.5 cm most likely inflammatory, however incompletely evaluated on this examination.  Progressive cirrhosis with moderate abdominal ascites. Patient was given electrolyte replacement antiemetics antibiotics and admitted Overall patient remains hemodynamically stable afebrile labs ok     Subjective: Patient seen examined this morning Complains of spells of cough, Overnight afebrile, she is needing 2 L of cannula-continue home oxygen that she uses prn Labs appears stable with leukopenia resolved.  Renal function stable.  Complains of chronic pain on the left buttock area  Assessment and Plan: Principal Problem:   Pneumonia due to COVID-19 virus Active Problems:   Cirrhosis of liver with ascites ([name])   Chronic respiratory failure with hypoxia ([name])   Type 2 diabetes  mellitus with obesity ([name])   CAD (coronary artery disease)   Hypophosphatemia   Hyperlipidemia associated with type 2 diabetes mellitus ([name])   Depression with anxiety   OSA (obstructive sleep apnea)   NAFLD (nonalcoholic fatty liver disease)   Pancytopenia ([name])   Essential hypertension   COVID-19 virus infection with pneumonia Chronic hypoxic respiratory failure Acute COPD exacerbation due to COVID: Patient tested positive for COVID 19 having respiratory symptoms and imaging shows groundglass opacity in the lung bases bilaterally.  Continue empiric antibiotics, bronchodilators, Solu-Medrol.  Patient on home oxygen 2 L-continue the same.  Procalcitonin elevated 1.9 continue antibiotics as above.  Encouraged vaping cessation.   Cont Pulmicort, Brovana and Yupelri nebulizer.  Recent Labs  Lab 10/25/23 0451 10/25/23 0454 10/25/23 0507 10/26/23 0503 10/27/23 0531  WBC  --  3.7*  --  2.6* 5.2  LATICACIDVEN  --   --  0.7  --   --   PROCALCITON 3.50  --   --  1.99  --     Decompensated liver cirrhosis with ascites NAFLD: Underwent paracentesis with 2.2 fluid removal 2/9  Pancytopenia:  In the setting of liver cirrhosis/viral illness. Monitor   Type 2 diabetes mellitus with obesity: A1c stable blood sugar borderline controlled due to steroids.  Continue SSI and monitor Recent Labs  Lab 10/26/23 0503 10/26/23 0847 10/26/23 1117 10/26/23 1642 10/26/23 2014 10/27/23 0740 10/27/23 1124  GLUCAP  --    < > 141* 151* 140* 119* 221*  HGBA1C 4.9  --   --   --   --   --   --    < > = values in this interval not displayed.    Essential hypertension BP well-controlled continue home Lasix, Benzapril and clonidine    CAD HLD No chest pain.Holding statin    Hypophosphatemia Replaced  Depression  with anxiety Mood stable on Lexapro 20 mg p.o. daily.   OSA Not using CPAP at home at baseline.  Chronic opiate/chronic pain: Left buttock pain: Continue home oxy.  Check duplex  of the leg, CT pelvis  DVT prophylaxis: SCDs Start: 10/25/23 0956 Code Status:   Code Status: Full Code Family Communication: plan of care discussed with patient at bedside. Patient status is: Remains hospitalized because of severity of illness Level of care: Progressive   Dispo: The patient is from: Lives with husband            Anticipated disposition: TBD Objective: Vitals last 24 hrs: Vitals:   10/27/23 0902 10/27/23 0907 10/27/23 1000 10/27/23 1100  BP:      Pulse:      Resp:   (!) 21 20  Temp:      TempSrc:      SpO2: (!) 87% 93%     Weight change:   Physical Examination: General exam: alert awake, oriented at baseline, obese older than stated age HEENT:Oral mucosa moist, Ear/Nose WNL grossly Respiratory system: Bilaterally diminished with mild wheezing,no use of accessory muscle Cardiovascular system: S1 & S2 +, No JVD. Gastrointestinal system: Abdomen soft,NT,ND, BS+ Nervous System: Alert, awake, moving all extremities,and following commands. Extremities: LE edema neg,distal peripheral pulses palpable and warm.  Skin: No rashes,no icterus. MSK: Normal muscle bulk,tone, power   Medications reviewed:  Scheduled Meds:  ALPRAZolam  2 mg Oral QHS   arformoterol  15 mcg Nebulization BID   azithromycin  500 mg Oral Daily   benazepril  40 mg Oral Daily   budesonide (PULMICORT) nebulizer solution  0.25 mg Nebulization BID   cetirizine  10 mg Oral QPM   cloNIDine  0.2 mg Oral Daily   escitalopram  20 mg Oral Daily   estradiol  2 mg Oral Daily   furosemide  40 mg Oral Daily   methylPREDNISolone (SOLU-MEDROL) injection  40 mg Intravenous Q24H   oxyCODONE  20 mg Oral 5 X Daily   pantoprazole  40 mg Oral BID AC   revefenacin  175 mcg Nebulization Daily   traZODone  100 mg Oral QHS   Continuous Infusions:  cefTRIAXone (ROCEPHIN)  IV 2 g (10/27/23 0546)    Diet Order             Diet heart healthy/carb modified Room service appropriate? Yes; Fluid consistency: Thin;  Fluid restriction: 1200 mL Fluid  Diet effective now                  Intake/Output Summary (Last 24 hours) at 10/27/2023 1132 Last data filed at 10/27/2023 0400 Gross per 24 hour  Intake 354.38 ml  Output --  Net 354.38 ml   Net IO Since Admission: 1,424.38 mL [10/27/23 1132]  Wt Readings from Last 3 Encounters:  09/11/23 103.4 kg  06/29/23 98.1 kg  02/03/23 96.6 kg     Unresulted Labs (From admission, onward)     Start     Ordered   10/27/23 0500  CBC  Daily,   R      10/26/23 0905   10/27/23 0500  Comprehensive metabolic panel  Daily,   R      10/26/23 0905          Data Reviewed: I have personally reviewed following labs and imaging studies CBC: Recent Labs  Lab 10/25/23 0454 10/26/23 0503 10/27/23 0531  WBC 3.7* 2.6* 5.2  NEUTROABS 2.5  --   --   HGB 10.8* 10.8*  9.7*  HCT 36.1 36.8 33.9*  MCV 85.3 86.6 87.4  PLT 125* 128* 104*   Basic Metabolic Panel:  Recent Labs  Lab 10/25/23 0451 10/25/23 0454 10/26/23 0503 10/27/23 0531  NA  --  140 137 136  K  --  3.0* 3.7 3.5  CL  --  98 95* 94*  CO2  --  33* 35* 36*  GLUCOSE  --  87 147* 128*  BUN  --  5* 6* 11  CREATININE  --  0.69 0.78 0.86  CALCIUM  --  7.8* 8.0* 7.8*  MG 1.9  --   --   --   PHOS 2.1*  --   --   --    GFR: CrCl cannot be calculated (Unknown ideal weight.). Liver Function Tests:  Recent Labs  Lab 10/25/23 0454 10/26/23 0503 10/27/23 0531  AST 29 32 28  ALT 12 14 13   ALKPHOS 62 64 59  BILITOT 0.9 0.8 0.7  PROT 7.6 7.9 7.1  ALBUMIN 3.0* 3.2* 2.8*   Recent Labs  Lab 10/25/23 0454  LIPASE 33   No results for input(s): "AMMONIA" in the last 168 hours. Coagulation Profile:  Recent Labs  Lab 10/25/23 0454  INR 1.2   No results for input(s): "PROBNP" in the last 168 hours.  Recent Labs    10/26/23 0503  HGBA1C 4.9   Recent Labs  Lab 10/26/23 1117 10/26/23 1642 10/26/23 2014 10/27/23 0740 10/27/23 1124  GLUCAP 141* 151* 140* 119* 221*   No results for  input(s): "CHOL", "HDL", "LDLCALC", "TRIG", "CHOLHDL", "LDLDIRECT" in the last 72 hours. No results for input(s): "TSH", "T4TOTAL", "FREET4", "T3FREE", "THYROIDAB" in the last 72 hours. Sepsis Labs: Recent Labs  Lab 10/25/23 0451 10/25/23 0507 10/26/23 0503  PROCALCITON 3.50  --  1.99  LATICACIDVEN  --  0.7  --    Recent Results (from the past 240 hours)  Resp panel by RT-PCR (RSV, Flu A&B, Covid) Anterior Nasal Swab     Status: Abnormal   Collection Time: 10/25/23  4:13 AM   Specimen: Anterior Nasal Swab  Result Value Ref Range Status   SARS Coronavirus 2 by RT PCR POSITIVE (A) NEGATIVE Final    Comment: (NOTE) SARS-CoV-2 target nucleic acids are DETECTED.  The SARS-CoV-2 RNA is generally detectable in upper respiratory specimens during the acute phase of infection. Positive results are indicative of the presence of the identified virus, but do not rule out bacterial infection or co-infection with other pathogens not detected by the test. Clinical correlation with patient history and other diagnostic information is necessary to determine patient infection status. The expected result is Negative.  Fact Sheet for Patients: BloggerCourse.com  Fact Sheet for Healthcare Providers: SeriousBroker.it  This test is not yet approved or cleared by the [place] FDA and  has been authorized for detection and/or diagnosis of SARS-CoV-2 by FDA under an Emergency Use Authorization (EUA).  This EUA will remain in effect (meaning this test can be used) for the duration of  the COVID-19 declaration under Section 564(b)(1) of the A ct, 21 U.S.C. section 360bbb-3(b)(1), unless the authorization is terminated or revoked sooner.     Influenza A by PCR NEGATIVE NEGATIVE Final   Influenza B by PCR NEGATIVE NEGATIVE Final    Comment: (NOTE) The Xpert Xpress SARS-CoV-2/FLU/RSV plus assay is intended as an aid in the diagnosis of influenza  from Nasopharyngeal swab specimens and should not be used as a sole basis for treatment. Nasal washings and aspirates  are unacceptable for Xpert Xpress SARS-CoV-2/FLU/RSV testing.  Fact Sheet for Patients: BloggerCourse.com  Fact Sheet for Healthcare Providers: SeriousBroker.it  This test is not yet approved or cleared by the [place] FDA and has been authorized for detection and/or diagnosis of SARS-CoV-2 by FDA under an Emergency Use Authorization (EUA). This EUA will remain in effect (meaning this test can be used) for the duration of the COVID-19 declaration under Section 564(b)(1) of the Act, 21 U.S.C. section 360bbb-3(b)(1), unless the authorization is terminated or revoked.     Resp Syncytial Virus by PCR NEGATIVE NEGATIVE Final    Comment: (NOTE) Fact Sheet for Patients: BloggerCourse.com  Fact Sheet for Healthcare Providers: SeriousBroker.it  This test is not yet approved or cleared by the [place] FDA and has been authorized for detection and/or diagnosis of SARS-CoV-2 by FDA under an Emergency Use Authorization (EUA). This EUA will remain in effect (meaning this test can be used) for the duration of the COVID-19 declaration under Section 564(b)(1) of the Act, 21 U.S.C. section 360bbb-3(b)(1), unless the authorization is terminated or revoked.  Performed at [facility], [address], [place], [place] [address]   Blood Culture (routine x 2)     Status: None (Preliminary result)   Collection Time: 10/25/23  4:54 AM   Specimen: BLOOD  Result Value Ref Range Status   Specimen Description   Final    BLOOD BLOOD RIGHT ARM Performed at [facility], [address], [place], [place] [address]    Special Requests   Final    Blood Culture adequate volume BOTTLES DRAWN AEROBIC AND ANAEROBIC Performed at [facility], [address] [place], [place] [address]    Culture   Final    NO GROWTH 2 DAYS Performed at [facility], [address] [place], [place] [address]    Report Status PENDING  Incomplete  Blood Culture (routine x 2)     Status: None (Preliminary result)   Collection Time: 10/25/23  4:54 AM   Specimen: BLOOD  Result Value Ref Range Status   Specimen Description   Final    BLOOD LEFT ANTECUBITAL Performed at [facility], [address], [place], [place] [address]    Special Requests   Final    Blood Culture adequate volume BOTTLES DRAWN AEROBIC AND ANAEROBIC Performed at [facility], [address] [place], [place] [address]    Culture   Final    NO GROWTH 2 DAYS Performed at [facility], [address], [place], [place] [address]    Report Status PENDING  Incomplete  Body fluid culture w Gram Stain     Status: None (Preliminary result)   Collection Time: 10/25/23  8:19 AM   Specimen: Peritoneal Cavity; Peritoneal Fluid  Result Value Ref Range Status   Specimen Description   Final    PERITONEAL CAVITY Performed at [facility], [address], [place], [place] [address]    Special Requests   Final    NONE Performed at [facility], [address] [place], [place] [address]    Gram Stain NO WBC SEEN NO ORGANISMS SEEN   Final   Culture   Final    NO GROWTH 1 DAY Performed at [facility], [address] [place], [place] [address]    Report Status PENDING  Incomplete    Antimicrobials/Microbiology: Anti-infectives (From admission, onward)    Start  Dose/Rate Route Frequency Ordered Stop   10/26/23 1130  azithromycin (ZITHROMAX) tablet 500 mg        500 mg Oral Daily 10/26/23 1041 10/30/23 0959   10/26/23 1000  azithromycin (ZITHROMAX) 500 mg in sodium chloride 0.9 % 250 mL IVPB  Status:  Discontinued        500 mg 250 mL/hr over 60 Minutes  Intravenous Every 24 hours 10/25/23 0957 10/26/23 1041   10/26/23 0600  cefTRIAXone (ROCEPHIN) 2 g in sodium chloride 0.9 % 100 mL IVPB        2 g 200 mL/hr over 30 Minutes Intravenous Every 24 hours 10/25/23 0957 10/30/23 0559   10/25/23 0715  azithromycin (ZITHROMAX) 500 mg in sodium chloride 0.9 % 250 mL IVPB        500 mg 250 mL/hr over 60 Minutes Intravenous  Once 10/25/23 0712 10/25/23 1225   10/25/23 0545  cefTRIAXone (ROCEPHIN) 2 g in sodium chloride 0.9 % 100 mL IVPB        2 g 200 mL/hr over 30 Minutes Intravenous Once 10/25/23 0532 10/25/23 0713         Component Value Date/Time   SDES  10/25/2023 0819    PERITONEAL CAVITY Performed at [facility], [address] [place], [place] [address]    SPECREQUEST  10/25/2023 1914    NONE Performed at [facility], [address], [place], [place] [address]    CULT  10/25/2023 [phone]    NO GROWTH 1 DAY Performed at [facility], [address], [place], [place] [address]    REPTSTATUS PENDING 10/25/2023 8469  Radiology Studies: US Paracentesis Result Date: 10/26/2023 INDICATION: Patient with a history of cirrhosis presents today with ascites. Interventional radiology asked to perform a therapeutic paracentesis. EXAM: ULTRASOUND GUIDED PARACENTESIS MEDICATIONS: 1% lidocaine 10 mL COMPLICATIONS: None immediate. PROCEDURE: Informed written consent was obtained from the patient after a discussion of the risks, benefits and alternatives to treatment. A timeout was performed prior to the initiation of the procedure. Initial ultrasound scanning demonstrates a large amount of ascites within the left lower abdominal quadrant. The left lower abdomen was prepped and draped in the usual sterile fashion. 1% lidocaine was used for local anesthesia. Following this, a 19 gauge, 7-cm, Yueh catheter was introduced. An ultrasound image was saved for documentation purposes. The paracentesis was performed. The  catheter was removed and a dressing was applied. The patient tolerated the procedure well without immediate post procedural complication. FINDINGS: A total of approximately 2.2 L of blood-tinged fluid was removed. IMPRESSION: Successful ultrasound-guided paracentesis yielding 2.2 L liters of peritoneal fluid. Procedure performed by Loyce Dys, PA PLAN: If the patient eventually requires >/=2 paracenteses in a 30 day period, candidacy for formal evaluation by the [facility] will be assessed. Electronically Signed   By: Corlis Leak M.D.   On: 10/26/2023 12:12   LOS: 2 days  Total time spent in review of labs and imaging, patient evaluation, formulation of plan, documentation and communication with family: 35 minutes  Lanae Boast, MD  [facility]  10/27/2023, 11:32 AM

## 2023-10-27 NOTE — TOC Initial Note (Signed)
Transition of Care [facility]) - Initial/Assessment Note    Patient Details  Name: [name] MRN: [id number] Date of Birth: Apr 13, 1961  Transition of Care [facility] CM/SW Contact:    Lanier Clam, RN Phone Number: 10/27/2023, 2:22 PM  Clinical Narrative: d/c plan home. On 02-will monitor if needed since patient has used home 02 in past but it has been d/c-no set up @ home current.ly. Has own transport home.                  Expected Discharge Plan: Home/Self Care Barriers to Discharge: Continued Medical Work up   Patient Goals and CMS Choice Patient states their goals for this hospitalization and ongoing recovery are:: Home CMS Medicare.gov Compare Post Acute Care list provided to:: Patient Choice offered to / list presented to : Patient [place] ownership interest in [facility].provided to:: Patient    Expected Discharge Plan and Services   Discharge Planning Services: CM Consult   Living arrangements for the past 2 months: Single Family Home                                      Prior Living Arrangements/Services Living arrangements for the past 2 months: Single Family Home Lives with:: Spouse   Do you feel safe going back to the place where you live?: Yes          Current home services: DME (cane,rw)    Activities of Daily Living   ADL Screening (condition at time of admission) Independently performs ADLs?: No Does the patient have a NEW difficulty with bathing/dressing/toileting/self-feeding that is expected to last >3 days?: No Does the patient have a NEW difficulty with getting in/out of bed, walking, or climbing stairs that is expected to last >3 days?: No Does the patient have a NEW difficulty with communication that is expected to last >3 days?: No Is the patient deaf or have difficulty hearing?: No Does the patient have difficulty seeing, even when wearing glasses/contacts?: No Does the patient have difficulty concentrating, remembering, or  making decisions?: No  Permission Sought/Granted Permission sought to share information with : Case Manager                Emotional Assessment              Admission diagnosis:  Cirrhosis of liver with ascites, unspecified hepatic cirrhosis type ([name]) [K74.60, R18.8] Pneumonia due to infectious organism, unspecified laterality, unspecified part of lung [J18.9] COVID [U07.1] Pneumonia due to COVID-19 virus [U07.1, J12.82] Patient Active Problem List   Diagnosis Date Noted   Pancytopenia ([name]) 10/25/2023   Pneumonia due to COVID-19 virus 10/25/2023   Esophageal stricture 10/25/2023   Gastro-esophageal reflux disease without esophagitis 10/25/2023   Pure hypercholesterolemia 10/25/2023   Essential hypertension 10/25/2023   Acute on chronic hypoxic respiratory failure ([name]) 12/04/2022   Acute on chronic respiratory failure with hypoxia and hypercapnia ([name]) 11/05/2022   SBP (spontaneous bacterial peritonitis) ([name]) 08/13/2022   Chronic respiratory failure with hypoxia ([name]) 08/12/2022   Sepsis ([name]) 08/11/2022   Acute CHF (congestive heart failure) ([name]) 05/30/2022   Central chest pain 05/30/2022   Sepsis, unspecified organism ([name]) 03/27/2021   Nausea & vomiting 03/27/2021   AKI (acute kidney injury) ([name]) 03/27/2021   Hypotension 03/27/2021   Colitis 03/26/2021   Hyponatremia 02/28/2020   Peripheral edema 02/28/2020   Pain in left knee 09/12/2019  Cirrhosis of liver with ascites ([name]) 05/01/2019   Ascites 05/01/2019   NAFLD (nonalcoholic fatty liver disease) 16/06/9603   Chronic prescription benzodiazepine use 05/01/2019   Chronic, continuous use of opioids 05/01/2019   Chronic abdominal pain 01/12/2019   Hyperlipidemia associated with type 2 diabetes mellitus ([name]) 01/11/2019   Type 2 diabetes mellitus with obesity ([name]) 01/11/2019   Depression with anxiety 01/11/2019   OSA (obstructive sleep apnea) 01/11/2019   Hypomagnesemia    Hypophosphatemia     Degeneration of lumbar intervertebral disc 08/26/2018   Lumbar spondylosis 07/29/2018   Lumbar radiculopathy 10/03/2017   Chronic low back pain with left-sided sciatica 10/15/2015   Migraine headache 12/09/2014   Constipation by delayed colonic transit 12/09/2014   History of prosthetic unicompartmental arthroplasty of left knee 03/24/2013   Morbid obesity ([name]) 03/09/2013   Chronic pain    Tobacco use    CAD (coronary artery disease)    Hypertension associated with diabetes ([name])    COPD (chronic obstructive pulmonary disease) ([name])    PCP:  Noberto Retort, MD Pharmacy:   [facility] [phone] - [place], [place] - [address] AT [facility] OF [address] & [address] [place] [address] Phone: [phone] Fax: [phone]     Social Drivers of Health (SDOH) Social History: SDOH Screenings   Food Insecurity: No Food Insecurity (10/26/2023)  Housing: Low Risk  (10/26/2023)  Transportation Needs: Patient Declined (10/25/2023)  Utilities: Not At Risk (10/25/2023)  Social Connections: Unknown (01/27/2022)   Received from [facility]  Tobacco Use: High Risk (10/25/2023)   SDOH Interventions:     Readmission Risk Interventions     No data to display

## 2023-10-28 DIAGNOSIS — J1282 Pneumonia due to coronavirus disease 2019: Secondary | ICD-10-CM | POA: Diagnosis not present

## 2023-10-28 DIAGNOSIS — U071 COVID-19: Secondary | ICD-10-CM | POA: Diagnosis not present

## 2023-10-28 LAB — CBC
HCT: 37.1 % (ref 36.0–46.0)
Hemoglobin: 11 g/dL — ABNORMAL LOW (ref 12.0–15.0)
MCH: 25.9 pg — ABNORMAL LOW (ref 26.0–34.0)
MCHC: 29.6 g/dL — ABNORMAL LOW (ref 30.0–36.0)
MCV: 87.3 fL (ref 80.0–100.0)
Platelets: 121 10*3/uL — ABNORMAL LOW (ref 150–400)
RBC: 4.25 MIL/uL (ref 3.87–5.11)
RDW: 15.6 % — ABNORMAL HIGH (ref 11.5–15.5)
WBC: 5.2 10*3/uL (ref 4.0–10.5)
nRBC: 0 % (ref 0.0–0.2)

## 2023-10-28 LAB — BODY FLUID CULTURE W GRAM STAIN: Gram Stain: NONE SEEN

## 2023-10-28 LAB — COMPREHENSIVE METABOLIC PANEL
ALT: 16 U/L (ref 0–44)
AST: 32 U/L (ref 15–41)
Albumin: 2.9 g/dL — ABNORMAL LOW (ref 3.5–5.0)
Alkaline Phosphatase: 64 U/L (ref 38–126)
Anion gap: 8 (ref 5–15)
BUN: 11 mg/dL (ref 8–23)
CO2: 37 mmol/L — ABNORMAL HIGH (ref 22–32)
Calcium: 7.7 mg/dL — ABNORMAL LOW (ref 8.9–10.3)
Chloride: 90 mmol/L — ABNORMAL LOW (ref 98–111)
Creatinine, Ser: 0.69 mg/dL (ref 0.44–1.00)
GFR, Estimated: >60 mL/min (ref >60)
Glucose, Bld: 74 mg/dL (ref 70–99)
Potassium: 3 mmol/L — ABNORMAL LOW (ref 3.5–5.1)
Sodium: 135 mmol/L (ref 135–145)
Total Bilirubin: 0.8 mg/dL (ref 0.0–1.2)
Total Protein: 7.7 g/dL (ref 6.5–8.1)

## 2023-10-28 LAB — GLUCOSE, CAPILLARY
Glucose-Capillary: 73 mg/dL (ref 70–99)
Glucose-Capillary: 134 mg/dL — ABNORMAL HIGH (ref 70–99)
Glucose-Capillary: 234 mg/dL — ABNORMAL HIGH (ref 70–99)
Glucose-Capillary: 152 mg/dL — ABNORMAL HIGH (ref 70–99)

## 2023-10-28 MED ORDER — METHYLPREDNISOLONE SODIUM SUCC 40 MG IJ SOLR
40.0000 mg | Freq: Two times a day (BID) | INTRAMUSCULAR | Status: DC
  Administered 2023-10-28 – 2023-10-31 (×6): 40 mg via INTRAVENOUS
  Filled 2023-10-28 (×6): qty 1

## 2023-10-28 MED ORDER — GUAIFENESIN-DM 100-10 MG/5ML PO SYRP
10.0000 mL | ORAL_SOLUTION | ORAL | Status: DC | PRN
  Administered 2023-10-29 – 2023-10-31 (×7): 10 mL via ORAL
  Filled 2023-10-28 (×7): qty 10

## 2023-10-28 NOTE — Plan of Care (Signed)
  Problem: Education: Goal: Knowledge of risk factors and measures for prevention of condition will improve Outcome: Progressing   Problem: Coping: Goal: Ability to adjust to condition or change in health will improve Outcome: Progressing   Problem: Metabolic: Goal: Ability to maintain appropriate glucose levels will improve Outcome: Progressing   Problem: Respiratory: Goal: Ability to maintain a clear airway will improve Outcome: Progressing

## 2023-10-28 NOTE — Progress Notes (Signed)
PROGRESS NOTE KESLEY MULLENS  ZHY:[id number] DOB: 02/02/1961 DOA: 10/25/2023 PCP: Noberto Retort, MD  Brief Narrative/Hospital Course: 63 YOF W/ history of chronic abdominal pain, chronic respiratory failure with hypoxia, anxiety, depression, osteoarthritis, nonalcoholic liver cirrhosis last paracentesis ON 09/17/23 with 3.1 L of blood-tinged fluid who presented to the ED w/ complaints of exacerbation of chronic abdominal pain associated with fever, chills, fatigue, malaise, decreased appetite, nausea and vomiting along with dyspnea,wheezing and having an occasional productive cough at times.  Symptom onset Wednesday morning 10/21/2023 with fever  In the ED: Vitals stable afebrile BP on higher side, labs showed hypokalemia 3.0 normal LFTs, renal function and chronic anemia with hemoglobin 10.8 g and platelet 1 25K, COVID-19 with positive UA unremarkable. CXR>>patchy, symmetric perihilar lung opacity more resembling multilobar infection then asymmetric pulmonary edema.  No consolidation or pleural effusion. CT abdomen/pelvis with contrast>>groundglass opacity in the lung bases bilateral suspicious for pneumonia.  Mildly enlarged subcarinal lymph node is partially visualized measuring up to 1.5 cm most likely inflammatory, however incompletely evaluated on this examination.  Progressive cirrhosis with moderate abdominal ascites. Patient was given electrolyte replacement antiemetics antibiotics and admitted Overall patient remains hemodynamically stable afebrile labs ok.  She was complaining of left buttock bone underwent CT pelvis along with duplex lower extremity no acute finding     Subjective: Patient seen and examined this morning She complains of ongoing cough and wheezing  On room air 89% does well on 2 L home setting CRP 3.8 otherwise stable labs mild hypokalemia, also thrombocytopenia  Assessment and Plan: Principal Problem:   Pneumonia due to COVID-19 virus Active Problems:   Cirrhosis of  liver with ascites ([name])   Chronic respiratory failure with hypoxia ([name])   Type 2 diabetes mellitus with obesity ([name])   CAD (coronary artery disease)   Hypophosphatemia   Hyperlipidemia associated with type 2 diabetes mellitus ([name])   Depression with anxiety   OSA (obstructive sleep apnea)   NAFLD (nonalcoholic fatty liver disease)   Pancytopenia ([name])   Essential hypertension   COVID-19 virus infection CAP Chronic hypoxic respiratory failure Acute COPD exacerbation: Patient tested positive for COVID 19 having respiratory symptoms and imaging shows groundglass opacity in the lung bases bilaterally.  Onset illness on 10/21/23>Procalcitonin elevated 1.9, admitted on empiric antibiotics for CAP, steroid, Cont bronchodilators, Solu-Medrol- increased to q12hr Cont home 2 L [place]. Cont Pulmicort, Brovana and Yupelri nebulizer.  IS Recent Labs  Lab 10/25/23 0451 10/25/23 0454 10/25/23 0507 10/26/23 0503 10/27/23 0531 10/28/23 0534  WBC  --  3.7*  --  2.6* 5.2 5.2  LATICACIDVEN  --   --  0.7  --   --   --   PROCALCITON 3.50  --   --  1.99  --   --     Decompensated liver cirrhosis with ascites NAFLD: Underwent paracentesis with 2.2 fluid removal 2/9.continue oral Lasix.  Pancytopenia:  In the setting of liver cirrhosis/viral illness. Monitor   Type 2 diabetes mellitus with obesity: A1c stable blood sugar borderline controlled due to steroids.  Continue current insulin regimen Recent Labs  Lab 10/26/23 0503 10/26/23 0847 10/27/23 0740 10/27/23 1124 10/27/23 1531 10/27/23 2025 10/28/23 0743  GLUCAP  --    < > 119* 221* 235* 152* 73  HGBA1C 4.9  --   --   --   --   --   --    < > = values in this interval not displayed.    Essential hypertension BP well-controlled continue  home Lasix, Benzapril and clonidine    CAD HLD No chest pain.  Statin on hold.     Hypophosphatemia Hypokalemia: Replace electrolytes.  Depression with anxiety Mood stable on Lexapro 20 mg p.o.  daily.   OSA Not using CPAP at home at baseline.  Chronic opiate/chronic pain: Left buttock pain: Continue home oxy.  Negative duplex of the leg, CT pelvis on 2/11  DVT prophylaxis: SCDs Start: 10/25/23 4132 Code Status:   Code Status: Full Code Family Communication: plan of care discussed with patient at bedside. Patient status is: Remains hospitalized because of severity of illness Level of care: Progressive   Dispo: The patient is from: Lives with husband            Anticipated disposition: TBD Objective: Vitals last 24 hrs: Vitals:   10/27/23 1818 10/27/23 2022 10/28/23 0432 10/28/23 0929  BP:  133/65 (!) 155/81   Pulse:  77 77   Resp:      Temp:  99.2 F (37.3 C) 99 F (37.2 C)   TempSrc:  Oral Oral   SpO2: 94% 96% (!) 86% (S) (!) 89%   Weight change:   Physical Examination: General exam: alert awake, oriented  HEENT:Oral mucosa moist, Ear/Nose WNL grossly Respiratory system: Bilaterally diminished breath sounds with wheezing  Cardiovascular system: S1 & S2 +, No JVD. Gastrointestinal system: Abdomen soft,NT,ND, BS+ Nervous System: Alert, awake, moving all extremities,and following commands. Extremities: LE edema neg,distal peripheral pulses palpable and warm.  Skin: No rashes,no icterus. MSK: Normal muscle bulk,tone, power   Medications reviewed:  Scheduled Meds:  ALPRAZolam  2 mg Oral QHS   arformoterol  15 mcg Nebulization BID   azithromycin  500 mg Oral Daily   benazepril  40 mg Oral Daily   budesonide (PULMICORT) nebulizer solution  0.25 mg Nebulization BID   cetirizine  10 mg Oral QPM   cloNIDine  0.2 mg Oral Daily   escitalopram  20 mg Oral Daily   estradiol  2 mg Oral Daily   furosemide  40 mg Oral Daily   methylPREDNISolone (SOLU-MEDROL) injection  40 mg Intravenous Q24H   oxyCODONE  20 mg Oral 5 X Daily   pantoprazole  40 mg Oral BID AC   revefenacin  175 mcg Nebulization Daily   traZODone  100 mg Oral QHS   Continuous Infusions:   cefTRIAXone (ROCEPHIN)  IV 2 g (10/28/23 0601)    Diet Order             Diet heart healthy/carb modified Room service appropriate? Yes; Fluid consistency: Thin  Diet effective now                  Intake/Output Summary (Last 24 hours) at 10/28/2023 1007 Last data filed at 10/28/2023 0800 Gross per 24 hour  Intake 892 ml  Output --  Net 892 ml   Net IO Since Admission: 2,556.38 mL [10/28/23 1007]  Wt Readings from Last 3 Encounters:  09/11/23 103.4 kg  06/29/23 98.1 kg  02/03/23 96.6 kg     Unresulted Labs (From admission, onward)     Start     Ordered   10/27/23 0500  CBC  Daily,   R      10/26/23 0905   10/27/23 0500  Comprehensive metabolic panel  Daily,   R      10/26/23 4401          Data Reviewed: I have personally reviewed following labs and imaging studies CBC: Recent Labs  Lab  10/25/23 0454 10/26/23 0503 10/27/23 0531 10/28/23 0534  WBC 3.7* 2.6* 5.2 5.2  NEUTROABS 2.5  --   --   --   HGB 10.8* 10.8* 9.7* 11.0*  HCT 36.1 36.8 33.9* 37.1  MCV 85.3 86.6 87.4 87.3  PLT 125* 128* 104* 121*   Basic Metabolic Panel:  Recent Labs  Lab 10/25/23 0451 10/25/23 0454 10/26/23 0503 10/27/23 0531 10/28/23 0534  NA  --  140 137 136 135  K  --  3.0* 3.7 3.5 3.0*  CL  --  98 95* 94* 90*  CO2  --  33* 35* 36* 37*  GLUCOSE  --  87 147* 128* 74  BUN  --  5* 6* 11 11  CREATININE  --  0.69 0.78 0.86 0.69  CALCIUM  --  7.8* 8.0* 7.8* 7.7*  MG 1.9  --   --   --   --   PHOS 2.1*  --   --   --   --    GFR: CrCl cannot be calculated (Unknown ideal weight.). Liver Function Tests:  Recent Labs  Lab 10/25/23 0454 10/26/23 0503 10/27/23 0531 10/28/23 0534  AST 29 32 28 32  ALT 12 14 13 16   ALKPHOS 62 64 59 64  BILITOT 0.9 0.8 0.7 0.8  PROT 7.6 7.9 7.1 7.7  ALBUMIN 3.0* 3.2* 2.8* 2.9*   Recent Labs  Lab 10/25/23 0454  LIPASE 33   No results for input(s): "AMMONIA" in the last 168 hours. Coagulation Profile:  Recent Labs  Lab 10/25/23 0454  INR  1.2   No results for input(s): "PROBNP" in the last 168 hours.  Recent Labs    10/26/23 0503  HGBA1C 4.9   Recent Labs  Lab 10/27/23 0740 10/27/23 1124 10/27/23 1531 10/27/23 2025 10/28/23 0743  GLUCAP 119* 221* 235* 152* 73   No results for input(s): "CHOL", "HDL", "LDLCALC", "TRIG", "CHOLHDL", "LDLDIRECT" in the last 72 hours. No results for input(s): "TSH", "T4TOTAL", "FREET4", "T3FREE", "THYROIDAB" in the last 72 hours. Sepsis Labs: Recent Labs  Lab 10/25/23 0451 10/25/23 0507 10/26/23 0503  PROCALCITON 3.50  --  1.99  LATICACIDVEN  --  0.7  --    Recent Results (from the past 240 hours)  Resp panel by RT-PCR (RSV, Flu A&B, Covid) Anterior Nasal Swab     Status: Abnormal   Collection Time: 10/25/23  4:13 AM   Specimen: Anterior Nasal Swab  Result Value Ref Range Status   SARS Coronavirus 2 by RT PCR POSITIVE (A) NEGATIVE Final    Comment: (NOTE) SARS-CoV-2 target nucleic acids are DETECTED.  The SARS-CoV-2 RNA is generally detectable in upper respiratory specimens during the acute phase of infection. Positive results are indicative of the presence of the identified virus, but do not rule out bacterial infection or co-infection with other pathogens not detected by the test. Clinical correlation with patient history and other diagnostic information is necessary to determine patient infection status. The expected result is Negative.  Fact Sheet for Patients: BloggerCourse.com  Fact Sheet for Healthcare Providers: SeriousBroker.it  This test is not yet approved or cleared by the [place] FDA and  has been authorized for detection and/or diagnosis of SARS-CoV-2 by FDA under an Emergency Use Authorization (EUA).  This EUA will remain in effect (meaning this test can be used) for the duration of  the COVID-19 declaration under Section 564(b)(1) of the A ct, 21 U.S.C. section 360bbb-3(b)(1), unless the  authorization is terminated or revoked sooner.  Influenza A by PCR NEGATIVE NEGATIVE Final   Influenza B by PCR NEGATIVE NEGATIVE Final    Comment: (NOTE) The Xpert Xpress SARS-CoV-2/FLU/RSV plus assay is intended as an aid in the diagnosis of influenza from Nasopharyngeal swab specimens and should not be used as a sole basis for treatment. Nasal washings and aspirates are unacceptable for Xpert Xpress SARS-CoV-2/FLU/RSV testing.  Fact Sheet for Patients: BloggerCourse.com  Fact Sheet for Healthcare Providers: SeriousBroker.it  This test is not yet approved or cleared by the [place] FDA and has been authorized for detection and/or diagnosis of SARS-CoV-2 by FDA under an Emergency Use Authorization (EUA). This EUA will remain in effect (meaning this test can be used) for the duration of the COVID-19 declaration under Section 564(b)(1) of the Act, 21 U.S.C. section 360bbb-3(b)(1), unless the authorization is terminated or revoked.     Resp Syncytial Virus by PCR NEGATIVE NEGATIVE Final    Comment: (NOTE) Fact Sheet for Patients: BloggerCourse.com  Fact Sheet for Healthcare Providers: SeriousBroker.it  This test is not yet approved or cleared by the [place] FDA and has been authorized for detection and/or diagnosis of SARS-CoV-2 by FDA under an Emergency Use Authorization (EUA). This EUA will remain in effect (meaning this test can be used) for the duration of the COVID-19 declaration under Section 564(b)(1) of the Act, 21 U.S.C. section 360bbb-3(b)(1), unless the authorization is terminated or revoked.  Performed at [facility], [address], [place], [place] [address]   Blood Culture (routine x 2)     Status: None (Preliminary result)   Collection Time: 10/25/23  4:54 AM   Specimen: BLOOD  Result Value Ref Range Status   Specimen  Description   Final    BLOOD BLOOD RIGHT ARM Performed at [facility], [address] [place], [place] [address]    Special Requests   Final    Blood Culture adequate volume BOTTLES DRAWN AEROBIC AND ANAEROBIC Performed at [facility], [address] [place], [place] [address]    Culture   Final    NO GROWTH 3 DAYS Performed at [facility], [address] [place], [place] [address]    Report Status PENDING  Incomplete  Blood Culture (routine x 2)     Status: None (Preliminary result)   Collection Time: 10/25/23  4:54 AM   Specimen: BLOOD  Result Value Ref Range Status   Specimen Description   Final    BLOOD LEFT ANTECUBITAL Performed at [facility], [address], [place], [place] [address]    Special Requests   Final    Blood Culture adequate volume BOTTLES DRAWN AEROBIC AND ANAEROBIC Performed at [facility], [address], [place], [place] [address]    Culture   Final    NO GROWTH 3 DAYS Performed at [facility], [address], [place], [place] [address]    Report Status PENDING  Incomplete  Body fluid culture w Gram Stain     Status: None (Preliminary result)   Collection Time: 10/25/23  8:19 AM   Specimen: Peritoneal Cavity; Peritoneal Fluid  Result Value Ref Range Status   Specimen Description   Final    PERITONEAL CAVITY Performed at [facility], [address], [place], [place] [address]    Special Requests   Final    NONE Performed at [facility], [address] [place], [place] [address]    Gram  Stain NO WBC SEEN NO ORGANISMS SEEN   Final   Culture   Final    NO GROWTH 2 DAYS Performed at [facility], [address] [place], [place] [address]    Report Status PENDING  Incomplete    Antimicrobials/Microbiology: Anti-infectives (From admission, onward)    Start     Dose/Rate Route Frequency Ordered Stop    10/26/23 1130  azithromycin (ZITHROMAX) tablet 500 mg        500 mg Oral Daily 10/26/23 1041 10/30/23 0959   10/26/23 1000  azithromycin (ZITHROMAX) 500 mg in sodium chloride 0.9 % 250 mL IVPB  Status:  Discontinued        500 mg 250 mL/hr over 60 Minutes Intravenous Every 24 hours 10/25/23 0957 10/26/23 1041   10/26/23 0600  cefTRIAXone (ROCEPHIN) 2 g in sodium chloride 0.9 % 100 mL IVPB        2 g 200 mL/hr over 30 Minutes Intravenous Every 24 hours 10/25/23 0957 10/30/23 0559   10/25/23 0715  azithromycin (ZITHROMAX) 500 mg in sodium chloride 0.9 % 250 mL IVPB        500 mg 250 mL/hr over 60 Minutes Intravenous  Once 10/25/23 0712 10/25/23 1225   10/25/23 0545  cefTRIAXone (ROCEPHIN) 2 g in sodium chloride 0.9 % 100 mL IVPB        2 g 200 mL/hr over 30 Minutes Intravenous Once 10/25/23 0532 10/25/23 0713         Component Value Date/Time   SDES  10/25/2023 0819    PERITONEAL CAVITY Performed at [facility], [address], [place], [place] [address]    SPECREQUEST  10/25/2023 9562    NONE Performed at [facility], [address] [place], [place] [address]    CULT  10/25/2023 [phone]    NO GROWTH 2 DAYS Performed at [facility], [address], [place], [place] [address]    REPTSTATUS PENDING 10/25/2023 5284  Radiology Studies: CT PELVIS W CONTRAST Result Date: 10/27/2023 CLINICAL DATA:  Pelvic pain.  Stress fracture suspected. EXAM: CT PELVIS WITH CONTRAST TECHNIQUE: Multidetector CT imaging of the pelvis was performed using the standard protocol following the bolus administration of intravenous contrast. RADIATION DOSE REDUCTION: This exam was performed according to the departmental dose-optimization program which includes automated exposure control, adjustment of the mA and/or kV according to patient size and/or use of iterative reconstruction technique. CONTRAST:  OMNIPAQUE IOHEXOL 300 MG/ML  SOLN COMPARISON:  CT abdomen  pelvis dated 10/25/2023. FINDINGS: Evaluation of this exam is limited in the absence of intravenous contrast. Urinary Tract: The visualized ureters and urinary bladder appear unremarkable. Bowel:  Sigmoid diverticulosis.  No bowel dilatation in the pelvis. Vascular/Lymphatic: No pathologically enlarged lymph nodes. No significant vascular abnormality seen. Reproductive:  Hysterectomy.  No suspicious adnexal masses. Other:  Small ascites.  Subcutaneous edema. Musculoskeletal: There is no acute fracture or dislocation. The bones are osteopenic. Bilateral L5 pars defects with grade 1 anterolisthesis. There is disc desiccation and vacuum phenomena at L5-S1. IMPRESSION: 1. No acute fracture or dislocation. 2. Sigmoid diverticulosis. 3. Small ascites. 4. Bilateral L5 pars defects with grade 1 anterolisthesis. Electronically Signed   By: Elgie Collard M.D.   On: 10/27/2023 17:48   VAS [place] LOWER EXTREMITY VENOUS (DVT) Result Date: 10/27/2023  Lower Venous DVT Study Patient Name:  ADDILYNN MOWRER  Date of Exam:   10/27/2023 Medical Rec #: [id number]     Accession #:  [id number] Date of Birth: September 15, 1961     Patient Gender: F Patient Age:   57 years Exam Location:  [facility] Procedure:      VAS [place] LOWER EXTREMITY VENOUS (DVT) Referring Phys: Masa Lubin --------------------------------------------------------------------------------  Indications: Swelling.  Risk Factors: None identified. Limitations: Poor ultrasound/tissue interface. Comparison Study: No prior studies. Performing Technologist: Chanda Busing RVT  Examination Guidelines: A complete evaluation includes B-mode imaging, spectral Doppler, color Doppler, and power Doppler as needed of all accessible portions of each vessel. Bilateral testing is considered an integral part of a complete examination. Limited examinations for reoccurring indications may be performed as noted. The reflux portion of the exam is performed with the patient in reverse  Trendelenburg.  +---------+---------------+---------+-----------+----------+--------------+ RIGHT    CompressibilityPhasicitySpontaneityPropertiesThrombus Aging +---------+---------------+---------+-----------+----------+--------------+ CFV      Full           Yes      Yes                                 +---------+---------------+---------+-----------+----------+--------------+ SFJ      Full                                                        +---------+---------------+---------+-----------+----------+--------------+ FV Prox  Full                                                        +---------+---------------+---------+-----------+----------+--------------+ FV Mid   Full                                                        +---------+---------------+---------+-----------+----------+--------------+ FV DistalFull                                                        +---------+---------------+---------+-----------+----------+--------------+ PFV      Full                                                        +---------+---------------+---------+-----------+----------+--------------+ POP      Full           Yes      Yes                                 +---------+---------------+---------+-----------+----------+--------------+ PTV      Full                                                        +---------+---------------+---------+-----------+----------+--------------+  PERO     Full                                                        +---------+---------------+---------+-----------+----------+--------------+   +---------+---------------+---------+-----------+----------+--------------+ LEFT     CompressibilityPhasicitySpontaneityPropertiesThrombus Aging +---------+---------------+---------+-----------+----------+--------------+ CFV      Full           Yes      Yes                                  +---------+---------------+---------+-----------+----------+--------------+ SFJ      Full                                                        +---------+---------------+---------+-----------+----------+--------------+ FV Prox  Full                                                        +---------+---------------+---------+-----------+----------+--------------+ FV Mid   Full                                                        +---------+---------------+---------+-----------+----------+--------------+ FV DistalFull                                                        +---------+---------------+---------+-----------+----------+--------------+ PFV      Full                                                        +---------+---------------+---------+-----------+----------+--------------+ POP      Full           Yes      Yes                                 +---------+---------------+---------+-----------+----------+--------------+ PTV      Full                                                        +---------+---------------+---------+-----------+----------+--------------+ PERO     Full                                                        +---------+---------------+---------+-----------+----------+--------------+  Summary: RIGHT: - There is no evidence of deep vein thrombosis in the lower extremity.  - No cystic structure found in the popliteal fossa.  LEFT: - There is no evidence of deep vein thrombosis in the lower extremity.  - No cystic structure found in the popliteal fossa.  *See table(s) above for measurements and observations.    Preliminary    LOS: 3 days  Total time spent in review of labs and imaging, patient evaluation, formulation of plan, documentation and communication with family: 35 minutes  Lanae Boast, MD  [facility]  10/28/2023, 10:07 AM

## 2023-10-29 DIAGNOSIS — J1282 Pneumonia due to coronavirus disease 2019: Secondary | ICD-10-CM | POA: Diagnosis not present

## 2023-10-29 DIAGNOSIS — U071 COVID-19: Secondary | ICD-10-CM | POA: Diagnosis not present

## 2023-10-29 LAB — COMPREHENSIVE METABOLIC PANEL
ALT: 21 U/L (ref 0–44)
AST: 40 U/L (ref 15–41)
Albumin: 2.7 g/dL — ABNORMAL LOW (ref 3.5–5.0)
Alkaline Phosphatase: 64 U/L (ref 38–126)
Anion gap: 12 (ref 5–15)
BUN: 13 mg/dL (ref 8–23)
CO2: 35 mmol/L — ABNORMAL HIGH (ref 22–32)
Calcium: 7.7 mg/dL — ABNORMAL LOW (ref 8.9–10.3)
Chloride: 89 mmol/L — ABNORMAL LOW (ref 98–111)
Creatinine, Ser: 0.72 mg/dL (ref 0.44–1.00)
GFR, Estimated: >60 mL/min (ref >60)
Glucose, Bld: 244 mg/dL — ABNORMAL HIGH (ref 70–99)
Potassium: 3.3 mmol/L — ABNORMAL LOW (ref 3.5–5.1)
Sodium: 136 mmol/L (ref 135–145)
Total Bilirubin: 0.8 mg/dL (ref 0.0–1.2)
Total Protein: 6.9 g/dL (ref 6.5–8.1)

## 2023-10-29 LAB — CBC
HCT: 35.7 % — ABNORMAL LOW (ref 36.0–46.0)
Hemoglobin: 10.2 g/dL — ABNORMAL LOW (ref 12.0–15.0)
MCH: 24.9 pg — ABNORMAL LOW (ref 26.0–34.0)
MCHC: 28.6 g/dL — ABNORMAL LOW (ref 30.0–36.0)
MCV: 87.3 fL (ref 80.0–100.0)
Platelets: 123 10*3/uL — ABNORMAL LOW (ref 150–400)
RBC: 4.09 MIL/uL (ref 3.87–5.11)
RDW: 15.4 % (ref 11.5–15.5)
WBC: 3.2 10*3/uL — ABNORMAL LOW (ref 4.0–10.5)
nRBC: 0 % (ref 0.0–0.2)

## 2023-10-29 LAB — GLUCOSE, CAPILLARY
Glucose-Capillary: 191 mg/dL — ABNORMAL HIGH (ref 70–99)
Glucose-Capillary: 199 mg/dL — ABNORMAL HIGH (ref 70–99)
Glucose-Capillary: 212 mg/dL — ABNORMAL HIGH (ref 70–99)
Glucose-Capillary: 153 mg/dL — ABNORMAL HIGH (ref 70–99)

## 2023-10-29 LAB — C-REACTIVE PROTEIN: CRP: 5.7 mg/dL — ABNORMAL HIGH (ref <1.0)

## 2023-10-29 LAB — PROCALCITONIN: Procalcitonin: 0.2 ng/mL

## 2023-10-29 MED ORDER — POTASSIUM CHLORIDE CRYS ER 20 MEQ PO TBCR
40.0000 meq | EXTENDED_RELEASE_TABLET | Freq: Once | ORAL | Status: AC
  Administered 2023-10-29: 40 meq via ORAL
  Filled 2023-10-29: qty 2

## 2023-10-29 MED ORDER — INSULIN ASPART 100 UNIT/ML IJ SOLN
0.0000 [IU] | Freq: Three times a day (TID) | INTRAMUSCULAR | Status: DC
  Administered 2023-10-29: 2 [IU] via SUBCUTANEOUS
  Administered 2023-10-29: 3 [IU] via SUBCUTANEOUS
  Administered 2023-10-29: 2 [IU] via SUBCUTANEOUS
  Administered 2023-10-30: 1 [IU] via SUBCUTANEOUS
  Administered 2023-10-30: 3 [IU] via SUBCUTANEOUS
  Administered 2023-10-30: 2 [IU] via SUBCUTANEOUS
  Administered 2023-10-31: 3 [IU] via SUBCUTANEOUS

## 2023-10-29 NOTE — Progress Notes (Signed)
Pt is requesting dilaudid for pain specifically. Instructed pt she does not have any orders for dilaudid. Scheduled dose of oxycodone administered. Pt refused Tylenol. However, pt is very drowsy and unable to keep eyes open without stimulation. Pt will awaken, answer questions appropriately, then goes back to sleep as soon as she finishes speaking. Pt doe snot appear to be in severe pain; therefore, RN does not see the need to message MD at this time. Will continue to monitor pt and reach out to MD if needed.

## 2023-10-29 NOTE — Progress Notes (Signed)
Pt was found vaping in her room. Charge RN notified and removed the vape. She is aware her vape is labeled and locked at the nurse's station. Smoking cessation education given to pt.

## 2023-10-29 NOTE — Progress Notes (Signed)
PROGRESS NOTE [name]  MVH:[id number] DOB: 09/29/1960 DOA: 10/25/2023 PCP: Noberto Retort, MD  Brief Narrative/Hospital Course: 59 YOF W/ history of chronic abdominal pain, chronic respiratory failure with hypoxia, anxiety, depression, osteoarthritis, nonalcoholic liver cirrhosis last paracentesis on 09/17/23 with 3.1 L of blood-tinged fluid who presented to the ED w/ complaints of exacerbation of chronic abdominal pain associated with fever, chills, fatigue, malaise, decreased appetite, nausea and vomiting along with dyspnea,wheezing and having an occasional productive cough at times.  Symptoms have been going for several days. In the ED: Vitals stable afebrile BP on higher side, labs showed hypokalemia 3.0 normal LFTs, renal function and chronic anemia with hemoglobin 10.8 g and platelet 1 25K, COVID-19 with positive UA unremarkable. CXR>>patchy, symmetric perihilar lung opacity more resembling multilobar infection then asymmetric pulmonary edema.  No consolidation or pleural effusion. CT abdomen/pelvis with contrast>>groundglass opacity in the lung bases bilateral suspicious for pneumonia.  Mildly enlarged subcarinal lymph node is partially visualized measuring up to 1.5 cm most likely inflammatory, however incompletely evaluated on this examination.  Progressive cirrhosis with moderate abdominal ascites. Patient was given electrolyte replacement antiemetics antibiotics and admitted Overall patient remains hemodynamically stable afebrile labs ok.  She was complaining of left buttock bone underwent CT pelvis along with duplex lower extremity no acute finding     Subjective: Patient seen and examined Resting comfortably on 2 L nasal cannula Denies any shortness of breath, has mild cough Reports every time she eats she gets pain in her abdomen, liver cirrhosis Complains of leaking on the left paracentesis site Labs shows hyperglycemia mild hypokalemia leukopenia thrombocytopenia and anemia abt  the same  Assessment and Plan: Principal Problem:   Pneumonia due to COVID-19 virus Active Problems:   Cirrhosis of liver with ascites ([name])   Chronic respiratory failure with hypoxia ([name])   Type 2 diabetes mellitus with obesity ([name])   CAD (coronary artery disease)   Hypophosphatemia   Hyperlipidemia associated with type 2 diabetes mellitus ([name])   Depression with anxiety   OSA (obstructive sleep apnea)   NAFLD (nonalcoholic fatty liver disease)   Pancytopenia ([name])   Essential hypertension   COVID-19 virus infection CAP Acute on chronic hypoxic respiratory failure Acute COPD exacerbation: Patient tested positive for COVID 19 having respiratory symptoms for several days PTA.  imaging shows groundglass opacity in the lung bases, had elevated procalcitonin 1.9, admitted on empiric antibiotics for CAP, steroid.  Increase Solu-Medrol to twice daily 2/12 due to wheezing and has no longer wheezing now.  Doing well on 2l [place].  Patient reported she had oxygen in the past but no longer has it now. She may need home oxygen at this time.  Continue Pulmicort, Brovana and Yupelri nebulizer  IS. Obtain PT OT evaluation today Recent Labs  Lab 10/25/23 0451 10/25/23 0454 10/25/23 0507 10/26/23 0503 10/27/23 0531 10/28/23 0534 10/29/23 0516  WBC  --  3.7*  --  2.6* 5.2 5.2 3.2*  LATICACIDVEN  --   --  0.7  --   --   --   --   PROCALCITON 3.50  --   --  1.99  --   --   --     Decompensated liver cirrhosis with ascites NAFLD: Underwent paracentesis with 2.2 fluid removal 2/9.continue oral Lasix.  She was to continue on fluid unrestricted diet.  Pancytopenia:  In the setting of liver cirrhosis/viral illness.Overnight monitor counts-stable.  Type 2 diabetes mellitus with obesity: A1c stable cbg is borderline cont on ssi Recent  Labs  Lab 10/26/23 0503 10/26/23 0847 10/28/23 0743 10/28/23 1142 10/28/23 1634 10/28/23 2032 10/29/23 0744  GLUCAP  --    < > 73 134* 234* 152* 191*   HGBA1C 4.9  --   --   --   --   --   --    < > = values in this interval not displayed.    Essential hypertension BP well-controlled,continue home Lasix, Benzapril and clonidine.    CAD HLD No chest pain.Statin on hold.     Hypophosphatemia Hypokalemia: Replacing potassium this morning.Monitor   Depression with anxiety Mood stable on Lexapro 20 mg p.o. daily.   OSA Not using CPAP at home at baseline.  Chronic opiate/chronic pain: Left buttock pain Following pain management clinic: Continue home oxy.  Negative duplex of the leg, CT pelvis on 2/11.  Continue home oxycodone 20 mg, avoid further IV narcotics.  She reports she was due for his pain clinic visit 2/14.  DVT prophylaxis: SCDs Start: 10/25/23 0956 Code Status:   Code Status: Full Code Family Communication: plan of care discussed with patient at bedside. Patient status is: Remains hospitalized because of severity of illness Level of care: Progressive   Dispo: The patient is from: Lives with husband            Anticipated disposition: TBD. Will get PT OT evaluation today Objective: Vitals last 24 hrs: Vitals:   10/28/23 2030 10/28/23 2138 10/29/23 0300 10/29/23 0431  BP: 132/66   (!) 152/77  Pulse: 69   69  Resp: 14   14  Temp: 98.9 F (37.2 C)   97.7 F (36.5 C)  TempSrc: Oral   Oral  SpO2: 92% 93%  92%  Height:   5\' 1"  (1.549 m)    Weight change:   Physical Examination: General exam: alert awake, obese HEENT:Oral mucosa moist, Ear/Nose WNL grossly Respiratory system: Bilaterally clear breath sounds no use of accessory muscles  Cardiovascular system: S1 & S2 +, No JVD. Gastrointestinal system: Abdomen soft distended left lower quadrant paracentesis site with Leak Nervous System: Alert, awake, moving all extremities,and following commands. Extremities: LE edema neg,distal peripheral pulses palpable and warm.  Skin: No rashes,no icterus. MSK: Normal muscle bulk,tone, power   Medications reviewed:   Scheduled Meds:  ALPRAZolam  2 mg Oral QHS   arformoterol  15 mcg Nebulization BID   azithromycin  500 mg Oral Daily   benazepril  40 mg Oral Daily   budesonide (PULMICORT) nebulizer solution  0.25 mg Nebulization BID   cetirizine  10 mg Oral QPM   cloNIDine  0.2 mg Oral Daily   escitalopram  20 mg Oral Daily   estradiol  2 mg Oral Daily   furosemide  40 mg Oral Daily   insulin aspart  0-9 Units Subcutaneous TID WC   methylPREDNISolone (SOLU-MEDROL) injection  40 mg Intravenous Q12H   oxyCODONE  20 mg Oral 5 X Daily   pantoprazole  40 mg Oral BID AC   potassium chloride  40 mEq Oral Once   revefenacin  175 mcg Nebulization Daily   traZODone  100 mg Oral QHS   Continuous Infusions:    Diet Order             Diet heart healthy/carb modified Room service appropriate? Yes; Fluid consistency: Thin  Diet effective now                  Intake/Output Summary (Last 24 hours) at 10/29/2023 0815 Last data filed at  10/28/2023 0900 Gross per 24 hour  Intake 118 ml  Output --  Net 118 ml   Net IO Since Admission: 2,674.38 mL [10/29/23 0815]  Wt Readings from Last 3 Encounters:  09/11/23 103.4 kg  06/29/23 98.1 kg  02/03/23 96.6 kg     Unresulted Labs (From admission, onward)     Start     Ordered   10/29/23 0607  C-reactive protein  Add-on,   AD       Question:  Specimen collection method  Answer:  Lab=Lab collect   10/29/23 0606   10/29/23 0607  Procalcitonin  Add-on,   AD       References:    Procalcitonin Lower Respiratory Tract Infection AND Sepsis Procalcitonin Algorithm  Question:  Specimen collection method  Answer:  Lab=Lab collect   10/29/23 0606   10/27/23 0500  CBC  Daily,   R      10/26/23 0905   10/27/23 0500  Comprehensive metabolic panel  Daily,   R      10/26/23 0905          Data Reviewed: I have personally reviewed following labs and imaging studies CBC: Recent Labs  Lab 10/25/23 0454 10/26/23 0503 10/27/23 0531 10/28/23 0534 10/29/23 0516   WBC 3.7* 2.6* 5.2 5.2 3.2*  NEUTROABS 2.5  --   --   --   --   HGB 10.8* 10.8* 9.7* 11.0* 10.2*  HCT 36.1 36.8 33.9* 37.1 35.7*  MCV 85.3 86.6 87.4 87.3 87.3  PLT 125* 128* 104* 121* 123*   Basic Metabolic Panel:  Recent Labs  Lab 10/25/23 0451 10/25/23 0454 10/26/23 0503 10/27/23 0531 10/28/23 0534 10/29/23 0516  NA  --  140 137 136 135 136  K  --  3.0* 3.7 3.5 3.0* 3.3*  CL  --  98 95* 94* 90* 89*  CO2  --  33* 35* 36* 37* 35*  GLUCOSE  --  87 147* 128* 74 244*  BUN  --  5* 6* 11 11 13   CREATININE  --  0.69 0.78 0.86 0.69 0.72  CALCIUM  --  7.8* 8.0* 7.8* 7.7* 7.7*  MG 1.9  --   --   --   --   --   PHOS 2.1*  --   --   --   --   --    GFR: CrCl cannot be calculated (Unknown ideal weight.). Liver Function Tests:  Recent Labs  Lab 10/25/23 0454 10/26/23 0503 10/27/23 0531 10/28/23 0534 10/29/23 0516  AST 29 32 28 32 40  ALT 12 14 13 16 21   ALKPHOS 62 64 59 64 64  BILITOT 0.9 0.8 0.7 0.8 0.8  PROT 7.6 7.9 7.1 7.7 6.9  ALBUMIN 3.0* 3.2* 2.8* 2.9* 2.7*   Recent Labs  Lab 10/25/23 0454  LIPASE 33   No results for input(s): "AMMONIA" in the last 168 hours. Coagulation Profile:  Recent Labs  Lab 10/25/23 0454  INR 1.2   No results for input(s): "PROBNP" in the last 168 hours.  No results for input(s): "HGBA1C" in the last 72 hours.  Recent Labs  Lab 10/28/23 0743 10/28/23 1142 10/28/23 1634 10/28/23 2032 10/29/23 0744  GLUCAP 73 134* 234* 152* 191*   No results for input(s): "CHOL", "HDL", "LDLCALC", "TRIG", "CHOLHDL", "LDLDIRECT" in the last 72 hours. No results for input(s): "TSH", "T4TOTAL", "FREET4", "T3FREE", "THYROIDAB" in the last 72 hours. Sepsis Labs: Recent Labs  Lab 10/25/23 0451 10/25/23 0507 10/26/23 0503  PROCALCITON 3.50  --  1.99  LATICACIDVEN  --  0.7  --    Recent Results (from the past 240 hours)  Resp panel by RT-PCR (RSV, Flu A&B, Covid) Anterior Nasal Swab     Status: Abnormal   Collection Time: 10/25/23  4:13 AM    Specimen: Anterior Nasal Swab  Result Value Ref Range Status   SARS Coronavirus 2 by RT PCR POSITIVE (A) NEGATIVE Final    Comment: (NOTE) SARS-CoV-2 target nucleic acids are DETECTED.  The SARS-CoV-2 RNA is generally detectable in upper respiratory specimens during the acute phase of infection. Positive results are indicative of the presence of the identified virus, but do not rule out bacterial infection or co-infection with other pathogens not detected by the test. Clinical correlation with patient history and other diagnostic information is necessary to determine patient infection status. The expected result is Negative.  Fact Sheet for Patients: BloggerCourse.com  Fact Sheet for Healthcare Providers: SeriousBroker.it  This test is not yet approved or cleared by the [place] FDA and  has been authorized for detection and/or diagnosis of SARS-CoV-2 by FDA under an Emergency Use Authorization (EUA).  This EUA will remain in effect (meaning this test can be used) for the duration of  the COVID-19 declaration under Section 564(b)(1) of the A ct, 21 U.S.C. section 360bbb-3(b)(1), unless the authorization is terminated or revoked sooner.     Influenza A by PCR NEGATIVE NEGATIVE Final   Influenza B by PCR NEGATIVE NEGATIVE Final    Comment: (NOTE) The Xpert Xpress SARS-CoV-2/FLU/RSV plus assay is intended as an aid in the diagnosis of influenza from Nasopharyngeal swab specimens and should not be used as a sole basis for treatment. Nasal washings and aspirates are unacceptable for Xpert Xpress SARS-CoV-2/FLU/RSV testing.  Fact Sheet for Patients: BloggerCourse.com  Fact Sheet for Healthcare Providers: SeriousBroker.it  This test is not yet approved or cleared by the [place] FDA and has been authorized for detection and/or diagnosis of SARS-CoV-2 by FDA under an  Emergency Use Authorization (EUA). This EUA will remain in effect (meaning this test can be used) for the duration of the COVID-19 declaration under Section 564(b)(1) of the Act, 21 U.S.C. section 360bbb-3(b)(1), unless the authorization is terminated or revoked.     Resp Syncytial Virus by PCR NEGATIVE NEGATIVE Final    Comment: (NOTE) Fact Sheet for Patients: BloggerCourse.com  Fact Sheet for Healthcare Providers: SeriousBroker.it  This test is not yet approved or cleared by the [place] FDA and has been authorized for detection and/or diagnosis of SARS-CoV-2 by FDA under an Emergency Use Authorization (EUA). This EUA will remain in effect (meaning this test can be used) for the duration of the COVID-19 declaration under Section 564(b)(1) of the Act, 21 U.S.C. section 360bbb-3(b)(1), unless the authorization is terminated or revoked.  Performed at [facility], [address], [place], [place] [address]   Blood Culture (routine x 2)     Status: None (Preliminary result)   Collection Time: 10/25/23  4:54 AM   Specimen: BLOOD  Result Value Ref Range Status   Specimen Description   Final    BLOOD BLOOD RIGHT ARM Performed at [facility], [address] [place], [place] [address]    Special Requests   Final    Blood Culture adequate volume BOTTLES DRAWN AEROBIC AND ANAEROBIC Performed at [facility], [address] [place], [place] [address]    Culture   Final    NO GROWTH 3 DAYS  Performed at [facility], [address] [place], [place] [address]    Report Status PENDING  Incomplete  Blood Culture (routine x 2)     Status: None (Preliminary result)   Collection Time: 10/25/23  4:54 AM   Specimen: BLOOD  Result Value Ref Range Status   Specimen Description   Final    BLOOD LEFT ANTECUBITAL Performed at [facility], [address], [place], [place] [address]    Special Requests   Final    Blood Culture adequate volume BOTTLES DRAWN AEROBIC AND ANAEROBIC Performed at [facility], [address], [place], [place] [address]    Culture   Final    NO GROWTH 3 DAYS Performed at [facility], [address], [place], [place] [address]    Report Status PENDING  Incomplete  Body fluid culture w Gram Stain     Status: None   Collection Time: 10/25/23  8:19 AM   Specimen: Peritoneal Cavity; Peritoneal Fluid  Result Value Ref Range Status   Specimen Description   Final    PERITONEAL CAVITY Performed at [facility], [address], [place], [place] [address]    Special Requests   Final    NONE Performed at [facility], [address] [place], [place] [address]    Gram Stain NO WBC SEEN NO ORGANISMS SEEN   Final   Culture   Final    NO GROWTH 3 DAYS Performed at [facility], [address] [place], [place] [address]    Report Status 10/28/2023 FINAL  Final    Antimicrobials/Microbiology: Anti-infectives (From admission, onward)    Start     Dose/Rate Route Frequency Ordered Stop   10/26/23 1130  azithromycin (ZITHROMAX) tablet 500 mg        500 mg Oral Daily 10/26/23 1041 10/30/23 0959   10/26/23 1000  azithromycin (ZITHROMAX) 500 mg in sodium chloride 0.9 % 250 mL IVPB  Status:  Discontinued        500 mg 250 mL/hr over 60 Minutes Intravenous Every 24 hours 10/25/23 0957 10/26/23 1041   10/26/23 0600  cefTRIAXone (ROCEPHIN) 2 g in sodium chloride 0.9 % 100 mL IVPB        2 g 200 mL/hr over 30 Minutes Intravenous Every 24 hours 10/25/23 0957 10/29/23 0659   10/25/23 0715  azithromycin (ZITHROMAX) 500 mg in sodium chloride 0.9 % 250 mL IVPB        500 mg 250 mL/hr over 60 Minutes Intravenous  Once 10/25/23 0712 10/25/23 1225   10/25/23 0545  cefTRIAXone (ROCEPHIN) 2 g in sodium chloride 0.9 % 100 mL IVPB        2 g 200 mL/hr over 30  Minutes Intravenous Once 10/25/23 0532 10/25/23 0713         Component Value Date/Time   SDES  10/25/2023 0819    PERITONEAL CAVITY Performed at [facility], [address], [place], [place] [address]    SPECREQUEST  10/25/2023 2841    NONE Performed at [facility], [address], [place], [place] [address]    CULT  10/25/2023 [phone]    NO GROWTH 3 DAYS Performed at [facility], [address] [place], [place] [address]    REPTSTATUS 10/28/2023 FINAL 10/25/2023 4403  Radiology Studies: VAS [place] LOWER EXTREMITY VENOUS (DVT) Result Date: 10/28/2023  Lower Venous DVT Study Patient Name:  LOVETA  M Bulman  Date of Exam:   10/27/2023 Medical Rec #: [id number]     Accession #:    [id number] Date of Birth: [birth year]     Patient Gender: F Patient Age:   63 years Exam Location:  [facility] Procedure:      VAS [place] LOWER EXTREMITY VENOUS (DVT) Referring Phys: Chrystal Zeimet --------------------------------------------------------------------------------  Indications: Swelling.  Risk Factors: None identified. Limitations: Poor ultrasound/tissue interface. Comparison Study: No prior studies. Performing Technologist: Chanda Busing RVT  Examination Guidelines: A complete evaluation includes B-mode imaging, spectral Doppler, color Doppler, and power Doppler as needed of all accessible portions of each vessel. Bilateral testing is considered an integral part of a complete examination. Limited examinations for reoccurring indications may be performed as noted. The reflux portion of the exam is performed with the patient in reverse Trendelenburg.  +---------+---------------+---------+-----------+----------+--------------+ RIGHT    CompressibilityPhasicitySpontaneityPropertiesThrombus Aging +---------+---------------+---------+-----------+----------+--------------+ CFV      Full           Yes      Yes                                  +---------+---------------+---------+-----------+----------+--------------+ SFJ      Full                                                        +---------+---------------+---------+-----------+----------+--------------+ FV Prox  Full                                                        +---------+---------------+---------+-----------+----------+--------------+ FV Mid   Full                                                        +---------+---------------+---------+-----------+----------+--------------+ FV DistalFull                                                        +---------+---------------+---------+-----------+----------+--------------+ PFV      Full                                                        +---------+---------------+---------+-----------+----------+--------------+ POP      Full           Yes      Yes                                 +---------+---------------+---------+-----------+----------+--------------+ PTV      Full                                                        +---------+---------------+---------+-----------+----------+--------------+  PERO     Full                                                        +---------+---------------+---------+-----------+----------+--------------+   +---------+---------------+---------+-----------+----------+--------------+ LEFT     CompressibilityPhasicitySpontaneityPropertiesThrombus Aging +---------+---------------+---------+-----------+----------+--------------+ CFV      Full           Yes      Yes                                 +---------+---------------+---------+-----------+----------+--------------+ SFJ      Full                                                        +---------+---------------+---------+-----------+----------+--------------+ FV Prox  Full                                                         +---------+---------------+---------+-----------+----------+--------------+ FV Mid   Full                                                        +---------+---------------+---------+-----------+----------+--------------+ FV DistalFull                                                        +---------+---------------+---------+-----------+----------+--------------+ PFV      Full                                                        +---------+---------------+---------+-----------+----------+--------------+ POP      Full           Yes      Yes                                 +---------+---------------+---------+-----------+----------+--------------+ PTV      Full                                                        +---------+---------------+---------+-----------+----------+--------------+ PERO     Full                                                        +---------+---------------+---------+-----------+----------+--------------+  Summary: RIGHT: - There is no evidence of deep vein thrombosis in the lower extremity.  - No cystic structure found in the popliteal fossa.  LEFT: - There is no evidence of deep vein thrombosis in the lower extremity.  - No cystic structure found in the popliteal fossa.  *See table(s) above for measurements and observations. Electronically signed by Lemar Livings MD on 10/28/2023 at 10:49:51 AM.    Final    CT PELVIS W CONTRAST Result Date: 10/27/2023 CLINICAL DATA:  Pelvic pain.  Stress fracture suspected. EXAM: CT PELVIS WITH CONTRAST TECHNIQUE: Multidetector CT imaging of the pelvis was performed using the standard protocol following the bolus administration of intravenous contrast. RADIATION DOSE REDUCTION: This exam was performed according to the departmental dose-optimization program which includes automated exposure control, adjustment of the mA and/or kV according to patient size and/or use of iterative reconstruction technique.  CONTRAST:  OMNIPAQUE IOHEXOL 300 MG/ML  SOLN COMPARISON:  CT abdomen pelvis dated 10/25/2023. FINDINGS: Evaluation of this exam is limited in the absence of intravenous contrast. Urinary Tract: The visualized ureters and urinary bladder appear unremarkable. Bowel:  Sigmoid diverticulosis.  No bowel dilatation in the pelvis. Vascular/Lymphatic: No pathologically enlarged lymph nodes. No significant vascular abnormality seen. Reproductive:  Hysterectomy.  No suspicious adnexal masses. Other:  Small ascites.  Subcutaneous edema. Musculoskeletal: There is no acute fracture or dislocation. The bones are osteopenic. Bilateral L5 pars defects with grade 1 anterolisthesis. There is disc desiccation and vacuum phenomena at L5-S1. IMPRESSION: 1. No acute fracture or dislocation. 2. Sigmoid diverticulosis. 3. Small ascites. 4. Bilateral L5 pars defects with grade 1 anterolisthesis. Electronically Signed   By: Elgie Collard M.D.   On: 10/27/2023 17:48   LOS: 4 days  Total time spent in review of labs and imaging, patient evaluation, formulation of plan, documentation and communication with family: 35 minutes  Lanae Boast, MD  [facility]  10/29/2023, 8:15 AM

## 2023-10-30 DIAGNOSIS — J1282 Pneumonia due to coronavirus disease 2019: Secondary | ICD-10-CM | POA: Diagnosis not present

## 2023-10-30 DIAGNOSIS — U071 COVID-19: Secondary | ICD-10-CM | POA: Diagnosis not present

## 2023-10-30 LAB — GLUCOSE, CAPILLARY
Glucose-Capillary: 200 mg/dL — ABNORMAL HIGH (ref 70–99)
Glucose-Capillary: 143 mg/dL — ABNORMAL HIGH (ref 70–99)
Glucose-Capillary: 242 mg/dL — ABNORMAL HIGH (ref 70–99)
Glucose-Capillary: 240 mg/dL — ABNORMAL HIGH (ref 70–99)

## 2023-10-30 LAB — COMPREHENSIVE METABOLIC PANEL
ALT: 22 U/L (ref 0–44)
AST: 41 U/L (ref 15–41)
Albumin: 2.7 g/dL — ABNORMAL LOW (ref 3.5–5.0)
Alkaline Phosphatase: 63 U/L (ref 38–126)
Anion gap: 11 (ref 5–15)
BUN: 15 mg/dL (ref 8–23)
CO2: 33 mmol/L — ABNORMAL HIGH (ref 22–32)
Calcium: 8.4 mg/dL — ABNORMAL LOW (ref 8.9–10.3)
Chloride: 93 mmol/L — ABNORMAL LOW (ref 98–111)
Creatinine, Ser: 0.63 mg/dL (ref 0.44–1.00)
GFR, Estimated: >60 mL/min (ref >60)
Glucose, Bld: 175 mg/dL — ABNORMAL HIGH (ref 70–99)
Potassium: 4.3 mmol/L (ref 3.5–5.1)
Sodium: 137 mmol/L (ref 135–145)
Total Bilirubin: 0.5 mg/dL (ref 0.0–1.2)
Total Protein: 6.9 g/dL (ref 6.5–8.1)

## 2023-10-30 LAB — CBC
HCT: 36.5 % (ref 36.0–46.0)
Hemoglobin: 10.8 g/dL — ABNORMAL LOW (ref 12.0–15.0)
MCH: 25.4 pg — ABNORMAL LOW (ref 26.0–34.0)
MCHC: 29.6 g/dL — ABNORMAL LOW (ref 30.0–36.0)
MCV: 85.9 fL (ref 80.0–100.0)
Platelets: 148 10*3/uL — ABNORMAL LOW (ref 150–400)
RBC: 4.25 MIL/uL (ref 3.87–5.11)
RDW: 15.2 % (ref 11.5–15.5)
WBC: 6.6 10*3/uL (ref 4.0–10.5)
nRBC: 0 % (ref 0.0–0.2)

## 2023-10-30 LAB — CULTURE, BLOOD (ROUTINE X 2)
Culture: NO GROWTH
Culture: NO GROWTH
Special Requests: ADEQUATE
Special Requests: ADEQUATE

## 2023-10-30 NOTE — Evaluation (Signed)
Occupational Therapy Evaluation Patient Details Name: [name] MRN: [id number] DOB: 05/21/1961 Today's Date: 10/30/2023   History of Present Illness   Pt admitted from hom 2* abdominal pain and found to be COVID +ve superimposed on chronic respiratory failure.  pt with hx of CAD, COPD, DM, back surgery, Ascites, chronic repiratory failure with hypoxia, and L patella femoral arthroplasty.     Clinical Impressions Pt is at Mod I - Ind level with ADLs/selfcare, Sup for safety with ADL mobility using no AD. PTA pt lived with her husband and was Ind with ADLs, light meal prep. Pt reports she sits on shower seat to bathe, someitmes stands in the shower about once every 2 weeks but has fear of falling and would like assist with bathing from spouse being present. Pt normally walks without AD, using cane recently. Does not drive and husband does all of the cooking. All education completed and no acute OT services are indicated at this time. OT will sign off     If plan is discharge home, recommend the following:   Assistance with cooking/housework;Assist for transportation     Functional Status Assessment   Patient has not had a recent decline in their functional status     Equipment Recommendations   None recommended by OT     Recommendations for Other Services         Precautions/Restrictions   Precautions Precautions: Fall Restrictions Weight Bearing Restrictions Per Provider Order: No     Mobility Bed Mobility Overal bed mobility: Modified Independent                  Transfers Overall transfer level: Needs assistance Equipment used: None Transfers: Sit to/from Stand Sit to Stand: Supervision           General transfer comment: for safety only      Balance Overall balance assessment: Mild deficits observed, not formally tested                                         ADL either performed or assessed with clinical judgement    ADL Overall ADL's : Modified independent;Independent;At baseline                                       General ADL Comments: Sup for safety with ADL mobility, toilet transfers     Vision Baseline Vision/History: 1 Wears glasses Ability to See in Adequate Light: 0 Adequate Patient Visual Report: No change from baseline       Perception         Praxis         Pertinent Vitals/Pain Pain Assessment Pain Assessment: Faces Faces Pain Scale: Hurts little more Pain Location: abdomen Pain Intervention(s): Limited activity within patient's tolerance, Monitored during session, Repositioned, Patient requesting pain meds-RN notified, RN gave pain meds during session     Extremity/Trunk Assessment Upper Extremity Assessment Upper Extremity Assessment: Overall WFL for tasks assessed   Lower Extremity Assessment Lower Extremity Assessment: Defer to PT evaluation   Cervical / Trunk Assessment Cervical / Trunk Assessment: Normal   Communication Communication Communication: No apparent difficulties   Cognition Arousal: Alert Behavior During Therapy: WFL for tasks assessed/performed Cognition: No apparent impairments  Cueing  General Comments   Cueing Techniques: Verbal cues      Exercises     Shoulder Instructions      Home Living Family/patient expects to be discharged to:: Private residence Living Arrangements: Spouse/significant other Available Help at Discharge: Family;Available 24 hours/day Type of Home: House Home Access: Stairs to enter [employer] of Steps: 5 steps back entrance Entrance Stairs-Rails: Right Home Layout: One level     Bathroom Shower/Tub: Chief Strategy Officer: Standard Bathroom Accessibility: Yes   Home Equipment: Cane - single point;Rollator (4 wheels);Hand held shower head;Grab bars - tub/shower;Shower seat          Prior  Functioning/Environment Prior Level of Function : Independent/Modified Independent             Mobility Comments: normally walks without AD, using cane recently. Does not drive ADLs Comments: Ind with ADLs, light meal prep. Pt reports she sits on shower seat to bathe, someitmes stands in the shower about once every 2 weeks but has fear of falling and would like assist with bathing from spouse being present.    OT Problem List: Decreased activity tolerance;Pain   OT Treatment/Interventions: Self-care/ADL training;Therapeutic activities;Patient/family education      OT Goals(Current goals can be found in the care plan section)   Acute Rehab OT Goals Patient Stated Goal: go home   OT Frequency:       Co-evaluation              AM-PAC OT "6 Clicks" Daily Activity     Outcome Measure Help from another person eating meals?: None Help from another person taking care of personal grooming?: None Help from another person toileting, which includes using toliet, bedpan, or urinal?: None Help from another person bathing (including washing, rinsing, drying)?: None Help from another person to put on and taking off regular upper body clothing?: None Help from another person to put on and taking off regular lower body clothing?: None 6 Click Score: 24   End of Session Equipment Utilized During Treatment: Gait belt Nurse Communication: Mobility status  Activity Tolerance: Patient tolerated treatment well Patient left: in chair;with call bell/phone within reach  OT Visit Diagnosis: Unsteadiness on feet (R26.81)                Time: 0960-4540 OT Time Calculation (min): 20 min Charges:  OT General Charges $OT Visit: 1 Visit OT Evaluation $OT Eval Low Complexity: 1 Low   Galen Manila 10/30/2023, 1:45 PM

## 2023-10-30 NOTE — Progress Notes (Signed)
Progress Note   Patient: [name]:[id number] DOB: June 23, 1961 DOA: 10/25/2023     5 DOS: the patient was seen and examined on 10/30/2023   Brief hospital course: 28 YOF W/ history of chronic abdominal pain, chronic respiratory failure with hypoxia, anxiety, depression, osteoarthritis, nonalcoholic liver cirrhosis last paracentesis on 09/17/23 with 3.1 L of blood-tinged fluid who presented to the ED w/ complaints of exacerbation of chronic abdominal pain associated with fever, chills, fatigue, malaise, decreased appetite, nausea and vomiting along with dyspnea,wheezing and having an occasional productive cough at times.  Symptoms have been going for several days. In the ED: Vitals stable afebrile BP on higher side, labs showed hypokalemia 3.0 normal LFTs, renal function and chronic anemia with hemoglobin 10.8 g and platelet 1 25K, COVID-19 with positive UA unremarkable. CXR>>patchy, symmetric perihilar lung opacity more resembling multilobar infection then asymmetric pulmonary edema.  No consolidation or pleural effusion. CT abdomen/pelvis with contrast>>groundglass opacity in the lung bases bilateral suspicious for pneumonia.  Mildly enlarged subcarinal lymph node is partially visualized measuring up to 1.5 cm most likely inflammatory, however incompletely evaluated on this examination.  Progressive cirrhosis with moderate abdominal ascites. Patient was given electrolyte replacement antiemetics antibiotics and admitted Overall patient remains hemodynamically stable afebrile labs ok.  She was complaining of left buttock bone underwent CT pelvis along with duplex lower extremity no acute finding  Assessment and Plan: No notes have been filed under this hospital service. Service: Hospitalist  Acute COPD exacerbation COVID-19 infection CAP, bacterial Acute on chronic hypoxic respiratory failure -Improving -Still having respiratory stress with minimal activity, plan to keep patient on Pulmicort,  LABA, DuoNebs and as needed albuterol -Check ambulatory pulse ox may need to consider restarting home O2 therapy.  Patient reported that she used to have a oxygen concentrator at home but has not been using for more than 5 years. -Expect transfer from Solu-Medrol to p.o. prednisone in 24 hours.  Chronic pain syndrome Chronic narcotic dependence -Patient request high-dose of oxycodone 20 mg 4-5 times a day -Continue baclofen as needed  Decompensated cirrhosis with ascites NASH -Status post paracentesis with 2.2 L removal -Today patient appears to be euvolemic, continue p.o. Lasix  HTN -Continue benazepril -Continue clonidine -Continue p.o. Lasix  Chronic constipation -Refused stool softener and laxative  Subjective: Patient still have some wheezing associated with minimal activity, dry cough denies any chest pain no fever or chills.  Physical Exam: Vitals:   10/30/23 0600 10/30/23 0616 10/30/23 0812 10/30/23 1159  BP:  (!) 155/86  139/72  Pulse:  64  64  Resp:    18  Temp: 98.7 F (37.1 C)   97.7 F (36.5 C)  TempSrc: Oral     SpO2:  96% 97% 95%  Weight: 100.9 kg     Height:       Eyes: PERRL, lids and conjunctivae normal ENMT: Mucous membranes are moist. Posterior pharynx clear of any exudate or lesions.Normal dentition.  Neck: normal, supple, no masses, no thyromegaly Respiratory: clear to auscultation bilaterally, scattered, no crackles.  Mild increasing respiratory effort. No accessory muscle use.  Cardiovascular: Regular rate and rhythm, no murmurs / rubs / gallops. No extremity edema. 2+ pedal pulses. No carotid bruits.  Abdomen: no tenderness, no masses palpated. No hepatosplenomegaly. Bowel sounds positive.  Musculoskeletal: no clubbing / cyanosis. No joint deformity upper and lower extremities. Good ROM, no contractures. Normal muscle tone.  Skin: no rashes, lesions, ulcers. No induration Neurologic: CN 2-12 grossly intact. Sensation intact, DTR normal.  Muscle  strength 5/5 on both sides Psychiatric: Normal judgment and insight. Alert and oriented x 3. Normal mood.    Data Reviewed: None  Family Communication: None at bedside  Disposition: Status is: Inpatient Remains inpatient appropriate because: Patient continued to have COPD exacerbation symptoms and signs of exertional dyspnea requiring IV Solu-Medrol and around-the-clock breathing treatment, still requiring inpatient care  Planned Discharge Destination: Home    Time spent: 35 minutes  Author: Emeline General, MD 10/30/2023 3:42 PM  For on call review [email or web address].

## 2023-10-30 NOTE — Progress Notes (Signed)
Pt states she has not had a BM x 4 days. Pt is refusing miralax and dulcolax suppository. Pt states she is not willing to take or use anything to promote a BM. Educated pt on the importance of having a BM and not becoming constipated, also on a BM relieving pressure on her ABD which could possibly aide in relieving pain. Pt continued to refuse.

## 2023-10-30 NOTE — TOC Progression Note (Signed)
Transition of Care [facility]) - Progression Note    Patient Details  Name: [name] MRN: [id number] Date of Birth: 1961-05-17  Transition of Care [facility]) CM/SW Contact  Adrian Prows, RN Phone Number: 10/30/2023, 11:04 AM  Clinical Narrative:    Awaiting PT/OT evals; remains on oxygen; TOC is following.   Expected Discharge Plan: Home/Self Care Barriers to Discharge: Continued Medical Work up  Expected Discharge Plan and Services   Discharge Planning Services: CM Consult   Living arrangements for the past 2 months: Single Family Home                                       Social Determinants of Health (SDOH) Interventions SDOH Screenings   Food Insecurity: No Food Insecurity (10/26/2023)  Housing: Low Risk  (10/26/2023)  Transportation Needs: Patient Declined (10/25/2023)  Utilities: Not At Risk (10/25/2023)  Social Connections: Unknown (01/27/2022)   [facility] from [facility]  Tobacco Use: High Risk (10/25/2023)    Readmission Risk Interventions    10/27/2023    2:23 PM  Readmission Risk Prevention Plan  Transportation Screening Complete  Medication Review (RN Care Manager) Complete  PCP or Specialist appointment within 3-5 days of discharge Complete  HRI or Home Care Consult Complete  SW Recovery Care/Counseling Consult Complete  Palliative Care Screening Not Applicable  Skilled Nursing Facility Not Applicable

## 2023-10-30 NOTE — Progress Notes (Signed)
Pt has been insistent on getting "extra" oxycodone tablets between scheduled doses. Pt states her pain management MD instructed her to take "extra" oxycodone tablets whenever she needs to. However, each time RN enters room or opens door to check in on pt, she is sleeping soundly. She does not appear to be uncomfortable and resting quite frequently throughout the day.   Pt reports she has abd pain whenever she eats anything; although, pt is constantly eating ans asking for additional food items as [place] [employer] or graham crackers.

## 2023-10-30 NOTE — Evaluation (Signed)
Physical Therapy Evaluation Patient Details Name: [name] MRN: [id number] DOB: 05-Feb-1961 Today's Date: 10/30/2023  History of Present Illness  Pt admitted from hom 2* abdominal pain and found to be COVID +ve superimposed on chronic respiratory failure.  pt with hx of CAD, COPD, DM, back surgery, Ascites, chronic repiratory failure with hypoxia, and L patella femoral arthroplasty.  Clinical Impression  Pt admitted as above and presenting with functional mobility limitations 2* generalized weakness, mild ambulatory balance deficits, and decreased activity tolerance.  This date, pt up to ambulate 100' in halls with CGA/sup assist.  Pt on RA and maintained sats at 91% or higher with highest HR noted in mid 80s.  Pt should progress to dc home with family assist.        If plan is discharge home, recommend the following: A little help with bathing/dressing/bathroom;Assistance with cooking/housework;Assist for transportation;Help with stairs or ramp for entrance   Can travel by private vehicle        Equipment Recommendations None recommended by PT  Recommendations for Other Services       Functional Status Assessment Patient has had a recent decline in their functional status and demonstrates the ability to make significant improvements in function in a reasonable and predictable amount of time.     Precautions / Restrictions Precautions Precautions: Fall Restrictions Weight Bearing Restrictions Per Provider Order: No      Mobility  Bed Mobility Overal bed mobility: Modified Independent             General bed mobility comments: Pt up to EOB unassisted but using bedrail    Transfers Overall transfer level: Needs assistance Equipment used: None Transfers: Sit to/from Stand Sit to Stand: Supervision           General transfer comment: for safety only    Ambulation/Gait Ambulation/Gait assistance: Contact guard assist, Supervision Gait Distance (Feet): 105  Feet Assistive device: None Gait Pattern/deviations: Step-through pattern, Decreased step length - right, Decreased step length - left, Shuffle, Wide base of support       General Gait Details: Steady pace with widened BOS and mild instability but no overt LOB noted.  Pt notes similar to PLOF at home  Stairs            Wheelchair Mobility     Tilt Bed    Modified Rankin (Stroke Patients Only)       Balance Overall balance assessment: Mild deficits observed, not formally tested                                           Pertinent Vitals/Pain Pain Assessment Pain Assessment: Faces Faces Pain Scale: Hurts little more Pain Location: abdomen Pain Intervention(s): Limited activity within patient's tolerance, Monitored during session, Premedicated before session, Patient requesting pain meds-RN notified    Home Living Family/patient expects to be discharged to:: Private residence Living Arrangements: Spouse/significant other Available Help at Discharge: Family;Available 24 hours/day Type of Home: House Home Access: Stairs to enter Entrance Stairs-Rails: Right Entrance Stairs-Number of Steps: 5 steps back entrance   Home Layout: One level Home Equipment: Cane - single point;Rollator (4 wheels);Hand held shower head;Grab bars - tub/shower;Shower seat      Prior Function Prior Level of Function : Independent/Modified Independent             Mobility Comments: normally walks without AD, using cane recently.  Does not drive       Extremity/Trunk Assessment   Upper Extremity Assessment Upper Extremity Assessment: Defer to OT evaluation    Lower Extremity Assessment Lower Extremity Assessment: Generalized weakness    Cervical / Trunk Assessment Cervical / Trunk Assessment: Normal  Communication   Communication Communication: No apparent difficulties    Cognition Arousal: Alert Behavior During Therapy: WFL for tasks assessed/performed                              Following commands: Intact       Cueing Cueing Techniques: Verbal cues     General Comments      Exercises     Assessment/Plan    PT Assessment Patient needs continued PT services  PT Problem List Decreased strength;Decreased activity tolerance;Decreased balance;Decreased mobility       PT Treatment Interventions DME instruction;Gait training;Stair training;Functional mobility training;Therapeutic activities;Therapeutic exercise;Balance training;Patient/family education    PT Goals (Current goals can be found in the Care Plan section)  Acute Rehab PT Goals Patient Stated Goal: Regain IND and return home PT Goal Formulation: With patient Time For Goal Achievement: 11/13/23 Potential to Achieve Goals: Good    Frequency Min 1X/week     Co-evaluation               AM-PAC PT "6 Clicks" Mobility  Outcome Measure Help needed turning from your back to your side while in a flat bed without using bedrails?: None Help needed moving from lying on your back to sitting on the side of a flat bed without using bedrails?: None Help needed moving to and from a bed to a chair (including a wheelchair)?: A Little Help needed standing up from a chair using your arms (e.g., wheelchair or bedside chair)?: A Little Help needed to walk in hospital room?: A Little Help needed climbing 3-5 steps with a railing? : A Little 6 Click Score: 20    End of Session Equipment Utilized During Treatment: Gait belt Activity Tolerance: Patient tolerated treatment well Patient left: in chair;with call bell/phone within reach Nurse Communication: Mobility status PT Visit Diagnosis: Difficulty in walking, not elsewhere classified (R26.2)    Time: 8295-6213 PT Time Calculation (min) (ACUTE ONLY): 23 min   Charges:   PT Evaluation $PT Eval Low Complexity: 1 Low   PT General Charges $$ ACUTE PT VISIT: 1 Visit         Mauro Kaufmann PT Acute  Rehabilitation Services Pager [phone] Office [phone]   Derriana Oser 10/30/2023, 12:53 PM

## 2023-10-31 DIAGNOSIS — K7460 Unspecified cirrhosis of liver: Secondary | ICD-10-CM | POA: Diagnosis not present

## 2023-10-31 DIAGNOSIS — J1282 Pneumonia due to coronavirus disease 2019: Secondary | ICD-10-CM | POA: Diagnosis not present

## 2023-10-31 DIAGNOSIS — M545 Low back pain, unspecified: Secondary | ICD-10-CM | POA: Diagnosis not present

## 2023-10-31 DIAGNOSIS — M2550 Pain in unspecified joint: Secondary | ICD-10-CM | POA: Diagnosis not present

## 2023-10-31 DIAGNOSIS — R109 Unspecified abdominal pain: Secondary | ICD-10-CM | POA: Diagnosis not present

## 2023-10-31 DIAGNOSIS — G8929 Other chronic pain: Secondary | ICD-10-CM | POA: Diagnosis not present

## 2023-10-31 DIAGNOSIS — J449 Chronic obstructive pulmonary disease, unspecified: Secondary | ICD-10-CM | POA: Diagnosis not present

## 2023-10-31 DIAGNOSIS — Z79899 Other long term (current) drug therapy: Secondary | ICD-10-CM | POA: Diagnosis not present

## 2023-10-31 DIAGNOSIS — M542 Cervicalgia: Secondary | ICD-10-CM | POA: Diagnosis not present

## 2023-10-31 DIAGNOSIS — U071 COVID-19: Secondary | ICD-10-CM | POA: Diagnosis not present

## 2023-10-31 LAB — CBC
HCT: 36.2 % (ref 36.0–46.0)
Hemoglobin: 10.6 g/dL — ABNORMAL LOW (ref 12.0–15.0)
MCH: 25.1 pg — ABNORMAL LOW (ref 26.0–34.0)
MCHC: 29.3 g/dL — ABNORMAL LOW (ref 30.0–36.0)
MCV: 85.6 fL (ref 80.0–100.0)
Platelets: 165 10*3/uL (ref 150–400)
RBC: 4.23 MIL/uL (ref 3.87–5.11)
RDW: 15.1 % (ref 11.5–15.5)
WBC: 6.7 10*3/uL (ref 4.0–10.5)
nRBC: 0 % (ref 0.0–0.2)

## 2023-10-31 LAB — COMPREHENSIVE METABOLIC PANEL
ALT: 113 U/L — ABNORMAL HIGH (ref 0–44)
AST: 147 U/L — ABNORMAL HIGH (ref 15–41)
Albumin: 2.6 g/dL — ABNORMAL LOW (ref 3.5–5.0)
Alkaline Phosphatase: 107 U/L (ref 38–126)
Anion gap: 8 (ref 5–15)
BUN: 18 mg/dL (ref 8–23)
CO2: 32 mmol/L (ref 22–32)
Calcium: 8 mg/dL — ABNORMAL LOW (ref 8.9–10.3)
Chloride: 94 mmol/L — ABNORMAL LOW (ref 98–111)
Creatinine, Ser: 0.65 mg/dL (ref 0.44–1.00)
GFR, Estimated: >60 mL/min (ref >60)
Glucose, Bld: 344 mg/dL — ABNORMAL HIGH (ref 70–99)
Potassium: 4 mmol/L (ref 3.5–5.1)
Sodium: 134 mmol/L — ABNORMAL LOW (ref 135–145)
Total Bilirubin: 0.4 mg/dL (ref 0.0–1.2)
Total Protein: 7.1 g/dL (ref 6.5–8.1)

## 2023-10-31 LAB — GLUCOSE, CAPILLARY
Glucose-Capillary: 219 mg/dL — ABNORMAL HIGH (ref 70–99)
Glucose-Capillary: 132 mg/dL — ABNORMAL HIGH (ref 70–99)

## 2023-10-31 MED ORDER — PREDNISONE 10 MG PO TABS: ORAL_TABLET | ORAL | 0 refills | Status: AC

## 2023-10-31 NOTE — Discharge Summary (Signed)
Physician Discharge Summary  [name]:[id number] DOB: 1961-02-21 DOA: 10/25/2023  PCP: Noberto Retort, MD  Admit date: 10/25/2023 Discharge date: 10/31/2023  Admitted From: Home Disposition: Home  Recommendations for Outpatient Follow-up:  Follow up with PCP in 1-2 weeks Follow-up with pain management as scheduled Follow-up with outpatient gastroenterology as needed for cirrhosis and need for recurrent paracentesis Continue prednisone taper on discharge for COPD exacerbation secondary to COVID-19 viral infection  Home Health: No Equipment/Devices: None  Discharge Condition: Stable CODE STATUS: Full code Diet recommendation: Heart healthy/low-salt diet  History of present illness:  [name] is a 63 year old female with past medical history significant for nonalcoholic liver cirrhosis requiring outpatient paracentesis as needed, anxiety/depression, chronic pain syndrome, GERD, HTN, HLD, migraine headache, who presented to [facility] ED on 10/25/2023 with complaints of exacerbation of her chronic abdominal pain associated with fever, chills, fatigue, malaise, decreased appetite, nausea/vomiting and progressive shortness of breath with wheezing.  Endorses occasional productive cough, ongoing for several days.  Positive sick contact with her husband recently contracting COVID-19 viral infection.  In the ED, temperature 98.4 F, HR 78, RR 16, BP 164/82, SpO2 91% on 3 L nasal cannula (not oxygen dependent at baseline).  WBC 3.7, hemoglobin 10.8, platelet count 125.  Sodium 140, potassium 3.0, chloride 98, CO2 33, glucose 87, BUN 5, creatinine 0.69.  AST 29, ALT 12, total bili 0.9.  Lactic acid 0.7.  Procalcitonin 3.50.  COVID PCR positive.  Influenza/RSV PCR negative.  Urinalysis unrevealing.  Blood cultures x 2 obtained. Chest x-ray with patchy asymmetric perihilar lung opacities consistent with multilobar infection.  CT abdomen/pelvis with contrast with groundglass opacities  lung bases bilaterally consistent with pneumonia, mildly enlarged subcarinal lymph node partially visualized measuring up to 1.5 cm likely inflammatory, progressive cirrhosis with moderate abdominal ascites. The patient received azithromycin 500 mg IVPB, ceftriaxone 2 g IVPB, morphine 4 mg IVP x 3, ondansetron 4 mg IVP x 2, KCl 40 mEq p.o. x 1 and I added prochlorperazine 10 mg plus hydromorphone 1 mg IVP x 1 each.  Dr. Elayne Snare performed a diagnostic paracentesis.  TRH consulted for admission for further evaluation management of acute hypoxic respite failure secondary to COVID viral pneumonia, bacterial pneumonia; recurrent ascites  Hospital course:  Acute hypoxic respiratory failure COVID-19 viral infection Community-acquired pneumonia Patient presenting to the ED with progressive shortness of breath.  Recent sick contact with husband contracting COVID-19 viral pneumonia.  Patient was afebrile without leukocytosis.  COVID-19 PCR positive.  Influenza A/B and RSV PCR negative.  Imaging consistent with pneumonia.  Patient completed course of antibiotics with azithromycin and ceftriaxone during hospitalization.  Patient was initially requiring supplemental oxygen which was titrated off at time of discharge.  Outpatient follow-up with PCP.  COPD exacerbation Patient presenting with shortness of breath, fever chills as above secondary to COVID-19 viral infection with subsequent secondary bacterial pneumonia.  Patient was hypoxic requiring supplemental oxygen at time of presentation.  Patient was started on treatment as above in addition to scheduled neb treatments, IV steroids with improvement of symptoms.  Patient was weaned from supplemental oxygen with no desaturation on ambulation.  Patient will continue prednisone taper on discharge.  Resume home Pulmicort neb, albuterol neb.  Hypokalemia Repleted during hospitalization.  Anxiety/depression Continue Lexapro 20 mg p.o. daily.  Alprazolam 1-2 mg as  needed.  Essential hypertension Continue benazepril 40 mg p.o. daily, amlodipine 5 mg p.o. daily, clonidine, Lasix  Decompensated nonalcoholic liver cirrhosis with recurrent ascites Patient received  diagnostic paracentesis on admission 2/9 with culture showing no growth.  Patient underwent repeat paracentesis by IR on 10/26/2023 with 2.2 L fluid removed.  Follow-up with gastroenterology outpatient.  Continue intermittent outpatient paracentesis.  Low-salt diet.  Continue furosemide 40 mg p.o. daily.  GERD Continue Protonix 40 mg p.o. twice daily  Chronic pain syndrome Follows with pain management outpatient.  Continue home oxycodone, meloxicam.   Morbid obesity, class III Body mass index is 42.03 kg/m.  Complicates all facets of care  Discharge Diagnoses:  Principal Problem:   Pneumonia due to COVID-19 virus Active Problems:   Cirrhosis of liver with ascites ([name])   Chronic respiratory failure with hypoxia ([name])   Type 2 diabetes mellitus with obesity ([name])   CAD (coronary artery disease)   Hypophosphatemia   Hyperlipidemia associated with type 2 diabetes mellitus ([name])   Depression with anxiety   OSA (obstructive sleep apnea)   NAFLD (nonalcoholic fatty liver disease)   Pancytopenia ([name])   Essential hypertension    Discharge Instructions  Discharge Instructions     Call MD for:  difficulty breathing, headache or visual disturbances   Complete by: As directed    Call MD for:  extreme fatigue   Complete by: As directed    Call MD for:  persistant dizziness or light-headedness   Complete by: As directed    Call MD for:  persistant nausea and vomiting   Complete by: As directed    Call MD for:  severe uncontrolled pain   Complete by: As directed    Call MD for:  temperature >100.4   Complete by: As directed    Diet - low sodium heart healthy   Complete by: As directed    Increase activity slowly   Complete by: As directed       Allergies as of 10/31/2023        Reactions   Spironolactone Nausea Only   Bactrim [sulfamethoxazole-trimethoprim] Hives, Other (See Comments)   Hives all over body   Gabapentin Other (See Comments)   Sleepwalking   Amoxicillin Nausea And Vomiting, Other (See Comments)   Tolerated augmentin in the past (03/2013) without issue.   Clindamycin/lincomycin Nausea And Vomiting   Doxycycline Nausea And Vomiting   Influenza Virus Vaccine Other (See Comments)   Mother developed Guillain-Barr syndrome   Linzess [linaclotide] Nausea And Vomiting   Metronidazole Nausea Only   Treximet [sumatriptan-naproxen Sodium] Nausea And Vomiting        Medication List     STOP taking these medications    ciprofloxacin 500 MG tablet Commonly known as: CIPRO   linaclotide 145 MCG Caps capsule Commonly known as: LINZESS   magnesium oxide 400 (240 Mg) MG tablet Commonly known as: MAG-OX       TAKE these medications    acetaminophen 500 MG tablet Commonly known as: TYLENOL Take 500-1,000 mg by mouth every 6 (six) hours as needed for mild pain (pain score 1-3) or headache.   albuterol (2.5 MG/3ML) 0.083% nebulizer solution Commonly known as: PROVENTIL Take 2.5 mg by nebulization every 6 (six) hours as needed for wheezing or shortness of breath.   albuterol 108 (90 Base) MCG/ACT inhaler Commonly known as: VENTOLIN HFA Inhale 2 puffs into the lungs every 6 (six) hours as needed for wheezing or shortness of breath.   ALPRAZolam 1 MG tablet Commonly known as: XANAX Take 1-2 mg by mouth See admin instructions. Take 2 mg by mouth at bedtime and an additional 1 mg once a day  as needed for anxiety   amLODipine 5 MG tablet Commonly known as: NORVASC Take 5 mg by mouth daily.   baclofen 10 MG tablet Commonly known as: LIORESAL Take 1 tablet (10 mg total) by mouth 3 (three) times daily. What changed:  when to take this reasons to take this   benazepril 40 MG tablet Commonly known as: LOTENSIN Take 40 mg by mouth daily.    bisacodyl 10 MG suppository Commonly known as: DULCOLAX Place 1 suppository (10 mg total) rectally daily as needed for moderate constipation.   budesonide 1 MG/2ML nebulizer solution Commonly known as: PULMICORT Take 1 mg by nebulization daily as needed (for shortness of breath- when sick).   cloNIDine 0.2 MG tablet Commonly known as: CATAPRES Take 0.2 mg by mouth daily. What changed: Another medication with the same name was removed. Continue taking this medication, and follow the directions you see here.   cyclobenzaprine 10 MG tablet Commonly known as: FLEXERIL Take 10 mg by mouth 3 (three) times daily as needed for muscle spasms.   escitalopram 20 MG tablet Commonly known as: LEXAPRO Take 20 mg by mouth daily.   estradiol 2 MG tablet Commonly known as: ESTRACE Take 2 mg by mouth daily.   fluticasone 50 MCG/ACT nasal spray Commonly known as: FLONASE Place 1 spray into both nostrils daily as needed for allergies.   furosemide 40 MG tablet Commonly known as: LASIX Take 1 tablet (40 mg total) by mouth daily. Take additional 40 mg when you have a weight gain of 3 lbs in 1 day or 5 lbs in 1 week. What changed:  when to take this additional instructions   glycopyrrolate 1 MG tablet Commonly known as: ROBINUL Take 1-2 mg by mouth daily as needed (for excessive saliva).   lactulose 10 GM/15ML solution Commonly known as: CHRONULAC Take 15 mLs by mouth 2 (two) times daily as needed for mild constipation.   levocetirizine 5 MG tablet Commonly known as: XYZAL Take 5 mg by mouth every evening.   lidocaine 5 % ointment Commonly known as: XYLOCAINE Apply 1 application topically 2 (two) times daily as needed (hemorrhoids). What changed: Another medication with the same name was changed. Make sure you understand how and when to take each.   lidocaine 5 % Commonly known as: Lidoderm Place 1 patch onto the skin daily. Remove & Discard patch within 12 hours or as directed by  MD What changed:  when to take this reasons to take this   meloxicam 15 MG tablet Commonly known as: MOBIC Take 15 mg by mouth daily.   mupirocin ointment 2 % Commonly known as: BACTROBAN Apply 1 Application topically 2 (two) times daily as needed ("for sores in the nose").   Narcan 4 MG/0.1ML Liqd nasal spray kit Generic drug: naloxone Place 1 spray into the nose as needed (for overdose).   nystatin powder Commonly known as: MYCOSTATIN/NYSTOP Apply 1 g topically 4 (four) times daily as needed (irritation).   ondansetron 8 MG tablet Commonly known as: ZOFRAN Take 8 mg by mouth every 8 (eight) hours as needed for nausea or vomiting.   Oxycodone HCl 20 MG Tabs Take 20 mg by mouth See admin instructions. Take 20 mg by mouth every 4 hours- max of 5 tablets/24 hours   oxyCODONE 15 MG immediate release tablet Commonly known as: ROXICODONE Take 1 tablet (15 mg total) by mouth every 8 (eight) hours as needed for pain.   Ozempic (1 MG/DOSE) 4 MG/3ML Sopn Generic drug: Semaglutide (  1 MG/DOSE) Inject 1 mg into the skin every Monday.   pantoprazole 40 MG tablet Commonly known as: PROTONIX Take 40 mg by mouth 2 (two) times daily before a meal.   polyethylene glycol 17 g packet Commonly known as: MIRALAX / GLYCOLAX Take 17 g by mouth 2 (two) times daily. What changed:  when to take this reasons to take this   predniSONE 10 MG tablet Commonly known as: DELTASONE Take 4 tablets (40 mg total) by mouth daily for 2 days, THEN 3 tablets (30 mg total) daily for 2 days, THEN 2 tablets (20 mg total) daily for 2 days, THEN 1 tablet (10 mg total) daily for 2 days. Start taking on: October 31, 2023   prochlorperazine 10 MG tablet Commonly known as: COMPAZINE Take 10 mg by mouth every 6 (six) hours as needed for nausea or vomiting (not relieved by Zofran).   promethazine 25 MG tablet Commonly known as: PHENERGAN Take 25 mg by mouth every 8 (eight) hours as needed for nausea or  vomiting.   REFRESH DRY EYE THERAPY OP Place 1 drop into both eyes 3 (three) times daily as needed (for dryness).   rizatriptan 10 MG disintegrating tablet Commonly known as: MAXALT-MLT Take 10 mg by mouth as needed for migraine (may repeat once in 2 hours, if no relief- dissolve orally).   Phazyme Maximum Strength 250 MG Caps Generic drug: Simethicone Take 250 mg by mouth 2 (two) times daily as needed (for gas).   Simethicone 80 MG Tabs Take 1 tablet (80 mg total) by mouth in the morning, at noon, in the evening, and at bedtime.   simvastatin 20 MG tablet Commonly known as: ZOCOR Take 20 mg by mouth at bedtime.   topiramate 50 MG tablet Commonly known as: TOPAMAX Take 50 mg by mouth daily as needed (migraines).   traZODone 50 MG tablet Commonly known as: DESYREL Take 100 mg by mouth at bedtime.   triamcinolone cream 0.1 % Commonly known as: KENALOG Apply 1 application  topically 2 (two) times daily as needed (for rashes).   VAGISIL EX Apply 1 application topically daily.        Follow-up Information     Noberto Retort, MD. Schedule an appointment as soon as possible for a visit in 1 week(s).   Specialty: Family Medicine Contact information: [phone] [address] [place] [address] [phone]                Allergies  Allergen Reactions   Spironolactone Nausea Only   Bactrim [Sulfamethoxazole-Trimethoprim] Hives and Other (See Comments)    Hives all over body   Gabapentin Other (See Comments)    Sleepwalking    Amoxicillin Nausea And Vomiting and Other (See Comments)    Tolerated augmentin in the past (03/2013) without issue.   Clindamycin/Lincomycin Nausea And Vomiting   Doxycycline Nausea And Vomiting   Influenza Virus Vaccine Other (See Comments)    Mother developed Guillain-Barr syndrome   Linzess [Linaclotide] Nausea And Vomiting   Metronidazole Nausea Only   Treximet [Sumatriptan-Naproxen Sodium] Nausea And Vomiting     Consultations: Interventional radiology   Procedures/Studies: VAS [place] LOWER EXTREMITY VENOUS (DVT) Result Date: 10/28/2023  Lower Venous DVT Study Patient Name:  VALISHA HESLIN  Date of Exam:   10/27/2023 Medical Rec #: [id number]     Accession #:    [id number] Date of Birth: April 28, 1961     Patient Gender: F Patient Age:   63 years Exam Location:  [facility] Procedure:      VAS [place] LOWER EXTREMITY VENOUS (DVT) Referring Phys: RAMESH KC --------------------------------------------------------------------------------  Indications: Swelling.  Risk Factors: None identified. Limitations: Poor ultrasound/tissue interface. Comparison Study: No prior studies. Performing Technologist: Chanda Busing RVT  Examination Guidelines: A complete evaluation includes B-mode imaging, spectral Doppler, color Doppler, and power Doppler as needed of all accessible portions of each vessel. Bilateral testing is considered an integral part of a complete examination. Limited examinations for reoccurring indications may be performed as noted. The reflux portion of the exam is performed with the patient in reverse Trendelenburg.  +---------+---------------+---------+-----------+----------+--------------+ RIGHT    CompressibilityPhasicitySpontaneityPropertiesThrombus Aging +---------+---------------+---------+-----------+----------+--------------+ CFV      Full           Yes      Yes                                 +---------+---------------+---------+-----------+----------+--------------+ SFJ      Full                                                        +---------+---------------+---------+-----------+----------+--------------+ FV Prox  Full                                                        +---------+---------------+---------+-----------+----------+--------------+ FV Mid   Full                                                         +---------+---------------+---------+-----------+----------+--------------+ FV DistalFull                                                        +---------+---------------+---------+-----------+----------+--------------+ PFV      Full                                                        +---------+---------------+---------+-----------+----------+--------------+ POP      Full           Yes      Yes                                 +---------+---------------+---------+-----------+----------+--------------+ PTV      Full                                                        +---------+---------------+---------+-----------+----------+--------------+ PERO     Full                                                        +---------+---------------+---------+-----------+----------+--------------+   +---------+---------------+---------+-----------+----------+--------------+  LEFT     CompressibilityPhasicitySpontaneityPropertiesThrombus Aging +---------+---------------+---------+-----------+----------+--------------+ CFV      Full           Yes      Yes                                 +---------+---------------+---------+-----------+----------+--------------+ SFJ      Full                                                        +---------+---------------+---------+-----------+----------+--------------+ FV Prox  Full                                                        +---------+---------------+---------+-----------+----------+--------------+ FV Mid   Full                                                        +---------+---------------+---------+-----------+----------+--------------+ FV DistalFull                                                        +---------+---------------+---------+-----------+----------+--------------+ PFV      Full                                                         +---------+---------------+---------+-----------+----------+--------------+ POP      Full           Yes      Yes                                 +---------+---------------+---------+-----------+----------+--------------+ PTV      Full                                                        +---------+---------------+---------+-----------+----------+--------------+ PERO     Full                                                        +---------+---------------+---------+-----------+----------+--------------+     Summary: RIGHT: - There is no evidence of deep vein thrombosis in the lower extremity.  - No cystic structure found in the popliteal fossa.  LEFT: - There is no evidence of deep vein thrombosis in the lower extremity.  - No  cystic structure found in the popliteal fossa.  *See table(s) above for measurements and observations. Electronically signed by Lemar Livings MD on 10/28/2023 at 10:49:51 AM.    Final    CT PELVIS W CONTRAST Result Date: 10/27/2023 CLINICAL DATA:  Pelvic pain.  Stress fracture suspected. EXAM: CT PELVIS WITH CONTRAST TECHNIQUE: Multidetector CT imaging of the pelvis was performed using the standard protocol following the bolus administration of intravenous contrast. RADIATION DOSE REDUCTION: This exam was performed according to the departmental dose-optimization program which includes automated exposure control, adjustment of the mA and/or kV according to patient size and/or use of iterative reconstruction technique. CONTRAST:  OMNIPAQUE IOHEXOL 300 MG/ML  SOLN COMPARISON:  CT abdomen pelvis dated 10/25/2023. FINDINGS: Evaluation of this exam is limited in the absence of intravenous contrast. Urinary Tract: The visualized ureters and urinary bladder appear unremarkable. Bowel:  Sigmoid diverticulosis.  No bowel dilatation in the pelvis. Vascular/Lymphatic: No pathologically enlarged lymph nodes. No significant vascular abnormality seen. Reproductive:   Hysterectomy.  No suspicious adnexal masses. Other:  Small ascites.  Subcutaneous edema. Musculoskeletal: There is no acute fracture or dislocation. The bones are osteopenic. Bilateral L5 pars defects with grade 1 anterolisthesis. There is disc desiccation and vacuum phenomena at L5-S1. IMPRESSION: 1. No acute fracture or dislocation. 2. Sigmoid diverticulosis. 3. Small ascites. 4. Bilateral L5 pars defects with grade 1 anterolisthesis. Electronically Signed   By: Elgie Collard M.D.   On: 10/27/2023 17:48   US Paracentesis Result Date: 10/26/2023 INDICATION: Patient with a history of cirrhosis presents today with ascites. Interventional radiology asked to perform a therapeutic paracentesis. EXAM: ULTRASOUND GUIDED PARACENTESIS MEDICATIONS: 1% lidocaine 10 mL COMPLICATIONS: None immediate. PROCEDURE: Informed written consent was obtained from the patient after a discussion of the risks, benefits and alternatives to treatment. A timeout was performed prior to the initiation of the procedure. Initial ultrasound scanning demonstrates a large amount of ascites within the left lower abdominal quadrant. The left lower abdomen was prepped and draped in the usual sterile fashion. 1% lidocaine was used for local anesthesia. Following this, a 19 gauge, 7-cm, Yueh catheter was introduced. An ultrasound image was saved for documentation purposes. The paracentesis was performed. The catheter was removed and a dressing was applied. The patient tolerated the procedure well without immediate post procedural complication. FINDINGS: A total of approximately 2.2 L of blood-tinged fluid was removed. IMPRESSION: Successful ultrasound-guided paracentesis yielding 2.2 L liters of peritoneal fluid. Procedure performed by Loyce Dys, PA PLAN: If the patient eventually requires >/=2 paracenteses in a 30 day period, candidacy for formal evaluation by the [facility] will be assessed.  Electronically Signed   By: Corlis Leak M.D.   On: 10/26/2023 12:12   CT ABDOMEN PELVIS W CONTRAST Result Date: 10/25/2023 CLINICAL DATA:  Sepsis.  Abdominal pain. EXAM: CT ABDOMEN AND PELVIS WITH CONTRAST TECHNIQUE: Multidetector CT imaging of the abdomen and pelvis was performed using the standard protocol following bolus administration of intravenous contrast. RADIATION DOSE REDUCTION: This exam was performed according to the departmental dose-optimization program which includes automated exposure control, adjustment of the mA and/or kV according to patient size and/or use of iterative reconstruction technique. CONTRAST:  OMNIPAQUE IOHEXOL 300 MG/ML  SOLN COMPARISON:  12/05/2022 FINDINGS: Lower chest: Ground-glass opacities seen in the lung bases bilaterally suspicious for pneumonia. Enlarged subcarinal node partially visualized measuring up to 1.5 cm in short axis. Hepatobiliary: Nodular shrunken liver consistent with cirrhosis. These changes have progressed since prior CT  from 12/05/2022. No suspicious liver lesion is identified. The gallbladder is mildly distended but otherwise unremarkable. No bile duct dilatation is present. Pancreas: Unremarkable. No pancreatic ductal dilatation or surrounding inflammatory changes. Spleen: 1.8 cm low-density lesion within the spleen does not appear significantly changed in size dating back to 08/11/2022 which favors a benign etiology such as a cyst or hemangioma. Smaller adjacent lesion measuring 0.5 cm is also unchanged since 08/11/2022 and favored to be a benign etiology. Adrenals/Urinary Tract: Adrenal glands are normal. 1.2 cm simple cyst present in the lower pole of the right kidney does not require dedicated imaging surveillance. Additional subcentimeter right renal hypodensities are too small to fully characterize but also likely represent simple cysts and do not require dedicated imaging surveillance. No hydronephrosis or hydroureter. Evaluation of the  bladder is limited due to underdistention. There is grossly unremarkable. Stomach/Bowel: No significant abnormality of the stomach or doudenum. No bowel dilatation to indicate ileus or obstruction. The appendix is not identified Vascular/Lymphatic: Mild scattered atheromatous plaque of the abdominal aorta. Hepatic, portal, splenic, and superior mesenteric veins are patent. No enlarged abdominal or pelvic lymph nodes. Reproductive: Status post hysterectomy. No adnexal masses. Other: No abnormality of the abdominal wall. Moderate abdominal ascites. Musculoskeletal: Chronic bilateral L5 pars defects with grade 1 anterolisthesis of L5 on S1. No acute osseous abnormality. IMPRESSION: 1. Ground-glass opacities in the lung bases bilaterally suspicious for pneumonia. Mildly enlarged subcarinal lymph node is partially visualized measuring up to 1.5 cm most likely inflammatory, however incompletely evaluated on this examination. 2. Progressive cirrhosis with moderate abdominal ascites. Electronically Signed   By: Acquanetta Belling M.D.   On: 10/25/2023 07:36   DG Chest Port 1 View Result Date: 10/25/2023 CLINICAL DATA:  63 year old female with possible sepsis. EXAM: PORTABLE CHEST 1 VIEW COMPARISON:  Chest radiographs 12/03/2022 and earlier. FINDINGS: Portable AP semi upright view at 0534 hours. Mildly improved lung volumes. Stable borderline to mild cardiomegaly. Other mediastinal contours are within normal limits. Visualized tracheal air column is within normal limits. No pneumothorax, pleural effusion, consolidation. Mild chronic increased pulmonary interstitial opacity, with new patchy and asymmetric right greater than left opacity now. Paucity of bowel gas.  No acute osseous abnormality identified. IMPRESSION: Patchy, asymmetric perihilar lung opacity more resembles multilobar infection than asymmetric pulmonary edema. No consolidation or pleural effusion. Electronically Signed   By: Odessa Fleming M.D.   On: 10/25/2023 06:58      Subjective: Patient seen examined bedside, lying in bed.  Remains off of oxygen.  No desaturation while working with therapy yesterday.  States ready for discharge home as she wants to get to her pain management appointment.  No other specific questions, concerns or complaints at this time.  Denies headache, no dizziness, no chest pain, no palpitations, no shortness of breath, no abdominal pain, no fever/chills/night sweats, no nausea/vomiting/diarrhea, no focal weakness, no fatigue, no paresthesia.  No acute events overnight per nursing staff.  Discharge Exam: Vitals:   10/31/23 0508 10/31/23 0825  BP: (!) 148/66   Pulse: 73   Resp: 16   Temp: 97.7 F (36.5 C)   SpO2: 95% 96%   Vitals:   10/30/23 1930 10/30/23 1936 10/31/23 0508 10/31/23 0825  BP:  128/65 (!) 148/66   Pulse:  69 73   Resp:  18 16   Temp:  98.5 F (36.9 C) 97.7 F (36.5 C)   TempSrc:   Oral   SpO2: 100% 99% 95% 96%  Weight:      Height:  Physical Exam: GEN: NAD, alert and oriented x 3, obese, chronically ill in appearance HEENT: NCAT, PERRL, EOMI, sclera clear, MMM PULM: CTAB w/o wheezes/crackles, normal respiratory effort, on room air CV: RRR w/o M/G/R GI: abd soft, NTND, NABS, no R/G/M MSK: no peripheral edema, muscle strength globally intact 5/5 bilateral upper/lower extremities NEURO: CN II-XII intact, no focal deficits, sensation to light touch intact PSYCH: normal mood/affect Integumentary: dry/intact, no rashes or wounds    The results of significant diagnostics from this hospitalization (including imaging, microbiology, ancillary and laboratory) are listed below for reference.     Microbiology: Recent Results (from the past 240 hours)  Resp panel by RT-PCR (RSV, Flu A&B, Covid) Anterior Nasal Swab     Status: Abnormal   Collection Time: 10/25/23  4:13 AM   Specimen: Anterior Nasal Swab  Result Value Ref Range Status   SARS Coronavirus 2 by RT PCR POSITIVE (A) NEGATIVE Final     Comment: (NOTE) SARS-CoV-2 target nucleic acids are DETECTED.  The SARS-CoV-2 RNA is generally detectable in upper respiratory specimens during the acute phase of infection. Positive results are indicative of the presence of the identified virus, but do not rule out bacterial infection or co-infection with other pathogens not detected by the test. Clinical correlation with patient history and other diagnostic information is necessary to determine patient infection status. The expected result is Negative.  Fact Sheet for Patients: BloggerCourse.com  Fact Sheet for Healthcare Providers: SeriousBroker.it  This test is not yet approved or cleared by the [place] FDA and  has been authorized for detection and/or diagnosis of SARS-CoV-2 by FDA under an Emergency Use Authorization (EUA).  This EUA will remain in effect (meaning this test can be used) for the duration of  the COVID-19 declaration under Section 564(b)(1) of the A ct, 21 U.S.C. section 360bbb-3(b)(1), unless the authorization is terminated or revoked sooner.     Influenza A by PCR NEGATIVE NEGATIVE Final   Influenza B by PCR NEGATIVE NEGATIVE Final    Comment: (NOTE) The Xpert Xpress SARS-CoV-2/FLU/RSV plus assay is intended as an aid in the diagnosis of influenza from Nasopharyngeal swab specimens and should not be used as a sole basis for treatment. Nasal washings and aspirates are unacceptable for Xpert Xpress SARS-CoV-2/FLU/RSV testing.  Fact Sheet for Patients: BloggerCourse.com  Fact Sheet for Healthcare Providers: SeriousBroker.it  This test is not yet approved or cleared by the [place] FDA and has been authorized for detection and/or diagnosis of SARS-CoV-2 by FDA under an Emergency Use Authorization (EUA). This EUA will remain in effect (meaning this test can be used) for the duration of  the COVID-19 declaration under Section 564(b)(1) of the Act, 21 U.S.C. section 360bbb-3(b)(1), unless the authorization is terminated or revoked.     Resp Syncytial Virus by PCR NEGATIVE NEGATIVE Final    Comment: (NOTE) Fact Sheet for Patients: BloggerCourse.com  Fact Sheet for Healthcare Providers: SeriousBroker.it  This test is not yet approved or cleared by the [place] FDA and has been authorized for detection and/or diagnosis of SARS-CoV-2 by FDA under an Emergency Use Authorization (EUA). This EUA will remain in effect (meaning this test can be used) for the duration of the COVID-19 declaration under Section 564(b)(1) of the Act, 21 U.S.C. section 360bbb-3(b)(1), unless the authorization is terminated or revoked.  Performed at [facility], [address] [place], [place] [address]   Blood Culture (routine x 2)     Status: None   Collection Time:  10/25/23  4:54 AM   Specimen: BLOOD  Result Value Ref Range Status   Specimen Description   Final    BLOOD BLOOD RIGHT ARM Performed at [facility], [address] [place], [place] [address]    Special Requests   Final    Blood Culture adequate volume BOTTLES DRAWN AEROBIC AND ANAEROBIC Performed at [facility], [address] [place], [place] [address]    Culture   Final    NO GROWTH 5 DAYS Performed at [facility], [address], [place], [place] [address]    Report Status 10/30/2023 FINAL  Final  Blood Culture (routine x 2)     Status: None   Collection Time: 10/25/23  4:54 AM   Specimen: BLOOD  Result Value Ref Range Status   Specimen Description   Final    BLOOD LEFT ANTECUBITAL Performed at [facility], [address] [place], [place] [address]    Special Requests   Final    Blood Culture adequate volume BOTTLES DRAWN AEROBIC AND ANAEROBIC Performed at [facility], [address], [place], [place] [address]    Culture   Final    NO GROWTH 5 DAYS Performed at [facility], [address] [place], [place] [address]    Report Status 10/30/2023 FINAL  Final  Body fluid culture w Gram Stain     Status: None   Collection Time: 10/25/23  8:19 AM   Specimen: Peritoneal Cavity; Peritoneal Fluid  Result Value Ref Range Status   Specimen Description   Final    PERITONEAL CAVITY Performed at [facility], [address], [place], [place] [address]    Special Requests   Final    NONE Performed at [facility], [address] [place], [place] [address]    Gram Stain NO WBC SEEN NO ORGANISMS SEEN   Final   Culture   Final    NO GROWTH 3 DAYS Performed at [facility], [address] [place], [place] [address]    Report Status 10/28/2023 FINAL  Final     Labs: BNP (last 3 results) Recent Labs    11/05/22 0630 12/03/22 2053  BNP 30.1 74.2   Basic Metabolic Panel: Recent Labs  Lab 10/25/23 0451 10/25/23 0454 10/27/23 0531 10/28/23 0534 10/29/23 0516 10/30/23 0538 10/31/23 0544  NA  --    < > 136 135 136 137 134*  K  --    < > 3.5 3.0* 3.3* 4.3 4.0  CL  --    < > 94* 90* 89* 93* 94*  CO2  --    < > 36* 37* 35* 33* 32  GLUCOSE  --    < > 128* 74 244* 175* 344*  BUN  --    < > 11 11 13 15 18   CREATININE  --    < > 0.86 0.69 0.72 0.63 0.65  CALCIUM  --    < > 7.8* 7.7* 7.7* 8.4* 8.0*  MG 1.9  --   --   --   --   --   --   PHOS 2.1*  --   --   --   --   --   --    < > = values in this interval not displayed.   Liver Function Tests: Recent Labs  Lab 10/27/23 0531 10/28/23 0534 10/29/23 0516 10/30/23 0538 10/31/23 0544  AST  28 32 40 41 147*  ALT 13 16 21 22  113*  ALKPHOS 59 64 64 63 107  BILITOT 0.7 0.8 0.8 0.5 0.4  PROT 7.1 7.7 6.9 6.9 7.1  ALBUMIN 2.8* 2.9* 2.7* 2.7* 2.6*   Recent Labs  Lab 10/25/23 0454  LIPASE 33   No results for input(s):  "AMMONIA" in the last 168 hours. CBC: Recent Labs  Lab 10/25/23 0454 10/26/23 0503 10/27/23 0531 10/28/23 0534 10/29/23 0516 10/30/23 0538 10/31/23 0544  WBC 3.7*   < > 5.2 5.2 3.2* 6.6 6.7  NEUTROABS 2.5  --   --   --   --   --   --   HGB 10.8*   < > 9.7* 11.0* 10.2* 10.8* 10.6*  HCT 36.1   < > 33.9* 37.1 35.7* 36.5 36.2  MCV 85.3   < > 87.4 87.3 87.3 85.9 85.6  PLT 125*   < > 104* 121* 123* 148* 165   < > = values in this interval not displayed.   Cardiac Enzymes: No results for input(s): "CKTOTAL", "CKMB", "CKMBINDEX", "TROPONINI" in the last 168 hours. BNP: Invalid input(s): "POCBNP" CBG: Recent Labs  Lab 10/30/23 0732 10/30/23 1157 10/30/23 1623 10/30/23 1934 10/31/23 0742  GLUCAP 200* 143* 242* 240* 219*   D-Dimer No results for input(s): "DDIMER" in the last 72 hours. Hgb A1c No results for input(s): "HGBA1C" in the last 72 hours. Lipid Profile No results for input(s): "CHOL", "HDL", "LDLCALC", "TRIG", "CHOLHDL", "LDLDIRECT" in the last 72 hours. Thyroid function studies No results for input(s): "TSH", "T4TOTAL", "T3FREE", "THYROIDAB" in the last 72 hours.  Invalid input(s): "FREET3" Anemia work up No results for input(s): "VITAMINB12", "FOLATE", "FERRITIN", "TIBC", "IRON", "RETICCTPCT" in the last 72 hours. Urinalysis    Component Value Date/Time   COLORURINE YELLOW 10/25/2023 0412   APPEARANCEUR HAZY (A) 10/25/2023 0412   LABSPEC 1.006 10/25/2023 0412   PHURINE 6.0 10/25/2023 0412   GLUCOSEU NEGATIVE 10/25/2023 0412   HGBUR NEGATIVE 10/25/2023 0412   BILIRUBINUR NEGATIVE 10/25/2023 0412   KETONESUR NEGATIVE 10/25/2023 0412   PROTEINUR NEGATIVE 10/25/2023 0412   UROBILINOGEN 0.2 12/09/2014 0840   NITRITE NEGATIVE 10/25/2023 0412   LEUKOCYTESUR NEGATIVE 10/25/2023 0412   Sepsis Labs Recent Labs  Lab 10/28/23 0534 10/29/23 0516 10/30/23 0538 10/31/23 0544  WBC 5.2 3.2* 6.6 6.7   Microbiology Recent Results (from the past 240 hours)  Resp  panel by RT-PCR (RSV, Flu A&B, Covid) Anterior Nasal Swab     Status: Abnormal   Collection Time: 10/25/23  4:13 AM   Specimen: Anterior Nasal Swab  Result Value Ref Range Status   SARS Coronavirus 2 by RT PCR POSITIVE (A) NEGATIVE Final    Comment: (NOTE) SARS-CoV-2 target nucleic acids are DETECTED.  The SARS-CoV-2 RNA is generally detectable in upper respiratory specimens during the acute phase of infection. Positive results are indicative of the presence of the identified virus, but do not rule out bacterial infection or co-infection with other pathogens not detected by the test. Clinical correlation with patient history and other diagnostic information is necessary to determine patient infection status. The expected result is Negative.  Fact Sheet for Patients: BloggerCourse.com  Fact Sheet for Healthcare Providers: SeriousBroker.it  This test is not yet approved or cleared by the [place] FDA and  has been authorized for detection and/or diagnosis of SARS-CoV-2 by FDA under an Emergency Use Authorization (EUA).  This EUA will remain in effect (meaning this test can be used) for the duration  of  the COVID-19 declaration under Section 564(b)(1) of the A ct, 21 U.S.C. section 360bbb-3(b)(1), unless the authorization is terminated or revoked sooner.     Influenza A by PCR NEGATIVE NEGATIVE Final   Influenza B by PCR NEGATIVE NEGATIVE Final    Comment: (NOTE) The Xpert Xpress SARS-CoV-2/FLU/RSV plus assay is intended as an aid in the diagnosis of influenza from Nasopharyngeal swab specimens and should not be used as a sole basis for treatment. Nasal washings and aspirates are unacceptable for Xpert Xpress SARS-CoV-2/FLU/RSV testing.  Fact Sheet for Patients: BloggerCourse.com  Fact Sheet for Healthcare Providers: SeriousBroker.it  This test is not yet approved or  cleared by the [place] FDA and has been authorized for detection and/or diagnosis of SARS-CoV-2 by FDA under an Emergency Use Authorization (EUA). This EUA will remain in effect (meaning this test can be used) for the duration of the COVID-19 declaration under Section 564(b)(1) of the Act, 21 U.S.C. section 360bbb-3(b)(1), unless the authorization is terminated or revoked.     Resp Syncytial Virus by PCR NEGATIVE NEGATIVE Final    Comment: (NOTE) Fact Sheet for Patients: BloggerCourse.com  Fact Sheet for Healthcare Providers: SeriousBroker.it  This test is not yet approved or cleared by the [place] FDA and has been authorized for detection and/or diagnosis of SARS-CoV-2 by FDA under an Emergency Use Authorization (EUA). This EUA will remain in effect (meaning this test can be used) for the duration of the COVID-19 declaration under Section 564(b)(1) of the Act, 21 U.S.C. section 360bbb-3(b)(1), unless the authorization is terminated or revoked.  Performed at [facility], [address], [place], [place] [address]   Blood Culture (routine x 2)     Status: None   Collection Time: 10/25/23  4:54 AM   Specimen: BLOOD  Result Value Ref Range Status   Specimen Description   Final    BLOOD BLOOD RIGHT ARM Performed at [facility], [address], [place], [place] [address]    Special Requests   Final    Blood Culture adequate volume BOTTLES DRAWN AEROBIC AND ANAEROBIC Performed at [facility], [address], [place], [place] [address]    Culture   Final    NO GROWTH 5 DAYS Performed at [facility], [address], [place], [place] [address]    Report Status 10/30/2023 FINAL  Final  Blood Culture (routine x 2)     Status: None   Collection Time: 10/25/23  4:54 AM   Specimen: BLOOD  Result Value Ref Range Status   Specimen Description   Final     BLOOD LEFT ANTECUBITAL Performed at [facility], [address], [place], [place] [address]    Special Requests   Final    Blood Culture adequate volume BOTTLES DRAWN AEROBIC AND ANAEROBIC Performed at [facility], [address] [place], [place] [address]    Culture   Final    NO GROWTH 5 DAYS Performed at [facility], [address], [place], [place] [address]    Report Status 10/30/2023 FINAL  Final  Body fluid culture w Gram Stain     Status: None   Collection Time: 10/25/23  8:19 AM   Specimen: Peritoneal Cavity; Peritoneal Fluid  Result Value Ref Range Status   Specimen Description   Final    PERITONEAL CAVITY Performed at [facility], [address], [place], [place] [address]    Special  Requests   Final    NONE Performed at [facility], [address] [place], [place] [address]    Gram Stain NO WBC SEEN NO ORGANISMS SEEN   Final   Culture   Final    NO GROWTH 3 DAYS Performed at [facility], [address], [place], [place] [address]    Report Status 10/28/2023 FINAL  Final     Time coordinating discharge: Over 30 minutes  SIGNED:   Alvira Philips [place], DO  [facility] 10/31/2023, 9:54 AM

## 2023-10-31 NOTE — TOC Transition Note (Signed)
Transition of Care [facility]) - Discharge Note   Patient Details  Name: [name] MRN: [id number] Date of Birth: 1961/04/10  Transition of Care [facility]) CM/SW Contact:  Adrian Prows, RN Phone Number: 10/31/2023, 10:23 AM   Clinical Narrative:    D/C orders received; no TOC needs.   Final next level of care: Home/Self Care Barriers to Discharge: No Barriers Identified   Patient Goals and CMS Choice Patient states their goals for this hospitalization and ongoing recovery are:: Home CMS Medicare.gov Compare Post Acute Care list provided to:: Patient Choice offered to / list presented to : Patient [place] ownership interest in [facility]provided to:: Patient    Discharge Placement                       Discharge Plan and Services Additional resources added to the After Visit Summary for     Discharge Planning Services: CM Consult                                 Social Drivers of Health (SDOH) Interventions SDOH Screenings   Food Insecurity: No Food Insecurity (10/26/2023)  Housing: Low Risk  (10/26/2023)  Transportation Needs: Patient Declined (10/25/2023)  Utilities: Not At Risk (10/25/2023)  Social Connections: Unknown (01/27/2022)   Received from [facility]  Tobacco Use: High Risk (10/25/2023)     Readmission Risk Interventions    10/27/2023    2:23 PM  Readmission Risk Prevention Plan  Transportation Screening Complete  Medication Review (RN Care Manager) Complete  PCP or Specialist appointment within 3-5 days of discharge Complete  HRI or Home Care Consult Complete  SW Recovery Care/Counseling Consult Complete  Palliative Care Screening Not Applicable  Skilled Nursing Facility Not Applicable

## 2023-11-02 ENCOUNTER — Telehealth: Payer: Self-pay

## 2023-11-02 NOTE — Transitions of Care (Post Inpatient/ED Visit) (Signed)
11/02/2023  Name: [name] MRN: [id number] DOB: Jan 07, 1961  Today's TOC FU Call Status: Today's TOC FU Call Status:: Unsuccessful Call (1st Attempt) Unsuccessful Call (1st Attempt) Date: 11/02/23 Patient's Name and Date of Birth confirmed.  Transition Care Management Follow-up Telephone Call Date of Discharge: 10/31/23 Discharge Facility: [facility]) Type of Discharge: Inpatient Admission Primary Inpatient Discharge Diagnosis:: COVID, Pneumonia How have you been since you were released from the hospital?: Same (patient states she is exhausted & needs to rest - will call her Wednesday to finish TOC FU. Patient states she does not feel worse but has requested more neb treatment refills from MD and also is reaching out to MD for expectorant as well.) Any questions or concerns?: Yes Patient Questions/Concerns:: Patient asked about getting RX for an expectorant like the one she was getting in the hospital prior to her discharge. I found no expectorant on DC Med list. Referred patient to her PCP to request.  Items Reviewed: Did you receive and understand the discharge instructions provided?: Yes Medications obtained,verified, and reconciled?: Yes (Medications Reviewed) Any new allergies since your discharge?: No  Medications Reviewed Today: Medications Reviewed Today     Reviewed by Marcos Eke, RN (Registered Nurse) on 11/02/23 at 1512  Med List Status: <None>   Medication Order Taking? Sig Documenting Provider Last Dose Status Informant  acetaminophen (TYLENOL) 500 MG tablet [id number]  Take 500-1,000 mg by mouth every 6 (six) hours as needed for mild pain (pain score 1-3) or headache. [provider]  Active Self  albuterol (PROVENTIL HFA;VENTOLIN HFA) 108 (90 BASE) MCG/ACT inhaler [id number] No Inhale 2 puffs into the lungs every 6 (six) hours as needed for wheezing or shortness of breath. [provider] Past Month Active Self  albuterol (PROVENTIL) (2.5  MG/3ML) 0.083% nebulizer solution [id number] No Take 2.5 mg by nebulization every 6 (six) hours as needed for wheezing or shortness of breath. [provider] More than a month Active Self  ALPRAZolam (XANAX) 1 MG tablet [id number] No Take 1-2 mg by mouth See admin instructions. Take 2 mg by mouth at bedtime and an additional 1 mg once a day as needed for anxiety [provider] 10/22/2023 Active Self           Med Note Luster Landsberg, TIFFANI S   Sat Apr 30, 2019  7:51 PM)    amLODipine (NORVASC) 5 MG tablet [id number] No Take 5 mg by mouth daily. [provider] 10/23/2023 Active Self  baclofen (LIORESAL) 10 MG tablet [id number]  Take 1 tablet (10 mg total) by mouth 3 (three) times daily.  Patient taking differently: Take 10 mg by mouth 3 (three) times daily as needed for muscle spasms.   Rolly Salter, MD  Active Self  benazepril (LOTENSIN) 40 MG tablet [id number] No Take 40 mg by mouth daily. [provider] 10/23/2023 Active Self  Benzocaine-Resorcinol (VAGISIL EX) [id number]  Apply 1 application topically daily. [provider]  Active Self  bisacodyl (DULCOLAX) 10 MG suppository [id number] No Place 1 suppository (10 mg total) rectally daily as needed for moderate constipation. Rolly Salter, MD Not Taking Active Self  budesonide (PULMICORT) 1 MG/2ML nebulizer solution [id number] No Take 1 mg by nebulization daily as needed (for shortness of breath- when sick). [provider] More than a month Active Self  cloNIDine (CATAPRES) 0.2 MG tablet [id number] No Take 0.2 mg by mouth daily. [provider] 10/23/2023 Active Self  cyclobenzaprine (FLEXERIL) 10 MG tablet [id number] No  Take 10 mg by mouth 3 (three) times daily as needed for muscle spasms. [provider] 02/02/2023 Active Self  escitalopram (LEXAPRO) 20 MG tablet [id number] No Take 20 mg by mouth daily. [provider] 10/23/2023 Active Self  estradiol (ESTRACE) 2 MG tablet [id number]  No Take 2 mg by mouth daily.  [provider] 10/23/2023 Active Self  fluticasone (FLONASE) 50 MCG/ACT nasal spray [id number] No Place 1 spray into both nostrils daily as needed for allergies. [provider] Past Month Active Self  furosemide (LASIX) 40 MG tablet [id number] No Take 1 tablet (40 mg total) by mouth daily. Take additional 40 mg when you have a weight gain of 3 lbs in 1 day or 5 lbs in 1 week.  Patient taking differently: Take 40 mg by mouth See admin instructions. Take 40 mg by mouth once a day and an additional 40 with a weight gain of 3 pounds/day or 5 pounds/week   Pattijo, Juste, MD 10/15/2023 Active Self  Glycerin-Polysorbate 80 (REFRESH DRY EYE THERAPY OP) [id number] No Place 1 drop into both eyes 3 (three) times daily as needed (for dryness). [provider] Past Week Active Self  glycopyrrolate (ROBINUL) 1 MG tablet [id number]  Take 1-2 mg by mouth daily as needed (for excessive saliva). [provider]  Active Self  lactulose (CHRONULAC) 10 GM/15ML solution [id number] No Take 15 mLs by mouth 2 (two) times daily as needed for mild constipation. [provider] More than a month Active Self  levocetirizine (XYZAL) 5 MG tablet [id number] No Take 5 mg by mouth every evening.  [provider] 10/23/2023 Active Self  lidocaine (LIDODERM) 5 % [id number]  Place 1 patch onto the skin daily. Remove & Discard patch within 12 hours or as directed by MD  Patient taking differently: Place 1 patch onto the skin daily as needed (pain). Remove & Discard patch within 12 hours or as directed by MD   Tegeler, Canary Brim, MD  Active Self           Med Note Antony Madura, Arn Medal   Sun Oct 25, 2023  4:41 PM) NO patches reported "on" at this time  lidocaine (XYLOCAINE) 5 % ointment [id number] No Apply 1 application topically 2 (two) times daily as needed (hemorrhoids). [provider] Past Month Active Self           Med Note Laray Anger Dec 04, 2022  8:31 AM)    meloxicam (MOBIC) 15 MG tablet [id number] No Take 15 mg by mouth daily. [provider] 10/23/2023 Active Self  mupirocin ointment (BACTROBAN) 2 % [id number] No Apply 1 Application topically 2 (two) times daily as needed ("for sores in the nose"). [provider] Past Month Active Self  naloxone (NARCAN) 4 MG/0.1ML LIQD nasal spray kit [id number]  Place 1 spray into the nose as needed (for overdose).  [provider]  Active Self           Med Note Laray Anger Dec 04, 2022  8:31 AM)    nystatin (MYCOSTATIN/NYSTOP) powder [id number] No Apply 1 g topically 4 (four) times daily as needed (irritation). [provider] Past Month Active Self           Med Note Laray Anger Dec 04, 2022  8:31 AM)    ondansetron (ZOFRAN) 8 MG tablet [id number] No Take 8 mg by mouth every 8 (eight) hours as needed for nausea  or vomiting. [provider] 10/25/2023 Morning Active Self  oxyCODONE (ROXICODONE) 15 MG immediate release tablet [id number] No Take 1 tablet (15 mg total) by mouth every 8 (eight) hours as needed for pain.  Patient not taking: Reported on 10/25/2023   Hollice Espy, MD Not Taking Active Self           Med Note Raynelle Chary   Thu Dec 11, 2022  3:49 PM)    Oxycodone HCl 20 MG TABS [id number] No Take 20 mg by mouth See admin instructions. Take 20 mg by mouth every 4 hours- max of 5 tablets/24 hours [provider] 10/24/2023 Active Self  OZEMPIC, 1 MG/DOSE, 4 MG/3ML SOPN [id number] No Inject 1 mg into the skin every Monday. [provider] 10/19/2023 Active Multiple Informants           Med Note Antony Madura, Dyane Dustman Oct 25, 2023  4:29 PM) I actually had to CALL [facility] to confirm the dose  pantoprazole (PROTONIX) 40 MG tablet [id number] No Take 40 mg by mouth 2 (two) times daily before a meal. [provider] 10/23/2023 Active Self  PHAZYME MAXIMUM STRENGTH 250 MG CAPS [id number]  Take 250 mg  by mouth 2 (two) times daily as needed (for gas). [provider]  Active Self  polyethylene glycol (MIRALAX / GLYCOLAX) 17 g packet [id number]  Take 17 g by mouth 2 (two) times daily.  Patient taking differently: Take 17 g by mouth daily as needed for moderate constipation (AND HOLD FOR DIARRHEA- mix as directed).   Rolly Salter, MD  Active Self  predniSONE (DELTASONE) 10 MG tablet [id number]  Take 4 tablets (40 mg total) by mouth daily for 2 days, THEN 3 tablets (30 mg total) daily for 2 days, THEN 2 tablets (20 mg total) daily for 2 days, THEN 1 tablet (10 mg total) daily for 2 days. [place], Alvira Philips, DO  Active   prochlorperazine (COMPAZINE) 10 MG tablet [id number] No Take 10 mg by mouth every 6 (six) hours as needed for nausea or vomiting (not relieved by Zofran). [provider] Past Month Active Self  promethazine (PHENERGAN) 25 MG tablet [id number] No Take 25 mg by mouth every 8 (eight) hours as needed for nausea or vomiting. [provider] Past Month Active Self  rizatriptan (MAXALT-MLT) 10 MG disintegrating tablet [id number] No Take 10 mg by mouth as needed for migraine (may repeat once in 2 hours, if no relief- dissolve orally). [provider] Past Month Active Self           Med Note Gunnar Fusi, MELISSA R   Thu Dec 11, 2022  3:51 PM)    Simethicone 80 MG TABS [id number] No Take 1 tablet (80 mg total) by mouth in the morning, at noon, in the evening, and at bedtime.  Patient not taking: Reported on 10/25/2023   Rolly Salter, MD Not Taking Active Self  simvastatin (ZOCOR) 20 MG tablet [id number] No Take 20 mg by mouth at bedtime. [provider] 10/23/2023 Active Self  topiramate (TOPAMAX) 50 MG tablet [id number] No Take 50 mg by mouth daily as needed (migraines). [provider] Past Month Active Self           Med Note Penni Bombard   Wed Nov 05, 2022 11:09 AM)    traZODone (DESYREL) 50 MG tablet [id number] No Take 100 mg by mouth at  bedtime. [provider] 10/23/2023 Active Self  triamcinolone cream (KENALOG) 0.1 %  [id number] No Apply 1 application  topically 2 (two) times daily as needed (for rashes). [provider] Past Month Active Self            Home Care and Equipment/Supplies: Were Home Health Services Ordered?: NA Any new equipment or medical supplies ordered?: NA  Functional Questionnaire:    Follow up appointments reviewed: Do you understand care options if your condition(s) worsen?: Yes-patient verbalized understanding  Incomplete Transition of Care Follow Up due to patient's request for continuance another day as patient expressed need to return to sleeping due to exhaustion. Will call patient back on Wednesday, 11/04/23.     Alyse Low, RN, BA, [facility], CRRN [facility] Coordinator, Transition of Care Ph # [phone]

## 2023-11-04 ENCOUNTER — Telehealth: Payer: Self-pay

## 2023-11-04 ENCOUNTER — Other Ambulatory Visit: Payer: Self-pay

## 2023-11-04 NOTE — Patient Outreach (Signed)
  Care Management  Transitions of Care Program Managed Medicaid Transitions of Care Follow Up call Week 1   11/04/2023 Name: [name] MRN: [id number] DOB: [birth year]  Subjective: [name] is a 63 y.o. year old female who is a primary care patient of Noberto Retort, MD. The Care Management team was unable to reach the patient by phone to assess and address transitions of care needs.   Plan: Additional outreach attempts will be made to reach the patient enrolled in the [facility] Program (Post Inpatient/ED Visit).  Christalyn Goertz A. Mliss Fritz RN, BA, [facility], CRRN [facility] RN Care Manager, Transition of Care [phone]

## 2023-11-07 DIAGNOSIS — J9621 Acute and chronic respiratory failure with hypoxia: Secondary | ICD-10-CM | POA: Diagnosis not present

## 2023-11-07 DIAGNOSIS — J9622 Acute and chronic respiratory failure with hypercapnia: Secondary | ICD-10-CM | POA: Diagnosis not present

## 2023-11-07 DIAGNOSIS — I2510 Atherosclerotic heart disease of native coronary artery without angina pectoris: Secondary | ICD-10-CM | POA: Diagnosis not present

## 2023-11-07 DIAGNOSIS — J9611 Chronic respiratory failure with hypoxia: Secondary | ICD-10-CM | POA: Diagnosis not present

## 2023-11-10 DIAGNOSIS — M7072 Other bursitis of hip, left hip: Secondary | ICD-10-CM | POA: Diagnosis not present

## 2023-11-13 DIAGNOSIS — K219 Gastro-esophageal reflux disease without esophagitis: Secondary | ICD-10-CM | POA: Diagnosis not present

## 2023-11-13 DIAGNOSIS — G894 Chronic pain syndrome: Secondary | ICD-10-CM | POA: Diagnosis not present

## 2023-11-13 DIAGNOSIS — J449 Chronic obstructive pulmonary disease, unspecified: Secondary | ICD-10-CM | POA: Diagnosis not present

## 2023-11-13 DIAGNOSIS — I2510 Atherosclerotic heart disease of native coronary artery without angina pectoris: Secondary | ICD-10-CM | POA: Diagnosis not present

## 2023-11-13 DIAGNOSIS — R110 Nausea: Secondary | ICD-10-CM | POA: Diagnosis not present

## 2023-11-13 DIAGNOSIS — K7460 Unspecified cirrhosis of liver: Secondary | ICD-10-CM | POA: Diagnosis not present

## 2023-11-23 ENCOUNTER — Other Ambulatory Visit (HOSPITAL_COMMUNITY): Payer: Self-pay | Admitting: Gastroenterology

## 2023-11-23 DIAGNOSIS — R1084 Generalized abdominal pain: Secondary | ICD-10-CM | POA: Diagnosis not present

## 2023-11-23 DIAGNOSIS — R188 Other ascites: Secondary | ICD-10-CM | POA: Diagnosis not present

## 2023-11-23 DIAGNOSIS — K7460 Unspecified cirrhosis of liver: Secondary | ICD-10-CM | POA: Diagnosis not present

## 2023-11-26 DIAGNOSIS — M7072 Other bursitis of hip, left hip: Secondary | ICD-10-CM | POA: Diagnosis not present

## 2023-11-26 DIAGNOSIS — M7158 Other bursitis, not elsewhere classified, other site: Secondary | ICD-10-CM | POA: Diagnosis not present

## 2023-11-27 DIAGNOSIS — M542 Cervicalgia: Secondary | ICD-10-CM | POA: Diagnosis not present

## 2023-11-27 DIAGNOSIS — K7460 Unspecified cirrhosis of liver: Secondary | ICD-10-CM | POA: Diagnosis not present

## 2023-11-27 DIAGNOSIS — M2550 Pain in unspecified joint: Secondary | ICD-10-CM | POA: Diagnosis not present

## 2023-11-27 DIAGNOSIS — M545 Low back pain, unspecified: Secondary | ICD-10-CM | POA: Diagnosis not present

## 2023-11-27 DIAGNOSIS — J449 Chronic obstructive pulmonary disease, unspecified: Secondary | ICD-10-CM | POA: Diagnosis not present

## 2023-11-27 DIAGNOSIS — Z79899 Other long term (current) drug therapy: Secondary | ICD-10-CM | POA: Diagnosis not present

## 2023-11-27 DIAGNOSIS — G8929 Other chronic pain: Secondary | ICD-10-CM | POA: Diagnosis not present

## 2023-11-27 DIAGNOSIS — R109 Unspecified abdominal pain: Secondary | ICD-10-CM | POA: Diagnosis not present

## 2023-11-28 ENCOUNTER — Emergency Department (HOSPITAL_BASED_OUTPATIENT_CLINIC_OR_DEPARTMENT_OTHER)
Admission: EM | Admit: 2023-11-28 | Discharge: 2023-11-29 | Disposition: A | Discharge status: Home / Self Care | Attending: Emergency Medicine | Admitting: Emergency Medicine

## 2023-11-28 ENCOUNTER — Other Ambulatory Visit: Payer: Self-pay

## 2023-11-28 ENCOUNTER — Encounter (HOSPITAL_BASED_OUTPATIENT_CLINIC_OR_DEPARTMENT_OTHER): Payer: Self-pay

## 2023-11-28 ENCOUNTER — Emergency Department (HOSPITAL_BASED_OUTPATIENT_CLINIC_OR_DEPARTMENT_OTHER)

## 2023-11-28 DIAGNOSIS — E119 Type 2 diabetes mellitus without complications: Secondary | ICD-10-CM | POA: Insufficient documentation

## 2023-11-28 DIAGNOSIS — I2510 Atherosclerotic heart disease of native coronary artery without angina pectoris: Secondary | ICD-10-CM | POA: Insufficient documentation

## 2023-11-28 DIAGNOSIS — I10 Essential (primary) hypertension: Secondary | ICD-10-CM | POA: Diagnosis not present

## 2023-11-28 DIAGNOSIS — J449 Chronic obstructive pulmonary disease, unspecified: Secondary | ICD-10-CM | POA: Diagnosis not present

## 2023-11-28 DIAGNOSIS — G8929 Other chronic pain: Secondary | ICD-10-CM | POA: Insufficient documentation

## 2023-11-28 DIAGNOSIS — R1084 Generalized abdominal pain: Secondary | ICD-10-CM | POA: Insufficient documentation

## 2023-11-28 DIAGNOSIS — R109 Unspecified abdominal pain: Secondary | ICD-10-CM | POA: Diagnosis present

## 2023-11-28 DIAGNOSIS — J45909 Unspecified asthma, uncomplicated: Secondary | ICD-10-CM | POA: Diagnosis not present

## 2023-11-28 LAB — URINALYSIS, ROUTINE W REFLEX MICROSCOPIC
Bilirubin Urine: NEGATIVE
Glucose, UA: NEGATIVE mg/dL
Hgb urine dipstick: NEGATIVE
Ketones, ur: NEGATIVE mg/dL
Leukocytes,Ua: NEGATIVE
Nitrite: NEGATIVE
Protein, ur: NEGATIVE mg/dL
Specific Gravity, Urine: 1.011 (ref 1.005–1.030)
pH: 6 (ref 5.0–8.0)

## 2023-11-28 LAB — COMPREHENSIVE METABOLIC PANEL
ALT: 8 U/L (ref 0–44)
AST: 15 U/L (ref 15–41)
Albumin: 3.6 g/dL (ref 3.5–5.0)
Alkaline Phosphatase: 64 U/L (ref 38–126)
Anion gap: 3 — ABNORMAL LOW (ref 5–15)
BUN: 10 mg/dL (ref 8–23)
CO2: 29 mmol/L (ref 22–32)
Calcium: 8.6 mg/dL — ABNORMAL LOW (ref 8.9–10.3)
Chloride: 103 mmol/L (ref 98–111)
Creatinine, Ser: 0.73 mg/dL (ref 0.44–1.00)
GFR, Estimated: >60 mL/min (ref >60)
Glucose, Bld: 119 mg/dL — ABNORMAL HIGH (ref 70–99)
Potassium: 3.6 mmol/L (ref 3.5–5.1)
Sodium: 135 mmol/L (ref 135–145)
Total Bilirubin: 0.5 mg/dL (ref 0.0–1.2)
Total Protein: 7.7 g/dL (ref 6.5–8.1)

## 2023-11-28 LAB — LIPASE, BLOOD: Lipase: 38 U/L (ref 11–51)

## 2023-11-28 LAB — CBC
HCT: 35.5 % — ABNORMAL LOW (ref 36.0–46.0)
Hemoglobin: 10.8 g/dL — ABNORMAL LOW (ref 12.0–15.0)
MCH: 24.7 pg — ABNORMAL LOW (ref 26.0–34.0)
MCHC: 30.4 g/dL (ref 30.0–36.0)
MCV: 81.2 fL (ref 80.0–100.0)
Platelets: 188 10*3/uL (ref 150–400)
RBC: 4.37 MIL/uL (ref 3.87–5.11)
RDW: 15.4 % (ref 11.5–15.5)
WBC: 6.8 10*3/uL (ref 4.0–10.5)
nRBC: 0 % (ref 0.0–0.2)

## 2023-11-28 MED ORDER — MORPHINE SULFATE (PF) 4 MG/ML IV SOLN
4.0000 mg | Freq: Once | INTRAVENOUS | Status: AC
  Administered 2023-11-28: 4 mg via INTRAVENOUS
  Filled 2023-11-28: qty 1

## 2023-11-28 MED ORDER — ONDANSETRON HCL 4 MG/2ML IJ SOLN
4.0000 mg | Freq: Once | INTRAMUSCULAR | Status: AC
  Administered 2023-11-28: 4 mg via INTRAVENOUS
  Filled 2023-11-28: qty 2

## 2023-11-28 MED ORDER — IOHEXOL 300 MG/ML  SOLN
100.0000 mL | Freq: Once | INTRAMUSCULAR | Status: AC | PRN
  Administered 2023-11-28: 100 mL via INTRAVENOUS

## 2023-11-28 NOTE — ED Triage Notes (Signed)
 Reports "extreme, severe pain" in her abdomen. Rates pain 9/10 in the center of the abdomen. Patient takes oxycodone IR (20mg / 5 times a day) and xtampzay ER (9mg  q12 hours). Patient also had an injection Thursday for pain. Patient woke up with nausea and vomiting today. States her GI Dr advised her to come to the ER. Hx of cirrhosis, and bursitis. Denies bloody stool. Vomited twice today. Took phenergan, Zofran and prochlorperazine throughout the day with no relief. Denies blood thinners.

## 2023-11-29 DIAGNOSIS — N281 Cyst of kidney, acquired: Secondary | ICD-10-CM | POA: Diagnosis not present

## 2023-11-29 DIAGNOSIS — R1084 Generalized abdominal pain: Secondary | ICD-10-CM | POA: Diagnosis not present

## 2023-11-29 DIAGNOSIS — K7460 Unspecified cirrhosis of liver: Secondary | ICD-10-CM | POA: Diagnosis not present

## 2023-11-29 DIAGNOSIS — R109 Unspecified abdominal pain: Secondary | ICD-10-CM | POA: Diagnosis not present

## 2023-11-29 MED ORDER — OXYCODONE HCL 5 MG PO TABS
20.0000 mg | ORAL_TABLET | Freq: Once | ORAL | Status: AC
  Administered 2023-11-29: 20 mg via ORAL
  Filled 2023-11-29: qty 4

## 2023-11-29 NOTE — ED Provider Notes (Signed)
 [facility] AT [facility] Provider Note   CSN: [id number] Arrival date & time: 11/28/23  2202     History  Chief Complaint  Patient presents with   Abdominal Pain    [name] is a 63 y.o. female.  HPI     This is a 63 year old female who presents with abdominal pain.  History of chronic abdominal pain, cirrhosis.  She had medications adjusted on Monday.  She states that she recently has had an increase in abdominal pain and is due to have a CT scan of the abdomen per her gastroenterologist.  She reports nausea and vomiting.  Normal bowel movements.  Denies bloody stools.  She has taken her home oxycodone, xtampzay, Phenergan, Zofran, and Reglan.  Denies fevers or urinary symptoms.  Home Medications Prior to Admission medications   Medication Sig Start Date End Date Taking? Authorizing Provider  acetaminophen (TYLENOL) 500 MG tablet Take 500-1,000 mg by mouth every 6 (six) hours as needed for mild pain (pain score 1-3) or headache.    [provider]  albuterol (PROVENTIL HFA;VENTOLIN HFA) 108 (90 BASE) MCG/ACT inhaler Inhale 2 puffs into the lungs every 6 (six) hours as needed for wheezing or shortness of breath.    [provider]  albuterol (PROVENTIL) (2.5 MG/3ML) 0.083% nebulizer solution Take 2.5 mg by nebulization every 6 (six) hours as needed for wheezing or shortness of breath.    [provider]  ALPRAZolam Prudy Feeler) 1 MG tablet Take 1-2 mg by mouth See admin instructions. Take 2 mg by mouth at bedtime and an additional 1 mg once a day as needed for anxiety    [provider]  amLODipine (NORVASC) 5 MG tablet Take 5 mg by mouth daily.    [provider]  baclofen (LIORESAL) 10 MG tablet Take 1 tablet (10 mg total) by mouth 3 (three) times daily. Patient taking differently: Take 10 mg by mouth 3 (three) times daily as needed for muscle spasms. 12/07/22   Rolly Salter, MD  benazepril (LOTENSIN) 40  MG tablet Take 40 mg by mouth daily.    [provider]  Benzocaine-Resorcinol (VAGISIL EX) Apply 1 application topically daily.    [provider]  bisacodyl (DULCOLAX) 10 MG suppository Place 1 suppository (10 mg total) rectally daily as needed for moderate constipation. 12/07/22   Rolly Salter, MD  budesonide (PULMICORT) 1 MG/2ML nebulizer solution Take 1 mg by nebulization daily as needed (for shortness of breath- when sick). 04/08/21   [provider]  cloNIDine (CATAPRES) 0.2 MG tablet Take 0.2 mg by mouth daily.    [provider]  cyclobenzaprine (FLEXERIL) 10 MG tablet Take 10 mg by mouth 3 (three) times daily as needed for muscle spasms.    [provider]  escitalopram (LEXAPRO) 20 MG tablet Take 20 mg by mouth daily.    [provider]  estradiol (ESTRACE) 2 MG tablet Take 2 mg by mouth daily.     [provider]  fluticasone (FLONASE) 50 MCG/ACT nasal spray Place 1 spray into both nostrils daily as needed for allergies.    [provider]  furosemide (LASIX) 40 MG tablet Take 1 tablet (40 mg total) by mouth daily. Take additional 40 mg when you have a weight gain of 3 lbs in 1 day or 5 lbs in 1 week. Patient taking differently: Take 40 mg by mouth See admin instructions. Take 40 mg by mouth once a day and an additional  40 with a weight gain of 3 pounds/day or 5 pounds/week 12/08/22   Rolly Salter, MD  Glycerin-Polysorbate 80 (REFRESH DRY EYE THERAPY OP) Place 1 drop into both eyes 3 (three) times daily as needed (for dryness).    [provider]  glycopyrrolate (ROBINUL) 1 MG tablet Take 1-2 mg by mouth daily as needed (for excessive saliva).    [provider]  lactulose (CHRONULAC) 10 GM/15ML solution Take 15 mLs by mouth 2 (two) times daily as needed for mild constipation. 10/22/22   [provider]  levocetirizine (XYZAL) 5 MG tablet Take 5 mg by mouth every evening.  09/16/19   [provider]  lidocaine (LIDODERM) 5 % Place 1 patch onto the skin daily. Remove & Discard patch within 12 hours or as directed by MD Patient taking differently: Place 1 patch onto the skin daily as needed (pain). Remove & Discard patch within 12 hours or as directed by MD 11/07/22   Tegeler, Canary Brim, MD  lidocaine (XYLOCAINE) 5 % ointment Apply 1 application topically 2 (two) times daily as needed (hemorrhoids).    [provider]  meloxicam (MOBIC) 15 MG tablet Take 15 mg by mouth daily.    [provider]  mupirocin ointment (BACTROBAN) 2 % Apply 1 Application topically 2 (two) times daily as needed ("for sores in the nose"). 10/27/22   [provider]  naloxone (NARCAN) 4 MG/0.1ML LIQD nasal spray kit Place 1 spray into the nose as needed (for overdose).     [provider]  nystatin (MYCOSTATIN/NYSTOP) powder Apply 1 g topically 4 (four) times daily as needed (irritation).    [provider]  ondansetron (ZOFRAN) 8 MG tablet Take 8 mg by mouth every 8 (eight) hours as needed for nausea or vomiting.    [provider]  oxyCODONE (ROXICODONE) 15 MG immediate release tablet Take 1 tablet (15 mg total) by mouth every 8 (eight) hours as needed for pain. Patient not taking: Reported on 10/25/2023 03/29/21   Hollice Espy, MD  Oxycodone HCl 20 MG TABS Take 20 mg by mouth See admin instructions. Take 20 mg by mouth every 4 hours- max of 5 tablets/24 hours    [provider]  OZEMPIC, 1 MG/DOSE, 4 MG/3ML SOPN Inject 1 mg into the skin every Monday.    [provider]  pantoprazole (PROTONIX) 40 MG tablet Take 40 mg by mouth 2 (two) times daily before a meal.    [provider]  PHAZYME MAXIMUM STRENGTH 250 MG CAPS Take 250 mg by mouth 2 (two) times daily as needed (for gas).    [provider]  polyethylene glycol (MIRALAX / GLYCOLAX) 17 g packet Take 17 g by mouth 2 (two) times daily. Patient taking  differently: Take 17 g by mouth daily as needed for moderate constipation (AND HOLD FOR DIARRHEA- mix as directed). 12/07/22   Rolly Salter, MD  prochlorperazine (COMPAZINE) 10 MG tablet Take 10 mg by mouth every 6 (six) hours as needed for nausea or vomiting (not relieved by Zofran).    [provider]  promethazine (PHENERGAN) 25 MG tablet Take 25 mg by mouth every 8 (eight) hours as needed for nausea or vomiting.    [provider]  rizatriptan (MAXALT-MLT) 10 MG disintegrating tablet Take 10 mg by mouth as needed for migraine (may repeat once in 2 hours, if no relief- dissolve orally).    [provider]  Simethicone 80 MG TABS Take 1  tablet (80 mg total) by mouth in the morning, at noon, in the evening, and at bedtime. Patient not taking: Reported on 10/25/2023 12/07/22   Rolly Salter, MD  simvastatin (ZOCOR) 20 MG tablet Take 20 mg by mouth at bedtime.    [provider]  topiramate (TOPAMAX) 50 MG tablet Take 50 mg by mouth daily as needed (migraines).    [provider]  traZODone (DESYREL) 50 MG tablet Take 100 mg by mouth at bedtime. 09/05/22   [provider]  triamcinolone cream (KENALOG) 0.1 % Apply 1 application  topically 2 (two) times daily as needed (for rashes).    [provider]      Allergies    Spironolactone, Bactrim [sulfamethoxazole-trimethoprim], Gabapentin, Amoxicillin, Clindamycin/lincomycin, Doxycycline, Influenza virus vaccine, Linzess [linaclotide], Metronidazole, and Treximet [sumatriptan-naproxen sodium]    Review of Systems   Review of Systems  Constitutional:  Negative for fever.  Respiratory:  Negative for shortness of breath.   Cardiovascular:  Negative for chest pain.  Gastrointestinal:  Positive for abdominal pain, nausea and vomiting. Negative for blood in stool, constipation and diarrhea.  Genitourinary:  Negative for dysuria.  All other systems reviewed and are negative.   Physical  Exam Updated Vital Signs BP 109/79   Pulse 74   Temp 98.4 F (36.9 C) (Oral)   Resp 11   Ht 1.651 m (5\' 5" )   Wt 98.4 kg   SpO2 94%   BMI 36.11 kg/m  Physical Exam Vitals and nursing note reviewed.  Constitutional:      Appearance: She is well-developed.     Comments: Morbidly obese  HENT:     Head: Normocephalic and atraumatic.  Eyes:     Pupils: Pupils are equal, round, and reactive to light.  Cardiovascular:     Rate and Rhythm: Normal rate and regular rhythm.     Heart sounds: Normal heart sounds.  Pulmonary:     Effort: Pulmonary effort is normal. No respiratory distress.     Breath sounds: No wheezing.  Abdominal:     General: Bowel sounds are normal.     Palpations: Abdomen is soft.     Tenderness: There is generalized abdominal tenderness. There is no guarding or rebound. Negative signs include Murphy's sign and Rovsing's sign.  Musculoskeletal:     Cervical back: Neck supple.  Skin:    General: Skin is warm and dry.  Neurological:     General: No focal deficit present.     Mental Status: She is alert and oriented to person, place, and time.     ED Results / Procedures / Treatments   Labs (all labs ordered are listed, but only abnormal results are displayed) Labs Reviewed  COMPREHENSIVE METABOLIC PANEL - Abnormal; Notable for the following components:      Result Value   Glucose, Bld 119 (*)    Calcium 8.6 (*)    Anion gap 3 (*)    All other components within normal limits  CBC - Abnormal; Notable for the following components:   Hemoglobin 10.8 (*)    HCT 35.5 (*)    MCH 24.7 (*)    All other components within normal limits  LIPASE, BLOOD  URINALYSIS, ROUTINE W REFLEX MICROSCOPIC    EKG None  Radiology CT ABDOMEN PELVIS W CONTRAST Result Date: 11/29/2023 CLINICAL DATA:  Abdominal pain. EXAM: CT ABDOMEN AND PELVIS WITH CONTRAST TECHNIQUE: Multidetector CT imaging of the abdomen and pelvis was performed using the standard protocol following bolus  administration of intravenous contrast. RADIATION DOSE REDUCTION: This exam was performed according to the departmental dose-optimization program which includes automated exposure control, adjustment of the mA and/or kV according to patient size and/or use of iterative reconstruction technique. CONTRAST:  OMNIPAQUE IOHEXOL 300 MG/ML  SOLN COMPARISON:  October 27, 2023 and October 25, 2023 FINDINGS: Lower chest: Mild atelectatic changes are seen within the bilateral lung bases. Hepatobiliary: The liver is cirrhotic in appearance. No focal liver abnormality is seen. No gallstones, gallbladder wall thickening, or biliary dilatation. Pancreas: Unremarkable. No pancreatic ductal dilatation or surrounding inflammatory changes. Spleen: Stable, partially calcified cysts are seen within an otherwise normal-appearing spleen. Adrenals/Urinary Tract: Adrenal glands are unremarkable. Kidneys are normal in size, without renal calculi or hydronephrosis. Small simple cysts are seen within the right kidney. The urinary bladder is poorly distended and subsequently limited in evaluation. Stomach/Bowel: Stomach is within normal limits. The appendix is not clearly identified. No evidence of bowel wall thickening, distention, or inflammatory changes. Vascular/Lymphatic: Aortic atherosclerosis. No enlarged abdominal or pelvic lymph nodes. Reproductive: Status post hysterectomy. No adnexal masses. Other: No abdominal wall hernia or abnormality. There is a mild-to-moderate amount of abdominopelvic ascites, mildly decreased in severity when compared to the prior exam. Musculoskeletal: There is grade 1 anterolisthesis of the L5 vertebral body on S1. IMPRESSION: 1. Cirrhotic liver. 2. Mild-to-moderate amount of abdominopelvic ascites, mildly decreased in severity when compared to the prior exam. 3. Grade 1 anterolisthesis of the L5 vertebral body on S1. 4. Aortic atherosclerosis. Aortic Atherosclerosis (ICD10-I70.0). Electronically  Signed   By: Aram Candela M.D.   On: 11/29/2023 00:13    Procedures Procedures    Medications Ordered in ED Medications  morphine (PF) 4 MG/ML injection 4 mg (4 mg Intravenous Given 11/28/23 2330)  ondansetron (ZOFRAN) injection 4 mg (4 mg Intravenous Given 11/28/23 2330)  iohexol (OMNIPAQUE) 300 MG/ML solution 100 mL (100 mLs Intravenous Contrast Given 11/28/23 2359)  oxyCODONE (Oxy IR/ROXICODONE) immediate release tablet 20 mg (20 mg Oral Given 11/29/23 0101)    ED Course/ Medical Decision Making/ A&P                                 Medical Decision Making Amount and/or Complexity of Data Reviewed Labs: ordered. Radiology: ordered.  Risk Prescription drug management.   This patient presents to the ED for concern of abdominal pain, this involves an extensive number of treatment options, and is a complaint that carries with it a high risk of complications and morbidity.  I considered the following differential and admission for this acute, potentially life threatening condition.  The differential diagnosis includes Colitis, appendicitis, obstruction pathology, chronic pain, mesenteric ischemia  MDM:    This is a 63 year old female who presents with abdominal pain.  History of chronic pain on pain management.  Reports she has had worsening abdominal pain over the last several weeks; however she has not been able to get control of it at home.  She is seeing her pain management specialist and her GI doctor and is due to have a CT scan.  She is overall nontoxic and vital signs are reassuring.  Reports nausea and vomiting but is having bowel movements.  Labs obtained and reviewed and at the patient's baseline.  No leukocytosis.  No LFT abnormalities or lipase abnormalities.  CT scan was obtained.  CT does not show any obstructive pathology and is essentially stable when compared to prior.  Ascites has actually improved.  Discussed this with the patient.  She was given 1 dose IV pain  medication and her home oral dose of oxycodone.  Recommend that she follow-up with her GI doctor and pain management doctor.  (Labs, imaging, consults)  Labs: I Ordered, and personally interpreted labs.  The pertinent results include: CBC, CMP, lipase, urinalysis  Imaging Studies ordered: I ordered imaging studies including CT I independently visualized and interpreted imaging. I agree with the radiologist interpretation  Additional history obtained from chart review.  External records from outside source obtained and reviewed including pain management notes  Cardiac Monitoring: The patient was maintained on a cardiac monitor.  If on the cardiac monitor, I personally viewed and interpreted the cardiac monitored which showed an underlying rhythm of: Sinus  Reevaluation: After the interventions noted above, I reevaluated the patient and found that they have :stayed the same  Social Determinants of Health:  lives independently  Disposition: Discharge  Co morbidities that complicate the patient evaluation  Past Medical History:  Diagnosis Date   Abdominal pain 01/12/2019   Abdominal pain, generalized 07/10/2014   Acute on chronic respiratory failure with hypoxia ([name]) 10/18/2018   Anxiety    Arthritis    Ascites 05/01/2019   NALD   Asthma    CAD (coronary artery disease)    cath 2010 with 60-70% left Cx and normal LVF   Chronic low back pain with left-sided sciatica 10/15/2015   Chronic pain    Chronic prescription benzodiazepine use 05/01/2019   Chronic, continuous use of opioids 05/01/2019   Cirrhosis of liver not due to alcohol ([name]) 05/01/2019   Cirrhosis of liver: per CT 07/11/2014   Constipation by delayed colonic transit 12/09/2014   COPD (chronic obstructive pulmonary disease) ([name])    COPD exacerbation ([name]) 10/12/2016   Dehiscence of surgical wound left knee 03/16/2013   Depression    Depression with anxiety 01/11/2019   Deviated septum    Diabetes mellitus without  complication ([name])    type 2   Generalized abdominal pain 12/09/2014   GERD (gastroesophageal reflux disease)    Headache(784.0)    migraines   Hepatic cirrhosis ([name]) 06/2014   HLD (hyperlipidemia) 01/11/2019   Hypercholesterolemia    Hypertension    Hypertension associated with diabetes ([name])    Ileus ([name]) 07/11/2014   Influenza A 10/2018   Lobar pneumonia ([name])    Microcytic anemia 01/11/2019   Migraine headache 12/09/2014   Morbid obesity ([name]) 03/09/2013   Obesity    OSA (obstructive sleep apnea) 01/11/2019   Pars defect with spondylolisthesis 05/01/2019   Perforated bowel ([name])    PONV (postoperative nausea and vomiting)    Respiratory failure, acute-on-chronic ([name]) 12/11/2010   S/P left PF UKR 03/07/2013   Seizures ([name])    as a little girl 64 years old   Shortness of breath    Sleep apnea    dont wear bipap . lost weight   Spondylolisthesis, grade 2    Tobacco abuse    Type 2 diabetes mellitus with obesity ([name]) 01/11/2019     Medicines Meds ordered this encounter  Medications   morphine (PF) 4 MG/ML injection 4 mg   ondansetron (ZOFRAN) injection 4 mg   iohexol (OMNIPAQUE) 300 MG/ML solution 100 mL   oxyCODONE (Oxy IR/ROXICODONE) immediate release tablet 20 mg    Refill:  0    I have reviewed the patients home medicines and have made adjustments as needed  Problem List / ED  Course: Problem List Items Addressed This Visit       Other   Chronic abdominal pain - Primary (Chronic)                Final Clinical Impression(s) / ED Diagnoses Final diagnoses:  Chronic abdominal pain    Rx / DC Orders ED Discharge Orders     None         Shon Baton, MD 11/29/23 [phone]

## 2023-11-29 NOTE — Discharge Instructions (Signed)
 You were seen today for abdominal pain.  This is likely chronic in nature.  Your CT scan does not show any new issues.  Follow-up with your gastroenterologist and your pain management specialist regarding ongoing issues.

## 2023-12-02 ENCOUNTER — Ambulatory Visit (HOSPITAL_COMMUNITY)
Admission: RE | Admit: 2023-12-02 | Discharge: 2023-12-02 | Disposition: A | Discharge status: Home / Self Care | Source: Ambulatory Visit | Attending: Gastroenterology | Admitting: Gastroenterology

## 2023-12-02 DIAGNOSIS — K7460 Unspecified cirrhosis of liver: Secondary | ICD-10-CM | POA: Insufficient documentation

## 2023-12-02 DIAGNOSIS — R1084 Generalized abdominal pain: Secondary | ICD-10-CM | POA: Insufficient documentation

## 2023-12-02 DIAGNOSIS — R188 Other ascites: Secondary | ICD-10-CM | POA: Insufficient documentation

## 2023-12-02 DIAGNOSIS — R161 Splenomegaly, not elsewhere classified: Secondary | ICD-10-CM | POA: Diagnosis not present

## 2023-12-02 MED ORDER — IOHEXOL 300 MG/ML  SOLN
100.0000 mL | Freq: Once | INTRAMUSCULAR | Status: AC | PRN
  Administered 2023-12-02: 100 mL via INTRAVENOUS

## 2023-12-02 MED ORDER — SODIUM CHLORIDE (PF) 0.9 % IJ SOLN
INTRAMUSCULAR | Status: AC
  Filled 2023-12-02: qty 50

## 2023-12-04 DIAGNOSIS — Z79899 Other long term (current) drug therapy: Secondary | ICD-10-CM | POA: Diagnosis not present

## 2023-12-05 DIAGNOSIS — J9622 Acute and chronic respiratory failure with hypercapnia: Secondary | ICD-10-CM | POA: Diagnosis not present

## 2023-12-05 DIAGNOSIS — J9621 Acute and chronic respiratory failure with hypoxia: Secondary | ICD-10-CM | POA: Diagnosis not present

## 2023-12-05 DIAGNOSIS — J9611 Chronic respiratory failure with hypoxia: Secondary | ICD-10-CM | POA: Diagnosis not present

## 2023-12-05 DIAGNOSIS — I2510 Atherosclerotic heart disease of native coronary artery without angina pectoris: Secondary | ICD-10-CM | POA: Diagnosis not present

## 2023-12-25 DIAGNOSIS — J449 Chronic obstructive pulmonary disease, unspecified: Secondary | ICD-10-CM | POA: Diagnosis not present

## 2023-12-25 DIAGNOSIS — G8929 Other chronic pain: Secondary | ICD-10-CM | POA: Diagnosis not present

## 2023-12-25 DIAGNOSIS — M2550 Pain in unspecified joint: Secondary | ICD-10-CM | POA: Diagnosis not present

## 2023-12-25 DIAGNOSIS — R109 Unspecified abdominal pain: Secondary | ICD-10-CM | POA: Diagnosis not present

## 2023-12-25 DIAGNOSIS — Z79899 Other long term (current) drug therapy: Secondary | ICD-10-CM | POA: Diagnosis not present

## 2023-12-25 DIAGNOSIS — M542 Cervicalgia: Secondary | ICD-10-CM | POA: Diagnosis not present

## 2023-12-25 DIAGNOSIS — K7460 Unspecified cirrhosis of liver: Secondary | ICD-10-CM | POA: Diagnosis not present

## 2023-12-25 DIAGNOSIS — M545 Low back pain, unspecified: Secondary | ICD-10-CM | POA: Diagnosis not present

## 2023-12-27 NOTE — Progress Notes (Unsigned)
   This encounter was created in error - please disregard.

## 2023-12-28 ENCOUNTER — Ambulatory Visit: Payer: 59 | Admitting: Cardiology

## 2023-12-28 DIAGNOSIS — E785 Hyperlipidemia, unspecified: Secondary | ICD-10-CM

## 2023-12-28 DIAGNOSIS — I2510 Atherosclerotic heart disease of native coronary artery without angina pectoris: Secondary | ICD-10-CM

## 2023-12-28 DIAGNOSIS — R600 Localized edema: Secondary | ICD-10-CM

## 2023-12-28 DIAGNOSIS — I10 Essential (primary) hypertension: Secondary | ICD-10-CM

## 2024-01-05 DIAGNOSIS — J9621 Acute and chronic respiratory failure with hypoxia: Secondary | ICD-10-CM | POA: Diagnosis not present

## 2024-01-05 DIAGNOSIS — J9611 Chronic respiratory failure with hypoxia: Secondary | ICD-10-CM | POA: Diagnosis not present

## 2024-01-05 DIAGNOSIS — J9622 Acute and chronic respiratory failure with hypercapnia: Secondary | ICD-10-CM | POA: Diagnosis not present

## 2024-01-05 DIAGNOSIS — I2510 Atherosclerotic heart disease of native coronary artery without angina pectoris: Secondary | ICD-10-CM | POA: Diagnosis not present

## 2024-01-20 ENCOUNTER — Encounter (HOSPITAL_BASED_OUTPATIENT_CLINIC_OR_DEPARTMENT_OTHER): Payer: Self-pay | Admitting: Emergency Medicine

## 2024-01-20 ENCOUNTER — Emergency Department (HOSPITAL_BASED_OUTPATIENT_CLINIC_OR_DEPARTMENT_OTHER)
Admission: EM | Admit: 2024-01-20 | Discharge: 2024-01-20 | Disposition: A | Discharge status: Home / Self Care | Attending: Emergency Medicine | Admitting: Emergency Medicine

## 2024-01-20 DIAGNOSIS — J449 Chronic obstructive pulmonary disease, unspecified: Secondary | ICD-10-CM | POA: Diagnosis not present

## 2024-01-20 DIAGNOSIS — M7918 Myalgia, other site: Secondary | ICD-10-CM | POA: Insufficient documentation

## 2024-01-20 DIAGNOSIS — I2510 Atherosclerotic heart disease of native coronary artery without angina pectoris: Secondary | ICD-10-CM | POA: Diagnosis not present

## 2024-01-20 DIAGNOSIS — M545 Low back pain, unspecified: Secondary | ICD-10-CM | POA: Diagnosis not present

## 2024-01-20 DIAGNOSIS — E119 Type 2 diabetes mellitus without complications: Secondary | ICD-10-CM | POA: Insufficient documentation

## 2024-01-20 DIAGNOSIS — Z720 Tobacco use: Secondary | ICD-10-CM | POA: Insufficient documentation

## 2024-01-20 DIAGNOSIS — Z79899 Other long term (current) drug therapy: Secondary | ICD-10-CM | POA: Diagnosis not present

## 2024-01-20 DIAGNOSIS — I10 Essential (primary) hypertension: Secondary | ICD-10-CM | POA: Diagnosis not present

## 2024-01-20 DIAGNOSIS — Z7951 Long term (current) use of inhaled steroids: Secondary | ICD-10-CM | POA: Insufficient documentation

## 2024-01-20 MED ORDER — DEXAMETHASONE SODIUM PHOSPHATE 10 MG/ML IJ SOLN
10.0000 mg | Freq: Once | INTRAMUSCULAR | Status: AC
  Administered 2024-01-20: 10 mg via INTRAMUSCULAR
  Filled 2024-01-20: qty 1

## 2024-01-20 MED ORDER — HYDROMORPHONE HCL 1 MG/ML IJ SOLN
1.0000 mg | Freq: Once | INTRAMUSCULAR | Status: AC
  Administered 2024-01-20: 1 mg via INTRAMUSCULAR
  Filled 2024-01-20: qty 1

## 2024-01-20 MED ORDER — METHYLPREDNISOLONE 4 MG PO TBPK: ORAL_TABLET | ORAL | 0 refills | Status: AC

## 2024-01-20 NOTE — ED Provider Notes (Signed)
 [place] EMERGENCY DEPARTMENT AT [facility] Provider Note   CSN: [id number] Arrival date & time: 01/20/24  1802     History {Add pertinent medical, surgical, social history, OB history to HPI:1} Chief Complaint  Patient presents with   Back Pain    [name] is a 63 y.o. female.  HPI     63 year old female with a history of nonalcoholic liver cirrhosis requiring outpatient paracentesis as needed, anxiety/depression, chronic pain syndrome, GERD, hypertension, hyperlipidemia, migraine, COPD, chronic pain syndrome who presents with concern for left buttock pain.   3/13 steroid injection 5 days ago pain came back This time is severe, can't put shoes and socks on because pain so severe 20mg  oxycodone  not helping the pain, taking meloxicam  and muscle relaxers, pain so severe is nauseas No falls, no loss of control of bowel or bladder. No numbness or weakness.  Feet with both sides have some numbness from neuropathy. No fever.  No hx of IVDU. Chronic abdominal pain from NASH No urinary symptoms Burning sharp pain right in left buttock  Laying on side at times to help  Past Medical History:  Diagnosis Date   Abdominal pain 01/12/2019   Abdominal pain, generalized 07/10/2014   Acute on chronic respiratory failure with hypoxia ([name]) 10/18/2018   Anxiety    Arthritis    Ascites 05/01/2019   NALD   Asthma    CAD (coronary artery disease)    cath 2010 with 60-70% left Cx and normal LVF   Chronic low back pain with left-sided sciatica 10/15/2015   Chronic pain    Chronic prescription benzodiazepine use 05/01/2019   Chronic, continuous use of opioids 05/01/2019   Cirrhosis of liver not due to alcohol  ([name]) 05/01/2019   Cirrhosis of liver: per CT 07/11/2014   Constipation by delayed colonic transit 12/09/2014   COPD (chronic obstructive pulmonary disease) ([name])    COPD exacerbation ([name]) 10/12/2016   Dehiscence of surgical wound left knee 03/16/2013   Depression     Depression with anxiety 01/11/2019   Deviated septum    Diabetes mellitus without complication ([name])    type 2   Generalized abdominal pain 12/09/2014   GERD (gastroesophageal reflux disease)    Headache(784.0)    migraines   Hepatic cirrhosis ([name]) 06/2014   HLD (hyperlipidemia) 01/11/2019   Hypercholesterolemia    Hypertension    Hypertension associated with diabetes ([name])    Ileus ([name]) 07/11/2014   Influenza A 10/2018   Lobar pneumonia ([name])    Microcytic anemia 01/11/2019   Migraine headache 12/09/2014   Morbid obesity ([name]) 03/09/2013   Obesity    OSA (obstructive sleep apnea) 01/11/2019   Pars defect with spondylolisthesis 05/01/2019   Perforated bowel ([name])    PONV (postoperative nausea and vomiting)    Respiratory failure, acute-on-chronic ([name]) 12/11/2010   S/P left PF UKR 03/07/2013   Seizures ([name])    as a little girl 63 years old   Shortness of breath    Sleep apnea    dont wear bipap . lost weight   Spondylolisthesis, grade 2    Tobacco abuse    Type 2 diabetes mellitus with obesity ([name]) 01/11/2019     Home Medications Prior to Admission medications   Medication Sig Start Date End Date Taking? Authorizing Provider  acetaminophen  (TYLENOL ) 500 MG tablet Take 500-1,000 mg by mouth every 6 (six) hours as needed for mild pain (pain score 1-3) or headache.    [provider]  albuterol  (PROVENTIL   HFA;VENTOLIN  HFA) 108 (90 BASE) MCG/ACT inhaler Inhale 2 puffs into the lungs every 6 (six) hours as needed for wheezing or shortness of breath.    [provider]  albuterol  (PROVENTIL ) (2.5 MG/3ML) 0.083% nebulizer solution Take 2.5 mg by nebulization every 6 (six) hours as needed for wheezing or shortness of breath.    [provider]  ALPRAZolam  (XANAX ) 1 MG tablet Take 1-2 mg by mouth See admin instructions. Take 2 mg by mouth at bedtime and an additional 1 mg once a day as needed for anxiety    [provider]  amLODipine  (NORVASC ) 5 MG  tablet Take 5 mg by mouth daily.    [provider]  baclofen  (LIORESAL ) 10 MG tablet Take 1 tablet (10 mg total) by mouth 3 (three) times daily. Patient taking differently: Take 10 mg by mouth 3 (three) times daily as needed for muscle spasms. 12/07/22   Kraig Peru, MD  benazepril  (LOTENSIN ) 40 MG tablet Take 40 mg by mouth daily.    [provider]  Benzocaine-Resorcinol (VAGISIL EX) Apply 1 application topically daily.    [provider]  bisacodyl  (DULCOLAX) 10 MG suppository Place 1 suppository (10 mg total) rectally daily as needed for moderate constipation. 12/07/22   Kraig Peru, MD  budesonide  (PULMICORT ) 1 MG/2ML nebulizer solution Take 1 mg by nebulization daily as needed (for shortness of breath- when sick). 04/08/21   [provider]  cloNIDine  (CATAPRES ) 0.2 MG tablet Take 0.2 mg by mouth daily.    [provider]  cyclobenzaprine  (FLEXERIL ) 10 MG tablet Take 10 mg by mouth 3 (three) times daily as needed for muscle spasms.    [provider]  escitalopram  (LEXAPRO ) 20 MG tablet Take 20 mg by mouth daily.    [provider]  estradiol  (ESTRACE ) 2 MG tablet Take 2 mg by mouth daily.     [provider]  fluticasone  (FLONASE ) 50 MCG/ACT nasal spray Place 1 spray into both nostrils daily as needed for allergies.    [provider]  furosemide  (LASIX ) 40 MG tablet Take 1 tablet (40 mg total) by mouth daily. Take additional 40 mg when you have a weight gain of 3 lbs in 1 day or 5 lbs in 1 week. Patient taking differently: Take 40 mg by mouth See admin instructions. Take 40 mg by mouth once a day and an additional 40 with a weight gain of 3 pounds/day or 5 pounds/week 12/08/22   Patel, Pranav M, MD  Glycerin -Polysorbate 80 (REFRESH DRY EYE THERAPY OP) Place 1 drop into both eyes 3 (three) times daily as needed (for dryness).    [provider]  glycopyrrolate  (ROBINUL ) 1 MG tablet Take 1-2 mg by  mouth daily as needed (for excessive saliva).    [provider]  lactulose  (CHRONULAC ) 10 GM/15ML solution Take 15 mLs by mouth 2 (two) times daily as needed for mild constipation. 10/22/22   [provider]  levocetirizine (XYZAL ) 5 MG tablet Take 5 mg by mouth every evening.  09/16/19   [provider]  lidocaine  (LIDODERM ) 5 % Place 1 patch onto the skin daily. Remove & Discard patch within 12 hours or as directed by MD Patient taking differently: Place 1 patch onto the skin daily as needed (pain). Remove & Discard patch within 12 hours or as directed by MD 11/07/22   Tegeler, Marine Sia, MD  lidocaine  (XYLOCAINE ) 5 % ointment Apply 1 application topically 2 (two) times daily as needed (  hemorrhoids).    [provider]  meloxicam  (MOBIC ) 15 MG tablet Take 15 mg by mouth daily.    [provider]  mupirocin ointment (BACTROBAN) 2 % Apply 1 Application topically 2 (two) times daily as needed ("for sores in the nose"). 10/27/22   [provider]  naloxone  (NARCAN ) 4 MG/0.1ML LIQD nasal spray kit Place 1 spray into the nose as needed (for overdose).     [provider]  nystatin  (MYCOSTATIN /NYSTOP ) powder Apply 1 g topically 4 (four) times daily as needed (irritation).    [provider]  ondansetron  (ZOFRAN ) 8 MG tablet Take 8 mg by mouth every 8 (eight) hours as needed for nausea or vomiting.    [provider]  oxyCODONE  (ROXICODONE ) 15 MG immediate release tablet Take 1 tablet (15 mg total) by mouth every 8 (eight) hours as needed for pain. Patient not taking: Reported on 10/25/2023 03/29/21   Krishnan, Sendil K, MD  Oxycodone  HCl 20 MG TABS Take 20 mg by mouth See admin instructions. Take 20 mg by mouth every 4 hours- max of 5 tablets/24 hours    [provider]  OZEMPIC, 1 MG/DOSE, 4 MG/3ML SOPN Inject 1 mg into the skin every Monday.    [provider]  pantoprazole  (PROTONIX ) 40 MG tablet Take 40 mg by  mouth 2 (two) times daily before a meal.    [provider]  PHAZYME MAXIMUM STRENGTH 250 MG CAPS Take 250 mg by mouth 2 (two) times daily as needed (for gas).    [provider]  polyethylene glycol (MIRALAX  / GLYCOLAX ) 17 g packet Take 17 g by mouth 2 (two) times daily. Patient taking differently: Take 17 g by mouth daily as needed for moderate constipation (AND HOLD FOR DIARRHEA- mix as directed). 12/07/22   Kraig Peru, MD  prochlorperazine  (COMPAZINE ) 10 MG tablet Take 10 mg by mouth every 6 (six) hours as needed for nausea or vomiting (not relieved by Zofran ).    [provider]  promethazine  (PHENERGAN ) 25 MG tablet Take 25 mg by mouth every 8 (eight) hours as needed for nausea or vomiting.    [provider]  rizatriptan (MAXALT-MLT) 10 MG disintegrating tablet Take 10 mg by mouth as needed for migraine (may repeat once in 2 hours, if no relief- dissolve orally).    [provider]  Simethicone  80 MG TABS Take 1 tablet (80 mg total) by mouth in the morning, at noon, in the evening, and at bedtime. Patient not taking: Reported on 10/25/2023 12/07/22   Kraig Peru, MD  simvastatin  (ZOCOR ) 20 MG tablet Take 20 mg by mouth at bedtime.    [provider]  topiramate  (TOPAMAX ) 50 MG tablet Take 50 mg by mouth daily as needed (migraines).    [provider]  traZODone  (DESYREL ) 50 MG tablet Take 100 mg by mouth at bedtime. 09/05/22   [provider]  triamcinolone  cream (KENALOG ) 0.1 % Apply 1 application  topically 2 (two) times daily as needed (for rashes).    [provider]      Allergies    Spironolactone , Bactrim [sulfamethoxazole-trimethoprim], Gabapentin, Amoxicillin , Clindamycin/lincomycin, Doxycycline , Influenza virus vaccine, Linzess  [linaclotide ], Metronidazole , and Treximet [sumatriptan-naproxen sodium]    Review of Systems   Review of Systems  Physical Exam Updated Vital Signs BP (!) 141/68 (BP  Location: Right Arm)   Pulse 74   Temp 98.2 F (36.8 C)   Resp 18   SpO2 98%  Physical Exam  ED Results / Procedures / Treatments   Labs (all labs ordered are listed, but only abnormal results are displayed) Labs Reviewed - No data to display  EKG None  Radiology No results found.  Procedures Procedures  {Document cardiac monitor, telemetry assessment procedure when appropriate:1}  Medications Ordered in ED Medications - No data to display  ED Course/ Medical Decision Making/ A&P   {   Click here for ABCD2, HEART and other calculatorsREFRESH Note before signing :1}                              Medical Decision Making  ***  {Document critical care time when appropriate:1} {Document review of labs and clinical decision tools ie heart score, Chads2Vasc2 etc:1}  {Document your independent review of radiology images, and any outside records:1} {Document your discussion with family members, caretakers, and with consultants:1} {Document social determinants of health affecting pt's care:1} {Document your decision making why or why not admission, treatments were needed:1} Final Clinical Impression(s) / ED Diagnoses Final diagnoses:  None    Rx / DC Orders ED Discharge Orders     None

## 2024-01-20 NOTE — ED Triage Notes (Signed)
 C/o "left butt" pain x5 day. States she normally gets steroid injections for pain control but insurance complications has prevented any recent appt.

## 2024-01-20 NOTE — ED Notes (Signed)
 Pt ambulated to room- no signs of distress. Urine sample obtained.

## 2024-01-22 DIAGNOSIS — M545 Low back pain, unspecified: Secondary | ICD-10-CM | POA: Diagnosis not present

## 2024-01-22 DIAGNOSIS — Z79899 Other long term (current) drug therapy: Secondary | ICD-10-CM | POA: Diagnosis not present

## 2024-01-22 DIAGNOSIS — G8929 Other chronic pain: Secondary | ICD-10-CM | POA: Diagnosis not present

## 2024-01-22 DIAGNOSIS — M2550 Pain in unspecified joint: Secondary | ICD-10-CM | POA: Diagnosis not present

## 2024-01-22 DIAGNOSIS — J449 Chronic obstructive pulmonary disease, unspecified: Secondary | ICD-10-CM | POA: Diagnosis not present

## 2024-01-22 DIAGNOSIS — R109 Unspecified abdominal pain: Secondary | ICD-10-CM | POA: Diagnosis not present

## 2024-01-22 DIAGNOSIS — M542 Cervicalgia: Secondary | ICD-10-CM | POA: Diagnosis not present

## 2024-01-22 DIAGNOSIS — K7460 Unspecified cirrhosis of liver: Secondary | ICD-10-CM | POA: Diagnosis not present

## 2024-01-27 DIAGNOSIS — Z79899 Other long term (current) drug therapy: Secondary | ICD-10-CM | POA: Diagnosis not present

## 2024-02-04 DIAGNOSIS — J9622 Acute and chronic respiratory failure with hypercapnia: Secondary | ICD-10-CM | POA: Diagnosis not present

## 2024-02-04 DIAGNOSIS — I2510 Atherosclerotic heart disease of native coronary artery without angina pectoris: Secondary | ICD-10-CM | POA: Diagnosis not present

## 2024-02-04 DIAGNOSIS — J9611 Chronic respiratory failure with hypoxia: Secondary | ICD-10-CM | POA: Diagnosis not present

## 2024-02-04 DIAGNOSIS — J9621 Acute and chronic respiratory failure with hypoxia: Secondary | ICD-10-CM | POA: Diagnosis not present

## 2024-02-10 DIAGNOSIS — E119 Type 2 diabetes mellitus without complications: Secondary | ICD-10-CM | POA: Diagnosis not present

## 2024-02-10 DIAGNOSIS — D508 Other iron deficiency anemias: Secondary | ICD-10-CM | POA: Diagnosis not present

## 2024-02-10 DIAGNOSIS — I2510 Atherosclerotic heart disease of native coronary artery without angina pectoris: Secondary | ICD-10-CM | POA: Diagnosis not present

## 2024-02-10 DIAGNOSIS — J449 Chronic obstructive pulmonary disease, unspecified: Secondary | ICD-10-CM | POA: Diagnosis not present

## 2024-02-10 DIAGNOSIS — G894 Chronic pain syndrome: Secondary | ICD-10-CM | POA: Diagnosis not present

## 2024-02-10 DIAGNOSIS — I10 Essential (primary) hypertension: Secondary | ICD-10-CM | POA: Diagnosis not present

## 2024-02-10 DIAGNOSIS — R6889 Other general symptoms and signs: Secondary | ICD-10-CM | POA: Diagnosis not present

## 2024-02-10 DIAGNOSIS — E7800 Pure hypercholesterolemia, unspecified: Secondary | ICD-10-CM | POA: Diagnosis not present

## 2024-02-10 DIAGNOSIS — M5416 Radiculopathy, lumbar region: Secondary | ICD-10-CM | POA: Diagnosis not present

## 2024-02-10 DIAGNOSIS — K7469 Other cirrhosis of liver: Secondary | ICD-10-CM | POA: Diagnosis not present

## 2024-02-11 ENCOUNTER — Encounter: Payer: Self-pay | Admitting: Family Medicine

## 2024-02-12 ENCOUNTER — Other Ambulatory Visit: Payer: Self-pay | Admitting: Family Medicine

## 2024-02-12 DIAGNOSIS — M5416 Radiculopathy, lumbar region: Secondary | ICD-10-CM

## 2024-02-19 DIAGNOSIS — Z79899 Other long term (current) drug therapy: Secondary | ICD-10-CM | POA: Diagnosis not present

## 2024-02-19 DIAGNOSIS — M542 Cervicalgia: Secondary | ICD-10-CM | POA: Diagnosis not present

## 2024-02-19 DIAGNOSIS — R109 Unspecified abdominal pain: Secondary | ICD-10-CM | POA: Diagnosis not present

## 2024-02-19 DIAGNOSIS — M545 Low back pain, unspecified: Secondary | ICD-10-CM | POA: Diagnosis not present

## 2024-02-19 DIAGNOSIS — K7460 Unspecified cirrhosis of liver: Secondary | ICD-10-CM | POA: Diagnosis not present

## 2024-02-19 DIAGNOSIS — J449 Chronic obstructive pulmonary disease, unspecified: Secondary | ICD-10-CM | POA: Diagnosis not present

## 2024-02-19 DIAGNOSIS — M2550 Pain in unspecified joint: Secondary | ICD-10-CM | POA: Diagnosis not present

## 2024-02-19 DIAGNOSIS — G8929 Other chronic pain: Secondary | ICD-10-CM | POA: Diagnosis not present

## 2024-03-03 ENCOUNTER — Ambulatory Visit (INDEPENDENT_AMBULATORY_CARE_PROVIDER_SITE_OTHER): Admitting: Internal Medicine

## 2024-03-03 ENCOUNTER — Encounter: Payer: Self-pay | Admitting: Internal Medicine

## 2024-03-03 VITALS — BP 112/63 | HR 66 | Ht 60.0 in | Wt 216.2 lb

## 2024-03-03 DIAGNOSIS — R9389 Abnormal findings on diagnostic imaging of other specified body structures: Secondary | ICD-10-CM

## 2024-03-03 DIAGNOSIS — J9611 Chronic respiratory failure with hypoxia: Secondary | ICD-10-CM

## 2024-03-03 DIAGNOSIS — U071 COVID-19: Secondary | ICD-10-CM

## 2024-03-03 DIAGNOSIS — Z87891 Personal history of nicotine dependence: Secondary | ICD-10-CM | POA: Diagnosis not present

## 2024-03-03 DIAGNOSIS — R0602 Shortness of breath: Secondary | ICD-10-CM

## 2024-03-03 NOTE — Patient Instructions (Signed)
 It was a pleasure to see you today!  Please schedule follow up with myself in 6 weeks.  If my schedule is not open yet, we will contact you with a reminder closer to that time. Please call [phone] if you haven't heard from us  a month before, and always call us  sooner if issues or concerns arise. You can also send us  a message through MyChart, but but aware that this is not to be used for urgent issues and it may take up to 5-7 days to receive a reply. Please be aware that you will likely be able to view your results before I have a chance to respond to them. Please give us  5 business days to respond to any non-urgent results.    Before your next visit I would like you to have: CT Chest of Chest Full set of PFTs   VISIT SUMMARY:  You came in today because of low oxygen  levels and shortness of breath that have been troubling you for the past two weeks. These symptoms are part of your chronic lung disease, which has been a recurring issue for you. We discussed your history of severe COVID-19 infection, cirrhosis, high blood pressure, and diabetes. We also reviewed your current medications and past medical history.  YOUR PLAN:  -CHRONIC OBSTRUCTIVE PULMONARY DISEASE (COPD): COPD is a chronic lung disease often caused by smoking, leading to breathing difficulties and low oxygen  levels. We will order pulmonary function tests to assess the severity of your COPD. Continue using your albuterol  nebulizer and inhaler as needed. We will also evaluate if you need home oxygen  therapy based on your oxygen  levels during activity.  -COVID-19 WITH LUNG CHANGES: Your severe COVID-19 infection may have caused lasting changes in your lungs. We will order a high-resolution CT scan of your chest to check for any remaining lung issues.   INSTRUCTIONS:  Please follow up with the pulmonary function tests and high-resolution CT scan as ordered. Continue using your albuterol  inhaler and nebulizer medications as  prescribed. If you experience worsening symptoms or have any concerns, contact our office immediately.

## 2024-03-03 NOTE — Progress Notes (Signed)
 [name]    [id number]    Feb 06, 1961  Primary Care Physician:Harris, Amiel Kalata, MD  Referring Physician: Roselind Congo, MD [phone] [address] [place],  [place] [address] Reason for Consultation: abnormal CT Chest Date of Consultation: 03/03/2024  Chief complaint:   Chief Complaint  Patient presents with   Consult    Abnormal ct      HPI:  Discussed the use of AI scribe software for clinical note transcription with the patient, who gave verbal consent to proceed.  History of Present Illness [name] is a 63 year old female with chronic lung disease who presents with low oxygen  levels and shortness of breath.  For the past two weeks, she has experienced oxygen  levels below 95% and a pulse rate in the low 60s. These low oxygen  levels are associated with persistent headaches. This fluctuation in oxygen  levels is a recurring issue, occurring once or twice a year, and has previously led to hospitalization due to heart failure.  She experiences shortness of breath, particularly after walking quickly, and notes a dry throat and difficulty talking. These symptoms have been present for two weeks, although she has had breathing issues for decades, with episodes severe enough to cause pneumonia and require hospital admission.  She has a history of a severe COVID-19 infection in February, which was possibly accompanied by pneumonia and led to hospitalization. She has been prescribed oxygen  in the past but returned the equipment due to lack of use and cost concerns. She currently uses albuterol  and Pulmicort  as needed, with albuterol  being used daily for the past few days.  Her past medical history includes cirrhosis, high blood pressure, and diabetes. She undergoes paracentesis for fluid removal from her abdomen. She has a history of smoking, having switched from cigarettes to vaping three years ago after smoking two packs a day at her heaviest.  Her family history  includes a grandfather with emphysema on her father's side. She lived with smokers during her childhood. She resides with her husband, who assists with daily activities due to her limitations from shortness of breath, stomach pain, and back pain. She finds it challenging to perform minimal activities, such as showering, and has a history of working in daycare, Producer, television/film/video, and restaurants.   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 Smoking history:  Social History   Occupational History   Occupation: disability  Tobacco Use   Smoking status: Former    Current packs/day: 0.00    Average packs/day: 2.0 packs/day for 46.0 years (92.0 ttl pk-yrs)    Types: E-cigarettes, Cigarettes    Start date: 34    Quit date: 2022    Years since quitting: 3.4   Smokeless tobacco: Current   Tobacco comments:    Patient uses vape  Vaping Use   Vaping status: Not on file  Substance and Sexual Activity   Alcohol  use: No   Drug use: No   Sexual activity: Never    Relevant family history:  Family History  Problem Relation Age of Onset   Stroke Mother    Cancer - Lung Father    Heart attack Sister    Alcohol  abuse Sister    Hypertension Brother    Emphysema Paternal Grandfather    Breast cancer Maternal Aunt     Past Medical History:  Diagnosis Date   Abdominal pain 01/12/2019   Abdominal pain, generalized 07/10/2014   Acute on chronic respiratory failure with hypoxia ([name]) 10/18/2018   Anxiety    Arthritis    Ascites 05/01/2019   NALD   Asthma    CAD (coronary artery disease)    cath 2010 with 60-70% left Cx and normal LVF   Chronic low back pain with left-sided sciatica 10/15/2015   Chronic pain    Chronic prescription benzodiazepine use 05/01/2019   Chronic, continuous use of opioids 05/01/2019   Cirrhosis of liver not due to alcohol  ([name]) 05/01/2019    Cirrhosis of liver: per CT 07/11/2014   Constipation by delayed colonic transit 12/09/2014   COPD (chronic obstructive pulmonary disease) ([name])    COPD exacerbation ([name]) 10/12/2016   Dehiscence of surgical wound left knee 03/16/2013   Depression    Depression with anxiety 01/11/2019   Deviated septum    Diabetes mellitus without complication ([name])    type 2   Generalized abdominal pain 12/09/2014   GERD (gastroesophageal reflux disease)    Headache(784.0)    migraines   Hepatic cirrhosis ([name]) 06/2014   HLD (hyperlipidemia) 01/11/2019   Hypercholesterolemia    Hypertension    Hypertension associated with diabetes ([name])    Ileus ([name]) 07/11/2014   Influenza A 10/2018   Lobar pneumonia ([name])    Microcytic anemia 01/11/2019   Migraine headache 12/09/2014   Morbid obesity ([name]) 03/09/2013   Obesity    OSA (obstructive sleep apnea) 01/11/2019   Pars defect with spondylolisthesis 05/01/2019   Perforated bowel ([name])    PONV (postoperative nausea and vomiting)    Respiratory failure, acute-on-chronic ([name]) 12/11/2010   S/P left PF UKR 03/07/2013   Seizures ([name])    as a little girl 63 years old   Shortness of breath    Sleep apnea    dont wear bipap . lost weight   Spondylolisthesis, grade 2    Tobacco abuse    Type 2 diabetes mellitus with obesity ([name]) 01/11/2019    Past Surgical History:  Procedure Laterality Date   ABDOMINAL EXPLORATION SURGERY     primary repair of colon perforation from [facility]   ABDOMINAL HYSTERECTOMY     total   BACK SURGERY     lumbar   COLONOSCOPY WITH PROPOFOL  N/A 10/04/2019   Procedure: COLONOSCOPY WITH PROPOFOL ;  Surgeon: Baldo Bonds, MD;  Location: [facility];  Service: Endoscopy;  Laterality: N/A;   ESOPHAGOGASTRODUODENOSCOPY (EGD) WITH PROPOFOL  N/A 10/29/2016   Procedure: ESOPHAGOGASTRODUODENOSCOPY (EGD) WITH PROPOFOL ;  Surgeon: Jolinda Necessary, MD;  Location: [facility];  Service: Endoscopy;  Laterality: N/A;   ESOPHAGOGASTRODUODENOSCOPY (EGD) WITH  PROPOFOL  N/A 10/04/2019   Procedure: ESOPHAGOGASTRODUODENOSCOPY (EGD) WITH PROPOFOL ;  Surgeon: Baldo Bonds, MD;  Location: [facility];  Service: Endoscopy;  Laterality: N/A;   ESOPHAGOGASTRODUODENOSCOPY (EGD) WITH PROPOFOL  N/A 02/03/2023   Procedure: ESOPHAGOGASTRODUODENOSCOPY (EGD) WITH PROPOFOL ;  Surgeon: Baldo Bonds, MD;  Location: [facility];  Service: Gastroenterology;  Laterality: N/A;   flank ablation     IR PARACENTESIS  01/12/2019   IR PARACENTESIS  05/02/2019   IR PARACENTESIS  08/03/2019   IR PARACENTESIS  08/12/2022   IR PARACENTESIS  08/14/2022   IR PARACENTESIS  07/16/2023   IR PARACENTESIS  09/17/2023   IRRIGATION AND DEBRIDEMENT KNEE Left 03/14/2013   Procedure: IRRIGATION AND DEBRIDEMENT  and Closure of wound left KNEE;  Surgeon: Bevin Bucks, MD;  Location: [facility];  Service: Orthopedics;  Laterality: Left;   PATELLA-FEMORAL  ARTHROPLASTY Left 03/07/2013   Procedure: LEFT PATELLA-FEMORAL ARTHROPLASTY;  Surgeon: Bevin Bucks, MD;  Location: [facility];  Service: Orthopedics;  Laterality: Left;   SAVORY DILATION N/A 10/29/2016   Procedure: SAVORY DILATION;  Surgeon: Jolinda Necessary, MD;  Location: [facility];  Service: Endoscopy;  Laterality: N/A;     Physical Exam: Blood pressure 112/63, pulse 66, height 5' (1.524 m), weight 216 lb 3.2 oz (98.1 kg), SpO2 95%. Gen:      No acute distress, chromically ill appearing ENT:  no nasal polyps, mucus membranes moist Lungs:    No increased respiratory effort, symmetric chest wall excursion, clear to auscultation bilaterally, no wheezes or crackles CV:         Regular rate and rhythm; no murmurs, rubs, or gallops.  No pedal edema Abd:      + bowel sounds; soft, non-tender; no distension MSK: no acute synovitis of DIP or PIP joints, no mechanics hands.  Skin:      Warm and dry; no rashes Neuro: normal speech, no focal facial asymmetry Psych: alert and oriented x3, normal mood and affect   Data Reviewed/Medical Decision  Making:  Independent interpretation of tests: Imaging:  Review of patient's CT Chest March 2025 shows diffuse patchy ground glass opacities, no clear fibrosis or honeycombing. The patient's images have been independently reviewed by me.    PFTs: I have personally reviewed the patient's PFTs and      No data to display          Labs:  Lab Results  Component Value Date   WBC 6.8 11/28/2023   HGB 10.8 (L) 11/28/2023   HCT 35.5 (L) 11/28/2023   MCV 81.2 11/28/2023   PLT 188 11/28/2023   Lab Results  Component Value Date   NA 135 11/28/2023   K 3.6 11/28/2023   CO2 29 11/28/2023   GLUCOSE 119 (H) 11/28/2023   BUN 10 11/28/2023   CREATININE 0.73 11/28/2023   CALCIUM 8.6 (L) 11/28/2023   GFRNONAA >60 11/28/2023      Immunization status:  Immunization History  Administered Date(s) Administered   Moderna Sars-Covid-2 Vaccination 12/14/2019, 01/11/2020   Pneumococcal Polysaccharide-23 03/15/2013   Td 04/19/2020     I reviewed prior external note(s) from ED, hospital stay  I reviewed the result(s) of the labs and imaging as noted above.   I have ordered PFT, CT Chest    Assessment and Plan Assessment & Plan Chronic Obstructive Pulmonary Disease (COPD) Chronic lung disease likely due to smoking, presenting with dyspnea and hypoxemia. Differential includes COPD and post-COVID lung changes. Further evaluation needed for severity and management. - Order pulmonary function tests to assess lung function and determine COPD severity. - Continue albuterol  nebulizer treatment and inhaler as needed. - Evaluate for home oxygen  therapy based on oxygen  saturation levels during ambulation. - qualified for home oxygen  2LNC POC, prescribed today  COVID-19 with lung changes Severe COVID-19 infection with subsequent lung changes on CT scan. Concern for potential residual lung scarring or unresolved changes. - Order high-resolution CT scan of the chest to assess for resolution or  persistence of COVID-related lung changes.     Return to Care: Return in about 6 weeks (around 04/14/2024).  Louie Rover, MD Pulmonary and Critical Care Medicine [facility] [phone]  CC: Roselind Congo, MD

## 2024-03-06 DIAGNOSIS — J9621 Acute and chronic respiratory failure with hypoxia: Secondary | ICD-10-CM | POA: Diagnosis not present

## 2024-03-06 DIAGNOSIS — J9611 Chronic respiratory failure with hypoxia: Secondary | ICD-10-CM | POA: Diagnosis not present

## 2024-03-06 DIAGNOSIS — I2510 Atherosclerotic heart disease of native coronary artery without angina pectoris: Secondary | ICD-10-CM | POA: Diagnosis not present

## 2024-03-06 DIAGNOSIS — J9622 Acute and chronic respiratory failure with hypercapnia: Secondary | ICD-10-CM | POA: Diagnosis not present

## 2024-03-07 DIAGNOSIS — M7072 Other bursitis of hip, left hip: Secondary | ICD-10-CM | POA: Diagnosis not present

## 2024-03-07 DIAGNOSIS — M7918 Myalgia, other site: Secondary | ICD-10-CM | POA: Diagnosis not present

## 2024-03-07 DIAGNOSIS — M47816 Spondylosis without myelopathy or radiculopathy, lumbar region: Secondary | ICD-10-CM | POA: Diagnosis not present

## 2024-03-07 DIAGNOSIS — M47814 Spondylosis without myelopathy or radiculopathy, thoracic region: Secondary | ICD-10-CM | POA: Diagnosis not present

## 2024-03-14 ENCOUNTER — Ambulatory Visit (HOSPITAL_COMMUNITY)

## 2024-03-15 ENCOUNTER — Ambulatory Visit (HOSPITAL_COMMUNITY)

## 2024-03-15 DIAGNOSIS — M545 Low back pain, unspecified: Secondary | ICD-10-CM | POA: Diagnosis not present

## 2024-03-16 ENCOUNTER — Ambulatory Visit (HOSPITAL_COMMUNITY)
Admission: RE | Admit: 2024-03-16 | Discharge: 2024-03-16 | Disposition: A | Discharge status: Home / Self Care | Source: Ambulatory Visit | Attending: Internal Medicine | Admitting: Internal Medicine

## 2024-03-16 DIAGNOSIS — R0602 Shortness of breath: Secondary | ICD-10-CM | POA: Diagnosis not present

## 2024-03-16 DIAGNOSIS — U071 COVID-19: Secondary | ICD-10-CM | POA: Diagnosis present

## 2024-03-16 DIAGNOSIS — R9389 Abnormal findings on diagnostic imaging of other specified body structures: Secondary | ICD-10-CM | POA: Diagnosis not present

## 2024-03-16 DIAGNOSIS — R918 Other nonspecific abnormal finding of lung field: Secondary | ICD-10-CM | POA: Diagnosis not present

## 2024-03-16 DIAGNOSIS — R0600 Dyspnea, unspecified: Secondary | ICD-10-CM | POA: Diagnosis not present

## 2024-03-17 ENCOUNTER — Ambulatory Visit
Admission: RE | Admit: 2024-03-17 | Discharge: 2024-03-17 | Disposition: A | Discharge status: Home / Self Care | Source: Ambulatory Visit | Attending: Family Medicine | Admitting: Family Medicine

## 2024-03-17 DIAGNOSIS — G8929 Other chronic pain: Secondary | ICD-10-CM | POA: Diagnosis not present

## 2024-03-17 DIAGNOSIS — M545 Low back pain, unspecified: Secondary | ICD-10-CM | POA: Diagnosis not present

## 2024-03-17 DIAGNOSIS — M4316 Spondylolisthesis, lumbar region: Secondary | ICD-10-CM | POA: Diagnosis not present

## 2024-03-17 DIAGNOSIS — Z79899 Other long term (current) drug therapy: Secondary | ICD-10-CM | POA: Diagnosis not present

## 2024-03-17 DIAGNOSIS — R109 Unspecified abdominal pain: Secondary | ICD-10-CM | POA: Diagnosis not present

## 2024-03-17 DIAGNOSIS — M2550 Pain in unspecified joint: Secondary | ICD-10-CM | POA: Diagnosis not present

## 2024-03-17 DIAGNOSIS — J449 Chronic obstructive pulmonary disease, unspecified: Secondary | ICD-10-CM | POA: Diagnosis not present

## 2024-03-17 DIAGNOSIS — M48061 Spinal stenosis, lumbar region without neurogenic claudication: Secondary | ICD-10-CM | POA: Diagnosis not present

## 2024-03-17 DIAGNOSIS — K7460 Unspecified cirrhosis of liver: Secondary | ICD-10-CM | POA: Diagnosis not present

## 2024-03-17 DIAGNOSIS — M5416 Radiculopathy, lumbar region: Secondary | ICD-10-CM

## 2024-03-17 DIAGNOSIS — M542 Cervicalgia: Secondary | ICD-10-CM | POA: Diagnosis not present

## 2024-03-17 MED ORDER — GADOPICLENOL 0.5 MMOL/ML IV SOLN
10.0000 mL | Freq: Once | INTRAVENOUS | Status: AC | PRN
  Administered 2024-03-17: 10 mL via INTRAVENOUS

## 2024-03-22 ENCOUNTER — Ambulatory Visit: Payer: Self-pay | Admitting: Internal Medicine

## 2024-03-23 ENCOUNTER — Other Ambulatory Visit (HOSPITAL_COMMUNITY): Payer: Self-pay | Admitting: Gastroenterology

## 2024-03-23 DIAGNOSIS — R140 Abdominal distension (gaseous): Secondary | ICD-10-CM

## 2024-03-23 DIAGNOSIS — K7460 Unspecified cirrhosis of liver: Secondary | ICD-10-CM

## 2024-03-23 DIAGNOSIS — Z79899 Other long term (current) drug therapy: Secondary | ICD-10-CM | POA: Diagnosis not present

## 2024-03-24 ENCOUNTER — Ambulatory Visit (HOSPITAL_COMMUNITY)
Admission: RE | Admit: 2024-03-24 | Discharge: 2024-03-24 | Disposition: A | Discharge status: Home / Self Care | Source: Ambulatory Visit | Attending: Gastroenterology | Admitting: Gastroenterology

## 2024-03-24 ENCOUNTER — Ambulatory Visit: Attending: Cardiology | Admitting: Cardiology

## 2024-03-24 ENCOUNTER — Encounter: Payer: Self-pay | Admitting: Cardiology

## 2024-03-24 ENCOUNTER — Other Ambulatory Visit (HOSPITAL_COMMUNITY): Payer: Self-pay | Admitting: Gastroenterology

## 2024-03-24 VITALS — BP 102/64 | HR 85 | Ht 60.0 in | Wt 206.8 lb

## 2024-03-24 DIAGNOSIS — R600 Localized edema: Secondary | ICD-10-CM | POA: Diagnosis not present

## 2024-03-24 DIAGNOSIS — E1169 Type 2 diabetes mellitus with other specified complication: Secondary | ICD-10-CM | POA: Diagnosis not present

## 2024-03-24 DIAGNOSIS — J449 Chronic obstructive pulmonary disease, unspecified: Secondary | ICD-10-CM | POA: Diagnosis not present

## 2024-03-24 DIAGNOSIS — J9611 Chronic respiratory failure with hypoxia: Secondary | ICD-10-CM | POA: Diagnosis not present

## 2024-03-24 DIAGNOSIS — R140 Abdominal distension (gaseous): Secondary | ICD-10-CM

## 2024-03-24 DIAGNOSIS — I2510 Atherosclerotic heart disease of native coronary artery without angina pectoris: Secondary | ICD-10-CM

## 2024-03-24 DIAGNOSIS — R188 Other ascites: Secondary | ICD-10-CM | POA: Insufficient documentation

## 2024-03-24 DIAGNOSIS — K7460 Unspecified cirrhosis of liver: Secondary | ICD-10-CM

## 2024-03-24 DIAGNOSIS — I10 Essential (primary) hypertension: Secondary | ICD-10-CM

## 2024-03-24 DIAGNOSIS — E785 Hyperlipidemia, unspecified: Secondary | ICD-10-CM | POA: Diagnosis not present

## 2024-03-24 MED ORDER — LIDOCAINE HCL 1 % IJ SOLN
INTRAMUSCULAR | Status: AC
  Filled 2024-03-24: qty 20

## 2024-03-24 NOTE — Patient Instructions (Signed)
 Medication Instructions:   Your physician recommends that you continue on your current medications as directed. Please refer to the Current Medication list given to you today.  *If you need a refill on your cardiac medications before your next appointment, please call your pharmacy*   Lab Work:  TODAY--LIPIDS AND PRO-BNP--DOWNSTAIRS FIRST FLOOR OF THIS BUILDING AT [facility]  If you have labs (blood work) drawn today and your tests are completely normal, you will receive your results only by: MyChart Message (if you have MyChart) OR A paper copy in the mail If you have any lab test that is abnormal or we need to change your treatment, we will call you to review the results.   Testing/Procedures:  Your physician has requested that you have an echocardiogram. Echocardiography is a painless test that uses sound waves to create images of your heart. It provides your doctor with information about the size and shape of your heart and how well your heart's chambers and valves are working. This procedure takes approximately one hour. There are no restrictions for this procedure. Please do NOT wear cologne, perfume, aftershave, or lotions (deodorant is allowed). Please arrive 15 minutes prior to your appointment time.  Please note: We ask at that you not bring children with you during ultrasound (echo/ vascular) testing. Due to room size and safety concerns, children are not allowed in the ultrasound rooms during exams. Our front office staff cannot provide observation of children in our lobby area while testing is being conducted. An adult accompanying a patient to their appointment will only be allowed in the ultrasound room at the discretion of the ultrasound technician under special circumstances. We apologize for any inconvenience.   Follow-Up: At [facility], you and your health needs are our priority.  As part of our continuing mission to provide you with exceptional heart care, our  providers are all part of one team.  This team includes your primary Cardiologist (physician) and Advanced Practice Providers or APPs (Physician Assistants and Nurse Practitioners) who all work together to provide you with the care you need, when you need it.  Your next appointment:   6 month(s)  Provider:   DR. WILBERT BIHARI

## 2024-03-24 NOTE — Progress Notes (Signed)
 Cardiology Office Note:   Date:  03/24/2024  ID:  [name] 1960-11-19, MRN [id number] PCP: Arloa Elsie SAUNDERS, MD  [facility] Providers Cardiologist:  Wilbert Bihari, MD    History of Present Illness:   Discussed the use of AI scribe software for clinical note transcription with the patient, who gave verbal consent to proceed.  History of Present Illness [name] is a 63 y.o. female with history of nonobstructive CAD, hypertension, hyperlipidemia, COPD, diabetes mellitus, NASH with liver cirrhosis  who presents with swelling in her legs and feet. She was referred by her primary care physician to re-establish care with a cardiologist.  She has experienced significant swelling in her feet and legs, particularly the right leg, which began about five to six days ago. The swelling is severe enough at times to leave indentations when pressure is applied. She has been taking 40 mg of Lasix  daily, sometimes doubling the dose when fluid retention is severe, which has provided some relief. She forgot to take her dose yesterday. She is scheduled for a paracentesis later today, which she undergoes approximately every four months.  She has a history of coronary artery disease and recalls being told in the past that she had significant blockage, though she is unsure of the specifics. Nonobstructive moderate CAD with 60 to 70% left circumflex stenosis by cardiac cath in 2010. She has not experienced significant chest pressure or pain, but does report occasional symptoms resembling heartburn, sometimes accompanied by vomiting, as occurred this morning.  She experiences shortness of breath and low oxygen  levels, which she manages with supplemental oxygen  at home as needed, though she does not use it as often as she should due to discomfort. No palpitations, but she reports occasional dizziness, which she attributes to either pain medication or low oxygen  levels.  Her blood pressure has been  stable, though she notes fluctuations that require medication adjustments approximately once a year. She has lost about 40 pounds recently. She vomited her blood pressure medication this morning due to poor digestion related to her cirrhosis.  Studies Reviewed:    EKG:   EKG Interpretation Date/Time:  Thursday March 24 2024 08:49:19 EDT Ventricular Rate:  85 PR Interval:  118 QRS Duration:  84 QT Interval:  410 QTC Calculation: 487 R Axis:   89  Text Interpretation: Normal sinus rhythm Nonspecific T wave abnormality Prolonged QT When compared with ECG of 28-Nov-2023 23:25, PREVIOUS ECG IS PRESENT Confirmed by Trudy Birmingham [phone]) on 03/24/2024 9:19:15 AM      Risk Assessment/Calculations:              Physical Exam:   VS:  BP 102/64   Pulse 85   Ht 5' (1.524 m)   Wt 206 lb 12.8 oz (93.8 kg)   SpO2 (!) 87%   BMI 40.39 kg/m    Wt Readings from Last 3 Encounters:  03/24/24 206 lb 12.8 oz (93.8 kg)  03/03/24 216 lb 3.2 oz (98.1 kg)  11/28/23 217 lb (98.4 kg)     Physical Exam Musculoskeletal:     Right lower leg: Edema (1+ in foot only) present.     Left lower leg: Edema (trace in foot only) present.       ASSESSMENT AND PLAN:    Assessment & Plan Coronary artery disease Nonobstructive moderate CAD with 60 to 70% left circumflex stenosis by cardiac cath in 2010. No significant chest pain or discomfort. Symptoms do not suggest significant progression. - Arrange  echocardiogram to assess cardiac function - Consult with Dr. Shlomo regarding the need for coronary CTA - Order BNP to assess cardiac stretch due to fluid - Not currently on ASA due to daily Mobic .  - Continue statin, update lipid panel today.  Hypertension Blood pressure is well-controlled today despite vomiting antihypertensive medication this morning. History of fluctuating blood pressure requiring periodic adjustments in medication. - Continue current antihypertensive regimen  NASH with liver  cirrhosis and ascites Liver cirrhosis with ascites managed with diuretics. Recent increase in leg swelling, likely related to liver disease. Scheduled for paracentesis. Liver function tests from March were stable. Considering liver as the primary cause of swelling, but cardiac function will be assessed to rule out heart-related causes. - Continue diuretics as prescribed - Proceed with scheduled paracentesis - Monitor liver function tests  Chronic obstructive pulmonary disease (COPD) Worsening dyspnea over the last four years. Oxygen  saturation is low. She uses home oxygen  as needed but finds it uncomfortable. - Recheck oxygen  saturation before discharge - Encourage use of home oxygen  as needed  Type 2 diabetes mellitus Diabetes managed with glucose tablets.             Signed, Artist Pouch, PA-C

## 2024-03-24 NOTE — Progress Notes (Signed)
  IR BRIEF PROGRESS NOTE:   I was present during limited US  of the abdomen to evaluate for ascites. There is minimal peritoneal fluid with numerous mobile loops of bowel.  There is no adequate pocket of fluid to safely proceed with paracentesis.   Electronically Signed: Carlin DELENA Griffon, PA-C 03/24/2024, 3:33 PM

## 2024-03-25 ENCOUNTER — Ambulatory Visit: Payer: Self-pay | Admitting: Cardiology

## 2024-03-25 ENCOUNTER — Telehealth: Payer: Self-pay | Admitting: Cardiology

## 2024-03-25 DIAGNOSIS — I2510 Atherosclerotic heart disease of native coronary artery without angina pectoris: Secondary | ICD-10-CM

## 2024-03-25 LAB — LIPID PANEL
Chol/HDL Ratio: 2.6 ratio (ref 0.0–4.4)
Cholesterol, Total: 134 mg/dL (ref 100–199)
HDL: 51 mg/dL
LDL Chol Calc (NIH): 67 mg/dL (ref 0–99)
Triglycerides: 85 mg/dL (ref 0–149)
VLDL Cholesterol Cal: 16 mg/dL (ref 5–40)

## 2024-03-25 LAB — PRO B NATRIURETIC PEPTIDE: NT-Pro BNP: 57 pg/mL (ref 0–287)

## 2024-03-25 NOTE — Telephone Encounter (Signed)
   As discussed with patient during her visit, I reached out to Dr. Shlomo regarding symptoms/if further testing indicated. She recommends a stress PET CT to rule out ischemia in vessel previously seen with borderline obstruction/stenosis. She also recommends that patient start daily ASA 81mg  in order to protect her heart. Because this medication interacts with her Mobic , needs to follow up with her PCP to discuss alternative options.   Artist Pouch, PA-C

## 2024-03-28 ENCOUNTER — Encounter: Payer: Self-pay | Admitting: *Deleted

## 2024-03-28 MED ORDER — ASPIRIN 81 MG PO TBEC: 81.0000 mg | DELAYED_RELEASE_TABLET | Freq: Every day | ORAL | Status: AC

## 2024-03-28 NOTE — Telephone Encounter (Signed)
 Patient calling back to speak with Ivy, asked for c/b in the morning

## 2024-03-28 NOTE — Addendum Note (Signed)
 Addended by: GLADIS PORTER HERO on: 03/28/2024 05:36 PM   Modules accepted: Orders

## 2024-03-28 NOTE — Telephone Encounter (Signed)
 Patient is returning phone call.

## 2024-03-28 NOTE — Telephone Encounter (Signed)
 Left the pt a message to call the office back to endorse recommendations per Artist Pouch PA-C and Dr. Shlomo.

## 2024-03-28 NOTE — Telephone Encounter (Signed)
 Was able to make contact with the pt.   Pt aware we will order for her to get a Cardiac PET Scan done to rule out ischemia in vessel and for known CAD.   Pt also aware that Dr. Shlomo and Artist Pouch PA-C recommended  that she start taking ASA 81 mg po daily to protect her heart.  Pt aware that she will have to stop taking Mobic  for ASA interacts with this medication and she will need to follow-up with her PCP to further discuss alternative options. Pt states she hasn't had to resort in taking her mobic  in about a week or 2, but will get an appt with her PCP soon to further discuss alternative options.   Pt aware I will place the order for the Cardiac PET in the system and send a message to that scheduling dept to reach out to her soon, to arrange this appt.   Pt request that I mail the instructions for the Cardiac PET to her home mailing address, for she reports she doesn't like using her mychart account. Will mail instructions to her mailing address as requested.   Will pend attestation order in this encounter for Artist Pouch PA-C to sign off on.   Pt verbalized understanding and agrees with this plan.         Please report to Radiology at the [facility] Main Entrance 30 minutes early for your test.  [address] [place], [place] [address]                           How to Prepare for Your Cardiac PET/CT Stress Test:  Nothing to eat or drink, except water, 3 hours prior to arrival time.  NO caffeine /decaffeinated products, or chocolate 12 hours prior to arrival. (Please note decaffeinated beverages (teas/coffees) still contain caffeine ).  If you have caffeine  within 12 hours prior, the test will need to be rescheduled.  Medication instructions:  Do not take nitrates (isosorbide mononitrate, Ranexa) the day before or day of test   Diabetic Preparation: If able to eat breakfast prior to 3 hour fasting, you may take all medications, including your insulin . Do not  worry if you miss your breakfast dose of insulin  - start at your next meal. If you do not eat prior to 3 hour fast-Hold all diabetes (oral and insulin ) medications. Patients who wear a continuous glucose monitor MUST remove the device prior to scanning.  You may take your remaining medications with water.  NO perfume, cologne or lotion on chest or abdomen area. FEMALES - Please avoid wearing dresses to this appointment.  Total time is 1 to 2 hours; you may want to bring reading material for the waiting time.  IF YOU THINK YOU MAY BE PREGNANT, OR ARE NURSING PLEASE INFORM THE TECHNOLOGIST.  In preparation for your appointment, medication and supplies will be purchased.  Appointment availability is limited, so if you need to cancel or reschedule, please call the Radiology Department Scheduler at [phone] 24 hours in advance to avoid a cancellation fee of $100.00  What to Expect When you Arrive:  Once you arrive and check in for your appointment, you will be taken to a preparation room within the Radiology Department.  A technologist or Nurse will obtain your medical history, verify that you are correctly prepped for the exam, and explain the procedure.  Afterwards, an IV will be started in your arm and electrodes will be  placed on your skin for EKG monitoring during the stress portion of the exam. Then you will be escorted to the PET/CT scanner.  There, staff will get you positioned on the scanner and obtain a blood pressure and EKG.  During the exam, you will continue to be connected to the EKG and blood pressure machines.  A small, safe amount of a radioactive tracer will be injected in your IV to obtain a series of pictures of your heart along with an injection of a stress agent.    After your Exam:  It is recommended that you eat a meal and drink a caffeinated beverage to counter act any effects of the stress agent.  Drink plenty of fluids for the remainder of the day and urinate  frequently for the first couple of hours after the exam.  Your doctor will inform you of your test results within 7-10 business days.  For more information and frequently asked questions, please visit our website: [email or web address]  For questions about your test or how to prepare for your test, please call: Cardiac Imaging Nurse Navigators Office: [phone]

## 2024-03-29 NOTE — Addendum Note (Signed)
 Addended by: TRUDY BIRMINGHAM on: 03/29/2024 02:12 PM   Modules accepted: Orders

## 2024-03-29 NOTE — Telephone Encounter (Signed)
 Pts Cardiac PET Scan is scheduled for 06/01/24 at 0800.  Pt made aware of appt date and time by PET CT Scheduler.

## 2024-04-05 ENCOUNTER — Other Ambulatory Visit (HOSPITAL_BASED_OUTPATIENT_CLINIC_OR_DEPARTMENT_OTHER): Payer: Self-pay

## 2024-04-05 DIAGNOSIS — J9622 Acute and chronic respiratory failure with hypercapnia: Secondary | ICD-10-CM | POA: Diagnosis not present

## 2024-04-05 DIAGNOSIS — J9621 Acute and chronic respiratory failure with hypoxia: Secondary | ICD-10-CM | POA: Diagnosis not present

## 2024-04-05 DIAGNOSIS — J9611 Chronic respiratory failure with hypoxia: Secondary | ICD-10-CM | POA: Diagnosis not present

## 2024-04-05 DIAGNOSIS — I2510 Atherosclerotic heart disease of native coronary artery without angina pectoris: Secondary | ICD-10-CM | POA: Diagnosis not present

## 2024-04-11 ENCOUNTER — Emergency Department (HOSPITAL_BASED_OUTPATIENT_CLINIC_OR_DEPARTMENT_OTHER)

## 2024-04-11 ENCOUNTER — Emergency Department (HOSPITAL_BASED_OUTPATIENT_CLINIC_OR_DEPARTMENT_OTHER)
Admission: EM | Admit: 2024-04-11 | Discharge: 2024-04-11 | Disposition: A | Discharge status: Home / Self Care | Attending: Emergency Medicine | Admitting: Emergency Medicine

## 2024-04-11 ENCOUNTER — Other Ambulatory Visit: Payer: Self-pay

## 2024-04-11 DIAGNOSIS — R112 Nausea with vomiting, unspecified: Secondary | ICD-10-CM | POA: Insufficient documentation

## 2024-04-11 DIAGNOSIS — R188 Other ascites: Secondary | ICD-10-CM | POA: Diagnosis not present

## 2024-04-11 DIAGNOSIS — Z7982 Long term (current) use of aspirin: Secondary | ICD-10-CM | POA: Insufficient documentation

## 2024-04-11 DIAGNOSIS — K7689 Other specified diseases of liver: Secondary | ICD-10-CM | POA: Diagnosis not present

## 2024-04-11 DIAGNOSIS — I10 Essential (primary) hypertension: Secondary | ICD-10-CM | POA: Diagnosis not present

## 2024-04-11 DIAGNOSIS — R109 Unspecified abdominal pain: Secondary | ICD-10-CM | POA: Insufficient documentation

## 2024-04-11 DIAGNOSIS — G8929 Other chronic pain: Secondary | ICD-10-CM | POA: Insufficient documentation

## 2024-04-11 DIAGNOSIS — Z79899 Other long term (current) drug therapy: Secondary | ICD-10-CM | POA: Insufficient documentation

## 2024-04-11 DIAGNOSIS — J449 Chronic obstructive pulmonary disease, unspecified: Secondary | ICD-10-CM | POA: Diagnosis not present

## 2024-04-11 DIAGNOSIS — K828 Other specified diseases of gallbladder: Secondary | ICD-10-CM | POA: Diagnosis not present

## 2024-04-11 DIAGNOSIS — E876 Hypokalemia: Secondary | ICD-10-CM | POA: Insufficient documentation

## 2024-04-11 DIAGNOSIS — K7460 Unspecified cirrhosis of liver: Secondary | ICD-10-CM | POA: Diagnosis not present

## 2024-04-11 DIAGNOSIS — E119 Type 2 diabetes mellitus without complications: Secondary | ICD-10-CM | POA: Diagnosis not present

## 2024-04-11 LAB — COMPREHENSIVE METABOLIC PANEL WITH GFR
ALT: 11 U/L (ref 0–44)
AST: 27 U/L (ref 15–41)
Albumin: 3.7 g/dL (ref 3.5–5.0)
Alkaline Phosphatase: 78 U/L (ref 38–126)
Anion gap: 11 (ref 5–15)
BUN: <5 mg/dL — ABNORMAL LOW (ref 8–23)
CO2: 27 mmol/L (ref 22–32)
Calcium: 9 mg/dL (ref 8.9–10.3)
Chloride: 103 mmol/L (ref 98–111)
Creatinine, Ser: 0.75 mg/dL (ref 0.44–1.00)
GFR, Estimated: >60 mL/min (ref >60)
Glucose, Bld: 87 mg/dL (ref 70–99)
Potassium: 3.4 mmol/L — ABNORMAL LOW (ref 3.5–5.1)
Sodium: 141 mmol/L (ref 135–145)
Total Bilirubin: 0.6 mg/dL (ref 0.0–1.2)
Total Protein: 7.6 g/dL (ref 6.5–8.1)

## 2024-04-11 LAB — CBC WITH DIFFERENTIAL/PLATELET
Abs Immature Granulocytes: 0.02 K/uL (ref 0.00–0.07)
Basophils Absolute: 0 K/uL (ref 0.0–0.1)
Basophils Relative: 0 %
Eosinophils Absolute: 0.1 K/uL (ref 0.0–0.5)
Eosinophils Relative: 2 %
HCT: 41.1 % (ref 36.0–46.0)
Hemoglobin: 13.5 g/dL (ref 12.0–15.0)
Immature Granulocytes: 0 %
Lymphocytes Relative: 24 %
Lymphs Abs: 1.4 K/uL (ref 0.7–4.0)
MCH: 28.1 pg (ref 26.0–34.0)
MCHC: 32.8 g/dL (ref 30.0–36.0)
MCV: 85.4 fL (ref 80.0–100.0)
Monocytes Absolute: 0.2 K/uL (ref 0.1–1.0)
Monocytes Relative: 4 %
Neutro Abs: 4 K/uL (ref 1.7–7.7)
Neutrophils Relative %: 70 %
Platelets: 124 K/uL — ABNORMAL LOW (ref 150–400)
RBC: 4.81 MIL/uL (ref 3.87–5.11)
RDW: 15.6 % — ABNORMAL HIGH (ref 11.5–15.5)
WBC: 5.7 K/uL (ref 4.0–10.5)
nRBC: 0 % (ref 0.0–0.2)

## 2024-04-11 LAB — URINALYSIS, ROUTINE W REFLEX MICROSCOPIC
Bilirubin Urine: NEGATIVE
Glucose, UA: NEGATIVE mg/dL
Hgb urine dipstick: NEGATIVE
Ketones, ur: NEGATIVE mg/dL
Leukocytes,Ua: NEGATIVE
Nitrite: NEGATIVE
Protein, ur: NEGATIVE mg/dL
Specific Gravity, Urine: 1.01 (ref 1.005–1.030)
pH: 5.5 (ref 5.0–8.0)

## 2024-04-11 LAB — PROTIME-INR
INR: 1.2 (ref 0.8–1.2)
Prothrombin Time: 16 s — ABNORMAL HIGH (ref 11.4–15.2)

## 2024-04-11 LAB — LIPASE, BLOOD: Lipase: 42 U/L (ref 11–51)

## 2024-04-11 MED ORDER — METOCLOPRAMIDE HCL 5 MG/ML IJ SOLN
10.0000 mg | Freq: Once | INTRAMUSCULAR | Status: AC
  Administered 2024-04-11: 10 mg via INTRAVENOUS
  Filled 2024-04-11: qty 2

## 2024-04-11 MED ORDER — LACTATED RINGERS IV BOLUS
500.0000 mL | Freq: Once | INTRAVENOUS | Status: AC
  Administered 2024-04-11: 500 mL via INTRAVENOUS

## 2024-04-11 MED ORDER — OXYCODONE HCL 5 MG PO TABS
20.0000 mg | ORAL_TABLET | Freq: Once | ORAL | Status: AC
  Administered 2024-04-11: 20 mg via ORAL
  Filled 2024-04-11: qty 4

## 2024-04-11 MED ORDER — DIPHENHYDRAMINE HCL 50 MG/ML IJ SOLN
12.5000 mg | Freq: Once | INTRAMUSCULAR | Status: AC
  Administered 2024-04-11: 12.5 mg via INTRAVENOUS
  Filled 2024-04-11: qty 1

## 2024-04-11 MED ORDER — IOHEXOL 300 MG/ML  SOLN
100.0000 mL | Freq: Once | INTRAMUSCULAR | Status: AC | PRN
Start: 2024-04-11 — End: 2024-04-11
  Administered 2024-04-11: 100 mL via INTRAVENOUS

## 2024-04-11 MED ORDER — LORAZEPAM 2 MG/ML IJ SOLN
0.5000 mg | Freq: Once | INTRAMUSCULAR | Status: AC
  Administered 2024-04-11: 0.5 mg via INTRAVENOUS
  Filled 2024-04-11: qty 1

## 2024-04-11 NOTE — ED Notes (Signed)
 Patient transported to CT

## 2024-04-11 NOTE — ED Triage Notes (Signed)
 C/o n/v x 6 days. States has chronic abd pain but has been unable to keep meds down d/t vomiting. Denies fevers

## 2024-04-11 NOTE — ED Provider Notes (Signed)
 [place] EMERGENCY DEPARTMENT AT [facility] Provider Note   CSN: [id number] Arrival date & time: 04/11/24  1525     Patient presents with: Emesis   [name] is a 63 y.o. female cirrhosis from [place], chronic abdominal pain, COPD, type 2 diabetes, hypertension presents to the emerged from today for evaluation of uncontrolled nausea and vomiting.  The patient reports that she has been having nausea and vomiting for the past 7 days.  She has tried Compazine , Zofran , Phenergan  all without decrease in symptoms.  She reports that she is able to keep some liquids down but is not able to keep down solids.  She denies any diarrhea, constipation, dysuria, hematuria, fever.  Denies any black or bloody emesis.  She reports that she feels that her pain has been worse given that she is not able to tolerate her pain medication.  Describes her abdominal pain is burning.  She does not feel more bloated than at her baseline.  She reports that her last paracentesis was around 4 months prior.  Emesis Associated symptoms: abdominal pain   Associated symptoms: no chills, no diarrhea and no fever        Prior to Admission medications   Medication Sig Start Date End Date Taking? Authorizing Provider  acetaminophen  (TYLENOL ) 500 MG tablet Take 500-1,000 mg by mouth every 6 (six) hours as needed for mild pain (pain score 1-3) or headache.    [provider]  albuterol  (PROVENTIL  HFA;VENTOLIN  HFA) 108 (90 BASE) MCG/ACT inhaler Inhale 2 puffs into the lungs every 6 (six) hours as needed for wheezing or shortness of breath.    [provider]  albuterol  (PROVENTIL ) (2.5 MG/3ML) 0.083% nebulizer solution Take 2.5 mg by nebulization every 6 (six) hours as needed for wheezing or shortness of breath.    [provider]  ALPRAZolam  (XANAX ) 1 MG tablet Take 1-2 mg by mouth See admin instructions. Take 2 mg by mouth at bedtime and an additional 1 mg once a day as needed for anxiety     [provider]  amLODipine  (NORVASC ) 5 MG tablet Take 5 mg by mouth daily.    [provider]  aspirin  EC 81 MG tablet Take 1 tablet (81 mg total) by mouth daily. Swallow whole. 03/28/24   Trudy Birmingham, PA-C  azithromycin  (ZITHROMAX ) 250 MG tablet Take 250 mg by mouth daily.    [provider]  baclofen  (LIORESAL ) 10 MG tablet Take 1 tablet (10 mg total) by mouth 3 (three) times daily. Patient taking differently: Take 10 mg by mouth 3 (three) times daily as needed for muscle spasms. 12/07/22   Tobie Yetta CHRISTELLA, MD  benazepril  (LOTENSIN ) 40 MG tablet Take 40 mg by mouth daily.    [provider]  Benzocaine-Resorcinol (VAGISIL EX) Apply 1 application topically daily.    [provider]  bisacodyl  (DULCOLAX) 10 MG suppository Place 1 suppository (10 mg total) rectally daily as needed for moderate constipation. 12/07/22   Tobie Yetta CHRISTELLA, MD  budesonide  (PULMICORT ) 1 MG/2ML nebulizer solution Take 1 mg by nebulization daily as needed (for shortness of breath- when sick). 04/08/21   [provider]  cloNIDine  (CATAPRES ) 0.2 MG tablet Take 0.2 mg by mouth daily.    [provider]  cyclobenzaprine  (FLEXERIL ) 10 MG tablet Take 10 mg by mouth 3 (three) times daily as needed for muscle spasms.    [provider]  escitalopram  (LEXAPRO ) 20 MG tablet Take 20 mg by mouth daily.  [provider]  estradiol  (ESTRACE ) 2 MG tablet Take 2 mg by mouth daily.     [provider]  fluticasone  (FLONASE ) 50 MCG/ACT nasal spray Place 1 spray into both nostrils daily as needed for allergies.    [provider]  furosemide  (LASIX ) 40 MG tablet Take 1 tablet (40 mg total) by mouth daily. Take additional 40 mg when you have a weight gain of 3 lbs in 1 day or 5 lbs in 1 week. Patient taking differently: Take 40 mg by mouth See admin instructions. Take 40 mg by mouth once a day and an additional 40 with a weight gain of 3  pounds/day or 5 pounds/week 12/08/22   Patel, Pranav M, MD  Glycerin -Polysorbate 80 (REFRESH DRY EYE THERAPY OP) Place 1 drop into both eyes 3 (three) times daily as needed (for dryness).    [provider]  glycopyrrolate  (ROBINUL ) 1 MG tablet Take 1-2 mg by mouth daily as needed (for excessive saliva).    [provider]  lactulose  (CHRONULAC ) 10 GM/15ML solution Take 15 mLs by mouth 2 (two) times daily as needed for mild constipation. 10/22/22   [provider]  levocetirizine (XYZAL ) 5 MG tablet Take 5 mg by mouth every evening.  09/16/19   [provider]  lidocaine  (LIDODERM ) 5 % Place 1 patch onto the skin daily. Remove & Discard patch within 12 hours or as directed by MD Patient taking differently: Place 1 patch onto the skin daily as needed (pain). Remove & Discard patch within 12 hours or as directed by MD 11/07/22   Tegeler, Lonni PARAS, MD  lidocaine  (XYLOCAINE ) 5 % ointment Apply 1 application topically 2 (two) times daily as needed (hemorrhoids).    [provider]  methylPREDNISolone  (MEDROL  DOSEPAK) 4 MG TBPK tablet See instructions 01/20/24   Dreama Longs, MD  mupirocin ointment (BACTROBAN) 2 % Apply 1 Application topically 2 (two) times daily as needed (for sores in the nose). 10/27/22   [provider]  naloxone  (NARCAN ) 4 MG/0.1ML LIQD nasal spray kit Place 1 spray into the nose as needed (for overdose).     [provider]  nystatin  (MYCOSTATIN /NYSTOP ) powder Apply 1 g topically 4 (four) times daily as needed (irritation).    [provider]  ondansetron  (ZOFRAN ) 8 MG tablet Take 8 mg by mouth every 8 (eight) hours as needed for nausea or vomiting.    [provider]  oxyCODONE  (ROXICODONE ) 15 MG immediate release tablet Take 1 tablet (15 mg total) by mouth every 8 (eight) hours as needed for pain. 03/29/21   Krishnan, Sendil K, MD  Oxycodone  HCl 20 MG TABS Take 20 mg by mouth See admin instructions.  Take 20 mg by mouth every 4 hours- max of 5 tablets/24 hours    [provider]  OZEMPIC, 1 MG/DOSE, 4 MG/3ML SOPN Inject 1 mg into the skin every Monday.    [provider]  pantoprazole  (PROTONIX ) 40 MG tablet Take 40 mg by mouth 2 (two) times daily before a meal.    [provider]  PHAZYME MAXIMUM STRENGTH 250 MG CAPS Take 250 mg by mouth 2 (two) times daily as needed (for gas).    [provider]  polyethylene glycol (MIRALAX  / GLYCOLAX ) 17 g packet Take 17 g by mouth 2 (two) times daily. Patient taking differently: Take 17 g by mouth daily as needed for moderate constipation (AND HOLD FOR DIARRHEA- mix as directed). 12/07/22   Tobie Yetta HERO, MD  prochlorperazine  (COMPAZINE ) 10 MG tablet Take 10 mg by mouth every 6 (six) hours as needed for nausea or vomiting (not relieved by Zofran ).    [provider]  promethazine  (PHENERGAN ) 25 MG tablet Take 25 mg by mouth every 8 (eight) hours as needed for nausea or vomiting.    [provider]  rizatriptan (MAXALT-MLT) 10 MG disintegrating tablet Take 10 mg by mouth as needed for migraine (may repeat once in 2 hours, if no relief- dissolve orally).    [provider]  Simethicone  80 MG TABS Take 1 tablet (80 mg total) by mouth in the morning, at noon, in the evening, and at bedtime. 12/07/22   Tobie Yetta HERO, MD  simvastatin  (ZOCOR ) 20 MG tablet Take 20 mg by mouth at bedtime.    [provider]  topiramate  (TOPAMAX ) 50 MG tablet Take 50 mg by mouth daily as needed (migraines).    [provider]  traZODone  (DESYREL ) 50 MG tablet Take 100 mg by mouth at bedtime. 09/05/22   [provider]  triamcinolone  cream (KENALOG ) 0.1 % Apply 1 application  topically 2 (two) times daily as needed (for rashes).    [provider]    Allergies: Spironolactone , Bactrim [sulfamethoxazole-trimethoprim], Gabapentin, Amoxicillin , Clindamycin/lincomycin, Doxycycline ,  Influenza virus vaccine, Linzess  [linaclotide ], Metronidazole , and Treximet [sumatriptan-naproxen sodium]    Review of Systems  Constitutional:  Negative for chills and fever.  Respiratory:  Negative for shortness of breath.   Cardiovascular:  Negative for chest pain.  Gastrointestinal:  Positive for abdominal pain, nausea and vomiting. Negative for blood in stool, constipation and diarrhea.  Genitourinary:  Negative for dysuria and hematuria.    Updated Vital Signs BP (!) 150/65   Pulse 69   Temp 97.8 F (36.6 C) (Oral)   Resp 19   SpO2 95%   Physical Exam Vitals and nursing note reviewed.  Constitutional:      General: She is not in acute distress.    Appearance: She is not toxic-appearing.  HENT:     Mouth/Throat:     Mouth: Mucous membranes are dry.  Eyes:     General: No scleral icterus. Cardiovascular:     Rate and Rhythm: Normal rate.  Pulmonary:     Effort: Pulmonary effort is normal. No respiratory distress.  Abdominal:     General: Bowel sounds are normal. There is distension.     Palpations: Abdomen is soft.     Tenderness: There is abdominal tenderness. There is no guarding or rebound.     Comments: Diffuse abdominal tenderness palpation.  Abdomen is distended however still soft.  No guarding or rebound.  Normal active bowel sounds.  Skin:    General: Skin is warm and dry.  Neurological:     Mental Status: She is alert.     (all labs ordered are listed, but only abnormal results are displayed) Labs Reviewed  CBC WITH DIFFERENTIAL/PLATELET - Abnormal; Notable for the following components:      Result Value   RDW 15.6 (*)    Platelets 124 (*)    All other components within normal limits  COMPREHENSIVE METABOLIC PANEL WITH GFR - Abnormal; Notable for the following components:   Potassium 3.4 (*)    BUN <5 (*)    All other components within normal limits  PROTIME-INR - Abnormal; Notable for the following components:   Prothrombin Time 16.0 (*)    All  other components within normal limits  URINALYSIS, ROUTINE W REFLEX MICROSCOPIC  LIPASE, BLOOD  EKG: EKG Interpretation Date/Time:  Monday April 11 2024 18:20:56 EDT Ventricular Rate:  66 PR Interval:  106 QRS Duration:  92 QT Interval:  480 QTC Calculation: 503 R Axis:   108  Text Interpretation: Sinus rhythm Short PR interval Right axis deviation Low voltage, precordial leads No significant change since last tracing Confirmed by Dreama Longs ([id number]) on 04/11/2024 7:19:18 PM  Radiology: US  Abdomen Limited RUQ (LIVER/GB) Result Date: 04/11/2024 CLINICAL DATA:  Nausea, vomiting EXAM: ULTRASOUND ABDOMEN LIMITED RIGHT UPPER QUADRANT COMPARISON:  None Available. FINDINGS: Gallbladder: No gallstones or gallbladder sludge visualized. There is mild thickening of the gallbladder wall, nonspecific in the setting of underlying cirrhosis and ascites. No sonographic Murphy sign noted by sonographer. Common bile duct: Diameter: 3 mm in proximal diameter Liver: The liver contour is nodular and the hepatic parenchymal echotexture is diffusely coarsened in keeping with changes of underlying cirrhosis. No focal intrahepatic mass lesion or intrahepatic biliary ductal dilation identified on the presented images. Portal vein is patent on color Doppler imaging with normal direction of blood flow towards the liver. Other: Moderate ascites IMPRESSION: 1. Morphologic changes of cirrhosis. No focal intrahepatic mass lesion identified on the presented images. 2. Moderate ascites. 3. Mild gallbladder wall thickening, nonspecific in the setting of underlying cirrhosis and ascites. No definite sonographic evidence of acute cholecystitis. Electronically Signed   By: Dorethia Molt M.D.   On: 04/11/2024 21:56   CT ABDOMEN PELVIS W CONTRAST Result Date: 04/11/2024 CLINICAL DATA:  Acute nonlocalized abdominal pain. Nausea and vomiting for 6 days. Chronic abdominal pain. EXAM: CT ABDOMEN AND PELVIS WITH CONTRAST TECHNIQUE:  Multidetector CT imaging of the abdomen and pelvis was performed using the standard protocol following bolus administration of intravenous contrast. RADIATION DOSE REDUCTION: This exam was performed according to the departmental dose-optimization program which includes automated exposure control, adjustment of the mA and/or kV according to patient size and/or use of iterative reconstruction technique. CONTRAST:  OMNIPAQUE  IOHEXOL  300 MG/ML  SOLN COMPARISON:  CT chest abdomen pelvis 12/02/2023. CT abdomen pelvis 11/28/2023 FINDINGS: Lower chest: Minimal linear atelectasis in the lung bases. Hepatobiliary: Hepatic cirrhosis with enlarged lateral segment left lobe and nodular contour to the liver. No focal lesions are identified. Mild gallbladder wall thickening likely related to liver disease. No stones. No bile duct dilatation. Upper abdominal varices are present. Pancreas: Unremarkable. No pancreatic ductal dilatation or surrounding inflammatory changes. Spleen: Mild splenic enlargement. Low-attenuation lesions in the spleen measuring up to 1.5 cm diameter, unchanged. These are likely cysts or hemangiomas. No imaging follow-up is indicated. Adrenals/Urinary Tract: Adrenal glands are unremarkable. Kidneys are normal, without renal calculi, focal lesion, or hydronephrosis. Bladder is unremarkable. Stomach/Bowel: Stomach, small bowel, and colon are not abnormally distended. No wall thickening or inflammatory changes. Appendix is not identified. Vascular/Lymphatic: Aortic atherosclerosis. No enlarged abdominal or pelvic lymph nodes. Reproductive: Status post hysterectomy. No adnexal masses. Other: Moderate free fluid throughout the abdomen and pelvis consistent with ascites. No free air. Abdominal wall musculature appears intact. Musculoskeletal: Spondylolysis with mild spondylolisthesis at L5-S1. Degenerative changes in the spine. No acute bony abnormalities. IMPRESSION: 1. Hepatic cirrhosis with upper abdominal  varices and moderate ascites. Mild splenic enlargement. 2. No evidence of bowel obstruction or inflammation. 3. Aortic atherosclerosis. Electronically Signed   By: Elsie Gravely M.D.   On: 04/11/2024 20:23     Procedures   Medications Ordered in the ED  metoCLOPramide  (REGLAN ) injection 10 mg (10 mg Intravenous Given 04/11/24 1938)  diphenhydrAMINE  (BENADRYL ) injection 12.5 mg (  12.5 mg Intravenous Given 04/11/24 1937)  lactated ringers  bolus 500 mL (0 mLs Intravenous Stopped 04/11/24 2057)  iohexol  (OMNIPAQUE ) 300 MG/ML solution 100 mL (100 mLs Intravenous Contrast Given 04/11/24 1948)  oxyCODONE  (Oxy IR/ROXICODONE ) immediate release tablet 20 mg (20 mg Oral Given 04/11/24 2058)  LORazepam  (ATIVAN ) injection 0.5 mg (0.5 mg Intravenous Given 04/11/24 2059)  diphenhydrAMINE  (BENADRYL ) injection 12.5 mg (12.5 mg Intravenous Given 04/11/24 2058)    Medical Decision Making Amount and/or Complexity of Data Reviewed Labs: ordered. Radiology: ordered.  Risk Prescription drug management.   63 y.o. female presents to the ER for evaluation of chronic abdominal pain with uncontrolled nausea and vomiting. Differential diagnosis includes but is not limited to differential diagnosis . Vital signs mildly elevated blood pressure otherwise unremarkable. Physical exam as noted above.   Patient was given some IV fluids as well as Reglan .  Was having some adverse reactions was given additional dose of Benadryl  with this.  Her at home medication for oxycodone  was given.  Still having some nausea so Ativan  was given.  Patient now feeling her nausea is under control.  I independently reviewed and interpreted the patient's labs.  Urinalysis unremarkable.  CBC without leukocytosis or anemia.  Chronic thrombocytopenia at 124.  CMP did show mildly decreased potassium 3.4, BUN of less than 5, otherwise no other electrolyte or LFT abnormality.  PT at 16, INR within normal limits.  CT Abd Pel  1. Hepatic cirrhosis with  upper abdominal varices and moderate ascites. Mild splenic enlargement. 2. No evidence of bowel obstruction or inflammation. 3. Aortic atherosclerosis. Per radiologist's interpretation.    RUQ US  Shows 1. Morphologic changes of cirrhosis. No focal intrahepatic mass lesion identified on the presented images. 2. Moderate ascites. 3. Mild gallbladder wall thickening, nonspecific in the setting of underlying cirrhosis and ascites. No definite sonographic evidence of acute cholecystitis.  EKG reviewed and interpreted by my attending and read as Sinus rhythm Short PR interval Right axis deviation Low voltage, precordial leads No significant change since last tracing.    Patient has been controlling nausea and vomiting.  Asking for additional pain medication dose.  Is eating Jell-O at bedside.  Attending assessed at bedside, patient minimally tender with abdominal pain however her main complaint is the nausea and vomiting.  Less likely any SBP however had shared decision making with patient.  Could possibly have put her self in withdrawal given that she couldn't keep down her medications. Patient is feeling better back to be discharged given symptom improvement. She discussed return precautions with the patient. Stable for discharge home with strict return precautions and rd flag symptoms.   I discussed this case with my attending physician who cosigned this note including patient's presenting symptoms, physical exam, and planned diagnostics and interventions. Attending physician stated agreement with plan or made changes to plan which were implemented.   Attending physician assessed patient at bedside.  Portions of this report may have been transcribed using voice recognition software. Every effort was made to ensure accuracy; however, inadvertent computerized transcription errors may be present.    Final diagnoses:  Nausea and vomiting, unspecified vomiting type    ED Discharge Orders     None           Bernis Ernst, [place] 04/11/24 2304    Dreama Longs, MD 04/12/24 1037

## 2024-04-11 NOTE — ED Notes (Signed)
 ED Provider at bedside.

## 2024-04-14 ENCOUNTER — Telehealth: Payer: Self-pay

## 2024-04-14 DIAGNOSIS — M4316 Spondylolisthesis, lumbar region: Secondary | ICD-10-CM | POA: Diagnosis not present

## 2024-04-14 NOTE — Telephone Encounter (Signed)
   Pre-operative Risk Assessment    Patient Name: [name]  DOB: 1960-11-26 MRN: [id number]   Date of last office visit: 03/24/24 ARTIST POUCH, PA-C Date of next office visit: NONE   Request for Surgical Clearance    Procedure:  L5-S1 LUMBAR FUSION  Date of Surgery:  Clearance TBD                                Surgeon:  DR ARLEY HELLING Surgeon's Group or Practice Name:  [facility] Phone number:  [phone] Fax number:  [phone]   Type of Clearance Requested:   - Medical  - Pharmacy:  Hold Aspirin      Type of Anesthesia:  General    Additional requests/questions:    SignedLucie DELENA Ku   04/14/2024, 5:47 PM

## 2024-04-15 DIAGNOSIS — J449 Chronic obstructive pulmonary disease, unspecified: Secondary | ICD-10-CM | POA: Diagnosis not present

## 2024-04-15 DIAGNOSIS — K7460 Unspecified cirrhosis of liver: Secondary | ICD-10-CM | POA: Diagnosis not present

## 2024-04-15 DIAGNOSIS — Z79899 Other long term (current) drug therapy: Secondary | ICD-10-CM | POA: Diagnosis not present

## 2024-04-15 DIAGNOSIS — M542 Cervicalgia: Secondary | ICD-10-CM | POA: Diagnosis not present

## 2024-04-15 DIAGNOSIS — G8929 Other chronic pain: Secondary | ICD-10-CM | POA: Diagnosis not present

## 2024-04-15 DIAGNOSIS — M545 Low back pain, unspecified: Secondary | ICD-10-CM | POA: Diagnosis not present

## 2024-04-15 DIAGNOSIS — R109 Unspecified abdominal pain: Secondary | ICD-10-CM | POA: Diagnosis not present

## 2024-04-15 DIAGNOSIS — M2550 Pain in unspecified joint: Secondary | ICD-10-CM | POA: Diagnosis not present

## 2024-04-18 NOTE — Telephone Encounter (Signed)
 Per Katelyn Causey  I just spoke with patient and she does not want to go to [facility]. She is fine with keeping her appt for 9/17. I did add her to waitlist and told her we would call and offer to her if we have any cancellations. PET CT at [facility] is currently out of service.

## 2024-04-18 NOTE — Telephone Encounter (Signed)
**Note De-identified  [place] Obfuscation** Please advise 

## 2024-05-03 ENCOUNTER — Encounter: Payer: Self-pay | Admitting: Internal Medicine

## 2024-05-03 ENCOUNTER — Telehealth (HOSPITAL_COMMUNITY): Payer: Self-pay | Admitting: Internal Medicine

## 2024-05-03 ENCOUNTER — Ambulatory Visit (INDEPENDENT_AMBULATORY_CARE_PROVIDER_SITE_OTHER): Admitting: Internal Medicine

## 2024-05-03 ENCOUNTER — Ambulatory Visit

## 2024-05-03 VITALS — BP 124/60 | HR 73 | Temp 97.7°F | Ht 60.0 in | Wt 212.0 lb

## 2024-05-03 DIAGNOSIS — F17200 Nicotine dependence, unspecified, uncomplicated: Secondary | ICD-10-CM

## 2024-05-03 DIAGNOSIS — R0602 Shortness of breath: Secondary | ICD-10-CM | POA: Diagnosis not present

## 2024-05-03 DIAGNOSIS — F17210 Nicotine dependence, cigarettes, uncomplicated: Secondary | ICD-10-CM | POA: Diagnosis not present

## 2024-05-03 DIAGNOSIS — J9611 Chronic respiratory failure with hypoxia: Secondary | ICD-10-CM | POA: Diagnosis not present

## 2024-05-03 DIAGNOSIS — R9389 Abnormal findings on diagnostic imaging of other specified body structures: Secondary | ICD-10-CM

## 2024-05-03 LAB — PULMONARY FUNCTION TEST
DL/VA % pred: 106 %
DL/VA: 4.58 ml/min/mmHg/L
DLCO cor % pred: 89 %
DLCO cor: 15.55 ml/min/mmHg
DLCO unc % pred: 90 %
DLCO unc: 15.6 ml/min/mmHg
FEF 25-75 Post: 3.71 L/s
FEF 25-75 Pre: 3.17 L/s
FEF2575-%Change-Post: 17 %
FEF2575-%Pred-Post: 190 %
FEF2575-%Pred-Pre: 162 %
FEV1-%Change-Post: 3 %
FEV1-%Pred-Post: 90 %
FEV1-%Pred-Pre: 87 %
FEV1-Post: 1.88 L
FEV1-Pre: 1.82 L
FEV1FVC-%Change-Post: 0 %
FEV1FVC-%Pred-Pre: 115 %
FEV6-%Change-Post: 3 %
FEV6-%Pred-Post: 80 %
FEV6-%Pred-Pre: 78 %
FEV6-Post: 2.11 L
FEV6-Pre: 2.05 L
FEV6FVC-%Pred-Post: 104 %
FEV6FVC-%Pred-Pre: 104 %
FVC-%Change-Post: 3 %
FVC-%Pred-Post: 77 %
FVC-%Pred-Pre: 75 %
FVC-Post: 2.12 L
FVC-Pre: 2.05 L
Post FEV1/FVC ratio: 89 %
Post FEV6/FVC ratio: 100 %
Pre FEV1/FVC ratio: 89 %
Pre FEV6/FVC Ratio: 100 %
RV % pred: 95 %
RV: 1.78 L
TLC % pred: 88 %
TLC: 3.93 L

## 2024-05-03 NOTE — Patient Instructions (Addendum)
 It was a pleasure to see you today!  Please schedule follow up with myself in 6 months.  If my schedule is not open yet, we will contact you with a reminder closer to that time. Please call [phone] if you haven't heard from us  a month before, and always call us  sooner if issues or concerns arise. You can also send us  a message through MyChart, but but aware that this is not to be used for urgent issues and it may take up to 5-7 days to receive a reply. Please be aware that you will likely be able to view your results before I have a chance to respond to them. Please give us  5 business days to respond to any non-urgent results.    Glad to your breathing is doing okay.  Your pulmonary function testing is normal. I think you have some evidence of smoking-related lung disease even if your breathing testing is normal.  Continue taking the Pulmicort  and albuterol  nebulizer treatments as needed.  We discussed starting a daily scheduled maintenance inhaler.  Let me know if that something you in a try and the future.  Continue wearing your oxygen  for goal saturations over 2 L.  You are having an echocardiogram done tomorrow.  I am trying to work on making sure they can do an echocardiogram with a bubble study.  I have ordered this test.  Please show this paperwork to the technician doing your study tomorrow to make sure that they do this additional test in addition to your echocardiogram.  Your CT scan shows improved changes from the COVID infection you had in the spring.  There are some tiny nodules which will be followed up at your subsequent CT chest for lung cancer screening.
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
## 2024-05-03 NOTE — Telephone Encounter (Signed)
 Yes I just want to make sure that on 8/20 she receives an echo with bubble, not just a full echo. If we can make sure the tech receives the message to do both. thanks

## 2024-05-03 NOTE — Progress Notes (Signed)
 Full pft performed today

## 2024-05-03 NOTE — Patient Instructions (Signed)
 Full pft performed today

## 2024-05-03 NOTE — Telephone Encounter (Signed)
 We received an order for Echo bubble from Dr. Meade. Patient is already scheduled for Echocardiogram on 05/04/24 by cardiologist. Just wanted you to know.

## 2024-05-04 ENCOUNTER — Ambulatory Visit (HOSPITAL_COMMUNITY)
Admission: RE | Admit: 2024-05-04 | Discharge: 2024-05-04 | Disposition: A | Discharge status: Home / Self Care | Source: Ambulatory Visit | Attending: Cardiovascular Disease | Admitting: Cardiovascular Disease

## 2024-05-04 DIAGNOSIS — R0602 Shortness of breath: Secondary | ICD-10-CM | POA: Diagnosis not present

## 2024-05-04 LAB — ECHOCARDIOGRAM COMPLETE BUBBLE STUDY
AR max vel: 1.66 cm2
AV Area VTI: 1.46 cm2
AV Area mean vel: 1.45 cm2
AV Mean grad: 4 mmHg
AV Peak grad: 7.6 mmHg
Ao pk vel: 1.38 m/s
Area-P 1/2: 4.96 cm2
S' Lateral: 3.1 cm

## 2024-05-04 MED ORDER — PERFLUTREN LIPID MICROSPHERE
1.0000 mL | INTRAVENOUS | Status: AC | PRN
  Administered 2024-05-04: 3 mL via INTRAVENOUS

## 2024-05-06 ENCOUNTER — Ambulatory Visit: Payer: Self-pay | Admitting: Internal Medicine

## 2024-05-06 DIAGNOSIS — J9611 Chronic respiratory failure with hypoxia: Secondary | ICD-10-CM | POA: Diagnosis not present

## 2024-05-06 DIAGNOSIS — J9621 Acute and chronic respiratory failure with hypoxia: Secondary | ICD-10-CM | POA: Diagnosis not present

## 2024-05-06 DIAGNOSIS — I2510 Atherosclerotic heart disease of native coronary artery without angina pectoris: Secondary | ICD-10-CM | POA: Diagnosis not present

## 2024-05-06 DIAGNOSIS — J9622 Acute and chronic respiratory failure with hypercapnia: Secondary | ICD-10-CM | POA: Diagnosis not present

## 2024-05-09 DIAGNOSIS — G5702 Lesion of sciatic nerve, left lower limb: Secondary | ICD-10-CM | POA: Diagnosis not present

## 2024-05-09 DIAGNOSIS — M47816 Spondylosis without myelopathy or radiculopathy, lumbar region: Secondary | ICD-10-CM | POA: Diagnosis not present

## 2024-05-09 DIAGNOSIS — M7918 Myalgia, other site: Secondary | ICD-10-CM | POA: Diagnosis not present

## 2024-05-09 DIAGNOSIS — M7072 Other bursitis of hip, left hip: Secondary | ICD-10-CM | POA: Diagnosis not present

## 2024-05-12 ENCOUNTER — Other Ambulatory Visit: Payer: Self-pay | Admitting: Family Medicine

## 2024-05-12 DIAGNOSIS — Z1231 Encounter for screening mammogram for malignant neoplasm of breast: Secondary | ICD-10-CM

## 2024-05-13 DIAGNOSIS — R109 Unspecified abdominal pain: Secondary | ICD-10-CM | POA: Diagnosis not present

## 2024-05-13 DIAGNOSIS — J449 Chronic obstructive pulmonary disease, unspecified: Secondary | ICD-10-CM | POA: Diagnosis not present

## 2024-05-13 DIAGNOSIS — M545 Low back pain, unspecified: Secondary | ICD-10-CM | POA: Diagnosis not present

## 2024-05-13 DIAGNOSIS — G8929 Other chronic pain: Secondary | ICD-10-CM | POA: Diagnosis not present

## 2024-05-13 DIAGNOSIS — K7460 Unspecified cirrhosis of liver: Secondary | ICD-10-CM | POA: Diagnosis not present

## 2024-05-13 DIAGNOSIS — M542 Cervicalgia: Secondary | ICD-10-CM | POA: Diagnosis not present

## 2024-05-13 DIAGNOSIS — M2550 Pain in unspecified joint: Secondary | ICD-10-CM | POA: Diagnosis not present

## 2024-05-13 DIAGNOSIS — Z79899 Other long term (current) drug therapy: Secondary | ICD-10-CM | POA: Diagnosis not present

## 2024-05-18 DIAGNOSIS — Z79899 Other long term (current) drug therapy: Secondary | ICD-10-CM | POA: Diagnosis not present

## 2024-05-18 DIAGNOSIS — G5702 Lesion of sciatic nerve, left lower limb: Secondary | ICD-10-CM | POA: Diagnosis not present

## 2024-05-31 ENCOUNTER — Telehealth (HOSPITAL_COMMUNITY): Payer: Self-pay | Admitting: *Deleted

## 2024-05-31 NOTE — Telephone Encounter (Signed)
 Reaching out to patient to offer assistance regarding upcoming cardiac imaging study; pt verbalizes understanding of appt date/time, parking situation and where to check in, pre-test NPO status and medications ordered, and verified current allergies; name and call back number provided for further questions should they arise Johney Frame RN Navigator Cardiac Imaging [facility] [phone] office [phone] cell  Patient aware to avoid caffeine for 12 hours prior to test.

## 2024-06-01 ENCOUNTER — Encounter (HOSPITAL_COMMUNITY)
Admission: RE | Admit: 2024-06-01 | Discharge: 2024-06-01 | Disposition: A | Discharge status: Home / Self Care | Source: Ambulatory Visit | Attending: Cardiology | Admitting: Cardiology

## 2024-06-01 ENCOUNTER — Ambulatory Visit: Admitting: Cardiovascular Disease

## 2024-06-01 DIAGNOSIS — I2510 Atherosclerotic heart disease of native coronary artery without angina pectoris: Secondary | ICD-10-CM | POA: Insufficient documentation

## 2024-06-01 LAB — NM PET CT CARDIAC PERFUSION MULTI W/ABSOLUTE BLOODFLOW
LV dias vol: 99 mL (ref 46–106)
LV sys vol: 40 mL (ref 3.8–5.2)
MBFR: 1.74
Nuc Rest EF: 60 %
Nuc Stress EF: 59 %
Peak HR: 71 {beats}/min
Rest HR: 62 {beats}/min
Rest MBF: 0.82 ml/g/min
Rest Nuclear Isotope Dose: 24.9 mCi
ST Depression (mm): 0 mm
Stress MBF: 1.43 ml/g/min
Stress Nuclear Isotope Dose: 25 mCi

## 2024-06-01 MED ORDER — REGADENOSON 0.4 MG/5ML IV SOLN
0.4000 mg | Freq: Once | INTRAVENOUS | Status: AC
  Administered 2024-06-01: 0.4 mg via INTRAVENOUS

## 2024-06-01 MED ORDER — RUBIDIUM RB82 GENERATOR (RUBYFILL)
25.0200 | PACK | Freq: Once | INTRAVENOUS | Status: AC
Start: 2024-06-01 — End: 2024-06-01
  Administered 2024-06-01: 25.02 via INTRAVENOUS

## 2024-06-01 MED ORDER — RUBIDIUM RB82 GENERATOR (RUBYFILL)
24.9200 | PACK | Freq: Once | INTRAVENOUS | Status: AC
  Administered 2024-06-01: 24.92 via INTRAVENOUS

## 2024-06-01 MED ORDER — REGADENOSON 0.4 MG/5ML IV SOLN
INTRAVENOUS | Status: AC
  Filled 2024-06-01: qty 5

## 2024-06-01 MED ORDER — FLOTUFOLASTAT F 18 GALLIUM 296-5846 MBQ/ML IV SOLN
25.0200 | Freq: Once | INTRAVENOUS | Status: DC
  Filled 2024-06-01: qty 26

## 2024-06-03 ENCOUNTER — Ambulatory Visit: Payer: Self-pay | Admitting: Cardiology

## 2024-06-03 NOTE — Telephone Encounter (Signed)
 Patient has opted out of procedure and does not want to schedule at this time. Will call back if she changes her mind.

## 2024-06-03 NOTE — Telephone Encounter (Signed)
   Name: [name]  DOB: 01-Oct-1960  MRN: [id number]  Primary Cardiologist: Wilbert Bihari, MD   Preoperative team, please contact this patient and set up a phone call appointment for further preoperative risk assessment. Please obtain consent and complete medication review. Thank you for your help.  I confirm that guidance regarding antiplatelet and oral anticoagulation therapy has been completed and, if necessary, noted below.  Regarding ASA therapy, we recommend continuation of ASA throughout the perioperative period.  However, if the surgeon feels that cessation of ASA is required in the perioperative period, it may be stopped 5-7 days prior to surgery with a plan to resume it as soon as felt to be feasible from a surgical standpoint in the post-operative period.  I also confirmed the patient resides in the state of [place] . As per [facility] Medical Board telemedicine laws, the patient must reside in the state in which the provider is licensed.   Damien JAYSON Braver, NP 06/03/2024, 1:46 PM [facility]

## 2024-06-03 NOTE — Progress Notes (Signed)
 Pt has been made aware of normal result and verbalized understanding.  jw

## 2024-06-06 DIAGNOSIS — I2510 Atherosclerotic heart disease of native coronary artery without angina pectoris: Secondary | ICD-10-CM | POA: Diagnosis not present

## 2024-06-06 DIAGNOSIS — J9611 Chronic respiratory failure with hypoxia: Secondary | ICD-10-CM | POA: Diagnosis not present

## 2024-06-06 DIAGNOSIS — J9622 Acute and chronic respiratory failure with hypercapnia: Secondary | ICD-10-CM | POA: Diagnosis not present

## 2024-06-10 DIAGNOSIS — M542 Cervicalgia: Secondary | ICD-10-CM | POA: Diagnosis not present

## 2024-06-10 DIAGNOSIS — M2550 Pain in unspecified joint: Secondary | ICD-10-CM | POA: Diagnosis not present

## 2024-06-10 DIAGNOSIS — R109 Unspecified abdominal pain: Secondary | ICD-10-CM | POA: Diagnosis not present

## 2024-06-10 DIAGNOSIS — G8929 Other chronic pain: Secondary | ICD-10-CM | POA: Diagnosis not present

## 2024-06-10 DIAGNOSIS — Z79899 Other long term (current) drug therapy: Secondary | ICD-10-CM | POA: Diagnosis not present

## 2024-06-10 DIAGNOSIS — J449 Chronic obstructive pulmonary disease, unspecified: Secondary | ICD-10-CM | POA: Diagnosis not present

## 2024-06-10 DIAGNOSIS — M545 Low back pain, unspecified: Secondary | ICD-10-CM | POA: Diagnosis not present

## 2024-06-10 DIAGNOSIS — K7460 Unspecified cirrhosis of liver: Secondary | ICD-10-CM | POA: Diagnosis not present

## 2024-06-13 ENCOUNTER — Ambulatory Visit
Admission: RE | Admit: 2024-06-13 | Discharge: 2024-06-13 | Disposition: A | Source: Ambulatory Visit | Attending: Family Medicine | Admitting: Family Medicine

## 2024-06-13 DIAGNOSIS — Z1231 Encounter for screening mammogram for malignant neoplasm of breast: Secondary | ICD-10-CM

## 2024-07-08 DIAGNOSIS — M542 Cervicalgia: Secondary | ICD-10-CM | POA: Diagnosis not present

## 2024-07-08 DIAGNOSIS — M545 Low back pain, unspecified: Secondary | ICD-10-CM | POA: Diagnosis not present

## 2024-07-08 DIAGNOSIS — R109 Unspecified abdominal pain: Secondary | ICD-10-CM | POA: Diagnosis not present

## 2024-07-08 DIAGNOSIS — J449 Chronic obstructive pulmonary disease, unspecified: Secondary | ICD-10-CM | POA: Diagnosis not present

## 2024-07-08 DIAGNOSIS — Z79899 Other long term (current) drug therapy: Secondary | ICD-10-CM | POA: Diagnosis not present

## 2024-07-08 DIAGNOSIS — K7460 Unspecified cirrhosis of liver: Secondary | ICD-10-CM | POA: Diagnosis not present

## 2024-07-08 DIAGNOSIS — M2550 Pain in unspecified joint: Secondary | ICD-10-CM | POA: Diagnosis not present

## 2024-07-13 ENCOUNTER — Emergency Department (HOSPITAL_BASED_OUTPATIENT_CLINIC_OR_DEPARTMENT_OTHER)

## 2024-07-13 ENCOUNTER — Emergency Department (HOSPITAL_BASED_OUTPATIENT_CLINIC_OR_DEPARTMENT_OTHER)
Admission: EM | Admit: 2024-07-13 | Discharge: 2024-07-13 | Disposition: A | Discharge status: Home / Self Care | Attending: Emergency Medicine | Admitting: Emergency Medicine

## 2024-07-13 ENCOUNTER — Encounter (HOSPITAL_BASED_OUTPATIENT_CLINIC_OR_DEPARTMENT_OTHER): Payer: Self-pay | Admitting: Emergency Medicine

## 2024-07-13 ENCOUNTER — Other Ambulatory Visit: Payer: Self-pay

## 2024-07-13 DIAGNOSIS — R112 Nausea with vomiting, unspecified: Secondary | ICD-10-CM | POA: Diagnosis present

## 2024-07-13 DIAGNOSIS — Z7982 Long term (current) use of aspirin: Secondary | ICD-10-CM | POA: Insufficient documentation

## 2024-07-13 DIAGNOSIS — R109 Unspecified abdominal pain: Secondary | ICD-10-CM | POA: Insufficient documentation

## 2024-07-13 DIAGNOSIS — E119 Type 2 diabetes mellitus without complications: Secondary | ICD-10-CM | POA: Insufficient documentation

## 2024-07-13 DIAGNOSIS — E876 Hypokalemia: Secondary | ICD-10-CM | POA: Diagnosis not present

## 2024-07-13 DIAGNOSIS — G8929 Other chronic pain: Secondary | ICD-10-CM | POA: Diagnosis not present

## 2024-07-13 LAB — CBC
HCT: 41.7 % (ref 36.0–46.0)
Hemoglobin: 13.6 g/dL (ref 12.0–15.0)
MCH: 28.8 pg (ref 26.0–34.0)
MCHC: 32.6 g/dL (ref 30.0–36.0)
MCV: 88.2 fL (ref 80.0–100.0)
Platelets: 121 K/uL — ABNORMAL LOW (ref 150–400)
RBC: 4.73 MIL/uL (ref 3.87–5.11)
RDW: 14.4 % (ref 11.5–15.5)
WBC: 5 K/uL (ref 4.0–10.5)
nRBC: 0 % (ref 0.0–0.2)

## 2024-07-13 LAB — COMPREHENSIVE METABOLIC PANEL WITH GFR
ALT: 15 U/L (ref 0–44)
AST: 29 U/L (ref 15–41)
Albumin: 4 g/dL (ref 3.5–5.0)
Alkaline Phosphatase: 82 U/L (ref 38–126)
Anion gap: 10 (ref 5–15)
BUN: 8 mg/dL (ref 8–23)
CO2: 33 mmol/L — ABNORMAL HIGH (ref 22–32)
Calcium: 9.3 mg/dL (ref 8.9–10.3)
Chloride: 95 mmol/L — ABNORMAL LOW (ref 98–111)
Creatinine, Ser: 1.09 mg/dL — ABNORMAL HIGH (ref 0.44–1.00)
GFR, Estimated: 56 mL/min — ABNORMAL LOW (ref >60)
Glucose, Bld: 98 mg/dL (ref 70–99)
Potassium: 2.9 mmol/L — ABNORMAL LOW (ref 3.5–5.1)
Sodium: 138 mmol/L (ref 135–145)
Total Bilirubin: 0.7 mg/dL (ref 0.0–1.2)
Total Protein: 8 g/dL (ref 6.5–8.1)

## 2024-07-13 LAB — LIPASE, BLOOD: Lipase: 31 U/L (ref 11–51)

## 2024-07-13 LAB — MAGNESIUM: Magnesium: 1.5 mg/dL — ABNORMAL LOW (ref 1.7–2.4)

## 2024-07-13 MED ORDER — OXYCODONE HCL 5 MG PO TABS
5.0000 mg | ORAL_TABLET | ORAL | Status: AC
  Administered 2024-07-13: 5 mg via ORAL
  Filled 2024-07-13: qty 1

## 2024-07-13 MED ORDER — ONDANSETRON HCL 4 MG/2ML IJ SOLN
4.0000 mg | Freq: Once | INTRAMUSCULAR | Status: AC
  Administered 2024-07-13: 4 mg via INTRAVENOUS
  Filled 2024-07-13: qty 2

## 2024-07-13 MED ORDER — POTASSIUM CHLORIDE 10 MEQ/100ML IV SOLN
10.0000 meq | Freq: Once | INTRAVENOUS | Status: AC
  Administered 2024-07-13: 10 meq via INTRAVENOUS
  Filled 2024-07-13: qty 100

## 2024-07-13 MED ORDER — ONDANSETRON 4 MG PO TBDP: 4.0000 mg | ORAL_TABLET | Freq: Three times a day (TID) | ORAL | 0 refills | Status: AC | PRN

## 2024-07-13 MED ORDER — MAGNESIUM OXIDE 400 MG PO TABS: 400.0000 mg | ORAL_TABLET | Freq: Every day | ORAL | 0 refills | Status: DC

## 2024-07-13 MED ORDER — MAGNESIUM SULFATE 2 GM/50ML IV SOLN
2.0000 g | Freq: Once | INTRAVENOUS | Status: AC
  Administered 2024-07-13: 2 g via INTRAVENOUS
  Filled 2024-07-13: qty 50

## 2024-07-13 MED ORDER — HYDROMORPHONE HCL 1 MG/ML IJ SOLN
1.0000 mg | Freq: Once | INTRAMUSCULAR | Status: AC
  Administered 2024-07-13: 1 mg via INTRAVENOUS
  Filled 2024-07-13: qty 1

## 2024-07-13 MED ORDER — IOHEXOL 300 MG/ML  SOLN
100.0000 mL | Freq: Once | INTRAMUSCULAR | Status: AC | PRN
  Administered 2024-07-13: 100 mL via INTRAVENOUS

## 2024-07-13 MED ORDER — POTASSIUM CHLORIDE CRYS ER 20 MEQ PO TBCR
20.0000 meq | EXTENDED_RELEASE_TABLET | Freq: Two times a day (BID) | ORAL | 0 refills | Status: AC
Start: 2024-07-13 — End: ?

## 2024-07-13 MED ORDER — POTASSIUM CHLORIDE CRYS ER 20 MEQ PO TBCR
40.0000 meq | EXTENDED_RELEASE_TABLET | ORAL | Status: AC
  Administered 2024-07-13: 40 meq via ORAL
  Filled 2024-07-13: qty 2

## 2024-07-13 NOTE — ED Notes (Signed)
 Patient transported to CT

## 2024-07-13 NOTE — ED Notes (Signed)
 IV potassium burning. Fluids increased to 153mL/hr. Tolerates well.

## 2024-07-13 NOTE — Discharge Instructions (Addendum)
 You were seen for your nausea and vomiting in the emergency department.   At home, please take the Zofran  for your nausea and vomiting.  Take the potassium and magnesium  we have given you because your electrolytes were low today.    Check your MyChart online for the results of any tests that had not resulted by the time you left the emergency department.   Follow-up with your primary doctor in 2-3 days regarding your visit.    Return immediately to the emergency department if you experience any of the following: Vomiting despite the medication, or any other concerning symptoms.    Thank you for visiting our Emergency Department. It was a pleasure taking care of you today.

## 2024-07-13 NOTE — ED Triage Notes (Signed)
 Reports vomiting x 4 days (Drinking diet dr pepper in triage)

## 2024-07-13 NOTE — ED Provider Notes (Signed)
 [place] EMERGENCY DEPARTMENT AT [facility] Provider Note   CSN: [id number] Arrival date & time: 07/13/24  1857     Patient presents with: Emesis   [name] is a 63 y.o. female.   63 year old female with a history of Hollie cirrhosis, chronic abdominal pain, diabetes, and multiple abdominal surgeries who presents emergency department with abdominal pain, nausea and vomiting, and decreased flatus.  Last bowel movement was on the 23rd of this month.  Reports that since then has been passing less flatus.  Today has not passed gas at all.  Over the past 4 days has had persistent nonbloody nonbilious emesis and is unable to hold anything down but was reportedly drinking Dr. Nunzio in triage.  Says she is concerned because she has had several small bowel obstructions in the past       Prior to Admission medications   Medication Sig Start Date End Date Taking? Authorizing Provider  magnesium  oxide (MAG-OX) 400 MG tablet Take 1 tablet (400 mg total) by mouth daily. 07/13/24  Yes Yolande Lamar BROCKS, MD  ondansetron  (ZOFRAN -ODT) 4 MG disintegrating tablet Take 1 tablet (4 mg total) by mouth every 8 (eight) hours as needed for nausea or vomiting. 07/13/24  Yes Yolande Lamar BROCKS, MD  potassium chloride  SA (KLOR-CON  M) 20 MEQ tablet Take 1 tablet (20 mEq total) by mouth 2 (two) times daily. 07/13/24  Yes Yolande Lamar BROCKS, MD  acetaminophen  (TYLENOL ) 500 MG tablet Take 500-1,000 mg by mouth every 6 (six) hours as needed for mild pain (pain score 1-3) or headache.    [provider]  albuterol  (PROVENTIL  HFA;VENTOLIN  HFA) 108 (90 BASE) MCG/ACT inhaler Inhale 2 puffs into the lungs every 6 (six) hours as needed for wheezing or shortness of breath.    [provider]  albuterol  (PROVENTIL ) (2.5 MG/3ML) 0.083% nebulizer solution Take 2.5 mg by nebulization every 6 (six) hours as needed for wheezing or shortness of breath.    [provider]  ALPRAZolam  (XANAX ) 1  MG tablet Take 1-2 mg by mouth See admin instructions. Take 2 mg by mouth at bedtime and an additional 1 mg once a day as needed for anxiety    [provider]  amLODipine  (NORVASC ) 5 MG tablet Take 5 mg by mouth daily.    [provider]  aspirin  EC 81 MG tablet Take 1 tablet (81 mg total) by mouth daily. Swallow whole. 03/28/24   Williams, Evan, PA-C  azithromycin  (ZITHROMAX ) 250 MG tablet Take 250 mg by mouth daily.    [provider]  baclofen  (LIORESAL ) 10 MG tablet Take 1 tablet (10 mg total) by mouth 3 (three) times daily. Patient taking differently: Take 10 mg by mouth 3 (three) times daily as needed for muscle spasms. 12/07/22   Tobie Yetta CHRISTELLA, MD  benazepril  (LOTENSIN ) 40 MG tablet Take 40 mg by mouth daily.    [provider]  Benzocaine-Resorcinol (VAGISIL EX) Apply 1 application topically daily.    [provider]  bisacodyl  (DULCOLAX) 10 MG suppository Place 1 suppository (10 mg total) rectally daily as needed for moderate constipation. 12/07/22   Tobie Yetta CHRISTELLA, MD  budesonide  (PULMICORT ) 1 MG/2ML nebulizer solution Take 1 mg by nebulization daily as needed (for shortness of breath- when sick). 04/08/21   [provider]  cloNIDine  (CATAPRES ) 0.2 MG tablet Take 0.2 mg by mouth daily.    [provider]  cyclobenzaprine  (FLEXERIL ) 10 MG tablet Take 10 mg by mouth 3 (three) times  daily as needed for muscle spasms.    [provider]  escitalopram  (LEXAPRO ) 20 MG tablet Take 20 mg by mouth daily.    [provider]  estradiol  (ESTRACE ) 2 MG tablet Take 2 mg by mouth daily.     [provider]  fluticasone  (FLONASE ) 50 MCG/ACT nasal spray Place 1 spray into both nostrils daily as needed for allergies.    [provider]  furosemide  (LASIX ) 40 MG tablet Take 1 tablet (40 mg total) by mouth daily. Take additional 40 mg when you have a weight gain of 3 lbs in 1 day or 5 lbs in 1 week. Patient  taking differently: Take 40 mg by mouth See admin instructions. Take 40 mg by mouth once a day and an additional 40 with a weight gain of 3 pounds/day or 5 pounds/week 12/08/22   Patel, Pranav M, MD  Glycerin -Polysorbate 80 (REFRESH DRY EYE THERAPY OP) Place 1 drop into both eyes 3 (three) times daily as needed (for dryness).    [provider]  glycopyrrolate  (ROBINUL ) 1 MG tablet Take 1-2 mg by mouth daily as needed (for excessive saliva).    [provider]  lactulose  (CHRONULAC ) 10 GM/15ML solution Take 15 mLs by mouth 2 (two) times daily as needed for mild constipation. 10/22/22   [provider]  levocetirizine (XYZAL ) 5 MG tablet Take 5 mg by mouth every evening.  09/16/19   [provider]  lidocaine  (LIDODERM ) 5 % Place 1 patch onto the skin daily. Remove & Discard patch within 12 hours or as directed by MD Patient taking differently: Place 1 patch onto the skin daily as needed (pain). Remove & Discard patch within 12 hours or as directed by MD 11/07/22   Tegeler, Lonni PARAS, MD  lidocaine  (XYLOCAINE ) 5 % ointment Apply 1 application topically 2 (two) times daily as needed (hemorrhoids).    [provider]  methylPREDNISolone  (MEDROL  DOSEPAK) 4 MG TBPK tablet See instructions 01/20/24   Dreama Longs, MD  mupirocin ointment (BACTROBAN) 2 % Apply 1 Application topically 2 (two) times daily as needed (for sores in the nose). 10/27/22   [provider]  naloxone  (NARCAN ) 4 MG/0.1ML LIQD nasal spray kit Place 1 spray into the nose as needed (for overdose).     [provider]  nystatin  (MYCOSTATIN /NYSTOP ) powder Apply 1 g topically 4 (four) times daily as needed (irritation).    [provider]  ondansetron  (ZOFRAN ) 8 MG tablet Take 8 mg by mouth every 8 (eight) hours as needed for nausea or vomiting.    [provider]  oxyCODONE  (ROXICODONE ) 15 MG immediate release tablet Take 1 tablet (15 mg total) by mouth every 8  (eight) hours as needed for pain. 03/29/21   Krishnan, Sendil K, MD  Oxycodone  HCl 20 MG TABS Take 20 mg by mouth See admin instructions. Take 20 mg by mouth every 4 hours- max of 5 tablets/24 hours    [provider]  OZEMPIC, 1 MG/DOSE, 4 MG/3ML SOPN Inject 1 mg into the skin every Monday.    [provider]  pantoprazole  (PROTONIX ) 40 MG tablet Take 40 mg by mouth 2 (two) times daily before a meal.    [provider]  PHAZYME MAXIMUM STRENGTH 250 MG CAPS Take 250 mg by mouth 2 (two) times daily as needed (for gas).    [provider]  polyethylene glycol (MIRALAX  / GLYCOLAX ) 17 g packet Take 17 g by mouth 2 (two) times daily. Patient taking  differently: Take 17 g by mouth daily as needed for moderate constipation (AND HOLD FOR DIARRHEA- mix as directed). 12/07/22   Tobie Yetta HERO, MD  prochlorperazine  (COMPAZINE ) 10 MG tablet Take 10 mg by mouth every 6 (six) hours as needed for nausea or vomiting (not relieved by Zofran ).    [provider]  promethazine  (PHENERGAN ) 25 MG tablet Take 25 mg by mouth every 8 (eight) hours as needed for nausea or vomiting.    [provider]  rizatriptan (MAXALT-MLT) 10 MG disintegrating tablet Take 10 mg by mouth as needed for migraine (may repeat once in 2 hours, if no relief- dissolve orally).    [provider]  Simethicone  80 MG TABS Take 1 tablet (80 mg total) by mouth in the morning, at noon, in the evening, and at bedtime. 12/07/22   Tobie Yetta HERO, MD  simvastatin  (ZOCOR ) 20 MG tablet Take 20 mg by mouth at bedtime.    [provider]  topiramate  (TOPAMAX ) 50 MG tablet Take 50 mg by mouth daily as needed (migraines).    [provider]  traZODone  (DESYREL ) 50 MG tablet Take 100 mg by mouth at bedtime. 09/05/22   [provider]  triamcinolone  cream (KENALOG ) 0.1 % Apply 1 application  topically 2 (two) times daily as needed (for rashes).    [provider]     Allergies: Spironolactone , Bactrim [sulfamethoxazole-trimethoprim], Gabapentin, Amoxicillin , Clindamycin/lincomycin, Doxycycline , Influenza virus vaccine, Linzess  [linaclotide ], Metronidazole , and Treximet [sumatriptan-naproxen sodium]    Review of Systems  Updated Vital Signs BP 134/66   Pulse 66   Temp 98.2 F (36.8 C) (Oral)   Resp 20   SpO2 97%   Physical Exam Vitals and nursing note reviewed.  Constitutional:      General: She is not in acute distress.    Appearance: She is well-developed.  HENT:     Head: Normocephalic and atraumatic.     Right Ear: External ear normal.     Left Ear: External ear normal.     Nose: Nose normal.  Eyes:     Extraocular Movements: Extraocular movements intact.     Conjunctiva/sclera: Conjunctivae normal.     Pupils: Pupils are equal, round, and reactive to light.  Abdominal:     General: Abdomen is flat. There is distension.     Palpations: Abdomen is soft. There is no mass.     Tenderness: There is abdominal tenderness. There is no guarding.  Musculoskeletal:     Cervical back: Normal range of motion and neck supple.  Skin:    General: Skin is warm and dry.  Neurological:     Mental Status: She is alert and oriented to person, place, and time. Mental status is at baseline.  Psychiatric:        Mood and Affect: Mood normal.     (all labs ordered are listed, but only abnormal results are displayed) Labs Reviewed  COMPREHENSIVE METABOLIC PANEL WITH GFR - Abnormal; Notable for the following components:      Result Value   Potassium 2.9 (*)    Chloride 95 (*)    CO2 33 (*)    Creatinine, Ser 1.09 (*)    GFR, Estimated 56 (*)    All other components within normal limits  CBC - Abnormal; Notable for the following components:   Platelets 121 (*)    All other components within normal limits  MAGNESIUM  - Abnormal; Notable for the following components:   Magnesium  1.5 (*)  All other components within normal limits  LIPASE,  BLOOD  URINALYSIS, ROUTINE W REFLEX MICROSCOPIC    EKG: EKG Interpretation Date/Time:  Wednesday July 13 2024 19:25:21 EDT Ventricular Rate:  64 PR Interval:  102 QRS Duration:  100 QT Interval:  433 QTC Calculation: 447 R Axis:   108  Text Interpretation: Sinus rhythm Short PR interval Right axis deviation Low voltage, precordial leads Borderline repolarization abnormality Confirmed by Yolande Charleston [phone]) on 07/13/2024 10:25:16 PM  Radiology: CT ABDOMEN PELVIS W CONTRAST Result Date: 07/13/2024 EXAM: CT ABDOMEN AND PELVIS WITH CONTRAST 07/13/2024 09:40:19 PM TECHNIQUE: CT of the abdomen and pelvis was performed with the administration of 100 mL of iohexol  (OMNIPAQUE ) 300 MG/ML solution. Multiplanar reformatted images are provided for review. Automated exposure control, iterative reconstruction, and/or weight-based adjustment of the mA/kV was utilized to reduce the radiation dose to as low as reasonably achievable. COMPARISON: PET CT 06/01/2024 CLINICAL HISTORY: Bowel obstruction suspected. Reports vomiting x 4 days. (Drinking diet dr pepper in triage). FINDINGS: LOWER CHEST: Increased visibility along the right lower lobe subpleural space (2.6). LIVER: Nodular hepatic contour. GALLBLADDER AND BILE DUCTS: Gallbladder is unremarkable. No biliary ductal dilatation. SPLEEN: The spleen is normal in size. 1.7 cm fluid density lesion within the spleen with discontinuous peripheral nodular enhancement suggestive of a hemangioma. Subcentimeter hypodensity adjacent to this likely a similar finding. PANCREAS: No acute abnormality. ADRENAL GLANDS: No acute abnormality. KIDNEYS, URETERS AND BLADDER: Hypodense lesion within the right kidney likely represents a simple renal cyst. Simple renal cysts do not require additional follow-up unless clinically indicated due to signs or symptoms. No stones in the kidneys or ureters. No hydronephrosis. No perinephric or periureteral stranding. Urinary bladder is  unremarkable. No filling defects of the partially visualized collecting systems on delayed imaging. GI AND BOWEL: Stomach demonstrates no acute abnormality. No small or large bowel wall thickening. There is no bowel obstruction. The appendix is not definitely identified, with no inflammatory changes in the right lower quadrant to suggest acute appendicitis. PERITONEUM AND RETROPERITONEUM: Trace volume simple free fluid ascites. No free air. VASCULATURE: Aorta is normal in caliber. Mild atherosclerotic plaque. The portal, splenic, and superior mesenteric veins are patent. LYMPH NODES: No lymphadenopathy. REPRODUCTIVE ORGANS: Status post hysterectomy. No adnexal mass. BONES AND SOFT TISSUES: Bilateral L5 pars interarticularis defects with grade 2 anterolisthesis of L5 on S1. No focal soft tissue abnormality. IMPRESSION: 1. Cirrhosis with no definite finding of portal hypertension. No focal liver lesions identified. Please note that liver protocol enhanced MR and CT are the most sensitive tests for the screening detection of hepatocellular carcinoma in the high-risk setting of cirrhosis. 2. Trace simple ascites. Electronically signed by: Morgane Naveau MD 07/13/2024 09:49 PM EDT RP Workstation: [id number]     Procedures   Medications Ordered in the ED  HYDROmorphone  (DILAUDID ) injection 1 mg (1 mg Intravenous Given 07/13/24 2125)  ondansetron  (ZOFRAN ) injection 4 mg (4 mg Intravenous Given 07/13/24 2125)  potassium chloride  10 mEq in 100 mL IVPB (10 mEq Intravenous New Bag/Given 07/13/24 2201)  iohexol  (OMNIPAQUE ) 300 MG/ML solution 100 mL (100 mLs Intravenous Contrast Given 07/13/24 2133)  potassium chloride  SA (KLOR-CON  M) CR tablet 40 mEq (40 mEq Oral Given 07/13/24 2230)  magnesium  sulfate IVPB 2 g 50 mL (2 g Intravenous New Bag/Given 07/13/24 2233)  oxyCODONE  (Oxy IR/ROXICODONE ) immediate release tablet 5 mg (5 mg Oral Given 07/13/24 2241)  Medical Decision  Making Amount and/or Complexity of Data Reviewed Labs: ordered. Radiology: ordered.  Risk OTC drugs. Prescription drug management.   63 year old female with a history of Hollie cirrhosis, chronic abdominal pain, diabetes, and multiple abdominal surgeries who presents emergency department with abdominal pain, nausea and vomiting, and decreased flatus.   Initial Ddx:  SBO, gastroenteritis, ileus, electrolyte abnormality, SBP  MDM/Course:  Patient presents emergency department abdominal pain and nausea and vomiting.  Also reports some decreased flatus.  Reports that she has a history of multiple abdominal surgeries and has had SBO in the past.  Does have a distended abdomen with mild diffuse tenderness to palpation.  Underwent CT scan that did not show evidence of SBO or ileus.  Did have trace ascites.  Most concern for SBP at this point in time and I suspect that her symptoms are more so because of her nausea and vomiting.  Lab work shows that her potassium was 2.9 and magnesium  was 1.5.  These were replenished via IV and she was given additional potassium p.o.  Upon re-evaluation nausea and vomiting is resolved.  She is tolerating fluids at this point in time.  Given her reassuring imaging and the fact that she is taking and p.o. feel that she can be discharged with Zofran  and magnesium  and potassium supplementation with PCP follow-up.  This patient presents to the ED for concern of complaints listed in HPI, this involves an extensive number of treatment options, and is a complaint that carries with it a high risk of complications and morbidity. Disposition including potential need for admission considered.   Dispo: DC Home. Return precautions discussed including, but not limited to, those listed in the AVS. Allowed pt time to ask questions which were answered fully prior to dc.  Records reviewed ED Visit Notes The following labs were independently interpreted: CBC and show no acute  abnormality I independently reviewed the following imaging with scope of interpretation limited to determining acute life threatening conditions related to emergency care: CT Abdomen/Pelvis and agree with the radiologist interpretation with the following exceptions: none I personally reviewed and interpreted cardiac monitoring: normal sinus rhythm  I personally reviewed and interpreted the pt's EKG: see above for interpretation  I have reviewed the patients home medications and made adjustments as needed  Portions of this note were generated with Dragon dictation software. Dictation errors may occur despite best attempts at proofreading.     Final diagnoses:  Nausea and vomiting, unspecified vomiting type  Chronic abdominal pain  Hypokalemia  Hypomagnesemia    ED Discharge Orders          Ordered    magnesium  oxide (MAG-OX) 400 MG tablet  Daily        07/13/24 2259    potassium chloride  SA (KLOR-CON  M) 20 MEQ tablet  2 times daily        07/13/24 2259    ondansetron  (ZOFRAN -ODT) 4 MG disintegrating tablet  Every 8 hours PRN        07/13/24 2259               Yolande Lamar BROCKS, MD 07/13/24 2306

## 2024-07-26 ENCOUNTER — Telehealth: Payer: Self-pay

## 2024-07-26 NOTE — Telephone Encounter (Signed)
   Pre-operative Risk Assessment    Patient Name: [name]  DOB: [birth year] MRN: [id number]   Date of last office visit: 03/24/24  Artist Pouch, PA Date of next office visit: 09/14/24  Dr. Shlomo   Request for Surgical Clearance    Procedure:  L5-S1 Lumbar Fusion  Date of Surgery:  Clearance TBD                               Surgeon:  Arley P. Onetha, MD Surgeon's Group or Practice Name:  [facility] Phone number:  [phone] Fax number:  [phone] att: Shanda   Type of Clearance Requested:   - Medical  - Pharmacy:  Hold Aspirin      Type of Anesthesia:  General    Additional requests/questions:    [name]   07/26/2024, 5:07 PM

## 2024-07-27 NOTE — Telephone Encounter (Signed)
 Called patient tried to schedule IN OFFICE VISIT patient stated she is not planning on getting surgery done made patient aware if she decides to go on with surgery a new updated clearance will need to be resent patient voiced understanding

## 2024-07-27 NOTE — Telephone Encounter (Signed)
   Name: [name]  DOB: [birth year]  MRN: [id number]  Primary Cardiologist: Wilbert Bihari, MD  Chart reviewed as part of pre-operative protocol coverage. Because of Bettylee Feig Huegel's past medical history and time since last visit, she will require a follow-up in-office visit in order to better assess preoperative cardiovascular risk.  The patient already appears to have an appointment in December with Dr. Bihari. She may keep this appointment, and I will change the note to address pre-op clearance. However, if the patient were to need her surgery sooner than her follow up appointment, the patient will require an in office visit prior to surgery, and would likely need her scheduled appointment time moved to an earlier date.   Pre-op covering staff:  - Please contact requesting surgeon's office via preferred method (i.e, phone, fax) to inform them of need for appointment prior to surgery.   Leaha Cuervo E Draden Cottingham, NP  07/27/2024, 1:58 PM

## 2024-08-26 ENCOUNTER — Other Ambulatory Visit: Payer: Self-pay | Admitting: Nurse Practitioner

## 2024-08-26 ENCOUNTER — Ambulatory Visit
Admission: RE | Admit: 2024-08-26 | Discharge: 2024-08-26 | Disposition: A | Source: Ambulatory Visit | Attending: Nurse Practitioner | Admitting: Nurse Practitioner

## 2024-08-26 DIAGNOSIS — R112 Nausea with vomiting, unspecified: Secondary | ICD-10-CM

## 2024-08-26 DIAGNOSIS — K7469 Other cirrhosis of liver: Secondary | ICD-10-CM

## 2024-08-31 ENCOUNTER — Other Ambulatory Visit: Payer: Self-pay | Admitting: Gastroenterology

## 2024-09-13 NOTE — Progress Notes (Unsigned)
 "  Date:  09/14/2024   ID:  [name], [name] March 16, 1961, MRN [id number]   PCP:  Arloa Elsie SAUNDERS, MD  Cardiologist:  Wilbert Bihari, MD  Electrophysiologist:  None   Chief Complaint:  CAD, HTN, HLD  History of Present Illness:    [name] is a 63 y.o. female with a history of moderate nonobstructive CAD with 60-70% LCx by cath 2010, COPD, DM, GERD, HTN, hyperlipidemia.  She also has a hx of Hollie with liver cirrhosis and ascites and has chronic abdominal pain.    She is here today and is doing well.  She denies any CP or pressure, SOB, DOE, dizziness, palpitations or syncope. She has chronic LE edema that is stable with diuretics. She has cirrhosis of the liver and has bad nausea and vomiting along with intestinal adhesions and is followed by Dr. Dianna and Dr. Arloa.  She is on a lot of antiemetics.   Prior CV studies:   The following studies were reviewed today:  EKG  Past Medical History:  Diagnosis Date   Abdominal pain 01/12/2019   Abdominal pain, generalized 07/10/2014   Acute on chronic respiratory failure with hypoxia ([name]) 10/18/2018   Anxiety    Arthritis    Ascites 05/01/2019   NALD   Asthma    CAD (coronary artery disease)    cath 2010 with 60-70% left Cx and normal LVF   Chronic low back pain with left-sided sciatica 10/15/2015   Chronic pain    Chronic prescription benzodiazepine use 05/01/2019   Chronic, continuous use of opioids 05/01/2019   Cirrhosis of liver not due to alcohol  ([name]) 05/01/2019   Cirrhosis of liver: per CT 07/11/2014   Constipation by delayed colonic transit 12/09/2014   COPD (chronic obstructive pulmonary disease) ([name])    COPD exacerbation ([name]) 10/12/2016   Dehiscence of surgical wound left knee 03/16/2013   Depression    Depression with anxiety 01/11/2019   Deviated septum    Diabetes mellitus without complication ([name])    type 2   Generalized abdominal pain 12/09/2014   GERD (gastroesophageal reflux disease)    Headache(784.0)     migraines   Hepatic cirrhosis ([name]) 06/2014   HLD (hyperlipidemia) 01/11/2019   Hypercholesterolemia    Hypertension    Hypertension associated with diabetes ([name])    Ileus ([name]) 07/11/2014   Influenza A 10/2018   Lobar pneumonia    Microcytic anemia 01/11/2019   Migraine headache 12/09/2014   Morbid obesity ([name]) 03/09/2013   Obesity    OSA (obstructive sleep apnea) 01/11/2019   Pars defect with spondylolisthesis 05/01/2019   Perforated bowel ([name])    PONV (postoperative nausea and vomiting)    Respiratory failure, acute-on-chronic ([name]) 12/11/2010   S/P left PF UKR 03/07/2013   Seizures ([name])    as a little girl 62 years old   Shortness of breath    Sleep apnea    dont wear bipap . lost weight   Spondylolisthesis, grade 2    Tobacco abuse    Type 2 diabetes mellitus with obesity 01/11/2019   Past Surgical History:  Procedure Laterality Date   ABDOMINAL EXPLORATION SURGERY     primary repair of colon perforation from [facility]   ABDOMINAL HYSTERECTOMY     total   BACK SURGERY     lumbar   COLONOSCOPY WITH PROPOFOL  N/A 10/04/2019   Procedure: COLONOSCOPY WITH PROPOFOL ;  Surgeon: Dianna Specking, MD;  Location: [facility];  Service: Endoscopy;  Laterality: N/A;  ESOPHAGOGASTRODUODENOSCOPY (EGD) WITH PROPOFOL  N/A 10/29/2016   Procedure: ESOPHAGOGASTRODUODENOSCOPY (EGD) WITH PROPOFOL ;  Surgeon: Lynwood Bohr, MD;  Location: [facility];  Service: Endoscopy;  Laterality: N/A;   ESOPHAGOGASTRODUODENOSCOPY (EGD) WITH PROPOFOL  N/A 10/04/2019   Procedure: ESOPHAGOGASTRODUODENOSCOPY (EGD) WITH PROPOFOL ;  Surgeon: Dianna Specking, MD;  Location: [facility];  Service: Endoscopy;  Laterality: N/A;   ESOPHAGOGASTRODUODENOSCOPY (EGD) WITH PROPOFOL  N/A 02/03/2023   Procedure: ESOPHAGOGASTRODUODENOSCOPY (EGD) WITH PROPOFOL ;  Surgeon: Dianna Specking, MD;  Location: [facility];  Service: Gastroenterology;  Laterality: N/A;   flank ablation     IR PARACENTESIS  01/12/2019   IR PARACENTESIS   05/02/2019   IR PARACENTESIS  08/03/2019   IR PARACENTESIS  08/12/2022   IR PARACENTESIS  08/14/2022   IR PARACENTESIS  07/16/2023   IR PARACENTESIS  09/17/2023   IRRIGATION AND DEBRIDEMENT KNEE Left 03/14/2013   Procedure: IRRIGATION AND DEBRIDEMENT  and Closure of wound left KNEE;  Surgeon: Donnice JONETTA Car, MD;  Location: [facility];  Service: Orthopedics;  Laterality: Left;   PATELLA-FEMORAL ARTHROPLASTY Left 03/07/2013   Procedure: LEFT PATELLA-FEMORAL ARTHROPLASTY;  Surgeon: Donnice JONETTA Car, MD;  Location: [facility];  Service: Orthopedics;  Laterality: Left;   SAVORY DILATION N/A 10/29/2016   Procedure: SAVORY DILATION;  Surgeon: Lynwood Bohr, MD;  Location: [facility];  Service: Endoscopy;  Laterality: N/A;     Current Meds  Medication Sig   acetaminophen  (TYLENOL ) 500 MG tablet Take 500-1,000 mg by mouth every 6 (six) hours as needed for mild pain (pain score 1-3) or headache.   albuterol  (PROVENTIL  HFA;VENTOLIN  HFA) 108 (90 BASE) MCG/ACT inhaler Inhale 2 puffs into the lungs every 6 (six) hours as needed for wheezing or shortness of breath.   albuterol  (PROVENTIL ) (2.5 MG/3ML) 0.083% nebulizer solution Take 2.5 mg by nebulization every 6 (six) hours as needed for wheezing or shortness of breath.   ALPRAZolam  (XANAX ) 1 MG tablet Take 1-2 mg by mouth See admin instructions. Take 2 mg by mouth at bedtime and an additional 1 mg once a day as needed for anxiety   amLODipine  (NORVASC ) 5 MG tablet Take 5 mg by mouth daily.   aspirin  EC 81 MG tablet Take 1 tablet (81 mg total) by mouth daily. Swallow whole.   azithromycin  (ZITHROMAX ) 250 MG tablet Take 250 mg by mouth daily.   baclofen  (LIORESAL ) 10 MG tablet Take 1 tablet (10 mg total) by mouth 3 (three) times daily.   benazepril  (LOTENSIN ) 40 MG tablet Take 40 mg by mouth daily.   Benzocaine-Resorcinol (VAGISIL EX) Apply 1 application topically daily.   bisacodyl  (DULCOLAX) 10 MG suppository Place 1 suppository (10 mg total) rectally daily as needed  for moderate constipation.   budesonide  (PULMICORT ) 1 MG/2ML nebulizer solution Take 1 mg by nebulization daily as needed (for shortness of breath- when sick).   cloNIDine  (CATAPRES ) 0.2 MG tablet Take 0.2 mg by mouth daily.   cyclobenzaprine  (FLEXERIL ) 10 MG tablet Take 10 mg by mouth 3 (three) times daily as needed for muscle spasms.   escitalopram  (LEXAPRO ) 20 MG tablet Take 20 mg by mouth daily.   estradiol  (ESTRACE ) 2 MG tablet Take 2 mg by mouth daily.    fluticasone  (FLONASE ) 50 MCG/ACT nasal spray Place 1 spray into both nostrils daily as needed for allergies.   furosemide  (LASIX ) 40 MG tablet Take 1 tablet (40 mg total) by mouth daily. Take additional 40 mg when you have a weight gain of 3 lbs in 1 day or 5 lbs in 1 week.  Glycerin -Polysorbate 80 (REFRESH DRY EYE THERAPY OP) Place 1 drop into both eyes 3 (three) times daily as needed (for dryness).   glycopyrrolate  (ROBINUL ) 1 MG tablet Take 1-2 mg by mouth daily as needed (for excessive saliva).   lactulose  (CHRONULAC ) 10 GM/15ML solution Take 15 mLs by mouth 2 (two) times daily as needed for mild constipation.   levocetirizine (XYZAL ) 5 MG tablet Take 5 mg by mouth every evening.    lidocaine  (LIDODERM ) 5 % Place 1 patch onto the skin daily. Remove & Discard patch within 12 hours or as directed by MD   lidocaine  (XYLOCAINE ) 5 % ointment Apply 1 application topically 2 (two) times daily as needed (hemorrhoids).   magnesium  oxide (MAG-OX) 400 MG tablet Take 1 tablet (400 mg total) by mouth daily.   methylPREDNISolone  (MEDROL  DOSEPAK) 4 MG TBPK tablet See instructions   mupirocin ointment (BACTROBAN) 2 % Apply 1 Application topically 2 (two) times daily as needed (for sores in the nose).   naloxone  (NARCAN ) 4 MG/0.1ML LIQD nasal spray kit Place 1 spray into the nose as needed (for overdose).    nystatin  (MYCOSTATIN /NYSTOP ) powder Apply 1 g topically 4 (four) times daily as needed (irritation).   ondansetron  (ZOFRAN ) 8 MG tablet Take 8  mg by mouth every 8 (eight) hours as needed for nausea or vomiting.   ondansetron  (ZOFRAN -ODT) 4 MG disintegrating tablet Take 1 tablet (4 mg total) by mouth every 8 (eight) hours as needed for nausea or vomiting.   oxyCODONE  (ROXICODONE ) 15 MG immediate release tablet Take 1 tablet (15 mg total) by mouth every 8 (eight) hours as needed for pain.   Oxycodone  HCl 20 MG TABS Take 20 mg by mouth See admin instructions. Take 20 mg by mouth every 4 hours- max of 5 tablets/24 hours   OZEMPIC, 1 MG/DOSE, 4 MG/3ML SOPN Inject 1 mg into the skin every Monday.   pantoprazole  (PROTONIX ) 40 MG tablet Take 40 mg by mouth 2 (two) times daily before a meal.   PHAZYME MAXIMUM STRENGTH 250 MG CAPS Take 250 mg by mouth 2 (two) times daily as needed (for gas).   polyethylene glycol (MIRALAX  / GLYCOLAX ) 17 g packet Take 17 g by mouth 2 (two) times daily. (Patient taking differently: Take 17 g by mouth daily as needed for moderate constipation (AND HOLD FOR DIARRHEA- mix as directed).)   potassium chloride  SA (KLOR-CON  M) 20 MEQ tablet Take 1 tablet (20 mEq total) by mouth 2 (two) times daily.   prochlorperazine  (COMPAZINE ) 10 MG tablet Take 10 mg by mouth every 6 (six) hours as needed for nausea or vomiting (not relieved by Zofran ).   promethazine  (PHENERGAN ) 25 MG tablet Take 25 mg by mouth every 8 (eight) hours as needed for nausea or vomiting.   rizatriptan (MAXALT-MLT) 10 MG disintegrating tablet Take 10 mg by mouth as needed for migraine (may repeat once in 2 hours, if no relief- dissolve orally).   Simethicone  80 MG TABS Take 1 tablet (80 mg total) by mouth in the morning, at noon, in the evening, and at bedtime.   simvastatin  (ZOCOR ) 20 MG tablet Take 20 mg by mouth at bedtime.   topiramate  (TOPAMAX ) 50 MG tablet Take 50 mg by mouth daily as needed (migraines).   traZODone  (DESYREL ) 50 MG tablet Take 100 mg by mouth at bedtime.   triamcinolone  cream (KENALOG ) 0.1 % Apply 1 application  topically 2 (two) times  daily as needed (for rashes).     Allergies:   Spironolactone , Bactrim [  sulfamethoxazole-trimethoprim], Gabapentin, Amoxicillin , Clindamycin/lincomycin, Doxycycline , Haemophilus b polysaccharide vaccine, Influenza vac split quad, Influenza virus vaccine, Linzess  [linaclotide ], Metronidazole , Rizatriptan benzoate, and Treximet [sumatriptan-naproxen sodium]   Social History   Tobacco Use   Smoking status: Former    Current packs/day: 0.00    Average packs/day: 2.0 packs/day for 46.0 years (92.0 ttl pk-yrs)    Types: E-cigarettes, Cigarettes    Start date: 64    Quit date: 2022    Years since quitting: 4.0   Smokeless tobacco: Current   Tobacco comments:    Patient uses vape  Substance Use Topics   Alcohol  use: No   Drug use: No     Family Hx: The patient's family history includes Alcohol  abuse in her sister; Breast cancer in her maternal aunt; Cancer - Lung in her father; Emphysema in her paternal grandfather; Heart attack in her sister; Hypertension in her brother; Stroke in her mother.  ROS:   Please see the history of present illness.     All other systems reviewed and are negative.   Labs/Other Tests and Data Reviewed:    Recent Labs: 03/24/2024: NT-Pro BNP 57 07/13/2024: ALT 15; BUN 8; Creatinine, Ser 1.09; Hemoglobin 13.6; Magnesium  1.5; Platelets 121; Potassium 2.9; Sodium 138   Recent Lipid Panel Lab Results  Component Value Date/Time   CHOL 134 03/24/2024 10:18 AM   TRIG 85 03/24/2024 10:18 AM   HDL 51 03/24/2024 10:18 AM   CHOLHDL 2.6 03/24/2024 10:18 AM   CHOLHDL 3.9 05/31/2022 02:54 AM   LDLCALC 67 03/24/2024 10:18 AM    Wt Readings from Last 3 Encounters:  09/14/24 200 lb 6.4 oz (90.9 kg)  05/03/24 212 lb (96.2 kg)  03/24/24 206 lb 12.8 oz (93.8 kg)    EKG Interpretation Date/Time:  Wednesday September 14 2024 08:41:10 EST Ventricular Rate:  61 PR Interval:  106 QRS Duration:  94 QT Interval:  476 QTC Calculation: 479 R Axis:   83  Text  Interpretation: Sinus rhythm with short PR Nonspecific T wave abnormality Prolonged QT When compared with ECG of 13-Jul-2024 19:25, PREVIOUS ECG IS PRESENT Confirmed by Shlomo Corning ([id number]) on 09/14/2024 8:42:07 AM   Objective:    Vital Signs:  BP 104/60   Pulse 61   Ht 5' (1.524 m)   Wt 200 lb 6.4 oz (90.9 kg)   SpO2 96%   BMI 39.14 kg/m   GEN: Well nourished, well developed in no acute distress HEENT: Normal NECK: No JVD; No carotid bruits LYMPHATICS: No lymphadenopathy CARDIAC:RRR, no murmurs, rubs, gallops RESPIRATORY:  Clear to auscultation without rales, wheezing or rhonchi  ABDOMEN: Soft, non-tender, non-distended MUSCULOSKELETAL:  No edema; No deformity  SKIN: Warm and dry NEUROLOGIC:  Alert and oriented x 3 PSYCHIATRIC:  Normal affect   ASSESSMENT & PLAN:    1.  ASCAD -moderate nonobstructive CAD with 60-70% LCx by cath 2010 -denies any anginal symptoms -continue ASA 81mg  daily and simvastatin  20mg  daily with PRN refills  2.  HTN -BP controlled on exam today -continue Benazepril  40mg  daily, Clonidine  0.2mg  daily with PRN refills -I have personally reviewed and interpreted outside labs performed by patient's PCP which showed SCr 1.09 on 07/13/2024  3.  HLD -LDL goal < 70 -I have personally reviewed and interpreted outside labs performed by patient's PCP which showed LDL 67, HDL 51 on 03/24/2024 and ALT 15 on 07/13/2024 -continue simvastatin  20mg  daily with PRN refills  4.  Chronic LE edema -controlled on diuretics -continue Lasix  40mg  daily with PRN refills  5.  Prolonged QTc -mildly prolonged on EKG today -Mag was low at 1.5 in Oct>>repeat Mag and BMET today -I gave her a list of the QT prolonging drugs to avoid and encouraged her to avoid Zofran /phenergan   -I will send a note to her PCP and GI MD to avoid QT prolonging drugs   Medication Adjustments/Labs and Tests Ordered: Current medicines are reviewed at length with the patient today.  Concerns regarding  medicines are outlined above.  Tests Ordered: Orders Placed This Encounter  Procedures   EKG 12-Lead   Medication Changes: No orders of the defined types were placed in this encounter.   Disposition:  Follow up in 1 year(s)  Signed, Wilbert Bihari, MD  09/14/2024 8:56 AM    [facility] "

## 2024-09-14 ENCOUNTER — Encounter: Payer: Self-pay | Admitting: Cardiology

## 2024-09-14 ENCOUNTER — Ambulatory Visit: Admitting: Cardiology

## 2024-09-14 VITALS — BP 104/60 | HR 61 | Ht 60.0 in | Wt 200.4 lb

## 2024-09-14 DIAGNOSIS — Z79899 Other long term (current) drug therapy: Secondary | ICD-10-CM

## 2024-09-14 DIAGNOSIS — R600 Localized edema: Secondary | ICD-10-CM | POA: Diagnosis not present

## 2024-09-14 DIAGNOSIS — I4581 Long QT syndrome: Secondary | ICD-10-CM | POA: Diagnosis not present

## 2024-09-14 DIAGNOSIS — I2510 Atherosclerotic heart disease of native coronary artery without angina pectoris: Secondary | ICD-10-CM

## 2024-09-14 DIAGNOSIS — E785 Hyperlipidemia, unspecified: Secondary | ICD-10-CM

## 2024-09-14 DIAGNOSIS — I10 Essential (primary) hypertension: Secondary | ICD-10-CM

## 2024-09-14 DIAGNOSIS — E1169 Type 2 diabetes mellitus with other specified complication: Secondary | ICD-10-CM | POA: Diagnosis not present

## 2024-09-14 LAB — BASIC METABOLIC PANEL WITH GFR
BUN/Creatinine Ratio: 9 — ABNORMAL LOW (ref 12–28)
BUN: 8 mg/dL (ref 8–27)
CO2: 28 mmol/L (ref 20–29)
Calcium: 8.8 mg/dL (ref 8.7–10.3)
Chloride: 101 mmol/L (ref 96–106)
Creatinine, Ser: 0.87 mg/dL (ref 0.57–1.00)
Glucose: 82 mg/dL (ref 70–99)
Potassium: 3.8 mmol/L (ref 3.5–5.2)
Sodium: 139 mmol/L (ref 134–144)
eGFR: 74 mL/min/1.73

## 2024-09-14 LAB — MAGNESIUM: Magnesium: 1.4 mg/dL — ABNORMAL LOW (ref 1.6–2.3)

## 2024-09-14 NOTE — Addendum Note (Signed)
 Addended by: JANIT GENI CROME on: 09/14/2024 09:17 AM   Modules accepted: Orders

## 2024-09-14 NOTE — Patient Instructions (Signed)
 Medication Instructions:  Your physician recommends that you continue on your current medications as directed. Please refer to the Current Medication list given to you today.  *If you need a refill on your cardiac medications before your next appointment, please call your pharmacy*  Lab Work: Please complete a magnesium  and a BMET in our first floor lab today. You can also go to any [facility].  If you have labs (blood work) drawn today and your tests are completely normal, you will receive your results only by: MyChart Message (if you have MyChart) OR A paper copy in the mail If you have any lab test that is abnormal or we need to change your treatment, we will call you to review the results.  Testing/Procedures: None.  Follow-Up: At [facility], you and your health needs are our priority.  As part of our continuing mission to provide you with exceptional heart care, our providers are all part of one team.  This team includes your primary Cardiologist (physician) and Advanced Practice Providers or APPs (Physician Assistants and Nurse Practitioners) who all work together to provide you with the care you need, when you need it.  Your next appointment:   1 year(s)  Provider:   Wilbert Bihari, MD     Other Instructions Please go to [email or web address] for a list of QT prolonging meds.

## 2024-09-16 ENCOUNTER — Ambulatory Visit: Payer: Self-pay | Admitting: Cardiology

## 2024-09-16 DIAGNOSIS — Z79899 Other long term (current) drug therapy: Secondary | ICD-10-CM

## 2024-09-16 MED ORDER — MAGNESIUM OXIDE -MG SUPPLEMENT 400 (240 MG) MG PO TABS: 400.0000 mg | ORAL_TABLET | Freq: Every day | ORAL | 3 refills | Status: DC

## 2024-09-16 MED ORDER — MAGNESIUM OXIDE 400 MG PO TABS: 400.0000 mg | ORAL_TABLET | Freq: Every day | ORAL | 0 refills | Status: AC

## 2024-09-16 NOTE — Telephone Encounter (Signed)
-----   Message from Wilbert Bihari, MD sent at 09/16/2024  9:54 AM EST ----- Mag very low - start Mag oxide 400mg  daily and repeat Mag level in [redacted] week along with BMET

## 2024-09-16 NOTE — Telephone Encounter (Signed)
 Call to patient to advise mag was low on labs, Dr. Shlomo advises tostart Mag oxide 400mg  daily and repeat Mag level in [redacted] week along with BMET. Patient verbalizes understanding and agrees to plan. Orders placed.

## 2024-09-19 ENCOUNTER — Ambulatory Visit (HOSPITAL_COMMUNITY): Admitting: Anesthesiology

## 2024-09-19 ENCOUNTER — Encounter (HOSPITAL_COMMUNITY)
Admission: RE | Disposition: A | Discharge status: Home / Self Care | Payer: Self-pay | Source: Home / Self Care | Attending: Gastroenterology

## 2024-09-19 ENCOUNTER — Ambulatory Visit (HOSPITAL_COMMUNITY)
Admission: RE | Admit: 2024-09-19 | Discharge: 2024-09-19 | Disposition: A | Discharge status: Home / Self Care | Attending: Gastroenterology | Admitting: Gastroenterology

## 2024-09-19 ENCOUNTER — Encounter (HOSPITAL_COMMUNITY): Payer: Self-pay | Admitting: Gastroenterology

## 2024-09-19 ENCOUNTER — Other Ambulatory Visit: Payer: Self-pay

## 2024-09-19 DIAGNOSIS — K7460 Unspecified cirrhosis of liver: Secondary | ICD-10-CM | POA: Insufficient documentation

## 2024-09-19 DIAGNOSIS — Z79899 Other long term (current) drug therapy: Secondary | ICD-10-CM | POA: Insufficient documentation

## 2024-09-19 DIAGNOSIS — I2510 Atherosclerotic heart disease of native coronary artery without angina pectoris: Secondary | ICD-10-CM

## 2024-09-19 DIAGNOSIS — G4733 Obstructive sleep apnea (adult) (pediatric): Secondary | ICD-10-CM | POA: Diagnosis not present

## 2024-09-19 DIAGNOSIS — K31A19 Gastric intestinal metaplasia without dysplasia, unspecified site: Secondary | ICD-10-CM | POA: Diagnosis not present

## 2024-09-19 DIAGNOSIS — I110 Hypertensive heart disease with heart failure: Secondary | ICD-10-CM

## 2024-09-19 DIAGNOSIS — E1159 Type 2 diabetes mellitus with other circulatory complications: Secondary | ICD-10-CM | POA: Diagnosis not present

## 2024-09-19 DIAGNOSIS — Z87891 Personal history of nicotine dependence: Secondary | ICD-10-CM | POA: Insufficient documentation

## 2024-09-19 DIAGNOSIS — K2950 Unspecified chronic gastritis without bleeding: Secondary | ICD-10-CM | POA: Diagnosis not present

## 2024-09-19 DIAGNOSIS — G8929 Other chronic pain: Secondary | ICD-10-CM | POA: Diagnosis not present

## 2024-09-19 DIAGNOSIS — R1013 Epigastric pain: Secondary | ICD-10-CM | POA: Diagnosis present

## 2024-09-19 DIAGNOSIS — F32A Depression, unspecified: Secondary | ICD-10-CM | POA: Diagnosis not present

## 2024-09-19 DIAGNOSIS — Z7985 Long-term (current) use of injectable non-insulin antidiabetic drugs: Secondary | ICD-10-CM | POA: Insufficient documentation

## 2024-09-19 DIAGNOSIS — K2970 Gastritis, unspecified, without bleeding: Secondary | ICD-10-CM | POA: Diagnosis not present

## 2024-09-19 DIAGNOSIS — E785 Hyperlipidemia, unspecified: Secondary | ICD-10-CM | POA: Diagnosis not present

## 2024-09-19 DIAGNOSIS — Z6839 Body mass index (BMI) 39.0-39.9, adult: Secondary | ICD-10-CM | POA: Diagnosis not present

## 2024-09-19 DIAGNOSIS — E1169 Type 2 diabetes mellitus with other specified complication: Secondary | ICD-10-CM | POA: Insufficient documentation

## 2024-09-19 DIAGNOSIS — F419 Anxiety disorder, unspecified: Secondary | ICD-10-CM | POA: Insufficient documentation

## 2024-09-19 DIAGNOSIS — J4489 Other specified chronic obstructive pulmonary disease: Secondary | ICD-10-CM | POA: Insufficient documentation

## 2024-09-19 DIAGNOSIS — K219 Gastro-esophageal reflux disease without esophagitis: Secondary | ICD-10-CM | POA: Insufficient documentation

## 2024-09-19 DIAGNOSIS — I509 Heart failure, unspecified: Secondary | ICD-10-CM | POA: Diagnosis not present

## 2024-09-19 DIAGNOSIS — M1990 Unspecified osteoarthritis, unspecified site: Secondary | ICD-10-CM | POA: Diagnosis not present

## 2024-09-19 DIAGNOSIS — F1120 Opioid dependence, uncomplicated: Secondary | ICD-10-CM | POA: Insufficient documentation

## 2024-09-19 HISTORY — PX: ESOPHAGOGASTRODUODENOSCOPY: SHX5428

## 2024-09-19 HISTORY — PX: BIOPSY OF SKIN SUBCUTANEOUS TISSUE AND/OR MUCOUS MEMBRANE: SHX6741

## 2024-09-19 LAB — GLUCOSE, CAPILLARY: Glucose-Capillary: 100 mg/dL — ABNORMAL HIGH (ref 70–99)

## 2024-09-19 SURGERY — EGD (ESOPHAGOGASTRODUODENOSCOPY)
Anesthesia: Monitor Anesthesia Care

## 2024-09-19 MED ORDER — ONDANSETRON HCL 4 MG/2ML IJ SOLN
INTRAMUSCULAR | Status: DC | PRN
  Administered 2024-09-19: 4 mg via INTRAVENOUS

## 2024-09-19 MED ORDER — SODIUM CHLORIDE 0.9 % IV SOLN: INTRAVENOUS | Status: DC

## 2024-09-19 MED ORDER — PROPOFOL 500 MG/50ML IV EMUL
INTRAVENOUS | Status: DC | PRN
  Administered 2024-09-19: 30 mg via INTRAVENOUS
  Administered 2024-09-19: 150 ug/kg/min via INTRAVENOUS
  Administered 2024-09-19: 20 mg via INTRAVENOUS
  Administered 2024-09-19: 50 mg via INTRAVENOUS
  Administered 2024-09-19: 70 mg via INTRAVENOUS
  Administered 2024-09-19: 80 mg via INTRAVENOUS

## 2024-09-19 NOTE — Discharge Instructions (Signed)
YOU HAD AN ENDOSCOPIC PROCEDURE TODAY: Refer to the procedure report and other information in the discharge instructions given to you for any specific questions about what was found during the examination. If this information does not answer your questions, please call [facility] office at [phone] to clarify.   YOU SHOULD EXPECT: Some feelings of bloating in the abdomen. Passage of more gas than usual. Walking can help get rid of the air that was put into your GI tract during the procedure and reduce the bloating. If you had a lower endoscopy (such as a colonoscopy or flexible sigmoidoscopy) you may notice spotting of blood in your stool or on the toilet paper. Some abdominal soreness may be present for a day or two, also.  DIET: Your first meal following the procedure should be a light meal and then it is ok to progress to your normal diet. A half-sandwich or bowl of soup is an example of a good first meal. Heavy or fried foods are harder to digest and may make you feel nauseous or bloated. Drink plenty of fluids but you should avoid alcoholic beverages for 24 hours. If you had a esophageal dilation, please see attached instructions for diet.    ACTIVITY: Your care partner should take you home directly after the procedure. You should plan to take it easy, moving slowly for the rest of the day. You can resume normal activity the day after the procedure however YOU SHOULD NOT DRIVE, use power tools, machinery or perform tasks that involve climbing or major physical exertion for 24 hours (because of the sedation medicines used during the test).   SYMPTOMS TO REPORT IMMEDIATELY: A gastroenterologist can be reached at any hour. Please call [phone]  for any of the following symptoms:   Following upper endoscopy (EGD, EUS, ERCP, esophageal dilation) Vomiting of blood or coffee ground material  New, significant abdominal pain  New, significant chest pain or pain under the shoulder blades  Painful or  persistently difficult swallowing  New shortness of breath  Black, tarry-looking or red, bloody stools  FOLLOW UP:  If any biopsies were taken you will be contacted by phone or by letter within the next 1-3 weeks. Call [phone]  if you have not heard about the biopsies in 3 weeks.  Please also call with any specific questions about appointments or follow up tests.  

## 2024-09-19 NOTE — Anesthesia Postprocedure Evaluation (Signed)
"   Anesthesia Post Note  Patient: [name]  Procedure(s) Performed: EGD (ESOPHAGOGASTRODUODENOSCOPY) BIOPSY, SKIN, SUBCUTANEOUS TISSUE, OR MUCOUS MEMBRANE     Patient location during evaluation: PACU Anesthesia Type: MAC Level of consciousness: awake and alert and oriented Pain management: pain level controlled Vital Signs Assessment: post-procedure vital signs reviewed and stable Respiratory status: spontaneous breathing, nonlabored ventilation and respiratory function stable Cardiovascular status: stable and blood pressure returned to baseline Postop Assessment: no apparent nausea or vomiting Anesthetic complications: no   No notable events documented.  Last Vitals:  Vitals:   09/19/24 0950 09/19/24 1001  BP: 126/65 128/66  Pulse: 71 80  Resp: 20 (!) 24  Temp:    SpO2: 96% 97%    Last Pain:  Vitals:   09/19/24 0944  TempSrc: Oral  PainSc: 0-No pain                 Lidie Glade A.      "

## 2024-09-19 NOTE — Interval H&P Note (Signed)
 History and Physical Interval Note:  09/19/2024 9:03 AM  [name]  has presented today for surgery, with the diagnosis of Cirrhosis.  The various methods of treatment have been discussed with the patient and family. After consideration of risks, benefits and other options for treatment, the patient has consented to  Procedures: EGD (ESOPHAGOGASTRODUODENOSCOPY) (N/A) as a surgical intervention.  The patient's history has been reviewed, patient examined, no change in status, stable for surgery.  I have reviewed the patient's chart and labs.  Questions were answered to the patient's satisfaction.     Jerrell JAYSON Sol

## 2024-09-19 NOTE — Progress Notes (Signed)
 " Reason for Consultation:  Epigastric pain; Cirrhosis  HPI: [name] is a 64 y.o. female with intermittent epigastric pain that will last minutes to hours with recurrent nausea and sometimes vomiting. Reports dry heaves this morning. Denies heartburn. Denies alcohol . Not following a low sodium diet. EGD in 2024 showed portal hypertensive gastropathy without varices. Reports worsened abdominal pain today stating that she did not take her pain meds today because of this procedure. On Protonix  40 mg BID and recent trail of Sucralfate .  Past Medical History:  Diagnosis Date   Abdominal pain 01/12/2019   Abdominal pain, generalized 07/10/2014   Acute on chronic respiratory failure with hypoxia ([name]) 10/18/2018   Anxiety    Arthritis    Ascites 05/01/2019   NALD   Asthma    CAD (coronary artery disease)    cath 2010 with 60-70% left Cx and normal LVF   Chronic low back pain with left-sided sciatica 10/15/2015   Chronic pain    Chronic prescription benzodiazepine use 05/01/2019   Chronic, continuous use of opioids 05/01/2019   Cirrhosis of liver not due to alcohol  ([name]) 05/01/2019   Cirrhosis of liver: per CT 07/11/2014   Constipation by delayed colonic transit 12/09/2014   COPD (chronic obstructive pulmonary disease) ([name])    COPD exacerbation ([name]) 10/12/2016   Dehiscence of surgical wound left knee 03/16/2013   Depression    Depression with anxiety 01/11/2019   Deviated septum    Diabetes mellitus without complication ([name])    type 2   Generalized abdominal pain 12/09/2014   GERD (gastroesophageal reflux disease)    Headache(784.0)    migraines   Hepatic cirrhosis ([name]) 06/2014   HLD (hyperlipidemia) 01/11/2019   Hypercholesterolemia    Hypertension    Hypertension associated with diabetes ([name])    Ileus ([name]) 07/11/2014   Influenza A 10/2018   Lobar pneumonia    Microcytic anemia 01/11/2019   Migraine headache 12/09/2014   Morbid obesity ([name]) 03/09/2013   Obesity    OSA (obstructive  sleep apnea) 01/11/2019   Pars defect with spondylolisthesis 05/01/2019   Perforated bowel ([name])    PONV (postoperative nausea and vomiting)    Respiratory failure, acute-on-chronic ([name]) 12/11/2010   S/P left PF UKR 03/07/2013   Seizures ([name])    as a little girl 64 years old   Shortness of breath    Sleep apnea    dont wear bipap . lost weight   Spondylolisthesis, grade 2    Tobacco abuse    Type 2 diabetes mellitus with obesity 01/11/2019    Past Surgical History:  Procedure Laterality Date   ABDOMINAL EXPLORATION SURGERY     primary repair of colon perforation from [facility]   ABDOMINAL HYSTERECTOMY     total   BACK SURGERY     lumbar   COLONOSCOPY WITH PROPOFOL  N/A 10/04/2019   Procedure: COLONOSCOPY WITH PROPOFOL ;  Surgeon: Dianna Specking, MD;  Location: [facility];  Service: Endoscopy;  Laterality: N/A;   ESOPHAGOGASTRODUODENOSCOPY (EGD) WITH PROPOFOL  N/A 10/29/2016   Procedure: ESOPHAGOGASTRODUODENOSCOPY (EGD) WITH PROPOFOL ;  Surgeon: Lynwood Bohr, MD;  Location: [facility];  Service: Endoscopy;  Laterality: N/A;   ESOPHAGOGASTRODUODENOSCOPY (EGD) WITH PROPOFOL  N/A 10/04/2019   Procedure: ESOPHAGOGASTRODUODENOSCOPY (EGD) WITH PROPOFOL ;  Surgeon: Dianna Specking, MD;  Location: [facility];  Service: Endoscopy;  Laterality: N/A;   ESOPHAGOGASTRODUODENOSCOPY (EGD) WITH PROPOFOL  N/A 02/03/2023   Procedure: ESOPHAGOGASTRODUODENOSCOPY (EGD) WITH PROPOFOL ;  Surgeon: Dianna Specking, MD;  Location: [facility];  Service: Gastroenterology;  Laterality:  N/A;   flank ablation     IR PARACENTESIS  01/12/2019   IR PARACENTESIS  05/02/2019   IR PARACENTESIS  08/03/2019   IR PARACENTESIS  08/12/2022   IR PARACENTESIS  08/14/2022   IR PARACENTESIS  07/16/2023   IR PARACENTESIS  09/17/2023   IRRIGATION AND DEBRIDEMENT KNEE Left 03/14/2013   Procedure: IRRIGATION AND DEBRIDEMENT  and Closure of wound left KNEE;  Surgeon: Donnice JONETTA Car, MD;  Location: [facility];  Service: Orthopedics;   Laterality: Left;   PATELLA-FEMORAL ARTHROPLASTY Left 03/07/2013   Procedure: LEFT PATELLA-FEMORAL ARTHROPLASTY;  Surgeon: Donnice JONETTA Car, MD;  Location: [facility];  Service: Orthopedics;  Laterality: Left;   SAVORY DILATION N/A 10/29/2016   Procedure: SAVORY DILATION;  Surgeon: Lynwood Bohr, MD;  Location: [facility];  Service: Endoscopy;  Laterality: N/A;    Prior to Admission medications  Medication Sig Start Date End Date Taking? Authorizing Provider  acetaminophen  (TYLENOL ) 500 MG tablet Take 500-1,000 mg by mouth every 6 (six) hours as needed for mild pain (pain score 1-3) or headache.   Yes [provider]  albuterol  (PROVENTIL  HFA;VENTOLIN  HFA) 108 (90 BASE) MCG/ACT inhaler Inhale 2 puffs into the lungs every 6 (six) hours as needed for wheezing or shortness of breath.   Yes [provider]  albuterol  (PROVENTIL ) (2.5 MG/3ML) 0.083% nebulizer solution Take 2.5 mg by nebulization every 6 (six) hours as needed for wheezing or shortness of breath.   Yes [provider]  ALPRAZolam  (XANAX ) 1 MG tablet Take 1-2 mg by mouth See admin instructions. Take 2 mg by mouth at bedtime and an additional 1 mg once a day as needed for anxiety   Yes [provider]  amLODipine  (NORVASC ) 5 MG tablet Take 5 mg by mouth daily.   Yes [provider]  aspirin  EC 81 MG tablet Take 1 tablet (81 mg total) by mouth daily. Swallow whole. 03/28/24  Yes Williams, Evan, PA-C  azithromycin  (ZITHROMAX ) 250 MG tablet Take 250 mg by mouth daily.   Yes [provider]  baclofen  (LIORESAL ) 10 MG tablet Take 1 tablet (10 mg total) by mouth 3 (three) times daily. 12/07/22  Yes Tobie Yetta HERO, MD  benazepril  (LOTENSIN ) 40 MG tablet Take 40 mg by mouth daily.   Yes [provider]  Benzocaine-Resorcinol (VAGISIL EX) Apply 1 application topically daily.   Yes [provider]  bisacodyl  (DULCOLAX) 10 MG suppository Place 1 suppository (10 mg total) rectally daily  as needed for moderate constipation. 12/07/22  Yes Tobie Yetta HERO, MD  budesonide  (PULMICORT ) 1 MG/2ML nebulizer solution Take 1 mg by nebulization daily as needed (for shortness of breath- when sick). 04/08/21  Yes [provider]  cloNIDine  (CATAPRES ) 0.2 MG tablet Take 0.2 mg by mouth daily.   Yes [provider]  cyclobenzaprine  (FLEXERIL ) 10 MG tablet Take 10 mg by mouth 3 (three) times daily as needed for muscle spasms.   Yes [provider]  escitalopram  (LEXAPRO ) 20 MG tablet Take 20 mg by mouth daily.   Yes [provider]  estradiol  (ESTRACE ) 2 MG tablet Take 2 mg by mouth daily.    Yes [provider]  fluticasone  (FLONASE ) 50 MCG/ACT nasal spray Place 1 spray into both nostrils daily as needed for allergies.   Yes [provider]  furosemide  (LASIX ) 40 MG tablet Take 1 tablet (40 mg total) by mouth daily. Take additional 40 mg when you have a weight gain of 3 lbs in 1 day  or 5 lbs in 1 week. 12/08/22  Yes Tobie Yetta HERO, MD  Glycerin -Polysorbate 80 (REFRESH DRY EYE THERAPY OP) Place 1 drop into both eyes 3 (three) times daily as needed (for dryness).   Yes [provider]  glycopyrrolate  (ROBINUL ) 1 MG tablet Take 1-2 mg by mouth daily as needed (for excessive saliva).   Yes [provider]  lactulose  (CHRONULAC ) 10 GM/15ML solution Take 15 mLs by mouth 2 (two) times daily as needed for mild constipation. 10/22/22  Yes [provider]  levocetirizine (XYZAL ) 5 MG tablet Take 5 mg by mouth every evening.  09/16/19  Yes [provider]  lidocaine  (LIDODERM ) 5 % Place 1 patch onto the skin daily. Remove & Discard patch within 12 hours or as directed by MD 11/07/22  Yes Tegeler, Lonni PARAS, MD  lidocaine  (XYLOCAINE ) 5 % ointment Apply 1 application topically 2 (two) times daily as needed (hemorrhoids).   Yes [provider]  methylPREDNISolone  (MEDROL  DOSEPAK) 4 MG TBPK tablet See instructions 01/20/24   Yes Dreama Longs, MD  mupirocin ointment (BACTROBAN) 2 % Apply 1 Application topically 2 (two) times daily as needed (for sores in the nose). 10/27/22  Yes [provider]  nystatin  (MYCOSTATIN /NYSTOP ) powder Apply 1 g topically 4 (four) times daily as needed (irritation).   Yes [provider]  ondansetron  (ZOFRAN ) 8 MG tablet Take 8 mg by mouth every 8 (eight) hours as needed for nausea or vomiting.   Yes [provider]  ondansetron  (ZOFRAN -ODT) 4 MG disintegrating tablet Take 1 tablet (4 mg total) by mouth every 8 (eight) hours as needed for nausea or vomiting. 07/13/24  Yes Yolande Lamar BROCKS, MD  oxyCODONE  (ROXICODONE ) 15 MG immediate release tablet Take 1 tablet (15 mg total) by mouth every 8 (eight) hours as needed for pain. 03/29/21  Yes Krishnan, Sendil K, MD  Oxycodone  HCl 20 MG TABS Take 20 mg by mouth See admin instructions. Take 20 mg by mouth every 4 hours- max of 5 tablets/24 hours   Yes [provider]  pantoprazole  (PROTONIX ) 40 MG tablet Take 40 mg by mouth 2 (two) times daily before a meal.   Yes [provider]  prochlorperazine  (COMPAZINE ) 10 MG tablet Take 10 mg by mouth every 6 (six) hours as needed for nausea or vomiting (not relieved by Zofran ).   Yes [provider]  promethazine  (PHENERGAN ) 25 MG tablet Take 25 mg by mouth every 8 (eight) hours as needed for nausea or vomiting.   Yes [provider]  rizatriptan (MAXALT-MLT) 10 MG disintegrating tablet Take 10 mg by mouth as needed for migraine (may repeat once in 2 hours, if no relief- dissolve orally).   Yes [provider]  Simethicone  80 MG TABS Take 1 tablet (80 mg total) by mouth in the morning, at noon, in the evening, and at bedtime. 12/07/22  Yes Tobie Yetta HERO, MD  simvastatin  (ZOCOR ) 20 MG tablet Take 20 mg by mouth at bedtime.   Yes [provider]  topiramate  (TOPAMAX ) 50 MG tablet Take 50 mg by mouth daily as needed  (migraines).   Yes [provider]  traZODone  (DESYREL ) 50 MG tablet Take 100 mg by mouth at bedtime. 09/05/22  Yes [provider]  triamcinolone  cream (KENALOG ) 0.1 % Apply 1 application  topically 2 (two) times daily as needed (for rashes).   Yes [provider]  magnesium  oxide (MAG-OX) 400 MG tablet Take 1 tablet (400 mg total) by mouth daily. 09/16/24  Shlomo Wilbert SAUNDERS, MD  naloxone  (NARCAN ) 4 MG/0.1ML LIQD nasal spray kit Place 1 spray into the nose as needed (for overdose).     [provider]  OZEMPIC, 1 MG/DOSE, 4 MG/3ML SOPN Inject 1 mg into the skin every Monday.    [provider]  PHAZYME MAXIMUM STRENGTH 250 MG CAPS Take 250 mg by mouth 2 (two) times daily as needed (for gas).    [provider]  polyethylene glycol (MIRALAX  / GLYCOLAX ) 17 g packet Take 17 g by mouth 2 (two) times daily. Patient taking differently: Take 17 g by mouth daily as needed for moderate constipation (AND HOLD FOR DIARRHEA- mix as directed). 12/07/22   Tobie Yetta HERO, MD  potassium chloride  SA (KLOR-CON  M) 20 MEQ tablet Take 1 tablet (20 mEq total) by mouth 2 (two) times daily. 07/13/24   Yolande Lamar BROCKS, MD    Scheduled Meds: Continuous Infusions:  sodium chloride  20 mL/hr at 09/19/24 0841   PRN Meds:.  Allergies as of 08/31/2024 - Review Complete 07/13/2024  Allergen Reaction Noted   Spironolactone  Nausea Only 02/16/2020   Bactrim [sulfamethoxazole-trimethoprim] Hives and Other (See Comments) 08/15/2019   Gabapentin Other (See Comments) 08/15/2019   Amoxicillin  Nausea And Vomiting and Other (See Comments) 11/09/2011   Clindamycin/lincomycin Nausea And Vomiting 11/09/2011   Doxycycline  Nausea And Vomiting 11/09/2011   Influenza virus vaccine Other (See Comments) 01/29/2023   Linzess  [linaclotide ] Nausea And Vomiting 02/03/2023   Metronidazole  Nausea Only 01/29/2023   Treximet [sumatriptan-naproxen sodium] Nausea And Vomiting 11/09/2011     Family History  Problem Relation Age of Onset   Stroke Mother    Cancer - Lung Father    Heart attack Sister    Alcohol  abuse Sister    Hypertension Brother    Emphysema Paternal Grandfather    Breast cancer Maternal Aunt     Social History   Socioeconomic History   Marital status: Married    Spouse name: Not on file   Number of children: 1   Years of education: some coll.   Highest education level: Not on file  Occupational History   Occupation: disability  Tobacco Use   Smoking status: Former    Current packs/day: 0.00    Average packs/day: 2.0 packs/day for 46.0 years (92.0 ttl pk-yrs)    Types: E-cigarettes, Cigarettes    Start date: 52    Quit date: 2022    Years since quitting: 4.0   Smokeless tobacco: Current   Tobacco comments:    Patient uses vape  Vaping Use   Vaping status: Not on file  Substance and Sexual Activity   Alcohol  use: No   Drug use: No   Sexual activity: Never  Other Topics Concern   Not on file  Social History Narrative   Patient drinks very little caffeine .   Patient is right handed.    Social Drivers of Health   Tobacco Use: High Risk (09/19/2024)   Patient History    Smoking Tobacco Use: Former    Smokeless Tobacco Use: Current    Passive Exposure: Not on Actuary Strain: Not on file  Food Insecurity: No Food Insecurity (10/26/2023)   Hunger Vital Sign    Worried About Running Out of Food in the Last Year: Never true    Ran Out of Food in the Last Year: Never true  Transportation Needs: Patient Declined (10/25/2023)   PRAPARE - Administrator, Civil Service (Medical): Patient declined  Lack of Transportation (Non-Medical): Patient declined  Physical Activity: Not on file  Stress: Not on file  Social Connections: Unknown (01/27/2022)   Received from [facility]   Social Network    Social Network: Not on file  Intimate Partner Violence: Patient Declined (10/25/2023)   Humiliation, Afraid, Rape,  and Kick questionnaire    Fear of Current or Ex-Partner: Patient declined    Emotionally Abused: Patient declined    Physically Abused: Patient declined    Sexually Abused: Patient declined  Depression (PHQ2-9): Not on file  Alcohol  Screen: Not on file  Housing: Low Risk (10/26/2023)   Housing Stability Vital Sign    Unable to Pay for Housing in the Last Year: No    Number of Times Moved in the Last Year: 0    Homeless in the Last Year: No  Utilities: Not At Risk (10/25/2023)   [employer] Utilities    Threatened with loss of utilities: No  Health Literacy: Not on file    Review of Systems: All negative except as stated above in HPI.  Physical Exam: Vital signs: Vitals:   09/19/24 0818  BP: 122/68  Pulse: 70  Resp: 18  Temp: 97.9 F (36.6 C)  SpO2: 92%     General:  Mild acute distress, obese, alert   Head: normocephalic, atraumatic Eyes: anicteric sclera ENT: oropharynx clear Neck: supple, nontender Lungs:  Clear throughout to auscultation.   No wheezes, crackles, or rhonchi. No acute distress. Heart:  Regular rate and rhythm; no murmurs, clicks, rubs,  or gallops. Abdomen: diffuse tenderness with guarding, soft, nondistended, +BS, obese  Rectal:  Deferred Ext: no edema  GI:  Lab Results: No results for input(s): WBC, HGB, HCT, PLT in the last 72 hours. BMET No results for input(s): NA, K, CL, CO2, GLUCOSE, BUN, CREATININE, CALCIUM in the last 72 hours. LFT No results for input(s): PROT, ALBUMIN , AST, ALT, ALKPHOS, BILITOT, BILIDIR, IBILI in the last 72 hours. PT/INR No results for input(s): LABPROT, INR in the last 72 hours.   Studies/Results: No results found.  Impression/Plan: Cirrhosis with recurrent epigastric pain and nausea in need of an updated EGD. Low sodium diet. Continue alcohol  abstinence.    LOS: 0 days   Jerrell JAYSON Sol  09/19/2024, 8:57 AM  Questions please call [phone] ID: [name],  female   DOB: 09/10/1961, 64 y.o.   MRN: [id number]  "

## 2024-09-19 NOTE — H&P (View-Only) (Signed)
 " Reason for Consultation:  Epigastric pain; Cirrhosis  HPI: [name] is a 64 y.o. female with intermittent epigastric pain that will last minutes to hours with recurrent nausea and sometimes vomiting. Reports dry heaves this morning. Denies heartburn. Denies alcohol . Not following a low sodium diet. EGD in 2024 showed portal hypertensive gastropathy without varices. Reports worsened abdominal pain today stating that she did not take her pain meds today because of this procedure. On Protonix  40 mg BID and recent trail of Sucralfate .  Past Medical History:  Diagnosis Date   Abdominal pain 01/12/2019   Abdominal pain, generalized 07/10/2014   Acute on chronic respiratory failure with hypoxia ([name]) 10/18/2018   Anxiety    Arthritis    Ascites 05/01/2019   NALD   Asthma    CAD (coronary artery disease)    cath 2010 with 60-70% left Cx and normal LVF   Chronic low back pain with left-sided sciatica 10/15/2015   Chronic pain    Chronic prescription benzodiazepine use 05/01/2019   Chronic, continuous use of opioids 05/01/2019   Cirrhosis of liver not due to alcohol  ([name]) 05/01/2019   Cirrhosis of liver: per CT 07/11/2014   Constipation by delayed colonic transit 12/09/2014   COPD (chronic obstructive pulmonary disease) ([name])    COPD exacerbation ([name]) 10/12/2016   Dehiscence of surgical wound left knee 03/16/2013   Depression    Depression with anxiety 01/11/2019   Deviated septum    Diabetes mellitus without complication ([name])    type 2   Generalized abdominal pain 12/09/2014   GERD (gastroesophageal reflux disease)    Headache(784.0)    migraines   Hepatic cirrhosis ([name]) 06/2014   HLD (hyperlipidemia) 01/11/2019   Hypercholesterolemia    Hypertension    Hypertension associated with diabetes ([name])    Ileus ([name]) 07/11/2014   Influenza A 10/2018   Lobar pneumonia    Microcytic anemia 01/11/2019   Migraine headache 12/09/2014   Morbid obesity ([name]) 03/09/2013   Obesity    OSA (obstructive  sleep apnea) 01/11/2019   Pars defect with spondylolisthesis 05/01/2019   Perforated bowel ([name])    PONV (postoperative nausea and vomiting)    Respiratory failure, acute-on-chronic ([name]) 12/11/2010   S/P left PF UKR 03/07/2013   Seizures ([name])    as a little girl 64 years old   Shortness of breath    Sleep apnea    dont wear bipap . lost weight   Spondylolisthesis, grade 2    Tobacco abuse    Type 2 diabetes mellitus with obesity 01/11/2019    Past Surgical History:  Procedure Laterality Date   ABDOMINAL EXPLORATION SURGERY     primary repair of colon perforation from [facility]   ABDOMINAL HYSTERECTOMY     total   BACK SURGERY     lumbar   COLONOSCOPY WITH PROPOFOL  N/A 10/04/2019   Procedure: COLONOSCOPY WITH PROPOFOL ;  Surgeon: Dianna Specking, MD;  Location: [facility];  Service: Endoscopy;  Laterality: N/A;   ESOPHAGOGASTRODUODENOSCOPY (EGD) WITH PROPOFOL  N/A 10/29/2016   Procedure: ESOPHAGOGASTRODUODENOSCOPY (EGD) WITH PROPOFOL ;  Surgeon: Lynwood Bohr, MD;  Location: [facility];  Service: Endoscopy;  Laterality: N/A;   ESOPHAGOGASTRODUODENOSCOPY (EGD) WITH PROPOFOL  N/A 10/04/2019   Procedure: ESOPHAGOGASTRODUODENOSCOPY (EGD) WITH PROPOFOL ;  Surgeon: Dianna Specking, MD;  Location: [facility];  Service: Endoscopy;  Laterality: N/A;   ESOPHAGOGASTRODUODENOSCOPY (EGD) WITH PROPOFOL  N/A 02/03/2023   Procedure: ESOPHAGOGASTRODUODENOSCOPY (EGD) WITH PROPOFOL ;  Surgeon: Dianna Specking, MD;  Location: [facility];  Service: Gastroenterology;  Laterality:  N/A;   flank ablation     IR PARACENTESIS  01/12/2019   IR PARACENTESIS  05/02/2019   IR PARACENTESIS  08/03/2019   IR PARACENTESIS  08/12/2022   IR PARACENTESIS  08/14/2022   IR PARACENTESIS  07/16/2023   IR PARACENTESIS  09/17/2023   IRRIGATION AND DEBRIDEMENT KNEE Left 03/14/2013   Procedure: IRRIGATION AND DEBRIDEMENT  and Closure of wound left KNEE;  Surgeon: Donnice JONETTA Car, MD;  Location: [facility];  Service: Orthopedics;   Laterality: Left;   PATELLA-FEMORAL ARTHROPLASTY Left 03/07/2013   Procedure: LEFT PATELLA-FEMORAL ARTHROPLASTY;  Surgeon: Donnice JONETTA Car, MD;  Location: [facility];  Service: Orthopedics;  Laterality: Left;   SAVORY DILATION N/A 10/29/2016   Procedure: SAVORY DILATION;  Surgeon: Lynwood Bohr, MD;  Location: [facility];  Service: Endoscopy;  Laterality: N/A;    Prior to Admission medications  Medication Sig Start Date End Date Taking? Authorizing Provider  acetaminophen  (TYLENOL ) 500 MG tablet Take 500-1,000 mg by mouth every 6 (six) hours as needed for mild pain (pain score 1-3) or headache.   Yes [provider]  albuterol  (PROVENTIL  HFA;VENTOLIN  HFA) 108 (90 BASE) MCG/ACT inhaler Inhale 2 puffs into the lungs every 6 (six) hours as needed for wheezing or shortness of breath.   Yes [provider]  albuterol  (PROVENTIL ) (2.5 MG/3ML) 0.083% nebulizer solution Take 2.5 mg by nebulization every 6 (six) hours as needed for wheezing or shortness of breath.   Yes [provider]  ALPRAZolam  (XANAX ) 1 MG tablet Take 1-2 mg by mouth See admin instructions. Take 2 mg by mouth at bedtime and an additional 1 mg once a day as needed for anxiety   Yes [provider]  amLODipine  (NORVASC ) 5 MG tablet Take 5 mg by mouth daily.   Yes [provider]  aspirin  EC 81 MG tablet Take 1 tablet (81 mg total) by mouth daily. Swallow whole. 03/28/24  Yes Williams, Evan, PA-C  azithromycin  (ZITHROMAX ) 250 MG tablet Take 250 mg by mouth daily.   Yes [provider]  baclofen  (LIORESAL ) 10 MG tablet Take 1 tablet (10 mg total) by mouth 3 (three) times daily. 12/07/22  Yes Tobie Yetta HERO, MD  benazepril  (LOTENSIN ) 40 MG tablet Take 40 mg by mouth daily.   Yes [provider]  Benzocaine-Resorcinol (VAGISIL EX) Apply 1 application topically daily.   Yes [provider]  bisacodyl  (DULCOLAX) 10 MG suppository Place 1 suppository (10 mg total) rectally daily  as needed for moderate constipation. 12/07/22  Yes Tobie Yetta HERO, MD  budesonide  (PULMICORT ) 1 MG/2ML nebulizer solution Take 1 mg by nebulization daily as needed (for shortness of breath- when sick). 04/08/21  Yes [provider]  cloNIDine  (CATAPRES ) 0.2 MG tablet Take 0.2 mg by mouth daily.   Yes [provider]  cyclobenzaprine  (FLEXERIL ) 10 MG tablet Take 10 mg by mouth 3 (three) times daily as needed for muscle spasms.   Yes [provider]  escitalopram  (LEXAPRO ) 20 MG tablet Take 20 mg by mouth daily.   Yes [provider]  estradiol  (ESTRACE ) 2 MG tablet Take 2 mg by mouth daily.    Yes [provider]  fluticasone  (FLONASE ) 50 MCG/ACT nasal spray Place 1 spray into both nostrils daily as needed for allergies.   Yes [provider]  furosemide  (LASIX ) 40 MG tablet Take 1 tablet (40 mg total) by mouth daily. Take additional 40 mg when you have a weight gain of 3 lbs in 1 day  or 5 lbs in 1 week. 12/08/22  Yes Tobie Yetta HERO, MD  Glycerin -Polysorbate 80 (REFRESH DRY EYE THERAPY OP) Place 1 drop into both eyes 3 (three) times daily as needed (for dryness).   Yes [provider]  glycopyrrolate  (ROBINUL ) 1 MG tablet Take 1-2 mg by mouth daily as needed (for excessive saliva).   Yes [provider]  lactulose  (CHRONULAC ) 10 GM/15ML solution Take 15 mLs by mouth 2 (two) times daily as needed for mild constipation. 10/22/22  Yes [provider]  levocetirizine (XYZAL ) 5 MG tablet Take 5 mg by mouth every evening.  09/16/19  Yes [provider]  lidocaine  (LIDODERM ) 5 % Place 1 patch onto the skin daily. Remove & Discard patch within 12 hours or as directed by MD 11/07/22  Yes Tegeler, Lonni PARAS, MD  lidocaine  (XYLOCAINE ) 5 % ointment Apply 1 application topically 2 (two) times daily as needed (hemorrhoids).   Yes [provider]  methylPREDNISolone  (MEDROL  DOSEPAK) 4 MG TBPK tablet See instructions 01/20/24   Yes Dreama Longs, MD  mupirocin ointment (BACTROBAN) 2 % Apply 1 Application topically 2 (two) times daily as needed (for sores in the nose). 10/27/22  Yes [provider]  nystatin  (MYCOSTATIN /NYSTOP ) powder Apply 1 g topically 4 (four) times daily as needed (irritation).   Yes [provider]  ondansetron  (ZOFRAN ) 8 MG tablet Take 8 mg by mouth every 8 (eight) hours as needed for nausea or vomiting.   Yes [provider]  ondansetron  (ZOFRAN -ODT) 4 MG disintegrating tablet Take 1 tablet (4 mg total) by mouth every 8 (eight) hours as needed for nausea or vomiting. 07/13/24  Yes Yolande Lamar BROCKS, MD  oxyCODONE  (ROXICODONE ) 15 MG immediate release tablet Take 1 tablet (15 mg total) by mouth every 8 (eight) hours as needed for pain. 03/29/21  Yes Krishnan, Sendil K, MD  Oxycodone  HCl 20 MG TABS Take 20 mg by mouth See admin instructions. Take 20 mg by mouth every 4 hours- max of 5 tablets/24 hours   Yes [provider]  pantoprazole  (PROTONIX ) 40 MG tablet Take 40 mg by mouth 2 (two) times daily before a meal.   Yes [provider]  prochlorperazine  (COMPAZINE ) 10 MG tablet Take 10 mg by mouth every 6 (six) hours as needed for nausea or vomiting (not relieved by Zofran ).   Yes [provider]  promethazine  (PHENERGAN ) 25 MG tablet Take 25 mg by mouth every 8 (eight) hours as needed for nausea or vomiting.   Yes [provider]  rizatriptan (MAXALT-MLT) 10 MG disintegrating tablet Take 10 mg by mouth as needed for migraine (may repeat once in 2 hours, if no relief- dissolve orally).   Yes [provider]  Simethicone  80 MG TABS Take 1 tablet (80 mg total) by mouth in the morning, at noon, in the evening, and at bedtime. 12/07/22  Yes Tobie Yetta HERO, MD  simvastatin  (ZOCOR ) 20 MG tablet Take 20 mg by mouth at bedtime.   Yes [provider]  topiramate  (TOPAMAX ) 50 MG tablet Take 50 mg by mouth daily as needed  (migraines).   Yes [provider]  traZODone  (DESYREL ) 50 MG tablet Take 100 mg by mouth at bedtime. 09/05/22  Yes [provider]  triamcinolone  cream (KENALOG ) 0.1 % Apply 1 application  topically 2 (two) times daily as needed (for rashes).   Yes [provider]  magnesium  oxide (MAG-OX) 400 MG tablet Take 1 tablet (400 mg total) by mouth daily. 09/16/24  Shlomo Wilbert SAUNDERS, MD  naloxone  (NARCAN ) 4 MG/0.1ML LIQD nasal spray kit Place 1 spray into the nose as needed (for overdose).     [provider]  OZEMPIC, 1 MG/DOSE, 4 MG/3ML SOPN Inject 1 mg into the skin every Monday.    [provider]  PHAZYME MAXIMUM STRENGTH 250 MG CAPS Take 250 mg by mouth 2 (two) times daily as needed (for gas).    [provider]  polyethylene glycol (MIRALAX  / GLYCOLAX ) 17 g packet Take 17 g by mouth 2 (two) times daily. Patient taking differently: Take 17 g by mouth daily as needed for moderate constipation (AND HOLD FOR DIARRHEA- mix as directed). 12/07/22   Tobie Yetta HERO, MD  potassium chloride  SA (KLOR-CON  M) 20 MEQ tablet Take 1 tablet (20 mEq total) by mouth 2 (two) times daily. 07/13/24   Yolande Lamar BROCKS, MD    Scheduled Meds: Continuous Infusions:  sodium chloride  20 mL/hr at 09/19/24 0841   PRN Meds:.  Allergies as of 08/31/2024 - Review Complete 07/13/2024  Allergen Reaction Noted   Spironolactone  Nausea Only 02/16/2020   Bactrim [sulfamethoxazole-trimethoprim] Hives and Other (See Comments) 08/15/2019   Gabapentin Other (See Comments) 08/15/2019   Amoxicillin  Nausea And Vomiting and Other (See Comments) 11/09/2011   Clindamycin/lincomycin Nausea And Vomiting 11/09/2011   Doxycycline  Nausea And Vomiting 11/09/2011   Influenza virus vaccine Other (See Comments) 01/29/2023   Linzess  [linaclotide ] Nausea And Vomiting 02/03/2023   Metronidazole  Nausea Only 01/29/2023   Treximet [sumatriptan-naproxen sodium] Nausea And Vomiting 11/09/2011     Family History  Problem Relation Age of Onset   Stroke Mother    Cancer - Lung Father    Heart attack Sister    Alcohol  abuse Sister    Hypertension Brother    Emphysema Paternal Grandfather    Breast cancer Maternal Aunt     Social History   Socioeconomic History   Marital status: Married    Spouse name: Not on file   Number of children: 1   Years of education: some coll.   Highest education level: Not on file  Occupational History   Occupation: disability  Tobacco Use   Smoking status: Former    Current packs/day: 0.00    Average packs/day: 2.0 packs/day for 46.0 years (92.0 ttl pk-yrs)    Types: E-cigarettes, Cigarettes    Start date: 52    Quit date: 2022    Years since quitting: 4.0   Smokeless tobacco: Current   Tobacco comments:    Patient uses vape  Vaping Use   Vaping status: Not on file  Substance and Sexual Activity   Alcohol  use: No   Drug use: No   Sexual activity: Never  Other Topics Concern   Not on file  Social History Narrative   Patient drinks very little caffeine .   Patient is right handed.    Social Drivers of Health   Tobacco Use: High Risk (09/19/2024)   Patient History    Smoking Tobacco Use: Former    Smokeless Tobacco Use: Current    Passive Exposure: Not on Actuary Strain: Not on file  Food Insecurity: No Food Insecurity (10/26/2023)   Hunger Vital Sign    Worried About Running Out of Food in the Last Year: Never true    Ran Out of Food in the Last Year: Never true  Transportation Needs: Patient Declined (10/25/2023)   PRAPARE - Administrator, Civil Service (Medical): Patient declined  Lack of Transportation (Non-Medical): Patient declined  Physical Activity: Not on file  Stress: Not on file  Social Connections: Unknown (01/27/2022)   Received from [facility]   Social Network    Social Network: Not on file  Intimate Partner Violence: Patient Declined (10/25/2023)   Humiliation, Afraid, Rape,  and Kick questionnaire    Fear of Current or Ex-Partner: Patient declined    Emotionally Abused: Patient declined    Physically Abused: Patient declined    Sexually Abused: Patient declined  Depression (PHQ2-9): Not on file  Alcohol  Screen: Not on file  Housing: Low Risk (10/26/2023)   Housing Stability Vital Sign    Unable to Pay for Housing in the Last Year: No    Number of Times Moved in the Last Year: 0    Homeless in the Last Year: No  Utilities: Not At Risk (10/25/2023)   AHC Utilities    Threatened with loss of utilities: No  Health Literacy: Not on file    Review of Systems: All negative except as stated above in HPI.  Physical Exam: Vital signs: Vitals:   09/19/24 0818  BP: 122/68  Pulse: 70  Resp: 18  Temp: 97.9 F (36.6 C)  SpO2: 92%     General:  Mild acute distress, obese, alert   Head: normocephalic, atraumatic Eyes: anicteric sclera ENT: oropharynx clear Neck: supple, nontender Lungs:  Clear throughout to auscultation.   No wheezes, crackles, or rhonchi. No acute distress. Heart:  Regular rate and rhythm; no murmurs, clicks, rubs,  or gallops. Abdomen: diffuse tenderness with guarding, soft, nondistended, +BS, obese  Rectal:  Deferred Ext: no edema  GI:  Lab Results: No results for input(s): WBC, HGB, HCT, PLT in the last 72 hours. BMET No results for input(s): NA, K, CL, CO2, GLUCOSE, BUN, CREATININE, CALCIUM in the last 72 hours. LFT No results for input(s): PROT, ALBUMIN , AST, ALT, ALKPHOS, BILITOT, BILIDIR, IBILI in the last 72 hours. PT/INR No results for input(s): LABPROT, INR in the last 72 hours.   Studies/Results: No results found.  Impression/Plan: Cirrhosis with recurrent epigastric pain and nausea in need of an updated EGD. Low sodium diet. Continue alcohol  abstinence.    LOS: 0 days   Jerrell JAYSON Sol  09/19/2024, 8:57 AM  Questions please call [phone] ID: Erie CHRISTELLA Lerner,  female   DOB: 09/10/1961, 64 y.o.   MRN: [id number]  "

## 2024-09-19 NOTE — Anesthesia Preprocedure Evaluation (Addendum)
"                                    Anesthesia Evaluation  Patient identified by MRN, date of birth, ID band Patient awake    Reviewed: Allergy & Precautions, NPO status , Patient's Chart, lab work & pertinent test results, reviewed documented beta blocker date and time   History of Anesthesia Complications (+) PONV and history of anesthetic complications  Airway Mallampati: II  TM Distance: >3 FB     Dental  (+) Edentulous Upper, Edentulous Lower   Pulmonary shortness of breath and with exertion, asthma , sleep apnea and Continuous Positive Airway Pressure Ventilation , pneumonia, resolved, COPD,  COPD inhaler, Patient abstained from smoking., former smoker   breath sounds clear to auscultation + decreased breath sounds      Cardiovascular hypertension, Pt. on medications + CAD and +CHF  Normal cardiovascular exam Rhythm:Regular Rate:Normal  Cardiac cath 2010 with 60-70% left Cx and normal LVF   Neuro/Psych  Headaches, Seizures -, Well Controlled,  PSYCHIATRIC DISORDERS Anxiety Depression     Neuromuscular disease    GI/Hepatic ,GERD  Medicated,,(+) Cirrhosis   ascites  substance abuse  NAFLD Hx/o Chronic pain Hx/o chronic opioid and benzodiazepine use Hx/o esophageal stricture   Endo/Other  diabetes, Well Controlled, Type 2  GLP-1 RA therapy- last dose 12/29 HLD  Renal/GU Renal disease  negative genitourinary   Musculoskeletal  (+) Arthritis , Osteoarthritis,  narcotic dependentDDD lumbar spine Left sciatica   Abdominal  (+) + obese  Peds  Hematology  (+) Blood dyscrasia, anemia   Anesthesia Other Findings   Reproductive/Obstetrics                              Anesthesia Physical Anesthesia Plan  ASA: 3  Anesthesia Plan: MAC   Post-op Pain Management: Minimal or no pain anticipated   Induction: Intravenous  PONV Risk Score and Plan: 3 and Treatment may vary due to age or medical condition and Propofol   infusion  Airway Management Planned: Natural Airway and Simple Face Mask  Additional Equipment: None  Intra-op Plan:   Post-operative Plan:   Informed Consent: I have reviewed the patients History and Physical, chart, labs and discussed the procedure including the risks, benefits and alternatives for the proposed anesthesia with the patient or authorized representative who has indicated his/her understanding and acceptance.       Plan Discussed with: Anesthesiologist and CRNA  Anesthesia Plan Comments:          Anesthesia Quick Evaluation  "

## 2024-09-19 NOTE — Transfer of Care (Signed)
 Immediate Anesthesia Transfer of Care Note  Patient: [name]  Procedure(s) Performed: EGD (ESOPHAGOGASTRODUODENOSCOPY) BIOPSY, SKIN, SUBCUTANEOUS TISSUE, OR MUCOUS MEMBRANE  Patient Location: PACU and Endoscopy Unit  Anesthesia Type:MAC  Level of Consciousness: awake  Airway & Oxygen  Therapy: Patient Spontanous Breathing and Patient connected to face mask oxygen   Post-op Assessment: Report given to RN and Post -op Vital signs reviewed and stable  Post vital signs: Reviewed and stable  Last Vitals:  Vitals Value Taken Time  BP 133/71 09/19/24 09:44  Temp 36.8 C 09/19/24 09:44  Pulse 67 09/19/24 09:45  Resp 23 09/19/24 09:45  SpO2 99 % 09/19/24 09:45  Vitals shown include unfiled device data.  Last Pain:  Vitals:   09/19/24 0944  TempSrc: Oral  PainSc:          Complications: No notable events documented.

## 2024-09-19 NOTE — Op Note (Signed)
 [facility] Patient Name: [name] Procedure Date : 09/19/2024 MRN: [id number] Attending MD: Jerrell JAYSON Sol , MD, [id number] Date of Birth: 1961/04/15 CSN: [id number] Age: 64 Admit Type: Inpatient Procedure:                Upper GI endoscopy Indications:              Epigastric abdominal pain, Cirrhosis Providers:                Jerrell KYM Sol, MD, Ozell Pouch, Curtistine Bishop, Technician Referring MD:             Elsie Lesches Medicines:                Propofol  per Anesthesia, Monitored Anesthesia Care Complications:            No immediate complications. Estimated Blood Loss:     Estimated blood loss was minimal. Procedure:                Pre-Anesthesia Assessment:                           - Prior to the procedure, a History and Physical                            was performed, and patient medications and                            allergies were reviewed. The patient's tolerance of                            previous anesthesia was also reviewed. The risks                            and benefits of the procedure and the sedation                            options and risks were discussed with the patient.                            All questions were answered, and informed consent                            was obtained. Prior Anticoagulants: The patient has                            taken no anticoagulant or antiplatelet agents. ASA                            Grade Assessment: III - A patient with severe                            systemic disease. After reviewing the risks and  benefits, the patient was deemed in satisfactory                            condition to undergo the procedure.                           After obtaining informed consent, the endoscope was                            passed under direct vision. Throughout the                            procedure, the patient's blood  pressure, pulse, and                            oxygen  saturations were monitored continuously. The                            [id number]-[id number] ([id number]) Olympus endoscope was introduced                            through the mouth, and advanced to the second part                            of duodenum. The upper GI endoscopy was                            accomplished without difficulty. The patient                            tolerated the procedure well. Scope In: Scope Out: Findings:      The examined esophagus was normal.      The Z-line was regular and was found 38 cm from the incisors.      Segmental moderate inflammation characterized by congestion (edema) and       erythema was found in the gastric antrum. Biopsies were taken with a       cold forceps for histology. Estimated blood loss was minimal.      The cardia and gastric fundus were normal on retroflexion.      The examined duodenum was normal. Impression:               - Normal esophagus.                           - Z-line regular, 38 cm from the incisors.                           - Gastritis. Biopsied.                           - Normal examined duodenum. Recommendation:           - Patient has a contact number available for                            emergencies. The  signs and symptoms of potential                            delayed complications were discussed with the                            patient. Return to normal activities tomorrow.                            Written discharge instructions were provided to the                            patient.                           - Low sodium diet.                           - Await pathology results. Procedure Code(s):        --- Professional ---                           [phone], Esophagogastroduodenoscopy, flexible,                            transoral; with biopsy, single or multiple Diagnosis Code(s):        --- Professional ---                           R10.13,  Epigastric pain                           K29.70, Gastritis, unspecified, without bleeding CPT copyright 2022 [employer]. All rights reserved. The codes documented in this report are preliminary and upon coder review may  be revised to meet current compliance requirements. Jerrell JAYSON Sol, MD 09/19/2024 9:43:05 AM This report has been signed electronically. Number of Addenda: 0

## 2024-09-20 ENCOUNTER — Encounter (HOSPITAL_COMMUNITY): Payer: Self-pay | Admitting: Gastroenterology

## 2024-09-21 LAB — SURGICAL PATHOLOGY
